# Patient Record
Sex: Male | Born: 1935 | Race: White | Hispanic: No | Smoking: Former smoker
Health system: Southern US, Community
[De-identification: ages and names within clinical notes are randomized; demographics above are authoritative.]

## PROBLEM LIST (undated history)

## (undated) DIAGNOSIS — I739 Peripheral vascular disease, unspecified: Secondary | ICD-10-CM

## (undated) DIAGNOSIS — I25810 Atherosclerosis of coronary artery bypass graft(s) without angina pectoris: Secondary | ICD-10-CM

## (undated) DIAGNOSIS — Z973 Presence of spectacles and contact lenses: Secondary | ICD-10-CM

## (undated) DIAGNOSIS — E7800 Pure hypercholesterolemia, unspecified: Secondary | ICD-10-CM

## (undated) DIAGNOSIS — I779 Disorder of arteries and arterioles, unspecified: Secondary | ICD-10-CM

## (undated) DIAGNOSIS — I214 Non-ST elevation (NSTEMI) myocardial infarction: Secondary | ICD-10-CM

## (undated) DIAGNOSIS — I10 Essential (primary) hypertension: Secondary | ICD-10-CM

## (undated) DIAGNOSIS — M1990 Unspecified osteoarthritis, unspecified site: Secondary | ICD-10-CM

## (undated) DIAGNOSIS — D649 Anemia, unspecified: Secondary | ICD-10-CM

## (undated) DIAGNOSIS — Z972 Presence of dental prosthetic device (complete) (partial): Secondary | ICD-10-CM

## (undated) DIAGNOSIS — E119 Type 2 diabetes mellitus without complications: Secondary | ICD-10-CM

## (undated) DIAGNOSIS — N289 Disorder of kidney and ureter, unspecified: Secondary | ICD-10-CM

## (undated) DIAGNOSIS — Z862 Personal history of diseases of the blood and blood-forming organs and certain disorders involving the immune mechanism: Secondary | ICD-10-CM

## (undated) HISTORY — PX: COLONOSCOPY: SHX174

## (undated) HISTORY — DX: Peripheral vascular disease, unspecified: I73.9

## (undated) HISTORY — PX: OTHER SURGICAL HISTORY: SHX169

## (undated) HISTORY — DX: Personal history of diseases of the blood and blood-forming organs and certain disorders involving the immune mechanism: Z86.2

## (undated) HISTORY — PX: MULTIPLE TOOTH EXTRACTIONS: SHX2053

## (undated) HISTORY — DX: Pure hypercholesterolemia, unspecified: E78.00

## (undated) HISTORY — DX: Disorder of arteries and arterioles, unspecified: I77.9

## (undated) HISTORY — DX: Unspecified osteoarthritis, unspecified site: M19.90

## (undated) HISTORY — DX: Disorder of kidney and ureter, unspecified: N28.9

## (undated) HISTORY — PX: TONSILLECTOMY: SUR1361

## (undated) HISTORY — DX: Essential (primary) hypertension: I10

## (undated) HISTORY — DX: Atherosclerosis of coronary artery bypass graft(s) without angina pectoris: I25.810

## (undated) HISTORY — DX: Non-ST elevation (NSTEMI) myocardial infarction: I21.4

---

## 1998-02-26 HISTORY — PX: CORONARY ARTERY BYPASS GRAFT: SHX141

## 1998-09-14 ENCOUNTER — Encounter: Payer: Self-pay | Admitting: *Deleted

## 1998-09-15 ENCOUNTER — Inpatient Hospital Stay (HOSPITAL_COMMUNITY): Admission: AD | Admit: 1998-09-15 | Discharge: 1998-09-21 | Payer: Self-pay | Admitting: *Deleted

## 1998-09-16 ENCOUNTER — Encounter: Payer: Self-pay | Admitting: Surgery

## 1998-09-17 ENCOUNTER — Encounter: Payer: Self-pay | Admitting: Surgery

## 1998-09-18 ENCOUNTER — Encounter: Payer: Self-pay | Admitting: Cardiothoracic Surgery

## 1998-11-01 ENCOUNTER — Encounter (HOSPITAL_COMMUNITY): Admission: RE | Admit: 1998-11-01 | Discharge: 1999-01-30 | Payer: Self-pay | Admitting: *Deleted

## 2004-01-11 ENCOUNTER — Ambulatory Visit: Payer: Self-pay

## 2004-01-28 ENCOUNTER — Ambulatory Visit: Payer: Self-pay | Admitting: *Deleted

## 2004-03-07 ENCOUNTER — Ambulatory Visit: Payer: Self-pay | Admitting: Cardiology

## 2004-04-19 ENCOUNTER — Ambulatory Visit: Payer: Self-pay | Admitting: *Deleted

## 2004-05-17 ENCOUNTER — Ambulatory Visit: Payer: Self-pay | Admitting: Cardiology

## 2004-06-15 ENCOUNTER — Ambulatory Visit: Payer: Self-pay | Admitting: *Deleted

## 2004-06-20 ENCOUNTER — Ambulatory Visit: Payer: Self-pay | Admitting: *Deleted

## 2004-08-16 ENCOUNTER — Ambulatory Visit: Payer: Self-pay | Admitting: Pulmonary Disease

## 2004-10-04 ENCOUNTER — Ambulatory Visit: Payer: Self-pay | Admitting: *Deleted

## 2004-12-04 ENCOUNTER — Ambulatory Visit: Payer: Self-pay | Admitting: *Deleted

## 2004-12-12 ENCOUNTER — Ambulatory Visit: Payer: Self-pay | Admitting: *Deleted

## 2005-01-09 ENCOUNTER — Ambulatory Visit: Payer: Self-pay | Admitting: Pulmonary Disease

## 2005-02-01 ENCOUNTER — Ambulatory Visit: Payer: Self-pay | Admitting: *Deleted

## 2005-02-06 ENCOUNTER — Ambulatory Visit: Payer: Self-pay | Admitting: Pulmonary Disease

## 2005-04-24 ENCOUNTER — Ambulatory Visit: Payer: Self-pay | Admitting: *Deleted

## 2005-05-03 ENCOUNTER — Ambulatory Visit: Payer: Self-pay | Admitting: *Deleted

## 2005-07-24 ENCOUNTER — Ambulatory Visit: Payer: Self-pay | Admitting: Pulmonary Disease

## 2005-07-25 ENCOUNTER — Ambulatory Visit: Payer: Self-pay | Admitting: Pulmonary Disease

## 2005-11-06 ENCOUNTER — Ambulatory Visit: Payer: Self-pay | Admitting: *Deleted

## 2005-11-27 ENCOUNTER — Ambulatory Visit: Payer: Self-pay | Admitting: Internal Medicine

## 2006-01-02 ENCOUNTER — Ambulatory Visit: Payer: Self-pay | Admitting: *Deleted

## 2006-01-10 ENCOUNTER — Ambulatory Visit: Payer: Self-pay | Admitting: Internal Medicine

## 2006-01-22 ENCOUNTER — Ambulatory Visit: Payer: Self-pay | Admitting: Pulmonary Disease

## 2006-04-22 ENCOUNTER — Ambulatory Visit: Payer: Self-pay | Admitting: Pulmonary Disease

## 2006-04-22 LAB — CONVERTED CEMR LAB
BUN: 29 mg/dL — ABNORMAL HIGH (ref 6–23)
Basophils Absolute: 0.1 10*3/uL (ref 0.0–0.1)
Basophils Relative: 1.1 % — ABNORMAL HIGH (ref 0.0–1.0)
CO2: 29 meq/L (ref 19–32)
Calcium: 9.9 mg/dL (ref 8.4–10.5)
Chloride: 105 meq/L (ref 96–112)
Cholesterol: 126 mg/dL (ref 0–200)
Creatinine, Ser: 1.6 mg/dL — ABNORMAL HIGH (ref 0.4–1.5)
Eosinophils Absolute: 0.5 10*3/uL (ref 0.0–0.6)
Eosinophils Relative: 6.9 % — ABNORMAL HIGH (ref 0.0–5.0)
GFR calc Af Amer: 54 mL/min
GFR calc non Af Amer: 45 mL/min
Glucose, Bld: 84 mg/dL (ref 70–99)
HCT: 27.1 % — ABNORMAL LOW (ref 39.0–52.0)
HDL: 37.1 mg/dL — ABNORMAL LOW (ref >39.0)
Hemoglobin: 9.5 g/dL — ABNORMAL LOW (ref 13.0–17.0)
Hgb A1c MFr Bld: 6.9 % — ABNORMAL HIGH (ref 4.6–6.0)
Iron: 85 ug/dL (ref 42–165)
LDL Cholesterol: 49 mg/dL (ref 0–99)
Lymphocytes Relative: 19.9 % (ref 12.0–46.0)
MCHC: 34.9 g/dL (ref 30.0–36.0)
MCV: 88.2 fL (ref 78.0–100.0)
Monocytes Absolute: 0.9 10*3/uL — ABNORMAL HIGH (ref 0.2–0.7)
Monocytes Relative: 12.4 % — ABNORMAL HIGH (ref 3.0–11.0)
Neutro Abs: 4.3 10*3/uL (ref 1.4–7.7)
Neutrophils Relative %: 59.7 % (ref 43.0–77.0)
Platelets: 196 10*3/uL (ref 150–400)
Potassium: 4.8 meq/L (ref 3.5–5.1)
RBC: 3.07 M/uL — ABNORMAL LOW (ref 4.22–5.81)
RDW: 11.7 % (ref 11.5–14.6)
Saturation Ratios: 25 % (ref 20.0–50.0)
Sodium: 142 meq/L (ref 135–145)
Total CHOL/HDL Ratio: 3.4
Transferrin: 243.2 mg/dL (ref >=212.0)
Triglycerides: 199 mg/dL — ABNORMAL HIGH (ref 0–149)
VLDL: 40 mg/dL (ref 0–40)
WBC: 7.2 10*3/uL (ref 4.5–10.5)

## 2006-04-29 ENCOUNTER — Ambulatory Visit: Payer: Self-pay | Admitting: Pulmonary Disease

## 2006-04-30 ENCOUNTER — Ambulatory Visit: Payer: Self-pay | Admitting: *Deleted

## 2006-05-01 ENCOUNTER — Encounter (HOSPITAL_COMMUNITY): Admission: RE | Admit: 2006-05-01 | Discharge: 2006-07-30 | Payer: Self-pay | Admitting: Pulmonary Disease

## 2006-05-08 ENCOUNTER — Ambulatory Visit: Payer: Self-pay

## 2006-07-26 ENCOUNTER — Ambulatory Visit: Payer: Self-pay | Admitting: *Deleted

## 2006-07-26 LAB — CONVERTED CEMR LAB
ALT: 15 units/L (ref 0–40)
AST: 21 units/L (ref 0–37)
Albumin: 4 g/dL (ref 3.5–5.2)
Alkaline Phosphatase: 48 units/L (ref 39–117)
Bilirubin, Direct: 0.2 mg/dL (ref 0.0–0.3)
Cholesterol: 144 mg/dL (ref 0–200)
HDL: 36.6 mg/dL — ABNORMAL LOW (ref >39.0)
LDL Cholesterol: 78 mg/dL (ref 0–99)
Total Bilirubin: 1.4 mg/dL — ABNORMAL HIGH (ref 0.3–1.2)
Total CHOL/HDL Ratio: 3.9
Total Protein: 7.1 g/dL (ref 6.0–8.3)
Triglycerides: 145 mg/dL (ref 0–149)
VLDL: 29 mg/dL (ref 0–40)

## 2006-07-30 ENCOUNTER — Ambulatory Visit: Payer: Self-pay | Admitting: Pulmonary Disease

## 2006-07-30 LAB — CONVERTED CEMR LAB
Albumin: 3.9 g/dL (ref 3.5–5.2)
BUN: 27 mg/dL — ABNORMAL HIGH (ref 6–23)
Basophils Absolute: 0 10*3/uL (ref 0.0–0.1)
Basophils Relative: 0.5 % (ref 0.0–1.0)
CO2: 30 meq/L (ref 19–32)
Calcium: 9.6 mg/dL (ref 8.4–10.5)
Chloride: 106 meq/L (ref 96–112)
Creatinine, Ser: 1.7 mg/dL — ABNORMAL HIGH (ref 0.4–1.5)
Eosinophils Absolute: 0.4 10*3/uL (ref 0.0–0.6)
Eosinophils Relative: 5.6 % — ABNORMAL HIGH (ref 0.0–5.0)
GFR calc Af Amer: 50 mL/min
GFR calc non Af Amer: 41 mL/min
Glucose, Bld: 121 mg/dL — ABNORMAL HIGH (ref 70–99)
HCT: 42.6 % (ref 39.0–52.0)
Hemoglobin: 14.6 g/dL (ref 13.0–17.0)
Hgb A1c MFr Bld: 4.6 % (ref 4.6–6.0)
Iron: 75 ug/dL (ref 42–165)
Lymphocytes Relative: 22.9 % (ref 12.0–46.0)
MCHC: 34.3 g/dL (ref 30.0–36.0)
MCV: 91.4 fL (ref 78.0–100.0)
Monocytes Absolute: 1.1 10*3/uL — ABNORMAL HIGH (ref 0.2–0.7)
Monocytes Relative: 13.4 % — ABNORMAL HIGH (ref 3.0–11.0)
Neutro Abs: 4.6 10*3/uL (ref 1.4–7.7)
Neutrophils Relative %: 57.6 % (ref 43.0–77.0)
Phosphorus: 3.6 mg/dL (ref 2.3–4.6)
Platelets: 222 10*3/uL (ref 150–400)
Potassium: 4.6 meq/L (ref 3.5–5.1)
RBC: 4.66 M/uL (ref 4.22–5.81)
RDW: 16.3 % — ABNORMAL HIGH (ref 11.5–14.6)
Saturation Ratios: 21 % (ref 20.0–50.0)
Sodium: 142 meq/L (ref 135–145)
Transferrin: 254.5 mg/dL (ref >=212.0)
WBC: 7.9 10*3/uL (ref 4.5–10.5)

## 2006-11-01 ENCOUNTER — Ambulatory Visit: Payer: Self-pay | Admitting: Cardiology

## 2006-11-01 LAB — CONVERTED CEMR LAB
BUN: 39 mg/dL — ABNORMAL HIGH (ref 6–23)
Basophils Absolute: 0 10*3/uL (ref 0.0–0.1)
Basophils Relative: 0.1 % (ref 0.0–1.0)
CO2: 25 meq/L (ref 19–32)
Calcium: 9.6 mg/dL (ref 8.4–10.5)
Chloride: 105 meq/L (ref 96–112)
Creatinine, Ser: 2 mg/dL — ABNORMAL HIGH (ref 0.4–1.5)
Eosinophils Absolute: 0.4 10*3/uL (ref 0.0–0.6)
Eosinophils Relative: 5 % (ref 0.0–5.0)
Ferritin: 259.5 ng/mL (ref 22.0–322.0)
GFR calc Af Amer: 42 mL/min
GFR calc non Af Amer: 34 mL/min
Glucose, Bld: 134 mg/dL — ABNORMAL HIGH (ref 70–99)
HCT: 27.9 % — ABNORMAL LOW (ref 39.0–52.0)
Hemoglobin: 10 g/dL — ABNORMAL LOW (ref 13.0–17.0)
Iron: 99 ug/dL (ref 42–165)
Lymphocytes Relative: 19.2 % (ref 12.0–46.0)
MCHC: 35.9 g/dL (ref 30.0–36.0)
MCV: 94.1 fL (ref 78.0–100.0)
Monocytes Absolute: 1 10*3/uL — ABNORMAL HIGH (ref 0.2–0.7)
Monocytes Relative: 13.5 % — ABNORMAL HIGH (ref 3.0–11.0)
Neutro Abs: 4.3 10*3/uL (ref 1.4–7.7)
Neutrophils Relative %: 62.2 % (ref 43.0–77.0)
Platelets: 202 10*3/uL (ref 150–400)
Potassium: 4.1 meq/L (ref 3.5–5.1)
Pro B Natriuretic peptide (BNP): 47 pg/mL (ref 0.0–100.0)
RBC: 2.96 M/uL — ABNORMAL LOW (ref 4.22–5.81)
RDW: 11.5 % (ref 11.5–14.6)
Saturation Ratios: 29.7 % (ref 20.0–50.0)
Sodium: 137 meq/L (ref 135–145)
Transferrin: 238 mg/dL (ref >=212.0)
WBC: 7.1 10*3/uL (ref 4.5–10.5)

## 2006-11-12 ENCOUNTER — Ambulatory Visit: Payer: Self-pay

## 2006-11-27 ENCOUNTER — Ambulatory Visit: Payer: Self-pay | Admitting: Oncology

## 2006-11-28 ENCOUNTER — Ambulatory Visit: Payer: Self-pay | Admitting: Pulmonary Disease

## 2006-11-29 ENCOUNTER — Ambulatory Visit: Payer: Self-pay

## 2006-12-03 LAB — CONVERTED CEMR LAB
Cholesterol: 137 mg/dL (ref 0–200)
HDL: 34.1 mg/dL — ABNORMAL LOW (ref >39.0)
Hgb A1c MFr Bld: 5.4 % (ref 4.6–6.0)
LDL Cholesterol: 78 mg/dL (ref 0–99)
Total CHOL/HDL Ratio: 4
Triglycerides: 123 mg/dL (ref 0–149)
VLDL: 25 mg/dL (ref 0–40)

## 2007-01-28 ENCOUNTER — Ambulatory Visit: Payer: Self-pay | Admitting: Oncology

## 2007-01-29 LAB — ERYTHROCYTE SEDIMENTATION RATE: Sed Rate: 48 mm/hr — ABNORMAL HIGH (ref 0–20)

## 2007-01-29 LAB — CBC WITH DIFFERENTIAL/PLATELET
BASO%: 0.8 % (ref 0.0–2.0)
EOS%: 5 % (ref 0.0–7.0)
HCT: 30 % — ABNORMAL LOW (ref 38.7–49.9)
MCH: 32.5 pg (ref 28.0–33.4)
MCHC: 36.4 g/dL — ABNORMAL HIGH (ref 32.0–35.9)
NEUT%: 60.7 % (ref 40.0–75.0)
lymph#: 1.4 10*3/uL (ref 0.9–3.3)

## 2007-01-29 LAB — MORPHOLOGY: PLT EST: ADEQUATE

## 2007-01-31 LAB — COMPREHENSIVE METABOLIC PANEL
ALT: 9 U/L (ref 0–53)
CO2: 23 mEq/L (ref 19–32)
Calcium: 9.6 mg/dL (ref 8.4–10.5)
Chloride: 104 mEq/L (ref 96–112)
Sodium: 137 mEq/L (ref 135–145)
Total Bilirubin: 0.5 mg/dL (ref 0.3–1.2)
Total Protein: 7.4 g/dL (ref 6.0–8.3)

## 2007-01-31 LAB — IMMUNOFIXATION ELECTROPHORESIS: IgM, Serum: 82 mg/dL (ref 60–263)

## 2007-01-31 LAB — LACTATE DEHYDROGENASE: LDH: 151 U/L (ref 94–250)

## 2007-02-06 LAB — UIFE/LIGHT CHAINS/TP QN, 24-HR UR
Albumin, U: DETECTED
Alpha 1, Urine: DETECTED — AB
Beta, Urine: DETECTED — AB
Free Lambda Lt Chains,Ur: 0.54 mg/dL (ref 0.08–1.01)
Gamma Globulin, Urine: DETECTED — AB
Total Protein, Urine-Ur/day: 158 mg/d — ABNORMAL HIGH (ref 10–140)
Volume, Urine: 1350 mL

## 2007-02-06 LAB — CREATININE CLEARANCE, URINE, 24 HOUR
Collection Interval-CRCL: 24 hours
Creatinine Clearance: 57 mL/min — ABNORMAL LOW (ref 75–125)
Urine Total Volume-CRCL: 1350 mL

## 2007-03-28 ENCOUNTER — Ambulatory Visit: Payer: Self-pay | Admitting: Oncology

## 2007-04-01 ENCOUNTER — Encounter: Payer: Self-pay | Admitting: Pulmonary Disease

## 2007-04-01 LAB — CBC & DIFF AND RETIC
BASO%: 0.7 % (ref 0.0–2.0)
EOS%: 5.7 % (ref 0.0–7.0)
IRF: 0.22 (ref 0.070–0.380)
MCH: 30.9 pg (ref 28.0–33.4)
MCHC: 34.7 g/dL (ref 32.0–35.9)
NEUT%: 58.9 % (ref 40.0–75.0)
RBC: 3.61 10*6/uL — ABNORMAL LOW (ref 4.20–5.71)
RDW: 12.2 % (ref 11.2–14.6)
RETIC #: 38.6 10*3/uL (ref 31.8–103.9)
lymph#: 1.3 10*3/uL (ref 0.9–3.3)

## 2007-04-02 ENCOUNTER — Encounter: Payer: Self-pay | Admitting: Pulmonary Disease

## 2007-04-04 ENCOUNTER — Ambulatory Visit: Payer: Self-pay | Admitting: Pulmonary Disease

## 2007-04-04 DIAGNOSIS — E7800 Pure hypercholesterolemia, unspecified: Secondary | ICD-10-CM

## 2007-04-04 DIAGNOSIS — N259 Disorder resulting from impaired renal tubular function, unspecified: Secondary | ICD-10-CM | POA: Insufficient documentation

## 2007-04-04 DIAGNOSIS — I10 Essential (primary) hypertension: Secondary | ICD-10-CM

## 2007-04-04 DIAGNOSIS — D638 Anemia in other chronic diseases classified elsewhere: Secondary | ICD-10-CM

## 2007-04-04 DIAGNOSIS — M1990 Unspecified osteoarthritis, unspecified site: Secondary | ICD-10-CM | POA: Insufficient documentation

## 2007-04-04 DIAGNOSIS — E119 Type 2 diabetes mellitus without complications: Secondary | ICD-10-CM | POA: Insufficient documentation

## 2007-04-13 LAB — CONVERTED CEMR LAB
ALT: 14 units/L (ref 0–53)
AST: 18 units/L (ref 0–37)
Albumin: 4.2 g/dL (ref 3.5–5.2)
Alkaline Phosphatase: 59 units/L (ref 39–117)
BUN: 18 mg/dL (ref 6–23)
Bilirubin, Direct: 0.2 mg/dL (ref 0.0–0.3)
CO2: 29 meq/L (ref 19–32)
Calcium: 9.8 mg/dL (ref 8.4–10.5)
Chloride: 102 meq/L (ref 96–112)
Cholesterol: 169 mg/dL (ref 0–200)
Creatinine, Ser: 1.6 mg/dL — ABNORMAL HIGH (ref 0.4–1.5)
Direct LDL: 96.2 mg/dL
GFR calc Af Amer: 54 mL/min
GFR calc non Af Amer: 44 mL/min
Glucose, Bld: 118 mg/dL — ABNORMAL HIGH (ref 70–99)
HDL: 37.4 mg/dL — ABNORMAL LOW (ref >39.0)
Hgb A1c MFr Bld: 6.4 % — ABNORMAL HIGH (ref 4.6–6.0)
Potassium: 4.6 meq/L (ref 3.5–5.1)
Sodium: 138 meq/L (ref 135–145)
Total Bilirubin: 0.8 mg/dL (ref 0.3–1.2)
Total CHOL/HDL Ratio: 4.5
Total Protein: 7.5 g/dL (ref 6.0–8.3)
Triglycerides: 206 mg/dL (ref 0–149)
VLDL: 41 mg/dL — ABNORMAL HIGH (ref 0–40)

## 2007-04-22 ENCOUNTER — Telehealth (INDEPENDENT_AMBULATORY_CARE_PROVIDER_SITE_OTHER): Payer: Self-pay | Admitting: *Deleted

## 2007-05-07 ENCOUNTER — Ambulatory Visit: Payer: Self-pay | Admitting: Cardiology

## 2007-08-07 ENCOUNTER — Ambulatory Visit: Payer: Self-pay | Admitting: Pulmonary Disease

## 2007-08-10 LAB — CONVERTED CEMR LAB
ALT: 14 units/L (ref 0–53)
AST: 19 units/L (ref 0–37)
Albumin: 4.3 g/dL (ref 3.5–5.2)
BUN: 31 mg/dL — ABNORMAL HIGH (ref 6–23)
Basophils Absolute: 0.1 10*3/uL (ref 0.0–0.1)
Basophils Relative: 1 % (ref 0.0–1.0)
CO2: 27 meq/L (ref 19–32)
Calcium: 9.7 mg/dL (ref 8.4–10.5)
Chloride: 106 meq/L (ref 96–112)
Cholesterol: 140 mg/dL (ref 0–200)
Creatinine, Ser: 1.6 mg/dL — ABNORMAL HIGH (ref 0.4–1.5)
Eosinophils Absolute: 0.3 10*3/uL (ref 0.0–0.7)
Eosinophils Relative: 4.7 % (ref 0.0–5.0)
Hemoglobin: 11 g/dL — ABNORMAL LOW (ref 13.0–17.0)
Iron: 59 ug/dL (ref 42–165)
Lymphocytes Relative: 18.7 % (ref 12.0–46.0)
MCHC: 34.1 g/dL (ref 30.0–36.0)
MCV: 92.1 fL (ref 78.0–100.0)
Neutro Abs: 3.8 10*3/uL (ref 1.4–7.7)
Neutrophils Relative %: 59.6 % (ref 43.0–77.0)
RBC: 3.5 M/uL — ABNORMAL LOW (ref 4.22–5.81)
Total Bilirubin: 0.6 mg/dL (ref 0.3–1.2)
Transferrin: 228.3 mg/dL (ref >=212.0)
WBC: 6.4 10*3/uL (ref 4.5–10.5)

## 2007-08-28 ENCOUNTER — Telehealth (INDEPENDENT_AMBULATORY_CARE_PROVIDER_SITE_OTHER): Payer: Self-pay | Admitting: *Deleted

## 2007-11-13 ENCOUNTER — Ambulatory Visit: Payer: Self-pay | Admitting: Cardiology

## 2007-11-13 LAB — CONVERTED CEMR LAB
AST: 22 units/L (ref 0–37)
Alkaline Phosphatase: 63 units/L (ref 39–117)
Bilirubin, Direct: 0.2 mg/dL (ref 0.0–0.3)
Chloride: 107 meq/L (ref 96–112)
Eosinophils Relative: 4.1 % (ref 0.0–5.0)
GFR calc Af Amer: 54 mL/min
Glucose, Bld: 111 mg/dL — ABNORMAL HIGH (ref 70–99)
HDL: 36.9 mg/dL — ABNORMAL LOW (ref >39.0)
LDL Cholesterol: 85 mg/dL (ref 0–99)
Lymphocytes Relative: 22.2 % (ref 12.0–46.0)
Monocytes Absolute: 0.9 10*3/uL (ref 0.1–1.0)
Monocytes Relative: 15.2 % — ABNORMAL HIGH (ref 3.0–12.0)
Neutrophils Relative %: 57.6 % (ref 43.0–77.0)
Platelets: 161 10*3/uL (ref 150–400)
Potassium: 4.5 meq/L (ref 3.5–5.1)
RDW: 12.3 % (ref 11.5–14.6)
Saturation Ratios: 26.4 % (ref 20.0–50.0)
Sodium: 140 meq/L (ref 135–145)
Total CHOL/HDL Ratio: 4.1
Total Protein: 7.5 g/dL (ref 6.0–8.3)
Triglycerides: 152 mg/dL — ABNORMAL HIGH (ref 0–149)
VLDL: 30 mg/dL (ref 0–40)
WBC: 6.1 10*3/uL (ref 4.5–10.5)

## 2007-12-09 ENCOUNTER — Ambulatory Visit: Payer: Self-pay | Admitting: Pulmonary Disease

## 2007-12-24 ENCOUNTER — Encounter: Payer: Self-pay | Admitting: Pulmonary Disease

## 2007-12-24 ENCOUNTER — Ambulatory Visit: Payer: Self-pay

## 2008-03-08 ENCOUNTER — Encounter: Payer: Self-pay | Admitting: Pulmonary Disease

## 2008-06-08 ENCOUNTER — Ambulatory Visit: Payer: Self-pay | Admitting: Pulmonary Disease

## 2008-06-08 LAB — CONVERTED CEMR LAB: Vit D, 25-Hydroxy: 36 ng/mL (ref 30–89)

## 2008-06-13 LAB — CONVERTED CEMR LAB
Alkaline Phosphatase: 56 units/L (ref 39–117)
Basophils Absolute: 0 10*3/uL (ref 0.0–0.1)
Bilirubin, Direct: 0.1 mg/dL (ref 0.0–0.3)
CO2: 26 meq/L (ref 19–32)
Calcium: 9.6 mg/dL (ref 8.4–10.5)
Creatinine, Ser: 1.6 mg/dL — ABNORMAL HIGH (ref 0.4–1.5)
Eosinophils Absolute: 0.2 10*3/uL (ref 0.0–0.7)
Folate: >20 ng/mL
Glucose, Bld: 106 mg/dL — ABNORMAL HIGH (ref 70–99)
HDL: 35.5 mg/dL — ABNORMAL LOW (ref >39.00)
Iron: 77 ug/dL (ref 42–165)
Lymphocytes Relative: 27 % (ref 12.0–46.0)
MCHC: 33.7 g/dL (ref 30.0–36.0)
Microalb Creat Ratio: 116.9 mg/g — ABNORMAL HIGH (ref 0.0–30.0)
Neutrophils Relative %: 54.1 % (ref 43.0–77.0)
Pro B Natriuretic peptide (BNP): 124 pg/mL — ABNORMAL HIGH (ref 0.0–100.0)
RDW: 11.8 % (ref 11.5–14.6)
Saturation Ratios: 22.3 % (ref 20.0–50.0)
Total Bilirubin: 0.8 mg/dL (ref 0.3–1.2)
Total CHOL/HDL Ratio: 4
Triglycerides: 145 mg/dL (ref 0.0–149.0)
VLDL: 29 mg/dL (ref 0.0–40.0)

## 2008-07-13 DIAGNOSIS — I2510 Atherosclerotic heart disease of native coronary artery without angina pectoris: Secondary | ICD-10-CM

## 2008-07-14 ENCOUNTER — Ambulatory Visit: Payer: Self-pay | Admitting: Cardiology

## 2008-12-07 ENCOUNTER — Ambulatory Visit: Payer: Self-pay | Admitting: Pulmonary Disease

## 2008-12-08 LAB — CONVERTED CEMR LAB
Basophils Absolute: 0.1 10*3/uL (ref 0.0–0.1)
Calcium: 9.6 mg/dL (ref 8.4–10.5)
GFR calc non Af Amer: 44.14 mL/min (ref >60)
Glucose, Bld: 119 mg/dL — ABNORMAL HIGH (ref 70–99)
HDL: 37.8 mg/dL — ABNORMAL LOW (ref >39.00)
Hemoglobin: 10.3 g/dL — ABNORMAL LOW (ref 13.0–17.0)
Hgb A1c MFr Bld: 6.5 % (ref 4.6–6.5)
Iron: 88 ug/dL (ref 42–165)
Lymphocytes Relative: 22.1 % (ref 12.0–46.0)
Monocytes Relative: 16 % — ABNORMAL HIGH (ref 3.0–12.0)
Neutro Abs: 3.5 10*3/uL (ref 1.4–7.7)
Neutrophils Relative %: 58.6 % (ref 43.0–77.0)
Platelets: 147 10*3/uL — ABNORMAL LOW (ref 150.0–400.0)
RDW: 12.1 % (ref 11.5–14.6)
Saturation Ratios: 28.4 % (ref 20.0–50.0)
Sodium: 140 meq/L (ref 135–145)
Total Bilirubin: 0.8 mg/dL (ref 0.3–1.2)
Total CHOL/HDL Ratio: 4
Transferrin: 221.4 mg/dL (ref 212.0–360.0)
VLDL: 26 mg/dL (ref 0.0–40.0)

## 2008-12-27 ENCOUNTER — Encounter: Payer: Self-pay | Admitting: Pulmonary Disease

## 2008-12-27 DIAGNOSIS — I6529 Occlusion and stenosis of unspecified carotid artery: Secondary | ICD-10-CM

## 2008-12-28 ENCOUNTER — Encounter: Payer: Self-pay | Admitting: Pulmonary Disease

## 2008-12-28 ENCOUNTER — Ambulatory Visit: Payer: Self-pay

## 2009-01-03 ENCOUNTER — Encounter: Payer: Self-pay | Admitting: Cardiology

## 2009-01-04 ENCOUNTER — Ambulatory Visit: Payer: Self-pay | Admitting: Cardiology

## 2009-01-11 ENCOUNTER — Ambulatory Visit: Payer: Self-pay

## 2009-01-11 ENCOUNTER — Encounter: Payer: Self-pay | Admitting: Cardiology

## 2009-05-09 ENCOUNTER — Encounter: Payer: Self-pay | Admitting: Pulmonary Disease

## 2009-05-11 ENCOUNTER — Encounter: Payer: Self-pay | Admitting: Pulmonary Disease

## 2009-06-01 ENCOUNTER — Ambulatory Visit: Payer: Self-pay | Admitting: Cardiology

## 2009-06-07 ENCOUNTER — Ambulatory Visit: Payer: Self-pay | Admitting: Pulmonary Disease

## 2009-06-14 ENCOUNTER — Ambulatory Visit: Payer: Self-pay | Admitting: Pulmonary Disease

## 2009-06-19 LAB — CONVERTED CEMR LAB
ALT: 15 units/L (ref 0–53)
AST: 17 units/L (ref 0–37)
Alkaline Phosphatase: 55 units/L (ref 39–117)
Basophils Absolute: 0 10*3/uL (ref 0.0–0.1)
Basophils Relative: 0.7 % (ref 0.0–3.0)
Bilirubin, Direct: 0.1 mg/dL (ref 0.0–0.3)
CO2: 30 meq/L (ref 19–32)
Chloride: 108 meq/L (ref 96–112)
Cholesterol: 138 mg/dL (ref 0–200)
Creatinine, Ser: 1.9 mg/dL — ABNORMAL HIGH (ref 0.4–1.5)
Eosinophils Absolute: 0.5 10*3/uL (ref 0.0–0.7)
HDL: 40.3 mg/dL (ref >39.00)
Hgb A1c MFr Bld: 6.6 % — ABNORMAL HIGH (ref 4.6–6.5)
MCHC: 35 g/dL (ref 30.0–36.0)
MCV: 89.1 fL (ref 78.0–100.0)
Monocytes Absolute: 1 10*3/uL (ref 0.1–1.0)
Neutrophils Relative %: 58.3 % (ref 43.0–77.0)
Potassium: 4.3 meq/L (ref 3.5–5.1)
RBC: 3.49 M/uL — ABNORMAL LOW (ref 4.22–5.81)
RDW: 12.7 % (ref 11.5–14.6)
Total Bilirubin: 0.6 mg/dL (ref 0.3–1.2)
Triglycerides: 87 mg/dL (ref 0.0–149.0)
VLDL: 17.4 mg/dL (ref 0.0–40.0)

## 2009-07-29 ENCOUNTER — Ambulatory Visit: Payer: Self-pay | Admitting: Cardiovascular Disease

## 2009-10-28 ENCOUNTER — Encounter: Payer: Self-pay | Admitting: Cardiology

## 2009-11-08 ENCOUNTER — Encounter: Payer: Self-pay | Admitting: Cardiology

## 2009-11-17 ENCOUNTER — Telehealth: Payer: Self-pay | Admitting: Cardiology

## 2009-12-02 ENCOUNTER — Ambulatory Visit: Payer: Self-pay | Admitting: Cardiology

## 2009-12-02 DIAGNOSIS — I70219 Atherosclerosis of native arteries of extremities with intermittent claudication, unspecified extremity: Secondary | ICD-10-CM

## 2009-12-29 ENCOUNTER — Telehealth (INDEPENDENT_AMBULATORY_CARE_PROVIDER_SITE_OTHER): Payer: Self-pay | Admitting: *Deleted

## 2010-01-02 ENCOUNTER — Ambulatory Visit: Payer: Self-pay | Admitting: Pulmonary Disease

## 2010-01-03 ENCOUNTER — Ambulatory Visit: Payer: Self-pay | Admitting: Pulmonary Disease

## 2010-01-05 LAB — CONVERTED CEMR LAB
Basophils Relative: 1 % (ref 0.0–3.0)
Bilirubin, Direct: 0.1 mg/dL (ref 0.0–0.3)
Calcium: 9.6 mg/dL (ref 8.4–10.5)
Creatinine, Ser: 2.1 mg/dL — ABNORMAL HIGH (ref 0.4–1.5)
Eosinophils Absolute: 0.6 10*3/uL (ref 0.0–0.7)
HCT: 34.1 % — ABNORMAL LOW (ref 39.0–52.0)
HDL: 34.2 mg/dL — ABNORMAL LOW (ref >39.00)
Hemoglobin: 11.6 g/dL — ABNORMAL LOW (ref 13.0–17.0)
LDL Cholesterol: 81 mg/dL (ref 0–99)
MCHC: 34 g/dL (ref 30.0–36.0)
MCV: 91.9 fL (ref 78.0–100.0)
Monocytes Absolute: 1 10*3/uL (ref 0.1–1.0)
Neutro Abs: 4.1 10*3/uL (ref 1.4–7.7)
PSA: 1.47 ng/mL (ref 0.10–4.00)
RBC: 3.71 M/uL — ABNORMAL LOW (ref 4.22–5.81)
Total Bilirubin: 0.5 mg/dL (ref 0.3–1.2)
Total CHOL/HDL Ratio: 4
Triglycerides: 161 mg/dL — ABNORMAL HIGH (ref 0.0–149.0)

## 2010-02-07 ENCOUNTER — Ambulatory Visit: Payer: Self-pay | Admitting: Cardiovascular Disease

## 2010-03-30 NOTE — Assessment & Plan Note (Signed)
Summary: followup//lmr   Primary Care Provider:  dr Lenna Gilford  CC:  7 month ROV & review of mult medical problems....  History of Present Illness: 77 y/o WM here for a follow up visit... he has mult med problems as listed and is followed by DrBBrodie and DrPerry as well... he gets his meds filled at the [facility] which makes for a complicated array of perscribed meds and substituted alternatives...   ~  December 07, 2008:  saw DrBB for Cards f/u 5/10- doing satis, no changes made... today c/o some leg cramps at night & we discussed the Tonic water/ Mustard/ Soap bar tricks... he has PVD and legs also "tighten up" while walking- stable pattern, no worsening, he continues to exercise w/ treadmill etc...  some incr stress caring for wife w/ memory problems... OK Flu shot today.   ~  June 07, 2009:  true to form the [place] has changed his Metformin to Glyburide- initially 5mg /d then had to decr to 1/2 tab Qam due to side effects... he notes sugars are "up & down" at home> he pays $9 per Rx at [facility] I encouraged him to look into the [facility]/ [facility] $4 lists... for now we will continue the [place] meds so as not to confuse &/ or conflict the pt any further... he saw DrBBrodie4/11- stable, no angina... referred to [facility] [name] for f/u PVD> art dopplers 11/10 w/ >50% SFA stenosis & ABI's 0.9.Marland Kitchen. he continues on exercise program w/ stable claud symptoms...   ~  January 02, 2010:  he saw Podiatry 9/11 w/ painful burning feet (likely DM neuropathy) & they sent him to [facility] for Art Dopplers re-demontrating his known PAD (ABIs sl worse) w/ hx long standing stable claud symptoms (NOTE: he is followed by [name] Q52mo for Le[facility] PV service & has f/u 12/11)... we discussed trial Neurontin for the burning but he wants tothink about it- alternatively we could send him to Neurology for further eval... he saw DrBB for another Cards f/u 10/11- remains stable & no changes made...  he notes that the [facility] changed his Simva40 to [place] f/u FLP looks  good...  he states that BS continued to drop <50 on the Glipizide2.5mg  from the [facility] so he stopped this med & f/u FBS=149, A1c=6.7 on diet alone (can't take M<etformin  due to renal insuffic)...   Current Problems:  HYPERTENSION (ICD-401.9) - controlled on 4 meds:  METOPROLOL 25mg Bid, AMLODIPINE 10mg /d, LISINOPRIL 40mg /d, and HCT 25mg /d... BP=110/58 and doing well without side effects... denies HA, fatigue, visual changes, CP, palipit, dizziness, syncope, dyspnea, edema, etc...  ~  2DEcho 1996 showed mild LVH, AoV sclerosis & mild AI, EF= 60%...  ~  labs 11/11 showed BUN=36, Creat=2.1, K=5.1.Marland KitchenMarland Kitchen rec to stop HCTZ  ARTERIOSCLEROTIC HEART DISEASE (ICD-414.00) - on ASA 325mg /d... long time pt of DrJoeLeBauer & followed by DrBrodie now... s/p CABG in 2000 by DrBartle (for L main dis)... he is active & w/o angina, palpit, change in SOB/ DOE, etc...  ~  NuclearStressTest 9/08 was normal without ischemia and EF=68%...  PERIPHERAL VASCULAR DISEASE (ICD-443.9) - on ASA daily and he has bilat femoral bruits... he has seen DrDowney in the past (now [name]) w/ exerc program recommended... he walks regularly w/ stable claud symptoms, no sores or lesions on LE's...  ~  ABI's 10/08 were normal bilat = 1.0.Marland KitchenMarland Kitchen waveforms were biphasic however (no change from 2005).  ~  CDoppler's 10/08 showed mild bilat carotid dis w/ 40-59% RICA stenosis, and [id number] LICA  stenosis.  ~  CDoppler's 10/09 showed the same= stable.  ~  CDoppler's 11/10 showed stable mild carotid dis bilat> [id number] RICA & [id number] LICA stenoses.  ~  ABI's 11/10 showed prob mod severe >50% distal right SFA stenosis, & mod dis in left SFA, ABI's=0.9  ~  Podiarty sent him to [facility] for ArtDopplers 9/11> ABIs 0.82 R & 0.74L (see report).  HYPERCHOLESTEROLEMIA (ICD-272.0) - prev controlled on Simva40 + diet, [facility] CRESTOR 10mg /d.  ~  [place] 2/09 on Simva40 showed Tchol 169, TG 206, HDL 37, LDL 96  ~  FLP 4/10 on Simva40 showed TChol 146, TG 145, HDL  36, LDL 82  ~  FLP 10/10 on Simva40 showed TChol 166, TG 130, HDL 38, LDL 102  ~  FLP 4/11 on Simva40 showed TChol 138, TG 87, HDL 40, LDL 80... [facility] subseq ch to Cres10  ~  FLP 11/11 on Cres10 showed TChol 147, TG 161, HDL 34, LDL 81  DIABETES MELLITUS (ICD-250.00) - he's struggled w/ diet Rx... wt stable  ~175 range... prev on Metformin 500Bid but [facility] changed to GLYBURIDE 5mg /d, then decr to 1/2 tab daily due to side effects & he stopped due to hypogly.  ~  labs 10/08 showed FBS= 134, and HgA1c= 5.4...  ~  labs 6/09 showed BS= 101, HgA1c= 6.4.Marland KitchenMarland Kitchen monofilament test is normal.  ~  Eye eval 1/10 at [facility] showed no DM retinopathy...  ~  labs 4/10 on MetformBid showed BS= 106, A1c= 6.8, Urine Microalb= sl incr @ 7.8mg %  ~  labs 10/10 showed BS= 119, A1c= 6.5...  NOTE: 2/11 [facility] ch to Glybur5mg - 1/2 daily.  ~  labs 4/11 on Glyb2.5 showed BS= 97, A1c= 6.6.Marland KitchenMarland Kitchen pt subseq stopped Glybur due to low sugars.  ~  labs 11/11 off meds showed BS= 149, A1c= 6.7.Marland KitchenMarland Kitchen coninue diet Rx.  RENAL INSUFFICIENCY (ICD-588.9) - Creat 1.8-2.0 in the past...  ~  labs 6/09 showed BUN= 31, Creat= 1.6...  ~  labs 9/09 showed BUN= 25, Creat= 1.6, K= 4.5  ~  labs 4/10 showed BUN= 26, Creat= 1.6, K= 5.0  ~  labs 10/10 showed BUN= 21, Creat= 1.6  ~  labs 4/11 showed BUN= 30, Creat= 1.9..Marland Kitchen advised to stay well hydrated.  ~  labs 11/11 showed BUN= 36, Creat= 2.1.Marland KitchenMarland Kitchen rec to stop the HCTZ  DEGENERATIVE JOINT DISEASE (ICD-715.90)  Hx of ANEMIA OF CHRONIC DISEASE (ICD-285.29) - eval by DrGranfortuna w/ ACD secondary to his renal insuffic and Rx w/ aranesp periodically... nothing else found on his extensive eval... he was taking Ferrous Gluconate daily (it no longer makes him tired)...  ~  labs 2/09 w/ Hg= 11.2.Marland Kitchen.  ~  labs 6/09 w/ Hg= 11.0.Marland Kitchen. rec> OTC Fe prep but he never got it!  ~  labs 9/09 showed Hg= 10.5, Fe= 81  ~  labs 4/10 showed Hg= 10.5, MCV= 92, Fe= 77 (22%sat), B12= 590, Folate>20... rec> OTC Fe daily.  ~  labs 10/10 showed Hg=  10.3, Fe= 88  ~  labs 4/11 showed Hg= 10.9,  Fe= 58... continue Ferrous Gluc daily w/ VitC  ~  labs 11/11 showed Hg= 11.6   Preventive Screening-Counseling & Management  Alcohol-Tobacco     Smoking Status: quit     Packs/Day: 1.0     Year Started: 1945     Year Quit: 1955  Allergies (verified): No Known Drug Allergies  Comments:  Nurse/Medical Assistant: The patient's medications and allergies were reviewed with the patient and  were updated in the Medication and Allergy Lists.  Past History:  Past Medical History: HYPERTENSION (ICD-401.9) CAD, AUTOLOGOUS BYPASS GRAFT (ICD-414.02) CAROTID ARTERY DISEASE (ICD-433.10) PVD WITH CLAUDICATION (ICD-443.89) HYPERCHOLESTEROLEMIA (ICD-272.0) DIABETES MELLITUS (ICD-250.00) RENAL INSUFFICIENCY (ICD-588.9) DEGENERATIVE JOINT DISEASE (ICD-715.90) Hx of ANEMIA OF CHRONIC DISEASE (ICD-285.29)  1. Coronary artery disease, status post coronary artery bypass graft     surgery in 2000, now stable. 2. Good left ventricular function. 3. Hypertension, under good control. 4. Hyperlipidemia. 5. Diabetes. 6. History of anemia. 7. Peripheral vascular disease  Past Surgical History: S/P cataract surgery S/P CABG 2000 by DrBartle  Family History: Reviewed history from 08/07/2007 and no changes required. mother died age 57  father died age 13 2 siblings 1 sister died age 48 1 sister alive age 73  Social History: Reviewed history from 06/08/2008 and no changes required. retired drinks one drink per day married 5 children Former Smoker--quit smoking in 1955--1 ppd--smoked for 10 years Packs/Day:  1.0  Review of Systems      See HPI       The patient complains of decreased hearing, dyspnea on exertion, and difficulty walking.  The patient denies anorexia, fever, weight loss, weight gain, vision loss, hoarseness, chest pain, syncope, peripheral edema, prolonged cough, headaches, hemoptysis, abdominal pain, melena, hematochezia,  severe indigestion/heartburn, hematuria, incontinence, muscle weakness, suspicious skin lesions, transient blindness, depression, unusual weight change, abnormal bleeding, enlarged lymph nodes, and angioedema.    Vital Signs:  Patient profile:   77 year old male Height:      69 inches Weight:      171 pounds O2 Sat:      99 % on Room air Temp:     97.8 degrees F oral Pulse rate:   66 / minute BP sitting:   110 / 58  (left arm) Cuff size:   large  Vitals Entered By: Elita Boone CMA (January 02, 2010 9:47 AM)  O2 Sat at Rest %:  99 O2 Flow:  Room air CC: 7 month ROV & review of mult medical problems... Is Patient Diabetic? Yes Pain Assessment Patient in pain? no      Comments meds updated today with pt   Physical Exam  Additional Exam:   WD, WN, 77 y/o WM in NAD... GENERAL:  Alert & oriented; pleasant & cooperative... HEENT:  [place]/AT, EOM-wnl, PERRLA, EACs-clear, TMs-wnl, NOSE-clear, THROAT-clear & wnl. NECK:  Supple w/ fairROM; no JVD; normal carotid impulses w/ faint right bruit; no thyromegaly or nodules palpated; no lymphadenopathy. CHEST:  Clear to P & A; without wheezes/ rales/ or rhonchi heard... HEART:  Regular, gr 1/6 SEM without rubs or gallops heard... ABDOMEN:  Soft & nontender; normal bowel sounds; no organomegaly or masses detected. EXT: without deformities, mod arthritic changes; no varicose veins/ venous insuffic/ or edema. he has bilat fem art bruits & minor pressure area betw right 4th & 5th toes... NEURO:  CN's intact;  no focal neuro deficits... DERM:  No lesions noted; no rash etc...    MISC. Report  Procedure date:  01/02/2010  Findings:      Lipid Panel (LIPID)   Cholesterol               147 mg/dL                   0-200   Triglycerides        [H]  161.0 mg/dL  0.0-149.0   HDL                  [L]  34.20 mg/dL                 >39.00   LDL Cholesterol           81 mg/dL                    0-99  BMP (METABOL)   Sodium                     138 mEq/L                   135-145   Potassium                 5.1 mEq/L                   3.5-5.1   Chloride                  103 mEq/L                   96-112   Carbon Dioxide            28 mEq/L                    19-32   Glucose              [H]  149 mg/dL                   70-99   BUN                  [H]  36 mg/dL                    6-23   Creatinine           [H]  2.1 mg/dL                   0.4-1.5   Calcium                   9.6 mg/dL                   8.4-10.5   GFR                       32.52 mL/min                >60  Hepatic/Liver Function Panel (HEPATIC)   Total Bilirubin           0.5 mg/dL                   0.3-1.2   Direct Bilirubin          0.1 mg/dL                   0.0-0.3   Alkaline Phosphatase      64 U/L                      39-117   AST                       15 U/L  0-37   ALT                       12 U/L                      0-53   Total Protein             6.9 g/dL                    6.0-8.3   Albumin                   4.1 g/dL                    3.5-5.2  Comments:      CBC Platelet w/Diff (CBCD)   White Cell Count          7.4 K/uL                    4.5-10.5   Red Cell Count       [L]  3.71 Mil/uL                 4.22-5.81   Hemoglobin           [L]  11.6 g/dL                   13.0-17.0   Hematocrit           [L]  34.1 %                      39.0-52.0   MCV                       91.9 fl                     78.0-100.0   Platelet Count            179.0 K/uL                  150.0-400.0   Neutrophil %              56.1 %                      43.0-77.0   Lymphocyte %              20.9 %                      12.0-46.0   Monocyte %           [H]  14.2 %                      3.0-12.0   Eosinophils%         [H]  7.8 %                       0.0-5.0   Basophils %               1.0 %                       0.0-3.0  TSH (TSH)   FastTSH                   3.57 uIU/mL  0.35-5.50  Hemoglobin A1C (A1C)   Hemoglobin A1C        [H]  6.7 %                       4.6-6.5  Prostate Specific Antigen (PSA)   PSA-Hyb                   1.47 ng/mL                  0.10-4.00   Impression & Recommendations:  Problem # 1:  HYPERTENSION, BENIGN (ICD-401.1) Controlled & we are stopping the HCTZ due to worsening renal function... The following medications were removed from the medication list:    Hydrochlorothiazide 25 Mg Tabs (Hydrochlorothiazide) .Marland Kitchen... Take 1 by mouth once daily His updated medication list for this problem includes:    Metoprolol Tartrate 25 Mg Tabs (Metoprolol tartrate) .Marland Kitchen... Take 1 by mouth two times a day    Amlodipine Besylate 10 Mg Tabs (Amlodipine besylate) .Marland Kitchen... Take 1 by mouth once daily    Lisinopril 40 Mg Tabs (Lisinopril) .Marland Kitchen... Take 1 by mouth once daily  Problem # 2:  ARTERIOSCLEROTIC HEART DISEASE (ICD-414.00) Followed by DrBB & [name] for Cards... continue his RX... The following medications were removed from the medication list:    Hydrochlorothiazide 25 Mg Tabs (Hydrochlorothiazide) .Marland Kitchen... Take 1 by mouth once daily His updated medication list for this problem includes:    Ecotrin 325 Mg Tbec (Aspirin) .Marland Kitchen... Take 1 by mouth once daily    Metoprolol Tartrate 25 Mg Tabs (Metoprolol tartrate) .Marland Kitchen... Take 1 by mouth two times a day    Amlodipine Besylate 10 Mg Tabs (Amlodipine besylate) .Marland Kitchen... Take 1 by mouth once daily    Lisinopril 40 Mg Tabs (Lisinopril) .Marland Kitchen... 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 continue ASA, risk factor reduction & exerc program...  Problem # 4:  HYPERCHOLESTEROLEMIA (ICD-272.0) [facility] Cres10 [facility] & FLP looks good... His updated medication list for this problem includes:    Crestor 10 Mg Tabs (Rosuvastatin calcium) .Marland Kitchen... 1 tab once daily  Problem # 5:  DIABETES MELLITUS (ICD-250.00) He is now off oral DM meds on diet alone & A1c= 6.7... The following medications were  removed from the medication list:    Glyburide 5 Mg Tabs (Glyburide) .Marland Kitchen... Take one-half  tab by mouth once daily His updated medication list for this problem includes:    Ecotrin 325 Mg Tbec (Aspirin) .Marland Kitchen... Take 1 by mouth once daily    Lisinopril 40 Mg Tabs (Lisinopril) .Marland Kitchen... Take 1 by mouth once daily  Problem # 6:  RENAL INSUFFICIENCY (ICD-588.9) We discussed stopping the HCTZ & maintain hypration...  Problem # 7:  OTHER MEDICAL PROBLEMS AS NOTED>>>  Complete Medication List: 1)  Ecotrin 325 Mg Tbec (Aspirin) .... Take 1 by mouth once daily 2)  Metoprolol Tartrate 25 Mg Tabs (Metoprolol tartrate) .... Take 1 by mouth two times a day 3)  Amlodipine Besylate 10 Mg Tabs (Amlodipine besylate) .... Take 1 by mouth once daily 4)  Lisinopril 40 Mg Tabs (Lisinopril) .... Take 1 by mouth once daily 5)  Crestor 10 Mg Tabs (Rosuvastatin calcium) .Marland Kitchen.. 1 tab once daily 6)  Centrum Silver Tabs (Multiple vitamins-minerals) .... Take 1 by mouth once daily 7)  Ferrous Gluconate 325 Mg Tabs (Ferrous gluconate) .... Take 1 tablet by mouth once a day 8)  Vitamin C 500 Mg Chew (Ascorbic acid) .... Take 1 by mouth once daily  Patient Instructions: 1)  Today we updated your med list- see below.... 2)  Continue your current meds the same for now... 3)  We discussed starting GABAPENTIN for your burning feet (neuropathy)... alternatively we could refer you to the Neurologists for further evaluation... let me know what you decide. 4)  Please return to our lab one morning this week for your FASTING blood work... then please call the "phone tree" in a few days for your lab results.Marland KitchenMarland Kitchen  5)  Call for any questions.Marland KitchenMarland Kitchen 6)  Keep your 12/11 appt w/ [name]... 7)  Please schedule a follow-up appointment in 6 months.   Immunization History:  Influenza Immunization History:    Influenza:  historical (12/05/2009)

## 2010-03-30 NOTE — Letter (Signed)
Summary: Self-Recorded Meds  Self-Recorded Meds   Imported By: Marilynne Drivers 12/16/2009 14:36:06  _____________________________________________________________________  External Attachment:    Type:   Image     Comment:   External Document

## 2010-03-30 NOTE — Assessment & Plan Note (Signed)
Summary: f60m   Visit Type:  Follow-up Primary Shamikia Linskey:  dr Lenna Gilford  CC:  no cardiac complaints.  History of Present Illness: Mr. Henry [name] is 77 years old and return for management of CAD. He had bypass surgery in 2000. He has done well since that time and has had no recent chest pain shortness of breath or palpitations.  He also has peripheral vascular disease. He had previous indices of 0.9 and 0.9. He saw Dr. Burt Knack and his symptoms are not disabling and he recommended medical therapy.  Since that time he complains of burning on the back of his feet only when he has his shoes on. [facility] sooner as needed repeat Dopplers through [facility] heart and vascular M.D. shows indices of 0.8 to on the right and 0.74 on the left. In addition to the burning on the back of the feet he does have pain in his calves when he walks or walks up a hill. The burning symptoms are more problem to him than the calf pain.  Current Medications (verified): 1)  Ecotrin 325 Mg  Tbec (Aspirin) .... Take 1 By Mouth Once Daily 2)  Metoprolol Tartrate 25 Mg  Tabs (Metoprolol Tartrate) .... Take 1 By Mouth Two Times A Day 3)  Amlodipine Besylate 10 Mg  Tabs (Amlodipine Besylate) .... Take 1 By Mouth Once Daily 4)  Lisinopril 40 Mg  Tabs (Lisinopril) .... Take 1 By Mouth Once Daily 5)  Hydrochlorothiazide 25 Mg  Tabs (Hydrochlorothiazide) .... Take 1 By Mouth Once Daily 6)  Centrum Silver   Tabs (Multiple Vitamins-Minerals) .... Take 1 By Mouth Once Daily 7)  Ferrous Gluconate 325 Mg Tabs (Ferrous Gluconate) .... Take 1 Tablet By Mouth Once A Day 8)  Vitamin C 500 Mg  Chew (Ascorbic Acid) .... Take 1 By Mouth Once Daily 9)  Glyburide 5 Mg Tabs (Glyburide) .... Take One-Half  Tab By Mouth Once Daily 10)  Crestor 10 Mg Tabs (Rosuvastatin Calcium) .Marland Kitchen.. 1 Tab Once Daily  Allergies (verified): No Known Drug Allergies  Past History:  Past Medical History: Reviewed history from 06/07/2009  and no changes required.  HYPERTENSION (ICD-401.9) ARTERIOSCLEROTIC HEART DISEASE (ICD-414.00) PERIPHERAL VASCULAR DISEASE (ICD-443.9) HYPERCHOLESTEROLEMIA (ICD-272.0) DIABETES MELLITUS (ICD-250.00) RENAL INSUFFICIENCY (ICD-588.9) DEGENERATIVE JOINT DISEASE (ICD-715.90) Hx of ANEMIA OF CHRONIC DISEASE (ICD-285.29)  1. Coronary artery disease, status post coronary artery bypass graft     surgery in 2000, now stable. 2. Good left ventricular function. 3. Hypertension, under good control. 4. Hyperlipidemia. 5. Diabetes. 6. History of anemia. 7. Peripheral vascular disease.   Review of Systems       ROS is negative except as outlined in HPI.   Vital Signs:  Patient profile:   77 year old male Height:      669 inches Weight:      176 pounds BMI:     0.28 Pulse rate:   63 / minute BP sitting:   134 / 60  (left arm) Cuff size:   large  Vitals Entered By: Lubertha Basque, CNA (December 02, 2009 10:21 AM)  Physical Exam  Additional Exam:  Gen. Well-nourished, in no distress   Neck: No JVD, thyroid not enlarged, no carotid bruits Lungs: No tachypnea, clear without rales, rhonchi or wheezes Cardiovascular: Rhythm regular, PMI not displaced,  heart sounds  normal, grade 2/6 systolic murmur at the left sternal head and apex, no peripheral edema, pulses normal in all 4 extremities. Abdomen: BS normal, abdomen soft  and non-tender without masses or organomegaly, no hepatosplenomegaly. MS: No deformities, no cyanosis or clubbing   Neuro:  No focal sns   Skin:  no lesions    Impression & Recommendations:  Problem # 1:  CAD, AUTOLOGOUS BYPASS GRAFT (ICD-414.02)  Had bypass surgery in 2000. He has had no recent chest pain shortness of breath or palpitations. This problem appears stable. His updated medication list for this problem includes:    Ecotrin 325 Mg Tbec (Aspirin) .Marland Kitchen... Take 1 by mouth once daily    Metoprolol Tartrate 25 Mg Tabs (Metoprolol tartrate) .Marland Kitchen... Take 1 by mouth  two times a day    Amlodipine Besylate 10 Mg Tabs (Amlodipine besylate) .Marland Kitchen... Take 1 by mouth once daily    Lisinopril 40 Mg Tabs (Lisinopril) .Marland Kitchen... Take 1 by mouth once daily  Orders: EKG w/ Interpretation (93000)  Problem # 2:  PVD WITH CLAUDICATION (ICD-443.89) He has PVD with claudication. Dr. Burt Knack has seen him and has recommended initial medical therapy. I am not sure his claudication symptoms are bad enough that he wants to pursue angiography and possible intervention. I think the burning on the back of his feet is not likely related to his peripheral vascular disease. I think this is more likely related to neuropathy possibly from his diabetes. He has an appointment with Dr. Lenna Gilford next week and perhaps he can address this and see if there is any medication that might help.  Problem # 3:  HYPERTENSION, BENIGN (ICD-401.1) This is well controlled on current medications. His updated medication list for this problem includes:    Ecotrin 325 Mg Tbec (Aspirin) .Marland Kitchen... Take 1 by mouth once daily    Metoprolol Tartrate 25 Mg Tabs (Metoprolol tartrate) .Marland Kitchen... Take 1 by mouth two times a day    Amlodipine Besylate 10 Mg Tabs (Amlodipine besylate) .Marland Kitchen... Take 1 by mouth once daily    Lisinopril 40 Mg Tabs (Lisinopril) .Marland Kitchen... Take 1 by mouth once daily    Hydrochlorothiazide 25 Mg Tabs (Hydrochlorothiazide) .Marland Kitchen... Take 1 by mouth once daily  Problem # 4:  HYPERLIPIDEMIA-MIXED (ICD-272.4) This is currently being managed with Crestor and followed by his primary care physician. The following medications were removed from the medication list:    Simvastatin 40 Mg Tabs (Simvastatin) .Marland Kitchen... Take 1 by mouth every  evening His updated medication list for this problem includes:    Crestor 10 Mg Tabs (Rosuvastatin calcium) .Marland Kitchen... 1 tab once daily  Patient Instructions: 1)  Your physician recommends that you schedule a follow-up appointment in: December with Dr. Burt Knack. 2)  Your physician recommends that you  continue on your current medications as directed. Please refer to the Current Medication list given to you today.

## 2010-03-30 NOTE — Letter (Signed)
Summary: [facility]   Imported By: Marilynne Drivers 12/16/2009 14:33:20  _____________________________________________________________________  External Attachment:    Type:   Image     Comment:   External Document

## 2010-03-30 NOTE — Assessment & Plan Note (Signed)
Summary: 6 months/apc   Primary Care Provider:  dr Lenna Gilford  CC:  6 month ROV & review of mult medical problems....  History of Present Illness: 77 y/o WM here for a follow up visit... he has mult med problems as listed and is followed by DrBBrodie and DrPerry as well... he gets his meds filled at the [facility] which makes for a complicated array of perscribed meds and substituted alternatives...   ~  June 08, 2008:  routine 6 mo follow up but notes some persistant left CWP- constant, sl tender, denies trauma, no incr discomfort w/ activ, +SOB w/ stairs but this is chr & no change... he hasn't tried any OTC Rx and refuses pain meds... ** we discussed hot soaks, Tylenol, Advil, etc... he has f/u w/ DrBBrodie soon.   ~  December 07, 2008:  saw DrBB for Cards f/u 5/10- doing satis, no changes made... today c/o some leg cramps at night & we discussed the Tonic water/ Mustard/ Soap bar tricks... he has PVD and legs also "tighten up" while walking- stable pattern, no worsening, he continues to exercise w/ treadmill etc...  some incr stress caring for wife w/ memory problems... OK Flu shot today.   ~  June 07, 2009:  true to form the [place] has changed his Metformin to Glyburide- initially 5mg /d then had to decr to 1/2 tab Qam due to side effects... he notes sugars are "up & down" at home> he pays $9 per Rx at [place] I encouraged him to look into the [facility]/ [facility] $4 lists... for now we will continue the [place] meds so as not to confuse &/ or conflict the pt any further... he saw DrBBrodie4/11- stable, no angina... referred to [facility] [name] for f/u PVD> art dopplers 11/10 w/ >50% SFA stenosis & ABI's 0.9.Marland Kitchen. he continues on exercise program w/ stable claud symptoms...      **he went to a large buffet last PM & wants to wait 2 weeks for Fasting Labs here... OK.    Current Problems:  HYPERTENSION (ICD-401.9) - controlled on 4 meds:  METOPROLOL 25mg Bid, AMLODIPINE 10mg /d, LISINOPRIL 40mg /d, and HCT 25mg /d... BP=140/60 and  doing well without side effects... denies HA, fatigue, visual changes, CP, palipit, dizziness, syncope, dyspnea, edema, etc...  ~  2DEcho 1996 showed mild LVH, AoV sclerosis & mild AI, EF= 60%...  ARTERIOSCLEROTIC HEART DISEASE (ICD-414.00) - on ASA 325mg /d... long time pt of DrJoeLeBauer & followed by DrBrodie now... s/p CABG in 2000 by DrBartle (for L main dis)... he is active & w/o angina, palpit, change in SOB/ DOE, etc...  ~  NuclearStressTest 9/08 was normal without ischemia and EF=68%...  PERIPHERAL VASCULAR DISEASE (ICD-443.9) - on ASA daily and he has bilat femoral bruits... he has seen DrDowney in the past w/ exerc program recommended... he walks regularly w/ stable claud symptoms, no sores or lesions on LE's...  ~  ABI's 10/08 were normal bilat = 1.0.Marland KitchenMarland Kitchen waveforms were biphasic however (no change from 2005).  ~  CDoppler's 10/08 showed mild bilat carotid dis w/ 40-59% RICA stenosis, and [id number] LICA stenosis.  ~  CDoppler's 10/09 showed the same= stable.  ~  CDoppler's 11/10 showed stable mild carotid dis bilat> 123456 RICA & [id number] LICA stenoses.  ~  ABI's 11/10 showed prob severe >50% distal right SFA stenosis, & mod dis in left SFA, ABI's=0.9 He has appt w/ [name] pending...  HYPERCHOLESTEROLEMIA (ICD-272.0) - controlled on SIMVASTATIN 40mg /d + diet.  ~  FLP 10/08 showed TChol 137, TG  123, HDL 34, LDL 78  ~  FLP 2/09 showed Tchol 169, TG 206, HDL 37, LDL 96  ~  FLP 6/09 showed TChol 140, TG 141, HDL 35, LDL 77... rec- continue same.  ~  [place] 9/09 on Simva40 showed TChol 152, TG 152, HDL 37, LDL 85  ~  FLP 4/10 on Simva40 showed TChol 146, TG 145, HDL 36, LDL 82  ~  FLP 10/10 showed TChol 166, TG 130, HDL 38, LDL 102   DIABETES MELLITUS (ICD-250.00) - he's struggled w/ diet Rx... wt stable  ~175 range... prev on Metformin 500Bid but [facility] changed to GLYBURIDE 5mg /d, then decr to 1/2 tab daily due to side effects...  ~  labs 10/08 showed FBS= 134, and HgA1c= 5.4...  ~  labs 2/09 showed  BS= 118, HgA1c= 6.4...  ~  labs 6/09 showed BS= 101, HgA1c= 6.4.Marland KitchenMarland Kitchen rec- same med, better diet.  ~  labs 9/09 showed BS= 111, HgA1c= not done... Monofilament test normal.  ~  [facility] eval 1/10 at [facility] showed no DM retinopathy...  ~  labs 4/10 on MetformBid showed BS= 106, A1c= 6.8, Urine Microalb= sl incr @ 7.8mg %  ~  labs 10/10 showed BS= 119, A1c= 6.5...  NOTE: 2/11 [facility] ch to Glybur5mg - 1/2 daily.   RENAL INSUFFICIENCY (ICD-588.9) - Creat 1.8-2.0 in the past...  ~  labs 6/09 showed BUN= 31, Creat= 1.6...  ~  labs 9/09 showed BUN= 25, Creat= 1.6, K= 4.5  ~  labs 4/10 showed BUN= 26, Creat= 1.6, K= 5.0  ~  labs 10/10 showed BUN= 21, Creat= 1.6   DEGENERATIVE JOINT DISEASE (ICD-715.90)  Hx of ANEMIA OF CHRONIC DISEASE (ICD-285.29) - eval by DrGranfortuna w/ ACD secondary to his renal insuffic and Rx w/ aranesp periodically... nothing else found on his extensive eval... he was taking Ferrous Gluconate daily (it no longer makes him tired)...  ~  labs 2/09 w/ Hg= 11.2.Marland Kitchen.  ~  labs 6/09 w/ Hg= 11.0.Marland Kitchen. rec> OTC Fe prep but he never got it!  ~  labs 9/09 showed Hg= 10.5, Fe= 81  ~  labs 4/10 showed Hg= 10.5, MCV= 92, Fe= 77 (22%sat), B12= 590, Folate>20... rec> OTC Fe daily.  ~  labs 10/10 showed Hg= 10.3, Fe= 88    Allergies (verified): No Known Drug Allergies  Comments:  Nurse/Medical Assistant: The patient's medications and allergies were reviewed with the patient and were updated in the Medication and Allergy Lists.  Past History:  Past Medical History:  HYPERTENSION (ICD-401.9) ARTERIOSCLEROTIC HEART DISEASE (ICD-414.00) PERIPHERAL VASCULAR DISEASE (ICD-443.9) HYPERCHOLESTEROLEMIA (ICD-272.0) DIABETES MELLITUS (ICD-250.00) RENAL INSUFFICIENCY (ICD-588.9) DEGENERATIVE JOINT DISEASE (ICD-715.90) Hx of ANEMIA OF CHRONIC DISEASE (ICD-285.29)  1. Coronary artery disease, status post coronary artery bypass graft     surgery in 2000, now stable. 2. Good left ventricular function. 3.  Hypertension, under good control. 4. Hyperlipidemia. 5. Diabetes. 6. History of anemia. 7. Peripheral vascular disease.   Past Surgical History: S/P cataract surgery S/P CABG 2000 by DrBartle  Family History: Reviewed history from 08/07/2007 and no changes required. mother died age 74  father died age 75 2 siblings 1 sister died age 39 1 sister alive age 81  Social History: Reviewed history from 06/08/2008 and no changes required. retired drinks one drink per day married 5 children Former Smoker--quit smoking in 1955--1 ppd--smoked for 10 years  Review of Systems      See HPI  The patient denies anorexia, fever, weight loss, weight gain, vision loss, decreased hearing, hoarseness,  chest pain, syncope, dyspnea on exertion, peripheral edema, prolonged cough, headaches, hemoptysis, abdominal pain, melena, hematochezia, severe indigestion/heartburn, hematuria, incontinence, muscle weakness, suspicious skin lesions, transient blindness, difficulty walking, depression, unusual weight change, abnormal bleeding, enlarged lymph nodes, and angioedema.    Vital Signs:  Patient profile:   77 year old male Height:      69 inches Weight:      178.25 pounds BMI:     26.42 O2 Sat:      98 % on Room air Temp:     97.1 degrees F oral Pulse rate:   78 / minute BP sitting:   140 / 60  (left arm) Cuff size:   regular  Vitals Entered By: Elita Boone CMA (June 07, 2009 9:44 AM)  O2 Sat at Rest %:  98 O2 Flow:  Room air CC: 6 month ROV & review of mult medical problems... Is Patient Diabetic? Yes Pain Assessment Patient in pain? no      Comments meds updated today   Physical Exam  Additional Exam:   WD, WN, 77 y/o WM in NAD... GENERAL:  Alert & oriented; pleasant & cooperative... HEENT:  [place]/AT, EOM-wnl, PERRLA,  EACs-clear, TMs-wnl, NOSE-clear, THROAT-clear & wnl. NECK:  Supple w/ fairROM; no JVD; normal carotid impulses w/ faint right bruit; no thyromegaly or nodules  palpated; no lymphadenopathy. CHEST:  Clear to P & A; without wheezes/ rales/ or rhonchi heard... HEART:  Regular, gr 1/6 SEM without rubs or gallops heard... ABDOMEN:  Soft & nontender; normal bowel sounds; no organomegaly or masses detected. EXT: without deformities, mod arthritic changes; no varicose veins/ venous insuffic/ or edema. he has bilat fem art bruits & minor pressure area betw right 4th & 5th toes... NEURO:  CN's intact;  no focal neuro deficits... DERM:  No lesions noted; no rash etc...    Impression & Recommendations:  Problem # 1:  HYPERTENSION (ICD-401.9) Controlled on meds... take regularly, no salt reviewed, etc... His updated medication list for this problem includes:    Metoprolol Tartrate 25 Mg Tabs (Metoprolol tartrate) .Marland Kitchen... Take 1 by mouth two times a day    Amlodipine Besylate 10 Mg Tabs (Amlodipine besylate) .Marland Kitchen... Take 1 by mouth once daily    Lisinopril 40 Mg Tabs (Lisinopril) .Marland Kitchen... Take 1 by mouth once daily    Hydrochlorothiazide 25 Mg Tabs (Hydrochlorothiazide) .Marland Kitchen... Take 1 by mouth once daily  Problem # 2:  ARTERIOSCLEROTIC HEART DISEASE (ICD-414.00) Stable- no angina, exercising regularly etc... His updated medication list for this problem includes:    Ecotrin 325 Mg Tbec (Aspirin) .Marland Kitchen... Take 1 by mouth once daily    Metoprolol Tartrate 25 Mg Tabs (Metoprolol tartrate) .Marland Kitchen... Take 1 by mouth two times a day    Amlodipine Besylate 10 Mg Tabs (Amlodipine besylate) .Marland Kitchen... Take 1 by mouth once daily    Lisinopril 40 Mg Tabs (Lisinopril) .Marland Kitchen... Take 1 by mouth once daily    Hydrochlorothiazide 25 Mg Tabs (Hydrochlorothiazide) .Marland Kitchen... Take 1 by mouth once daily  Problem # 3:  PERIPHERAL VASCULAR DISEASE (ICD-443.9) He has up coming appt w/ PV- [name]... in the meanwhile- continue exercise program & ASA...  Problem # 4:  HYPERCHOLESTEROLEMIA (ICD-272.0) He will ret for FLP in 2 weeks... His updated medication list for this problem includes:    Simvastatin  40 Mg Tabs (Simvastatin) .Marland Kitchen... Take 1 by mouth every  evening  Problem # 5:  DIABETES MELLITUS (ICD-250.00) Meds cahnged per [facility] & we don't weant him to be confused or  conflicted... continue present Rx and f/u labs 2 weeks. His updated medication list for this problem includes:    Ecotrin 325 Mg Tbec (Aspirin) .Marland Kitchen... Take 1 by mouth once daily    Lisinopril 40 Mg Tabs (Lisinopril) .Marland Kitchen... Take 1 by mouth once daily    Glyburide 5 Mg Tabs (Glyburide) .Marland Kitchen... 1/2  tab once daily  Problem # 6:  RENAL INSUFFICIENCY (ICD-588.9) Stable Creat  ~1.6... no change x >52yrs... f/u labs pending...  Problem # 7:  Hx of ANEMIA OF CHRONIC DISEASE (ICD-285.29) F/u labs in 2 weeks as noted... His updated medication list for this problem includes:    Ferrous Gluconate 325 Mg Tabs (Ferrous gluconate) .Marland Kitchen... Take 1 tablet by mouth once a day  Complete Medication List: 1)  Ecotrin 325 Mg Tbec (Aspirin) .... Take 1 by mouth once daily 2)  Metoprolol Tartrate 25 Mg Tabs (Metoprolol tartrate) .... Take 1 by mouth two times a day 3)  Amlodipine Besylate 10 Mg Tabs (Amlodipine besylate) .... Take 1 by mouth once daily 4)  Lisinopril 40 Mg Tabs (Lisinopril) .... Take 1 by mouth once daily 5)  Hydrochlorothiazide 25 Mg Tabs (Hydrochlorothiazide) .... Take 1 by mouth once daily 6)  Simvastatin 40 Mg Tabs (Simvastatin) .... Take 1 by mouth every  evening 7)  Centrum Silver Tabs (Multiple vitamins-minerals) .... Take 1 by mouth once daily 8)  Ferrous Gluconate 325 Mg Tabs (Ferrous gluconate) .... Take 1 tablet by mouth once a day 9)  Vitamin C 500 Mg Chew (Ascorbic acid) .... Take 1 by mouth once daily 10)  Glyburide 5 Mg Tabs (Glyburide) .... 1/2  tab once daily  Patient Instructions: 1)  Today we updated your med list- see below.... 2)  Continue the meds from the [facility] for now... 3)  Look into the [facility] $4 lists as we discussed... 4)  Please return to our lab the 1st week of May for your FASTING blood work...  then  call the "phone tree" in a few days for your lab results.Marland KitchenMarland Kitchen 5)  Call for any problems... continue your exercise program! 6)  Please schedule a follow-up appointment in 6 months, sooner as needed.

## 2010-03-30 NOTE — Letter (Signed)
Summary: [facility] [place]  [facility] [place]   Imported By: Marilynne Drivers 12/16/2009 14:34:02  _____________________________________________________________________  External Attachment:    Type:   Image     Comment:   External Document

## 2010-03-30 NOTE — Progress Notes (Signed)
Summary: needs sn appt/cb  Phone Note Call from Patient Call back at Home Phone [phone]   Caller: Patient Call For: nadel Summary of Call: pt had to cancel appt on 2022/12/28 due to death in family needs a ov with sn please help Initial call taken by: Don Broach,  December 29, 2009 3:33 PM  Follow-up for Phone Call        called spoke with patient to inform him that his request for another appt will be sent ot SN for possible workin.  pt verbalized his understanding.  Leigh, is there a place for pt to be worked in, or does he need to wait for forst available? Parke Poisson CNA/MA  December 29, 2009 3:43 PM   Additional Follow-up for Phone Call Additional follow up Details #1::        there are 2 appts avaliable on monday 11-7 at 10 and 2---its ok to use one of those spots.  thanks [place]  December 29, 2009 3:55 PM     Additional Follow-up for Phone Call Additional follow up Details #2::    Spoke with pt and notified- appt was sched for 01/02/10 at 10 am. Follow-up by: Tilden Dome,  December 29, 2009 4:04 PM

## 2010-03-30 NOTE — Assessment & Plan Note (Signed)
Summary: f10m   Visit Type:  6 months follow up Primary Provider:  dr Lenna Gilford  CC:  none.  History of Present Illness: 77 year-old male presents for followup of coronary and peripheral arterial disease. He is s/p CABG in 2000. He has been evaluated for intermittent claudication and is managed medically.   He reports stable bilateral calf claudication at about 10-15 minutes of walking. Denies progression of symptoms or rest pain/ulceration. He injured his right 5th toe several months ago and continues to have some pain but no skin breakdown.   Denies chest pain, dyspnea, or edema. No other complaints.  Current Medications (verified): 1)  Ecotrin 325 Mg  Tbec (Aspirin) .... Take 1 By Mouth Once Daily 2)  Metoprolol Tartrate 25 Mg  Tabs (Metoprolol Tartrate) .... Take 1 By Mouth Two Times A Day 3)  Amlodipine Besylate 10 Mg  Tabs (Amlodipine Besylate) .... Take 1 By Mouth Once Daily 4)  Lisinopril 40 Mg  Tabs (Lisinopril) .... Take 1 By Mouth Once Daily 5)  Crestor 10 Mg Tabs (Rosuvastatin Calcium) .Marland Kitchen.. 1 Tab Once Daily 6)  Centrum Silver   Tabs (Multiple Vitamins-Minerals) .... Take 1 By Mouth Once Daily 7)  Ferrous Gluconate 325 Mg Tabs (Ferrous Gluconate) .... Take 1 Tablet By Mouth Once A Day 8)  Vitamin C 500 Mg  Chew (Ascorbic Acid) .... Take 1 By Mouth Once Daily  Allergies (verified): No Known Drug Allergies  Past History:  Past medical history reviewed for relevance to current acute and chronic problems.  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 Coronary artery disease, status post coronary artery bypass graft     surgery in 2000, now stable. 2. Good left ventricular  function. 3. Hypertension, under good control. 4. Hyperlipidemia. 5. Diabetes. 6. History of anemia. 7. Peripheral vascular disease - intermittent claudication  Review of Systems       Negative except as per HPI   Vital Signs:  Patient profile:   77 year old male Height:      69 inches Weight:      170 pounds BMI:     25.20 Pulse rate:   64 / minute Pulse rhythm:   regular Resp:     18 per minute BP sitting:   132 / 56  (left arm) Cuff size:   large  Vitals Entered By: Sidney Ace (February 07, 2010 10:24 AM)  Serial Vital Signs/Assessments:  Time      Position  BP       Pulse  Resp  Temp     By           R Arm     132/60                         Sidney Ace   Physical Exam  General:  Pt is alert and oriented, in no acute distress. HEENT: normal Neck: normal carotid upstrokes with a right carotid bruit, JVP normal Lungs: CTA CV: RRR without murmur or gallop Abd: soft, NT, positive BS, no bruit, no organomegaly Ext: no clubbing, cyanosis, or edema. pedal pulses palpable bilaterally Skin: warm and dry without rash    Impression & Recommendations:  Problem # 1:  PVD WITH CLAUDICATION (ICD-443.89) ABI/doppler studies reviewed from [facility] in September 2011.Marland KitchenMarland KitchenRight  ABI 0.82, Left ABI 0.74 with bilateral SFA disease left greater than right. In the setting of stable symptoms with minimal limitation, would continue with observation and medical therapy. Toe pressure are adequate for healing. He is on appropriate medical therapy with good control of CV risk factors.  Problem # 2:  CAD, AUTOLOGOUS BYPASS GRAFT (ICD-414.02) Stable without angina.  His updated medication list for this problem includes:    Ecotrin 325 Mg Tbec (Aspirin) .Marland Kitchen... Take 1 by mouth once daily    Metoprolol Tartrate 25 Mg Tabs (Metoprolol tartrate) .Marland Kitchen... Take 1 by mouth two times a day    Amlodipine Besylate 10 Mg Tabs (Amlodipine besylate) .Marland Kitchen... Take 1 by mouth once daily    Lisinopril 40 Mg Tabs  (Lisinopril) .Marland Kitchen... Take 1 by mouth once daily  Problem # 3:  HYPERTENSION (ICD-401.9) Controlled.  His updated medication list for this problem includes:    Ecotrin 325 Mg Tbec (Aspirin) .Marland Kitchen... Take 1 by mouth once daily    Metoprolol Tartrate 25 Mg Tabs (Metoprolol tartrate) .Marland Kitchen... Take 1 by mouth two times a day    Amlodipine Besylate 10 Mg Tabs (Amlodipine besylate) .Marland Kitchen... Take 1 by mouth once daily    Lisinopril 40 Mg Tabs (Lisinopril) .Marland Kitchen... Take 1 by mouth once daily  BP today: 132/56 Prior BP: 110/58 (01/02/2010)  Labs Reviewed: K+: 5.1 (01/03/2010) Creat: : 2.1 (01/03/2010)   Chol: 147 (01/03/2010)   HDL: 34.20 (01/03/2010)   LDL: 81 (01/03/2010)   TG: 161.0 (01/03/2010)  Problem # 4:  HYPERCHOLESTEROLEMIA (ICD-272.0) LDL at goal as below. Continue current Rx.  His updated medication list for this problem includes:    Crestor 10 Mg Tabs (Rosuvastatin calcium) .Marland Kitchen... 1 tab once daily  CHOL: 147 (01/03/2010)   LDL: 81 (01/03/2010)   HDL: 34.20 (01/03/2010)   TG: 161.0 (01/03/2010)  Problem # 5:  CAROTID ARTERY DISEASE (ICD-433.10) Moderate right carotid disease (less than 60%). Recommend carotid duplex and lower extremity duplex/ABI in September 2012...will schedule at the time of his RTN visit in the Summer.  His updated medication list for this problem includes:    Ecotrin 325 Mg Tbec (Aspirin) .Marland Kitchen... Take 1 by mouth once daily  Patient Instructions: 1)  Your physician recommends that you schedule a follow-up appointment in: 6 months 2)  Your physician recommends that you continue on your current medications as directed. Please refer to the Current Medication list given to you today.

## 2010-03-30 NOTE — Letter (Signed)
Summary: [facility]  [facility] Vascular Ultrasound   Imported By: Marilynne Drivers 12/16/2009 14:32:11  _____________________________________________________________________  External Attachment:    Type:   Image     Comment:   External Document

## 2010-03-30 NOTE — Medication Information (Signed)
Summary: [facility] [place]   Imported By: Phillis Knack 05/18/2009 13:12:51  _____________________________________________________________________  External Attachment:    Type:   Image     Comment:   External Document

## 2010-03-30 NOTE — Consult Note (Signed)
Summary: [facility]   Imported By: Phillis Knack 06/02/2009 11:51:07  _____________________________________________________________________  External Attachment:    Type:   Image     Comment:   External Document

## 2010-03-30 NOTE — Assessment & Plan Note (Signed)
Summary: Henry [name]   Visit Type:  Initial Consult Primary Provider:  dr Lenna Gilford  CC:  New PV evaluation per Dr. Olevia Perches.  History of Present Illness: 77 year-old male presenting for initial evaluation of lower extremity PAD. He has longstanding intermittent claudication at about 10 minutes of walking. This occurs in the calves and both legs are affected equally. He describes these symptoms as stable over the preceding 2 years, and the calf pain resolves within a few minutes of rest. No thigh or buttock pain. No ischemic ulcerations.  The pt is s/p CABG in 2000. He has no anginal symptoms. No history of stroke or TIA.   Current Medications (verified): 1)  Ecotrin 325 Mg  Tbec (Aspirin) .... Take 1 By Mouth Once Daily 2)  Metoprolol Tartrate 25 Mg  Tabs (Metoprolol Tartrate) .... Take 1 By Mouth Two Times A Day 3)  Amlodipine Besylate 10 Mg  Tabs (Amlodipine Besylate) .... Take 1 By Mouth Once Daily 4)  Lisinopril 40 Mg  Tabs (Lisinopril) .... Take 1 By Mouth Once Daily 5)  Hydrochlorothiazide 25 Mg  Tabs (Hydrochlorothiazide) .... Take 1 By Mouth Once Daily 6)  Simvastatin 40 Mg  Tabs (Simvastatin) .... Take 1 By Mouth Every  Evening 7)  Centrum Silver   Tabs (Multiple Vitamins-Minerals) .... Take 1 By Mouth Once Daily 8)  Ferrous Gluconate 325 Mg Tabs (Ferrous Gluconate) .... Take 1 Tablet By Mouth Once A Day 9)  Vitamin C 500 Mg  Chew (Ascorbic Acid) .... Take 1 By Mouth Once Daily 10)  Glyburide 5 Mg Tabs (Glyburide) .... 1/2  Tab Once Daily  Allergies (verified): No Known Drug Allergies  Past History:  Past medical, surgical, family and social histories (including risk factors) reviewed, and no changes noted (except as noted below).  Past Medical History: Reviewed history from 06/07/2009 and no changes required.  HYPERTENSION (ICD-401.9) ARTERIOSCLEROTIC HEART DISEASE (ICD-414.00) PERIPHERAL VASCULAR DISEASE (ICD-443.9) HYPERCHOLESTEROLEMIA (ICD-272.0) DIABETES MELLITUS  (ICD-250.00) RENAL INSUFFICIENCY (ICD-588.9) DEGENERATIVE JOINT DISEASE (ICD-715.90) Hx of ANEMIA OF CHRONIC DISEASE (ICD-285.29)  1. Coronary artery disease, status post coronary artery bypass graft     surgery in 2000, now stable. 2. Good left ventricular function. 3. Hypertension, under good control. 4. Hyperlipidemia. 5. Diabetes. 6. History of anemia. 7. Peripheral vascular disease.   Past Surgical History: Reviewed history from 06/07/2009 and no changes required. S/P cataract surgery S/P CABG 2000 by DrBartle  Family History: Reviewed history from 08/07/2007 and no changes required. mother died age 22  father died age 10 2 siblings 1 sister died age 75 1 sister alive age 48  Social History: Reviewed history from 06/08/2008 and no changes required. retired drinks one drink per day married 5 children Former Smoker--quit smoking in 1955--1 ppd--smoked for 10 years  Review of Systems       Negative except as per HPI   Vital Signs:  Patient profile:   77 year old male Height:      69 inches Weight:      178 pounds BMI:     26.38 Pulse rate:   64 / minute Pulse rhythm:   regular Resp:     18 per minute BP sitting:   122 / 54  (left arm) Cuff size:   large  Vitals Entered By: Sidney Ace (July 29, 2009 3:30 PM)  Serial Vital Signs/Assessments:  Time      Position  BP       Pulse  Resp  Temp     By  R Arm     120/52                         Sidney Ace   Physical Exam  General:  Pt is alert and oriented, in no acute distress. HEENT: normal Neck: normal carotid upstrokes without bruits, JVP normal Lungs: CTA CV: RRR with 2/6 SEM at LSB Abd: soft, NT, positive BS, no bruit, no organomegaly Ext: no edema. fem pulses 2+=, Pt 2+=, DP diminished bilaterally Skin: warm and dry without rash, no ulceration    Arterial Doppler  Procedure date:  01/11/2009  Findings:      ABI: Right 0.94, Left 0.90. Elevated velocities right SFA suggest >  50% stenosis  Impression & Recommendations:  Problem # 1:  ATHEROSCLEROSIS W /INT CLAUDICATION (ICD-440.21) The patient has stable intermittent claudication, mildly lifestyle limiting. His symptoms allow for about 10 minutes of walking, and the obstructive disease appears to be in the mid and distal SFA. I would favor observation for now with continued medical management and focus on a walking program with a goal of improving walking distance. Will follow-up in 6 months to reassess symptoms.  Patient Instructions: 1)  Your physician recommends that you schedule a follow-up appointment in: 6 months with Dr. Burt Knack. 2)  Your physician recommends that you continue on your current medications as directed. Please refer to the Current Medication list given to you today.

## 2010-03-30 NOTE — Progress Notes (Signed)
Summary: F/U on letter from [facility]  Phone Note Outgoing Call   Call placed by: Alvis Lemmings, RN, BSN,  November 17, 2009 3:42 PM Call placed to: Patient Summary of Call: I called and spoke with the pt in regards to a letter we had received from Dr. Amalia Hailey at [facility]. He had sent the pt for lower arterial studies at [facility] that showed ABI's of 0.82 on the right and 0.74 on the left. The pt tells me that he had decreased his glyburide and then stubbed his toe at home. He developed burning in the toe that would not go away. He saw Dr. Amalia Hailey to make sure he did not have a broken toe, and he did not, but his dopplers have worsened from the last readings we have. I was calling the pt today to get him in to see Dr. Burt Knack. The pt tells me he has increased his glyburide to 5mg  once daily and his toe has stopped hurting. He is due for regular f/u with Dr. Olevia Perches in October. He wants to just f/u with him since his toe feels better. I have scheduled the pt for an appt. on 10/7 with Dr. Olevia Perches. He will call me should he develop pain in his legs again. If this occurs, I have made him aware that he would need to see Dr. Burt Knack back for his vascular disease.  Initial call taken by: Alvis Lemmings, RN, BSN,  November 17, 2009 3:48 PM    New/Updated Medications: GLYBURIDE 5 MG TABS (GLYBURIDE) take one tab by mouth once daily

## 2010-03-30 NOTE — Assessment & Plan Note (Signed)
Summary: 6 month rov   Visit Type:  Follow-up Primary Provider:  dr Lenna Gilford  CC:  rt toe is very painful.  History of Present Illness: The patient is 77 years old and return for management of CAD. He had bypass surgery in 2000. He's done well since that time has had no recent chest pain shortness of breath or palpitations.  His biggest complaint now is related to foot pain. He traumatized the little toe on his right foot and it has been sore since that time. We have evaluated him for symptoms of calf pain with exertion and a peripheral Doppler studies which showed indices of 0.9 bilaterally. We had recommended PT referral but he never followed up on this.  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  Current Medications (verified): 1)  Ecotrin 325 Mg  Tbec (Aspirin) .... Take 1 By Mouth Once Daily 2)  Metoprolol Tartrate 25 Mg  Tabs (Metoprolol Tartrate) .... Take 1 By Mouth Two Times A Day 3)  Amlodipine Besylate 10 Mg  Tabs (Amlodipine Besylate) .... Take 1 By Mouth Once Daily 4)  Lisinopril 40 Mg  Tabs (Lisinopril) .... Take 1 By Mouth Once Daily 5)  Hydrochlorothiazide 25 Mg  Tabs (Hydrochlorothiazide) .... Take 1 By Mouth Once Daily 6)  Simvastatin 40 Mg  Tabs (Simvastatin) .... Take 1 By Mouth Every  Evening 7)  Centrum Silver   Tabs (Multiple Vitamins-Minerals) .... Take 1 By Mouth Once Daily 8)  Vitamin C 500 Mg  Chew (Ascorbic Acid) .... Take 1 By Mouth Once Daily 9)  Ferrous Gluconate 325 Mg Tabs (Ferrous Gluconate) .... Take 1 Tablet By Mouth Once A Day 10)  Glyburide 5 Mg Tabs (Glyburide) .Marland Kitchen.. 1 Tab Once Daily  Allergies (verified): No Known Drug Allergies  Past History:  Past Medical History: Reviewed history from 12/07/2008 and no changes required. HYPERTENSION (ICD-401.9) ARTERIOSCLEROTIC HEART DISEASE (ICD-414.00) PERIPHERAL VASCULAR DISEASE  (ICD-443.9) HYPERCHOLESTEROLEMIA (ICD-272.0) DIABETES MELLITUS (ICD-250.00) RENAL INSUFFICIENCY (ICD-588.9) DEGENERATIVE JOINT DISEASE (ICD-715.90) Hx of ANEMIA OF CHRONIC DISEASE (ICD-285.29)  1. Coronary artery disease, status post coronary artery bypass graft     surgery in 2000, now stable. 2. Good left ventricular function. 3. Hypertension, under good control. 4. Hyperlipidemia. 5. Diabetes. 6. History of anemia. 7. Peripheral vascular disease.   Review of Systems       ROS is negative except as outlined in HPI.   Vital Signs:  Patient profile:   77 year old male Height:      69 inches Weight:      174 pounds BMI:     25.79 Pulse rate:   65 / minute BP sitting:   123 / 56  (left arm) Cuff size:   large  Vitals Entered By: Lubertha Basque, CNA (June 01, 2009 2:31 PM)  Physical Exam  Additional Exam:  Gen. Well-nourished, in no distress   Neck: No JVD, thyroid not enlarged, no carotid bruits Lungs: No tachypnea, clear without rales, rhonchi or wheezes Cardiovascular: Rhythm regular, PMI not displaced,  heart sounds  normal, no murmurs or gallops, no peripheral edema, pulses were somewhat difficult to feel in both feet Abdomen: BS normal, abdomen soft and non-tender without masses or organomegaly, no hepatosplenomegaly. MS: No deformities, no cyanosis or clubbing   Neuro:  No focal sns   Skin:  no lesions    Impression & Recommendations:  Problem # 1:  CAD, AUTOLOGOUS BYPASS GRAFT (  [id number])  He had previous bypass surgery in 1990. He's had no recent chest pain this problem appears stable. His updated medication list for this problem includes:    Ecotrin 325 Mg Tbec (Aspirin) .Marland Kitchen... Take 1 by mouth once daily    Metoprolol Tartrate 25 Mg Tabs (Metoprolol tartrate) .Marland Kitchen... Take 1 by mouth two times a day    Amlodipine Besylate 10 Mg Tabs (Amlodipine besylate) .Marland Kitchen... Take 1 by mouth once daily    Lisinopril 40 Mg Tabs (Lisinopril) .Marland Kitchen... Take 1 by mouth once  daily  Orders: EKG w/ Interpretation (93000) Misc. Referral (Misc. Ref)  Problem # 2:  HYPERTENSION, BENIGN (ICD-401.1) This is well controlled on current medications. His updated medication list for this problem includes:    Ecotrin 325 Mg Tbec (Aspirin) .Marland Kitchen... Take 1 by mouth once daily    Metoprolol Tartrate 25 Mg Tabs (Metoprolol tartrate) .Marland Kitchen... Take 1 by mouth two times a day    Amlodipine Besylate 10 Mg Tabs (Amlodipine besylate) .Marland Kitchen... Take 1 by mouth once daily    Lisinopril 40 Mg Tabs (Lisinopril) .Marland Kitchen... Take 1 by mouth once daily    Hydrochlorothiazide 25 Mg Tabs (Hydrochlorothiazide) .Marland Kitchen... Take 1 by mouth once daily  Problem # 3:  PERIPHERAL VASCULAR DISEASE (ICD-443.9) He has documented peripheral vascular disease with indices of approximately 0.9 bilaterally. He does have calf pain with exertion but that most of his pain is at night and located on the side of his leg and doesn't sound like ischemic pain. His right big toe is slightly red but I don't see any ulceration or evidence of poor healing.  Patient Instructions: 1)  Your physician wants you to follow-up in: 6 months.  You will receive a reminder letter in the mail two months in advance. If you don't receive a letter, please call our office to schedule the follow-up appointment. 2)  You are being referred to Dr. Sherren Mocha for your Vascular disease.

## 2010-07-04 ENCOUNTER — Ambulatory Visit: Payer: Self-pay | Admitting: Pulmonary Disease

## 2010-07-11 NOTE — Assessment & Plan Note (Signed)
[place] OFFICE NOTE   SHANARD, DUTTA                     MRN:          [id number]  DATE:11/13/2007                            DOB:          1924-08-11    PRIMARY CARE PHYSICIAN:  Deborra Medina. Lenna Gilford, MD   CLINICAL HISTORY:  Mr. Giganti is 77 years old returned for followup  management of his coronary heart disease.  Dr. Coralie Keens had seen him  in the past.  In 2000, he had a coronary bypass surgery by Dr. Cyndia Bent.  He has done quite well since that time.  We did a Myoview scan on  him  last year which showed no evidence of ischemia.  He said he has had no  chest pain or palpitations.  He said he gets tired fairly easily, but  does not feel this is changed.   PAST MEDICAL HISTORY:  Significant for hyperlipidemia and diabetes.  He  also has thalassemia and had been seen by Dr. Beryle Beams for that and  is on chronic iron therapy with hemoglobins in the range of 11.2.   CURRENT MEDICATIONS:  1. Metformin.  2. Hydrochlorothiazide.  3. Lisinopril 40 mg daily.  4. Aspirin.  5. Amlodipine.  6. Simvastatin 40.  7. Iron.  8. Metoprolol 25 b.i.d.   PHYSICAL EXAMINATION:  VITAL SIGNS:  The blood pressure is 120/60 and  the pulse is 61 and regular.  NECK:  There was no venous distention.  Carotid pulses were full without  bruits.  CHEST:  Clear.  CARDIAC:  Rhythm is regular.  I hear no murmurs or gallops.  ABDOMEN:  Soft.  Normal bowel sounds.  EXTREMITIES:  Peripheral pulses were equal.  There is no peripheral  edema.   Electrocardiogram showed a small inferior Q-waves, which were slightly  more prominent than previous ECG.   IMPRESSION:  1. Coronary artery disease, status post coronary artery bypass graft      surgery in 2000, now stable.  2. Good left ventricular function.  3. Hypertension, under good control.  4. Hyperlipidemia.  5. Diabetes.  6. History of anemia.  7. Peripheral vascular  disease.   RECOMMENDATIONS:  I think Mr. Klase is doing well.  We will check  the labs that Dr. Lenna Gilford did.  He changed his iron dose, so we will get  an iron on him by the staff today.  I will plan to see him back in 6  months.      Bruce Alfonso Patten Olevia Perches, MD, [facility]  Electronically Signed    BRB/MedQ  DD: 11/13/2007  DT: 11/13/2007  Job #: [id number]   cc:   Deborra Medina. Lenna Gilford, MD

## 2010-07-11 NOTE — Assessment & Plan Note (Signed)
[place] OFFICE NOTE   Henry [name], Henry [name]                     MRN:          [id number]  DATE:05/07/2007                            DOB:          February 03, 1925    PRIMARY CARE PHYSICIAN:  Dr. Teressa Lower.   CLINICAL HISTORY:  Henry [name] is 77 years old and returns for follow-  up management of his coronary heart disease.  He had previously been  followed by Dr. Coralie Keens. He is retired from [employer].  He had bypass  surgery by Dr. Cyndia Bent in 2000 and has done quite well since that time.  He had some shortness of breath with I saw him last September and we did  a Myoview scan that was normal.  He had some mild anemia related to we  think thalassemia that is followed by Dr. Beryle Beams and this improved  on his last reading to a hemoglobin of 11.2.   He says he is doing quite well now and has had no chest pain and no  palpitations and his shortness of breath has improved.   PAST MEDICAL HISTORY:  Significant for hyperlipidemia, hypertension and  diabetes.  His peripheral vascular disease has been evaluated by Dr.  Albertine Patricia.  He has microcytic anemia followed by Dr. Beryle Beams and is on  iron. He has had a colonoscopy which showed no polyps.  His last LDL was  96 in February with Dr. Lenna Gilford.   CURRENT MEDICATIONS:  1. Metformin.  2. Hydrochlorothiazide 25 mg daily.  3. Lisinopril 40 mg daily.  4. Aspirin.  5. Amlodipine 10 mg daily.  6. Simvastatin 40 mg daily.  7. Iron.  8. Metoprolol 25 mg b.i.d.   PHYSICAL EXAMINATION:  Blood pressure is 132/62 and the pulse 66 and  regular.  There was no venous tension.  Carotid pulses were full without  bruits.  CHEST:  Clear without rales or rhonchi.  CARDIAC:  Rhythm regular. Heart sounds were normal. There were murmurs  or gallops.  ABDOMEN:  Soft with normal bowel sounds. There was no  hepatosplenomegaly.  Peripheral pulses were full. There was no peripheral  edema.   Electrocardiogram was normal.   IMPRESSION:  1. Coronary artery disease status post coronary bypass graft surgery      in 2000 now stable.  2. Hypertension under good control.  3. Hyperlipidemia with LDL not at target.  4. Diabetes.  5. History of anemia.  6. Peripheral vascular disease.   RECOMMENDATIONS:  I think Henry [name] is doing quite well from the  standpoint of his heart.  His LDL is not at target and I talked to him  about working harder on his diet versus switching from simvastatin to  one of the non generic drugs to get his LDL at target. He prefers to  work on diet.  He will follow-up with Dr. Lenna Gilford regarding that.  I will  plan to see him back in 6 months at his request.     Henry R. Olevia Perches, MD, [facility]  Electronically Signed    BRB/MedQ  DD: 05/07/2007  DT:  05/08/2007  Job #: [id number]   cc:   Deborra Medina. Lenna Gilford, MD

## 2010-07-11 NOTE — Assessment & Plan Note (Signed)
[place] OFFICE NOTE   Aukai, Delduca THO RADICE                     MRN:          [id number]  DATE:11/01/2006                            DOB:          1924/08/14    PRIMARY CARE PHYSICIAN:  Deborra Medina. Lenna Gilford, M.D.   CLINICAL HISTORY:  Mr. Varano is 77 years old and is seen by me for  the first time after previously being seen by E. Raymond Gurney, MD,  [facility].  He is retired from [employer] and lives at home with his wife.  He had  a bypass surgery performed by Gilford Raid, M.D. in 2000.  He has done  well since that time, but he says over the past two months he has  developed some shortness of breath with exertion such as walking to the  mailbox.  He also has some associated dizziness which he describes as  lightheaded.  This has not occurred other times.  He has had no  associated palpitations.   PAST MEDICAL HISTORY:  Significant for hyperlipidemia, peripheral  vascular disease for which he has been seen by Ethelle Lyon, M.D.,  diabetes and hypertension.  He also has a history of anemia and has been  on iron.  He has had a colonoscopy which showed no polyps which was  performed by Dr. Henrene Pastor.   CURRENT MEDICATIONS:  Include metoprolol, Metformin,  hydrochlorothiazide, iron, lisinopril, aspirin, amlodipine, and  Simvastatin.   PHYSICAL EXAMINATION:  VITAL SIGNS:  Blood pressure 120/51, pulse 76 and  regular.  NECK:  There was no jugular venous distention.  The carotid pulses were  full and I could hear no bruits.  CHEST:  Clear without rales or rhonchi.  HEART:  Rhythm was regular.  Heart sounds were normal.  I could hear no  murmurs or gallops.  ABDOMEN:  Soft without organomegaly.  Peripheral pulses were full and  there was no peripheral edema.   Electrocardiogram was normal except for first degree AV block.   IMPRESSION:  1. Exertional dyspnea and dizziness of uncertain etiology.  2. Coronary artery  disease status post coronary artery bypass graft      surgery in 2000.  3. Hypertension.  4. Hyperlipidemia.  5. Diabetes.  6. History of iron deficiency anemia.  7. Peripheral vascular disease.   RECOMMENDATIONS:  I am concerned about the patient's shortness of breath  with exertion and I am concerned that this might be an ischemic  equivalent.  He wonders if this might be anemia and he indicated that he  is [place] and Italian's sometimes have thalassemia.  We will plan to  get blood tests to check for anemia including a CBC, reticulocyte count,  BMP, BNP.  We will also get an exercise rest stress Myoview scan.  We  will be in touch with him after we have the results.  I will give him a  follow-up in 6 months, but will alter that depending on the results of  this test.   ADDENDUM:  Mr. Sixtos and his wife have five children, one of whom  lives in  [place].     Bruce Alfonso Patten Olevia Perches, MD, [facility]  Electronically Signed    BRB/MedQ  DD: 11/01/2006  DT: 11/01/2006  Job #: [id number]

## 2010-07-14 NOTE — Assessment & Plan Note (Signed)
[place] OFFICE NOTE   Henry [name], Henry [name]                     MRN:          [id number]  DATE:11/27/2005                            DOB:          May 26, 1924   OFFICE CONSULTATION NOTE   REFERRING PHYSICIAN:  Signa Kell, MD, [facility]   REASON FOR CONSULTATION:  Anemia, question need for screening colonoscopy.   HISTORY:  This is a pleasant 77 year old white male with a history of  hypertension, diabetes mellitus, dyslipidemia, and coronary artery disease  for which he has undergone coronary artery bypass grafting remotely.  He is  referred, today through the courtesy of Dr. Velora Heckler regarding anemia and  consideration for screening colonoscopy.   The patient was found to be anemia in May 2007 with a but of 11.0.  His MCV  was normal at 90.2.  His iron levels were said to be low normal, though I do  not see documentation.  He does appear to have mild renal insufficiency with  BUN of 30 and creatinine of 1.8.  He was evaluated by Dr. Velora Heckler on  11/06/2005.  At that time repeat hemoglobin was obtained and found to be  slightly improved though still low at 11.5.  The patient has been on iron  since May.  I was contacted by Dr. Velora Heckler, who was concerned; and after  discussing things with the patient, felt surveillance colonoscopy might be  indicated.  The patient has had some intermittent fatigue; however, he  denies any particular GI complaints, such as melena, hematochezia,  heartburn, dysphagia, abdominal pain, change in bowel habits or weight loss.  He does take aspirin, but denies nonsteroidal antiinflammatory drug use.  He  did undergo flexible sigmoidoscopy in 1995 which showed diverticulosis.  Colonoscopy performed in May 2001 also revealed diverticulosis.  No  neoplasia.   PAST MEDICAL HISTORY:  As outlined above.  In addition, bilateral cataract  surgery.   ALLERGIES:  No known drug  allergies.   CURRENT MEDICATIONS:  1. Metoprolol 25 mg b.i.d.  2. Metformin 500 mg b.i.d.  3. Hydrochlorothiazide 25 mg daily.  4. Multivitamin daily.  5. Iron 325 mg daily.  6. Vytorin 10 mg daily.  7. Lisinopril 40 mg daily.  8. Aspirin 325 mg daily.  9. Amlodipine 10 mg daily.   FAMILY HISTORY:  No family history of colon cancer or colon polyps.  Parents  with diabetes.   SOCIAL HISTORY:  The patient is married with 5 children.  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  He does not smoke, rarely uses alcohol.   PHYSICAL EXAMINATION:  Per diagnostic evaluation form.   PHYSICAL EXAMINATION:  A well-appearing elderly gentleman in no acute  distress.  He is alert and oriented.  His blood pressure is 104/50, heart rate is 48 and regular.  His weight is  166 pounds.  He is 5 feet 9 inches in height.  HEENT:  Sclerae are anicteric.  His Conjunctivae are pink.  His oral mucosa  is intact.  There is no aphthous ulcerations or telangiectasias.  LUNGS:  Clear to auscultation and percussion.  HEART:  Regular without murmur.  ABDOMEN:  Soft, nontender, and nondistended with good bowel sounds.  No  obvious organomegaly, mass, or hernia.  RECTAL EXAM:  Deferred.  EXTREMITIES:  Without edema.   IMPRESSION:  This is a 77 year old gentleman who presents with a normocytic  anemia.  Negative screening colonoscopy, 6-1/2 years previous.  I had a long  discussion with the patient today, and told him that his anemia was possibly  iron-deficiency, but more likely that of chronic disease.  There may be some  relationship to his apparent renal insufficiency.  I did, however, tell him  that I could not entirely exclude an occult GI process attributing to an  intermittent blood loss and anemia though we have no direct evidence.  We  discussed prospects of colonoscopy since it has been over 6 years since his  last exam.  I  discussed with him, in particular, the risks, possible  benefits and alternative modes of diagnosis.  Understanding all of this, it  was his preference and comfort to proceed with colonoscopy, as had been  suggested by Dr. Signa Kell.  For the exam we would like to hold his  iron therapy 1 week prior; and the day of his exam hold his oral diabetic  medications.  We will provide MiraLax prep.  If the examination is negative,  he should return to Dr. Lenna Gilford for further evaluation of anemia as Dr. Lenna Gilford  sees fit.            ______________________________  Docia Chuck. Geri Seminole., MD     JNP/MedQ  DD:  11/27/2005  DT:  11/28/2005  Job #:  [id number]   cc:   Signa Kell, MD, [facility]  [name]. Lenna Gilford, MD

## 2010-07-14 NOTE — Assessment & Plan Note (Signed)
[place] OFFICE NOTE   Henry [name], Henry [name]                     MRN:          [id number]  DATE:11/06/2005                            DOB:          [birth year]    A very pleasant 77 year old white married male 7-1/2 years post coronary  artery bypass graft surgery by Dr. Gilford Raid because of significant left  main and three vessel coronary disease.  The patient remained quite  asymptomatic.  He has hyperlipidemia, some mild peripheral vascular disease  for which he is seen by Dr. Albertine Patricia.  He also has diabetes and hypertension.  The patient is feeling well, active, and having no cardiac symptoms.  He did  note that in May his hematocrit was 32.5, hemoglobin 11.  His iron was only  low normal.  He was started on ferrous sulfate.  He had a colonoscopy most  recently in February 2001 by Dr. Henrene Pastor revealing diverticulosis, and  otherwise normal.  He has had no GI symptoms, but he does have diabetes.   MEDICATIONS:  1. Metformin 500 b.i.d.  2. Metoprolol 25 b.i.d.  3. Hydrochlorothiazide 25.  4. Vytorin 10/40 with LDL well controlled.  5. Lisinopril 40.  6. Felodipine 10.  7. Aspirin 325.  8. Ferrous sulfate 162 daily.   PHYSICAL EXAMINATION:  VITAL SIGNS:  Blood pressure 114/53, pulse 65, normal  sinus rhythm.  GENERAL APPEARANCE:  Normal.  NECK:  JVP is not elevated.  Carotid pulses without bruits.  LUNGS:  Clear.  CARDIAC:  Normal.  No murmur or gallop.  ABDOMEN:  Normal.  Liver, spleen and kidney not palpable.  EXTREMITIES:  Normal.   IMPRESSION:  1. Coronary artery disease, 7-1/2 years post coronary artery bypass graft,      asymptomatic.  2. Hyperlipidemia, controlled.  3. Hypertension, controlled.  4. Diabetes.  5. Anemia, on iron.   I suggested BNP and lipid profile and hemoglobin and hematocrit.  I will  also talk with Dr. Henrene Pastor concerning the possible necessity for followup  colon.   His EKG reveals normal sinus rhythm with first degree AV block, but  otherwise normal.  Will plan to see him back in 6 months.                              Signa Kell, MD, [facility]    EJL/MedQ  DD:  11/06/2005  DT:  11/06/2005  Job #:  [id number]

## 2010-07-14 NOTE — Assessment & Plan Note (Signed)
[place] OFFICE NOTE   NAME:Weihe, ALRICK WAIBEL                     MRN:          [id number]  DATE:04/30/2006                            DOB:          09/24/1924    Mr. Vito is a very pleasant almost 77 year old white married male,  8 years post CABG by Dr. Cyndia Bent because of significant left main and 3  vessel coronary disease.  The patient has done extremely well and has  had no recurrent symptoms.   HISTORY:  1. Hyperlipidemia, mild.  2. Peripheral vascular disease, for which he is seen by Dr. Albertine Patricia.  3. Also has diabetes hypertension, which has been well controlled.   He has had chronic anemia and recently started on Aranesp by Dr. Lenna Gilford.   OTHER MEDICATIONS:  1. Metoprolol 25 b.i.d.  2. Metformin 500 b.i.d.  3. HCTZ 25.  4. Ferrous sulfate.  5. Lisinopril 40.  6. Vytorin 10/40.  7. Aspirin 325.  8. Amlodipine 10 mg.   The patient has some concern about the Vytorin and I reviewed this with  him.  His recent BUN was 29, creatinine 1.6, hemoglobin was 9.5.  He has  had previous abdominal ultrasound revealing no aneurysm.   PHYSICAL EXAMINATION:  Blood pressure 122/51, pulse 62, normal sinus  rhythm.  GENERAL APPEARANCE:  Normal.  JVP is not elevated and carotid pulses are palpable.  He is without  bruits.  LUNGS:  Clear.  CARDIAC:  Normal, no murmur, or gallops, or rub.  ABDOMINAL:  Liver, spleen, and kidney not palpable, no masses  EXTREMITIES:  Are normal with no edema.   IMPRESSION:  1. Coronary artery disease, 8 years post coronary artery bypass      grafting by Dr. Enrigue Catena.  2. Hyperlipidemia.  3. Hypertension, controlled.  4. Diabetes.  5. Anemia.  6. Now peripheral vascular disease.   PLAN:  As it has been 8 years post CABG I have suggested a stress  Myoview.  because of Some questions about the Vytorin , we will change  him to simvastatin 40, recheck his lipids  and LFTs in 3 months.  Possibly increase the simvastatin if needed.   I will have the patient seen for followup in approximately 6 months with  Dr. Burt Knack or one of my other associates.     Signa Kell, MD, [facility]  Electronically Signed    EJL/MedQ  DD: 04/30/2006  DT: 04/30/2006  Job #: [id number]

## 2010-07-21 ENCOUNTER — Encounter: Payer: Self-pay | Admitting: Pulmonary Disease

## 2010-08-02 ENCOUNTER — Encounter: Payer: Self-pay | Admitting: Pulmonary Disease

## 2010-08-02 ENCOUNTER — Ambulatory Visit (INDEPENDENT_AMBULATORY_CARE_PROVIDER_SITE_OTHER): Payer: Medicare Other | Admitting: Pulmonary Disease

## 2010-08-02 DIAGNOSIS — D638 Anemia in other chronic diseases classified elsewhere: Secondary | ICD-10-CM

## 2010-08-02 DIAGNOSIS — N259 Disorder resulting from impaired renal tubular function, unspecified: Secondary | ICD-10-CM

## 2010-08-02 DIAGNOSIS — I25810 Atherosclerosis of coronary artery bypass graft(s) without angina pectoris: Secondary | ICD-10-CM

## 2010-08-02 DIAGNOSIS — M1990 Unspecified osteoarthritis, unspecified site: Secondary | ICD-10-CM

## 2010-08-02 DIAGNOSIS — I7389 Other specified peripheral vascular diseases: Secondary | ICD-10-CM

## 2010-08-02 DIAGNOSIS — I10 Essential (primary) hypertension: Secondary | ICD-10-CM

## 2010-08-02 DIAGNOSIS — E119 Type 2 diabetes mellitus without complications: Secondary | ICD-10-CM

## 2010-08-02 DIAGNOSIS — I6529 Occlusion and stenosis of unspecified carotid artery: Secondary | ICD-10-CM

## 2010-08-02 DIAGNOSIS — E7800 Pure hypercholesterolemia, unspecified: Secondary | ICD-10-CM

## 2010-08-02 NOTE — Progress Notes (Signed)
Subjective:    Patient ID: Henry [name], male    DOB: [birth year], 77 y.o.   MRN: [id number]  HPI 77 y/o WM here for a follow up visit... he has mult med problems as listed and is followed by DrBBrodie and DrPerry as well... he gets his meds filled at the [facility] which makes for a complicated array of perscribed meds and substituted alternatives...  ~  December 07, 2008:  saw DrBB for Cards f/u 5/10- doing satis, no changes made... today c/o some leg cramps at night & we discussed the Tonic water/ Mustard/ Soap bar tricks... he has PVD and legs also "tighten up" while walking- stable pattern, no worsening, he continues to exercise w/ treadmill etc...  some incr stress caring for wife w/ memory problems... OK Flu shot today.  ~  June 07, 2009:  true to form the [place] has changed his Metformin to Glyburide- initially 5mg /d then had to decr to 1/2 tab Qam due to side effects... he notes sugars are "up & down" at home> he pays $9 per Rx at [place] I encouraged him to look into the [facility]/ [facility] $4 lists... for now we will continue the [place] meds so as not to confuse &/ or conflict the pt any further... he saw DrBBrodie4/11- stable, no angina... referred to [facility] [name] for f/u PVD> art dopplers 11/10 w/ >50% SFA stenosis & ABI's 0.9.Marland Kitchen. he continues on exercise program w/ stable claud symptoms...  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 we discussed trial Neurontin for the burning but he wants tothink about it- alternatively we could send him to Neurology for further eval... he saw DrBB for another Cards f/u 10/11- remains stable & no changes made...  he notes that the [facility] changed his Simva40 to [place] f/u FLP looks good...  he states that BS continued to drop <50  on the Glipizide2.5mg  from the [facility] so he stopped this med & f/u FBS=149, A1c=6.7 on diet alone (can't take Metformin  due to renal insuffic)...  ~  August 02, 2010:  52mo ROV & [name] has been going to the [facility] for his meds & his care> his med list indicates that they stopped his Lisinopril (?due to renal insuffic) & substituted Losartan 25mg /d, and they restarted his HCTZ at 12.5mg /d due to edema... His BP remains well controlled, but he notes some dizziness & light headed in the AM on arising since the changes were made> he is asked to monitor BP at home (supine & standing), change positions slowly, & he has f/u at the [place] next month (he will bring his labs for [place] to review)...  Another issue is the right sm toe> he has a lesion (?wart-like growth) on the medial side of the toe & it rubs against his 4th toe w/ pain/ discomfort> advised to keep cotton vs gauze betw toes to decr rubbing & consider Podiatric consult but I voiced caution w/ any surg approach due to his periph vasc dis (I noted that non-healing of any surg could lead to ray amput or worse)... He saw [name] 12/11 last- note reviewed... He is under mod stress caring for wife w/ dementia & he does all the cooking etc..Marland Kitchen  Problem List:  HYPERTENSION (ICD-401.9) - controlled on 4 meds:  METOPROLOL 25mg Bid, AMLODIPINE 10mg /d, LOSARTAN 25mg /d, and HCT 25mg - 1/2 qd... BP=128/68 and doing well except he notes sl dizzy in the AM... denies HA, fatigue, visual changes, CP, palipit, syncope, dyspnea, edema, etc... ~  2DEcho 1996 showed mild LVH, AoV sclerosis & mild AI, EF= 60%... ~  labs 11/11 showed BUN=36, Creat=2.1, K=5.1.Marland KitchenMarland Kitchen rec to stop HCTZ... ~  The [facility] re-started 12.5mg  HCTZ in the interim due to edema...  ARTERIOSCLEROTIC HEART DISEASE (ICD-414.00) - on ASA 325mg /d... long time pt of DrJoeLeBauer & followed by DrBrodie now... s/p CABG in 2000 by DrBartle (for L main dis)... he is active & w/o angina, palpit, change in SOB/ DOE, etc... ~   NuclearStressTest 9/08 was normal without ischemia and EF=68%...  CAROTID ARTERY DISEASE>  PERIPHERAL VASCULAR DISEASE (ICD-443.9) - on ASA daily and he has bilat femoral bruits... he has seen DrDowney in the past (now [name]) w/ exerc program recommended... he walks regularly w/ stable claud symptoms, no sores or lesions on LE's... ~  ABI's 10/08 were normal bilat = 1.0.Marland KitchenMarland Kitchen waveforms were biphasic however (no change from 2005). ~  CDoppler's 10/08 showed mild bilat carotid dis w/ 40-59% RICA stenosis, and [id number] LICA stenosis. ~  CDoppler's 10/09 showed the same= stable. ~  CDoppler's 11/10 showed stable mild carotid dis bilat> [id number] RICA & [id number] LICA stenoses. ~  ABI's 11/10 showed prob mod severe >50% distal right SFA stenosis, & mod dis in left SFA, ABI's=0.9 ~  Podiarty sent him to [facility] for ArtDopplers 9/11> ABIs 0.82 R & 0.74L w/ bilat SFA dis L>R, toe pressures adeq for healing...  HYPERCHOLESTEROLEMIA (ICD-272.0) - prev controlled on Simva40 + diet, [facility] CRESTOR 10mg /d. ~  [place] 2/09 on Simva40 showed Tchol 169, TG 206, HDL 37, LDL 96 ~  FLP 4/10 on Simva40 showed TChol 146, TG 145, HDL 36, LDL 82 ~  FLP 10/10 on Simva40 showed TChol 166, TG 130, HDL 38, LDL 102 ~  FLP 4/11 on Simva40 showed TChol 138, TG 87, HDL 40, LDL 80... [facility] subseq ch to Cres10 ~  FLP 11/11 on Cres10 showed TChol 147, TG 161, HDL 34, LDL 81 ~  6/12:  He indicates interval labs done by the [facility] but he didn't bring copies for [place] to review...  DIABETES MELLITUS (ICD-250.00) - he's struggled w/ diet Rx... wt stable ~175 range... prev on Metformin 500Bid but [facility] changed to GLYBURIDE 5mg /d, then decr to 1/2 tab daily due to side effects & he stopped due to hypogly. ~  labs 10/08 showed FBS= 134, and HgA1c= 5.4... ~  labs 6/09 showed BS= 101, HgA1c= 6.4.Marland KitchenMarland Kitchen monofilament test is normal. ~  Eye eval 1/10 at [facility] showed no DM retinopathy... ~  labs 4/10 on MetformBid showed BS= 106, A1c= 6.8, Urine  Microalb= sl incr @ 7.8mg % ~  labs 10/10 showed BS= 119, A1c= 6.5...  NOTE: 2/11 [facility] ch to Glybur5mg - 1/2 daily. ~  labs 4/11 on Glyb2.5 showed BS= 97, A1c= 6.6.Marland KitchenMarland Kitchen pt subseq stopped Glybur due to low sugars. ~  labs 11/11 off meds showed BS= 149, A1c= 6.7.Marland KitchenMarland Kitchen coninue diet Rx. ~  6/12:  He indicates interim labs done by the [facility] but he didn't bring copies for [place] to review...  RENAL INSUFFICIENCY (ICD-588.9) - Creat 1.8-2.0 in the past... ~  labs 6/09 showed BUN= 31, Creat= 1.6... ~  labs 9/09 showed BUN= 25, Creat= 1.6, K= 4.5 ~  labs 4/10 showed BUN= 26, Creat= 1.6, K= 5.0 ~  labs 10/10 showed BUN= 21, Creat= 1.6 ~  labs 4/11 showed BUN= 30, Creat= 1.9..Marland Kitchen advised to stay well hydrated. ~  labs 11/11 showed BUN= 36, Creat= 2.1.Marland KitchenMarland Kitchen rec to stop the HCTZ ~  6/12:  He indicates that the [facility] restarted HCTZ 12.5mg /d & they are following labs...  DEGENERATIVE JOINT DISEASE (ICD-715.90)  Hx of ANEMIA OF CHRONIC DISEASE (ICD-285.29) - eval by DrGranfortuna w/ ACD secondary to his renal insuffic and Rx w/ aranesp periodically... nothing else found on his extensive eval... he was taking Ferrous Gluconate daily (it no longer makes him tired)... ~  labs 2/09 w/ Hg= 11.2.Marland Kitchen. ~  labs 6/09 w/ Hg= 11.0.Marland Kitchen. rec> OTC Fe prep but he never got it! ~  labs 9/09 showed Hg= 10.5, Fe= 81 ~  labs 4/10 showed Hg= 10.5, MCV= 92, Fe= 77 (22%sat), B12= 590, Folate>20... rec> OTC Fe daily. ~  labs 10/10 showed Hg= 10.3, Fe= 88 ~  labs 4/11 showed Hg= 10.9,  Fe= 58... continue Ferrous Gluc daily w/ VitC ~  labs 11/11 showed Hg= 11.6 ~  6/12:  Again he indicated that the [facility] is following his labs, aske to bring copies for [place] to review.   Past Surgical History  Procedure Date  . Cataract surgery   . Coronary artery bypass graft 2000    by Dr. Cyndia Bent    Outpatient Encounter Prescriptions as of 08/02/2010  Medication Sig Dispense Refill  . amLODipine (NORVASC) 10 MG tablet Take 10 mg by mouth daily.         . Ascorbic Acid (VITAMIN C) 500 MG tablet Take 500 mg by mouth daily.        Marland Kitchen aspirin 325 MG EC tablet Take 325 mg by mouth daily.        . ferrous gluconate (FERGON) 325 MG tablet Take 325 mg by mouth daily with breakfast.        . losartan (COZAAR) 25 MG tablet Take 25 mg by mouth daily.        . metoprolol tartrate (LOPRESSOR) 25 MG tablet Take 25 mg by mouth 2 (two) times daily.        . Multiple Vitamins-Minerals (CENTRUM SILVER PO) Take 1 tablet by mouth daily.        . rosuvastatin (CRESTOR) 10 MG tablet Take 10 mg by mouth daily.        Marland Kitchen lisinopril (PRINIVIL,ZESTRIL) 40 MG tablet Take 40 mg by mouth daily.          No Known Allergies   Review of Systems        See HPI - all other systems neg except as noted... The patient complains of decreased hearing, dyspnea on exertion, and difficulty walking.  The patient denies anorexia, fever, weight loss, weight gain, vision loss, hoarseness, chest pain, syncope, peripheral edema, prolonged cough, headaches, hemoptysis, abdominal pain, melena, hematochezia, severe indigestion/heartburn, hematuria, incontinence, muscle weakness, suspicious skin lesions, transient blindness, depression, unusual weight change, abnormal bleeding, enlarged lymph nodes, and angioedema.    Objective:   Physical Exam     WD, WN, 77 y/o WM in NAD... GENERAL:  Alert & oriented; pleasant & cooperative... HEENT:  [place]/AT, EOM-wnl, PERRLA, EACs-clear, TMs-wnl, NOSE-clear, THROAT-clear & wnl. NECK:  Supple w/ fairROM; no JVD; normal carotid impulses w/ faint right bruit; no thyromegaly or nodules palpated; no lymphadenopathy. CHEST:  Clear to P & A; without wheezes/ rales/ or rhonchi heard... HEART:  Regular, gr 1/6 SEM without rubs or gallops heard... ABDOMEN:  Soft & nontender; normal bowel sounds; no organomegaly or masses detected. EXT: without deformities, mod arthritic changes; no varicose veins/ venous insuffic/ or edema. he has bilat fem art bruits & minor  pressure area betw right 4th & 5th toes... NEURO:  CN's intact;  no focal neuro deficits... DERM:  Lesion on medial side of right small toe, sl tender...   Assessment & Plan:   HBP>  BP remains controlled on the current regimen as it was on the prev regimen, meds adjusted by the [facility]...  ASHD/ CAD>  Followed by [name] for LeB Cards & the [facility]; continue current meds...  ASPVD>  Decr ABIs & Carotid dis> on ASA 325mg /d...  CHOL>  On Cres10 & FLP followed by the [facility] now he says...  DM>  On diet alone & labs also followed by there [place]...  Renal Insuffic>  I explained why I stopped his HCT last Ov due to Creat 2.1, the [facility] has restarted 12.5mg /d & they are following his renal function...  Anemia>  Similarly they are following his CBC etc... Pt is asked to get copies of all his l;abs for opur records.

## 2010-08-04 ENCOUNTER — Encounter: Payer: Self-pay | Admitting: Pulmonary Disease

## 2010-08-04 NOTE — Patient Instructions (Signed)
We updated your med list in EPIC...    Continue your current meds as outlined for you by your doctors at the [facility]...  Please get copies of you labs for [place] to review with you...  Call for any questions...  We would be happy to continue seeing you here in our clinic for local follow up.Henry KitchenMarland [name]

## 2011-01-17 ENCOUNTER — Other Ambulatory Visit: Payer: Self-pay | Admitting: Pulmonary Disease

## 2011-02-21 ENCOUNTER — Ambulatory Visit: Payer: Medicare Other | Admitting: Cardiovascular Disease

## 2011-02-22 ENCOUNTER — Encounter: Payer: Self-pay | Admitting: Cardiovascular Disease

## 2011-02-22 ENCOUNTER — Ambulatory Visit (INDEPENDENT_AMBULATORY_CARE_PROVIDER_SITE_OTHER): Payer: Medicare Other | Admitting: Cardiovascular Disease

## 2011-02-22 DIAGNOSIS — I6529 Occlusion and stenosis of unspecified carotid artery: Secondary | ICD-10-CM

## 2011-02-22 DIAGNOSIS — I25810 Atherosclerosis of coronary artery bypass graft(s) without angina pectoris: Secondary | ICD-10-CM

## 2011-02-22 DIAGNOSIS — I7389 Other specified peripheral vascular diseases: Secondary | ICD-10-CM

## 2011-02-22 LAB — BASIC METABOLIC PANEL
CO2: 27 mEq/L (ref 19–32)
Calcium: 9 mg/dL (ref 8.4–10.5)
GFR: 39.07 mL/min — ABNORMAL LOW (ref >60.00)
Potassium: 3.6 mEq/L (ref 3.5–5.1)
Sodium: 138 mEq/L (ref 135–145)

## 2011-02-22 NOTE — Patient Instructions (Signed)
Your physician recommends that you have lab work today: BMP and Magnesium  Your physician has requested that you have a carotid duplex. This test is an ultrasound of the carotid arteries in your neck. It looks at blood flow through these arteries that supply the brain with blood. Allow one hour for this exam. There are no restrictions or special instructions.  Your physician has requested that you have a lower extremity arterial duplex. This test is an ultrasound of the arteries in the legs. It looks at arterial blood flow in the legs. Allow one hour for Lower Arterial scans. There are no restrictions or special instructions  Your physician has requested that you have an ankle brachial index (ABI). During this test an ultrasound and blood pressure cuff are used to evaluate the arteries that supply the arms and legs with blood. Allow thirty minutes for this exam. There are no restrictions or special instructions.  Your physician wants you to follow-up in: 1 YEAR.  You will receive a reminder letter in the mail two months in advance. If you don't receive a letter, please call our office to schedule the follow-up appointment.

## 2011-02-22 NOTE — Progress Notes (Signed)
HPI:  77 year old gentleman presenting for followup evaluation. The patient has coronary artery disease and is status post Courier bypass surgery in 2000.  He also has lower extremity peripheral arterial disease and intermittent claudication. ABIs in 2011 were 0.82 on the right and 0.74 on the left. He has been managed medically.  The patient denies chest pain or pressure. He denies dyspnea, orthopnea, PND, or palpitations. He's been under increased stress at home related to his wife's Alzheimer's disease. The patient continues to complain of bilateral calf pain with ambulation. His symptoms are worse in the right leg than the left and it feels like pressure sensation when he walks a distance of less than one block. He also is complaining of nocturnal leg cramps. He has had no ulcerations of the feet. He denies stroke or TIA symptoms.  Outpatient Encounter Prescriptions as of 02/22/2011  Medication Sig Dispense Refill  . ACCU-CHEK AVIVA PLUS test strip CHECK BLOOD SUGAR TWICE A DAY.  50 each  6  . amLODipine (NORVASC) 10 MG tablet Take 10 mg by mouth daily.        . Ascorbic Acid (VITAMIN C) 500 MG tablet Take 500 mg by mouth daily.        Marland Kitchen aspirin 325 MG EC tablet Take 325 mg by mouth daily.        . ferrous gluconate (FERGON) 325 MG tablet Take 325 mg by mouth daily with breakfast.        . losartan (COZAAR) 25 MG tablet Take 25 mg by mouth daily.        . metoprolol tartrate (LOPRESSOR) 25 MG tablet Take 25 mg by mouth 2 (two) times daily.        . Multiple Vitamins-Minerals (CENTRUM SILVER PO) Take 1 tablet by mouth daily.        . rosuvastatin (CRESTOR) 10 MG tablet Take 10 mg by mouth daily.        Marland Kitchen DISCONTD: hydrochlorothiazide 25 MG tablet Take 1/2 tablet by mouth once daily         No Known Allergies  Past Medical History  Diagnosis Date  . Hypertension   . CAD of autologous bypass graft   . Carotid artery disease   . PVD (peripheral vascular disease) with claudication   .  Hypercholesteremia   . Renal insufficiency   . DJD (degenerative joint disease)   . History of anemia of chronic disease     ROS: Negative except as per HPI  BP 140/56  Pulse 73  Ht 5\' 9"  (1.753 m)  Wt 76.658 kg (169 lb)  BMI 24.96 kg/m2  PHYSICAL EXAM: Pt is alert and oriented, pleasant elderly male in NAD HEENT: normal Neck: JVP - normal, carotids 2+= with soft bilateral bruits Lungs: CTA bilaterally CV: RRR with grade 1/6 systolic ejection murmur at the left sternal border Abd: soft, NT, Positive BS, no hepatomegaly Ext: 1+ ankle edema bilaterally, pedal pulses are diminished bilaterally. Skin: warm/dry no rash  EKG:  Sinus rhythm 73 beats per minute, first degree AV block, possible left atrial enlargement.  ASSESSMENT AND PLAN:

## 2011-02-22 NOTE — Assessment & Plan Note (Signed)
The patient has slowly progressive intermittent claudication. I doubt his nocturnal leg cramps are related to vascular disease. Will repeat ABIs with duplex scanning. Recommended increased fluid intake during the day. We'll check a metabolic panel to include potassium and magnesium levels make sure the electrolyte deficiencies are not responsible for his muscle cramps.

## 2011-02-22 NOTE — Assessment & Plan Note (Signed)
The patient is stable without angina. We'll continue his current medical program without changes. His lipids are followed through the [facility] medical system. He remains on antiplatelet therapy with aspirin.

## 2011-02-22 NOTE — Assessment & Plan Note (Signed)
Patient with history of moderate internal carotid artery stenosis. He is due for followup carotid duplex scanning. He has no stroke or TIA symptoms and continues on medical therapy.

## 2011-03-09 ENCOUNTER — Encounter (INDEPENDENT_AMBULATORY_CARE_PROVIDER_SITE_OTHER): Payer: Medicare Other | Admitting: *Deleted

## 2011-03-09 ENCOUNTER — Encounter: Payer: Medicare Other | Admitting: *Deleted

## 2011-03-09 DIAGNOSIS — I6529 Occlusion and stenosis of unspecified carotid artery: Secondary | ICD-10-CM

## 2011-03-13 ENCOUNTER — Encounter (INDEPENDENT_AMBULATORY_CARE_PROVIDER_SITE_OTHER): Payer: Medicare Other | Admitting: *Deleted

## 2011-03-13 DIAGNOSIS — I7389 Other specified peripheral vascular diseases: Secondary | ICD-10-CM

## 2011-03-13 DIAGNOSIS — I739 Peripheral vascular disease, unspecified: Secondary | ICD-10-CM | POA: Diagnosis not present

## 2011-03-22 ENCOUNTER — Telehealth: Payer: Self-pay | Admitting: Cardiovascular Disease

## 2011-03-22 NOTE — Telephone Encounter (Signed)
Lucile Crater LPN made pt aware of [facility]A with ABI results.  Copy of this study and carotid mailed to the pt's home.

## 2011-03-22 NOTE — Telephone Encounter (Signed)
Fu call Pt calling you back about tests results and he wants results mailed to him

## 2012-02-14 ENCOUNTER — Encounter: Payer: Self-pay | Admitting: Cardiovascular Disease

## 2012-02-14 ENCOUNTER — Ambulatory Visit (INDEPENDENT_AMBULATORY_CARE_PROVIDER_SITE_OTHER): Payer: Medicare Other | Admitting: Cardiovascular Disease

## 2012-02-14 VITALS — BP 150/64 | HR 57 | Ht 69.0 in | Wt 162.0 lb

## 2012-02-14 DIAGNOSIS — I70219 Atherosclerosis of native arteries of extremities with intermittent claudication, unspecified extremity: Secondary | ICD-10-CM | POA: Diagnosis not present

## 2012-02-14 DIAGNOSIS — I10 Essential (primary) hypertension: Secondary | ICD-10-CM | POA: Diagnosis not present

## 2012-02-14 DIAGNOSIS — E7800 Pure hypercholesterolemia, unspecified: Secondary | ICD-10-CM | POA: Diagnosis not present

## 2012-02-14 DIAGNOSIS — I25810 Atherosclerosis of coronary artery bypass graft(s) without angina pectoris: Secondary | ICD-10-CM

## 2012-02-14 MED ORDER — LOSARTAN POTASSIUM 50 MG PO TABS: 50.0000 mg | ORAL_TABLET | Freq: Every day | ORAL | Status: DC

## 2012-02-14 NOTE — Patient Instructions (Addendum)
Your physician has recommended you make the following change in your medication: INCREASE Losartan to 50mg  take one by mouth daily  Your physician recommends that you return for a FASTING LIPID, LIVER and BMP in 2 WEEKS--nothing to eat or drink after midnight, lab opens at 7:30 (02/28/12)  Your physician wants you to follow-up in: 1 YEAR with Dr Burt Knack. You will receive a reminder letter in the mail two months in advance. If you don't receive a letter, please call our office to schedule the follow-up appointment.  Your physician has requested that you have an ankle brachial index (ABI) in 1 YEAR. During this test an ultrasound and blood pressure cuff are used to evaluate the arteries that supply the arms and legs with blood. Allow thirty minutes for this exam. There are no restrictions or special instructions.

## 2012-02-14 NOTE — Progress Notes (Signed)
HPI:  78 year old gentleman presenting for followup evaluation. The patient has coronary artery disease and he is now 13 years out from multivessel CABG. He's also followed for lower extremity PAD. ABIs in 2011 were 0.92 on the right and 0.80 on the left. The patient has been managed medically. His lipids of been followed through the [facility] medical system. Carotid ultrasound was done in January and it showed 40-59% stenosis bilaterally.  The patient continues to have bilateral calf pain with ambulation. The legs are affected equally. He generally has discomfort early on and walking and then he rests for a few minutes. He can then go for longer distance. He has not had rest pain or ulcers on his feet. He denies chest pain or dyspnea. He has no other complaints.  Outpatient Encounter Prescriptions as of 02/14/2012  Medication Sig Dispense Refill  . amLODipine (NORVASC) 10 MG tablet Take 10 mg by mouth daily.        . Ascorbic Acid (VITAMIN C PO) Take one tablet of CENTRUM SILVER VITAMIN C in the morning      . aspirin 325 MG EC tablet Take 325 mg by mouth daily.        . Calcium Carbonate-Vitamin D (CALCIUM 600/VITAMIN D PO) Take 1 calcium 600 mg vitamin D in the morning and one in the evening as directed      . ferrous gluconate (FERGON) 325 MG tablet Take 325 mg by mouth daily with breakfast.        . losartan (COZAAR) 50 MG tablet Take 1 tablet (50 mg total) by mouth daily.  90 tablet  3  . metoprolol tartrate (LOPRESSOR) 25 MG tablet Take 25 mg by mouth 2 (two) times daily.        . Multiple Vitamins-Minerals (CENTRUM SILVER PO) Take 1 tablet by mouth daily.        . rosuvastatin (CRESTOR) 10 MG tablet Take 10 mg by mouth daily.        . [DISCONTINUED] losartan (COZAAR) 25 MG tablet Take 25 mg by mouth daily.        Marland Kitchen ACCU-CHEK AVIVA PLUS test strip CHECK BLOOD SUGAR TWICE A DAY.  50 each  6  . [DISCONTINUED] Ascorbic Acid (VITAMIN C) 500 MG tablet Take 500 mg by mouth daily.          No Known  Allergies  Past Medical History  Diagnosis Date  . Hypertension   . CAD of autologous bypass graft   . Carotid artery disease   . PVD (peripheral vascular disease) with claudication   . Hypercholesteremia   . Renal insufficiency   . DJD (degenerative joint disease)   . History of anemia of chronic disease    ROS: Negative except as per HPI  BP 150/64  Pulse 57  Ht 5\' 9"  (1.753 m)  Wt 73.483 kg (162 lb)  BMI 23.92 kg/m2  SpO2 99%  PHYSICAL EXAM: Pt is alert and oriented, pleasant elderly male in no acute distress  HEENT: normal Neck: JVP - normal, carotids 2+= without bruits Lungs: CTA bilaterally CV: RRR without murmur or gallop Abd: soft, NT, Positive BS, no hepatomegaly Ext: no C/C/E Skin: warm/dry no rash  EKG:  Sinus bradycardia 57 beats per minute, first degree AV block, otherwise within normal limits.  ASSESSMENT AND PLAN: 1. CAD status post CABG. The patient remained stable without anginal symptoms. His medications are appropriate. He is now 13 years out from his heart surgery. Continue clinical followup.  2.  Peripheral arterial disease with intermittent claudication. His symptoms are stable. He remains on antiplatelet therapy with aspirin, lipid-lowering with Crestor, and he is on an ARB. I asked him to increase losartan 50 mg daily. He will have a followup metabolic panel in 2 weeks.  3. Hyperlipidemia. The patient is on Crestor 10 mg and will have followup lipids and LFTs in 2 weeks.  4. Chronic kidney disease, stage III. Losartan increased with close followup lab work in 2 weeks.  For followup of like to see him back in 12 months.  Sherren Mocha 02/15/2012 6:16 AM

## 2012-02-15 ENCOUNTER — Encounter: Payer: Self-pay | Admitting: Cardiovascular Disease

## 2012-02-25 DIAGNOSIS — H3530 Unspecified macular degeneration: Secondary | ICD-10-CM | POA: Diagnosis not present

## 2012-02-25 DIAGNOSIS — H43399 Other vitreous opacities, unspecified eye: Secondary | ICD-10-CM | POA: Diagnosis not present

## 2012-02-25 DIAGNOSIS — E11329 Type 2 diabetes mellitus with mild nonproliferative diabetic retinopathy without macular edema: Secondary | ICD-10-CM | POA: Diagnosis not present

## 2012-02-25 DIAGNOSIS — E109 Type 1 diabetes mellitus without complications: Secondary | ICD-10-CM | POA: Diagnosis not present

## 2012-02-28 ENCOUNTER — Other Ambulatory Visit: Payer: Medicare Other

## 2012-03-03 ENCOUNTER — Other Ambulatory Visit (INDEPENDENT_AMBULATORY_CARE_PROVIDER_SITE_OTHER): Payer: Medicare Other

## 2012-03-03 DIAGNOSIS — I25810 Atherosclerosis of coronary artery bypass graft(s) without angina pectoris: Secondary | ICD-10-CM | POA: Diagnosis not present

## 2012-03-03 DIAGNOSIS — E7800 Pure hypercholesterolemia, unspecified: Secondary | ICD-10-CM | POA: Diagnosis not present

## 2012-03-03 DIAGNOSIS — I70219 Atherosclerosis of native arteries of extremities with intermittent claudication, unspecified extremity: Secondary | ICD-10-CM

## 2012-03-03 DIAGNOSIS — I10 Essential (primary) hypertension: Secondary | ICD-10-CM | POA: Diagnosis not present

## 2012-03-03 LAB — HEPATIC FUNCTION PANEL
Albumin: 3.6 g/dL (ref 3.5–5.2)
Total Bilirubin: 0.9 mg/dL (ref 0.3–1.2)

## 2012-03-03 LAB — BASIC METABOLIC PANEL
CO2: 27 mEq/L (ref 19–32)
Calcium: 9 mg/dL (ref 8.4–10.5)
Glucose, Bld: 114 mg/dL — ABNORMAL HIGH (ref 70–99)
Sodium: 138 mEq/L (ref 135–145)

## 2012-03-03 LAB — LIPID PANEL
HDL: 39.1 mg/dL (ref >39.00)
LDL Cholesterol: 91 mg/dL (ref 0–99)
Total CHOL/HDL Ratio: 4
Triglycerides: 118 mg/dL (ref 0.0–149.0)

## 2012-03-10 ENCOUNTER — Telehealth: Payer: Self-pay | Admitting: Pulmonary Disease

## 2012-03-10 MED ORDER — ACCU-CHEK MULTICLIX LANCETS MISC: Status: DC

## 2012-03-10 MED ORDER — GLUCOSE BLOOD VI STRP: ORAL_STRIP | Status: DC

## 2012-03-10 NOTE — Telephone Encounter (Signed)
Faxed over rx for accu- check  Multi click lancets and aviva plus test strips prn

## 2012-03-17 DIAGNOSIS — E109 Type 1 diabetes mellitus without complications: Secondary | ICD-10-CM | POA: Diagnosis not present

## 2012-03-17 DIAGNOSIS — H26499 Other secondary cataract, unspecified eye: Secondary | ICD-10-CM | POA: Diagnosis not present

## 2012-03-17 DIAGNOSIS — H353190 Nonexudative age-related macular degeneration, unspecified eye, stage unspecified: Secondary | ICD-10-CM | POA: Diagnosis not present

## 2012-04-03 DIAGNOSIS — H353190 Nonexudative age-related macular degeneration, unspecified eye, stage unspecified: Secondary | ICD-10-CM | POA: Diagnosis not present

## 2012-04-03 DIAGNOSIS — H26499 Other secondary cataract, unspecified eye: Secondary | ICD-10-CM | POA: Diagnosis not present

## 2012-04-10 ENCOUNTER — Inpatient Hospital Stay (HOSPITAL_COMMUNITY)
Admission: EM | Admit: 2012-04-10 | Discharge: 2012-04-13 | DRG: 392 | Disposition: A | Discharge status: Home / Self Care | Payer: Medicare Other | Attending: Internal Medicine | Admitting: Internal Medicine

## 2012-04-10 ENCOUNTER — Encounter (HOSPITAL_COMMUNITY): Payer: Self-pay | Admitting: Emergency Medicine

## 2012-04-10 ENCOUNTER — Emergency Department (HOSPITAL_COMMUNITY): Payer: Medicare Other

## 2012-04-10 DIAGNOSIS — E871 Hypo-osmolality and hyponatremia: Secondary | ICD-10-CM | POA: Diagnosis present

## 2012-04-10 DIAGNOSIS — R112 Nausea with vomiting, unspecified: Secondary | ICD-10-CM | POA: Diagnosis present

## 2012-04-10 DIAGNOSIS — Z87891 Personal history of nicotine dependence: Secondary | ICD-10-CM | POA: Diagnosis not present

## 2012-04-10 DIAGNOSIS — E119 Type 2 diabetes mellitus without complications: Secondary | ICD-10-CM | POA: Diagnosis present

## 2012-04-10 DIAGNOSIS — Z951 Presence of aortocoronary bypass graft: Secondary | ICD-10-CM

## 2012-04-10 DIAGNOSIS — E785 Hyperlipidemia, unspecified: Secondary | ICD-10-CM | POA: Diagnosis present

## 2012-04-10 DIAGNOSIS — D638 Anemia in other chronic diseases classified elsewhere: Secondary | ICD-10-CM | POA: Diagnosis present

## 2012-04-10 DIAGNOSIS — A088 Other specified intestinal infections: Secondary | ICD-10-CM | POA: Diagnosis not present

## 2012-04-10 DIAGNOSIS — I70219 Atherosclerosis of native arteries of extremities with intermittent claudication, unspecified extremity: Secondary | ICD-10-CM | POA: Diagnosis present

## 2012-04-10 DIAGNOSIS — I25810 Atherosclerosis of coronary artery bypass graft(s) without angina pectoris: Secondary | ICD-10-CM | POA: Diagnosis not present

## 2012-04-10 DIAGNOSIS — D631 Anemia in chronic kidney disease: Secondary | ICD-10-CM | POA: Diagnosis present

## 2012-04-10 DIAGNOSIS — I6529 Occlusion and stenosis of unspecified carotid artery: Secondary | ICD-10-CM | POA: Diagnosis present

## 2012-04-10 DIAGNOSIS — M1990 Unspecified osteoarthritis, unspecified site: Secondary | ICD-10-CM | POA: Diagnosis not present

## 2012-04-10 DIAGNOSIS — I10 Essential (primary) hypertension: Secondary | ICD-10-CM | POA: Diagnosis present

## 2012-04-10 DIAGNOSIS — I129 Hypertensive chronic kidney disease with stage 1 through stage 4 chronic kidney disease, or unspecified chronic kidney disease: Secondary | ICD-10-CM | POA: Diagnosis present

## 2012-04-10 DIAGNOSIS — N039 Chronic nephritic syndrome with unspecified morphologic changes: Secondary | ICD-10-CM | POA: Diagnosis present

## 2012-04-10 DIAGNOSIS — R5381 Other malaise: Secondary | ICD-10-CM | POA: Diagnosis not present

## 2012-04-10 DIAGNOSIS — I2510 Atherosclerotic heart disease of native coronary artery without angina pectoris: Secondary | ICD-10-CM | POA: Diagnosis present

## 2012-04-10 DIAGNOSIS — R110 Nausea: Secondary | ICD-10-CM | POA: Diagnosis not present

## 2012-04-10 DIAGNOSIS — R42 Dizziness and giddiness: Secondary | ICD-10-CM | POA: Diagnosis not present

## 2012-04-10 DIAGNOSIS — N183 Chronic kidney disease, stage 3 unspecified: Secondary | ICD-10-CM | POA: Diagnosis present

## 2012-04-10 DIAGNOSIS — D649 Anemia, unspecified: Secondary | ICD-10-CM | POA: Diagnosis not present

## 2012-04-10 DIAGNOSIS — E7800 Pure hypercholesterolemia, unspecified: Secondary | ICD-10-CM | POA: Diagnosis not present

## 2012-04-10 LAB — URINALYSIS, ROUTINE W REFLEX MICROSCOPIC
Glucose, UA: 250 mg/dL — AB
Hgb urine dipstick: NEGATIVE
Specific Gravity, Urine: 1.016 (ref 1.005–1.030)
Urobilinogen, UA: 0.2 mg/dL (ref 0.0–1.0)
pH: 7 (ref 5.0–8.0)

## 2012-04-10 LAB — BASIC METABOLIC PANEL
CO2: 23 mEq/L (ref 19–32)
Chloride: 97 mEq/L (ref 96–112)
GFR calc non Af Amer: 39 mL/min — ABNORMAL LOW (ref >90)
Glucose, Bld: 237 mg/dL — ABNORMAL HIGH (ref 70–99)
Potassium: 3.7 mEq/L (ref 3.5–5.1)
Sodium: 133 mEq/L — ABNORMAL LOW (ref 135–145)

## 2012-04-10 LAB — CBC WITH DIFFERENTIAL/PLATELET
Hemoglobin: 10.4 g/dL — ABNORMAL LOW (ref 13.0–17.0)
Lymphocytes Relative: 8 % — ABNORMAL LOW (ref 12–46)
Lymphs Abs: 0.9 10*3/uL (ref 0.7–4.0)
Monocytes Relative: 5 % (ref 3–12)
Neutro Abs: 9.8 10*3/uL — ABNORMAL HIGH (ref 1.7–7.7)
Neutrophils Relative %: 87 % — ABNORMAL HIGH (ref 43–77)
Platelets: 167 10*3/uL (ref 150–400)
RBC: 3.49 MIL/uL — ABNORMAL LOW (ref 4.22–5.81)
WBC: 11.3 10*3/uL — ABNORMAL HIGH (ref 4.0–10.5)

## 2012-04-10 MED ORDER — ASPIRIN EC 325 MG PO TBEC
325.0000 mg | DELAYED_RELEASE_TABLET | Freq: Every day | ORAL | Status: DC
  Administered 2012-04-11 – 2012-04-13 (×3): 325 mg via ORAL
  Filled 2012-04-10 (×3): qty 1

## 2012-04-10 MED ORDER — ONDANSETRON HCL 4 MG/2ML IJ SOLN: 4.0000 mg | Freq: Four times a day (QID) | INTRAMUSCULAR | Status: DC | PRN

## 2012-04-10 MED ORDER — ONDANSETRON HCL 4 MG/2ML IJ SOLN
4.0000 mg | Freq: Once | INTRAMUSCULAR | Status: AC
  Administered 2012-04-10: 4 mg via INTRAVENOUS
  Filled 2012-04-10: qty 2

## 2012-04-10 MED ORDER — ATORVASTATIN CALCIUM 20 MG PO TABS
20.0000 mg | ORAL_TABLET | Freq: Every day | ORAL | Status: DC
  Administered 2012-04-11 – 2012-04-12 (×2): 20 mg via ORAL
  Filled 2012-04-10 (×3): qty 1

## 2012-04-10 MED ORDER — ACETAMINOPHEN 650 MG RE SUPP: 650.0000 mg | Freq: Four times a day (QID) | RECTAL | Status: DC | PRN

## 2012-04-10 MED ORDER — AMLODIPINE BESYLATE 10 MG PO TABS
10.0000 mg | ORAL_TABLET | Freq: Every day | ORAL | Status: DC
  Administered 2012-04-11 – 2012-04-13 (×3): 10 mg via ORAL
  Filled 2012-04-10 (×3): qty 1

## 2012-04-10 MED ORDER — VITAMIN C 500 MG PO TABS
500.0000 mg | ORAL_TABLET | Freq: Every day | ORAL | Status: DC
  Administered 2012-04-11 – 2012-04-13 (×3): 500 mg via ORAL
  Filled 2012-04-10 (×3): qty 1

## 2012-04-10 MED ORDER — MECLIZINE HCL 25 MG PO TABS
25.0000 mg | ORAL_TABLET | Freq: Once | ORAL | Status: AC
  Administered 2012-04-10: 25 mg via ORAL
  Filled 2012-04-10: qty 1

## 2012-04-10 MED ORDER — ONDANSETRON HCL 4 MG/2ML IJ SOLN: 4.0000 mg | Freq: Three times a day (TID) | INTRAMUSCULAR | Status: AC | PRN

## 2012-04-10 MED ORDER — FERROUS GLUCONATE 324 (38 FE) MG PO TABS
325.0000 mg | ORAL_TABLET | Freq: Every day | ORAL | Status: DC
  Administered 2012-04-11 – 2012-04-13 (×3): 325 mg via ORAL
  Filled 2012-04-10 (×4): qty 1

## 2012-04-10 MED ORDER — LOSARTAN POTASSIUM 50 MG PO TABS
50.0000 mg | ORAL_TABLET | Freq: Every day | ORAL | Status: DC
  Administered 2012-04-11: 50 mg via ORAL
  Filled 2012-04-10 (×2): qty 1

## 2012-04-10 MED ORDER — METOPROLOL TARTRATE 25 MG PO TABS
25.0000 mg | ORAL_TABLET | Freq: Two times a day (BID) | ORAL | Status: DC
  Administered 2012-04-10 – 2012-04-13 (×6): 25 mg via ORAL
  Filled 2012-04-10 (×7): qty 1

## 2012-04-10 MED ORDER — SODIUM CHLORIDE 0.9 % IV SOLN
INTRAVENOUS | Status: DC
  Administered 2012-04-10 – 2012-04-12 (×4): via INTRAVENOUS

## 2012-04-10 MED ORDER — ACETAMINOPHEN 325 MG PO TABS: 650.0000 mg | ORAL_TABLET | Freq: Four times a day (QID) | ORAL | Status: DC | PRN

## 2012-04-10 MED ORDER — ONDANSETRON HCL 4 MG PO TABS: 4.0000 mg | ORAL_TABLET | Freq: Four times a day (QID) | ORAL | Status: DC | PRN

## 2012-04-10 MED ORDER — SODIUM CHLORIDE 0.9 % IJ SOLN
3.0000 mL | Freq: Two times a day (BID) | INTRAMUSCULAR | Status: DC
  Administered 2012-04-10 – 2012-04-12 (×2): 3 mL via INTRAVENOUS

## 2012-04-10 MED ORDER — SODIUM CHLORIDE 0.9 % IV SOLN: INTRAVENOUS | Status: AC

## 2012-04-10 NOTE — ED Notes (Signed)
This am pt became dizzy and nausea after eating breakfast this am. Alert x4, pale, cbg 203. Denies any pain

## 2012-04-10 NOTE — ED Notes (Signed)
Pt transferred to 4th floor.

## 2012-04-10 NOTE — ED Notes (Signed)
Report given to 4th floor. Awaiting for bed to be cleaned.

## 2012-04-10 NOTE — ED Provider Notes (Signed)
History     CSN: [id number]  Arrival date & time 04/10/12  1600   First MD Initiated Contact with Patient 04/10/12 1610      Chief Complaint  Patient presents with  . Dizziness  . Nausea    (Consider location/radiation/quality/duration/timing/severity/associated sxs/prior treatment) HPI Comments: 79 y/o male with DM, Htn, CAD s/p CABG who presents with acute onset of dizzyness and vomiting which occurred this AM.  He is unable to walk b/c it makes it worse.  Sx are persistent, worse with movement but doesn't go away and not assocaited with focal weakness, cough, sob, CP, blurred vision, speech changes or rashes / fevers.  He complains of feeling severely weak all over but no focal weakness.  He is unable to to describe the dizzyness but when asked if he has a vertiginous feeling, he says, maybe a little.  Denies tinnitus, change in hearing.  Was normal when he awoke this AM.  vomitted X 4 prior to arrival.  No diarrhea.  No abd pain.  The history is provided by the patient and a relative.    Past Medical History  Diagnosis Date  . Hypertension   . CAD of autologous bypass graft   . Carotid artery disease   . PVD (peripheral vascular disease) with claudication   . Hypercholesteremia   . Renal insufficiency   . DJD (degenerative joint disease)   . History of anemia of chronic disease     Past Surgical History  Procedure Laterality Date  . Cataract surgery    . Coronary artery bypass graft  2000    by Dr. Cyndia Bent    No family history on file.  History  Substance Use Topics  . Smoking status: Former Smoker -- 1.00 packs/day for 10 years    Types: Cigarettes    Quit date: 02/26/1953  . Smokeless tobacco: Never Used  . Alcohol Use: No      Review of Systems  All other systems reviewed and are negative.    Allergies  Review of patient's allergies indicates no known allergies.  Home Medications   Current Outpatient Rx  Name  Route  Sig  Dispense  Refill  .  amLODipine (NORVASC) 10 MG tablet   Oral   Take 10 mg by mouth daily.           . Ascorbic Acid (VITAMIN C PO)      Take one tablet of CENTRUM SILVER VITAMIN C in the morning         . aspirin 325 MG EC tablet   Oral   Take 325 mg by mouth daily.           Marland Kitchen atorvastatin (LIPITOR) 20 MG tablet   Oral   Take 20 mg by mouth daily.         . Calcium Carbonate-Vitamin D (CALCIUM 600/VITAMIN D PO)      Take 1 calcium 600 mg vitamin D in the morning and one in the evening as directed         . ferrous gluconate (FERGON) 325 MG tablet   Oral   Take 325 mg by mouth daily with breakfast.          . furosemide (LASIX) 20 MG tablet   Oral   Take 20 mg by mouth 2 (two) times daily.         Marland Kitchen glucose blood (ACCU-CHEK AVIVA PLUS) test strip      Use as instructed   50 each  prn   . Lancets (ACCU-CHEK MULTICLIX) lancets      Use as instructed   100 each   prn   . losartan (COZAAR) 50 MG tablet   Oral   Take 1 tablet (50 mg total) by mouth daily.   90 tablet   3   . metoprolol tartrate (LOPRESSOR) 25 MG tablet   Oral   Take 25 mg by mouth 2 (two) times daily.           . Multiple Vitamins-Minerals (CENTRUM SILVER PO)   Oral   Take 1 tablet by mouth daily.           . rosuvastatin (CRESTOR) 10 MG tablet   Oral   Take 10 mg by mouth daily.             BP 147/54  Temp(Src) 97.3 F (36.3 C) (Oral)  SpO2 100%  Physical Exam  Nursing note and vitals reviewed. Constitutional: He appears well-developed and well-nourished.  Uncomfortable appearing  HENT:  Head: Normocephalic and atraumatic.  Mouth/Throat: Oropharynx is clear and moist. No oropharyngeal exudate.  Eyes: Conjunctivae and EOM are normal. Pupils are equal, round, and reactive to light. Right eye exhibits no discharge. Left eye exhibits no discharge. No scleral icterus.  No nystagmus, R pupil asymetric and abnormal shaped (hx of trauma years ago)  Neck: Normal range of motion. Neck  supple. No JVD present. No thyromegaly present.  Cardiovascular: Normal rate, regular rhythm and intact distal pulses.  Exam reveals no gallop and no friction rub.   Murmur ( soft systolic murmur) heard. Pulmonary/Chest: Effort normal and breath sounds normal. No respiratory distress. He has no wheezes. He has no rales.  Abdominal: Soft. Bowel sounds are normal. He exhibits no distension and no mass. There is no tenderness.  Musculoskeletal: Normal range of motion. He exhibits edema ( R ankle edema below the venous graft site (CABG)). He exhibits no tenderness.  Lymphadenopathy:    He has no cervical adenopathy.  Neurological: He is alert. Coordination normal.  Moves all extermities equally, has clear and oriented and goal directed speech, normal EOM, CN 3-12 intact and visual acuity is grossly normal.  Has no pronator drift, normal FNF. When sits up no truncal ataxia, no limb ataxia.  Laying down makes increased dizzy and nausea.  Skin: Skin is warm and dry. No rash noted. No erythema.  Psychiatric: He has a normal mood and affect. His behavior is normal.    ED Course  Procedures (including critical care time)  Labs Reviewed  GLUCOSE, CAPILLARY - Abnormal; Notable for the following:    Glucose-Capillary 203 (*)    All other components within normal limits  CBC WITH DIFFERENTIAL - Abnormal; Notable for the following:    WBC 11.3 (*)    RBC 3.49 (*)    Hemoglobin 10.4 (*)    HCT 30.2 (*)    Neutrophils Relative 87 (*)    Neutro Abs 9.8 (*)    Lymphocytes Relative 8 (*)    All other components within normal limits  BASIC METABOLIC PANEL - Abnormal; Notable for the following:    Sodium 133 (*)    Glucose, Bld 237 (*)    BUN 26 (*)    Creatinine, Ser 1.57 (*)    GFR calc non Af Amer 39 (*)    GFR calc Af Amer 45 (*)    All other components within normal limits  TROPONIN I  URINALYSIS, ROUTINE W REFLEX MICROSCOPIC   Ct Head  Wo Contrast  04/10/2012  *RADIOLOGY REPORT*  Clinical  Data: Dizziness and nausea.  CT HEAD WITHOUT CONTRAST  Technique:  Contiguous axial images were obtained from the base of the skull through the vertex without contrast.  Comparison: None.  Findings: No intracranial hemorrhage.  Global atrophy without hydrocephalus.  Small vessel disease type changes without CT evidence of large acute infarct.  Vascular calcifications.  No intracranial mass lesion detected on this unenhanced exam.  Mastoid air cells, middle ear cavities and visualized sinuses are clear.  Orbital structures unremarkable.  IMPRESSION: No intracranial hemorrhage.  Small vessel disease type changes without CT evidence of large acute infarct.  Vascular calcifications.  Global atrophy.   Original Report Authenticated By: Genia Del, M.D.      1. Dizziness   2. Hyponatremia   3. Anemia       MDM  Non specific dizzyness, possible vertigo, though is constant, has no other focal neuro defectis and normal VS without hypertension or fever.  Check labs, ECG, trop and also CT head, possible MRI,  zofran and meclizine and reeval.  The labs do not show a definite source of pt's symptoms but notably there is mild hyponatremia, leukocytosis, CRI, mild anemia and CT of the head without acute abnormalities.  MRI tech declines to come to hospital to obtain brain MRI to r/o posterior fossa ischemia, pt to be admitted to hospitalist.  i have reevlauted pt and he has ongoing dizziness.   Trop negative  ED ECG REPORT  I personally interpreted this EKG   Date: 04/10/2012   Rate: 67  Rhythm: normal sinus rhythm  QRS Axis: normal  Intervals: PR prolonged  ST/T Wave abnormalities: nonspecific ST changes  Conduction Disutrbances:first-degree A-V block   Narrative Interpretation: LVH present  Old EKG Reviewed: LVH is new since 2000       Johnna Acosta, MD 04/10/12 [phone]

## 2012-04-10 NOTE — ED Notes (Signed)
Patient is resting comfortably. Called 4th floor to see if room clean. Housekeeping cleaning room now.

## 2012-04-10 NOTE — H&P (Addendum)
[facility] History and Physical  Henry [name] [id number] DOB: 1924-11-24 DOA: 04/10/2012  Referring physician: ER physician PCP: Noralee Space, MD   Chief Complaint: nausea and vomiting, dizziness  HPI:  79 year old male with past medical history of Hypertension, Dyslipidemia, PVD and CAD status post CABG who presented to ED with complaints of feeling dizzy and with associated nausea and 4 episode of non bloody vomiting which started on the day of admission. Patient reports no associated abdominal pain or loss of consciousness. No reports of chest pain, shortness of breath or palpitations. No reports of diarrhea, no blood in stool or urine. No fever or chills or cough. No headaches, no loss of sensation, no blurry vision. In ED, CT head was negative for acute intracranial findings. MRI is still pending to rule put possible cerebellar stroke. BMET revealed mild hyponatremia of 133, creatinine elevated at 1.57 which is better than patient's baseline of 1.8 in 01/2012. CBC revealed mild leukocytosis of 11.3 and stable hemoglobin of 10.4  Assessment and Plan:  Principal Problem:   Nausea and vomiting, dizziness  Unclear etiology, possible viral gastroenteritis and dehydration versus stroke  CT head negative for acute intracranial findings  Lipase level within normal limits  Continue supportive care with IV fluids and antiemetics  In ED, patient was given meclizine for possible vertigo, some improvement   Awaiting MRI brain to rule out stroke  Needs PT evaluation and PT vestibular evaluation  Active Problems:   Leukocytosis  Likely reactive or due to possible viral gastroenteritis  Mild at 11.3; will continue to monitor   CKD (chronic kidney disease), stage III  Creatinine in 01/2012 as high as 1.8 so on this admission (1.57) better than baseline  Continue to monitor   HYPERCHOLESTEROLEMIA  Continue atorvastatin 20 mg Q HS   ANEMIA OF CHRONIC DISEASE    Secondary to chronic kidney disease  Hemoglobin stable at 10.4  Continue ferrous sulfate 325 mg daily   HYPERTENSION  Continue losartan 50 mg daily, metoprolol 25 mg BID and norvasc 10 mg daily  May continue lasix (kidney function better than in 01/2012)   H/O CAD, AUTOLOGOUS BYPASS GRAFT AND PVD WITH CLAUDICATION  Continue aspirin   Code Status: Full Family Communication: Pt at bedside Disposition Plan: Admit for further evaluation  Leisa Lenz, MD  [facility] Pager [phone]  If 7PM-7AM, please contact night-coverage [email or web address] Password [facility] 04/10/2012, 7:21 PM   Review of Systems:  Constitutional: Negative for fever, chills and malaise/fatigue. Negative for diaphoresis.  HENT: Negative for hearing loss, ear pain, nosebleeds, congestion, sore throat, neck pain, tinnitus and ear discharge.   Eyes: Negative for blurred vision, double vision, photophobia, pain, discharge and redness.  Respiratory: Negative for cough, hemoptysis, sputum production, shortness of breath, wheezing and stridor.   Cardiovascular: Negative for chest pain, palpitations, orthopnea, claudication and leg swelling.  Gastrointestinal: per HPI.  Genitourinary: Negative for dysuria, urgency, frequency, hematuria and flank pain.  Musculoskeletal: Negative for myalgias, back pain, joint pain and falls.  Skin: Negative for itching and rash.  Neurological: positive for dizziness and weakness. Negative for tingling, tremors, sensory change, speech change, focal weakness, loss of consciousness and headaches.  Endo/Heme/Allergies: Negative for environmental allergies and polydipsia. Does not bruise/bleed easily.  Psychiatric/Behavioral: Negative for suicidal ideas. The patient is not nervous/anxious.      Past Medical History  Diagnosis Date  . Hypertension   . CAD of autologous bypass graft   . Carotid artery disease   . PVD (peripheral  vascular disease) with claudication   . Hypercholesteremia   . Renal  insufficiency   . DJD (degenerative joint disease)   . History of anemia of chronic disease    Past Surgical History  Procedure Laterality Date  . Cataract surgery    . Coronary artery bypass graft  2000    by Dr. Cyndia Bent   Social History:  reports that he quit smoking about 59 years ago. His smoking use included Cigarettes. He has a 10 pack-year smoking history. He has never used smokeless tobacco. He reports that he does not drink alcohol or use illicit drugs.  No Known Allergies  Family History: heart disease in parents  Prior to Admission medications   Medication Sig Start Date End Date Taking? Authorizing Provider  amLODipine (NORVASC) 10 MG tablet Take 10 mg by mouth daily.     Yes Historical Provider, MD  Ascorbic Acid (VITAMIN C PO) Take one tablet of CENTRUM SILVER VITAMIN C in the morning   Yes Historical Provider, MD  aspirin 325 MG EC tablet Take 325 mg by mouth daily.     Yes Historical Provider, MD  atorvastatin (LIPITOR) 20 MG tablet Take 20 mg by mouth daily.   Yes Historical Provider, MD  Calcium Carbonate-Vitamin D (CALCIUM 600/VITAMIN D PO) Take 1 calcium 600 mg vitamin D in the morning and one in the evening as directed   Yes Historical Provider, MD  ferrous gluconate (FERGON) 325 MG tablet Take 325 mg by mouth daily with breakfast.    Yes Historical Provider, MD  furosemide (LASIX) 20 MG tablet Take 20 mg by mouth 2 (two) times daily.   Yes Historical Provider, MD  glucose blood (ACCU-CHEK AVIVA PLUS) test strip Use as instructed 03/10/12  Yes Noralee Space, MD  Lancets (ACCU-CHEK MULTICLIX) lancets Use as instructed 03/10/12  Yes Noralee Space, MD  losartan (COZAAR) 50 MG tablet Take 1 tablet (50 mg total) by mouth daily. 02/14/12  Yes Sherren Mocha, MD  metoprolol tartrate (LOPRESSOR) 25 MG tablet Take 25 mg by mouth 2 (two) times daily.     Yes Historical Provider, MD  Multiple Vitamins-Minerals (CENTRUM SILVER PO) Take 1 tablet by mouth daily.     Yes Historical  Provider, MD  rosuvastatin (CRESTOR) 10 MG tablet Take 10 mg by mouth daily.     Yes Historical Provider, MD   Physical Exam: Filed Vitals:   04/10/12 1610  BP: 147/54  Temp: 97.3 F (36.3 C)  TempSrc: Oral  SpO2: 100%    Physical Exam  Constitutional: Appears well-developed and well-nourished. No distress.  HENT: Normocephalic. No tonsillar erythema or exudates. Eyes: Conjunctivae and EOM are normal. PERRLA, no scleral icterus.  Neck: Normal ROM. Neck supple. No JVD. No tracheal deviation. No thyromegaly.  CVS: RRR, S1/S2 +, SEM apprecaited Pulmonary: Effort and breath sounds normal, no stridor, rhonchi, wheezes, rales.  Abdominal: Soft. BS +,  no distension, tenderness, rebound or guarding.  Musculoskeletal: Normal range of motion. Lower extremity edema but no tenderness.  Lymphadenopathy: No lymphadenopathy noted, cervical, inguinal. Neuro: Alert. No focal neurologic deficits. Skin: Skin is warm and dry. No rash noted. Not diaphoretic. No erythema. No pallor.  Psychiatric: Normal mood and affect. Behavior, judgment, thought content normal.   Labs on Admission:  Basic Metabolic Panel:  Recent Labs Lab 04/10/12 1640  NA 133*  K 3.7  CL 97  CO2 23  GLUCOSE 237*  BUN 26*  CREATININE 1.57*  CALCIUM 8.9   CBC:  Recent Labs  Lab 04/10/12 1640  WBC 11.3*  NEUTROABS 9.8*  HGB 10.4*  HCT 30.2*  MCV 86.5  PLT 167   Cardiac Enzymes:  Recent Labs Lab 04/10/12 1640  TROPONINI <0.30   BNP: No components found with this basename: POCBNP,  CBG:  Recent Labs Lab 04/10/12 1603  GLUCAP 203*    Radiological Exams on Admission: Ct Head Wo Contrast 04/10/2012  *  IMPRESSION: No intracranial hemorrhage.  Small vessel disease type changes without CT evidence of large acute infarct.  Vascular calcifications.  Global atrophy.   Original Report Authenticated By: Genia Del, M.D.     Time spent: 75 minutes

## 2012-04-11 ENCOUNTER — Inpatient Hospital Stay (HOSPITAL_COMMUNITY): Payer: Medicare Other

## 2012-04-11 LAB — CBC WITH DIFFERENTIAL/PLATELET
Basophils Absolute: 0 10*3/uL (ref 0.0–0.1)
Basophils Relative: 0 % (ref 0–1)
Eosinophils Relative: 0 % (ref 0–5)
HCT: 32.1 % — ABNORMAL LOW (ref 39.0–52.0)
MCHC: 35.2 g/dL (ref 30.0–36.0)
MCV: 86.3 fL (ref 78.0–100.0)
Monocytes Absolute: 0.7 10*3/uL (ref 0.1–1.0)
Neutro Abs: 6 10*3/uL (ref 1.7–7.7)
Platelets: 185 10*3/uL (ref 150–400)
RDW: 13.2 % (ref 11.5–15.5)

## 2012-04-11 LAB — COMPREHENSIVE METABOLIC PANEL
AST: 19 U/L (ref 0–37)
CO2: 27 mEq/L (ref 19–32)
CO2: 28 mEq/L (ref 19–32)
Calcium: 9.2 mg/dL (ref 8.4–10.5)
Chloride: 98 mEq/L (ref 96–112)
Creatinine, Ser: 1.4 mg/dL — ABNORMAL HIGH (ref 0.50–1.35)
Creatinine, Ser: 1.42 mg/dL — ABNORMAL HIGH (ref 0.50–1.35)
GFR calc Af Amer: 50 mL/min — ABNORMAL LOW (ref >90)
GFR calc Af Amer: 51 mL/min — ABNORMAL LOW (ref >90)
GFR calc non Af Amer: 43 mL/min — ABNORMAL LOW (ref >90)
GFR calc non Af Amer: 44 mL/min — ABNORMAL LOW (ref >90)
Glucose, Bld: 154 mg/dL — ABNORMAL HIGH (ref 70–99)
Glucose, Bld: 173 mg/dL — ABNORMAL HIGH (ref 70–99)
Potassium: 4.1 mEq/L (ref 3.5–5.1)
Sodium: 134 mEq/L — ABNORMAL LOW (ref 135–145)
Total Bilirubin: 0.4 mg/dL (ref 0.3–1.2)

## 2012-04-11 LAB — PHOSPHORUS: Phosphorus: 3.1 mg/dL (ref 2.3–4.6)

## 2012-04-11 LAB — CBC
Hemoglobin: 10.2 g/dL — ABNORMAL LOW (ref 13.0–17.0)
MCHC: 34.2 g/dL (ref 30.0–36.0)
RBC: 3.43 MIL/uL — ABNORMAL LOW (ref 4.22–5.81)
WBC: 7.4 10*3/uL (ref 4.0–10.5)

## 2012-04-11 LAB — GLUCOSE, CAPILLARY: Glucose-Capillary: 128 mg/dL — ABNORMAL HIGH (ref 70–99)

## 2012-04-11 NOTE — Progress Notes (Signed)
Inpatient Diabetes Program Recommendations  AACE/ADA: New Consensus Statement on Inpatient Glycemic Control (2013)  Target Ranges:  Prepandial:   less than 140 mg/dL      Peak postprandial:   less than 180 mg/dL (1-2 hours)      Critically ill patients:  140 - 180 mg/dL   Per chart review, noted patient does have a history of diabetes (see note from Dr. Teressa Lower 08/02/2010).  Not taking any diabetes medications at home.  May want to increase CBG frequency to tid ac + HS while here in hospital (noted CBG checks only ordered QAM at present).   Note: Will follow. Wyn Quaker RN, MSN, CDE Diabetes Coordinator Inpatient Diabetes Program [phone]

## 2012-04-11 NOTE — Evaluation (Addendum)
Physical Therapy Evaluation Patient Details Name: Henry [name] MRN: [id number] DOB: 1924/05/18 Today's Date: 04/11/2012 Time: [id number] PT Time Calculation (min): 29 min  PT Assessment / Plan / Recommendation Clinical Impression  Pt admitted for nausea, vomiting, and dizziness.  Stroke work up negative.  Pt reporting 5/10 dizziness with mobility at this time.  Pt states dizziness worse with transitions from supine <-> sit and upon standing.  Pt reports slight "heaviness" in back of head with eye movement, saccades, and VOR testing however no nystagmus observed.  Pt also tested for BPPV and performed L and R Dix Hallpike and horizontal canal testing all negative for nystagmus but pt reports dizziness for less than 30 seconds with L and R dix-hallpike.  Pt would benefit from acute PT services in order to improve independence with transfers, ambulation and stairs to prepare for d/c home.  MD in to see pt upon finishing eval and reports plan to hydrate pt today. Will check back on pt tomorrow and if still dizzy then provide habituation exercises.    PT Assessment  Patient needs continued PT services    Follow Up Recommendations  Outpatient PT (vestibular outpatient PT)    Does the patient have the potential to tolerate intense rehabilitation      Barriers to Discharge        Equipment Recommendations  None recommended by PT    Recommendations for Other Services     Frequency Min 3X/week    Precautions / Restrictions Precautions Precautions: Fall   Pertinent Vitals/Pain BP taken after ambulation and stairs and entered by RN (check docflowsheets)      Mobility  Bed Mobility Bed Mobility: Supine to Sit;Sit to Supine Supine to Sit: 6: Modified independent (Device/Increase time) Sit to Supine: 6: Modified independent (Device/Increase time) Transfers Transfers: Sit to Stand;Stand to Sit Sit to Stand: 4: Min guard;With upper extremity assist;From bed Stand to Sit: 4: Min guard;With  upper extremity assist;To bed;To chair/3-in-1 Details for Transfer Assistance: min/guard for safety, pt dizzy upon sitting and again upon standing so educated to wait for dizzy to get better prior to transfer, pt swaying slightly upon standing however required no physical assist Ambulation/Gait Ambulation/Gait Assistance: 4: Min guard Ambulation Distance (Feet): 200 Feet Assistive device: None Ambulation/Gait Assistance Details: pt educated to focus on stationary objects to reduce dizziness with ambulation, unsteadiness observed but no LOB Gait Pattern: Step-through pattern;Wide base of support;Decreased stride length Gait velocity: decreased General Gait Details: 5/10 dizziness with ambulation and stairs Stairs: Yes Stairs Assistance: 4: Min guard Stairs Assistance Details (indicate cue type and reason): pt reports dizziness worse descending, alternating ascending and step to pattern descending Stair Management Technique: One rail Right;Alternating pattern;Step to pattern    Exercises     PT Diagnosis: Difficulty walking;Other (comment) (vertigo)  PT Problem List: Decreased activity tolerance;Decreased balance;Decreased mobility PT Treatment Interventions: DME instruction;Gait training;Functional mobility training;Therapeutic activities;Therapeutic exercise;Balance training;Patient/family education;Other (comment) (habituation exercises)   PT Goals Acute Rehab PT Goals PT Goal Formulation: With patient Time For Goal Achievement: 04/18/12 Potential to Achieve Goals: Good Pt will go Supine/Side to Sit: with modified independence PT Goal: Supine/Side to Sit - Progress: Goal set today Pt will go Sit to Stand: with modified independence PT Goal: Sit to Stand - Progress: Goal set today Pt will go Stand to Sit: with modified independence PT Goal: Stand to Sit - Progress: Goal set today Pt will Ambulate: 51 - 150 feet;with modified independence PT Goal: Ambulate - Progress: Goal set  today  Pt will Go Up / Down Stairs: Flight;with modified independence;with rail(s) PT Goal: Up/Down Stairs - Progress: Goal set today Pt will Perform Home Exercise Program: with supervision, verbal cues required/provided (includes habituation exercises if given) PT Goal: Perform Home Exercise Program - Progress: Goal set today Additional Goals Additional Goal #1: Pt will report decreased dizziness from 5/10 to 2/10 during ambulation and stairs. PT Goal: Additional Goal #1 - Progress: Goal set today  Visit Information  Last PT Received On: 04/11/12 Assistance Needed: +1    Subjective Data  Subjective: Yeah I'm still a little dizzy today.   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 Communication Communication: No difficulties    Cognition  Cognition Overall Cognitive Status: Appears within functional limits for tasks assessed/performed Arousal/Alertness: Awake/alert Orientation Level: Appears intact for tasks assessed Behavior During Session: [facility] for tasks performed    Extremity/Trunk Assessment Right Upper Extremity Assessment RUE ROM/Strength/Tone: [facility] for tasks assessed Left Upper Extremity Assessment LUE ROM/Strength/Tone: [facility] for tasks assessed Right Lower Extremity Assessment RLE ROM/Strength/Tone: [facility] for tasks assessed Left Lower Extremity Assessment LLE ROM/Strength/Tone: [facility] for tasks assessed   Balance    End of Session PT - End of Session Activity Tolerance: Patient tolerated treatment well Patient left: in chair;with call bell/phone within reach;with nursing in room  GP     Shonya Sumida,KATHrine E 04/11/2012, 1:08 PM  Carmelia Bake, PT, DPT 04/11/2012 Pager: [phone]

## 2012-04-11 NOTE — Care Management Note (Signed)
    Page 1 of 1   04/14/2012     12:42:11 PM   CARE MANAGEMENT NOTE 04/14/2012  Patient:  Henry [name], Henry [name]   Account Number:  [id number]  Date Initiated:  04/11/2012  Documentation initiated by:  Dessa Phi  Subjective/Objective Assessment:   ADMITTED W/N/V.HX:CAD.     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   Anticipated DC Date:  04/13/2012   Anticipated DC Plan:  HOME/SELF CARE      DC Planning Services  CM consult  OP Neuro Rehab      Choice offered to / List presented to:             Status of service:  Completed, signed off Medicare Important Message given?   (If response is "NO", the following Medicare IM given date fields will be blank) Date Medicare IM given:   Date Additional Medicare IM given:    Discharge Disposition:  HOME/SELF CARE  Per UR Regulation:  Reviewed for med. necessity/level of care/duration of stay  If discussed at [place] of Stay Meetings, dates discussed:    Comments:  04/12/12 Sharlene Mccluskey RN,BSN NCM [phone] PT-OTPT [place].PROVIDED PATIENT [place] NEURO REHAB DIRECTIONS. OTPT NEURO REHAB FORM ON CHART FOR MD TO COMPLETE.WILL NEED SCRIPT.  04/11/12 Saajan Willmon RN,BSN NCM [phone] AWAIT PT/OT RECOMMENDATIONS.

## 2012-04-11 NOTE — Progress Notes (Signed)
[facility] PROGRESS NOTE  Henry [name] [id number] DOB: 1924-06-11 DOA: 04/10/2012 PCP: Noralee Space, MD  Assessment/Plan: Principal problem Nausea and vomiting, dizziness  Possibly related to dehydration. Orthostasis negative. CT head and MRI brain negative as well. She does not have any nystagmus or symptoms of BPPV on exam. He did get dizzy on performing Dix-Hallpike maneuver by PT. Continue with IV hydration and monitor. Recommended for outpatient vestibular PT.  Active Problems:  Leukocytosis  Resolved. Possibly related to viral gastroenteritis  CKD (chronic kidney disease), stage III  Due to function improved with hydration  Anemia of chronic disease Hemoglobin at baseline Continue iron sulfate  CAD status post CABG Continue aspirin and statin  HYPERTENSION  Her pressure stable continue home antihypertensive      Code Status: Full code Family Communication: None at bedside Disposition Plan: Home with outpatient PT once stable   Consultants:  None  Procedures:  None  Antibiotics:  None  HPI/Subjective: Informs of feeling dizzy while trying to ambulate. Denies any headache , no further nausea or vomiting.  Objective: Filed Vitals:   04/10/12 2235 04/11/12 0529 04/11/12 1127 04/11/12 1544  BP: 174/56 157/53 169/61 144/48  Pulse: 60 62 68 58  Temp:  97.7 F (36.5 C)  97.8 F (36.6 C)  TempSrc:  Oral  Oral  Resp: 16 16  16   Height:      Weight:  73.6 kg (162 lb 4.1 oz)    SpO2: 99% 97%  97%    Intake/Output Summary (Last 24 hours) at 04/11/12 1653 Last data filed at 04/11/12 1545  Gross per 24 hour  Intake    480 ml  Output   1600 ml  Net  -1120 ml   Filed Weights   04/10/12 2233 04/11/12 0529  Weight: 72.4 kg (159 lb 9.8 oz) 73.6 kg (162 lb 4.1 oz)    Exam:   General: Elderly male in no acute distress  HEENT: No pallor, dry oral mucosa  Cardiovascular: Normal S1 and S2, no murmurs rub or gallop  Respiratory: Clear  to auscultation bilaterally no added  Abdomen: Soft, nontender, not distended, bowel sounds present  Extremities: Warm, no edema  CNS: AAO x3, no nystagmus, nonfocal exam orthostatic negative   Data Reviewed: Basic Metabolic Panel:  Recent Labs Lab 04/10/12 1640 04/10/12 2330 04/11/12 0440  NA 133* 134* 135  K 3.7 4.1 4.1  CL 97 98 100  CO2 23 27 28   GLUCOSE 237* 154* 173*  BUN 26* 22 20  CREATININE 1.57* 1.40* 1.42*  CALCIUM 8.9 9.2 9.2  MG  --  2.0  --   PHOS  --  3.1  --    Liver Function Tests:  Recent Labs Lab 04/10/12 2330 04/11/12 0440  AST 19 18  ALT 11 9  ALKPHOS 70 59  BILITOT 0.4 0.3  PROT 7.5 6.3  ALBUMIN 3.9 3.3*   No results found for this basename: LIPASE, AMYLASE,  in the last 168 hours No results found for this basename: AMMONIA,  in the last 168 hours CBC:  Recent Labs Lab 04/10/12 1640 04/10/12 2330 04/11/12 0440  WBC 11.3* 8.1 7.4  NEUTROABS 9.8* 6.0  --   HGB 10.4* 11.3* 10.2*  HCT 30.2* 32.1* 29.8*  MCV 86.5 86.3 86.9  PLT 167 185 157   Cardiac Enzymes:  Recent Labs Lab 04/10/12 1640  TROPONINI <0.30   BNP (last 3 results) No results found for this basename: PROBNP,  in the last 8760 hours CBG:  Recent Labs Lab 04/10/12 1603 04/11/12 0721  GLUCAP 203* 128*    No results found for this or any previous visit (from the past 240 hour(s)).   Studies: Ct Head Wo Contrast  04/10/2012  *RADIOLOGY REPORT*  Clinical Data: Dizziness and nausea.  CT HEAD WITHOUT CONTRAST  Technique:  Contiguous axial images were obtained from the base of the skull through the vertex without contrast.  Comparison: None.  Findings: No intracranial hemorrhage.  Global atrophy without hydrocephalus.  Small vessel disease type changes without CT evidence of large acute infarct.  Vascular calcifications.  No intracranial mass lesion detected on this unenhanced exam.  Mastoid air cells, middle ear cavities and visualized sinuses are clear.  Orbital  structures unremarkable.  IMPRESSION: No intracranial hemorrhage.  Small vessel disease type changes without CT evidence of large acute infarct.  Vascular calcifications.  Global atrophy.   Original Report Authenticated By: Genia Del, M.D.    Mr Brain Wo Contrast  04/11/2012  *RADIOLOGY REPORT*  Clinical Data: Dizziness and weakness.  Chronic renal disease. Hypertension.  Hypercholesterolemia.  Anemia.  MRI HEAD WITHOUT CONTRAST  Technique:  Multiplanar, multiecho pulse sequences of the brain and surrounding structures were obtained according to standard protocol without intravenous contrast.  Comparison: CT head 04/10/2012 (no acute findings).  Findings: There is no evidence for acute infarction, intracranial hemorrhage, mass lesion, hydrocephalus, or extra-axial fluid. There is moderately advanced atrophy and chronic microvascular ischemic change throughout the periventricular and subcortical white matter.   Dolichoectatic but widely patent intracranial vasculature.  No pituitary or cerebellar tonsil abnormality.  No osseous lesions.  Bilateral cataract extraction.  Clear sinuses and mastoids.  IMPRESSION: Moderately advanced atrophy and chronic microvascular ischemic change.  No visible acute infarction or hemorrhage.   Original Report Authenticated By: Rolla Flatten, M.D.     Scheduled Meds: . sodium chloride   Intravenous STAT  . amLODipine  10 mg Oral Daily  . aspirin  325 mg Oral Daily  . atorvastatin  20 mg Oral Daily  . ferrous gluconate  325 mg Oral Q breakfast  . losartan  50 mg Oral Daily  . metoprolol tartrate  25 mg Oral BID  . sodium chloride  3 mL Intravenous Q12H  . vitamin C  500 mg Oral Daily   Continuous Infusions: . sodium chloride 75 mL/hr at 04/11/12 1515      Time spent:25 minutes    Henry [name], Henry [name]  [facility] Pager [phone] If 8PM-8AM, please contact night-coverage at [email or web address], password [facility] 04/11/2012, 4:53 PM  LOS: 1 day

## 2012-04-12 DIAGNOSIS — D649 Anemia, unspecified: Secondary | ICD-10-CM

## 2012-04-12 LAB — GLUCOSE, CAPILLARY: Glucose-Capillary: 91 mg/dL (ref 70–99)

## 2012-04-12 MED ORDER — LOSARTAN POTASSIUM 50 MG PO TABS
50.0000 mg | ORAL_TABLET | Freq: Every day | ORAL | Status: DC
  Administered 2012-04-12 – 2012-04-13 (×2): 50 mg via ORAL
  Filled 2012-04-12 (×2): qty 1

## 2012-04-12 NOTE — Progress Notes (Signed)
Physical Therapy Treatment Patient Details Name: Henry [name] MRN: [id number] DOB: 03-06-1924 Today's Date: 04/12/2012 Time: 1011-1030 PT Time Calculation (min): 19 min  PT Assessment / Plan / Recommendation Comments on Treatment Session  Pt reports feeling better today and rates dizziness 2/10 with ambulation today.  Pt also reported slightly blurry vision today but stated he just went to eye doctor PTA and needs glasses (?contribution to dizzines).    Follow Up Recommendations  Outpatient PT;Other (comment) (outpatient vestibular PT)     Does the patient have the potential to tolerate intense rehabilitation     Barriers to Discharge        Equipment Recommendations  None recommended by PT    Recommendations for Other Services    Frequency     Plan Discharge plan remains appropriate;Frequency remains appropriate    Precautions / Restrictions Precautions Precautions: Fall   Pertinent Vitals/Pain No pain    Mobility  Bed Mobility Bed Mobility: Supine to Sit;Sit to Supine Supine to Sit: 6: Modified independent (Device/Increase time) Sit to Supine: 6: Modified independent (Device/Increase time) Transfers Transfers: Sit to Stand;Stand to Sit Sit to Stand: With upper extremity assist;From bed;5: Supervision Stand to Sit: With upper extremity assist;To chair/3-in-1;5: Supervision Details for Transfer Assistance: supervision for safety, pt dizzy upon standing and presented with initial sway but much better than yesterday Ambulation/Gait Ambulation/Gait Assistance: 4: Min guard Ambulation Distance (Feet): 400 Feet Assistive device: None Ambulation/Gait Assistance Details: mod verbal cues to focus on stationary objects Gait Pattern: Step-through pattern;Wide base of support;Decreased stride length Gait velocity: decreased General Gait Details: 2/10 dizziness with ambulation and stairs Stairs: Yes Stairs Assistance: 4: Min guard Stairs Assistance Details (indicate cue type  and reason): attempted alternating with descent but a little unsteady so educated step to for descent for safety Stair Management Technique: One rail Right;Alternating pattern;Step to pattern Number of Stairs: 10    Exercises     PT Diagnosis:    PT Problem List:   PT Treatment Interventions:     PT Goals Acute Rehab PT Goals PT Goal: Supine/Side to Sit - Progress: Met PT Goal: Sit to Stand - Progress: Progressing toward goal PT Goal: Stand to Sit - Progress: Progressing toward goal PT Goal: Ambulate - Progress: Progressing toward goal PT Goal: Up/Down Stairs - Progress: Progressing toward goal Additional Goals Additional Goal #1: Pt will report decreased dizziness from 2/10 to 0/10 during ambulation and stairs. PT Goal: Additional Goal #1 - Progress: Updated due to goal met  Visit Information  Last PT Received On: 04/12/12 Assistance Needed: +1    Subjective Data  Subjective: It's better today but I'm still not 100%   Cognition  Cognition Overall Cognitive Status: Appears within functional limits for tasks assessed/performed Arousal/Alertness: Awake/alert Orientation Level: Appears intact for tasks assessed Behavior During Session: [facility] for tasks performed    Balance     End of Session PT - End of Session Activity Tolerance: Patient tolerated treatment well Patient left: with call bell/phone within reach;in chair   GP     Amalie Koran,KATHrine E 04/12/2012, 11:49 AM Carmelia Bake, PT, DPT 04/12/2012 Pager: [phone]

## 2012-04-12 NOTE — Progress Notes (Signed)
[facility] PROGRESS NOTE  Henry [name] [id number] DOB: [birth year] DOA: 04/10/2012 PCP: Henry Space, MD  Assessment/Plan:   Nausea and vomiting, dizziness  Possibly related to dehydration. Orthostasis negative. CT head and MRI brain negative as well. She does not have any nystagmus or symptoms of BPPV on exam. He did get dizzy on performing Dix-Hallpike maneuver by PT. Marland Kitchen Recommended for outpatient vestibular rehabilitation. Patient much improved but still feels dizzy on ambulating. Continue one more day of IV hydration. I will hold his Lasix for now and resume at a lower dose on discharge.  Leukocytosis  Resolved. Possibly related to viral gastroenteritis   CKD (chronic kidney disease), stage III  Baseline the function appears to be between 1.7-1.9. Currently stable and improved with hydration  Anemia of chronic disease  Hemoglobin at baseline  Continue iron sulfate   CAD status post CABG  Continue aspirin and statin   HYPERTENSION   continue home antihypertensive . BP stable   Code Status: Full code  Family Communication: Henry [name] at bedside Disposition Plan: Home with outpatient PT likely tomorrow. Consultants:  None Procedures:  None Antibiotics:  None   HPI/Subjective: No overnight issues. Continues to improve but still has some dizziness on ambulation  Objective: Filed Vitals:   04/11/12 1544 04/11/12 2114 04/12/12 0521 04/12/12 1300  BP: 144/48 152/54 167/47 138/60  Pulse: 58 64 64 67  Temp: 97.8 F (36.6 C) 98.1 F (36.7 C) 98.2 F (36.8 C) 98.6 F (37 C)  TempSrc: Oral Oral Oral Oral  Resp: 16 16 16 17   Height:      Weight:   72.6 kg (160 lb 0.9 oz)   SpO2: 97% 97% 97% 98%    Intake/Output Summary (Last 24 hours) at 04/12/12 1604 Last data filed at 04/12/12 0900  Gross per 24 hour  Intake 1392.5 ml  Output   1375 ml  Net   17.5 ml   Filed Weights   04/10/12 2233 04/11/12 0529 04/12/12 0521  Weight: 72.4 kg (159 lb 9.8 oz) 73.6  kg (162 lb 4.1 oz) 72.6 kg (160 lb 0.9 oz)    Exam:  General: Elderly male in no acute distress  HEENT: No pallor, dry oral mucosa  Cardiovascular: Normal S1 and S2, no murmurs rub or gallop  Respiratory: Clear to auscultation bilaterally no added  Abdomen: Soft, nontender, not distended, bowel sounds present  Extremities: Warm, no edema  CNS: AAO x3, no nystagmus, nonfocal exam orthostatic negative    Data Reviewed: Basic Metabolic Panel:  Recent Labs Lab 04/10/12 1640 04/10/12 2330 04/11/12 0440  NA 133* 134* 135  K 3.7 4.1 4.1  CL 97 98 100  CO2 23 27 28   GLUCOSE 237* 154* 173*  BUN 26* 22 20  CREATININE 1.57* 1.40* 1.42*  CALCIUM 8.9 9.2 9.2  MG  --  2.0  --   PHOS  --  3.1  --    Liver Function Tests:  Recent Labs Lab 04/10/12 2330 04/11/12 0440  AST 19 18  ALT 11 9  ALKPHOS 70 59  BILITOT 0.4 0.3  PROT 7.5 6.3  ALBUMIN 3.9 3.3*   No results found for this basename: LIPASE, AMYLASE,  in the last 168 hours No results found for this basename: AMMONIA,  in the last 168 hours CBC:  Recent Labs Lab 04/10/12 1640 04/10/12 2330 04/11/12 0440  WBC 11.3* 8.1 7.4  NEUTROABS 9.8* 6.0  --   HGB 10.4* 11.3* 10.2*  HCT 30.2* 32.1* 29.8*  MCV  86.5 86.3 86.9  PLT 167 185 157   Cardiac Enzymes:  Recent Labs Lab 04/10/12 1640  TROPONINI <0.30   BNP (last 3 results) No results found for this basename: PROBNP,  in the last 8760 hours CBG:  Recent Labs Lab 04/10/12 1603 04/11/12 0721 04/12/12 0751  GLUCAP 203* 128* 91    No results found for this or any previous visit (from the past 240 hour(s)).   Studies: Ct Head Wo Contrast  04/10/2012  *RADIOLOGY REPORT*  Clinical Data: Dizziness and nausea.  CT HEAD WITHOUT CONTRAST  Technique:  Contiguous axial images were obtained from the base of the skull through the vertex without contrast.  Comparison: None.  Findings: No intracranial hemorrhage.  Global atrophy without hydrocephalus.  Small vessel  disease type changes without CT evidence of large acute infarct.  Vascular calcifications.  No intracranial mass lesion detected on this unenhanced exam.  Mastoid air cells, middle ear cavities and visualized sinuses are clear.  Orbital structures unremarkable.  IMPRESSION: No intracranial hemorrhage.  Small vessel disease type changes without CT evidence of large acute infarct.  Vascular calcifications.  Global atrophy.   Original Report Authenticated By: Genia Del, M.D.    Mr Brain Wo Contrast  04/11/2012  *RADIOLOGY REPORT*  Clinical Data: Dizziness and weakness.  Chronic renal disease. Hypertension.  Hypercholesterolemia.  Anemia.  MRI HEAD WITHOUT CONTRAST  Technique:  Multiplanar, multiecho pulse sequences of the brain and surrounding structures were obtained according to standard protocol without intravenous contrast.  Comparison: CT head 04/10/2012 (no acute findings).  Findings: There is no evidence for acute infarction, intracranial hemorrhage, mass lesion, hydrocephalus, or extra-axial fluid. There is moderately advanced atrophy and chronic microvascular ischemic change throughout the periventricular and subcortical white matter.   Dolichoectatic but widely patent intracranial vasculature.  No pituitary or cerebellar tonsil abnormality.  No osseous lesions.  Bilateral cataract extraction.  Clear sinuses and mastoids.  IMPRESSION: Moderately advanced atrophy and chronic microvascular ischemic change.  No visible acute infarction or hemorrhage.   Original Report Authenticated By: Rolla Flatten, M.D.     Scheduled Meds: . amLODipine  10 mg Oral Daily  . aspirin  325 mg Oral Daily  . atorvastatin  20 mg Oral Daily  . ferrous gluconate  325 mg Oral Q breakfast  . losartan  50 mg Oral Daily  . metoprolol tartrate  25 mg Oral BID  . sodium chloride  3 mL Intravenous Q12H  . vitamin C  500 mg Oral Daily   Continuous Infusions: . sodium chloride 75 mL/hr at 04/12/12 0420      Time spent: 25  minutes    Henry [name]  [facility] Pager [phone] If 8PM-8AM, please contact night-coverage at [email or web address], password [facility] 04/12/2012, 4:04 PM  LOS: 2 days

## 2012-04-13 DIAGNOSIS — R42 Dizziness and giddiness: Secondary | ICD-10-CM | POA: Diagnosis present

## 2012-04-13 DIAGNOSIS — R112 Nausea with vomiting, unspecified: Secondary | ICD-10-CM

## 2012-04-13 DIAGNOSIS — E119 Type 2 diabetes mellitus without complications: Secondary | ICD-10-CM | POA: Diagnosis present

## 2012-04-13 LAB — GLUCOSE, CAPILLARY: Glucose-Capillary: 112 mg/dL — ABNORMAL HIGH (ref 70–99)

## 2012-04-13 MED ORDER — FUROSEMIDE 20 MG PO TABS: 20.0000 mg | ORAL_TABLET | Freq: Every day | ORAL | Status: DC

## 2012-04-13 NOTE — Discharge Summary (Addendum)
Physician Discharge Summary  Henry [name] [id number] DOB: 1924/08/17 DOA: 04/10/2012  PCP: Henry Space, MD  Admit date: 04/10/2012 Discharge date: 04/13/2012  Time spent: 40 minutes  Recommendations for Outpatient Follow-up:  Home with outpatient vestibular PT  Discharge Diagnoses:  Principal Problem:   Dizziness  Active Problems:    Nausea and vomiting   Leukocytosis   CKD (chronic kidney disease), stage III   Hyponatremia Anemia of chronic disease Hypertension Diabetes mellitus type 2   Discharge Condition: Fair  Diet recommendation: Diabetic  Filed Weights   04/11/12 0529 04/12/12 0521 04/13/12 0540  Weight: 73.6 kg (162 lb 4.1 oz) 72.6 kg (160 lb 0.9 oz) 72.8 kg (160 lb 7.9 oz)    History of present illness:  Please refer to admission H&P for details but in brief 79 year old male with past medical history of Hypertension, Dyslipidemia, PVD and CAD status post CABG who presented to ED with complaints of feeling dizzy and with associated nausea and 4 episode of non bloody vomiting which started on the day of admission   Hospital Course:  Nausea and vomiting, dizziness  Dizziness Possibly related to dehydration in the setting of several episodes of nausea and vomiting likely due to viral gastroenteritis.. Orthostasis negative. CT head and MRI brain done to evaluate for  cerebellar stroke and negative as well. he does not have any nystagmus or symptoms of BPPV on exam. He did get dizzy on performing Dix-Hallpike maneuver by PT. Marland Kitchen Recommended for outpatient vestibular rehabilitation. Patient much improved today.  I will resume his Lasix at a lower dose of 20 mg daily. Continue the many home blood pressure medications  Leukocytosis  Resolved. Possibly related to viral gastroenteritis   CKD (chronic kidney disease), stage III  Baseline the function appears to be between 1.7-1.9. Currently stable and improved with hydration . 1.42 on discharge  Anemia of chronic  disease  Hemoglobin seems to be at baseline . No recent hemoglobin and the system since 2009. Continue iron sulfate   CAD status post CABG  Continue aspirin and statin   HYPERTENSION  Resume all home blood pressure medications. Lasix dose reduced  Diabetes mellitus  Patient informs being on metformin twice a day but does not remember the dose. He informs being compliant with the medicine. His fingersticks have been fairly stable while in the hospital. He receives his medication at the [facility] in [place]. Needs to follow up with his PCP upon discharge.  Patient seen by PT and recommended for outpatient vestibular rehabilitation. Prescriptions signed. Stable for discharge home with outpatient PCP followup.      Procedures:  None  Consultations:  None  Discharge Exam: Filed Vitals:   04/12/12 1300 04/12/12 2130 04/13/12 0455 04/13/12 0540  BP: 138/60 153/53 172/60   Pulse: 67 67 66   Temp: 98.6 F (37 C) 97.7 F (36.5 C) 98.6 F (37 C)   TempSrc: Oral Oral Oral   Resp: 17 18 18    Height:      Weight:    72.8 kg (160 lb 7.9 oz)  SpO2: 98% 98% 99%    General: Elderly male in no acute distress  HEENT: No pallor, dry oral mucosa  Cardiovascular: Normal S1 and S2, no murmurs rub or gallop  Respiratory: Clear to auscultation bilaterally no added  Abdomen: Soft, nontender, not distended, bowel sounds present  Extremities: Warm, no edema  CNS: AAO x3, no nystagmus, nonfocal exam orthostatic negative    Discharge Instructions  Medication List    TAKE these medications       accu-chek multiclix lancets  Use as instructed     amLODipine 10 MG tablet  Commonly known as:  NORVASC  Take 10 mg by mouth daily.     aspirin 325 MG EC tablet  Take 325 mg by mouth daily.     atorvastatin 20 MG tablet  Commonly known as:  LIPITOR  Take 20 mg by mouth daily.     CALCIUM 600/VITAMIN D PO  Take 1 calcium 600 mg vitamin D in the morning and one in the evening as directed      CENTRUM SILVER PO  Take 1 tablet by mouth daily.     ferrous gluconate 325 MG tablet  Commonly known as:  FERGON  Take 325 mg by mouth daily with breakfast.     furosemide 20 MG tablet  Commonly known as:  LASIX  Take 1 tablet (20 mg total) by mouth daily.     glucose blood test strip  Commonly known as:  ACCU-CHEK AVIVA PLUS  Use as instructed     losartan 50 MG tablet  Commonly known as:  COZAAR  Take 1 tablet (50 mg total) by mouth daily.     metoprolol tartrate 25 MG tablet  Commonly known as:  LOPRESSOR  Take 25 mg by mouth 2 (two) times daily.     rosuvastatin 10 MG tablet  Commonly known as:  CRESTOR  Take 10 mg by mouth daily.     VITAMIN C PO  Take one tablet of CENTRUM SILVER VITAMIN C in the morning       patient also on metformin twice a day but does not remember the dose.Marland Kitchen gets his prescription at the [facility] in [place]   Follow up with [name],Henry M, MD In 1 week.   Contact information:   [address] [place] [address] [phone]        The results of significant diagnostics from this hospitalization (including imaging, microbiology, ancillary and laboratory) are listed below for reference.    Significant Diagnostic Studies: Ct Head Wo Contrast  04/10/2012  *RADIOLOGY REPORT*  Clinical Data: Dizziness and nausea.  CT HEAD WITHOUT CONTRAST  Technique:  Contiguous axial images were obtained from the base of the skull through the vertex without contrast.  Comparison: None.  Findings: No intracranial hemorrhage.  Global atrophy without hydrocephalus.  Small vessel disease type changes without CT evidence of large acute infarct.  Vascular calcifications.  No intracranial mass lesion detected on this unenhanced exam.  Mastoid air cells, middle ear cavities and visualized sinuses are clear.  Orbital structures unremarkable.  IMPRESSION: No intracranial hemorrhage.  Small vessel disease type changes without CT evidence of large  acute infarct.  Vascular calcifications.  Global atrophy.   Original Report Authenticated By: Henry [name], M.D.    Mr Brain Wo Contrast  04/11/2012  *RADIOLOGY REPORT*  Clinical Data: Dizziness and weakness.  Chronic renal disease. Hypertension.  Hypercholesterolemia.  Anemia.  MRI HEAD WITHOUT CONTRAST  Technique:  Multiplanar, multiecho pulse sequences of the brain and surrounding structures were obtained according to standard protocol without intravenous contrast.  Comparison: CT head 04/10/2012 (no acute findings).  Findings: There is no evidence for acute infarction, intracranial hemorrhage, mass lesion, hydrocephalus, or extra-axial fluid. There is moderately advanced atrophy and chronic microvascular ischemic change throughout the periventricular and subcortical white matter.   Dolichoectatic but widely patent intracranial vasculature.  No pituitary or cerebellar tonsil abnormality.  No osseous lesions.  Bilateral cataract extraction.  Clear sinuses and mastoids.  IMPRESSION: Moderately advanced atrophy and chronic microvascular ischemic change.  No visible acute infarction or hemorrhage.   Original Report Authenticated By: Rolla Flatten, M.D.     Microbiology: No results found for this or any previous visit (from the past 240 hour(s)).   Labs: Basic Metabolic Panel:  Recent Labs Lab 04/10/12 1640 04/10/12 2330 04/11/12 0440  NA 133* 134* 135  K 3.7 4.1 4.1  CL 97 98 100  CO2 23 27 28   GLUCOSE 237* 154* 173*  BUN 26* 22 20  CREATININE 1.57* 1.40* 1.42*  CALCIUM 8.9 9.2 9.2  MG  --  2.0  --   PHOS  --  3.1  --    Liver Function Tests:  Recent Labs Lab 04/10/12 2330 04/11/12 0440  AST 19 18  ALT 11 9  ALKPHOS 70 59  BILITOT 0.4 0.3  PROT 7.5 6.3  ALBUMIN 3.9 3.3*   No results found for this basename: LIPASE, AMYLASE,  in the last 168 hours No results found for this basename: AMMONIA,  in the last 168 hours CBC:  Recent Labs Lab 04/10/12 1640 04/10/12 2330  04/11/12 0440  WBC 11.3* 8.1 7.4  NEUTROABS 9.8* 6.0  --   HGB 10.4* 11.3* 10.2*  HCT 30.2* 32.1* 29.8*  MCV 86.5 86.3 86.9  PLT 167 185 157   Cardiac Enzymes:  Recent Labs Lab 04/10/12 1640  TROPONINI <0.30   BNP: BNP (last 3 results) No results found for this basename: PROBNP,  in the last 8760 hours CBG:  Recent Labs Lab 04/10/12 1603 04/11/12 0721 04/12/12 0751 04/13/12 0801  GLUCAP 203* 128* 91 112*       Signed:  Layla Gramm  [facility] 04/13/2012, 10:05 AM

## 2012-06-30 DIAGNOSIS — M545 Low back pain: Secondary | ICD-10-CM | POA: Diagnosis not present

## 2012-07-29 DIAGNOSIS — M545 Low back pain: Secondary | ICD-10-CM | POA: Diagnosis not present

## 2012-07-31 ENCOUNTER — Ambulatory Visit: Payer: Medicare Other | Attending: Physical Medicine and Rehabilitation

## 2012-07-31 DIAGNOSIS — R5381 Other malaise: Secondary | ICD-10-CM | POA: Diagnosis not present

## 2012-07-31 DIAGNOSIS — IMO0001 Reserved for inherently not codable concepts without codable children: Secondary | ICD-10-CM | POA: Diagnosis not present

## 2012-07-31 DIAGNOSIS — M545 Low back pain, unspecified: Secondary | ICD-10-CM | POA: Insufficient documentation

## 2012-08-04 ENCOUNTER — Ambulatory Visit: Payer: Medicare Other

## 2012-08-04 DIAGNOSIS — IMO0001 Reserved for inherently not codable concepts without codable children: Secondary | ICD-10-CM | POA: Diagnosis not present

## 2012-08-04 DIAGNOSIS — M545 Low back pain: Secondary | ICD-10-CM | POA: Diagnosis not present

## 2012-08-04 DIAGNOSIS — R5381 Other malaise: Secondary | ICD-10-CM | POA: Diagnosis not present

## 2012-08-05 DIAGNOSIS — M545 Low back pain: Secondary | ICD-10-CM | POA: Diagnosis not present

## 2012-08-12 ENCOUNTER — Ambulatory Visit: Payer: Medicare Other | Admitting: Physical Therapy

## 2012-08-12 DIAGNOSIS — M545 Low back pain: Secondary | ICD-10-CM | POA: Diagnosis not present

## 2012-08-12 DIAGNOSIS — M48061 Spinal stenosis, lumbar region without neurogenic claudication: Secondary | ICD-10-CM | POA: Diagnosis not present

## 2012-08-12 DIAGNOSIS — R5381 Other malaise: Secondary | ICD-10-CM | POA: Diagnosis not present

## 2012-08-12 DIAGNOSIS — IMO0001 Reserved for inherently not codable concepts without codable children: Secondary | ICD-10-CM | POA: Diagnosis not present

## 2012-08-18 ENCOUNTER — Telehealth: Payer: Self-pay | Admitting: Pulmonary Disease

## 2012-08-18 DIAGNOSIS — M48061 Spinal stenosis, lumbar region without neurogenic claudication: Secondary | ICD-10-CM | POA: Diagnosis not present

## 2012-08-18 NOTE — Telephone Encounter (Signed)
Henry [name] out and spoke with pt and he stated that he needs to have back surgery.  He has been in severe pain x 3 months and needs a form completed by SN for surgical clearance.  Pt was last seen by SN 07/2010.  Seen by Cardiology on 01/2012.  SN please advise.  thanks

## 2012-08-18 NOTE — Telephone Encounter (Signed)
Per SN--  Ok to add pt on the scheduled at 9:30 on 6/24.  Pt is aware of appt with SN for surgical clearance. Pt is aware of appt date and time.

## 2012-08-19 ENCOUNTER — Ambulatory Visit (INDEPENDENT_AMBULATORY_CARE_PROVIDER_SITE_OTHER)
Admission: RE | Admit: 2012-08-19 | Discharge: 2012-08-19 | Disposition: A | Discharge status: Home / Self Care | Payer: Medicare Other | Source: Ambulatory Visit | Attending: Pulmonary Disease | Admitting: Pulmonary Disease

## 2012-08-19 ENCOUNTER — Ambulatory Visit (INDEPENDENT_AMBULATORY_CARE_PROVIDER_SITE_OTHER): Payer: Medicare Other | Admitting: Pulmonary Disease

## 2012-08-19 ENCOUNTER — Other Ambulatory Visit (INDEPENDENT_AMBULATORY_CARE_PROVIDER_SITE_OTHER): Payer: Medicare Other

## 2012-08-19 ENCOUNTER — Telehealth: Payer: Self-pay | Admitting: Cardiovascular Disease

## 2012-08-19 ENCOUNTER — Encounter: Payer: Self-pay | Admitting: Pulmonary Disease

## 2012-08-19 ENCOUNTER — Telehealth: Payer: Self-pay | Admitting: Pulmonary Disease

## 2012-08-19 VITALS — BP 162/64 | HR 56 | Temp 97.8°F | Ht 69.0 in | Wt 159.6 lb

## 2012-08-19 DIAGNOSIS — I10 Essential (primary) hypertension: Secondary | ICD-10-CM | POA: Diagnosis not present

## 2012-08-19 DIAGNOSIS — D638 Anemia in other chronic diseases classified elsewhere: Secondary | ICD-10-CM

## 2012-08-19 DIAGNOSIS — E119 Type 2 diabetes mellitus without complications: Secondary | ICD-10-CM

## 2012-08-19 DIAGNOSIS — G319 Degenerative disease of nervous system, unspecified: Secondary | ICD-10-CM

## 2012-08-19 DIAGNOSIS — E538 Deficiency of other specified B group vitamins: Secondary | ICD-10-CM

## 2012-08-19 DIAGNOSIS — M545 Low back pain, unspecified: Secondary | ICD-10-CM | POA: Insufficient documentation

## 2012-08-19 DIAGNOSIS — E7800 Pure hypercholesterolemia, unspecified: Secondary | ICD-10-CM | POA: Diagnosis not present

## 2012-08-19 DIAGNOSIS — R0989 Other specified symptoms and signs involving the circulatory and respiratory systems: Secondary | ICD-10-CM | POA: Diagnosis not present

## 2012-08-19 DIAGNOSIS — R0600 Dyspnea, unspecified: Secondary | ICD-10-CM

## 2012-08-19 DIAGNOSIS — I25810 Atherosclerosis of coronary artery bypass graft(s) without angina pectoris: Secondary | ICD-10-CM

## 2012-08-19 DIAGNOSIS — J438 Other emphysema: Secondary | ICD-10-CM | POA: Diagnosis not present

## 2012-08-19 DIAGNOSIS — I6521 Occlusion and stenosis of right carotid artery: Secondary | ICD-10-CM

## 2012-08-19 DIAGNOSIS — F411 Generalized anxiety disorder: Secondary | ICD-10-CM

## 2012-08-19 DIAGNOSIS — M1990 Unspecified osteoarthritis, unspecified site: Secondary | ICD-10-CM

## 2012-08-19 DIAGNOSIS — I6529 Occlusion and stenosis of unspecified carotid artery: Secondary | ICD-10-CM

## 2012-08-19 DIAGNOSIS — F419 Anxiety disorder, unspecified: Secondary | ICD-10-CM

## 2012-08-19 DIAGNOSIS — N183 Chronic kidney disease, stage 3 unspecified: Secondary | ICD-10-CM

## 2012-08-19 DIAGNOSIS — I7389 Other specified peripheral vascular diseases: Secondary | ICD-10-CM | POA: Diagnosis not present

## 2012-08-19 DIAGNOSIS — R0609 Other forms of dyspnea: Secondary | ICD-10-CM | POA: Diagnosis not present

## 2012-08-19 LAB — BASIC METABOLIC PANEL
BUN: 21 mg/dL (ref 6–23)
Creatinine, Ser: 1.7 mg/dL — ABNORMAL HIGH (ref 0.4–1.5)
GFR: 41.35 mL/min — ABNORMAL LOW (ref >60.00)
Potassium: 4.4 mEq/L (ref 3.5–5.1)

## 2012-08-19 LAB — CBC WITH DIFFERENTIAL/PLATELET
Basophils Relative: 0.8 % (ref 0.0–3.0)
Eosinophils Relative: 7.5 % — ABNORMAL HIGH (ref 0.0–5.0)
HCT: 33.2 % — ABNORMAL LOW (ref 39.0–52.0)
Lymphs Abs: 1.8 10*3/uL (ref 0.7–4.0)
MCHC: 33.9 g/dL (ref 30.0–36.0)
MCV: 89.8 fl (ref 78.0–100.0)
Monocytes Absolute: 0.9 10*3/uL (ref 0.1–1.0)
Neutro Abs: 4.8 10*3/uL (ref 1.4–7.7)
RBC: 3.7 Mil/uL — ABNORMAL LOW (ref 4.22–5.81)
WBC: 8.3 10*3/uL (ref 4.5–10.5)

## 2012-08-19 LAB — HEPATIC FUNCTION PANEL
AST: 20 U/L (ref 0–37)
Bilirubin, Direct: 0.1 mg/dL (ref 0.0–0.3)
Total Bilirubin: 0.6 mg/dL (ref 0.3–1.2)

## 2012-08-19 MED ORDER — METOPROLOL TARTRATE 25 MG PO TABS: 25.0000 mg | ORAL_TABLET | Freq: Two times a day (BID) | ORAL | Status: DC

## 2012-08-19 NOTE — Patient Instructions (Addendum)
Today we updated your med list in our EPIC system...    Continue your current medications the same...    We decided to INCREASE your Metoprolol 25mg  back to one tab TWICE daily (for your BP)...  Today we did your follow up CXR (it's been 4 yrs) and Blood work...    We will contact you w/ the results when available...   We will arrange for an ASAP recheck w/ Cardiology/ [name] for Cardiac clearance for your back surgery...  Call for any questions or if we can be of service in any way...  Let's plan a follow up visit in 73mo, sooner if needed for problems.Marland KitchenMarland Kitchen

## 2012-08-19 NOTE — Telephone Encounter (Signed)
SN is aware and we will leave the metoprolol at this dose for now.  thanks

## 2012-08-19 NOTE — Telephone Encounter (Signed)
Called and spoke with pt and he is aware that we have tried to get him in sooner to see somone and they have no openings any sooner.    Per SN--  Have the pt call over and speak with cardiology---see if they can put him on the cancellation list to see if they can get him in sooner. Pt is in a lot of pain and is wanting to have the back surgery sooner.  Pt will call and see if there is any way to get in sooner and try and speak with Dr. Burt Knack.

## 2012-08-19 NOTE — Telephone Encounter (Signed)
New Prob      Pt has some questions regarding a surgical clearance appointment. Please call.

## 2012-08-19 NOTE — Progress Notes (Signed)
Subjective:    Patient ID: Henry [name], male    DOB: [birth year], 79 y.o.   MRN: [id number]  HPI 79 y/o WM here for a follow up visit... he has mult med problems as listed and is followed by DrBBrodie and DrPerry as well... he gets his meds filled at the [facility] which makes for a complicated array of perscribed meds and substituted alternatives...  ~  December 07, 2008:  saw DrBB for Cards f/u 5/10- doing satis, no changes made... today c/o some leg cramps at night & we discussed the Tonic water/ Mustard/ Soap bar tricks... he has PVD and legs also "tighten up" while walking- stable pattern, no worsening, he continues to exercise w/ treadmill etc...  some incr stress caring for wife w/ memory problems... OK Flu shot today.  ~  June 07, 2009:  true to form the [place] has changed his Metformin to Glyburide- initially 5mg /d then had to decr to 1/2 tab Qam due to side effects... he notes sugars are "up & down" at home> he pays $9 per Rx at [place] I encouraged him to look into the [facility]/ [facility] $4 lists... for now we will continue the [place] meds so as not to confuse &/ or conflict the pt any further... he saw DrBBrodie4/11- stable, no angina... referred to [facility] [name] for f/u PVD> art dopplers 11/10 w/ >50% SFA stenosis & ABI's 0.9.Marland Kitchen. he continues on exercise program w/ stable claud symptoms...  ~  January 02, 2010:  he saw Podiatry 9/11 w/ painful burning feet (likely DM neuropathy) & they sent him to [facility] for Art Dopplers re-demontrating his known PAD (ABIs sl worse) w/ hx long standing stable claud symptoms (NOTE: he is followed by [name] Q43mo for LeB PV service & has f/u 12/11)... we discussed trial Neurontin for the burning but he wants tothink about it- alternatively we could send him to Neurology for further eval... he saw DrBB for another Cards f/u 10/11- remains stable & no changes made...  he notes that the [facility] changed his Simva40 to [place] f/u FLP looks good...  he states that BS continued to drop <50  on the Glipizide2.5mg  from the [facility] so he stopped this med & f/u FBS=149, A1c=6.7 on diet alone (can't take Metformin  due to renal insuffic)...  ~  August 02, 2010:  21mo ROV & Henry [name] has been going to the [facility] for his meds & his care> his med list indicates that they stopped his Lisinopril (?due to renal insuffic) & substituted Losartan 25mg /d, and they restarted his HCTZ at 12.5mg /d due to edema... His BP remains well controlled, but he notes some dizziness & light headed in the AM on arising since the changes were made> he is asked to monitor BP at home (supine & standing), change positions slowly, & he has f/u at the [place] next month (he will bring his labs for [place] to review)...  Another issue is the right sm toe> he has a lesion (?wart-like growth) on the medial side of the toe & it rubs against his 4th toe w/ pain/ discomfort> advised to keep cotton vs gauze betw toes to decr rubbing & consider Podiatric consult but I voiced caution w/ any surg approach due to his periph vasc dis (I noted that non-healing of any surg could lead to ray amput or worse)... He saw [name] 12/11 last- note reviewed... He is under mod stress caring for wife w/ dementia & he does all the cooking etc...  ~  August 19, 2012:  42yr ROV & Henry [name] returns for pre-op medical clearance for back surgery planned by DrBrooks at [facility]; He continues to get his general medical care & meds from the [facility], he is followed by [name] for [employer]; We reviewed the following medical problems during today's office visit >> Note- he did not bring med bottles (he does not know his medications), med list, or prev labs from the [facility]...    He was [facility] by [facility] 2/13 - 04/13/12 w/ dizziness, n/v, dehydration- felt to be due to a gastroenteritis; resolved w/ supportive therapy, IV fluids, rec for vestib therapy as outpt; he never f/u w/ anyone after this adm... [place] 2/14 showed sm vessel dis, vasc calcif, global atrophy (no hemorrhage or large area  infarct);  MRI Brain confirmed mod advanced atrophy, sm vessel dis, no infarct or hem...     HBP> on Metop25/d, Amlod10, Losar25, Lasix20;  BP= 160/64 but he is in pain & ?took his meds today; he denies CP, palpit, ch in SOB, edema, etc; asked to incr Metop25Bid...    ASHD> on ASA81; followed by [name] & last seen 12/13; [facility]; he gets back pain & claud w/ mod activ- no angina; last NuclearStressTest 9/08 was normal without ischemia and EF=68%; can't find Echo in epic or Centric...    Carotid Art Dis> on ASA81; he denies cerebral ischemic symptoms; CT/ MRI Brain 2/14 as above; last CDoppler 1/13 showed mild heterogeneous plaque bilat w/ 40-59% bilat ICAstenoses & f/u is overdue...     Periph Vasc Dis> on ASA81; he has stable claud w/ ambulation but lim by back pain now; last Art Dopplers 1/13 showed ABIs .92R & .80L, TBIs were abn but w/o risk of tissue loss; overdue for yearly f/u studies...    CHOL> on Lip20 (off prev Crestor); Labs done at [facility] but he didn't bring reports; he is not fasting today for recheck...    DM> on diet alone (off prev Metform); Labs 2/14 Hosp w/ BS= 150-200+ range; now BS=132, A1c=6.6; he follows low carb diet...    Renal Insuffic> he knows to avoid NSAIDs; Labs 2/14 w/ Creat= 1.4-1.6 range; Labs today showed BUN= 21, Creat= 1.7    DJD, LBP w/ spinal stenosis> on Tramadol50 prn; followed by DrRamos=> DrBrooks at [facility]; he has had PT, he rates back pain 10/10 w/ radiation to right leg; they did MRI w/ report of sp stenosis severe at L4-5; he wants relief & they plan surg if cleared.    Anemia> on FeSO4 325mg /d; Labs 2/14 w/ Hg= 10-11 range; Labs now show Hg= 11.3 We reviewed prob list, meds, xrays and labs> see below for updates >>  CXR 6/14 showed normal heart size, tortuous Ao, mild COPD changes w/ clear lungs/ NAD, DJD in Tspine...  LABS 6/14:  [place]- ok x BS=132 A1c=6.6 Creat=1.7;  CBC- Hg=11.3;  TSH=5.84;  B12=530;   BNP=198...          Problem List:  HYPERTENSION (ICD-401.9) - controlled on 4 meds:  METOPROLOL 25mg Bid, AMLODIPINE 10mg /d, LOSARTAN 25mg /d, and HCT 25mg - 1/2 qd... BP=128/68 and doing well except he notes sl dizzy in the AM... denies HA, fatigue, visual changes, CP, palipit, syncope, dyspnea, edema, etc... ~  2DEcho 1996 showed mild LVH, AoV sclerosis & mild AI, EF= 60%... ~  labs 11/11 showed BUN=36, Creat=2.1, K=5.1.Marland KitchenMarland Kitchen rec to stop HCTZ... ~  The [facility] subseq re-started 12.5mg  HCTZ in the interim due to edema... ~  6/14:  on Metop25/d, Amlod10, Losar25, Lasix20;  BP= 160/64 but he is in pain & ?took his meds today; he denies CP, palpit, ch in SOB, edema, etc; asked to incr Metop25Bid.   ARTERIOSCLEROTIC HEART DISEASE (ICD-414.00) - on ASA 81mg /d... long time pt of DrJoeLeBauer & followed by DrBrodie now... s/p CABG in 2000 by DrBartle (for L main dis)... he is active & w/o angina, palpit, change in SOB/ DOE, etc... ~  NuclearStressTest 9/08 was normal without ischemia and EF=68%... ~  12/13: he saw [name] for f/u CAD, s/p CABG, PAD, Carotid Art dis> no angina, bilat calf pain w/ ambulation but felt to be stable; he adjusted meds, but [facility] changed [facility] after that...  CAROTID ARTERY DISEASE>  PERIPHERAL VASCULAR DISEASE (ICD-443.9) - on ASA daily and he has bilat femoral bruits... he has seen DrDowney in the past (now [name]) w/ exerc program recommended... he walks regularly w/ stable claud symptoms, no sores or lesions on LE's... ~  ABI's 10/08 were normal bilat = 1.0.Marland KitchenMarland Kitchen waveforms were biphasic however (no change from 2005). ~  CDoppler's 10/08 showed mild bilat carotid dis w/ 40-59% RICA stenosis, and [id number] LICA stenosis. ~  CDoppler's 10/09 showed the same= stable. ~  CDoppler's 11/10 showed stable mild carotid dis bilat> [id number] RICA & [id number] LICA stenoses. ~  ABI's 11/10 showed prob mod severe >50% distal right SFA stenosis, & mod dis in left SFA, ABI's=0.9 ~  Podiarty sent him to [facility] for  ArtDopplers 9/11> ABIs 0.82 R & 0.74L w/ bilat SFA dis L>R, toe pressures adeq for healing... ~  6/14: on ASA81; he denies cerebral ischemic symptoms; CT/ MRI Brain 2/14 showed sm vessel dis, vasc calcif, global atrophy (no hemorrhage or large area infarct); last CDoppler 1/13 showed mild heterogeneous plaque bilat w/ 40-59% bilat ICAstenoses... ~  6/14:  on ASA81; he has stable claud w/ ambulation but lim by back pain now; last Art Dopplers 1/13 showed ABIs .92R & .80L, TBIs were abn but w/o risk of tissue loss; overdue for yearly f/u studies of Carotids & ABIs...  HYPERCHOLESTEROLEMIA (ICD-272.0) - prev controlled on Simva40 + diet, the [facility] CRESTOR 10mg /d. ~  [place] 2/09 on Simva40 showed Tchol 169, TG 206, HDL 37, LDL 96 ~  FLP 4/10 on Simva40 showed TChol 146, TG 145, HDL 36, LDL 82 ~  FLP 10/10 on Simva40 showed TChol 166, TG 130, HDL 38, LDL 102 ~  FLP 4/11 on Simva40 showed TChol 138, TG 87, HDL 40, LDL 80... [facility] subseq ch to Cres10 ~  FLP 11/11 on Cres10 showed TChol 147, TG 161, HDL 34, LDL 81 ~  6/12:  He indicates interval labs done by the [facility] but he didn't bring copies for [place] to review... ~  6/14: on Lip20 (off prev Crestor); Labs done at [facility] but he didn't bring reports; he is not fasting today for recheck.   DIABETES MELLITUS (ICD-250.00) - he's struggled w/ diet Rx... wt stable ~175 range... prev on Metformin 500Bid but [facility] changed to GLYBURIDE 5mg /d, then decr to 1/2 tab daily due to side effects & he stopped due to hypogly. ~  labs 10/08 showed FBS= 134, and HgA1c= 5.4... ~  labs 6/09 showed BS= 101, HgA1c= 6.4.Marland KitchenMarland Kitchen monofilament test is normal. ~  Eye eval 1/10 at [facility] showed no DM retinopathy... ~  labs 4/10 on MetformBid showed BS= 106, A1c= 6.8, Urine Microalb= sl incr @ 7.8mg % ~  labs 10/10 showed BS= 119, A1c= 6.5.Marland KitchenMarland Kitchen  NOTE: 2/11 [facility] ch to Glybur5mg - 1/2 daily. ~  labs 4/11 on Glyb2.5 showed BS= 97, A1c= 6.6.Marland KitchenMarland Kitchen pt subseq stopped Glybur due to low sugars. ~  labs  11/11 off meds showed BS= 149, A1c= 6.7.Marland KitchenMarland Kitchen coninue diet Rx. ~  6/12:  He indicates interim labs done by the [facility] but he didn't bring copies for [place] to review... ~  6/14: on diet alone (off prev Metform); Labs 2/14 Hosp w/ BS= 150-200+ range; now BS=132, A1c=6.6; he follows low carb diet  RENAL INSUFFICIENCY (ICD-588.9) - Creat 1.8-2.0 in the past... ~  labs 6/09 showed BUN= 31, Creat= 1.6... ~  labs 9/09 showed BUN= 25, Creat= 1.6, K= 4.5 ~  labs 4/10 showed BUN= 26, Creat= 1.6, K= 5.0 ~  labs 10/10 showed BUN= 21, Creat= 1.6 ~  labs 4/11 showed BUN= 30, Creat= 1.9..Marland Kitchen advised to stay well hydrated. ~  labs 11/11 showed BUN= 36, Creat= 2.1.Marland KitchenMarland Kitchen rec to stop the HCTZ ~  6/12:  He indicates that the [facility] restarted HCTZ 12.5mg /d & they are following labs... ~  6/14: he knows to avoid NSAIDs; Labs 2/14 w/ Creat= 1.4-1.6 range; Labs today showed BUN= 21, Creat= 1.7..Marland Kitchen  DEGENERATIVE JOINT DISEASE (ICD-715.90) LOW BACK PAIN >> he has severe sp stenosis L4-5 per MRI at [facility] 6/14... ~  Notes and Records fro [facility]- DrRamos, DrBrooks> reviewed, they plan surgery if cleared fro CV standpoint...  Hx of ANEMIA OF CHRONIC DISEASE (ICD-285.29) - eval by DrGranfortuna w/ ACD secondary to his renal insuffic and Rx w/ aranesp periodically... nothing else found on his extensive eval... he was taking Ferrous Gluconate daily (it no longer makes him tired)... ~  labs 2/09 w/ Hg= 11.2.Marland Kitchen. ~  labs 6/09 w/ Hg= 11.0.Marland Kitchen. rec> OTC Fe prep but he never got it! ~  labs 9/09 showed Hg= 10.5, Fe= 81 ~  labs 4/10 showed Hg= 10.5, MCV= 92, Fe= 77 (22%sat), B12= 590, Folate>20... rec> OTC Fe daily. ~  labs 10/10 showed Hg= 10.3, Fe= 88 ~  labs 4/11 showed Hg= 10.9,  Fe= 58... continue Ferrous Gluc daily w/ VitC ~  labs 11/11 showed Hg= 11.6 ~  6/12:  Again he indicated that the [facility] is following his labs, aske to bring copies for [place] to review. ~  6/14: on FeSO4 325mg /d; Labs 2/14 w/ Hg= 10-11 range;  Labs now show Hg= 11.3   Past Surgical History  Procedure Laterality Date  . Cataract surgery    . Coronary artery bypass graft  2000    by Dr. Cyndia Bent    Outpatient Encounter Prescriptions as of 08/19/2012  Medication Sig Dispense Refill  . acetaminophen (TYLENOL) 325 MG tablet Take 650 mg by mouth every 6 (six) hours as needed for pain.      Marland Kitchen amLODipine (NORVASC) 10 MG tablet Take 10 mg by mouth daily.        . Ascorbic Acid (VITAMIN C PO) Take one tablet of CENTRUM SILVER VITAMIN C in the morning      . aspirin 81 MG tablet Take 81 mg by mouth daily.      Marland Kitchen atorvastatin (LIPITOR) 20 MG tablet Take 20 mg by mouth daily.      . Calcium Carbonate-Vitamin D (CALCIUM 600/VITAMIN D PO) Take 1 calcium 600 mg vitamin D in the morning and one in the evening as directed      . ferrous gluconate (FERGON) 325 MG tablet Take 325 mg by mouth daily with breakfast.       .  furosemide (LASIX) 20 MG tablet Take 1 tablet (20 mg total) by mouth daily.  30 tablet  0  . glucose blood (ACCU-CHEK AVIVA PLUS) test strip Use as instructed  50 each  prn  . Lancets (ACCU-CHEK MULTICLIX) lancets Use as instructed  100 each  prn  . losartan (COZAAR) 25 MG tablet Take 25 mg by mouth daily.      . metoprolol tartrate (LOPRESSOR) 25 MG tablet Take 25 mg by mouth daily.       . Multiple Vitamins-Minerals (CENTRUM SILVER PO) Take 1 tablet by mouth daily.        . traMADol (ULTRAM) 50 MG tablet Take 50 mg by mouth every 6 (six) hours as needed for pain.      . rosuvastatin (CRESTOR) 10 MG tablet Take 10 mg by mouth daily.        . [DISCONTINUED] aspirin 325 MG EC tablet Take 325 mg by mouth daily.        . [DISCONTINUED] losartan (COZAAR) 50 MG tablet Take 1 tablet (50 mg total) by mouth daily.  90 tablet  3   No facility-administered encounter medications on file as of 08/19/2012.    No Known Allergies   Review of Systems        See HPI - all other systems neg except as noted... The patient complains of  decreased hearing, dyspnea on exertion, and difficulty walking.  The patient denies anorexia, fever, weight loss, weight gain, vision loss, hoarseness, chest pain, syncope, peripheral edema, prolonged cough, headaches, hemoptysis, abdominal pain, melena, hematochezia, severe indigestion/heartburn, hematuria, incontinence, muscle weakness, suspicious skin lesions, transient blindness, depression, unusual weight change, abnormal bleeding, enlarged lymph nodes, and angioedema.    Objective:   Physical Exam     WD, WN, 79 y/o WM in NAD... GENERAL:  Alert & oriented; pleasant & cooperative... HEENT:  [place]/AT, EOM-wnl, PERRLA, EACs-clear, TMs-wnl, NOSE-clear, THROAT-clear & wnl. NECK:  Supple w/ fairROM; no JVD; normal carotid impulses w/ faint right bruit; no thyromegaly or nodules palpated; no lymphadenopathy. CHEST:  Clear to P & A; without wheezes/ rales/ or rhonchi heard... HEART:  Regular, gr 1/6 SEM without rubs or gallops heard... ABDOMEN:  Soft & nontender; normal bowel sounds; no organomegaly or masses detected. EXT: without deformities, mod arthritic changes; no varicose veins/ venous insuffic/ or edema. he has bilat fem art bruits & minor pressure area betw right 4th & 5th toes... NEURO:  CN's intact;  no focal neuro deficits... DERM:  Lesion on medial side of right small toe, sl tender...   Assessment & Plan:    HBP>  BP is fair on the current regimen; meds adjusted by the [facility]; we rec incr Metoprolol 25mg  Bid now...  ASHD/ CAD>  Followed by [name] for LeB Cards & the [facility]; continue current meds...  ASPVD- Carotid art dis & PAD>  Decr ABIs & Carotid dis> on ASA 81mg /d; followed by [name] & due for repeat CDoppler & ABIs; refer to Cards for Pre-op eval...  CHOL>  On Cres10 & FLP followed by the [facility] now he says; not fasting today for recheck...  DM>  On diet alone & labs also followed by there [facility]; labs today showed BS=132, A1c=6.6 (OK)...  Renal Insuffic>  Creat stable in  the 1.6-1.7 range on current meds, butter off NSAIDs & HCTZ...  Anemia>  Hg seems stable in the 10-11 range w/ ACD...   Patient's Medications  New Prescriptions   No medications on file  Previous Medications  ACETAMINOPHEN (TYLENOL) 325 MG TABLET    Take 650 mg by mouth every 6 (six) hours as needed for pain.   AMLODIPINE (NORVASC) 10 MG TABLET    Take 10 mg by mouth daily.     ASCORBIC ACID (VITAMIN C PO)    Take one tablet of CENTRUM SILVER VITAMIN C in the morning   ASPIRIN 81 MG TABLET    Take 81 mg by mouth daily.   ATORVASTATIN (LIPITOR) 20 MG TABLET    Take 20 mg by mouth daily.   CALCIUM CARBONATE-VITAMIN D (CALCIUM 600/VITAMIN D PO)    Take 1 calcium 600 mg vitamin D in the morning and one in the evening as directed   FERROUS GLUCONATE (FERGON) 325 MG TABLET    Take 325 mg by mouth daily with breakfast.    FUROSEMIDE (LASIX) 20 MG TABLET    Take 1 tablet (20 mg total) by mouth daily.   GLUCOSE BLOOD (ACCU-CHEK AVIVA PLUS) TEST STRIP    Use as instructed   LANCETS (ACCU-CHEK MULTICLIX) LANCETS    Use as instructed   LOSARTAN (COZAAR) 25 MG TABLET    Take 25 mg by mouth daily.   MULTIPLE VITAMINS-MINERALS (CENTRUM SILVER PO)    Take 1 tablet by mouth daily.     ROSUVASTATIN (CRESTOR) 10 MG TABLET    Take 10 mg by mouth daily.     TRAMADOL (ULTRAM) 50 MG TABLET    Take 50 mg by mouth every 6 (six) hours as needed for pain.  Modified Medications   Modified Medication Previous Medication   METOPROLOL TARTRATE (LOPRESSOR) 25 MG TABLET metoprolol tartrate (LOPRESSOR) 25 MG tablet      Take 1 tablet (25 mg total) by mouth 2 (two) times daily.    Take 25 mg by mouth daily.   Discontinued Medications   ASPIRIN 325 MG EC TABLET    Take 325 mg by mouth daily.     LOSARTAN (COZAAR) 50 MG TABLET    Take 1 tablet (50 mg total) by mouth daily.

## 2012-08-19 NOTE — Telephone Encounter (Signed)
error 

## 2012-08-19 NOTE — Telephone Encounter (Signed)
Spoke to pt. He states that when he came in for his ROV today he had a summary from [place]. They had his Metoprolol dosage wrong - 25mg  Q Day. He called back to let us know that the dosage we have on file (25mg  BID) is correct.  SN - FYI

## 2012-08-19 NOTE — Telephone Encounter (Signed)
Dr Rolena Infante at [place] will be performing back surgery (not scheduled yet). The pt denies any issues with CP and SOB since his appointment in December with Dr Burt Knack. The pt saw Dr Rolena Infante yesterday and needs to have surgery ASAP and is in a lot of pain.  I have contacted the pt and moved up his appointment to 08/25/12 with Truitt Merle NP. I will obtain a request for cardiac clearance from Dr Valla Leaver office.

## 2012-08-22 NOTE — Telephone Encounter (Signed)
Per Dr Burt Knack the pt should keep appointment with Truitt Merle NP on 08/25/12 to address surgical clearance.  I attempted to reach the pt to make him aware that he needs to keep appointment but he did not answer.

## 2012-08-25 ENCOUNTER — Encounter: Payer: Self-pay | Admitting: Nurse Practitioner

## 2012-08-25 ENCOUNTER — Ambulatory Visit (INDEPENDENT_AMBULATORY_CARE_PROVIDER_SITE_OTHER): Payer: Medicare Other | Admitting: Nurse Practitioner

## 2012-08-25 ENCOUNTER — Ambulatory Visit: Payer: Medicare Other | Admitting: Physical Therapy

## 2012-08-25 VITALS — BP 150/70 | HR 56 | Ht 69.0 in | Wt 159.4 lb

## 2012-08-25 DIAGNOSIS — Z01818 Encounter for other preprocedural examination: Secondary | ICD-10-CM

## 2012-08-25 DIAGNOSIS — I25810 Atherosclerosis of coronary artery bypass graft(s) without angina pectoris: Secondary | ICD-10-CM | POA: Diagnosis not present

## 2012-08-25 NOTE — Progress Notes (Signed)
Henry [name] Date of Birth: Jan 20, 1925 Medical Record [id number]  History of Present Illness: Mr. Henry [name] is seen back today for a pre op visit. Seen for Dr. Burt Knack. He has known CAD with remote CABG in 2000 per Dr. Cyndia Bent. Other issues include PVD, HTN, HLD, CKD stage III, and DJD.   Last seen here in December. Had no anginal symptoms. Had stable claudication.   He comes in today. He is here alone. He is wanting to have back surgery per Dr. Rolena Infante. Scheduled for July 16th. Has spinal stenosis. Has seen Dr. Lenna Gilford last week. There was some question about the dosing of his metoprolol but he hasdbeen taking BID chronically.  He is not as active now because of chronic pain. No chest pain. Not dizzy or lightheaded. No syncope. Only short of breath if he overexerts. He continues to care for his wife with Alzheimer's. No recent stress testing. His pain is limiting his quality of life.   Current Outpatient Prescriptions  Medication Sig Dispense Refill  . acetaminophen (TYLENOL) 325 MG tablet Take 650 mg by mouth every 6 (six) hours as needed for pain.      Marland Kitchen amLODipine (NORVASC) 10 MG tablet Take 10 mg by mouth daily.        . Ascorbic Acid (VITAMIN C PO) Take one tablet of CENTRUM SILVER VITAMIN C in the morning      . aspirin 81 MG tablet Take 81 mg by mouth daily.      Marland Kitchen atorvastatin (LIPITOR) 20 MG tablet Take 20 mg by mouth daily.      . Calcium Carbonate-Vitamin D (CALCIUM 600/VITAMIN D PO) Take 1 calcium 600 mg vitamin D in the morning and one in the evening as directed      . ferrous gluconate (FERGON) 325 MG tablet Take 325 mg by mouth daily with breakfast.       . furosemide (LASIX) 20 MG tablet Take 1 tablet (20 mg total) by mouth daily.  30 tablet  0  . glucose blood (ACCU-CHEK AVIVA PLUS) test strip Use as instructed  50 each  prn  . Lancets (ACCU-CHEK MULTICLIX) lancets Use as instructed  100 each  prn  . losartan (COZAAR) 25 MG tablet Take 25 mg by mouth daily.      .  metoprolol tartrate (LOPRESSOR) 25 MG tablet Take 1 tablet (25 mg total) by mouth 2 (two) times daily.  60 tablet  5  . Multiple Vitamins-Minerals (CENTRUM SILVER PO) Take 1 tablet by mouth daily.        . rosuvastatin (CRESTOR) 10 MG tablet Take 10 mg by mouth daily.        . traMADol (ULTRAM) 50 MG tablet Take 50 mg by mouth every 6 (six) hours as needed for pain.       No current facility-administered medications for this visit.    No Known Allergies  Past Medical History  Diagnosis Date  . Hypertension   . CAD of autologous bypass graft   . Carotid artery disease   . PVD (peripheral vascular disease) with claudication   . Hypercholesteremia   . Renal insufficiency   . DJD (degenerative joint disease)   . History of anemia of chronic disease     Past Surgical History  Procedure Laterality Date  . Cataract surgery    . Coronary artery bypass graft  2000    by Dr. Cyndia Bent    History  Smoking status  . Former Smoker -- 1.00 packs/day for  10 years  . Types: Cigarettes  . Quit date: 02/26/1953  Smokeless tobacco  . Never Used    History  Alcohol Use No    No family history on file.  Review of Systems: The review of systems is per the HPI.  All other systems were reviewed and are negative.  Physical Exam: BP 150/70  Pulse 56  Ht 5\' 9"  (1.753 m)  Wt 159 lb 6.4 oz (72.303 kg)  BMI 23.53 kg/m2 Patient is very pleasant and in no acute distress. Looks younger than his stated age. Skin is warm and dry. Color is normal.  HEENT is unremarkable. Normocephalic/atraumatic. PERRL. Sclera are nonicteric. Neck is supple. No masses. No JVD. Lungs are clear. Cardiac exam shows a regular rate and rhythm. Abdomen is soft. Extremities are without edema. Gait and ROM are intact. No gross neurologic deficits noted.  LABORATORY DATA: EKG today shows sinus bradycardia, 1st degree AV block.   Lab Results  Component Value Date   WBC 8.3 08/19/2012   HGB 11.3* 08/19/2012   HCT 33.2*  08/19/2012   PLT 210.0 08/19/2012   GLUCOSE 132* 08/19/2012   CHOL 154 03/03/2012   TRIG 118.0 03/03/2012   HDL 39.10 03/03/2012   LDLDIRECT 96.2 04/04/2007   LDLCALC 91 03/03/2012   ALT 15 08/19/2012   AST 20 08/19/2012   NA 138 08/19/2012   K 4.4 08/19/2012   CL 104 08/19/2012   CREATININE 1.7* 08/19/2012   BUN 21 08/19/2012   CO2 29 08/19/2012   TSH 5.84* 08/19/2012   PSA 1.47 01/03/2010   INR 1.02 04/10/2012   HGBA1C 6.6* 08/19/2012   MICROALBUR 7.8* 06/08/2008     Assessment / Plan: 1. CAD - remote CABG in 2000 per Dr. Cyndia Bent - currently with no symptoms. Not as active now due to back issues/spinal stenosis.   2. HTN - BP is satisfactory. He does not monitor at home.   3. CKD   4. Pre op clearance - I think he is at added risk just in light of his age. He has not had stress testing in many years. We will obtain a Lexiscan to further discern.   Patient is agreeable to this plan and will call if any problems develop in the interim.   Burtis Junes, RN, ANP-C [facility] [address] [place], [place]  [address]

## 2012-08-25 NOTE — Patient Instructions (Addendum)
Stay on your current medicines.   We are going to set up a stress Myoview (Lexiscan) for this week or early next week  See Dr. Burt Knack in December as planned  Call the [facility] office at [phone] if you have any questions, problems or concerns.

## 2012-08-26 ENCOUNTER — Encounter: Payer: Self-pay | Admitting: Cardiovascular Disease

## 2012-08-26 NOTE — Telephone Encounter (Signed)
New problem   Sherri[facility] calling in reference to cardiac clearance that was faxed over. Please fax to [phone]

## 2012-08-26 NOTE — Telephone Encounter (Signed)
This encounter was created in error - please disregard.

## 2012-09-03 ENCOUNTER — Ambulatory Visit (HOSPITAL_COMMUNITY): Payer: Medicare Other | Attending: Cardiology | Admitting: Radiology

## 2012-09-03 VITALS — BP 161/60 | HR 61 | Ht 69.0 in | Wt 156.0 lb

## 2012-09-03 DIAGNOSIS — I2510 Atherosclerotic heart disease of native coronary artery without angina pectoris: Secondary | ICD-10-CM | POA: Diagnosis not present

## 2012-09-03 DIAGNOSIS — R0609 Other forms of dyspnea: Secondary | ICD-10-CM | POA: Diagnosis not present

## 2012-09-03 DIAGNOSIS — Z01818 Encounter for other preprocedural examination: Secondary | ICD-10-CM

## 2012-09-03 DIAGNOSIS — E119 Type 2 diabetes mellitus without complications: Secondary | ICD-10-CM | POA: Insufficient documentation

## 2012-09-03 DIAGNOSIS — I739 Peripheral vascular disease, unspecified: Secondary | ICD-10-CM | POA: Insufficient documentation

## 2012-09-03 DIAGNOSIS — I10 Essential (primary) hypertension: Secondary | ICD-10-CM | POA: Diagnosis not present

## 2012-09-03 DIAGNOSIS — R42 Dizziness and giddiness: Secondary | ICD-10-CM | POA: Diagnosis not present

## 2012-09-03 DIAGNOSIS — Z87891 Personal history of nicotine dependence: Secondary | ICD-10-CM | POA: Insufficient documentation

## 2012-09-03 DIAGNOSIS — R0602 Shortness of breath: Secondary | ICD-10-CM | POA: Diagnosis not present

## 2012-09-03 DIAGNOSIS — R0989 Other specified symptoms and signs involving the circulatory and respiratory systems: Secondary | ICD-10-CM | POA: Insufficient documentation

## 2012-09-03 DIAGNOSIS — I25810 Atherosclerosis of coronary artery bypass graft(s) without angina pectoris: Secondary | ICD-10-CM

## 2012-09-03 DIAGNOSIS — I6529 Occlusion and stenosis of unspecified carotid artery: Secondary | ICD-10-CM | POA: Insufficient documentation

## 2012-09-03 MED ORDER — TECHNETIUM TC 99M SESTAMIBI GENERIC - CARDIOLITE
30.0000 | Freq: Once | INTRAVENOUS | Status: AC | PRN
  Administered 2012-09-03: 30 via INTRAVENOUS

## 2012-09-03 MED ORDER — TECHNETIUM TC 99M SESTAMIBI GENERIC - CARDIOLITE
10.0000 | Freq: Once | INTRAVENOUS | Status: AC | PRN
  Administered 2012-09-03: 10 via INTRAVENOUS

## 2012-09-03 MED ORDER — REGADENOSON 0.4 MG/5ML IV SOLN
0.4000 mg | Freq: Once | INTRAVENOUS | Status: AC
  Administered 2012-09-03: 0.4 mg via INTRAVENOUS

## 2012-09-03 NOTE — Progress Notes (Signed)
[facility]UC[facility] [place], [place] [address] [phone]    Cardiology Nuclear Med Study  Henry [name] is a 79 y.o. male     MRN : [id number]     DOB: 08/03/1924  Procedure Date: 09/03/2012  Nuclear Med Background Indication for Stress Test:  Evaluation for Ischemia and Pending Clearance for Back Surgery Dr. Melina Schools History:  '00 CABG; '08 [id number] (no report) Cardiac Risk Factors: Carotid Disease, Claudication, History of Smoking, Hypertension, Lipids, NIDDM and PVD  Symptoms:  Dizziness and DOE   Nuclear Pre-Procedure Caffeine/Decaff Intake:  None NPO After: 9:00pm   Lungs:  Clear. O2 Sat: 97% on room air. IV 0.9% NS with Angio Cath:  22g  IV Site: R Antecubital  IV Started by:  Crissie Figures, RN  Chest Size (in):  42 Cup Size: n/a  Height: 5\' 9"  (1.753 m)  Weight:  156 lb (70.761 kg)  BMI:  Body mass index is 23.03 kg/(m^2). Tech Comments:  N/A    Nuclear Med Study 1 or 2 day study: 1 day  Stress Test Type:  Lexiscan  Reading MD: Darlin Coco, MD  Order Authorizing Provider:  Sherren Mocha, MD  Resting Radionuclide: Technetium 69m Sestamibi  Resting Radionuclide Dose: 11.0 mCi   Stress Radionuclide:  Technetium 24m Sestamibi  Stress Radionuclide Dose: 33.0 mCi           Stress Protocol Rest HR: 61 Stress HR: 76  Rest BP: 161/60 Stress BP: 144/57  Exercise Time (min): n/a METS: n/a   Predicted Max HR: 134 bpm % Max HR: 56.72 bpm Rate Pressure Product: 10944   Dose of Adenosine (mg):  n/a Dose of Lexiscan: 0.4 mg  Dose of Atropine (mg): n/a Dose of Dobutamine: n/a mcg/kg/min (at max HR)  Stress Test Technologist: Letta Moynahan, CMA-N  Nuclear Technologist:  Evalina Field, RT-N     Rest Procedure:  Myocardial perfusion imaging was performed at rest 45 minutes following the intravenous administration of Technetium 92m Sestamibi.  Rest ECG: NSR - Normal EKG  Stress Procedure:  The patient received IV Lexiscan 0.4  mg over 15-seconds.  Technetium 97m Sestamibi injected at 30-seconds.  Quantitative spect images were obtained after a 45 minute delay.  Stress ECG: No significant change from baseline ECG  QPS Raw Data Images:  Normal; no motion artifact; normal heart/lung ratio. Stress Images:  Normal homogeneous uptake in all areas of the myocardium. Rest Images:  Normal homogeneous uptake in all areas of the myocardium. Subtraction (SDS):  No evidence of ischemia. Transient Ischemic Dilatation (Normal <1.22):  n/a Lung/Heart Ratio (Normal <0.45):  0.40  Quantitative Gated Spect Images QGS EDV:  87 ml QGS ESV:  34 ml  Impression Exercise Capacity:  Lexiscan with no exercise. BP Response:  Normal blood pressure response. Clinical Symptoms:  No chest pain. ECG Impression:  No significant ST segment change suggestive of ischemia. Comparison with Prior Nuclear Study: No images to compare  Overall Impression:  Normal stress nuclear study.  LV Ejection Fraction: 61%.  LV Wall Motion:  NL LV Function; NL Wall Motion   [employer]

## 2012-09-05 ENCOUNTER — Encounter (HOSPITAL_COMMUNITY): Payer: Self-pay | Admitting: Pharmacy Technician

## 2012-09-05 ENCOUNTER — Telehealth: Payer: Self-pay | Admitting: Cardiovascular Disease

## 2012-09-05 NOTE — Telephone Encounter (Signed)
New problem    Status of cardiac clearance .

## 2012-09-05 NOTE — Telephone Encounter (Signed)
I spoke with Henry [name] and made her aware that the pt's stress test was normal.  Dr Burt Knack will review test next week and we will fax clearance ASAP.

## 2012-09-08 ENCOUNTER — Ambulatory Visit: Payer: Medicare Other | Admitting: Physician Assistant

## 2012-09-08 DIAGNOSIS — M48061 Spinal stenosis, lumbar region without neurogenic claudication: Secondary | ICD-10-CM | POA: Diagnosis not present

## 2012-09-08 NOTE — H&P (Signed)
  The patient is a 79 year old Male.   Subjective Transcription  REASON FOR CONSULTATION:  Severe back, buttock and bilateral leg pain for the last 3-4 months.    HISTORY OF PRESENT ILLNESS:  Henry [name] is a very pleasant, active gentleman, who has noticed a decompensation and debilitation in his pain and quality of life. There is no injury or trauma. He has just been continually getting worse over the last several months.    Objective Transcription  On clinical exam, he's a pleasant gentleman, who appears younger than his stated age. He's alert. He's oriented times 3. He has back pain with palpation and ROM in the lumbar spine, extension worse than forward flexion. No shortness of breath or chest pain. The abdomen is soft and nontender. No incontinence of bowel and bladder. Negative Babinski test. No clonus. 1+ symmetrical DTRs. No real hip, knee or ankle pain with joint ROM. Pain with lumbar ext.  Relief with flexion.  No focal motor deficits in LE.  Sensation decreased to LT diffusely in LE.    At this point in time, his MRI from 08-05-12 was reviewed. He has severe spinal stenosis at L4-5 with moderate bilateral recess stenosis, advanced facet arthrosis, and some disc bulging at 5-1 and at 2-3, but the predominant problem is at 4-5. He has a 2 mm. anterior listhesis at L3-4, 3 mm. anterior listhesis at 4-5, but no fracture or neoplasm.    Assessment & Plan l We have gone over the risks and benefits of surgery, which include infection, bleeding, nerve damage, death, stroke, paralysis, failure to heal, need for further surgery, ongoing or worse pain, loss of fixation, need for further surgery, CSF leak, loss of bowel or bladder control, ongoing or worse pain.  l Risks of surgery include, but are not limited to: Death, stroke, paralysis, nerve root damage/injury, bleeding, blood clots, loss of bowel/bladder control, sexual dysfunction, retrograde ejaculation,  hardware failure, or malposition, spinal fluid leak, adjacent segment disease, non-union, need for further surgery, ongoing or worse pain, injury to bladder, bowel and abdominal contents, infection and recurrent disc herniation    Assessments Transcription  The patient has recently seen my partner, Dr. Nelva Bush, and was diagnosed with spinal stenosis.  Plans Transcription At this point in time, I do think, given the severity of his stenosis and his complaints of neurogenic claudication, surgical intervention is reasonable. What I would recommend is an uninstrumented decompression and arthrodesis. Because of the slight slip at L4-5, I would recommend doing a decompression to address the stenosis and then doing an in situ arthrodesis to decrease the risk of further instability. The risks of surgery include infection, bleeding, nerve damage, death, stroke, paralysis, failure to heal, need for further surgery, ongoing or worse pain, adjacent segment disease (meaning the level above or below could break down). All of the patient's questions were addressed. He was present for the dictation. Once we have surgical clearance from his PCP, we will move forward with the case.

## 2012-09-08 NOTE — Progress Notes (Signed)
Anesthesia chart review: Patient is an 79 year old male scheduled for L4-5 lumbar decompression and in situ fusion by Dr. Rolena Infante on 09/10/2012.  Cardiac clearance that was received today. Currently no PAT appointment has been scheduled.  History includes former smoker, hypertension, CAD status post CABG '00, carotid artery occlusive disease (40-59% bilateral ICAS 02/2011), CKD stage III, hypercholesterolemia, anemia of chronic disease, PVD, DJD, cataract extraction. PCP is Dr. Teressa Lower who saw patient for medical clearance on 08/19/12 who recommended preoperative cardiology evaluation.  He also receives care from [facility].  Cardiologist is Dr. Burt Knack.  He was seen by Truitt Merle, NP on 08/25/12.  EKG then showed SB with first degree AV block.  Preoperative nuclear stress test was ordered and done on 09/04/12 and showed: Normal stress nuclear study. LV Ejection Fraction: 61%. LV Wall Motion: NL LV Function; NL Wall Motion. Dr. Burt Knack has since cleared him with "low risk of perioperative cardiac problems."  CXR on 08/19/12 showed: No acute cardiopulmonary disease. COPD/emphysema. Scarring in the lower lobes.  Labs from 08/19/12 were noted and reviewed by Dr. Lenna Gilford.  Cr was felt stable at 1.7 (range 1.4-2.1 since 2006).  A1C okay at 6.6.  TSH slightly elevated at 5.84 which he is watching for now.  He will need more recent labs preoperatively to be within the 14 day time frame.  If these are stable then it is anticipated that he can proceed as planned.   George Hugh [facility] Short Stay Center/Anesthesiology Phone [phone] 09/08/2012 3:18 PM

## 2012-09-08 NOTE — Progress Notes (Signed)
Patient ID: Henry [name], male   DOB: 12-26-1924, 79 y.o.   MRN: [id number]  Myoview reviewed and shows no ischemia. From cardiac perspective, OK to proceed with surgery at low risk of perioperative cardiac problems. Would be happy to see him if any problems arise in the post-op period.  Sherren Mocha 09/08/2012 2:36 PM

## 2012-09-08 NOTE — Progress Notes (Signed)
Pt aware of results. Truitt Merle NP signed off on surgical clearance earlier today.

## 2012-09-08 NOTE — Telephone Encounter (Signed)
Follow Up     Pt calling to follow up on clearance. I made pt aware of Lauren speaking to [facility] already regarding clearance. Pt would still like to speak to nurse.

## 2012-09-08 NOTE — Telephone Encounter (Signed)
I spoke with the pt and made him aware of myoview results.  Truitt Merle NP signed off on pt's surgical clearance and paperwork faxed. Marland Kitchen

## 2012-09-09 ENCOUNTER — Encounter (HOSPITAL_COMMUNITY): Payer: Self-pay | Admitting: *Deleted

## 2012-09-09 MED ORDER — DEXAMETHASONE SODIUM PHOSPHATE 4 MG/ML IJ SOLN
4.0000 mg | Freq: Once | INTRAMUSCULAR | Status: DC
  Filled 2012-09-09: qty 1

## 2012-09-09 MED ORDER — ACETAMINOPHEN 10 MG/ML IV SOLN: 1000.0000 mg | Freq: Once | INTRAVENOUS | Status: DC

## 2012-09-09 MED ORDER — CEFAZOLIN SODIUM-DEXTROSE 2-3 GM-% IV SOLR
2.0000 g | INTRAVENOUS | Status: AC
  Administered 2012-09-10: 2 g via INTRAVENOUS
  Filled 2012-09-09: qty 50

## 2012-09-09 NOTE — Progress Notes (Addendum)
Pt denies SOB and chest pain. Pt states that he is under the care of Dr. Burt Knack ( cardiologist ) at [facility]. Pt states that he thinks that he had an echo with Dr. Lenna Gilford [facility]) in June 2014 and  that he also had a cardiac cath around 5 to 8 years ago with Dr. Henrene Pastor [facility]). Pt was instructed to have nothing by mouth after midnight and to call # [phone] at 8:00AM on 09/10/12 for arrival time to [facility].  Pt states that he was told that surgery was scheduled for 4:00PM and he was concerned about eating since he was diabetic. Pt instructed to call Dr. Rolena Infante office for scheduling and intake clarification. Pt told to stop taking Aspirin and herbal medications. Do not take any NSAIDs ie: Ibuprofen, Advil, Naproxen or any medication containing Aspirin. In addition, pt instructed to bring brace on DOS.

## 2012-09-10 ENCOUNTER — Encounter (HOSPITAL_COMMUNITY): Payer: Self-pay | Admitting: Anesthesiology

## 2012-09-10 ENCOUNTER — Encounter (HOSPITAL_COMMUNITY): Payer: Self-pay | Admitting: Vascular Surgery

## 2012-09-10 ENCOUNTER — Ambulatory Visit (HOSPITAL_COMMUNITY): Payer: Medicare Other

## 2012-09-10 ENCOUNTER — Observation Stay (HOSPITAL_COMMUNITY)
Admission: RE | Admit: 2012-09-10 | Discharge: 2012-09-12 | Disposition: A | Discharge status: Home / Self Care | Payer: Medicare Other | Source: Ambulatory Visit | Attending: Orthopedic Surgery | Admitting: Orthopedic Surgery

## 2012-09-10 ENCOUNTER — Encounter (HOSPITAL_COMMUNITY)
Admission: RE | Disposition: A | Discharge status: Home / Self Care | Payer: Self-pay | Source: Ambulatory Visit | Attending: Orthopedic Surgery

## 2012-09-10 ENCOUNTER — Ambulatory Visit (HOSPITAL_COMMUNITY): Payer: Medicare Other | Admitting: Vascular Surgery

## 2012-09-10 DIAGNOSIS — M48062 Spinal stenosis, lumbar region with neurogenic claudication: Secondary | ICD-10-CM | POA: Diagnosis not present

## 2012-09-10 DIAGNOSIS — M4310 Spondylolisthesis, site unspecified: Secondary | ICD-10-CM | POA: Insufficient documentation

## 2012-09-10 DIAGNOSIS — E7800 Pure hypercholesterolemia, unspecified: Secondary | ICD-10-CM | POA: Insufficient documentation

## 2012-09-10 DIAGNOSIS — M48061 Spinal stenosis, lumbar region without neurogenic claudication: Secondary | ICD-10-CM | POA: Diagnosis not present

## 2012-09-10 DIAGNOSIS — I25810 Atherosclerosis of coronary artery bypass graft(s) without angina pectoris: Secondary | ICD-10-CM | POA: Insufficient documentation

## 2012-09-10 DIAGNOSIS — E119 Type 2 diabetes mellitus without complications: Secondary | ICD-10-CM | POA: Insufficient documentation

## 2012-09-10 DIAGNOSIS — I739 Peripheral vascular disease, unspecified: Secondary | ICD-10-CM | POA: Diagnosis not present

## 2012-09-10 DIAGNOSIS — I10 Essential (primary) hypertension: Secondary | ICD-10-CM | POA: Diagnosis not present

## 2012-09-10 DIAGNOSIS — M47817 Spondylosis without myelopathy or radiculopathy, lumbosacral region: Secondary | ICD-10-CM | POA: Diagnosis not present

## 2012-09-10 DIAGNOSIS — M539 Dorsopathy, unspecified: Secondary | ICD-10-CM | POA: Diagnosis not present

## 2012-09-10 DIAGNOSIS — N289 Disorder of kidney and ureter, unspecified: Secondary | ICD-10-CM | POA: Insufficient documentation

## 2012-09-10 DIAGNOSIS — I2510 Atherosclerotic heart disease of native coronary artery without angina pectoris: Secondary | ICD-10-CM | POA: Diagnosis not present

## 2012-09-10 HISTORY — PX: LUMBAR LAMINECTOMY/DECOMPRESSION MICRODISCECTOMY: SHX5026

## 2012-09-10 HISTORY — DX: Type 2 diabetes mellitus without complications: E11.9

## 2012-09-10 LAB — SURGICAL PCR SCREEN: Staphylococcus aureus: POSITIVE — AB

## 2012-09-10 LAB — CBC
MCH: 30.2 pg (ref 26.0–34.0)
Platelets: 214 10*3/uL (ref 150–400)
RBC: 3.84 MIL/uL — ABNORMAL LOW (ref 4.22–5.81)
WBC: 8.1 10*3/uL (ref 4.0–10.5)

## 2012-09-10 LAB — BASIC METABOLIC PANEL
Calcium: 9.9 mg/dL (ref 8.4–10.5)
GFR calc Af Amer: 42 mL/min — ABNORMAL LOW (ref >90)
GFR calc non Af Amer: 37 mL/min — ABNORMAL LOW (ref >90)
Sodium: 137 mEq/L (ref 135–145)

## 2012-09-10 LAB — ABO/RH: ABO/RH(D): A POS

## 2012-09-10 LAB — GLUCOSE, CAPILLARY
Glucose-Capillary: 89 mg/dL (ref 70–99)
Glucose-Capillary: 94 mg/dL (ref 70–99)
Glucose-Capillary: 84 mg/dL (ref 70–99)

## 2012-09-10 LAB — TYPE AND SCREEN
ABO/RH(D): A POS
Antibody Screen: NEGATIVE

## 2012-09-10 SURGERY — LUMBAR LAMINECTOMY/DECOMPRESSION MICRODISCECTOMY 1 LEVEL
Anesthesia: General | Site: Spine Lumbar | Wound class: Clean

## 2012-09-10 MED ORDER — DEXTROSE 5 % IV SOLN
INTRAVENOUS | Status: DC | PRN
  Administered 2012-09-10: 18:00:00 via INTRAVENOUS

## 2012-09-10 MED ORDER — FUROSEMIDE 20 MG PO TABS
20.0000 mg | ORAL_TABLET | Freq: Every day | ORAL | Status: DC | PRN
  Filled 2012-09-10: qty 1

## 2012-09-10 MED ORDER — SODIUM CHLORIDE 0.9 % IV SOLN: 250.0000 mL | INTRAVENOUS | Status: DC

## 2012-09-10 MED ORDER — METHOCARBAMOL 500 MG PO TABS
500.0000 mg | ORAL_TABLET | Freq: Four times a day (QID) | ORAL | Status: DC | PRN
  Administered 2012-09-11: 500 mg via ORAL
  Filled 2012-09-10: qty 1

## 2012-09-10 MED ORDER — PHENOL 1.4 % MT LIQD: 1.0000 | OROMUCOSAL | Status: DC | PRN

## 2012-09-10 MED ORDER — LIDOCAINE HCL (CARDIAC) 20 MG/ML IV SOLN
INTRAVENOUS | Status: DC | PRN
  Administered 2012-09-10: 100 mg via INTRAVENOUS

## 2012-09-10 MED ORDER — HEMOSTATIC AGENTS (NO CHARGE) OPTIME
TOPICAL | Status: DC | PRN
  Administered 2012-09-10: 1 via TOPICAL

## 2012-09-10 MED ORDER — INSULIN ASPART 100 UNIT/ML ~~LOC~~ SOLN
0.0000 [IU] | SUBCUTANEOUS | Status: DC
  Administered 2012-09-10: 2 [IU] via SUBCUTANEOUS
  Administered 2012-09-11: 8 [IU] via SUBCUTANEOUS
  Administered 2012-09-11: 2 [IU] via SUBCUTANEOUS
  Administered 2012-09-11: 3 [IU] via SUBCUTANEOUS

## 2012-09-10 MED ORDER — AMLODIPINE BESYLATE 10 MG PO TABS
10.0000 mg | ORAL_TABLET | Freq: Every evening | ORAL | Status: DC
  Administered 2012-09-10 – 2012-09-11 (×2): 10 mg via ORAL
  Filled 2012-09-10 (×3): qty 1

## 2012-09-10 MED ORDER — ACETAMINOPHEN 10 MG/ML IV SOLN
1000.0000 mg | Freq: Four times a day (QID) | INTRAVENOUS | Status: AC
  Administered 2012-09-10 – 2012-09-11 (×3): 1000 mg via INTRAVENOUS
  Filled 2012-09-10 (×4): qty 100

## 2012-09-10 MED ORDER — CEFAZOLIN SODIUM 1-5 GM-% IV SOLN
1.0000 g | Freq: Three times a day (TID) | INTRAVENOUS | Status: AC
  Administered 2012-09-11 (×2): 1 g via INTRAVENOUS
  Filled 2012-09-10 (×2): qty 50

## 2012-09-10 MED ORDER — NEOSTIGMINE METHYLSULFATE 1 MG/ML IJ SOLN
INTRAMUSCULAR | Status: DC | PRN
  Administered 2012-09-10: 5 mg via INTRAVENOUS

## 2012-09-10 MED ORDER — FENTANYL CITRATE 0.05 MG/ML IJ SOLN
INTRAMUSCULAR | Status: DC | PRN
  Administered 2012-09-10: 100 ug via INTRAVENOUS
  Administered 2012-09-10 (×2): 50 ug via INTRAVENOUS
  Administered 2012-09-10: 100 ug via INTRAVENOUS
  Administered 2012-09-10: 50 ug via INTRAVENOUS

## 2012-09-10 MED ORDER — DEXAMETHASONE SODIUM PHOSPHATE 4 MG/ML IJ SOLN
INTRAMUSCULAR | Status: DC | PRN
  Administered 2012-09-10: 4 mg via INTRAVENOUS

## 2012-09-10 MED ORDER — ONDANSETRON HCL 4 MG/2ML IJ SOLN
INTRAMUSCULAR | Status: DC | PRN
  Administered 2012-09-10: 4 mg via INTRAVENOUS

## 2012-09-10 MED ORDER — SODIUM CHLORIDE 0.9 % IJ SOLN
3.0000 mL | Freq: Two times a day (BID) | INTRAMUSCULAR | Status: DC
  Administered 2012-09-11 (×2): 3 mL via INTRAVENOUS

## 2012-09-10 MED ORDER — OXYCODONE HCL 5 MG/5ML PO SOLN: 5.0000 mg | Freq: Once | ORAL | Status: DC | PRN

## 2012-09-10 MED ORDER — ONDANSETRON HCL 4 MG/2ML IJ SOLN: 4.0000 mg | Freq: Once | INTRAMUSCULAR | Status: DC | PRN

## 2012-09-10 MED ORDER — MIDAZOLAM HCL 5 MG/5ML IJ SOLN
INTRAMUSCULAR | Status: DC | PRN
  Administered 2012-09-10: 2 mg via INTRAVENOUS

## 2012-09-10 MED ORDER — ROCURONIUM BROMIDE 100 MG/10ML IV SOLN
INTRAVENOUS | Status: DC | PRN
  Administered 2012-09-10: 50 mg via INTRAVENOUS

## 2012-09-10 MED ORDER — ZOLPIDEM TARTRATE 5 MG PO TABS: 5.0000 mg | ORAL_TABLET | Freq: Every evening | ORAL | Status: DC | PRN

## 2012-09-10 MED ORDER — ACETAMINOPHEN 10 MG/ML IV SOLN
INTRAVENOUS | Status: DC | PRN
  Administered 2012-09-10: 1000 mg via INTRAVENOUS

## 2012-09-10 MED ORDER — MEPERIDINE HCL 25 MG/ML IJ SOLN: 6.2500 mg | INTRAMUSCULAR | Status: DC | PRN

## 2012-09-10 MED ORDER — OXYCODONE HCL 5 MG PO TABS: 10.0000 mg | ORAL_TABLET | ORAL | Status: DC | PRN

## 2012-09-10 MED ORDER — BUPIVACAINE-EPINEPHRINE 0.25% -1:200000 IJ SOLN
INTRAMUSCULAR | Status: DC | PRN
  Administered 2012-09-10: 10 mL

## 2012-09-10 MED ORDER — ONDANSETRON HCL 4 MG/2ML IJ SOLN: 4.0000 mg | INTRAMUSCULAR | Status: DC | PRN

## 2012-09-10 MED ORDER — OXYCODONE HCL 5 MG PO TABS: 5.0000 mg | ORAL_TABLET | Freq: Once | ORAL | Status: DC | PRN

## 2012-09-10 MED ORDER — LACTATED RINGERS IV SOLN
INTRAVENOUS | Status: DC
  Administered 2012-09-10: 10 mL/h via INTRAVENOUS

## 2012-09-10 MED ORDER — SODIUM CHLORIDE 0.9 % IJ SOLN: 3.0000 mL | INTRAMUSCULAR | Status: DC | PRN

## 2012-09-10 MED ORDER — ARTIFICIAL TEARS OP OINT
TOPICAL_OINTMENT | OPHTHALMIC | Status: DC | PRN
  Administered 2012-09-10: 1 via OPHTHALMIC

## 2012-09-10 MED ORDER — MENTHOL 3 MG MT LOZG: 1.0000 | LOZENGE | OROMUCOSAL | Status: DC | PRN

## 2012-09-10 MED ORDER — MUPIROCIN 2 % EX OINT
TOPICAL_OINTMENT | Freq: Two times a day (BID) | CUTANEOUS | Status: DC
  Administered 2012-09-10 – 2012-09-11 (×3): via NASAL
  Administered 2012-09-12: 1 via NASAL

## 2012-09-10 MED ORDER — HYDROMORPHONE HCL PF 1 MG/ML IJ SOLN: 0.2500 mg | INTRAMUSCULAR | Status: DC | PRN

## 2012-09-10 MED ORDER — LACTATED RINGERS IV SOLN
INTRAVENOUS | Status: DC | PRN
  Administered 2012-09-10 (×2): via INTRAVENOUS

## 2012-09-10 MED ORDER — THROMBIN 20000 UNITS EX SOLR
CUTANEOUS | Status: DC | PRN
  Administered 2012-09-10: 18:00:00 via TOPICAL

## 2012-09-10 MED ORDER — METOPROLOL TARTRATE 25 MG PO TABS
25.0000 mg | ORAL_TABLET | Freq: Two times a day (BID) | ORAL | Status: DC
  Administered 2012-09-11 – 2012-09-12 (×3): 25 mg via ORAL
  Filled 2012-09-10 (×5): qty 1

## 2012-09-10 MED ORDER — MUPIROCIN 2 % EX OINT
TOPICAL_OINTMENT | CUTANEOUS | Status: AC
  Administered 2012-09-10: 1
  Filled 2012-09-10: qty 22

## 2012-09-10 MED ORDER — LOSARTAN POTASSIUM 50 MG PO TABS
75.0000 mg | ORAL_TABLET | Freq: Every evening | ORAL | Status: DC
  Administered 2012-09-10 – 2012-09-11 (×2): 75 mg via ORAL
  Filled 2012-09-10 (×3): qty 1

## 2012-09-10 MED ORDER — METHOCARBAMOL 100 MG/ML IJ SOLN
500.0000 mg | Freq: Four times a day (QID) | INTRAVENOUS | Status: DC | PRN
  Filled 2012-09-10: qty 5

## 2012-09-10 MED ORDER — LACTATED RINGERS IV SOLN
INTRAVENOUS | Status: DC
  Administered 2012-09-10 – 2012-09-11 (×2): via INTRAVENOUS

## 2012-09-10 MED ORDER — MORPHINE SULFATE 2 MG/ML IJ SOLN
1.0000 mg | INTRAMUSCULAR | Status: DC | PRN
  Administered 2012-09-10 – 2012-09-11 (×2): 2 mg via INTRAVENOUS
  Filled 2012-09-10 (×2): qty 1

## 2012-09-10 MED ORDER — GLYCOPYRROLATE 0.2 MG/ML IJ SOLN
INTRAMUSCULAR | Status: DC | PRN
  Administered 2012-09-10: 0.6 mg via INTRAVENOUS

## 2012-09-10 MED ORDER — PHENYLEPHRINE HCL 10 MG/ML IJ SOLN
INTRAMUSCULAR | Status: DC | PRN
  Administered 2012-09-10: 80 ug via INTRAVENOUS

## 2012-09-10 MED ORDER — PROPOFOL 10 MG/ML IV BOLUS
INTRAVENOUS | Status: DC | PRN
  Administered 2012-09-10: 100 mg via INTRAVENOUS

## 2012-09-10 MED ORDER — 0.9 % SODIUM CHLORIDE (POUR BTL) OPTIME
TOPICAL | Status: DC | PRN
  Administered 2012-09-10: 1000 mL

## 2012-09-10 SURGICAL SUPPLY — 54 items
BUR EGG ELITE 4.0 (BURR) ×1 IMPLANT
BUR MATCHSTICK NEURO 3.0 LAGG (BURR) IMPLANT
CANISTER SUCTION 2500CC (MISCELLANEOUS) ×2 IMPLANT
CLOTH BEACON ORANGE TIMEOUT ST (SAFETY) ×2 IMPLANT
CLSR STERI-STRIP ANTIMIC 1/2X4 (GAUZE/BANDAGES/DRESSINGS) ×2 IMPLANT
CORDS BIPOLAR (ELECTRODE) ×2 IMPLANT
COVER SURGICAL LIGHT HANDLE (MISCELLANEOUS) ×2 IMPLANT
DRAIN CHANNEL 15F RND FF W/TCR (WOUND CARE) ×1 IMPLANT
DRAPE C-ARM 42X72 X-RAY (DRAPES) ×2 IMPLANT
DRAPE INCISE IOBAN 66X45 STRL (DRAPES) ×2 IMPLANT
DRAPE POUCH INSTRU U-SHP 10X18 (DRAPES) ×2 IMPLANT
DRAPE SURG 17X23 STRL (DRAPES) ×2 IMPLANT
DRAPE U-SHAPE 47X51 STRL (DRAPES) ×2 IMPLANT
DRSG MEPILEX BORDER 4X8 (GAUZE/BANDAGES/DRESSINGS) ×2 IMPLANT
DURAPREP 26ML APPLICATOR (WOUND CARE) ×2 IMPLANT
ELECT BLADE 4.0 EZ CLEAN MEGAD (MISCELLANEOUS) ×2
ELECT CAUTERY BLADE 6.4 (BLADE) ×2 IMPLANT
ELECT REM PT RETURN 9FT ADLT (ELECTROSURGICAL) ×2
ELECTRODE BLDE 4.0 EZ CLN MEGD (MISCELLANEOUS) ×1 IMPLANT
ELECTRODE REM PT RTRN 9FT ADLT (ELECTROSURGICAL) ×1 IMPLANT
EVACUATOR 1/8 PVC DRAIN (DRAIN) IMPLANT
EVACUATOR SILICONE 100CC (DRAIN) ×1 IMPLANT
GLOVE BIOGEL PI IND STRL 8.5 (GLOVE) ×1 IMPLANT
GLOVE BIOGEL PI INDICATOR 8.5 (GLOVE) ×1
GLOVE ECLIPSE 8.5 STRL (GLOVE) ×3 IMPLANT
GOWN PREVENTION PLUS XXLARGE (GOWN DISPOSABLE) ×2 IMPLANT
GOWN STRL NON-REIN LRG LVL3 (GOWN DISPOSABLE) ×4 IMPLANT
GOWN STRL REIN XL XLG (GOWN DISPOSABLE) ×4 IMPLANT
KIT BASIN OR (CUSTOM PROCEDURE TRAY) ×2 IMPLANT
KIT ROOM TURNOVER OR (KITS) ×2 IMPLANT
NEEDLE 22X1 1/2 (OR ONLY) (NEEDLE) ×2 IMPLANT
NEEDLE SPNL 18GX3.5 QUINCKE PK (NEEDLE) ×4 IMPLANT
NS IRRIG 1000ML POUR BTL (IV SOLUTION) ×2 IMPLANT
PACK LAMINECTOMY ORTHO (CUSTOM PROCEDURE TRAY) ×2 IMPLANT
PACK UNIVERSAL I (CUSTOM PROCEDURE TRAY) ×2 IMPLANT
PAD ARMBOARD 7.5X6 YLW CONV (MISCELLANEOUS) ×4 IMPLANT
PATTIES SURGICAL .5 X.5 (GAUZE/BANDAGES/DRESSINGS) IMPLANT
PATTIES SURGICAL .5 X1 (DISPOSABLE) ×2 IMPLANT
PUTTY DBX 5CC (Putty) ×1 IMPLANT
SPONGE LAP 4X18 X RAY DECT (DISPOSABLE) ×1 IMPLANT
SPONGE SURGIFOAM ABS GEL 100 (HEMOSTASIS) ×1 IMPLANT
SURGIFLO TRUKIT (HEMOSTASIS) ×1 IMPLANT
SUT MNCRL AB 3-0 PS2 18 (SUTURE) ×2 IMPLANT
SUT VIC AB 0 CT1 27 (SUTURE) ×2
SUT VIC AB 0 CT1 27XBRD ANBCTR (SUTURE) ×1 IMPLANT
SUT VIC AB 1 CTX 36 (SUTURE) ×4
SUT VIC AB 1 CTX36XBRD ANBCTR (SUTURE) ×2 IMPLANT
SUT VIC AB 2-0 CT1 18 (SUTURE) ×2 IMPLANT
SYR BULB IRRIGATION 50ML (SYRINGE) ×2 IMPLANT
SYR CONTROL 10ML LL (SYRINGE) ×2 IMPLANT
TOWEL OR 17X24 6PK STRL BLUE (TOWEL DISPOSABLE) ×2 IMPLANT
TOWEL OR 17X26 10 PK STRL BLUE (TOWEL DISPOSABLE) ×2 IMPLANT
WATER STERILE IRR 1000ML POUR (IV SOLUTION) ×3 IMPLANT
YANKAUER SUCT BULB TIP NO VENT (SUCTIONS) ×2 IMPLANT

## 2012-09-10 NOTE — Brief Op Note (Signed)
09/10/2012  7:55 PM  PATIENT:  Henry [name]  79 y.o. male  PRE-OPERATIVE DIAGNOSIS:  SPINAL STENOSIS  POST-OPERATIVE DIAGNOSIS:  SPINAL STENOSIS  PROCEDURE:  Procedure(s): LUMBAR DECOMPRESSION,  IN SITU FUSION LUMBAR 4-5 (N/A)  SURGEON:  Surgeon(s) and Role:    * Melina Schools, MD - Primary  PHYSICIAN ASSISTANT:   ASSISTANTS: none   ANESTHESIA:   general  EBL:  Total I/O In: 500 [I.V.:500] Out: 100 [Blood:100]  BLOOD ADMINISTERED:none  DRAINS: none   LOCAL MEDICATIONS USED:  MARCAINE     SPECIMEN:  No Specimen  DISPOSITION OF SPECIMEN:  N/A  COUNTS:  YES  TOURNIQUET:  * No tourniquets in log *  DICTATION: .Other Dictation: Dictation Number [id number]  PLAN OF CARE: Admit for overnight observation  PATIENT DISPOSITION:  PACU - hemodynamically stable.

## 2012-09-10 NOTE — Transfer of Care (Signed)
Immediate Anesthesia Transfer of Care Note  Patient: Henry [name]  Procedure(s) Performed: Procedure(s): LUMBAR DECOMPRESSION,  IN SITU FUSION LUMBAR 4-5 (N/A)  Patient Location: PACU  Anesthesia Type:General  Level of Consciousness: awake, oriented, patient cooperative and responds to stimulation  Airway & Oxygen Therapy: Patient Spontanous Breathing and Patient connected to nasal cannula oxygen  Post-op Assessment: Report given to PACU RN, Post -op Vital signs reviewed and stable, Patient moving all extremities and Patient moving all extremities X 4  Post vital signs: Reviewed and stable  Complications: No apparent anesthesia complications

## 2012-09-10 NOTE — Anesthesia Preprocedure Evaluation (Addendum)
Anesthesia Evaluation  Patient identified by MRN, date of birth, ID band Patient awake    Reviewed: Allergy & Precautions, H&P , NPO status , Patient's Chart, lab work & pertinent test results  History of Anesthesia Complications (+) AWARENESS UNDER ANESTHESIA  Airway Mallampati: I TM Distance: >3 FB Neck ROM: full    Dental   Pulmonary          Cardiovascular hypertension, + CAD and + Peripheral Vascular Disease Rhythm:regular Rate:Normal     Neuro/Psych    GI/Hepatic   Endo/Other  diabetes, Type 2, Oral Hypoglycemic Agents  Renal/GU Renal Insufficiency and CRFRenal disease     Musculoskeletal   Abdominal   Peds  Hematology   Anesthesia Other Findings   Reproductive/Obstetrics                           Anesthesia Physical Anesthesia Plan  ASA: III  Anesthesia Plan: General   Post-op Pain Management:    Induction: Intravenous  Airway Management Planned: Oral ETT  Additional Equipment:   Intra-op Plan:   Post-operative Plan: Extubation in OR  Informed Consent: I have reviewed the patients History and Physical, chart, labs and discussed the procedure including the risks, benefits and alternatives for the proposed anesthesia with the patient or authorized representative who has indicated his/her understanding and acceptance.     Plan Discussed with: CRNA and Surgeon  Anesthesia Plan Comments:        Anesthesia Quick Evaluation

## 2012-09-10 NOTE — Anesthesia Procedure Notes (Addendum)
Procedure Name: Intubation Date/Time: 09/10/2012 5:37 PM Performed by: Jacquiline Doe A Pre-anesthesia Checklist: Patient identified, Timeout performed, Emergency Drugs available, Suction available and Patient being monitored Patient Re-evaluated:Patient Re-evaluated prior to inductionPreoxygenation: Pre-oxygenation with 100% oxygen Intubation Type: IV induction and Cricoid Pressure applied Ventilation: Mask ventilation without difficulty and Oral airway inserted - appropriate to patient size Laryngoscope Size: Mac and 4 Grade View: Grade I Tube type: Oral Tube size: 7.5 mm Number of attempts: 1 Airway Equipment and Method: Stylet and LTA kit utilized Placement Confirmation: ETT inserted through vocal cords under direct vision,  breath sounds checked- equal and bilateral and positive ETCO2 Secured at: 22 cm Tube secured with: Tape Dental Injury: Teeth and Oropharynx as per pre-operative assessment

## 2012-09-10 NOTE — Anesthesia Postprocedure Evaluation (Signed)
  Anesthesia Post-op Note  Patient: Henry [name]  Procedure(s) Performed: Procedure(s): LUMBAR DECOMPRESSION,  IN SITU FUSION LUMBAR 4-5 (N/A)  Patient Location: PACU  Anesthesia Type:General  Level of Consciousness: awake  Airway and Oxygen Therapy: Patient Spontanous Breathing  Post-op Pain: mild  Post-op Assessment: Post-op Vital signs reviewed, Patient's Cardiovascular Status Stable, Respiratory Function Stable, Patent Airway, No signs of Nausea or vomiting and Pain level controlled  Post-op Vital Signs: stable  Complications: No apparent anesthesia complications

## 2012-09-10 NOTE — Preoperative (Signed)
Beta Blockers   Reason not to administer Beta Blockers:Metoprolol 25 mg po@0645hrs  on07/16/2014

## 2012-09-11 LAB — HEMOGLOBIN A1C
Hgb A1c MFr Bld: 6.1 % — ABNORMAL HIGH (ref <5.7)
Mean Plasma Glucose: 128 mg/dL — ABNORMAL HIGH (ref <117)

## 2012-09-11 LAB — GLUCOSE, CAPILLARY: Glucose-Capillary: 135 mg/dL — ABNORMAL HIGH (ref 70–99)

## 2012-09-11 MED ORDER — INSULIN ASPART 100 UNIT/ML ~~LOC~~ SOLN: 0.0000 [IU] | Freq: Every day | SUBCUTANEOUS | Status: DC

## 2012-09-11 MED ORDER — TRAMADOL HCL 50 MG PO TABS
50.0000 mg | ORAL_TABLET | Freq: Four times a day (QID) | ORAL | Status: DC | PRN
  Administered 2012-09-11 – 2012-09-12 (×2): 50 mg via ORAL
  Filled 2012-09-11: qty 1

## 2012-09-11 MED ORDER — INSULIN ASPART 100 UNIT/ML ~~LOC~~ SOLN
0.0000 [IU] | Freq: Three times a day (TID) | SUBCUTANEOUS | Status: DC
  Administered 2012-09-11: 5 [IU] via SUBCUTANEOUS
  Administered 2012-09-12: 2 [IU] via SUBCUTANEOUS

## 2012-09-11 MED ORDER — INSULIN ASPART 100 UNIT/ML ~~LOC~~ SOLN: 0.0000 [IU] | Freq: Three times a day (TID) | SUBCUTANEOUS | Status: DC

## 2012-09-11 NOTE — Evaluation (Signed)
Physical Therapy Evaluation Patient Details Name: Henry [name] MRN: [id number] DOB: 1925/01/04 Today's Date: 09/11/2012 Time: [id number] PT Time Calculation (min): 24 min  PT Assessment / Plan / Recommendation History of Present Illness  Pt adm for L4-5 decompression and fusion due to several month h/o back and RLE pain.Pt with h/o CAD, CABG, HTN, and PVD.  Clinical Impression  Patient is s/p L4-5 decompression/fusion surgery resulting in the deficits listed below (see PT Problem List). 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 Patient will benefit from skilled PT to increase their independence and safety with mobility (while adhering to their precautions) to allow discharge home with HHPT.      PT Assessment  Patient needs continued PT services    Follow Up Recommendations  Home health PT (home safety eval)    Does the patient have the potential to tolerate intense rehabilitation      Barriers to Discharge        Equipment Recommendations  None recommended by PT    Recommendations for Other Services     Frequency Min 5X/week    Precautions / Restrictions Precautions Precautions: Back Precaution Booklet Issued: Yes (comment) Required Braces or Orthoses: Spinal Brace Spinal Brace: Lumbar corset;Applied in sitting position   Pertinent Vitals/Pain 3/10 back pain; no leg pain; patient repositioned for comfort      Mobility  Bed Mobility Bed Mobility: Rolling Right;Right Sidelying to Sit;Sit to Sidelying Right;Sitting - Scoot to Edge of Bed Rolling Right: 5: Supervision Right Sidelying to Sit: 5: Supervision Sitting - Scoot to Edge of Bed: 5: Supervision Sit to Sidelying Right: 4: Min guard Details for Bed Mobility Assistance: vc for technique to maintain back precautions; near need for assist to lift legs onto bed Transfers Transfers: Sit to Stand;Stand to Sit Sit to Stand: 5: Supervision Stand to Sit: 5:  Supervision Ambulation/Gait Ambulation/Gait Assistance: 4: Min guard Ambulation Distance (Feet): 200 Feet Assistive device: None Ambulation/Gait Assistance Details: good upright posture Gait Pattern: Step-through pattern;Decreased stride length Gait velocity: decr Stairs: No (1 stairwell closed; 1 alarmed; will get alarm off by RN 7/18)    Exercises     PT Diagnosis: Difficulty walking;Acute pain  PT Problem List: Decreased mobility;Decreased knowledge of use of DME;Decreased knowledge of precautions;Pain PT Treatment Interventions: Gait training;Stair training;Functional mobility training;Therapeutic activities;Patient/family education     PT Goals(Current goals can be found in the care plan section) Acute Rehab PT Goals Patient Stated Goal: be able to sleep in his bedroom upstairs PT Goal Formulation: With patient Time For Goal Achievement: 09/12/12 Potential to Achieve Goals: Good  Visit Information  Last PT Received On: 09/11/12 Assistance Needed: +1 History of Present Illness: Pt adm for L4-5 decompression and fusion due to several month h/o back and RLE pain.Pt with h/o CAD, CABG, HTN, and PVD.       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 Pt's children will be providing 24/7 assist for next several weeks. Prior Function Level of Independence: Independent Communication Communication: No difficulties    Cognition  Cognition Arousal/Alertness: Awake/alert Behavior  During Therapy: WFL for  tasks assessed/performed Overall Cognitive Status: Within Functional Limits for tasks assessed    Extremity/Trunk Assessment Upper Extremity Assessment Upper Extremity Assessment: Defer to OT evaluation Lower Extremity Assessment Lower Extremity Assessment: Overall WFL for tasks assessed   Balance    End of Session PT - End of Session Equipment Utilized During Treatment: Gait belt;Back brace Activity Tolerance: Patient tolerated treatment well Patient left: with call bell/phone within reach;with family/visitor present;Other (comment) (on toilet in bathroom) Nurse Communication: Mobility status  GP Functional Assessment Tool Used: clinical observation Functional Limitation: Mobility: Walking and moving around Mobility: Walking and Moving Around Current Status [id number]): At least 1 percent but less than 20 percent impaired, limited or restricted Mobility: Walking and Moving Around Goal Status [phone]): At least 1 percent but less than 20 percent impaired, limited or restricted   Abcde Oneil 09/11/2012, 11:02 AM Pager [phone]

## 2012-09-11 NOTE — Progress Notes (Signed)
    Subjective: Procedure(s) (LRB): LUMBAR DECOMPRESSION,  IN SITU FUSION LUMBAR 4-5 (N/A) 1 Day Post-Op  Patient reports pain as 4 on 0-10 scale.  Reports decreased leg pain reports incisional back pain   Positive void Negative bowel movement Positive flatus Negative chest pain or shortness of breath  Objective: Vital signs in last 24 hours: Temp:  [96.2 F (35.7 C)-97.7 F (36.5 C)] 97.4 F (36.3 C) (07/17 0329) Pulse Rate:  [48-68] 68 (07/17 0329) Resp:  [14-18] 16 (07/17 0329) BP: (153-189)/(47-85) 189/85 mmHg (07/17 0329) SpO2:  [96 %-100 %] 99 % (07/17 0329)  Intake/Output from previous day: 07/16 0701 - 07/17 0700 In: 2250 [I.V.:2250] Out: 200 [Blood:200]  Labs:  Recent Labs  09/10/12 1347  WBC 8.1  RBC 3.84*  HCT 33.0*  PLT 214    Recent Labs  09/10/12 1347  NA 137  K 4.2  CL 100  CO2 26  BUN 22  CREATININE 1.63*  GLUCOSE 108*  CALCIUM 9.9   No results found for this basename: LABPT, INR,  in the last 72 hours  Physical Exam: Neurologically intact ABD soft Intact pulses distally Dorsiflexion/Plantar flexion intact Incision: dressing C/D/I and no drainage Compartment soft  Assessment/Plan: Patient stable  xrays N/A Continue mobilization with physical therapy Continue care  Advance diet Up with therapy Discharge home with home health Friday Continue care  Melina Schools, MD [place] [phone]

## 2012-09-11 NOTE — Progress Notes (Signed)
Occupational Therapy Evaluation  09/11/12 0800  OT Visit Information  Last OT Received On 09/11/12  Assistance Needed +1  Precautions  Precautions Back  Required Braces or Orthoses Spinal Brace  Spinal Brace Lumbar corset  Home Living  Family/patient expects to be discharged to: Private residence  Living Arrangements Spouse/significant other  Available Help at Discharge Family;Available 24 hours/day  Type of Home House  Home Access Stairs to enter  Entrance Stairs-Number of Steps 1  Home Layout Two level;Bed/bath upstairs  Alternate Level Stairs-Number of Steps flight  Alternate Level Stairs-Rails Right  Bathroom Shower/Tub Tub/shower unit  Research officer, trade [employer] - single point;Shower seat  Additional Comments 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 Pt's children will be providing 24/7 assist for next several weeks.  Prior Function  Level of Independence Independent  Communication  Communication No difficulties  Cognition  Arousal/Alertness Awake/alert  Behavior During Therapy WFL for tasks assessed/performed  Overall Cognitive Status Within Functional Limits for tasks assessed  Upper Extremity Assessment  Upper Extremity Assessment Overall WFL for tasks assessed  ADL  Eating/Feeding Performed;Independent  Where Assessed - Eating/Feeding Chair  Upper Body Dressing Performed;Set up  Where Assessed - Upper Body Dressing Unsupported sitting  Toilet Transfer Simulated;Min guard  Toilet Transfer Method Sit to stand  Toilet Transfer Equipment (chair)  Equipment Used Back brace  Transfers/Ambulation Related to ADLs Min guard for safety during ambulation. Pt with occasional lateral sway but able to self correct.  ADL Comments Educated pt on 3/3 back precautions.  Limited session due to arrival of pt's meal tray and pt requesting to eat breakfast.    Bed Mobility  Bed Mobility Not assessed (pt up in chair)  Transfers  Transfers Sit to Stand;Stand  to Sit  Sit to Stand 5: Supervision;From chair/3-in-1;With upper extremity assist;With armrests  Stand to Sit 5: Supervision;To chair/3-in-1;With armrests;With upper extremity assist  OT - End of Session  Equipment Utilized During Treatment Back brace  Activity Tolerance Patient tolerated treatment well  Patient left in chair;with call bell/phone within reach;with family/visitor present  Nurse Communication Mobility status  OT Assessment  OT Recommendation/Assessment Patient needs continued OT Services  OT Problem List Decreased activity tolerance;Impaired balance (sitting and/or standing);Decreased knowledge of use of DME or AE;Pain  OT Therapy Diagnosis  Generalized weakness;Acute pain  OT Plan  OT Frequency Min 2X/week  OT Treatment/Interventions Self-care/ADL training;DME and/or AE instruction;Therapeutic activities;Patient/family education;Balance training  OT Recommendation  Follow Up Recommendations Supervision/Assistance - 24 hour;No OT follow up  OT Equipment (TBD)  Individuals Consulted  Consulted and Agree with Results and Recommendations Patient  Acute Rehab OT Goals  Patient Stated Goal to return home  OT Goal Formulation With patient  Time For Goal Achievement 09/18/12  Potential to Achieve Goals Good  OT Time Calculation  OT Start Time 0836  OT Stop Time [id number]  OT Time Calculation (min) 21 min  OT G-codes **NOT FOR INPATIENT CLASS**  Functional Assessment Tool Used clinical judgement  Functional Limitation Self care  Self Care Current Status [id number]) CI  Self Care Goal Status [id number]) [place]                   09/11/2012 Darrol Jump OTR/L Pager [phone] Office [phone]

## 2012-09-12 LAB — GLUCOSE, CAPILLARY: Glucose-Capillary: 98 mg/dL (ref 70–99)

## 2012-09-12 MED ORDER — ONDANSETRON HCL 4 MG PO TABS: 4.0000 mg | ORAL_TABLET | Freq: Three times a day (TID) | ORAL | Status: DC | PRN

## 2012-09-12 MED ORDER — POLYETHYLENE GLYCOL 3350 17 GM/SCOOP PO POWD: 17.0000 g | Freq: Every day | ORAL | Status: DC

## 2012-09-12 MED ORDER — METHOCARBAMOL 500 MG PO TABS: 500.0000 mg | ORAL_TABLET | Freq: Three times a day (TID) | ORAL | Status: DC | PRN

## 2012-09-12 MED ORDER — DOCUSATE SODIUM 100 MG PO CAPS: 100.0000 mg | ORAL_CAPSULE | Freq: Three times a day (TID) | ORAL | Status: DC | PRN

## 2012-09-12 MED ORDER — TRAMADOL HCL 50 MG PO TABS: 50.0000 mg | ORAL_TABLET | Freq: Four times a day (QID) | ORAL | Status: DC | PRN

## 2012-09-12 NOTE — Progress Notes (Signed)
Occupational Therapy Treatment Patient Details Name: Henry [name] MRN: [id number] DOB: [birth year] Today's Date: 09/12/2012 Time: [id number] OT Time Calculation (min): 32 min  OT Assessment / Plan / Recommendation  OT comments  Pt making good progress and has met OT goals.  No further acute OT needs. Will sign off. Pt anticipate d/c home today.  Follow Up Recommendations  Supervision/Assistance - 24 hour;No OT follow up    Barriers to Discharge       Equipment Recommendations  None recommended by OT    Recommendations for Other Services    Frequency Min 2X/week   Progress towards OT Goals Progress towards OT goals: Goals met/education completed, patient discharged from [place] Discharge plan remains appropriate;All goals met and education completed, patient discharged from OT services    Precautions / Restrictions Precautions Precautions: Back Precaution Booklet Issued: Yes (comment) Precaution Comments: Pt able to recall 3/3 back precautions. Required Braces or Orthoses: Spinal Brace Spinal Brace: Lumbar corset;Applied in sitting position   Pertinent Vitals/Pain See vitals    ADL  Lower Body Bathing: Simulated;Modified independent Where Assessed - Lower Body Bathing: Unsupported sit to stand Lower Body Dressing: Modified independent;Performed Where Assessed - Lower Body Dressing: Unsupported sit to stand Toilet Transfer: Simulated;Modified independent Toilet Transfer Method: Sit to Loss adjuster, chartered:  (bed) Toileting - Clothing Manipulation and Hygiene: Simulated;Modified independent Where Assessed - Toileting Clothing Manipulation and Hygiene: Standing Tub/Shower Transfer: Recruitment consultant Method: Ambulating Equipment Used: Back brace Transfers/Ambulation Related to ADLs: mod I ambulating in room and hall.  ADL Comments: Pt demo'd ability to cross ankles over knees in order to don/doff socks.  Educated pt on using this  method to perform LB bathing and donning/doffing LB clothing. Pt verbalized understanding.  Educated and demo'd tub transfer technique to pt during simulation instructing on side stepping over tub ledge at home. Pt able to return demonstration at supervision level.  Recommended use of reacher for ease and safety in retrieving household items and for ADLs if needed.      OT Diagnosis:    OT Problem List:   OT Treatment Interventions:     OT Goals(current goals can now be found in the care plan section) Acute Rehab OT Goals Patient Stated Goal: be able to sleep in his bedroom upstairs OT Goal Formulation: With patient Time For Goal Achievement: 09/18/12 Potential to Achieve Goals: Good ADL Goals Pt Will Perform Lower Body Bathing: with modified independence;sit to/from stand Pt Will Perform Lower Body Dressing: with modified independence;sit to/from stand Pt Will Transfer to Toilet: with modified independence;ambulating;regular height toilet Pt Will Perform Tub/Shower Transfer: Tub transfer;with modified independence;shower seat;ambulating Additional ADL Goal #1: Pt will perform bed mobility at mod I level as precursor for EOB ADLs.  Visit Information  Last OT Received On: 09/12/12 Assistance Needed: +1 History of Present Illness: Pt adm for L4-5 decompression and fusion due to several month h/o back and RLE pain.Pt with h/o CAD, CABG, HTN, and PVD.    Subjective Data      Prior Functioning       Cognition  Cognition Arousal/Alertness: Awake/alert Behavior During Therapy: WFL for tasks assessed/performed Overall Cognitive Status: Within Functional Limits for tasks assessed    Mobility  Bed Mobility Bed Mobility: Rolling Right;Right Sidelying to Sit;Sitting - Scoot to [employer] of Bed;Sit to Sidelying Right Rolling Right: 6: Modified independent (Device/Increase time) Right Sidelying to Sit: 6: Modified independent (Device/Increase time) Sitting - Scoot to Edge of Bed: 6: Modified  independent (Device/Increase time) Sit to Sidelying Right: 6: Modified independent (Device/Increase time) Details for Bed Mobility Assistance: Inr time for sequencing of log roll technique and due to feeling stiff/sore. Transfers Transfers: Stand to Sit;Sit to Stand Sit to Stand: 6: Modified independent (Device/Increase time);From bed;With upper extremity assist Stand to Sit: 6: Modified independent (Device/Increase time);To bed;With upper extremity assist    Exercises      Balance     End of Session OT - End of Session Equipment Utilized During Treatment: Back brace Activity Tolerance: Patient tolerated treatment well Patient left: in bed;with call bell/phone within reach Nurse Communication: Mobility status  GO Functional Assessment Tool Used: clinical judgement Functional Limitation: Self care Self Care Current Status [id number]): 0 percent impaired, limited or restricted Self Care Goal Status [id number]): 0 percent impaired, limited or restricted Self Care Discharge Status [phone]): 0 percent impaired, limited or restricted   09/12/2012 Darrol Jump OTR/L Pager [phone] Office [phone]  Ge, [place] 09/12/2012, 8:47 AM

## 2012-09-12 NOTE — Progress Notes (Signed)
Pt. Alert and oriented,follows simple instructions, denies pain. Incision area without swelling, redness or S/S of infection. Voiding adequate clear yellow urine. Moving all extremities well and vitals stable and documented. Lumbar surgery notes instructions given to patient and family member for home safety and precautions. Pt. and family stated understanding of instructions given.  

## 2012-09-12 NOTE — Progress Notes (Signed)
    Subjective: Procedure(s) (LRB): LUMBAR DECOMPRESSION,  IN SITU FUSION LUMBAR 4-5 (N/A) 2 Days Post-Op  Patient reports pain as 2 on 0-10 scale.  Reports decreased leg pain reports incisional back pain   Positive void Positive bowel movement Positive flatus Negative chest pain or shortness of breath  Objective: Vital signs in last 24 hours: Temp:  [97.4 F (36.3 C)-98.7 F (37.1 C)] 98 F (36.7 C) (07/18 0326) Pulse Rate:  [62-76] 62 (07/18 0326) Resp:  [16-20] 16 (07/18 0326) BP: (110-159)/(57-70) 122/63 mmHg (07/18 0326) SpO2:  [95 %-98 %] 96 % (07/18 0326)  Intake/Output from previous day: 07/17 0701 - 07/18 0700 In: 720 [P.O.:720] Out: -   Labs:  Recent Labs  09/10/12 1347  WBC 8.1  RBC 3.84*  HCT 33.0*  PLT 214    Recent Labs  09/10/12 1347  NA 137  K 4.2  CL 100  CO2 26  BUN 22  CREATININE 1.63*  GLUCOSE 108*  CALCIUM 9.9   No results found for this basename: LABPT, INR,  in the last 72 hours  Physical Exam: Neurologically intact ABD soft Intact pulses distally Incision: dressing C/D/I and no drainage Compartment soft  Assessment/Plan: Patient stable  xrays n/a Continue mobilization with physical therapy Continue care  Advance diet Up with therapy Discharge home with home health  Melina Schools, MD [place] [phone]

## 2012-09-12 NOTE — Progress Notes (Signed)
Physical Therapy Treatment Patient Details Name: ZEPPLIN MISAK MRN: [id number] DOB: [birth year] Today's Date: 09/12/2012 Time: [id number] PT Time Calculation (min): 8 min  PT Assessment / Plan / Recommendation  PT Comments   Pt doing very well and ready for dc home from PT standpoint.  Follow Up Recommendations  No PT follow up     Does the patient have the potential to tolerate intense rehabilitation     Barriers to Discharge        Equipment Recommendations  None recommended by PT    Recommendations for Other Services    Frequency     Progress towards PT Goals Progress towards PT goals: Goals met/education completed, patient discharged from PT  Plan      Precautions / Restrictions Precautions Precautions: Back Precaution Booklet Issued: Yes (comment) Precaution Comments: Pt able to recall 3/3 back precautions. Required Braces or Orthoses: Spinal Brace Spinal Brace: Lumbar corset;Applied in sitting position   Pertinent Vitals/Pain No c/o's    Mobility  Bed Mobility Bed Mobility: Rolling Right;Right Sidelying to Sit;Sitting - Scoot to [employer] of Bed;Sit to Sidelying Right Rolling Right: 6: Modified independent (Device/Increase time) Right Sidelying to Sit: 6: Modified independent (Device/Increase time) Sitting - Scoot to Edge of Bed: 6: Modified independent (Device/Increase time) Sit to Sidelying Right: 6: Modified independent (Device/Increase time) Details for Bed Mobility Assistance: Inr time for sequencing of log roll technique and due to feeling stiff/sore. Transfers Sit to Stand: 6: Modified independent (Device/Increase time);With upper extremity assist;With armrests;From chair/3-in-1 Stand to Sit: 6: Modified independent (Device/Increase time);With upper extremity assist;With armrests;To chair/3-in-1 Ambulation/Gait Ambulation/Gait Assistance: 7: Independent Ambulation Distance (Feet): 400 Feet Assistive device: None Gait Pattern: Step-through pattern;Decreased  stride length Stairs: Yes Stairs Assistance: 6: Modified independent (Device/Increase time) Stair Management Technique: One rail Left Number of Stairs: 12    Exercises     PT Diagnosis:    PT Problem List:   PT Treatment Interventions:     PT Goals (current goals can now be found in the care plan section) Acute Rehab PT Goals Patient Stated Goal: be able to sleep in his bedroom upstairs  Visit Information  Last PT Received On: 09/12/12 Assistance Needed: +1 History of Present Illness: Pt adm for L4-5 decompression and fusion due to several month h/o back and RLE pain.Pt with h/o CAD, CABG, HTN, and PVD.    Subjective Data  Patient Stated Goal: be able to sleep in his bedroom upstairs   Cognition  Cognition Arousal/Alertness: Awake/alert Behavior During Therapy: WFL for tasks assessed/performed Overall Cognitive Status: Within Functional Limits for tasks assessed    Balance     End of Session PT - End of Session Equipment Utilized During Treatment: Back brace Activity Tolerance: Patient tolerated treatment well Patient left: in chair;with call bell/phone within reach Nurse Communication: Mobility status   GP Functional Assessment Tool Used: clinical observation Functional Limitation: Mobility: Walking and moving around Mobility: Walking and Moving Around Current Status [id number]): At least 1 percent but less than 20 percent impaired, limited or restricted Mobility: Walking and Moving Around Goal Status [phone]): At least 1 percent but less than 20 percent impaired, limited or restricted Mobility: Walking and Moving Around Discharge Status [phone]): At least 1 percent but less than 20 percent impaired, limited or restricted   [facility] 09/12/2012, 10:33 AM  [place]

## 2012-09-12 NOTE — Discharge Summary (Signed)
Patient ID: Henry [name] MRN: [id number] DOB/AGE: 77-Jul-1926 78 y.o.  Admit date: 09/10/2012 Discharge date: 09/12/2012  Admission Diagnoses:  Active Problems:   * No active hospital problems. *   Discharge Diagnoses:  Active Problems:   * No active hospital problems. *  status post Procedure(s): LUMBAR DECOMPRESSION,  IN SITU FUSION LUMBAR 4-5  Past Medical History  Diagnosis Date  . Hypertension   . CAD of autologous bypass graft   . Carotid artery disease   . PVD (peripheral vascular disease) with claudication   . Hypercholesteremia   . Renal insufficiency   . DJD (degenerative joint disease)   . History of anemia of chronic disease   . Diabetes mellitus without complication     Surgeries: Procedure(s): LUMBAR DECOMPRESSION,  IN SITU FUSION LUMBAR 4-5 on 09/10/2012   Consultants:  none  Discharged Condition: Improved  Hospital Course: Henry [name] is an 79 y.o. male who was admitted 09/10/2012 for operative treatment of <principal problem not specified>. Patient failed conservative treatments (please see the history and physical for the specifics) and had severe unremitting pain that affects sleep, daily activities and work/hobbies. After pre-op clearance, the patient was taken to the operating room on 09/10/2012 and underwent  Procedure(s): LUMBAR DECOMPRESSION,  IN SITU FUSION LUMBAR 4-5.    Patient was given perioperative antibiotics: Anti-infectives   Start     Dose/Rate Route Frequency Ordered Stop   09/11/12 0100  ceFAZolin (ANCEF) IVPB 1 g/50 mL premix     1 g 100 mL/hr over 30 Minutes Intravenous Every 8 hours 09/10/12 2119 09/11/12 0858   09/09/12 1445  ceFAZolin (ANCEF) IVPB 2 g/50 mL premix     2 g 100 mL/hr over 30 Minutes Intravenous 30 min pre-op 09/09/12 1445 09/10/12 1740       Patient was given sequential compression devices and early ambulation to prevent DVT.   Patient benefited maximally from hospital stay and there were no  complications. At the time of discharge, the patient was urinating/moving their bowels without difficulty, tolerating a regular diet, pain is controlled with oral pain medications and they have been cleared by PT/OT.   Recent vital signs: Patient Vitals for the past 24 hrs:  BP Temp Temp src Pulse Resp SpO2  09/12/12 0326 122/63 mmHg 98 F (36.7 C) Oral 62 16 96 %  09/11/12 2326 150/57 mmHg 98.7 F (37.1 C) Oral 65 16 95 %  09/11/12 2225 159/65 mmHg - - 65 - -  09/11/12 1927 135/68 mmHg 98.6 F (37 C) Oral 71 18 97 %  09/11/12 1645 110/70 mmHg 98.4 F (36.9 C) - 64 18 97 %  09/11/12 1244 147/63 mmHg 97.4 F (36.3 C) Oral 76 20 98 %     Recent laboratory studies:  Recent Labs  09/10/12 1347  WBC 8.1  HGB 11.6*  HCT 33.0*  PLT 214  NA 137  K 4.2  CL 100  CO2 26  BUN 22  CREATININE 1.63*  GLUCOSE 108*  CALCIUM 9.9     Discharge Medications:     Medication List    STOP taking these medications       acetaminophen 325 MG tablet  Commonly known as:  TYLENOL      TAKE these medications       accu-chek multiclix lancets  Use as instructed     amLODipine 10 MG tablet  Commonly known as:  NORVASC  Take 10 mg by mouth every evening.  aspirin EC 81 MG tablet  Take 81 mg by mouth every morning.     atorvastatin 20 MG tablet  Commonly known as:  LIPITOR  Take 20 mg by mouth every evening.     CALCIUM 600/VITAMIN D PO  Take 1 tablet by mouth 2 (two) times daily.     CENTRUM SILVER PO  Take 1 tablet by mouth every morning.     PRESERVISION AREDS 2 PO  Take 1 tablet by mouth every evening.     docusate sodium 100 MG capsule  Commonly known as:  COLACE  Take 1 capsule (100 mg total) by mouth 3 (three) times daily as needed for constipation.     ferrous gluconate 325 MG tablet  Commonly known as:  FERGON  Take 325 mg by mouth daily with breakfast.     furosemide 20 MG tablet  Commonly known as:  LASIX  Take 20 mg by mouth daily as needed for fluid or  edema.     glucose blood test strip  Commonly known as:  ACCU-CHEK AVIVA PLUS  Use as instructed     losartan 25 MG tablet  Commonly known as:  COZAAR  Take 75 mg by mouth every evening.     methocarbamol 500 MG tablet  Commonly known as:  ROBAXIN  Take 1 tablet (500 mg total) by mouth 3 (three) times daily as needed.     metoprolol tartrate 25 MG tablet  Commonly known as:  LOPRESSOR  Take 1 tablet (25 mg total) by mouth 2 (two) times daily.     ondansetron 4 MG tablet  Commonly known as:  ZOFRAN  Take 1 tablet (4 mg total) by mouth every 8 (eight) hours as needed for nausea.     polyethylene glycol powder powder  Commonly known as:  GLYCOLAX  Take 17 g by mouth daily.     traMADol 50 MG tablet  Commonly known as:  ULTRAM  Take 1 tablet (50 mg total) by mouth every 6 (six) hours as needed for pain.     VITAMIN C PO  Take 90 mg by mouth every morning. `        Diagnostic Studies: Dg Chest 2 View  08/19/2012   *RADIOLOGY REPORT*  Clinical Data: Former smoker with current history of hypertension, diabetes, and hypercholesterolemia.  Prior CABG.  Stage III chronic kidney disease.  CHEST - 2 VIEW  Comparison: Two-view chest x-ray 06/08/2008, 10/06/1998.  Findings: Cardiac silhouette normal in size.  Thoracic aorta mildly tortuous and atherosclerotic.  Hilar and mediastinal contours otherwise unremarkable.  Emphysematous changes in the upper lobes. Scarring in the lower lobes.  Mild hyperinflation.  Lungs otherwise clear.  No pleural effusions.  Degenerative changes involving the thoracic spine.  IMPRESSION: No acute cardiopulmonary disease.  COPD/emphysema.  Scarring in the lower lobes.   Original Report Authenticated By: Evangeline Dakin, M.D.   Dg Lumbar Spine 2-3 Views  09/10/2012   *RADIOLOGY REPORT*  Clinical Data: L4-L5 decompression and fusion.  LUMBAR SPINE - 2-3 VIEW,DG C-ARM 1-60 MIN  Comparison: Lumbar spine radiographs 09/10/2012.  Findings: Technique:  C-arm  fluoroscopic images were obtained intraoperatively and submitted for postoperative interpretation. Please see the performing provider's procedural report for the fluoroscopy time utilized.  Two spot lateral fluoroscopic images of the lower lumbar spine demonstrate needle localization on the initial image with needles between the L2 and L3 spinous processes and overlying the L4 spinous process.  On the second image, there is skin spreaders posteriorly at  L4-L5 with surgical instruments directed over the L4 and L5 pedicles.  IMPRESSION: Intraoperative localization views as described.   Original Report Authenticated By: Richardean Sale, M.D.   Dg Lumbar Spine 2-3 Views  09/10/2012   *RADIOLOGY REPORT*  Clinical Data: Preop lumbar spine fusion.  LUMBAR SPINE - 2-3 VIEW  Comparison: None available.  Findings: Two views lumbar spine are submitted.  The lumbar vertebral bodies are labeled on both views.  Degenerative facet changes are most evident at L4-5 L5-S1.  There is slight anterolisthesis at L4-5.  Chronic loss of disc height is present at L5-S1.  Atherosclerotic calcifications are present in the aorta without evidence for aneurysm.  IMPRESSION:  1.  Spondylosis of lower lumbar spine is most evident at L4-5 and L5-S1. 2.  Slight anterolisthesis at L4-5 and chronic loss of disc height at L5-S1. 3.  Atherosclerosis without evidence for aneurysm.   Original Report Authenticated By: San Morelle, M.D.   Dg C-arm 1-60 Min  09/10/2012   *RADIOLOGY REPORT*  Clinical Data: L4-L5 decompression and fusion.  LUMBAR SPINE - 2-3 VIEW,DG C-ARM 1-60 MIN  Comparison: Lumbar spine radiographs 09/10/2012.  Findings: Technique:  C-arm fluoroscopic images were obtained intraoperatively and submitted for postoperative interpretation. Please see the performing provider's procedural report for the fluoroscopy time utilized.  Two spot lateral fluoroscopic images of the lower lumbar spine demonstrate needle localization on the  initial image with needles between the L2 and L3 spinous processes and overlying the L4 spinous process.  On the second image, there is skin spreaders posteriorly at L4-L5 with surgical instruments directed over the L4 and L5 pedicles.  IMPRESSION: Intraoperative localization views as described.   Original Report Authenticated By: Richardean Sale, M.D.         Future Appointments Provider Department Dept Phone   02/10/2013 11:00 AM Noralee Space, MD [facility] [phone]      Follow-up Information   Follow up with Dahlia Bailiff, MD In 2 weeks.   Contact information:   [address] [place] [address] [phone]       Discharge Plan:  discharge to home  Disposition: stable    Signed: Melina Schools D for Dr. Melina Schools [facility] [phone] 09/12/2012, 8:17 AM

## 2012-09-12 NOTE — Care Management Note (Signed)
    Page 1 of 1   09/12/2012     7:44:07 AM   CARE MANAGEMENT NOTE 09/12/2012  Patient:  Henry [name], Henry [name]   Account Number:  [id number]  Date Initiated:  09/12/2012  Documentation initiated by:  Fuller Plan  Subjective/Objective Assessment:   admitted postop laminectomy     Action/Plan:   PT/OT evals-recommended HHPT   Anticipated DC Date:  09/12/2012   Anticipated DC Plan:  [place]  CM consult      Choice offered to / List presented to:  C-1 Patient        [place] arranged  [facility]      [place].   Status of service:  Completed, signed off Medicare Important Message given?   (If response is "NO", the following Medicare IM given date fields will be blank) Date Medicare IM given:   Date Additional Medicare IM given:    Discharge Disposition:  [place]  Per UR Regulation:    If discussed at Long Length of Stay Meetings, dates discussed:    Comments:  09/12/12 Spoke with patient, his wife and his son Kazuto about [place]. They chose [facility]. Contacted Haddy Mullinax with [facility] and set up HHPT. No equipment needs identified. Fuller Plan RN, BSN, CCM

## 2012-09-15 ENCOUNTER — Encounter (HOSPITAL_COMMUNITY): Payer: Self-pay | Admitting: Orthopedic Surgery

## 2012-09-15 DIAGNOSIS — M545 Low back pain: Secondary | ICD-10-CM | POA: Diagnosis not present

## 2012-09-15 DIAGNOSIS — E119 Type 2 diabetes mellitus without complications: Secondary | ICD-10-CM | POA: Diagnosis not present

## 2012-09-15 DIAGNOSIS — IMO0001 Reserved for inherently not codable concepts without codable children: Secondary | ICD-10-CM | POA: Diagnosis not present

## 2012-09-15 DIAGNOSIS — M48061 Spinal stenosis, lumbar region without neurogenic claudication: Secondary | ICD-10-CM | POA: Diagnosis not present

## 2012-09-15 DIAGNOSIS — Z4789 Encounter for other orthopedic aftercare: Secondary | ICD-10-CM | POA: Diagnosis not present

## 2012-09-22 ENCOUNTER — Encounter: Payer: Self-pay | Admitting: Cardiology

## 2012-09-23 DIAGNOSIS — Z981 Arthrodesis status: Secondary | ICD-10-CM | POA: Diagnosis not present

## 2012-09-23 NOTE — Op Note (Signed)
NAME:  Henry [name], CARROL NO.:  [id number]  MEDICAL RECORD NO.:  [id number]  LOCATION:                               FACILITY:  [facility]  PHYSICIAN:  Dahlia Bailiff, MD    DATE OF BIRTH:  Oct 29, 1924  DATE OF PROCEDURE:  09/10/2012 DATE OF DISCHARGE:  09/12/2012                              OPERATIVE REPORT   PREOPERATIVE DIAGNOSIS:  Lumbar spinal stenosis with degenerative spondylolisthesis L4-5.  POSTOPERATIVE DIAGNOSIS:  Lumbar spinal stenosis with degenerative spondylolisthesis L4-5.  OPERATIVE PROCEDURE:  L4-5 lumbar decompression with in situ fusion.  COMPLICATIONS:  None.  CONDITION:  Stable.  BONE GRAFT USED:  The local bone graft from decompression autograft plus DBX.  HISTORY:  This is a very pleasant elderly gentleman who has been having severe debilitating back, buttock, and bilateral leg pain, right worse than the left.  Imaging studies confirmed the diagnosis.  Because of failed conservative management, he elected to proceed with surgery.  All appropriate risks, benefits, and alternatives were discussed and consent was obtained.  OPERATIVE NOTE:  The patient was brought to the operating room, placed supine on the operating table.  After successful induction of general anesthesia and endotracheal intubation, TEDs, SCDs were applied.  The patient was then turned prone onto the Wilson frame and all bony prominences were well  padded.  The back was then prepped and draped in a standard fashion.  A time-out was then done to confirm patient, procedure, and all other pertinent important data.  Once this was completed, the incision site was infiltrated with 0.25% Marcaine with epinephrine and a midline incision was made starting at the superior aspect of L4 proceeding to the inferior aspect of the L5 spinous process.  Sharp dissection was carried out down to the deep fascia. Deep fascia was sharply incised and exposed the spinous process of L4 and L5  using Cobb elevators and electrocautery.  Once this was done bilaterally, I then swept over the 3-4 and 4-5 facet complexes to expose the L4 and L5 transverse processes.  Care was taken not to violate the facet capsule at L3-4.  The L4-5 facet capsule was completely removed in order to facilitate the fusion.  I then took an intraoperative lateral fluoro view confirming the L4-5 disk space level.  Once this was done, I removed the bulk of the L4 and L5 spinous processes with a double-action Leksell rongeur.  I then identified the posterior edge of the L4 lamina and then defined it with a curette.  I then used a 2 and 3 mm Kerrison to begin a laminectomy of L4.  I then proceeded to the central region and into the contralateral side.  I have very wide central decompression.  I then identified a very thickened ligamentum flavum and bone spur that was in the lateral recesses with the right side being more prominent than the left.  Working on the contralateral side, I gently used a Penfield 4 to dissect and create a plane between the thecal sac and the overlying ligamentum flavum and bone spur.  Once I was able to do this, I was able to use the 2 and 3 mm Kerrison to resect all  the bone spur from the lateral recess.  This allowed me to identify the L4-5 disk space.  I then carried my dissection inferiorly until I could identify the L5 pedicle.  I then identified the L5 nerve root and then continued decompressing the L5 nerve root as it turned into the foramen.  I could then palpate the pedicle into the L5 foramen.  With the [facility], there was adequate decompression of the right L5 nerve root in the lateral recess and in the foramen.  I then went superiorly and identified the L4 foramen.  This was able to be palpated by identifying the pedicle of L4.  I then did a foraminotomy with a 2 and 3 mm Kerrison and then identified the L4 nerve root and saw that it was completely decompressed.  I  was then able to take a [facility] elevator into the lateral recess above the pedicle and confirmed that I was adequately decompressed.  At this point with the right lateral decompression completed of the L4 and L5 nerve root, I then went to the contralateral side and did the same thing, but at this time working from the right side of the table into the left lateral recess.  Once I had an adequate central decompression and lateral recess decompression, I irrigated copiously with normal saline.  Again, I confirmed that my [facility] could freely pass into the foramen and superiorly and inferiorly.  I then took a final x-ray confirming that I had decompressed from the superior aspect of the L4 pedicle to the inferior aspect of the L5 pedicle.  This corresponded with the area of severe compression on the preoperative MRI.  Once this was done, I then decorticated the transverse process of 4 and 5 with a high-speed bur, and packed the bone graft that I had harvested from the decompression in the lateral gutter along with DBX.  I then irrigated copiously with normal saline, used my bipolar electrocautery to obtain hemostasis, and then placed a thrombin-soaked Gelfoam patty over the exposed thecal sac. I then closed in a layered fashion with interrupted #1 Vicryl suture, 2- 0 Vicryl sutures, and 3-0 Monocryl.  Steri-Strips and dry dressing were applied.  The patient was extubated, transferred to the PACU without incident.  At the end of the case, all needle and sponge counts were correct.  There was no adverse intraoperative events.     Dahlia Bailiff, MD     DDB/MEDQ  D:  09/10/2012  T:  09/11/2012  Job:  [id number]

## 2012-10-21 DIAGNOSIS — Z981 Arthrodesis status: Secondary | ICD-10-CM | POA: Diagnosis not present

## 2012-12-30 DIAGNOSIS — H26499 Other secondary cataract, unspecified eye: Secondary | ICD-10-CM | POA: Diagnosis not present

## 2012-12-30 DIAGNOSIS — H3530 Unspecified macular degeneration: Secondary | ICD-10-CM | POA: Diagnosis not present

## 2012-12-30 DIAGNOSIS — E119 Type 2 diabetes mellitus without complications: Secondary | ICD-10-CM | POA: Diagnosis not present

## 2013-01-27 ENCOUNTER — Telehealth: Payer: Self-pay | Admitting: Cardiovascular Disease

## 2013-01-27 NOTE — Telephone Encounter (Signed)
New message  Patient states that he is scheduled for lower extremity doppler and Dr Burt Knack wanted to see him on the same day. Can I work this patient in? If so please advise.

## 2013-01-27 NOTE — Telephone Encounter (Signed)
I do not have any openings on 02/13/13.  The pt can reschedule his test to a different day to arrange for same day appointments. I do have an opening on Dr Antionette Char schedule 02/17/13 if you would like to contact the pt to reschedule appointments. I will forward this message back to Lourena Simmonds to contact the pt.

## 2013-02-02 DIAGNOSIS — H353190 Nonexudative age-related macular degeneration, unspecified eye, stage unspecified: Secondary | ICD-10-CM | POA: Diagnosis not present

## 2013-02-02 DIAGNOSIS — H35379 Puckering of macula, unspecified eye: Secondary | ICD-10-CM | POA: Diagnosis not present

## 2013-02-10 ENCOUNTER — Encounter: Payer: Self-pay | Admitting: Pulmonary Disease

## 2013-02-10 ENCOUNTER — Ambulatory Visit (INDEPENDENT_AMBULATORY_CARE_PROVIDER_SITE_OTHER): Payer: Medicare Other | Admitting: Pulmonary Disease

## 2013-02-10 VITALS — BP 144/60 | HR 61 | Temp 98.3°F | Ht 69.0 in | Wt 172.8 lb

## 2013-02-10 DIAGNOSIS — M1990 Unspecified osteoarthritis, unspecified site: Secondary | ICD-10-CM

## 2013-02-10 DIAGNOSIS — I7389 Other specified peripheral vascular diseases: Secondary | ICD-10-CM

## 2013-02-10 DIAGNOSIS — E7800 Pure hypercholesterolemia, unspecified: Secondary | ICD-10-CM

## 2013-02-10 DIAGNOSIS — M545 Low back pain: Secondary | ICD-10-CM

## 2013-02-10 DIAGNOSIS — F419 Anxiety disorder, unspecified: Secondary | ICD-10-CM

## 2013-02-10 DIAGNOSIS — I6529 Occlusion and stenosis of unspecified carotid artery: Secondary | ICD-10-CM

## 2013-02-10 DIAGNOSIS — I6521 Occlusion and stenosis of right carotid artery: Secondary | ICD-10-CM

## 2013-02-10 DIAGNOSIS — D638 Anemia in other chronic diseases classified elsewhere: Secondary | ICD-10-CM

## 2013-02-10 DIAGNOSIS — I25810 Atherosclerosis of coronary artery bypass graft(s) without angina pectoris: Secondary | ICD-10-CM | POA: Diagnosis not present

## 2013-02-10 DIAGNOSIS — I10 Essential (primary) hypertension: Secondary | ICD-10-CM | POA: Diagnosis not present

## 2013-02-10 DIAGNOSIS — E119 Type 2 diabetes mellitus without complications: Secondary | ICD-10-CM

## 2013-02-10 MED ORDER — GLUCOSE BLOOD VI STRP: ORAL_STRIP | Status: DC

## 2013-02-10 MED ORDER — ACCU-CHEK MULTICLIX LANCETS MISC: Status: DC

## 2013-02-10 NOTE — Progress Notes (Signed)
Subjective:    Patient ID: Henry [name], male    DOB: 1924/10/06, 79 y.o.   MRN: [id number]  HPI 79 y/o WM here for a follow up visit... he has mult med problems as listed and is followed by DrBBrodie and DrPerry as well... he gets his meds filled at the [facility] which makes for a complicated array of perscribed meds and substituted alternatives...  ~  August 02, 2010:  16mo ROV & Danan has been going to the [facility] for his meds & his care> his med list indicates that they stopped his Lisinopril (?due to renal insuffic) & substituted Losartan 25mg /d, and they restarted his HCTZ at 12.5mg /d due to edema... His BP remains well controlled, but he notes some dizziness & light headed in the AM on arising since the changes were made> he is asked to monitor BP at home (supine & standing), change positions slowly, & he has f/u at the [place] next month (he will bring his labs for [place] to review)...  Another issue is the right sm toe> he has a lesion (?wart-like growth) on the medial side of the toe & it rubs against his 4th toe w/ pain/ discomfort> advised to keep cotton vs gauze betw toes to decr rubbing & consider Podiatric consult but I voiced caution w/ any surg approach due to his periph vasc dis (I noted that non-healing of any surg could lead to ray amput or worse)... He saw [name] 12/11 last- note reviewed... He is under mod stress caring for wife w/ dementia & he does all the cooking etc...  ~  August 19, 2012:  64yr ROV & Clee returns for pre-op medical clearance for back surgery planned by DrBrooks at [facility]; He continues to get his general medical care & meds from the [facility], he is followed by [name] for [employer]; We reviewed the following medical problems during today's office visit >> Note- he did not bring med bottles (he does not know his medications), med list, or prev labs from the [facility]...    He was [facility] by [facility] 2/13 - 04/13/12 w/ dizziness, n/v, dehydration- felt to be due to a gastroenteritis;  resolved w/ supportive therapy, IV fluids, rec for vestib therapy as outpt; he never f/u w/ anyone after this adm... [place]ft 2/14 showed sm vessel dis, vasc calcif, global atrophy (no hemorrhage or large area infarct);  MRI Brain confirmed mod advanced atrophy, sm vessel dis, no infarct or hem...     HBP> on Metop25/d, Amlod10, Losar25, Lasix20;  BP= 160/64 but he is in pain & ?took his meds today; he denies CP, palpit, ch in SOB, edema, etc; asked to incr Metop25Bid...    ASHD> on ASA81; followed by [name] & last seen 12/13; [facility]; he gets back pain & claud w/ mod activ- no angina; last NuclearStressTest 9/08 was normal without ischemia and EF=68%; can't find Echo in epic or Centric...    Carotid Art Dis> on ASA81; he denies cerebral ischemic symptoms; CT/ MRI Brain 2/14 as above; last CDoppler 1/13 showed mild heterogeneous plaque bilat w/ 40-59% bilat ICAstenoses & f/u is overdue...     Periph Vasc Dis> on ASA81; he has stable claud w/ ambulation but lim by back pain now; last Art Dopplers 1/13 showed ABIs .92R & .80L, TBIs were abn but w/o risk of tissue loss; overdue for yearly f/u studies...    CHOL> on Lip20 (off prev Crestor); Labs done at [facility] but he  didn't bring reports; he is not fasting today for recheck...    DM> on diet alone (off prev Metform); Labs 2/14 Hosp w/ BS= 150-200+ range; now BS=132, A1c=6.6; he follows low carb diet...    Renal Insuffic> he knows to avoid NSAIDs; Labs 2/14 w/ Creat= 1.4-1.6 range; Labs today showed BUN= 21, Creat= 1.7    DJD, LBP w/ spinal stenosis> on Tramadol50 prn; followed by DrRamos=> DrBrooks at [facility]; he has had PT, he rates back pain 10/10 w/ radiation to right leg; they did MRI w/ report of sp stenosis severe at L4-5; he wants relief & they plan surg if cleared.    Anemia> on FeSO4 325mg /d; Labs 2/14 w/ Hg= 10-11 range; Labs now show Hg= 11.3 We reviewed prob list, meds, xrays and labs> see below for updates >>   CXR 6/14 showed normal heart size, tortuous Ao, mild COPD changes w/ clear lungs/ NAD, DJD in Tspine...  LABS 6/14:  Chems- ok x BS=132 A1c=6.6 Creat=1.7;  CBC- Hg=11.3;  TSH=5.84;  B12=530;  BNP=198...  ~  February 10, 2013:  17mo ROV & Avedis reports a good interval- s/p lumbar decompression & fusion, no new complaints or concerns, he is under stress wife is 70 w/ Alz... We reviewed the following medical problems during today's office visit >>     HBP> on Metop25Bid, Amlod10, Losar25-3/d, Lasix20;  BP= 144/60 & he denies CP, palpit, ch in SOB, edema, etc...    ASHD> on ASA81; known CAD & s/p CABG 2000; followed by [name] & last seen 6/14- cleared for back surg after norm Myoview 7/14; he gets back pain & claud w/ mod activ- no angina...    Carotid Art Dis> on ASA81; he denies cerebral ischemic symptoms; CT/ MRI Brain 2/14 w/ atrophy & sm vessel dis; last CDoppler 1/13 showed mild heterogeneous plaque bilat w/ 40-59% bilat ICAstenoses & f/u is overdue...     Periph Vasc Dis> on ASA81; he has stable claud w/ ambulation; last Art Dopplers 1/13 showed ABIs .92R & .80L, TBIs were abn but w/o risk of tissue loss; overdue for yearly f/u studies...    CHOL> on Lip20 (off prev Crestor); Labs 12/14 showed TChol 127, TG 56, HDL 41, LDL 75...    DM> on diet alone (off prev Metform); Labs 12/14 showed BS= 131, A1c=6.9; he has gained 13# & we reviewed diet, exercise, etc....    Renal Insuffic> he knows to avoid NSAIDs; Labs 12/14 w/ Creat= 1.8, stable...    DJD, LBP w/ spinal stenosis> on Tramadol50 prn; followed by DrRamos=> DrBrooks at [facility]; they did MRI w/ report of sp stenosis severe at L4-5; surg 7/14 w/ lumbar decompression & fusion L4-5...    Anemia> on FeSO4 325mg /d; Labs 2/14 w/ Hg= 10-11 range; Labs 12/14 showed Hg= 10.0 We reviewed prob list, meds, xrays and labs> see below for updates >> he had the 2014 Flu vaccine in Oct... LABS 12/14:  FLP- at goals on Lip20;  Chems- ok x BS=131, A1c=6.9,  Cr=1.8;  CBC= Hg= 10.0;  TSH=8.74 & rec to start Synthroid50...          Problem List:  HYPERTENSION (ICD-401.9) - controlled on 4 meds:  METOPROLOL 25mg Bid, AMLODIPINE 10mg /d, LOSARTAN 25mg /d, and HCT 25mg - 1/2 qd... BP=128/68 and doing well except he notes sl dizzy in the AM... denies HA, fatigue, visual changes, CP, palipit, syncope, dyspnea, edema, etc... ~  2DEcho 1996 showed mild LVH, AoV sclerosis & mild AI, EF= 60%... ~  labs 11/11  showed BUN=36, Creat=2.1, K=5.1.Marland KitchenMarland Kitchen rec to stop HCTZ... ~  [facility] subseq re-started 12.5mg  HCTZ in the interim due to edema... ~  6/14:  on Metop25/d, Amlod10, Losar25, Lasix20;  BP= 160/64 but he is in pain & ?took his meds today; he denies CP, palpit, ch in SOB, edema, etc; asked to incr Metop25Bid.  ~  CXR 6/14 showed norm heart size, atherosclerotic & tort Ao, emphysematous changes in upper lobes & scarring in lower lobes, DJD in spine, NAD... ~  12/14: on Metop25Bid, Amlod10, Losar25-3/d, Lasix20;  BP= 144/60 & he denies CP, palpit, ch in SOB, edema, etc.  ARTERIOSCLEROTIC HEART DISEASE (ICD-414.00) - on ASA 81mg /d... long time pt of DrJoeLeBauer & followed by DrBrodie now... s/p CABG in 2000 by DrBartle (for L main dis)... he is active & w/o angina, palpit, change in SOB/ DOE, etc... ~  NuclearStressTest 9/08 was normal without ischemia and EF=68%... ~  12/13: he saw [name] for f/u CAD, s/p CABG, PAD, Carotid Art dis> no angina, bilat calf pain w/ ambulation but felt to be stable; he adjusted meds, but [facility] after [facility]... ~  EKG 6/14 showed SBrady, rate56, 1st degree AVB, otherw wnl... ~  7/14:  Myoview showed normal wall motion & EF=61%, no CP or EKG changes...  CAROTID ARTERY DISEASE>  PERIPHERAL VASCULAR DISEASE (ICD-443.9) - on ASA daily and he has bilat femoral bruits... he has seen DrDowney in the past (now [name]) w/ exerc program recommended... he walks regularly w/ stable claud symptoms, no sores or lesions on LE's... ~  ABI's  10/08 were normal bilat = 1.0.Marland KitchenMarland Kitchen waveforms were biphasic however (no change from 2005). ~  CDoppler's 10/08 showed mild bilat carotid dis w/ 40-59% RICA stenosis, and [id number] LICA stenosis. ~  CDoppler's 10/09 showed the same= stable. ~  CDoppler's 11/10 showed stable mild carotid dis bilat> [id number] RICA & [id number] LICA stenoses. ~  ABI's 11/10 showed prob mod severe >50% distal right SFA stenosis, & mod dis in left SFA, ABI's=0.9 ~  Podiarty sent him to [facility] for ArtDopplers 9/11> ABIs 0.82 R & 0.74L w/ bilat SFA dis L>R, toe pressures adeq for healing... ~  6/14: on ASA81; he denies cerebral ischemic symptoms; CT/ MRI Brain 2/14 showed sm vessel dis, vasc calcif, global atrophy (no hemorrhage or large area infarct); last CDoppler 1/13 showed mild heterogeneous plaque bilat w/ 40-59% bilat ICAstenoses... ~  6/14:  on ASA81; he has stable claud w/ ambulation but lim by back pain now; last Art Dopplers 1/13 showed ABIs .92R & .80L, TBIs were abn but w/o risk of tissue loss; overdue for yearly f/u studies of Carotids & ABIs...  HYPERCHOLESTEROLEMIA (ICD-272.0) - prev controlled on Simva40 + diet, the [facility] CRESTOR 10mg /d. ~  [place] 2/09 on Simva40 showed Tchol 169, TG 206, HDL 37, LDL 96 ~  FLP 4/10 on Simva40 showed TChol 146, TG 145, HDL 36, LDL 82 ~  FLP 10/10 on Simva40 showed TChol 166, TG 130, HDL 38, LDL 102 ~  FLP 4/11 on Simva40 showed TChol 138, TG 87, HDL 40, LDL 80... [facility] subseq ch to Cres10 ~  FLP 11/11 on Cres10 showed TChol 147, TG 161, HDL 34, LDL 81 ~  6/12:  He indicates interval labs done by the [facility] but he didn't bring copies for [place] to review... ~  6/14: on Lip20 (off prev Crestor); Labs done at [facility] but he didn't bring reports; he is not fasting today for recheck.  ~  FLP 12/14 on Lip20 showed TChol 127, TG 56, HDL 41, LDL 75   DIABETES MELLITUS (ICD-250.00) - he's struggled w/ diet Rx... wt stable ~175 range... prev on Metformin 500Bid but [facility] changed to GLYBURIDE 5mg /d,  then decr to 1/2 tab daily due to side effects & he stopped due to hypogly. ~  labs 10/08 showed FBS= 134, and HgA1c= 5.4... ~  labs 6/09 showed BS= 101, HgA1c= 6.4.Marland KitchenMarland Kitchen monofilament test is normal. ~  [facility] eval 1/10 at [facility] showed no DM retinopathy... ~  labs 4/10 on MetformBid showed BS= 106, A1c= 6.8, Urine Microalb= sl incr @ 7.8mg % ~  labs 10/10 showed BS= 119, A1c= 6.5...  NOTE: 2/11 [facility] ch to Glybur5mg - 1/2 daily. ~  labs 4/11 on Glyb2.5 showed BS= 97, A1c= 6.6.Marland KitchenMarland Kitchen pt subseq stopped Glybur due to low sugars. ~  labs 11/11 off meds showed BS= 149, A1c= 6.7.Marland KitchenMarland Kitchen coninue diet Rx. ~  6/12:  He indicates interim labs done by the [facility] but he didn't bring copies for [place] to review... ~  6/14: on diet alone (off prev Metform); Labs 2/14 Hosp w/ BS= 150-200+ range; now BS=132, A1c=6.6; he follows low carb diet ~  Labs 12/14 on diet alone (weight up 13# to 173#) showed BS= 131, A1c= 6.9  RENAL INSUFFICIENCY (ICD-588.9) - Creat 1.8-2.0 in the past... ~  labs 6/09 showed BUN= 31, Creat= 1.6... ~  labs 9/09 showed BUN= 25, Creat= 1.6, K= 4.5 ~  labs 4/10 showed BUN= 26, Creat= 1.6, K= 5.0 ~  labs 10/10 showed BUN= 21, Creat= 1.6 ~  labs 4/11 showed BUN= 30, Creat= 1.9..Marland Kitchen advised to stay well hydrated. ~  labs 11/11 showed BUN= 36, Creat= 2.1.Marland KitchenMarland Kitchen rec to stop the HCTZ ~  6/12:  He indicates that the [facility] restarted HCTZ 12.5mg /d & they are following labs... ~  6/14: he knows to avoid NSAIDs; Labs 2/14 w/ Creat= 1.4-1.6 range; Labs today showed BUN= 21, Creat= 1.7... ~  Labs 12/14 showed BUN= 23, Creat= 1.8  DEGENERATIVE JOINT DISEASE (ICD-715.90) LOW BACK PAIN >> he has severe sp stenosis L4-5 per MRI at [facility] 6/14... ~  Notes and Records fro [facility]- DrRamos, DrBrooks> reviewed, they plan surgery if cleared fro CV standpoint...  Hx of ANEMIA OF CHRONIC DISEASE (ICD-285.29) - eval by DrGranfortuna w/ ACD secondary to his renal insuffic and Rx w/ aranesp periodically... nothing else  found on his extensive eval... he was taking Ferrous Gluconate daily (it no longer makes him tired)... ~  labs 2/09 w/ Hg= 11.2.Marland Kitchen. ~  labs 6/09 w/ Hg= 11.0.Marland Kitchen. rec> OTC Fe prep but he never got it! ~  labs 9/09 showed Hg= 10.5, Fe= 81 ~  labs 4/10 showed Hg= 10.5, MCV= 92, Fe= 77 (22%sat), B12= 590, Folate>20... rec> OTC Fe daily. ~  labs 10/10 showed Hg= 10.3, Fe= 88 ~  labs 4/11 showed Hg= 10.9,  Fe= 58... continue Ferrous Gluc daily w/ VitC ~  labs 11/11 showed Hg= 11.6 ~  6/12:  Again he indicated that the [facility] is following his labs, aske to bring copies for [place] to review. ~  6/14: on FeSO4 325mg /d; Labs 2/14 w/ Hg= 10-11 range; Labs now show Hg= 11.3 ~  Labs 12/14 showed Hg= 10.0   Past Surgical History  Procedure Laterality Date  . Cataract surgery    . Coronary artery bypass graft  2000    by Dr. Cyndia Bent  . Tonsillectomy    . Colonoscopy  Hx: of  . Lumbar laminectomy/decompression microdiscectomy N/A 09/10/2012    Procedure: LUMBAR DECOMPRESSION,  IN SITU FUSION LUMBAR 4-5;  Surgeon: Melina Schools, MD;  Location: [place];  Service: Orthopedics;  Laterality: N/A;    Outpatient Encounter Prescriptions as of 02/10/2013  Medication Sig  . amLODipine (NORVASC) 10 MG tablet Take 10 mg by mouth every evening.   . Ascorbic Acid (VITAMIN C PO) Take 90 mg by mouth every morning. `  . aspirin EC 81 MG tablet Take 81 mg by mouth every morning.   Marland Kitchen atorvastatin (LIPITOR) 20 MG tablet Take 20 mg by mouth every evening.   . Calcium Carbonate-Vitamin D (CALCIUM 600/VITAMIN D PO) Take 1 tablet by mouth 2 (two) times daily.   . ferrous gluconate (FERGON) 325 MG tablet Take 325 mg by mouth daily with breakfast.   . furosemide (LASIX) 20 MG tablet Take 20 mg by mouth daily as needed for fluid or edema.   Marland Kitchen glucose blood (ACCU-CHEK AVIVA PLUS) test strip Use as instructed  . Lancets (ACCU-CHEK MULTICLIX) lancets Use as instructed  . losartan (COZAAR) 25 MG tablet Take 75 mg by mouth every  evening.   . metoprolol tartrate (LOPRESSOR) 25 MG tablet Take 1 tablet (25 mg total) by mouth 2 (two) times daily.  . Multiple Vitamins-Minerals (CENTRUM SILVER PO) Take 1 tablet by mouth every morning.   . Multiple Vitamins-Minerals (PRESERVISION AREDS 2 PO) Take 1 tablet by mouth every evening.  . [DISCONTINUED] docusate sodium (COLACE) 100 MG capsule Take 1 capsule (100 mg total) by mouth 3 (three) times daily as needed for constipation.  . [DISCONTINUED] methocarbamol (ROBAXIN) 500 MG tablet Take 1 tablet (500 mg total) by mouth 3 (three) times daily as needed.  . [DISCONTINUED] ondansetron (ZOFRAN) 4 MG tablet Take 1 tablet (4 mg total) by mouth every 8 (eight) hours as needed for nausea.  . [DISCONTINUED] polyethylene glycol powder (GLYCOLAX) powder Take 17 g by mouth daily.  . [DISCONTINUED] traMADol (ULTRAM) 50 MG tablet Take 1 tablet (50 mg total) by mouth every 6 (six) hours as needed for pain.    No Known Allergies   Review of Systems        See HPI - all other systems neg except as noted... The patient complains of decreased hearing, dyspnea on exertion, and difficulty walking.  The patient denies anorexia, fever, weight loss, weight gain, vision loss, hoarseness, chest pain, syncope, peripheral edema, prolonged cough, headaches, hemoptysis, abdominal pain, melena, hematochezia, severe indigestion/heartburn, hematuria, incontinence, muscle weakness, suspicious skin lesions, transient blindness, depression, unusual weight change, abnormal bleeding, enlarged lymph nodes, and angioedema.    Objective:   Physical Exam     WD, WN, 79 y/o WM in NAD... GENERAL:  Alert & oriented; pleasant & cooperative... HEENT:  [place]/AT, EOM-wnl, PERRLA, EACs-clear, TMs-wnl, NOSE-clear, THROAT-clear & wnl. NECK:  Supple w/ fairROM; no JVD; normal carotid impulses w/ faint right bruit; no thyromegaly or nodules palpated; no lymphadenopathy. CHEST:  Clear to P & A; without wheezes/ rales/ or rhonchi  heard... HEART:  Regular, gr 1/6 SEM without rubs or gallops heard... ABDOMEN:  Soft & nontender; normal bowel sounds; no organomegaly or masses detected. EXT: without deformities, mod arthritic changes; no varicose veins/ venous insuffic/ or edema. he has bilat fem art bruits & minor pressure area betw right 4th & 5th toes... NEURO:  CN's intact;  no focal neuro deficits... DERM:  Lesion on medial side of right small toe, sl tender...   Assessment & Plan:  HBP>  BP is OK on the current regimen; continue same; he is also followed by the [facility]...  ASHD/ CAD>  Followed by [name] for [facility] & [facility]; continue current meds...  ASPVD- Carotid art dis & PAD>  Decr ABIs & Carotid dis> on ASA 81mg /d; followed by [name] & due for repeat CDoppler & ABIs; refer to Cards for Pre-op eval...  CHOL>  On Lipitor20 and FLP is at goals...  DM>  On diet alone & labs also followed by there [facility]; labs today showed BS=131, A1c=6.9 (OK)...  Renal Insuffic>  Creat stable in the 1.8 range on current meds, better off NSAIDs & HCTZ...  Anemia>  Hg seems stable in the 10-11 range w/ ACD...   Patient's Medications  New Prescriptions   LEVOTHYROXINE (SYNTHROID) 50 MCG TABLET    Take 1 tablet (50 mcg total) by mouth daily before breakfast.  Previous Medications   AMLODIPINE (NORVASC) 10 MG TABLET    Take 10 mg by mouth every evening.    ASCORBIC ACID (VITAMIN C PO)    Take 90 mg by mouth every morning. `   ASPIRIN EC 81 MG TABLET    Take 81 mg by mouth every morning.    ATORVASTATIN (LIPITOR) 20 MG TABLET    Take 20 mg by mouth every evening.    CALCIUM CARBONATE-VITAMIN D (CALCIUM 600/VITAMIN D PO)    Take 1 tablet by mouth 2 (two) times daily.    FERROUS GLUCONATE (FERGON) 325 MG TABLET    Take 325 mg by mouth daily with breakfast.    FUROSEMIDE (LASIX) 20 MG TABLET    Take 20 mg by mouth daily as needed for fluid or edema.    LOSARTAN (COZAAR) 25 MG TABLET    Take 75 mg by mouth every evening.     METOPROLOL TARTRATE (LOPRESSOR) 25 MG TABLET    Take 1 tablet (25 mg total) by mouth 2 (two) times daily.   MULTIPLE VITAMINS-MINERALS (CENTRUM SILVER PO)    Take 1 tablet by mouth every morning.    MULTIPLE VITAMINS-MINERALS (PRESERVISION AREDS 2 PO)    Take 1 tablet by mouth every evening.  Modified Medications   Modified Medication Previous Medication   GLUCOSE BLOOD (ACCU-CHEK AVIVA PLUS) TEST STRIP glucose blood (ACCU-CHEK AVIVA PLUS) test strip      Use as instructed    Use as instructed   LANCETS (ACCU-CHEK MULTICLIX) LANCETS Lancets (ACCU-CHEK MULTICLIX) lancets      Use as instructed    Use as instructed  Discontinued Medications   DOCUSATE SODIUM (COLACE) 100 MG CAPSULE    Take 1 capsule (100 mg total) by mouth 3 (three) times daily as needed for constipation.   METHOCARBAMOL (ROBAXIN) 500 MG TABLET    Take 1 tablet (500 mg total) by mouth 3 (three) times daily as needed.   ONDANSETRON (ZOFRAN) 4 MG TABLET    Take 1 tablet (4 mg total) by mouth every 8 (eight) hours as needed for nausea.   POLYETHYLENE GLYCOL POWDER (GLYCOLAX) POWDER    Take 17 g by mouth daily.   TRAMADOL (ULTRAM) 50 MG TABLET    Take 1 tablet (50 mg total) by mouth every 6 (six) hours as needed for pain.

## 2013-02-10 NOTE — Patient Instructions (Signed)
Today we updated your med list in our EPIC system...    Continue your current medications the same...  Please return to our lab one morning this week for your FASTING blood work...    We will contact you w/ the results when available...   Stay as active as poss...  Call for any questions...  Let's plan a follow up visit in 39mo, sooner if needed for problems.Marland KitchenMarland Kitchen

## 2013-02-11 ENCOUNTER — Other Ambulatory Visit (INDEPENDENT_AMBULATORY_CARE_PROVIDER_SITE_OTHER): Payer: Medicare Other

## 2013-02-11 DIAGNOSIS — I10 Essential (primary) hypertension: Secondary | ICD-10-CM | POA: Diagnosis not present

## 2013-02-11 DIAGNOSIS — E7800 Pure hypercholesterolemia, unspecified: Secondary | ICD-10-CM | POA: Diagnosis not present

## 2013-02-11 DIAGNOSIS — E119 Type 2 diabetes mellitus without complications: Secondary | ICD-10-CM

## 2013-02-11 DIAGNOSIS — D638 Anemia in other chronic diseases classified elsewhere: Secondary | ICD-10-CM | POA: Diagnosis not present

## 2013-02-11 DIAGNOSIS — F411 Generalized anxiety disorder: Secondary | ICD-10-CM

## 2013-02-11 DIAGNOSIS — F419 Anxiety disorder, unspecified: Secondary | ICD-10-CM

## 2013-02-11 LAB — CBC WITH DIFFERENTIAL/PLATELET
Basophils Absolute: 0.1 10*3/uL (ref 0.0–0.1)
Eosinophils Relative: 5.4 % — ABNORMAL HIGH (ref 0.0–5.0)
HCT: 29.5 % — ABNORMAL LOW (ref 39.0–52.0)
Hemoglobin: 10 g/dL — ABNORMAL LOW (ref 13.0–17.0)
Lymphocytes Relative: 24.5 % (ref 12.0–46.0)
Lymphs Abs: 1.7 10*3/uL (ref 0.7–4.0)
Monocytes Relative: 15 % — ABNORMAL HIGH (ref 3.0–12.0)
Neutro Abs: 3.6 10*3/uL (ref 1.4–7.7)
Platelets: 197 10*3/uL (ref 150.0–400.0)
WBC: 6.8 10*3/uL (ref 4.5–10.5)

## 2013-02-11 LAB — HEPATIC FUNCTION PANEL
Albumin: 4.1 g/dL (ref 3.5–5.2)
Total Protein: 7.2 g/dL (ref 6.0–8.3)

## 2013-02-11 LAB — LIPID PANEL
Cholesterol: 127 mg/dL (ref 0–200)
HDL: 40.6 mg/dL (ref >39.00)
Triglycerides: 56 mg/dL (ref 0.0–149.0)
VLDL: 11.2 mg/dL (ref 0.0–40.0)

## 2013-02-11 LAB — BASIC METABOLIC PANEL
BUN: 23 mg/dL (ref 6–23)
CO2: 27 mEq/L (ref 19–32)
Calcium: 9.1 mg/dL (ref 8.4–10.5)
GFR: 38.64 mL/min — ABNORMAL LOW (ref >60.00)
Glucose, Bld: 131 mg/dL — ABNORMAL HIGH (ref 70–99)

## 2013-02-11 LAB — TSH: TSH: 8.74 u[IU]/mL — ABNORMAL HIGH (ref 0.35–5.50)

## 2013-02-12 ENCOUNTER — Other Ambulatory Visit: Payer: Self-pay | Admitting: Pulmonary Disease

## 2013-02-12 MED ORDER — LEVOTHYROXINE SODIUM 50 MCG PO TABS: 50.0000 ug | ORAL_TABLET | Freq: Every day | ORAL | Status: DC

## 2013-02-12 NOTE — Telephone Encounter (Signed)
rx has been printed out and pt requested to have this mailed to him.  rx has been mailed to him.

## 2013-02-13 ENCOUNTER — Ambulatory Visit (HOSPITAL_COMMUNITY): Payer: Medicare Other | Attending: Cardiovascular Disease | Admitting: Cardiovascular Disease

## 2013-02-13 ENCOUNTER — Other Ambulatory Visit: Payer: Self-pay

## 2013-02-13 DIAGNOSIS — I10 Essential (primary) hypertension: Secondary | ICD-10-CM | POA: Diagnosis not present

## 2013-02-13 DIAGNOSIS — I739 Peripheral vascular disease, unspecified: Secondary | ICD-10-CM

## 2013-02-13 DIAGNOSIS — I25810 Atherosclerosis of coronary artery bypass graft(s) without angina pectoris: Secondary | ICD-10-CM

## 2013-02-13 DIAGNOSIS — E7800 Pure hypercholesterolemia, unspecified: Secondary | ICD-10-CM | POA: Diagnosis not present

## 2013-02-13 DIAGNOSIS — I70219 Atherosclerosis of native arteries of extremities with intermittent claudication, unspecified extremity: Secondary | ICD-10-CM | POA: Diagnosis not present

## 2013-02-17 ENCOUNTER — Ambulatory Visit (INDEPENDENT_AMBULATORY_CARE_PROVIDER_SITE_OTHER): Payer: Medicare Other | Admitting: Cardiovascular Disease

## 2013-02-17 ENCOUNTER — Encounter: Payer: Self-pay | Admitting: Cardiovascular Disease

## 2013-02-17 VITALS — BP 150/68 | Ht 69.0 in | Wt 162.0 lb

## 2013-02-17 DIAGNOSIS — I7389 Other specified peripheral vascular diseases: Secondary | ICD-10-CM

## 2013-02-17 DIAGNOSIS — I10 Essential (primary) hypertension: Secondary | ICD-10-CM | POA: Diagnosis not present

## 2013-02-17 NOTE — Patient Instructions (Addendum)
Your physician wants you to follow-up in: 1 year with Dr. Burt Knack.  You will receive a reminder letter in the mail two months in advance. If you don't receive a letter, please call our office to schedule the follow-up appointment.  Your physician has requested that you have an ankle brachial index (ABI) prior to your appointment with Dr. Burt Knack in 1 year. During this test an ultrasound and blood pressure cuff are used to evaluate the arteries that supply the arms and legs with blood. Allow thirty minutes for this exam. There are no restrictions or special instructions.  Your physician has requested that you have a lower extremity arterial duplex prior to your appointment with Dr. Burt Knack in 1 year. This test is an ultrasound of the arteries in the legs or arms. It looks at arterial blood flow in the legs and arms. Allow one hour for Lower and Upper Arterial scans. There are no restrictions or special instructions  Your physician recommends that you continue on your current medications as directed. Please refer to the Current Medication list given to you today.

## 2013-02-17 NOTE — Progress Notes (Signed)
HPI:  79 year old gentleman presenting for followup evaluation. The patient is followed for coronary artery disease with remote CABG 14 years ago. He's also followed for peripheral arterial disease with intermittent claudication, hypertension, hyperlipidemia, and chronic kidney disease. He had a Lexiscan Myoview this summer for preoperative evaluation and this demonstrated no ischemia with a left ventricular ejection fraction of 61%. Recent labs show a creatinine of 1.8, normal transaminases, cholesterol 127, triglycerides 56, HDL 41, and LDL 75.  He's doing fairly well. He remains active. He lives independently and cares for his wife who has dementia. He denies chest pain or pressure. He has intermittent swelling of his legs. He attributes this to salt intake. He does all the cooking now and has to prepare frozen meals which she thinks are high in salt. He admits to chronic dyspnea with exertion, but denies orthopnea or PND.  Outpatient Encounter Prescriptions as of 02/17/2013  Medication Sig  . amLODipine (NORVASC) 10 MG tablet Take 10 mg by mouth every evening.   . Ascorbic Acid (VITAMIN C PO) Take 90 mg by mouth every morning. `  . aspirin EC 81 MG tablet Take 81 mg by mouth every morning.   Marland Kitchen atorvastatin (LIPITOR) 20 MG tablet Take 20 mg by mouth every evening.   . Calcium Carbonate-Vitamin D (CALCIUM 600/VITAMIN D PO) Take 1 tablet by mouth 2 (two) times daily.   . ferrous gluconate (FERGON) 325 MG tablet Take 325 mg by mouth daily with breakfast.   . furosemide (LASIX) 20 MG tablet Take 20 mg by mouth daily as needed for fluid or edema.   Marland Kitchen glucose blood (ACCU-CHEK AVIVA PLUS) test strip Use as instructed  . Lancets (ACCU-CHEK MULTICLIX) lancets Use as instructed  . levothyroxine (SYNTHROID) 50 MCG tablet Take 1 tablet (50 mcg total) by mouth daily before breakfast.  . losartan (COZAAR) 25 MG tablet Take 75 mg by mouth every evening.   . metoprolol tartrate (LOPRESSOR) 25 MG tablet  Take 1 tablet (25 mg total) by mouth 2 (two) times daily.  . Multiple Vitamins-Minerals (CENTRUM SILVER PO) Take 1 tablet by mouth every morning.   . Multiple Vitamins-Minerals (PRESERVISION AREDS 2 PO) Take 1 tablet by mouth every evening.    No Known Allergies  Past Medical History  Diagnosis Date  . Hypertension   . CAD of autologous bypass graft   . Carotid artery disease   . PVD (peripheral vascular disease) with claudication   . Hypercholesteremia   . Renal insufficiency   . DJD (degenerative joint disease)   . History of anemia of chronic disease   . Diabetes mellitus without complication     ROS: Negative except as per HPI  BP 150/68  Ht 5\' 9"  (1.753 m)  Wt 162 lb (73.483 kg)  BMI 23.91 kg/m2  PHYSICAL EXAM: Pt is alert and oriented, pleasant elderly male in NAD HEENT: normal Neck: JVP - normal, carotids 2+= without bruits Lungs: CTA bilaterally CV: RRR without murmur or gallop Abd: soft, NT, Positive BS, no hepatomegaly Ext: no C/C/E, distal pulses intact and equal Skin: warm/dry no rash  EKG:  Normal sinus rhythm 70 beats per minute, first degree A-V block, voltage criteria for LVH.  ASSESSMENT AND PLAN: 1. Coronary artery disease status post CABG. The patient is stable without symptoms of angina. He's 14 years out from open heart surgery and hopefully will remain symptom-free. His medications are appropriate with aspirin, a statin drug, and the beta blocker.  2. Lower extremity peripheral  arterial disease with intermittent claudication. His ABIs are 0.85 on the right and 0.69 on the left. He has known bilateral SFA disease. I reviewed his duplex scan which demonstrated severely elevated velocities in the distal SFAs bilaterally. Considering his advanced age and chronic kidney disease with stable symptoms, I have recommended ongoing observation and medical therapy. He understands to contact me immediately if symptoms progress or if there is rest pain or  ulceration.  3. Hypertension. Blood pressure is controlled on amlodipine, losartan, and metoprolol.  4. Hyperlipidemia. The patient is on atorvastatin 20 mg daily.  5. Edema. Discussed sodium restriction. No evidence of congestive heart failure.  Sherren Mocha 02/17/2013 4:25 PM

## 2013-02-23 ENCOUNTER — Encounter (HOSPITAL_COMMUNITY): Payer: Medicare Other

## 2013-02-23 NOTE — Patient Instructions (Addendum)
error 

## 2013-03-16 DIAGNOSIS — M48061 Spinal stenosis, lumbar region without neurogenic claudication: Secondary | ICD-10-CM | POA: Diagnosis not present

## 2013-03-16 DIAGNOSIS — M25519 Pain in unspecified shoulder: Secondary | ICD-10-CM | POA: Diagnosis not present

## 2013-07-28 ENCOUNTER — Ambulatory Visit: Payer: Medicare Other | Admitting: Pulmonary Disease

## 2013-08-03 DIAGNOSIS — H35369 Drusen (degenerative) of macula, unspecified eye: Secondary | ICD-10-CM | POA: Diagnosis not present

## 2013-08-03 DIAGNOSIS — H35379 Puckering of macula, unspecified eye: Secondary | ICD-10-CM | POA: Diagnosis not present

## 2013-08-03 DIAGNOSIS — H353190 Nonexudative age-related macular degeneration, unspecified eye, stage unspecified: Secondary | ICD-10-CM | POA: Diagnosis not present

## 2013-08-13 ENCOUNTER — Ambulatory Visit: Payer: Medicare Other | Admitting: Pulmonary Disease

## 2013-08-26 ENCOUNTER — Other Ambulatory Visit (INDEPENDENT_AMBULATORY_CARE_PROVIDER_SITE_OTHER): Payer: Medicare Other

## 2013-08-26 ENCOUNTER — Ambulatory Visit (INDEPENDENT_AMBULATORY_CARE_PROVIDER_SITE_OTHER): Payer: Medicare Other | Admitting: Pulmonary Disease

## 2013-08-26 ENCOUNTER — Encounter: Payer: Self-pay | Admitting: Pulmonary Disease

## 2013-08-26 VITALS — BP 120/74 | HR 61 | Temp 98.6°F | Ht 69.0 in | Wt 172.0 lb

## 2013-08-26 DIAGNOSIS — I658 Occlusion and stenosis of other precerebral arteries: Secondary | ICD-10-CM | POA: Diagnosis not present

## 2013-08-26 DIAGNOSIS — I7389 Other specified peripheral vascular diseases: Secondary | ICD-10-CM | POA: Diagnosis not present

## 2013-08-26 DIAGNOSIS — D638 Anemia in other chronic diseases classified elsewhere: Secondary | ICD-10-CM

## 2013-08-26 DIAGNOSIS — M159 Polyosteoarthritis, unspecified: Secondary | ICD-10-CM

## 2013-08-26 DIAGNOSIS — I25810 Atherosclerosis of coronary artery bypass graft(s) without angina pectoris: Secondary | ICD-10-CM

## 2013-08-26 DIAGNOSIS — I10 Essential (primary) hypertension: Secondary | ICD-10-CM

## 2013-08-26 DIAGNOSIS — M545 Low back pain, unspecified: Secondary | ICD-10-CM

## 2013-08-26 DIAGNOSIS — E7800 Pure hypercholesterolemia, unspecified: Secondary | ICD-10-CM

## 2013-08-26 DIAGNOSIS — E119 Type 2 diabetes mellitus without complications: Secondary | ICD-10-CM

## 2013-08-26 DIAGNOSIS — M150 Primary generalized (osteo)arthritis: Secondary | ICD-10-CM

## 2013-08-26 DIAGNOSIS — N183 Chronic kidney disease, stage 3 unspecified: Secondary | ICD-10-CM

## 2013-08-26 DIAGNOSIS — I6529 Occlusion and stenosis of unspecified carotid artery: Secondary | ICD-10-CM

## 2013-08-26 DIAGNOSIS — I6523 Occlusion and stenosis of bilateral carotid arteries: Secondary | ICD-10-CM

## 2013-08-26 LAB — BASIC METABOLIC PANEL
BUN: 25 mg/dL — AB (ref 6–23)
CALCIUM: 9.3 mg/dL (ref 8.4–10.5)
CO2: 28 mEq/L (ref 19–32)
CREATININE: 1.8 mg/dL — AB (ref 0.4–1.5)
Chloride: 106 mEq/L (ref 96–112)
GFR: 37.38 mL/min — AB (ref >60.00)
Glucose, Bld: 129 mg/dL — ABNORMAL HIGH (ref 70–99)
Potassium: 4.7 mEq/L (ref 3.5–5.1)
Sodium: 140 mEq/L (ref 135–145)

## 2013-08-26 LAB — IBC PANEL
IRON: 84 ug/dL (ref 42–165)
Saturation Ratios: 27.3 % (ref 20.0–50.0)
TRANSFERRIN: 219.8 mg/dL (ref 212.0–360.0)

## 2013-08-26 LAB — CBC WITH DIFFERENTIAL/PLATELET
BASOS PCT: 0.5 % (ref 0.0–3.0)
Basophils Absolute: 0 10*3/uL (ref 0.0–0.1)
EOS PCT: 3.2 % (ref 0.0–5.0)
Eosinophils Absolute: 0.2 10*3/uL (ref 0.0–0.7)
HCT: 30.7 % — ABNORMAL LOW (ref 39.0–52.0)
Hemoglobin: 10.4 g/dL — ABNORMAL LOW (ref 13.0–17.0)
LYMPHS PCT: 19.4 % (ref 12.0–46.0)
Lymphs Abs: 1.3 10*3/uL (ref 0.7–4.0)
MCHC: 33.8 g/dL (ref 30.0–36.0)
MCV: 90.5 fl (ref 78.0–100.0)
Monocytes Absolute: 0.9 10*3/uL (ref 0.1–1.0)
Monocytes Relative: 13.7 % — ABNORMAL HIGH (ref 3.0–12.0)
NEUTROS PCT: 63.2 % (ref 43.0–77.0)
Neutro Abs: 4.2 10*3/uL (ref 1.4–7.7)
PLATELETS: 193 10*3/uL (ref 150.0–400.0)
RBC: 3.39 Mil/uL — AB (ref 4.22–5.81)
RDW: 13.5 % (ref 11.5–15.5)
WBC: 6.7 10*3/uL (ref 4.0–10.5)

## 2013-08-26 LAB — FERRITIN: FERRITIN: 103.9 ng/mL (ref 22.0–322.0)

## 2013-08-26 LAB — TSH: TSH: 3.5 u[IU]/mL (ref 0.35–4.50)

## 2013-08-26 LAB — HEMOGLOBIN A1C: Hgb A1c MFr Bld: 6.5 % (ref 4.6–6.5)

## 2013-08-26 NOTE — Progress Notes (Signed)
Subjective:    Patient ID: Henry [name], male    DOB: 1924-06-18, 80 y.o.   MRN: [id number]  HPI 79 y/o WM here for a follow up visit... he has mult med problems as listed and is followed by DrBBrodie and DrPerry as well... he gets his meds filled at the [facility] which makes for a complicated array of perscribed meds and substituted alternatives...  ~  August 19, 2012:  89yr ROV & Henry [name] returns for pre-op medical clearance for back surgery planned by DrBrooks at [facility]; He continues to get his general medical care & meds from the [facility], he is followed by [name] for [employer]; We reviewed the following medical problems during today's office visit >> Note- he did not bring med bottles (he does not know his medications), med list, or prev labs from the [facility]...    He was [facility] by [facility] 2/13 - 04/13/12 w/ dizziness, n/v, dehydration- felt to be due to a gastroenteritis; resolved w/ supportive therapy, IV fluids, rec for vestib therapy as outpt; he never f/u w/ anyone after this adm... [place] 2/14 showed sm vessel dis, vasc calcif, global atrophy (no hemorrhage or large area infarct);  MRI Brain confirmed mod advanced atrophy, sm vessel dis, no infarct or hem...     HBP> on Metop25/d, Amlod10, Losar25, Lasix20;  BP= 160/64 but he is in pain & ?took his meds today; he denies CP, palpit, ch in SOB, edema, etc; asked to incr Metop25Bid...    ASHD> on ASA81; followed by [name] & last seen 12/13; [facility]; he gets back pain & claud w/ mod activ- no angina; last NuclearStressTest 9/08 was normal without ischemia and EF=68%; can't find Echo in epic or Centric...    Carotid Art Dis> on ASA81; he denies cerebral ischemic symptoms; CT/ MRI Brain 2/14 as above; last CDoppler 1/13 showed mild heterogeneous plaque bilat w/ 40-59% bilat ICAstenoses & f/u is overdue...     Periph Vasc Dis> on ASA81; he has stable claud w/ ambulation but lim by back pain now; last Art Dopplers 1/13 showed  ABIs .92R & .80L, TBIs were abn but w/o risk of tissue loss; overdue for yearly f/u studies...    CHOL> on Lip20 (off prev Crestor); Labs done at [facility] but he didn't bring reports; he is not fasting today for recheck...    DM> on diet alone (off prev Metform); Labs 2/14 Hosp w/ BS= 150-200+ range; now BS=132, A1c=6.6; he follows low carb diet...    Renal Insuffic> he knows to avoid NSAIDs; Labs 2/14 w/ Creat= 1.4-1.6 range; Labs today showed BUN= 21, Creat= 1.7    DJD, LBP w/ spinal stenosis> on Tramadol50 prn; followed by DrRamos=> DrBrooks at [facility]; he has had PT, he rates back pain 10/10 w/ radiation to right leg; they did MRI w/ report of sp stenosis severe at L4-5; he wants relief & they plan surg if cleared.    Anemia> on FeSO4 325mg /d; Labs 2/14 w/ Hg= 10-11 range; Labs now show Hg= 11.3 We reviewed prob list, meds, xrays and labs> see below for updates >>   CXR 6/14 showed normal heart size, tortuous Ao, mild COPD changes w/ clear lungs/ NAD, DJD in Tspine...   LABS 6/14:  Chems- ok x BS=132 A1c=6.6 Creat=1.7;  CBC- Hg=11.3;  TSH=5.84;  B12=530;  BNP=198...  ~  February 10, 2013:  41mo ROV & Henry [name] reports a good interval- s/p lumbar decompression & fusion, no new complaints  or concerns, he is under stress wife is 71 w/ Alz... We reviewed the following medical problems during today's office visit >>     HBP> on Metop25Bid, Amlod10, Losar25-3/d, Lasix20;  BP= 144/60 & he denies CP, palpit, ch in SOB, edema, etc...    ASHD> on ASA81; known CAD & s/p CABG 2000; followed by [name] & last seen 6/14- cleared for back surg after norm Myoview 7/14; he gets back pain & claud w/ mod activ- no angina...    Carotid Art Dis> on ASA81; he denies cerebral ischemic symptoms; CT/ MRI Brain 2/14 w/ atrophy & sm vessel dis; last CDoppler 1/13 showed mild heterogeneous plaque bilat w/ 40-59% bilat ICAstenoses & f/u is overdue...     Periph Vasc Dis> on ASA81; he has stable claud w/ ambulation; last Art  Dopplers 1/13 showed ABIs .92R & .80L, TBIs were abn but w/o risk of tissue loss; overdue for yearly f/u studies...    CHOL> on Lip20 (off prev Crestor); Labs 12/14 showed TChol 127, TG 56, HDL 41, LDL 75...    DM> on diet alone (off prev Metform); Labs 12/14 showed BS= 131, A1c=6.9; he has gained 13# & we reviewed diet, exercise, etc....    Renal Insuffic> he knows to avoid NSAIDs; Labs 12/14 w/ Creat= 1.8, stable...    DJD, LBP w/ spinal stenosis> on Tramadol50 prn; followed by DrRamos=> DrBrooks at [facility]; they did MRI w/ report of sp stenosis severe at L4-5; surg 7/14 w/ lumbar decompression & fusion L4-5...    Anemia> on FeSO4 325mg /d; Labs 2/14 w/ Hg= 10-11 range; Labs 12/14 showed Hg= 10.0 We reviewed prob list, meds, xrays and labs> see below for updates >> he had the 2014 Flu vaccine in Oct...  LABS 12/14:  FLP- at goals on Lip20;  Chems- ok x BS=131, A1c=6.9, Cr=1.8;  CBC= Hg= 10.0;  TSH=8.74 & rec to start Synthroid50...  ~  August 26, 2013:  35mo ROV & Henry [name]'s CC is tightness in his legs w/ ambulation; known ASPVD followed by [name] w/ intermittent claudication, but no rest pain & no LE ulcers/ lesions; he notes that the discomfort is similar over the past few yrs- no real change & not progressive; he remains active- shopping, yard, stairs, etc...     BP controlled on Metop25Bid, Amlod10, Losar25-3/d, Lasix20;  BP= 120/74 7 he denies CP, palpit, dyspnea, etc; Labs showed K=4.7, BUN=25, Cr=1.8 stable...    He has ASHD & ASPVD on ASA81; as noted he is active & denies angina etc; he saw [name] 12/14 & stable, continue medical therapy & exercise...    He remains on Lip20 for his Chol w/ all parameters wnl on last FLP...    He has DM controlled on diet alone;weight stable at 171#, BMI=25-6; Labs today showed BS=129, A1c=6.5; continue diet management...    He has a chronic anemia & f/u labs today showed Hg=10.4 (stable) w/ MCV=91, Fe=84 (27%), Ferritin 104; he continues on MVI, Fe, etc...     Finally- during our last visit TSH was 8.74 (up from 5.84) so we decided to start Synthroid50; somehow he says the [place] stopped this med & he didn't call to check w/ [place]; he feels well, clinically euthyroid, good energy he says; we dicided to recheck his TSH 1st & it returns today at 3.50 so we will leave it off for now 7 monitor carefully going forward...  We reviewed prob list, meds, xrays and labs> see below for updates >>   LABS 7/15:  Chems- ok x BS=129, A1c=6.5, BUN=25, Cr=1.8;  CBC- ok x Hg=10.4, Fe=84 (27%sat), Ferritin=104;  TSH=3.50...          Problem List:  HYPERTENSION (ICD-401.9) - controlled on 4 meds:  METOPROLOL 25mg Bid, AMLODIPINE 10mg /d, LOSARTAN 25mg /d, and HCT 25mg - 1/2 qd... BP=128/68 and doing well except he notes sl dizzy in the AM... denies HA, fatigue, visual changes, CP, palipit, syncope, dyspnea, edema, etc... ~  2DEcho 1996 showed mild LVH, AoV sclerosis & mild AI, EF= 60%... ~  labs 11/11 showed BUN=36, Creat=2.1, K=5.1.Marland KitchenMarland Kitchen rec to stop HCTZ... ~  [facility] subseq re-started 12.5mg  HCTZ in the interim due to edema... ~  6/14:  on Metop25/d, Amlod10, Losar25, Lasix20;  BP= 160/64 but he is in pain & ?took his meds today; he denies CP, palpit, ch in SOB, edema, etc; asked to incr Metop25Bid.  ~  CXR 6/14 showed norm heart size, atherosclerotic & tort Ao, emphysematous changes in upper lobes & scarring in lower lobes, DJD in spine, NAD... ~  12/14: on Metop25Bid, Amlod10, Losar25-3/d, Lasix20;  BP= 144/60 & he denies CP, palpit, ch in SOB, edema, etc. ~  10-01-22: BP controlled on Metop25Bid, Amlod10, Losar25-3/d, Lasix20;  BP= 120/74 7 he denies CP, palpit, dyspnea, etc; Labs showed K=4.7, BUN=25, Cr=1.8 stable.  ARTERIOSCLEROTIC HEART DISEASE (ICD-414.00) - on ASA 81mg /d... long time pt of DrJoeLeBauer & followed by DrBrodie now... s/p CABG in 2000 by DrBartle (for L main dis)... he is active & w/o angina, palpit, change in SOB/ DOE, etc... ~  NuclearStressTest 9/08 was normal  without ischemia and EF=68%... ~  12/13: he saw [name] for f/u CAD, s/p CABG, PAD, Carotid Art dis> no angina, bilat calf pain w/ ambulation but felt to be stable; he adjusted med[facility], but [facility] after that... ~  EKG 6/14 showed SBrady, rate56, 1st degree AVB, otherw wnl... ~  7/14:  Myoview showed normal wall motion & EF=61%, no CP or EKG changes...  CAROTID ARTERY DISEASE>  PERIPHERAL VASCULAR DISEASE (ICD-443.9) - on ASA daily and he has bilat femoral bruits... he has seen DrDowney in the past (now [name]) w/ exerc program recommended... he walks regularly w/ stable claud symptoms, no sores or lesions on LE's... ~  ABI's 10/08 were normal bilat = 1.0.Marland KitchenMarland Kitchen waveforms were biphasic however (no change from 2005). ~  CDoppler's 10/08 showed mild bilat carotid dis w/ 40-59% RICA stenosis, and [id number] LICA stenosis. ~  CDoppler's 10/09 showed the same= stable. ~  CDoppler's 11/10 showed stable mild carotid dis bilat> [id number] RICA & [id number] LICA stenoses. ~  ABI's 11/10 showed prob mod severe >50% distal right SFA stenosis, & mod dis in left SFA, ABI's=0.9 ~  Podiarty sent him to [facility] for ArtDopplers 9/11> ABIs 0.82 R & 0.74L w/ bilat SFA dis L>R, toe pressures adeq for healing... ~  6/14: on ASA81; he denies cerebral ischemic symptoms; CT/ MRI Brain 2/14 showed sm vessel dis, vasc calcif, global atrophy (no hemorrhage or large area infarct); last CDoppler 1/13 showed mild heterogeneous plaque bilat w/ 40-59% bilat ICAstenoses... ~  6/14: on ASA81; he has stable claud w/ ambulation but lim by back pain now; last Art Dopplers 1/13 showed ABIs .92R & .80L, TBIs were abn but w/o risk of tissue loss; overdue for yearly f/u studies of Carotids & ABIs... ~  2022-10-01: He has ASHD & ASPVD on ASA81; as noted he is active & denies angina etc; he saw [name] 12/14 & stable, continue medical therapy & exercise.  HYPERCHOLESTEROLEMIA (  ICD-272.0) - prev controlled on Simva40 + diet, the [facility] CRESTOR  10mg /d. ~  [place] 2/09 on Simva40 showed Tchol 169, TG 206, HDL 37, LDL 96 ~  FLP 4/10 on Simva40 showed TChol 146, TG 145, HDL 36, LDL 82 ~  FLP 10/10 on Simva40 showed TChol 166, TG 130, HDL 38, LDL 102 ~  FLP 4/11 on Simva40 showed TChol 138, TG 87, HDL 40, LDL 80... [facility] subseq ch to Cres10 ~  FLP 11/11 on Cres10 showed TChol 147, TG 161, HDL 34, LDL 81 ~  6/12:  He indicates interval labs done by the [facility] but he didn't bring copies for [place] to review... ~  6/14: on Lip20 (off prev Crestor); Labs done at [facility] but he didn't bring reports; he is not fasting today for recheck.  ~  [place] 12/14 on Lip20 showed TChol 127, TG 56, HDL 41, LDL 75   DIABETES MELLITUS (ICD-250.00) - he's struggled w/ diet Rx... wt stable ~175 range... prev on Metformin 500Bid but [facility] changed to GLYBURIDE 5mg /d, then decr to 1/2 tab daily due to side effects & he stopped due to hypogly. ~  labs 10/08 showed FBS= 134, and HgA1c= 5.4... ~  labs 6/09 showed BS= 101, HgA1c= 6.4.Marland KitchenMarland Kitchen monofilament test is normal. ~  Eye eval 1/10 at [facility] showed no DM retinopathy... ~  labs 4/10 on MetformBid showed BS= 106, A1c= 6.8, Urine Microalb= sl incr @ 7.8mg % ~  labs 10/10 showed BS= 119, A1c= 6.5...  NOTE: 2/11 [facility] ch to Glybur5mg - 1/2 daily. ~  labs 4/11 on Glyb2.5 showed BS= 97, A1c= 6.6.Marland KitchenMarland Kitchen pt subseq stopped Glybur due to low sugars. ~  labs 11/11 off meds showed BS= 149, A1c= 6.7.Marland KitchenMarland Kitchen coninue diet Rx. ~  6/12:  He indicates interim labs done by the [facility] but he didn't bring copies for [place] to review... ~  6/14: on diet alone (off prev Metform); Labs 2/14 Hosp w/ BS= 150-200+ range; now BS=132, A1c=6.6; he follows low carb diet ~  Labs 12/14 on diet alone (weight up 13# to 173#) showed BS= 131, A1c= 6.9 ~  7/15: He has DM controlled on diet alone;weight stable at 171#, BMI=25-6; Labs today showed BS=129, A1c=6.5; continue diet management.  BORDERLINE TSH >> ~  Serial labs showed rising TSH> 3.57 => 5.84 => 8.74 (12/14) and we decided to  start Synthroid24mcg/d;  [facility]how the [facility] told [facility] to stop this med & he did not call to check w/ [place]... ~  7/15  Follow up TSH= 3.50 not on meds, clinically euthyroid, good energy, etc;  we will follow closely...   RENAL INSUFFICIENCY (ICD-588.9) - Creat 1.8-2.0 in the past... ~  labs 6/09 showed BUN= 31, Creat= 1.6... ~  labs 9/09 showed BUN= 25, Creat= 1.6, K= 4.5 ~  labs 4/10 showed BUN= 26, Creat= 1.6, K= 5.0 ~  labs 10/10 showed BUN= 21, Creat= 1.6 ~  labs 4/11 showed BUN= 30, Creat= 1.9..Marland Kitchen advised to stay well hydrated. ~  labs 11/11 showed BUN= 36, Creat= 2.1.Marland KitchenMarland Kitchen rec to stop the HCTZ ~  6/12:  He indicates that the [facility] restarted HCTZ 12.5mg /d & they are following labs... ~  6/14: he knows to avoid NSAIDs; Labs 2/14 w/ Creat= 1.4-1.6 range; Labs today showed BUN= 21, Creat= 1.7... ~  Labs 12/14 showed BUN= 23, Creat= 1.8 ~  Labs 7/15 showed BUN= 25, Cr= 1.8  DEGENERATIVE JOINT DISEASE (ICD-715.90) LOW BACK PAIN >> he has severe sp stenosis  L4-5 per MRI at [facility] 6/14... ~  Notes and Records fro [facility]- DrRamos, DrBrooks> reviewed, they plan surgery if cleared fro CV standpoint...  Hx of ANEMIA OF CHRONIC DISEASE (ICD-285.29) - eval by DrGranfortuna w/ ACD secondary to his renal insuffic and Rx w/ aranesp periodically... nothing else found on his extensive eval... he was taking Ferrous Gluconate daily (it no longer makes him tired)... ~  labs 2/09 w/ Hg= 11.2.Marland Kitchen. ~  labs 6/09 w/ Hg= 11.0.Marland Kitchen. rec> OTC Fe prep but he never got it! ~  labs 9/09 showed Hg= 10.5, Fe= 81 ~  labs 4/10 showed Hg= 10.5, MCV= 92, Fe= 77 (22%sat), B12= 590, Folate>20... rec> OTC Fe daily. ~  labs 10/10 showed Hg= 10.3, Fe= 88 ~  labs 4/11 showed Hg= 10.9,  Fe= 58... continue Ferrous Gluc daily w/ VitC ~  labs 11/11 showed Hg= 11.6 ~  6/12:  Again he indicated that the [facility] is following his labs, aske to bring copies for [place] to review. ~  6/14: on FeSO4 325mg /d; Labs 2/14 w/ Hg= 10-11 range;  Labs now show Hg= 11.3 ~  Labs 12/14 showed Hg= 10.0 ~  7/15: He has a chronic anemia & f/u labs today showed Hg=10.4 (stable) w/ MCV=91, Fe=84 (27%), Ferritin 104; he continues on MVI, Fe, etc.   Past Surgical History  Procedure Laterality Date  . Cataract surgery    . Coronary artery bypass graft  2000    by Dr. Cyndia Bent  . Tonsillectomy    . Colonoscopy      Hx: of  . Lumbar laminectomy/decompression microdiscectomy N/A 09/10/2012    Procedure: LUMBAR DECOMPRESSION,  IN SITU FUSION LUMBAR 4-5;  Surgeon: Melina Schools, MD;  Location: [place];  Service: Orthopedics;  Laterality: N/A;    Outpatient Encounter Prescriptions as of 08/26/2013  Medication Sig  . amLODipine (NORVASC) 10 MG tablet Take 10 mg by mouth every evening.   Marland Kitchen aspirin EC 81 MG tablet Take 81 mg by mouth every morning.   Marland Kitchen atorvastatin (LIPITOR) 20 MG tablet Take 20 mg by mouth every evening.   . Calcium Carbonate-Vitamin D (CALCIUM 600/VITAMIN D PO) Take 1 tablet by mouth 2 (two) times daily.   . ferrous gluconate (FERGON) 325 MG tablet Take 325 mg by mouth daily with breakfast.   . furosemide (LASIX) 20 MG tablet Take 20 mg by mouth daily as needed for fluid or edema.   Marland Kitchen glucose blood (ACCU-CHEK AVIVA PLUS) test strip Use as instructed  . Lancets (ACCU-CHEK MULTICLIX) lancets Use as instructed  . losartan (COZAAR) 25 MG tablet Take 75 mg by mouth every evening.   . metoprolol tartrate (LOPRESSOR) 25 MG tablet Take 1 tablet (25 mg total) by mouth 2 (two) times daily.  . Multiple Vitamins-Minerals (CENTRUM SILVER PO) Take 1 tablet by mouth every morning.   . Multiple Vitamins-Minerals (PRESERVISION AREDS 2 PO) Take 1 tablet by mouth every evening.  . [DISCONTINUED] Ascorbic Acid (VITAMIN C PO) Take 90 mg by mouth every morning. `  . [DISCONTINUED] levothyroxine (SYNTHROID) 50 MCG tablet Take 1 tablet (50 mcg total) by mouth daily before breakfast.    No Known Allergies   Review of Systems        See HPI - all other  systems neg except as noted... The patient complains of decreased hearing, dyspnea on exertion, and difficulty walking.  The patient denies anorexia, fever, weight loss, weight gain, vision loss, hoarseness, chest pain, syncope, peripheral edema, prolonged cough,  headaches, hemoptysis, abdominal pain, melena, hematochezia, severe indigestion/heartburn, hematuria, incontinence, muscle weakness, suspicious skin lesions, transient blindness, depression, unusual weight change, abnormal bleeding, enlarged lymph nodes, and angioedema.    Objective:   Physical Exam     WD, WN, 80 y/o WM in NAD... GENERAL:  Alert & oriented; pleasant & cooperative... HEENT:  [place]/AT, EOM-wnl, PERRLA, EACs-clear, TMs-wnl, NOSE-clear, THROAT-clear & wnl. NECK:  Supple w/ fairROM; no JVD; normal carotid impulses w/ faint right bruit; no thyromegaly or nodules palpated; no lymphadenopathy. CHEST:  Clear to P & A; without wheezes/ rales/ or rhonchi heard... HEART:  Regular, gr 1/6 SEM without rubs or gallops heard... ABDOMEN:  Soft & nontender; normal bowel sounds; no organomegaly or masses detected. EXT: without deformities, mod arthritic changes; no varicose veins/ venous insuffic/ or edema. he has bilat fem art bruits & minor pressure area betw right 4th & 5th toes... NEURO:  CN's intact;  no focal neuro deficits... DERM:  Lesion on medial side of right small toe, sl tender...  RADIOLOGY DATA:  Reviewed in the EPIC EMR & discussed w/ the patient...  LABORATORY DATA:  Reviewed in the EPIC EMR & discussed w/ the patient...   Assessment & Plan:    HBP>  BP is OK on the current regimen; continue same; he is also followed by the [place]...  ASHD/ CAD>  Followed by [name] for [facility] & the [facility]; continue current meds...  ASPVD- Carotid art dis & PAD>  Decr ABIs & Carotid dis> on ASA 81mg /d; followed by [name] et al...  CHOL>  On Lipitor20 and FLP is at goals...  DM>  On diet alone & labs also followed by there  [facility]; labs today showed BS=129, A1c=6.5 (OK)...  Renal Insuffic>  Creat stable in the 1.8 range on current meds, better off NSAIDs & HCTZ...  Anemia>  Hg seems stable in the 10-11 range w/ ACD...   Patient's Medications  New Prescriptions   No medications on file  Previous Medications   AMLODIPINE (NORVASC) 10 MG TABLET    Take 10 mg by mouth every evening.    ASPIRIN EC 81 MG TABLET    Take 81 mg by mouth every morning.    ATORVASTATIN (LIPITOR) 20 MG TABLET    Take 20 mg by mouth every evening.    CALCIUM CARBONATE-VITAMIN D (CALCIUM 600/VITAMIN D PO)    Take 1 tablet by mouth 2 (two) times daily.    FERROUS GLUCONATE (FERGON) 325 MG TABLET    Take 325 mg by mouth daily with breakfast.    FUROSEMIDE (LASIX) 20 MG TABLET    Take 20 mg by mouth daily as needed for fluid or edema.    GLUCOSE BLOOD (ACCU-CHEK AVIVA PLUS) TEST STRIP    Use as instructed   LANCETS (ACCU-CHEK MULTICLIX) LANCETS    Use as instructed   LOSARTAN (COZAAR) 25 MG TABLET    Take 75 mg by mouth every evening.    METOPROLOL TARTRATE (LOPRESSOR) 25 MG TABLET    Take 1 tablet (25 mg total) by mouth 2 (two) times daily.   MULTIPLE VITAMINS-MINERALS (CENTRUM SILVER PO)    Take 1 tablet by mouth every morning.    MULTIPLE VITAMINS-MINERALS (PRESERVISION AREDS 2 PO)    Take 1 tablet by mouth every evening.  Modified Medications   No medications on file  Discontinued Medications   ASCORBIC ACID (VITAMIN C PO)    Take 90 mg by mouth every morning. `   LEVOTHYROXINE (SYNTHROID) 50 MCG  TABLET    Take 1 tablet (50 mcg total) by mouth daily before breakfast.

## 2013-08-26 NOTE — Patient Instructions (Signed)
Today we updated your med list in our EPIC system...    Continue your current medications the same...    Don't hesitate to call if you ever have any questions about your meds...  Today we re-checked your blood work...    We will contact you w/ the results when available...   Continue your exercise program as we discussed..   Let's plan a follow up visit in 51mo, sooner if needed for problems.Marland KitchenMarland Kitchen

## 2013-09-01 DIAGNOSIS — C44211 Basal cell carcinoma of skin of unspecified ear and external auricular canal: Secondary | ICD-10-CM | POA: Diagnosis not present

## 2013-09-01 DIAGNOSIS — D235 Other benign neoplasm of skin of trunk: Secondary | ICD-10-CM | POA: Diagnosis not present

## 2013-09-01 DIAGNOSIS — L570 Actinic keratosis: Secondary | ICD-10-CM | POA: Diagnosis not present

## 2013-09-29 DIAGNOSIS — Z85828 Personal history of other malignant neoplasm of skin: Secondary | ICD-10-CM | POA: Diagnosis not present

## 2014-02-02 DIAGNOSIS — Z08 Encounter for follow-up examination after completed treatment for malignant neoplasm: Secondary | ICD-10-CM | POA: Diagnosis not present

## 2014-02-02 DIAGNOSIS — L821 Other seborrheic keratosis: Secondary | ICD-10-CM | POA: Diagnosis not present

## 2014-02-02 DIAGNOSIS — Z85828 Personal history of other malignant neoplasm of skin: Secondary | ICD-10-CM | POA: Diagnosis not present

## 2014-02-17 ENCOUNTER — Ambulatory Visit (HOSPITAL_COMMUNITY): Payer: Medicare Other | Attending: Cardiovascular Disease | Admitting: Cardiology

## 2014-02-17 DIAGNOSIS — I739 Peripheral vascular disease, unspecified: Secondary | ICD-10-CM | POA: Insufficient documentation

## 2014-02-17 DIAGNOSIS — E119 Type 2 diabetes mellitus without complications: Secondary | ICD-10-CM | POA: Insufficient documentation

## 2014-02-17 DIAGNOSIS — E785 Hyperlipidemia, unspecified: Secondary | ICD-10-CM | POA: Insufficient documentation

## 2014-02-17 DIAGNOSIS — I10 Essential (primary) hypertension: Secondary | ICD-10-CM | POA: Insufficient documentation

## 2014-02-17 DIAGNOSIS — Z951 Presence of aortocoronary bypass graft: Secondary | ICD-10-CM | POA: Insufficient documentation

## 2014-02-17 DIAGNOSIS — I2510 Atherosclerotic heart disease of native coronary artery without angina pectoris: Secondary | ICD-10-CM | POA: Diagnosis not present

## 2014-02-17 NOTE — Progress Notes (Signed)
ABI performed  

## 2014-02-25 ENCOUNTER — Ambulatory Visit (INDEPENDENT_AMBULATORY_CARE_PROVIDER_SITE_OTHER): Payer: Medicare Other | Admitting: Cardiovascular Disease

## 2014-02-25 ENCOUNTER — Encounter: Payer: Self-pay | Admitting: Cardiovascular Disease

## 2014-02-25 VITALS — BP 144/62 | HR 57 | Ht 69.0 in | Wt 168.4 lb

## 2014-02-25 DIAGNOSIS — I6523 Occlusion and stenosis of bilateral carotid arteries: Secondary | ICD-10-CM | POA: Diagnosis not present

## 2014-02-25 DIAGNOSIS — I25810 Atherosclerosis of coronary artery bypass graft(s) without angina pectoris: Secondary | ICD-10-CM | POA: Diagnosis not present

## 2014-02-25 MED ORDER — AMLODIPINE BESYLATE 5 MG PO TABS: 5.0000 mg | ORAL_TABLET | Freq: Every evening | ORAL | Status: DC

## 2014-02-25 NOTE — Patient Instructions (Addendum)
Your physician has recommended you make the following change in your medication:  1) DECREASE Amlodipine to 5 mg once a day  Your physician wants you to follow-up in: 1 year with Dr. Burt Knack. You will receive a reminder letter in the mail two months in advance. If you don't receive a letter, please call our office to schedule the follow-up appointment.   Your physician has requested that you regularly monitor and record your blood pressure readings at home. Please use the same machine at the same time of day to check your readings and record them to bring to your follow-up visit.

## 2014-02-25 NOTE — Progress Notes (Signed)
Background: The patient is followed for coronary artery disease with remote CABG. He's also followed for peripheral arterial disease with intermittent claudication, hypertension, hyperlipidemia, and chronic kidney disease.  HPI:  80 year old gentleman presenting for follow-up evaluation. He was last seen 1 year ago.  Overall he is doing okay. He continues to care for his wife who has fairly advanced dementia. He denies chest pain or chest pressure. He's had no shortness of breath. He does have some leg discomfort with a tightness in his lower legs with walking. He is generally able to continue walking and his symptoms dissipate. I also resolved with rest. He's had no resting leg pain or ulceration. He complains of lower extremity swelling. He denies orthopnea, PND, or heart palpitations.  Studies:  ABIs 02/18/2014: 0.79 on the right, 0.71 on the left. Stable from previous studies.  Lipid Panel     Component Value Date/Time   CHOL 127 02/11/2013 0929   TRIG 56.0 02/11/2013 0929   HDL 40.60 02/11/2013 0929   CHOLHDL 3 02/11/2013 0929   VLDL 11.2 02/11/2013 0929   LDLCALC 75 02/11/2013 0929   LDLDIRECT 96.2 04/04/2007 1049   Myoview scan 09/03/2012: Impression Exercise Capacity: Lexiscan with no exercise. BP Response: Normal blood pressure response. Clinical Symptoms: No chest pain. ECG Impression: No significant ST segment change suggestive of ischemia. Comparison with Prior Nuclear Study: No images to compare  Overall Impression: Normal stress nuclear study.  LV Ejection Fraction: 61%. LV Wall Motion: NL LV Function; NL Wall Motion  Outpatient Encounter Prescriptions as of 02/25/2014  Medication Sig  . amLODipine (NORVASC) 10 MG tablet Take 10 mg by mouth every evening.   Marland Kitchen aspirin EC 81 MG tablet Take 81 mg by mouth every morning.   Marland Kitchen atorvastatin (LIPITOR) 20 MG tablet Take 20 mg by mouth every evening.   . Calcium Carbonate-Vitamin D (CALCIUM 600/VITAMIN D PO) Take  1 tablet by mouth 2 (two) times daily.   . ferrous gluconate (FERGON) 325 MG tablet Take 325 mg by mouth daily with breakfast.   . furosemide (LASIX) 20 MG tablet Take 20 mg by mouth daily as needed for fluid or edema.   Marland Kitchen glucose blood (ACCU-CHEK AVIVA PLUS) test strip Use as instructed  . Lancets (ACCU-CHEK MULTICLIX) lancets Use as instructed  . losartan (COZAAR) 100 MG tablet Take 100 mg by mouth daily.  . metoprolol tartrate (LOPRESSOR) 25 MG tablet Take 1 tablet (25 mg total) by mouth 2 (two) times daily.  . Multiple Vitamins-Minerals (CENTRUM SILVER PO) Take 1 tablet by mouth every morning.   . Multiple Vitamins-Minerals (PRESERVISION AREDS 2 PO) Take 1 tablet by mouth every evening.  . [DISCONTINUED] losartan (COZAAR) 25 MG tablet Take 75 mg by mouth every evening.     No Known Allergies  Past Medical History  Diagnosis Date  . Hypertension   . CAD of autologous bypass graft   . Carotid artery disease   . PVD (peripheral vascular disease) with claudication   . Hypercholesteremia   . Renal insufficiency   . DJD (degenerative joint disease)   . History of anemia of chronic disease   . Diabetes mellitus without complication     family history is not on file.   ROS: Negative except as per HPI  BP 144/62 mmHg  Pulse 57  Ht 5\' 9"  (1.753 m)  Wt 168 lb 6.4 oz (76.386 kg)  BMI 24.86 kg/m2  PHYSICAL EXAM: Pt is alert and oriented, NAD HEENT: normal Neck: JVP -  normal, carotids 2+= without bruits Lungs: CTA bilaterally CV: RRR without murmur or gallop Abd: soft, NT, Positive BS, no hepatomegaly Ext: 2+ pretibial edema bilaterally, distal pulses intact and equal Skin: warm/dry no rash  EKG:  Sinus bradycardia 57 bpm, first-degree AV block, moderate voltage criteria for LVH maybe normal variant.  ASSESSMENT AND PLAN: 1. Coronary artery disease status post CABG, without anginal symptoms. The patient is stable and will continue on his current medical program which was  reviewed today.  2. Essential hypertension. Blood pressure is controlled. He is having significant edema and I suspect this is in part due to amlodipine. I recommended he reduce this to 5 mg daily. He will monitor his blood pressure at home and we will call him in a few weeks to see how his readings are.  3. Lower extremity peripheral arterial disease with intermittent claudication. ABIs are stable. Will continue observation and medical treatment. Recent ABIs were reviewed today.  4. Hyperlipidemia. Continues on atorvastatin 20 mg daily.  5. Chronic kidney disease, stage III. GFR is about 35. He is on losartan. Meds were reviewed and I do not see any nephrotoxic agents.  Sherren Mocha, MD 02/25/2014 2:51 PM

## 2014-03-01 ENCOUNTER — Encounter: Payer: Self-pay | Admitting: Pulmonary Disease

## 2014-03-01 ENCOUNTER — Ambulatory Visit (INDEPENDENT_AMBULATORY_CARE_PROVIDER_SITE_OTHER): Payer: Medicare Other | Admitting: Pulmonary Disease

## 2014-03-01 ENCOUNTER — Ambulatory Visit (INDEPENDENT_AMBULATORY_CARE_PROVIDER_SITE_OTHER)
Admission: RE | Admit: 2014-03-01 | Discharge: 2014-03-01 | Disposition: A | Discharge status: Home / Self Care | Payer: Medicare Other | Source: Ambulatory Visit | Attending: Pulmonary Disease | Admitting: Pulmonary Disease

## 2014-03-01 VITALS — BP 124/68 | HR 67 | Temp 97.9°F | Ht 69.0 in | Wt 169.0 lb

## 2014-03-01 DIAGNOSIS — E7800 Pure hypercholesterolemia, unspecified: Secondary | ICD-10-CM

## 2014-03-01 DIAGNOSIS — D638 Anemia in other chronic diseases classified elsewhere: Secondary | ICD-10-CM

## 2014-03-01 DIAGNOSIS — M545 Low back pain, unspecified: Secondary | ICD-10-CM

## 2014-03-01 DIAGNOSIS — N183 Chronic kidney disease, stage 3 unspecified: Secondary | ICD-10-CM

## 2014-03-01 DIAGNOSIS — I10 Essential (primary) hypertension: Secondary | ICD-10-CM

## 2014-03-01 DIAGNOSIS — I739 Peripheral vascular disease, unspecified: Secondary | ICD-10-CM | POA: Diagnosis not present

## 2014-03-01 DIAGNOSIS — M150 Primary generalized (osteo)arthritis: Secondary | ICD-10-CM

## 2014-03-01 DIAGNOSIS — E538 Deficiency of other specified B group vitamins: Secondary | ICD-10-CM | POA: Diagnosis not present

## 2014-03-01 DIAGNOSIS — F419 Anxiety disorder, unspecified: Secondary | ICD-10-CM

## 2014-03-01 DIAGNOSIS — E119 Type 2 diabetes mellitus without complications: Secondary | ICD-10-CM

## 2014-03-01 DIAGNOSIS — I70219 Atherosclerosis of native arteries of extremities with intermittent claudication, unspecified extremity: Secondary | ICD-10-CM

## 2014-03-01 DIAGNOSIS — J449 Chronic obstructive pulmonary disease, unspecified: Secondary | ICD-10-CM | POA: Diagnosis not present

## 2014-03-01 DIAGNOSIS — Z23 Encounter for immunization: Secondary | ICD-10-CM

## 2014-03-01 DIAGNOSIS — I25810 Atherosclerosis of coronary artery bypass graft(s) without angina pectoris: Secondary | ICD-10-CM | POA: Diagnosis not present

## 2014-03-01 DIAGNOSIS — M159 Polyosteoarthritis, unspecified: Secondary | ICD-10-CM

## 2014-03-01 DIAGNOSIS — I6523 Occlusion and stenosis of bilateral carotid arteries: Secondary | ICD-10-CM

## 2014-03-01 NOTE — Patient Instructions (Signed)
Today we updated your med list in our EPIC system...    Continue your current medications the same...  Today we gave you the 2015 Flu vaccine...  Today we did your follow up CXR... Please return to our lab one morning this week for your follow up FASTING blood work...    We will contact you w/ the results when available...   Remember> no salt in diet & wear good elastic support hose...  Call for any questions...  Let's plan a follow up visit in 13mo, sooner if needed for problems.Marland KitchenMarland Kitchen

## 2014-03-01 NOTE — Progress Notes (Signed)
Subjective:    Patient ID: Henry [name], male    DOB: [birth year], 81 y.o.   MRN: [id number]  HPI 81 y/o WM here for a follow up visit... he has mult med problems as listed and is followed by DrBBrodie and DrPerry as well... he gets his meds filled at the [facility] which makes for a complicated array of perscribed meds and substituted alternatives...  ~  August 19, 2012:  64yr ROV & Obed returns for pre-op medical clearance for back surgery planned by DrBrooks at [facility]; He continues to get his general medical care & meds from the [facility], he is followed by [name] for [employer]; We reviewed the following medical problems during today's office visit >> Note- he did not bring med bottles (he does not know his medications), med list, or prev labs from the [facility]...    He was [facility] 2/13 - 04/13/12 w/ dizziness, n/v, dehydration- felt to be due to a gastroenteritis; resolved w/ supportive therapy, IV fluids, rec for vestib therapy as outpt; he never f/u w/ anyone after this adm... [place] 2/14 showed sm vessel dis, vasc calcif, global atrophy (no hemorrhage or large area infarct);  MRI Brain confirmed mod advanced atrophy, sm vessel dis, no infarct or hem...     HBP> on Metop25/d, Amlod10, Losar25, Lasix20;  BP= 160/64 but he is in pain & ?took his meds today; he denies CP, palpit, ch in SOB, edema, etc; asked to incr Metop25Bid...    ASHD> on ASA81; followed by [name] & last seen 12/13; [facility]; he gets back pain & claud w/ mod activ- no angina; last NuclearStressTest 9/08 was normal without ischemia and EF=68%; can't find Echo in epic or Centric...    Carotid Art Dis> on ASA81; he denies cerebral ischemic symptoms; CT/ MRI Brain 2/14 as above; last CDoppler 1/13 showed mild heterogeneous plaque bilat w/ 40-59% bilat ICAstenoses & f/u is overdue...     Periph Vasc Dis> on ASA81; he has stable claud w/ ambulation but lim by back pain now; last Art Dopplers 1/13 showed  ABIs .92R & .80L, TBIs were abn but w/o risk of tissue loss; overdue for yearly f/u studies...    CHOL> on Lip20 (off prev Crestor); Labs done at [facility] but he didn't bring reports; he is not fasting today for recheck...    DM> on diet alone (off prev Metform); Labs 2/14 Hosp w/ BS= 150-200+ range; now BS=132, A1c=6.6; he follows low carb diet...    Renal Insuffic> he knows to avoid NSAIDs; Labs 2/14 w/ Creat= 1.4-1.6 range; Labs today showed BUN= 21, Creat= 1.7    DJD, LBP w/ spinal stenosis> on Tramadol50 prn; followed by DrRamos=> DrBrooks at [facility]; he has had PT, he rates back pain 10/10 w/ radiation to right leg; they did MRI w/ report of sp stenosis severe at L4-5; he wants relief & they plan surg if cleared.    Anemia> on FeSO4 325mg /d; Labs 2/14 w/ Hg= 10-11 range; Labs now show Hg= 11.3 We reviewed prob list, meds, xrays and labs> see below for updates >>   CXR 6/14 showed normal heart size, tortuous Ao, mild COPD changes w/ clear lungs/ NAD, DJD in Tspine...   LABS 6/14:  Chems- ok x BS=132 A1c=6.6 Creat=1.7;  CBC- Hg=11.3;  TSH=5.84;  B12=530;  BNP=198...  ~  February 10, 2013:  14mo ROV & Luchiano reports a good interval- s/p lumbar decompression & fusion, no new complaints  or concerns, he is under stress wife is 36 w/ Alz... We reviewed the following medical problems during today's office visit >>     HBP> on Metop25Bid, Amlod10, Losar25-3/d, Lasix20;  BP= 144/60 & he denies CP, palpit, ch in SOB, edema, etc...    ASHD> on ASA81; known CAD & s/p CABG 2000; followed by [name] & last seen 6/14- cleared for back surg after norm Myoview 7/14; he gets back pain & claud w/ mod activ- no angina...    Carotid Art Dis> on ASA81; he denies cerebral ischemic symptoms; CT/ MRI Brain 2/14 w/ atrophy & sm vessel dis; last CDoppler 1/13 showed mild heterogeneous plaque bilat w/ 40-59% bilat ICAstenoses & f/u is overdue...     Periph Vasc Dis> on ASA81; he has stable claud w/ ambulation; last Art  Dopplers 1/13 showed ABIs .92R & .80L, TBIs were abn but w/o risk of tissue loss; overdue for yearly f/u studies...    CHOL> on Lip20 (off prev Crestor); Labs 12/14 showed TChol 127, TG 56, HDL 41, LDL 75...    DM> on diet alone (off prev Metform); Labs 12/14 showed BS= 131, A1c=6.9; he has gained 13# & we reviewed diet, exercise, etc....    Renal Insuffic> he knows to avoid NSAIDs; Labs 12/14 w/ Creat= 1.8, stable...    DJD, LBP w/ spinal stenosis> on Tramadol50 prn; followed by DrRamos=> DrBrooks at [facility]; they did MRI w/ report of sp stenosis severe at L4-5; surg 7/14 w/ lumbar decompression & fusion L4-5...    Anemia> on FeSO4 325mg /d; Labs 2/14 w/ Hg= 10-11 range; Labs 12/14 showed Hg= 10.0 We reviewed prob list, meds, xrays and labs> see below for updates >> he had the 2014 Flu vaccine in Oct...  LABS 12/14:  FLP- at goals on Lip20;  Chems- ok x BS=131, A1c=6.9, Cr=1.8;  CBC= Hg= 10.0;  TSH=8.74 & rec to start Synthroid50...  ~  August 26, 2013:  40mo ROV & Daril's CC is tightness in his legs w/ ambulation; known ASPVD followed by [name] w/ intermittent claudication, but no rest pain & no LE ulcers/ lesions; he notes that the discomfort is similar over the past few yrs- no real change & not progressive; he remains active- shopping, yard, stairs, etc...     BP controlled on Metop25Bid, Amlod10, Losar25-3/d, Lasix20;  BP= 120/74 7 he denies CP, palpit, dyspnea, etc; Labs showed K=4.7, BUN=25, Cr=1.8 stable...    He has ASHD & ASPVD on ASA81; as noted he is active & denies angina etc; he saw [name] 12/14 & stable, continue medical therapy & exercise...    He remains on Lip20 for his Chol w/ all parameters wnl on last FLP...    He has DM controlled on diet alone;weight stable at 171#, BMI=25-6; Labs today showed BS=129, A1c=6.5; continue diet management...    He has a chronic anemia & f/u labs today showed Hg=10.4 (stable) w/ MCV=91, Fe=84 (27%), Ferritin 104; he continues on MVI, Fe, etc...     Finally- during our last visit TSH was 8.74 (up from 5.84) so we decided to start Synthroid50; somehow he says the [facility] stopped this med & he didn't call to check w/ [place]; he feels well, clinically euthyroid, good energy he says; we dicided to recheck his TSH 1st & it returns today at 3.50 so we will leave it off for now 7 monitor carefully going forward...  We reviewed prob list, meds, xrays and labs> see below for updates >>   LABS 7/15:  Chems- ok x BS=129, A1c=6.5, BUN=25, Cr=1.8;  CBC- ok x Hg=10.4, Fe=84 (27%sat), Ferritin=104;  TSH=3.50...  ~  March 01, 2014:  59mo ROV & Isaiahs has had recent f/u w/ [name] with adjustment in meds- [place]; he still has some edema & not taking his Lasix20 regularly;  We reviewed the following medical problems during today's office visit >>     HBP> on Metop25Bid, Amlod5, Losar100, Lasix20 (prn);  BP= 124/68 & he has persistent edema; denies CP, palpit, ch in SOB, etc...    ASHD> on ASA81; known CAD & s/p CABG 2000; followed by [name] & last seen 12/15- norm Myoview 7/14; he denies CP/ angina, palpit, SOB; needs to take Lasix20 daily for the edema.    Carotid Art Dis> on ASA81; he denies cerebral ischemic symptoms; CT/ MRI Brain 2/14 w/ atrophy & sm vessel dis; last CDoppler 1/13 showed mild heterogeneous plaque bilat w/ 40-59% bilat ICAstenoses & f/u is overdue...     Periph Vasc Dis> on ASA81; he has stable claud w/ ambulation; last Art Dopplers 12/15 showed ABIs .79R & .71L, TBIs were abn but w/o risk of tissue loss; overdue for yearly f/u studies...    CHOL> on Lip20 (off prev Crestor); Labs 1/16 showed TChol 164, TG 151, HDL 36, LDL 98... Needs better diet.    DM> on diet alone (off prev Metform); Labs 12/14 showed BS= 131, A1c=6.9; Labs 1/16 showed BS= 127; we reviewed diet, exercise, etc....    Renal Insuffic> he knows to avoid NSAIDs; Labs 12/14 w/ Creat= 1.8 and Labs 1/16 showed Cr= 1.5    DJD, LBP w/ spinal stenosis> on Tramadol50 prn;  followed by DrRamos=> DrBrooks at [facility]; they did MRI w/ report of sp stenosis severe at L4-5; surg 7/14 w/ lumbar decompression & fusion L4-5...    Anemia> on FeSO4 325mg /d; Labs 1/16 w/ Hg= 10.1 stable... We reviewed prob list, meds, xrays and labs> see below for updates >> he will get flu shot at [facility].  CXR 1/16 showed norm heart size, prior CABG, COPD w/ scarring at the bases, NAD...  LABS 1/16:  FLP- ok on Atorva20;  Chems- ok w/ stable Cr=1.5, BS=127;  CBC- mild chr anemia w/ Hg=10.1;  B12=408; TSH=9.10 off meds...          Problem List:  HYPERTENSION (ICD-401.9) >>  ~  controlled on 4 meds:  METOPROLOL 25mg Bid, AMLODIPINE 10mg /d, LOSARTAN 25mg /d, and HCT 25mg - 1/2 qd...  ~  [place] showed mild LVH, AoV sclerosis & mild AI, EF= 60%... ~  labs 11/11 showed BUN=36, Creat=2.1, K=5.1.Marland KitchenMarland Kitchen rec to stop HCTZ... ~  [facility] subseq re-started 12.5mg  HCTZ in the interim due to edema... ~  6/14:  on Metop25/d, Amlod10, Losar25, Lasix20;  BP= 160/64 but he is in pain & ?took his meds today; he denies CP, palpit, ch in SOB, edema, etc; asked to incr Metop25Bid.  ~  CXR 6/14 showed norm heart size, atherosclerotic & tort Ao, emphysematous changes in upper lobes & scarring in lower lobes, DJD in spine, NAD... ~  12/14: on Metop25Bid, Amlod10, Losar25-3/d, Lasix20;  BP= 144/60 & he denies CP, palpit, ch in SOB, edema, etc. ~  7/15: BP controlled on Metop25Bid, Amlod10, Losar25-3/d, Lasix20;  BP= 120/74 7 he denies CP, palpit, dyspnea, etc; Labs showed K=4.7, BUN=25, Cr=1.8 stable. ~  1/16: on Metop25Bid, Amlod5, Losar100, Lasix20 (prn);  BP= 124/68 & he has persistent edema; denies CP, palpit, ch in SOB, etc.  ARTERIOSCLEROTIC HEART  DISEASE (ICD-414.00) - on ASA 81mg /d... long time pt of DrJoeLeBauer & followed by DrBrodie now... s/p CABG in 2000 by DrBartle (for L main dis)... he is active & w/o angina, palpit, change in SOB/ DOE, etc... ~  NuclearStressTest 9/08 was normal without ischemia  and EF=68%... ~  12/13: he saw [name] for f/u CAD, s/p CABG, PAD, Carotid Art dis> no angina, bilat calf pain w/ ambulation but felt to be stable; he adjusted med[facility], but [facility] that... ~  EKG 6/14 showed SBrady, rate56, 1st degree AVB, otherw wnl... ~  7/14:  Myoview showed normal wall motion & EF=61%, no CP or EKG changes... ~  10/10/2022:  He has ASHD & ASPVD on ASA81; as noted he is active & denies angina etc; he saw [name] 12/14 & stable, continue medical therapy & exercise. ~  1/16: on ASA81; known CAD & s/p CABG 2000; followed by [name] & last seen 12/15- norm Myoview 7/14; he denies CP/ angina, palpit, SOB; needs to take Lasix20 daily for the edema.  CAROTID ARTERY DISEASE>  PERIPHERAL VASCULAR DISEASE (ICD-443.9) - on ASA daily and he has bilat femoral bruits... he has seen DrDowney in the past (now [name]) w/ exerc program recommended... he walks regularly w/ stable claud symptoms, no sores or lesions on LE's... ~  ABI's 10/08 were normal bilat = 1.0.Marland KitchenMarland Kitchen waveforms were biphasic however (no change from 2005). ~  CDoppler's 10/08 showed mild bilat carotid dis w/ 40-59% RICA stenosis, and [id number] LICA stenosis. ~  CDoppler's 10/09 showed the same= stable. ~  CDoppler's 11/10 showed stable mild carotid dis bilat> [id number] RICA & [id number] LICA stenoses. ~  ABI's 11/10 showed prob mod severe >50% distal right SFA stenosis, & mod dis in left SFA, ABI's=0.9 ~  Podiarty sent him to [facility] for ArtDopplers 9/11> ABIs 0.82 R & 0.74L w/ bilat SFA dis L>R, toe pressures adeq for healing... ~  6/14: on ASA81; he denies cerebral ischemic symptoms; CT/ MRI Brain 2/14 showed sm vessel dis, vasc calcif, global atrophy (no hemorrhage or large area infarct); last CDoppler 1/13 showed mild heterogeneous plaque bilat w/ 40-59% bilat ICAstenoses... ~  6/14: on ASA81; he has stable claud w/ ambulation but lim by back pain now; last Art Dopplers 1/13 showed ABIs .92R & .80L, TBIs were abn but w/o risk of tissue  loss; overdue for yearly f/u studies of Carotids & ABIs... ~  2022-10-10: He has ASHD & ASPVD on ASA81; as noted he is active & denies angina etc; he saw [name] 12/14 & stable, continue medical therapy & exercise.  HYPERCHOLESTEROLEMIA (ICD-272.0) - prev controlled on Simva40 + diet, the [facility] CRESTOR 10mg /d. ~  [place] 2/09 on Simva40 showed Tchol 169, TG 206, HDL 37, LDL 96 ~  FLP 4/10 on Simva40 showed TChol 146, TG 145, HDL 36, LDL 82 ~  FLP 10/10 on Simva40 showed TChol 166, TG 130, HDL 38, LDL 102 ~  FLP 4/11 on Simva40 showed TChol 138, TG 87, HDL 40, LDL 80... [facility] subseq ch to Cres10 ~  FLP 11/11 on Cres10 showed TChol 147, TG 161, HDL 34, LDL 81 ~  6/12:  He indicates interval labs done by the [facility] but he didn't bring copies for [place] to review... ~  6/14: on Lip20 (off prev Crestor); Labs done at [facility] but he didn't bring reports; he is not fasting today for recheck.  ~  [place] 12/14 on Lip20 showed TChol 127, TG 56, HDL 41, LDL  75  ~  FLP 1/16 on Lip20 showed TChol 164, TG 151, HDL 36, LDL 98  DIABETES MELLITUS (ICD-250.00) - he's struggled w/ diet Rx... wt stable ~175 range... prev on Metformin 500Bid but [facility] changed to GLYBURIDE 5mg /d, then decr to 1/2 tab daily due to side effects & he stopped due to hypogly. ~  labs 10/08 showed FBS= 134, and HgA1c= 5.4... ~  labs 6/09 showed BS= 101, HgA1c= 6.4.Marland KitchenMarland Kitchen monofilament test is normal. ~  Eye eval 1/10 at [facility] showed no DM retinopathy... ~  labs 4/10 on MetformBid showed BS= 106, A1c= 6.8, Urine Microalb= sl incr @ 7.8mg % ~  labs 10/10 showed BS= 119, A1c= 6.5...  NOTE: 2/11 [facility] ch to Glybur5mg - 1/2 daily. ~  labs 4/11 on Glyb2.5 showed BS= 97, A1c= 6.6.Marland KitchenMarland Kitchen pt subseq stopped Glybur due to low sugars. ~  labs 11/11 off meds showed BS= 149, A1c= 6.7.Marland KitchenMarland Kitchen coninue diet Rx. ~  6/12:  He indicates interim labs done by the [facility] but he didn't bring copies for [place] to review... ~  6/14: on diet alone (off prev Metform); Labs 2/14 Hosp w/ BS=  150-200+ range; now BS=132, A1c=6.6; he follows low carb diet ~  Labs 12/14 on diet alone (weight up 13# to 173#) showed BS= 131, A1c= 6.9 ~  7/15: He has DM controlled on diet alone;weight stable at 171#, BMI=25-6; Labs today showed BS=129, A1c=6.5; continue diet management. ~  1/16: on diet alone (off prev Metform); Labs 12/14 showed BS= 131, A1c=6.9; Labs 1/16 showed BS= 127; we reviewed diet, exercise, etc  BORDERLINE TSH >> ~  Serial labs showed rising TSH> 3.57 => 5.84 => 8.74 (12/14) and we decided to start Synthroid23mcg/d;  [facility]how the [facility] told [facility] to stop this med & he did not call to check w/ [place]... ~  7/15  Follow up TSH= 3.50 not on meds, clinically euthyroid, good energy, etc;  we will follow closely...  ~  1/16:  Labs here 1/16 showed TSH= 9.10 & he is requested to restart the Levothy2mcg/d...  RENAL INSUFFICIENCY (ICD-588.9) - Creat 1.8-2.0 in the past... ~  labs 6/09 showed BUN= 31, Creat= 1.6... ~  labs 9/09 showed BUN= 25, Creat= 1.6, K= 4.5 ~  labs 4/10 showed BUN= 26, Creat= 1.6, K= 5.0 ~  labs 10/10 showed BUN= 21, Creat= 1.6 ~  labs 4/11 showed BUN= 30, Creat= 1.9..Marland Kitchen advised to stay well hydrated. ~  labs 11/11 showed BUN= 36, Creat= 2.1.Marland KitchenMarland Kitchen rec to stop the HCTZ ~  6/12:  He indicates that the [facility] restarted HCTZ 12.5mg /d & they are following labs... ~  6/14: he knows to avoid NSAIDs; Labs 2/14 w/ Creat= 1.4-1.6 range; Labs today showed BUN= 21, Creat= 1.7... ~  Labs 12/14 showed BUN= 23, Creat= 1.8 ~  Labs 7/15 showed BUN= 25, Cr= 1.8 ~  Labs 1/16 showed BUN=25, Cr=1.5  DEGENERATIVE JOINT DISEASE (ICD-715.90) LOW BACK PAIN >> he has severe sp stenosis L4-5 per MRI at [facility] 6/14... ~  Notes and Records fro [facility]- DrRamos, DrBrooks> reviewed, they plan surgery if cleared fro CV standpoint...  Hx of ANEMIA OF CHRONIC DISEASE (ICD-285.29) - eval by DrGranfortuna w/ ACD secondary to his renal insuffic and Rx w/ aranesp periodically... nothing else found  on his extensive eval... he was taking Ferrous Gluconate daily (it no longer makes him tired)... ~  labs 2/09 w/ Hg= 11.2.Marland Kitchen. ~  labs 6/09 w/ Hg= 11.0.Marland Kitchen. rec> OTC Fe prep but he never got  it! ~  labs 9/09 showed Hg= 10.5, Fe= 81 ~  labs 4/10 showed Hg= 10.5, MCV= 92, Fe= 77 (22%sat), B12= 590, Folate>20... rec> OTC Fe daily. ~  labs 10/10 showed Hg= 10.3, Fe= 88 ~  labs 4/11 showed Hg= 10.9,  Fe= 58... continue Ferrous Gluc daily w/ VitC ~  labs 11/11 showed Hg= 11.6 ~  6/12:  Again he indicated that the [facility] is following his labs, aske to bring copies for [place] to review. ~  6/14: on FeSO4 325mg /d; Labs 2/14 w/ Hg= 10-11 range; Labs now show Hg= 11.3 ~  Labs 12/14 showed Hg= 10.0 ~  7/15: He has a chronic anemia & f/u labs today showed Hg=10.4 (stable) w/ MCV=91, Fe=84 (27%), Ferritin 104; he continues on MVI, Fe, etc. ~  1/16: on FeSO4 325mg /d; Labs 1/16 w/ Hg= 10.1 stable.   Past Surgical History  Procedure Laterality Date  . Cataract surgery    . Coronary artery bypass graft  2000    by Dr. Cyndia Bent  . Tonsillectomy    . Colonoscopy      Hx: of  . Lumbar laminectomy/decompression microdiscectomy N/A 09/10/2012    Procedure: LUMBAR DECOMPRESSION,  IN SITU FUSION LUMBAR 4-5;  Surgeon: Melina Schools, MD;  Location: [place];  Service: Orthopedics;  Laterality: N/A;    Outpatient Encounter Prescriptions as of 03/01/2014  Medication Sig  . amLODipine (NORVASC) 5 MG tablet Take 1 tablet (5 mg total) by mouth every evening.  Marland Kitchen aspirin EC 81 MG tablet Take 81 mg by mouth every morning.   Marland Kitchen atorvastatin (LIPITOR) 20 MG tablet Take 20 mg by mouth every evening.   . Calcium Carbonate-Vitamin D (CALCIUM 600/VITAMIN D PO) Take 1 tablet by mouth 2 (two) times daily.   . ferrous gluconate (FERGON) 325 MG tablet Take 325 mg by mouth daily with breakfast.   . furosemide (LASIX) 20 MG tablet Take 20 mg by mouth daily as needed for fluid or edema.   Marland Kitchen glucose blood (ACCU-CHEK AVIVA PLUS) test strip Use  as instructed  . Lancets (ACCU-CHEK MULTICLIX) lancets Use as instructed  . losartan (COZAAR) 100 MG tablet Take 100 mg by mouth daily.  . metoprolol tartrate (LOPRESSOR) 25 MG tablet Take 1 tablet (25 mg total) by mouth 2 (two) times daily.  . Multiple Vitamins-Minerals (CENTRUM SILVER PO) Take 1 tablet by mouth every morning.   . Multiple Vitamins-Minerals (PRESERVISION AREDS 2 PO) Take 1 tablet by mouth every evening.    No Known Allergies   Review of Systems        See HPI - all other systems neg except as noted... The patient complains of decreased hearing, dyspnea on exertion, and difficulty walking.  The patient denies anorexia, fever, weight loss, weight gain, vision loss, hoarseness, chest pain, syncope, peripheral edema, prolonged cough, headaches, hemoptysis, abdominal pain, melena, hematochezia, severe indigestion/heartburn, hematuria, incontinence, muscle weakness, suspicious skin lesions, transient blindness, depression, unusual weight change, abnormal bleeding, enlarged lymph nodes, and angioedema.    Objective:   Physical Exam     WD, WN, 81 y/o WM in NAD... GENERAL:  Alert & oriented; pleasant & cooperative... HEENT:  [address]/AT, EOM-wnl, PERRLA, EACs-clear, TMs-wnl, NOSE-clear, THROAT-clear & wnl. NECK:  Supple w/ fairROM; no JVD; normal carotid impulses w/ faint right bruit; no thyromegaly or nodules palpated; no lymphadenopathy. CHEST:  Clear to P & A; without wheezes/ rales/ or rhonchi heard... HEART:  Regular, gr 1/6 SEM without rubs or gallops heard... ABDOMEN:  Soft &  nontender; normal bowel sounds; no organomegaly or masses detected. EXT: without deformities, mod arthritic changes; no varicose veins/ venous insuffic/ or edema. he has bilat fem art bruits & minor pressure area betw right 4th & 5th toes... NEURO:  CN's intact;  no focal neuro deficits... DERM:  Lesion on medial side of right small toe, sl tender...  RADIOLOGY DATA:  Reviewed in the EPIC EMR &  discussed w/ the patient...  LABORATORY DATA:  Reviewed in the EPIC EMR & discussed w/ the patient...   Assessment & Plan:    HBP>  BP is OK on the current regimen; continue same; he is also followed by the [place]...  ASHD/ CAD>  Followed by [name] for [facility] & the [facility]; continue current meds...  ASPVD- Carotid art dis & PAD>  Decr ABIs & Carotid dis> on ASA 81mg /d; followed by [name] et al...  CHOL>  On Lipitor20 and FLP is at goals...  DM>  On diet alone & labs also followed by there [facility]; labs today showed BS=127...  Renal Insuffic>  Creat stable in the 1.85-1.8 range on current meds (better off NSAIDs & HCTZ)...  Anemia>  Hg seems stable in the 10-11 range w/ ACD...   Patient's Medications  New Prescriptions   LEVOTHYROXINE (SYNTHROID) 50 MCG TABLET    Take 1 tablet (50 mcg total) by mouth daily before breakfast.  Previous Medications   AMLODIPINE (NORVASC) 5 MG TABLET    Take 1 tablet (5 mg total) by mouth every evening.   ASPIRIN EC 81 MG TABLET    Take 81 mg by mouth every morning.    ATORVASTATIN (LIPITOR) 20 MG TABLET    Take 20 mg by mouth every evening.    CALCIUM CARBONATE-VITAMIN D (CALCIUM 600/VITAMIN D PO)    Take 1 tablet by mouth 2 (two) times daily.    FERROUS GLUCONATE (FERGON) 325 MG TABLET    Take 325 mg by mouth daily with breakfast.    FUROSEMIDE (LASIX) 20 MG TABLET    Take 20 mg by mouth daily as needed for fluid or edema.    GLUCOSE BLOOD (ACCU-CHEK AVIVA PLUS) TEST STRIP    Use as instructed   LANCETS (ACCU-CHEK MULTICLIX) LANCETS    Use as instructed   LOSARTAN (COZAAR) 100 MG TABLET    Take 100 mg by mouth daily.   METOPROLOL TARTRATE (LOPRESSOR) 25 MG TABLET    Take 1 tablet (25 mg total) by mouth 2 (two) times daily.   MULTIPLE VITAMINS-MINERALS (CENTRUM SILVER PO)    Take 1 tablet by mouth every morning.    MULTIPLE VITAMINS-MINERALS (PRESERVISION AREDS 2 PO)    Take 1 tablet by mouth every evening.  Modified Medications   No medications  on file  Discontinued Medications   No medications on file

## 2014-03-03 ENCOUNTER — Other Ambulatory Visit (INDEPENDENT_AMBULATORY_CARE_PROVIDER_SITE_OTHER): Payer: Medicare Other

## 2014-03-03 DIAGNOSIS — E538 Deficiency of other specified B group vitamins: Secondary | ICD-10-CM | POA: Diagnosis not present

## 2014-03-03 DIAGNOSIS — D638 Anemia in other chronic diseases classified elsewhere: Secondary | ICD-10-CM | POA: Diagnosis not present

## 2014-03-03 DIAGNOSIS — E7800 Pure hypercholesterolemia, unspecified: Secondary | ICD-10-CM

## 2014-03-03 DIAGNOSIS — F419 Anxiety disorder, unspecified: Secondary | ICD-10-CM

## 2014-03-03 DIAGNOSIS — I10 Essential (primary) hypertension: Secondary | ICD-10-CM | POA: Diagnosis not present

## 2014-03-03 LAB — TSH: TSH: 9.1 u[IU]/mL — AB (ref 0.35–4.50)

## 2014-03-03 LAB — CBC WITH DIFFERENTIAL/PLATELET
BASOS PCT: 1 % (ref 0.0–3.0)
Basophils Absolute: 0.1 10*3/uL (ref 0.0–0.1)
EOS ABS: 0.4 10*3/uL (ref 0.0–0.7)
Eosinophils Relative: 6.3 % — ABNORMAL HIGH (ref 0.0–5.0)
HEMATOCRIT: 30.2 % — AB (ref 39.0–52.0)
HEMOGLOBIN: 10.1 g/dL — AB (ref 13.0–17.0)
Lymphocytes Relative: 22.7 % (ref 12.0–46.0)
Lymphs Abs: 1.4 10*3/uL (ref 0.7–4.0)
MCHC: 33.4 g/dL (ref 30.0–36.0)
MCV: 90.9 fl (ref 78.0–100.0)
MONOS PCT: 14.3 % — AB (ref 3.0–12.0)
Monocytes Absolute: 0.9 10*3/uL (ref 0.1–1.0)
NEUTROS ABS: 3.4 10*3/uL (ref 1.4–7.7)
Neutrophils Relative %: 55.7 % (ref 43.0–77.0)
Platelets: 191 10*3/uL (ref 150.0–400.0)
RBC: 3.32 Mil/uL — ABNORMAL LOW (ref 4.22–5.81)
RDW: 13.1 % (ref 11.5–15.5)
WBC: 6.1 10*3/uL (ref 4.0–10.5)

## 2014-03-03 LAB — VITAMIN B12: Vitamin B-12: 408 pg/mL (ref 211–911)

## 2014-03-03 LAB — LIPID PANEL
Cholesterol: 164 mg/dL (ref 0–200)
HDL: 35.6 mg/dL — ABNORMAL LOW (ref >39.00)
LDL Cholesterol: 98 mg/dL (ref 0–99)
NonHDL: 128.4
TRIGLYCERIDES: 151 mg/dL — AB (ref 0.0–149.0)
Total CHOL/HDL Ratio: 5
VLDL: 30.2 mg/dL (ref 0.0–40.0)

## 2014-03-03 LAB — BASIC METABOLIC PANEL
BUN: 25 mg/dL — ABNORMAL HIGH (ref 6–23)
CO2: 27 meq/L (ref 19–32)
CREATININE: 1.8 mg/dL — AB (ref 0.4–1.5)
Calcium: 9.3 mg/dL (ref 8.4–10.5)
Chloride: 105 mEq/L (ref 96–112)
GFR: 37.81 mL/min — ABNORMAL LOW (ref >60.00)
Glucose, Bld: 127 mg/dL — ABNORMAL HIGH (ref 70–99)
POTASSIUM: 4.2 meq/L (ref 3.5–5.1)
Sodium: 137 mEq/L (ref 135–145)

## 2014-03-03 LAB — HEPATIC FUNCTION PANEL
ALBUMIN: 4 g/dL (ref 3.5–5.2)
ALT: 16 U/L (ref 0–53)
AST: 19 U/L (ref 0–37)
Alkaline Phosphatase: 85 U/L (ref 39–117)
BILIRUBIN TOTAL: 0.7 mg/dL (ref 0.2–1.2)
Bilirubin, Direct: 0.1 mg/dL (ref 0.0–0.3)
Total Protein: 7.2 g/dL (ref 6.0–8.3)

## 2014-03-04 ENCOUNTER — Other Ambulatory Visit: Payer: Self-pay

## 2014-03-04 MED ORDER — LEVOTHYROXINE SODIUM 50 MCG PO TABS: 50.0000 ug | ORAL_TABLET | Freq: Every day | ORAL | Status: DC

## 2014-03-04 NOTE — Progress Notes (Signed)
Quick Note:  Spoke with pt, he is aware. Nothing further needed. ______

## 2014-03-04 NOTE — Progress Notes (Signed)
Quick Note:  Pt aware of results and recs. levothyroid sent to [facility]. Nothing further needed. ______

## 2014-03-15 DIAGNOSIS — Z23 Encounter for immunization: Secondary | ICD-10-CM

## 2014-03-18 ENCOUNTER — Ambulatory Visit (HOSPITAL_COMMUNITY): Payer: Medicare Other | Attending: Cardiology | Admitting: Cardiology

## 2014-03-18 DIAGNOSIS — E119 Type 2 diabetes mellitus without complications: Secondary | ICD-10-CM | POA: Insufficient documentation

## 2014-03-18 DIAGNOSIS — Z87891 Personal history of nicotine dependence: Secondary | ICD-10-CM | POA: Diagnosis not present

## 2014-03-18 DIAGNOSIS — Z951 Presence of aortocoronary bypass graft: Secondary | ICD-10-CM | POA: Diagnosis not present

## 2014-03-18 DIAGNOSIS — R0989 Other specified symptoms and signs involving the circulatory and respiratory systems: Secondary | ICD-10-CM

## 2014-03-18 DIAGNOSIS — I6523 Occlusion and stenosis of bilateral carotid arteries: Secondary | ICD-10-CM

## 2014-03-18 DIAGNOSIS — I10 Essential (primary) hypertension: Secondary | ICD-10-CM | POA: Insufficient documentation

## 2014-03-18 DIAGNOSIS — E785 Hyperlipidemia, unspecified: Secondary | ICD-10-CM | POA: Insufficient documentation

## 2014-03-18 DIAGNOSIS — I2510 Atherosclerotic heart disease of native coronary artery without angina pectoris: Secondary | ICD-10-CM | POA: Diagnosis not present

## 2014-03-18 DIAGNOSIS — I739 Peripheral vascular disease, unspecified: Secondary | ICD-10-CM | POA: Diagnosis not present

## 2014-03-18 NOTE — Progress Notes (Signed)
Carotid duplex performed 

## 2014-04-21 DIAGNOSIS — L821 Other seborrheic keratosis: Secondary | ICD-10-CM | POA: Diagnosis not present

## 2014-04-21 DIAGNOSIS — L219 Seborrheic dermatitis, unspecified: Secondary | ICD-10-CM | POA: Diagnosis not present

## 2014-06-09 DIAGNOSIS — E119 Type 2 diabetes mellitus without complications: Secondary | ICD-10-CM | POA: Diagnosis not present

## 2014-08-02 DIAGNOSIS — H35363 Drusen (degenerative) of macula, bilateral: Secondary | ICD-10-CM | POA: Diagnosis not present

## 2014-08-02 DIAGNOSIS — E119 Type 2 diabetes mellitus without complications: Secondary | ICD-10-CM | POA: Diagnosis not present

## 2014-08-02 DIAGNOSIS — H3531 Nonexudative age-related macular degeneration: Secondary | ICD-10-CM | POA: Diagnosis not present

## 2014-08-02 DIAGNOSIS — H35373 Puckering of macula, bilateral: Secondary | ICD-10-CM | POA: Diagnosis not present

## 2014-08-23 ENCOUNTER — Other Ambulatory Visit: Payer: Self-pay

## 2014-08-31 ENCOUNTER — Other Ambulatory Visit (INDEPENDENT_AMBULATORY_CARE_PROVIDER_SITE_OTHER): Payer: Medicare Other

## 2014-08-31 ENCOUNTER — Encounter: Payer: Self-pay | Admitting: Pulmonary Disease

## 2014-08-31 ENCOUNTER — Ambulatory Visit (INDEPENDENT_AMBULATORY_CARE_PROVIDER_SITE_OTHER): Payer: Medicare Other | Admitting: Pulmonary Disease

## 2014-08-31 VITALS — BP 114/56 | HR 64 | Temp 97.3°F | Wt 167.2 lb

## 2014-08-31 DIAGNOSIS — I6523 Occlusion and stenosis of bilateral carotid arteries: Secondary | ICD-10-CM

## 2014-08-31 DIAGNOSIS — I10 Essential (primary) hypertension: Secondary | ICD-10-CM

## 2014-08-31 DIAGNOSIS — N183 Chronic kidney disease, stage 3 unspecified: Secondary | ICD-10-CM

## 2014-08-31 DIAGNOSIS — M545 Low back pain, unspecified: Secondary | ICD-10-CM

## 2014-08-31 DIAGNOSIS — D631 Anemia in chronic kidney disease: Secondary | ICD-10-CM | POA: Diagnosis not present

## 2014-08-31 DIAGNOSIS — E119 Type 2 diabetes mellitus without complications: Secondary | ICD-10-CM

## 2014-08-31 DIAGNOSIS — I739 Peripheral vascular disease, unspecified: Secondary | ICD-10-CM

## 2014-08-31 DIAGNOSIS — E7800 Pure hypercholesterolemia, unspecified: Secondary | ICD-10-CM

## 2014-08-31 DIAGNOSIS — M150 Primary generalized (osteo)arthritis: Secondary | ICD-10-CM

## 2014-08-31 DIAGNOSIS — I70219 Atherosclerosis of native arteries of extremities with intermittent claudication, unspecified extremity: Secondary | ICD-10-CM

## 2014-08-31 DIAGNOSIS — D638 Anemia in other chronic diseases classified elsewhere: Secondary | ICD-10-CM

## 2014-08-31 DIAGNOSIS — M159 Polyosteoarthritis, unspecified: Secondary | ICD-10-CM

## 2014-08-31 DIAGNOSIS — I25719 Atherosclerosis of autologous vein coronary artery bypass graft(s) with unspecified angina pectoris: Secondary | ICD-10-CM

## 2014-08-31 DIAGNOSIS — E039 Hypothyroidism, unspecified: Secondary | ICD-10-CM

## 2014-08-31 LAB — FERRITIN: Ferritin: 108.8 ng/mL (ref 22.0–322.0)

## 2014-08-31 LAB — CBC WITH DIFFERENTIAL/PLATELET
BASOS ABS: 0 10*3/uL (ref 0.0–0.1)
BASOS PCT: 0.5 % (ref 0.0–3.0)
EOS ABS: 0.3 10*3/uL (ref 0.0–0.7)
Eosinophils Relative: 3.8 % (ref 0.0–5.0)
HCT: 29.7 % — ABNORMAL LOW (ref 39.0–52.0)
Hemoglobin: 10 g/dL — ABNORMAL LOW (ref 13.0–17.0)
Lymphocytes Relative: 20 % (ref 12.0–46.0)
Lymphs Abs: 1.6 10*3/uL (ref 0.7–4.0)
MCHC: 33.5 g/dL (ref 30.0–36.0)
MCV: 89.8 fl (ref 78.0–100.0)
MONO ABS: 1.4 10*3/uL — AB (ref 0.1–1.0)
Monocytes Relative: 16.9 % — ABNORMAL HIGH (ref 3.0–12.0)
NEUTROS PCT: 58.8 % (ref 43.0–77.0)
Neutro Abs: 4.7 10*3/uL (ref 1.4–7.7)
Platelets: 212 10*3/uL (ref 150.0–400.0)
RBC: 3.31 Mil/uL — AB (ref 4.22–5.81)
RDW: 13.4 % (ref 11.5–15.5)
WBC: 8.1 10*3/uL (ref 4.0–10.5)

## 2014-08-31 LAB — BASIC METABOLIC PANEL
BUN: 28 mg/dL — ABNORMAL HIGH (ref 6–23)
CALCIUM: 9.4 mg/dL (ref 8.4–10.5)
CO2: 26 mEq/L (ref 19–32)
CREATININE: 2.01 mg/dL — AB (ref 0.40–1.50)
Chloride: 104 mEq/L (ref 96–112)
GFR: 33.46 mL/min — AB (ref >60.00)
Glucose, Bld: 116 mg/dL — ABNORMAL HIGH (ref 70–99)
Potassium: 5.3 mEq/L — ABNORMAL HIGH (ref 3.5–5.1)
Sodium: 136 mEq/L (ref 135–145)

## 2014-08-31 LAB — HEMOGLOBIN A1C: HEMOGLOBIN A1C: 6.3 % (ref 4.6–6.5)

## 2014-08-31 LAB — IBC PANEL
IRON: 57 ug/dL (ref 42–165)
Saturation Ratios: 20.4 % (ref 20.0–50.0)
Transferrin: 200 mg/dL — ABNORMAL LOW (ref 212.0–360.0)

## 2014-08-31 LAB — TSH: TSH: 3.62 u[IU]/mL (ref 0.35–4.50)

## 2014-08-31 NOTE — Progress Notes (Signed)
Subjective:    Patient ID: Henry [name], male    DOB: 1924/04/02, 81 y.o.   MRN: [id number]  HPI 81 y/o WM here for a follow up visit... he has mult med problems as listed and is followed by DrBBrodie and DrPerry as well... he gets his meds filled at the [facility] which makes for a complicated array of perscribed meds and substituted alternatives... ~  SEE PREV EPIC NOTES FOR OLDER DATA >>    CXR 6/14 showed normal heart size, tortuous Ao, mild COPD changes w/ clear lungs/ NAD, DJD in Tspine...   LABS 6/14:  Chems- ok x BS=132 A1c=6.6 Creat=1.7;  CBC- Hg=11.3;  TSH=5.84;  B12=530;  BNP=198...  LABS 12/14:  FLP- at goals on Lip20;  Chems- ok x BS=131, A1c=6.9, Cr=1.8;  CBC= Hg= 10.0;  TSH=8.74 & rec to start Synthroid50...  ~  August 26, 2013:  49mo ROV & Henry [name]'s CC is tightness in his legs w/ ambulation; known ASPVD followed by [name] w/ intermittent claudication, but no rest pain & no LE ulcers/ lesions; he notes that the discomfort is similar over the past few yrs- no real change & not progressive; he remains active- shopping, yard, stairs, etc...     BP controlled on Metop25Bid, Amlod10, Losar25-3/d, Lasix20;  BP= 120/74 7 he denies CP, palpit, dyspnea, etc; Labs showed K=4.7, BUN=25, Cr=1.8 stable...    He has ASHD & ASPVD on ASA81; as noted he is active & denies angina etc; he saw [name] 12/14 & stable, continue medical therapy & exercise...    He remains on Lip20 for his Chol w/ all parameters wnl on last FLP...    He has DM controlled on diet alone;weight stable at 171#, BMI=25-6; Labs today showed BS=129, A1c=6.5; continue diet management...    He has a chronic anemia & f/u labs today showed Hg=10.4 (stable) w/ MCV=91, Fe=84 (27%), Ferritin 104; he continues on MVI, Fe, etc...    Finally- during our last visit TSH was 8.74 (up from 5.84) so we decided to start Synthroid50; somehow he says the [facility] stopped this med & he didn't call to check w/ [place]; he feels well, clinically euthyroid, good  energy he says; we dicided to recheck his TSH 1st & it returns today at 3.50 so we will leave it off for now 7 monitor carefully going forward...  We reviewed prob list, meds, xrays and labs> see below for updates >>   LABS 7/15:  Chems- ok x BS=129, A1c=6.5, BUN=25, Cr=1.8;  CBC- ok x Hg=10.4, Fe=84 (27%sat), Ferritin=104;  TSH=3.50...  ~  March 01, 2014:  87mo ROV & Henry [name] has had recent f/u w/ [name] with adjustment in meds- [place]; he still has some edema & not taking his Lasix20 regularly;  We reviewed the following medical problems during today's office visit >>     HBP> on Metop25Bid, Amlod5, Losar100, Lasix20 (prn);  BP= 124/68 & he has persistent edema; denies CP, palpit, ch in SOB, etc...    ASHD> on ASA81; known CAD & s/p CABG 2000; followed by [name] & last seen 12/15- norm Myoview 7/14; he denies CP/ angina, palpit, SOB; needs to take Lasix20 daily for the edema.    Carotid Art Dis> on ASA81; he denies cerebral ischemic symptoms; CT/ MRI Brain 2/14 w/ atrophy & sm vessel dis; last CDoppler 1/13 showed mild heterogeneous plaque bilat w/ 40-59% bilat ICAstenoses & f/u is overdue...     Periph Vasc Dis> on ASA81; he has stable  claud w/ ambulation; last Art Dopplers 12/15 showed ABIs .79R & .71L, TBIs were abn but w/o risk of tissue loss; overdue for yearly f/u studies...    CHOL> on Lip20 (off prev Crestor); Labs 1/16 showed TChol 164, TG 151, HDL 36, LDL 98... Needs better diet.    DM> on diet alone (off prev Metform); Labs 12/14 showed BS= 131, A1c=6.9; Labs 1/16 showed BS= 127; we reviewed diet, exercise, etc....    Renal Insuffic> he knows to avoid NSAIDs; Labs 12/14 w/ Creat= 1.8 and Labs 1/16 showed Cr= 1.5    DJD, LBP w/ spinal stenosis> on Tramadol50 prn; followed by DrRamos=> DrBrooks at [facility]; they did MRI w/ report of sp stenosis severe at L4-5; surg 7/14 w/ lumbar decompression & fusion L4-5...    Anemia> on FeSO4 325mg /d; Labs 1/16 w/ Hg= 10.1  stable... We reviewed prob list, meds, xrays and labs> see below for updates >> he will get flu shot at [facility].  CXR 1/16 showed norm heart size, prior CABG, COPD w/ scarring at the bases, NAD...  LABS 1/16:  FLP- ok on Atorva20;  Chems- ok w/ stable Cr=1.5, BS=127;  CBC- mild chr anemia w/ Hg=10.1;  B12=408; TSH=9.10 off meds...  ~  August 31, 2014:  64mo ROV & Henry [name] has had a good interval- his CC's are "tightness in my legs" esp w/ walking & he has known ASPVD w/ f/u [name] soon, and feeling sl better on the Synthroid50 regularly... We reviewed the following medical problems during today's office visit >>     HBP> on Metop25Bid, Amlod5, Losar100, Lasix20 (prn);  BP= 114/56 & he has persistent mild edema; denies CP, palpit, ch in SOB, etc...    ASHD> on ASA81; known CAD & s/p CABG 2000; followed by [name] & last seen 12/15- norm Myoview 7/14; he denies CP/ angina, palpit, SOB; needs to take Lasix20 daily for the edema.    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    Periph Vasc Dis> on ASA81; he has stable claud w/ ambulation; last Art Dopplers 12/15 showed ABIs .79R & .71L, TBIs were abn but w/o risk of tissue loss; overdue for yearly f/u studies...    CHOL> on Lip20 (off prev Crestor); Labs 1/16 showed TChol 164, TG 151, HDL 36, LDL 98... Needs better diet.    DM> on diet alone (off prev Metform); Labs 1/16 showed BS= 127; Labs 7/16 showed BS=116, A1c=6.3; we reviewed diet, exercise, etc....    Renal Insuffic> he knows to avoid NSAIDs; Labs 12/14 w/ Creat= 1.8 and Labs 1/16 showed Cr= 1.5; Labs 7/16 w/ Cr=2.0 & rec to incr water intake...    DJD, LBP w/ spinal stenosis> on Tramadol50 prn; followed by DrRamos=> DrBrooks at [facility]; they did MRI w/ report of sp stenosis severe at L4-5; surg 7/14 w/ lumbar  decompression & fusion L4-5...    Anemia> on FeSO4 325mg /d; Labs 1/16 w/ Hg= 10.1 stable; Labs 7/16 showed Hg=10, Fe=57 (20%sat), Ferritin=109; continue same... We reviewed prob list, meds, xrays and labs> see below for updates >>   LABS 7/16:  Chems- ok w/ BS=116, A1c=6.3, BUN=28, Cr=2.0;  CBC- anemic w/ Hg=10.0, Fe=57 (20%), Ferritin=109;  TSH=3.62... PLAN>>  Continue same meds, continue f/u [name], ROV w/ me in 58mo...         Problem  List:  HYPERTENSION (ICD-401.9) >>  ~  controlled on 4 meds:  METOPROLOL 25mg Bid, AMLODIPINE 10mg /d, LOSARTAN 25mg /d, and HCT 25mg - 1/2 qd...  ~  [employer] showed mild LVH, AoV sclerosis & mild AI, EF= 60%... ~  labs 11/11 showed BUN=36, Creat=2.1, K=5.1.Marland KitchenMarland Kitchen rec to stop HCTZ... ~  [facility] subseq re-started 12.5mg  HCTZ in the interim due to edema... ~  6/14:  on Metop25/d, Amlod10, Losar25, Lasix20;  BP= 160/64 but he is in pain & ?took his meds today; he denies CP, palpit, ch in SOB, edema, etc; asked to incr Metop25Bid.  ~  CXR 6/14 showed norm heart size, atherosclerotic & tort Ao, emphysematous changes in upper lobes & scarring in lower lobes, DJD in spine, NAD... ~  12/14: on Metop25Bid, Amlod10, Losar25-3/d, Lasix20;  BP= 144/60 & he denies CP, palpit, ch in SOB, edema, etc. ~  7/15: BP controlled on Metop25Bid, Amlod10, Losar25-3/d, Lasix20;  BP= 120/74 7 he denies CP, palpit, dyspnea, etc; Labs showed K=4.7, BUN=25, Cr=1.8 stable. ~  1/16: on Metop25Bid, Amlod5, Losar100, Lasix20 (prn);  BP= 124/68 & he has persistent edema; denies CP, palpit, ch in SOB, etc. ~  7/16: on Metop25Bid, Amlod5, Losar100, Lasix20 (prn);  BP= 114/56 & he remains stable...  ARTERIOSCLEROTIC HEART DISEASE (ICD-414.00) - on ASA 81mg /d... long time pt of DrJoeLeBauer & followed by DrBrodie now... s/p CABG in 2000 by DrBartle (for L main dis)... he is active & w/o angina, palpit, change in SOB/ DOE, etc... ~  NuclearStressTest 9/08 was normal without ischemia and EF=68%... ~   12/13: he saw [name] for f/u CAD, s/p CABG, PAD, Carotid Art dis> no angina, bilat calf pain w/ ambulation but felt to be stable; he [facility], but [facility]... ~  EKG 6/14 showed SBrady, rate56, 1st degree AVB, otherw wnl... ~  7/14:  Myoview showed normal wall motion & EF=61%, no CP or EKG changes... 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 ~  1/16: on ASA81; known CAD & s/p CABG 2000; followed by [name] & last seen 12/15- norm Myoview 7/14; he denies CP/ angina, palpit, SOB; needs to take Lasix20 daily for the edema.  CAROTID ARTERY DISEASE>  PERIPHERAL VASCULAR DISEASE (ICD-443.9) - on ASA daily and he has bilat femoral bruits... he has seen DrDowney in the past (now [name]) w/ exerc program recommended... he walks regularly w/ stable claud symptoms, no sores or lesions on LE's... ~  ABI's 10/08 were normal bilat = 1.0.Marland KitchenMarland Kitchen waveforms were biphasic however (no change from 2005). ~  CDoppler's 10/08 showed mild bilat carotid dis w/ 40-59% RICA stenosis, and [id number] LICA stenosis. ~  CDoppler's 10/09 showed the same= stable. ~  CDoppler's 11/10 showed stable mild carotid dis bilat> [id number] RICA & [id number] LICA stenoses. ~  ABI's 11/10 showed prob mod severe >50% distal right SFA stenosis, & mod dis in left SFA, ABI's=0.9 ~  Podiarty sent him to [facility] for ArtDopplers 9/11> ABIs 0.82 R & 0.74L w/ bilat SFA dis L>R, toe pressures adeq for healing... ~  6/14: on ASA81; he denies cerebral ischemic symptoms; CT/ MRI Brain 2/14 showed sm vessel dis, vasc calcif, global atrophy (no hemorrhage or large area infarct); last CDoppler 1/13 showed mild heterogeneous plaque bilat w/ 40-59% bilat ICAstenoses... ~  6/14: on ASA81; he has stable claud w/ ambulation but lim by back pain  now; last Art Dopplers 1/13 showed ABIs .92R & .80L, TBIs were abn but w/o risk of tissue loss; overdue for  yearly f/u studies of Carotids & ABIs... ~  2022/09/20: He has ASHD & ASPVD on ASA81; as noted he is active & denies angina etc; he saw [name] 12/14 & stable, continue medical therapy & exercise. ~  CDoppler 1/16 showed mild heterogeneous plaque bilat w/ 40-59% bilat ICAstenoses & norm subclav/ antegrade verts.  HYPERCHOLESTEROLEMIA (ICD-272.0) - prev controlled on Simva40 + diet, [facility] CRESTOR 10mg /d. ~  [place] 2/09 on Simva40 showed Tchol 169, TG 206, HDL 37, LDL 96 ~  FLP 4/10 on Simva40 showed TChol 146, TG 145, HDL 36, LDL 82 ~  FLP 10/10 on Simva40 showed TChol 166, TG 130, HDL 38, LDL 102 ~  FLP 4/11 on Simva40 showed TChol 138, TG 87, HDL 40, LDL 80... [facility] subseq ch to Cres10 ~  FLP 11/11 on Cres10 showed TChol 147, TG 161, HDL 34, LDL 81 ~  6/12:  He indicates interval labs done by the [facility] but he didn't bring copies for [place] to review... ~  6/14: on Lip20 (off prev Crestor); Labs done at [facility] but he didn't bring reports; he is not fasting today for recheck.  ~  [place] 12/14 on Lip20 showed TChol 127, TG 56, HDL 41, LDL 75  ~  FLP 1/16 on Lip20 showed TChol 164, TG 151, HDL 36, LDL 98  DIABETES MELLITUS (ICD-250.00) - he's struggled w/ diet Rx... wt stable ~175 range... prev on Metformin 500Bid but [facility] changed to GLYBURIDE 5mg /d, then decr to 1/2 tab daily due to side effects & he stopped due to hypogly. ~  labs 10/08 showed FBS= 134, and HgA1c= 5.4... ~  labs 6/09 showed BS= 101, HgA1c= 6.4.Marland KitchenMarland Kitchen monofilament test is normal. ~  [facility]/10 [facility] showed no DM retinopathy... ~  labs 4/10 on MetformBid showed BS= 106, A1c= 6.8, Urine Microalb= sl incr @ 7.8mg % ~  labs 10/10 showed BS= 119, A1c= 6.5...  NOTE: 2/11 [facility] ch to Glybur5mg - 1/2 daily. ~  labs 4/11 on Glyb2.5 showed BS= 97, A1c= 6.6.Marland KitchenMarland Kitchen pt subseq stopped Glybur due to low sugars. ~  labs 11/11 off meds showed BS= 149, A1c= 6.7.Marland KitchenMarland Kitchen coninue diet Rx. ~  6/12:  He indicates interim labs done by the [facility] but he didn't  bring copies for [place] to review... ~  6/14: on diet alone (off prev Metform); Labs 2/14 Hosp w/ BS= 150-200+ range; now BS=132, A1c=6.6; he follows low carb diet ~  Labs 12/14 on diet alone (weight up 13# to 173#) showed BS= 131, A1c= 6.9 ~  20-Sep-2022: He has DM controlled on diet alone;weight stable at 171#, BMI=25-6; Labs today showed BS=129, A1c=6.5; continue diet management. ~  1/16: on diet alone (off prev Metform); Labs 12/14 showed BS= 131, A1c=6.9; Labs 1/16 showed BS= 127; we reviewed diet, exercise, etc ~  7/16:  On diet alone> Labs 7/16 showed BS=116, A1c=6.3; we reviewed diet, exercise, etc...  BORDERLINE TSH >> ~  Serial labs showed rising TSH> 3.57 => 5.84 => 8.74 (12/14) and we decided to start Synthroid69mcg/d;  [facility]how [facility] told [facility] to [facility] this med & he did not call to check w/ [place]... ~  Sep 20, 2022  Follow up TSH= 3.50 not on meds, clinically euthyroid, good energy, etc;  we will follow closely...  ~  1/16:  Labs here 1/16 showed TSH= 9.10 & he is requested  to restart the Levothy76mcg/d... ~  7/16: now on Levothy50 every day> Labs 7/16 showed TSH= 3.62, continue same.  RENAL INSUFFICIENCY (ICD-588.9) - Creat 1.8-2.0 in the past... ~  labs 6/09 showed BUN= 31, Creat= 1.6... ~  labs 9/09 showed BUN= 25, Creat= 1.6, K= 4.5 ~  labs 4/10 showed BUN= 26, Creat= 1.6, K= 5.0 ~  labs 10/10 showed BUN= 21, Creat= 1.6 ~  labs 4/11 showed BUN= 30, Creat= 1.9..Marland Kitchen advised to stay well hydrated. ~  labs 11/11 showed BUN= 36, Creat= 2.1.Marland KitchenMarland Kitchen rec to stop the HCTZ ~  6/12:  He indicates that the [facility] restarted HCTZ 12.5mg /d & they are following labs... ~  6/14: he knows to avoid NSAIDs; Labs 2/14 w/ Creat= 1.4-1.6 range; Labs today showed BUN= 21, Creat= 1.7... ~  Labs 12/14 showed BUN= 23, Creat= 1.8 ~  Labs 7/15 showed BUN= 25, Cr= 1.8 ~  Labs 1/16 showed BUN= 25, Cr= 1.5 ~  Labs 7/16 showed BUN= 28, Cr= 2.0  DEGENERATIVE JOINT DISEASE (ICD-715.90) LOW BACK PAIN >> he has severe sp stenosis L4-5  per MRI at [facility] 6/14... ~  Notes and Records fro [facility]- DrRamos, DrBrooks> reviewed, they plan surgery if cleared fro CV standpoint...  Hx of ANEMIA OF CHRONIC DISEASE (ICD-285.29) - eval by DrGranfortuna w/ ACD secondary to his renal insuffic and Rx w/ aranesp periodically... nothing else found on his extensive eval... he was taking Ferrous Gluconate daily (it no longer makes him tired)... ~  labs 2/09 w/ Hg= 11.2.Marland Kitchen. ~  labs 6/09 w/ Hg= 11.0.Marland Kitchen. rec> OTC Fe prep but he never got it! ~  labs 9/09 showed Hg= 10.5, Fe= 81 ~  labs 4/10 showed Hg= 10.5, MCV= 92, Fe= 77 (22%sat), B12= 590, Folate>20... rec> OTC Fe daily. ~  labs 10/10 showed Hg= 10.3, Fe= 88 ~  labs 4/11 showed Hg= 10.9,  Fe= 58... continue Ferrous Gluc daily w/ VitC ~  labs 11/11 showed Hg= 11.6 ~  6/12:  Again he indicated that the [facility] is following his labs, aske to bring copies for [place] to review. ~  6/14: on FeSO4 325mg /d; Labs 2/14 w/ Hg= 10-11 range; Labs now show Hg= 11.3 ~  Labs 12/14 showed Hg= 10.0 ~  7/15: He has a chronic anemia & f/u labs today showed Hg=10.4 (stable) w/ MCV=91, Fe=84 (27%), Ferritin 104; he continues on MVI, Fe, etc. ~  1/16: on FeSO4 325mg /d; Labs 1/16 w/ Hg= 10.1 stable. ~  7/16: on Fe daily> Labs showed Hg=10, Fe=57 (20%sat), Ferritin=109, continue same...   Past Surgical History  Procedure Laterality Date  . Cataract surgery    . Coronary artery bypass graft  2000    by Dr. Cyndia Bent  . Tonsillectomy    . Colonoscopy      Hx: of  . Lumbar laminectomy/decompression microdiscectomy N/A 09/10/2012    Procedure: LUMBAR DECOMPRESSION,  IN SITU FUSION LUMBAR 4-5;  Surgeon: Melina Schools, MD;  Location: [place];  Service: Orthopedics;  Laterality: N/A;    Outpatient Encounter Prescriptions as of 08/31/2014  Medication Sig  . amLODipine (NORVASC) 5 MG tablet Take 1 tablet (5 mg total) by mouth every evening.  Marland Kitchen aspirin EC 81 MG tablet Take 81 mg by mouth every morning.   Marland Kitchen  atorvastatin (LIPITOR) 20 MG tablet Take 20 mg by mouth every evening.   . Calcium Carbonate-Vitamin D (CALCIUM 600/VITAMIN D PO) Take 1 tablet by mouth 2 (two) times daily.   . ferrous gluconate (  FERGON) 325 MG tablet Take 325 mg by mouth daily with breakfast.   . furosemide (LASIX) 20 MG tablet Take 20 mg by mouth daily as needed for fluid or edema.   Marland Kitchen glucose blood (ACCU-CHEK AVIVA PLUS) test strip Use as instructed  . Lancets (ACCU-CHEK MULTICLIX) lancets Use as instructed  . levothyroxine (SYNTHROID) 50 MCG tablet Take 1 tablet (50 mcg total) by mouth daily before breakfast.  . losartan (COZAAR) 100 MG tablet Take 100 mg by mouth daily.  . metoprolol tartrate (LOPRESSOR) 25 MG tablet Take 1 tablet (25 mg total) by mouth 2 (two) times daily.  . Multiple Vitamins-Minerals (CENTRUM SILVER PO) Take 1 tablet by mouth every morning.   . Multiple Vitamins-Minerals (PRESERVISION AREDS 2 PO) Take 1 tablet by mouth every evening.   No facility-administered encounter medications on file as of 08/31/2014.    No Known Allergies   Review of Systems        See HPI - all other systems neg except as noted... The patient complains of decreased hearing, dyspnea on exertion, and difficulty walking.  The patient denies anorexia, fever, weight loss, weight gain, vision loss, hoarseness, chest pain, syncope, peripheral edema, prolonged cough, headaches, hemoptysis, abdominal pain, melena, hematochezia, severe indigestion/heartburn, hematuria, incontinence, muscle weakness, suspicious skin lesions, transient blindness, depression, unusual weight change, abnormal bleeding, enlarged lymph nodes, and angioedema.    Objective:   Physical Exam     WD, WN, 81 y/o WM in NAD... GENERAL:  Alert & oriented; pleasant & cooperative... HEENT:  [place]/AT, EOM-wnl, PERRLA, EACs-clear, TMs-wnl, NOSE-clear, THROAT-clear & wnl. NECK:  Supple w/ fairROM; no JVD; normal carotid impulses w/ faint right bruit; no thyromegaly or  nodules palpated; no lymphadenopathy. CHEST:  Clear to P & A; without wheezes/ rales/ or rhonchi heard... HEART:  Regular, gr 1/6 SEM without rubs or gallops heard... ABDOMEN:  Soft & nontender; normal bowel sounds; no organomegaly or masses detected. EXT: without deformities, mod arthritic changes; no varicose veins/ venous insuffic/ or edema. he has bilat fem art bruits & minor pressure area betw right 4th & 5th toes... NEURO:  CN's intact;  no focal neuro deficits... DERM:  Lesion on medial side of right small toe, sl tender...  RADIOLOGY DATA:  Reviewed in the EPIC EMR & discussed w/ the patient...  LABORATORY DATA:  Reviewed in the EPIC EMR & discussed w/ the patient...   Assessment & Plan:    HBP>  BP is OK on the current regimen; continue same; he is also followed by the [facility]...  ASHD/ CAD>  Followed by [name] for [facility] & the [facility]; continue current meds...  ASPVD- Carotid art dis & PAD>  Decr ABIs & Carotid dis> on ASA 81mg /d; followed by [name] et al...  CHOL>  On Lipitor20 and FLP is at goals...  DM>  On diet alone & labs also followed by there [facility]; labs today showed BS=116, A1c=6.3.Marland KitchenMarland Kitchen  Renal Insuffic>  Creat stable in the 1.85-1.8 range on current meds (better off NSAIDs & HCTZ)...  Anemia>  Hg seems stable in the 10-11 range w/ ACD...   Patient's Medications  New Prescriptions   No medications on file  Previous Medications   AMLODIPINE (NORVASC) 5 MG TABLET    Take 1 tablet (5 mg total) by mouth every evening.   ASPIRIN EC 81 MG TABLET    Take 81 mg by mouth every morning.    ATORVASTATIN (LIPITOR) 20 MG TABLET    Take 20 mg by mouth every  evening.    CALCIUM CARBONATE-VITAMIN D (CALCIUM 600/VITAMIN D PO)    Take 1 tablet by mouth 2 (two) times daily.    FERROUS GLUCONATE (FERGON) 325 MG TABLET    Take 325 mg by mouth daily with breakfast.    FUROSEMIDE (LASIX) 20 MG TABLET    Take 20 mg by mouth daily as needed for fluid or edema.    GLUCOSE BLOOD  (ACCU-CHEK AVIVA PLUS) TEST STRIP    Use as instructed   LANCETS (ACCU-CHEK MULTICLIX) LANCETS    Use as instructed   LEVOTHYROXINE (SYNTHROID) 50 MCG TABLET    Take 1 tablet (50 mcg total) by mouth daily before breakfast.   LOSARTAN (COZAAR) 100 MG TABLET    Take 100 mg by mouth daily.   METOPROLOL TARTRATE (LOPRESSOR) 25 MG TABLET    Take 1 tablet (25 mg total) by mouth 2 (two) times daily.   MULTIPLE VITAMINS-MINERALS (CENTRUM SILVER PO)    Take 1 tablet by mouth every morning.    MULTIPLE VITAMINS-MINERALS (PRESERVISION AREDS 2 PO)    Take 1 tablet by mouth every evening.  Modified Medications   No medications on file  Discontinued Medications   No medications on file

## 2014-08-31 NOTE — Patient Instructions (Signed)
Today we updated your med list in our EPIC system...    Continue your current medications the same...  For the leg tightness>>    Continue your current meds and exercise program...    Continue your regular follow up appts w/ [name]...    Watch your legs/feet & avoid injury/ sores/ etc...  For the swelling>>    Remember- NO SALT!!!    Elevate your legs as often as poss...    Wear support hose when up & about...    Continue your low dose Lasix regularly...  Today we did your follow up blood work...    We will contact you w/ the results when available...   Call for any questions...  Let's plan a follow up visit in 65mo, sooner if needed for problems.Marland KitchenMarland Kitchen

## 2014-10-05 DIAGNOSIS — H35372 Puckering of macula, left eye: Secondary | ICD-10-CM | POA: Diagnosis not present

## 2014-10-05 DIAGNOSIS — H3531 Nonexudative age-related macular degeneration: Secondary | ICD-10-CM | POA: Diagnosis not present

## 2015-01-05 NOTE — Progress Notes (Signed)
This encounter was created in error - please disregard.

## 2015-01-14 ENCOUNTER — Other Ambulatory Visit: Payer: Self-pay | Admitting: Pulmonary Disease

## 2015-01-17 ENCOUNTER — Other Ambulatory Visit: Payer: Self-pay | Admitting: Pulmonary Disease

## 2015-01-24 ENCOUNTER — Encounter: Payer: Self-pay | Admitting: Cardiovascular Disease

## 2015-01-24 ENCOUNTER — Ambulatory Visit (INDEPENDENT_AMBULATORY_CARE_PROVIDER_SITE_OTHER): Payer: Medicare Other | Admitting: Cardiovascular Disease

## 2015-01-24 VITALS — BP 150/66 | HR 63 | Ht 69.0 in | Wt 161.1 lb

## 2015-01-24 DIAGNOSIS — R600 Localized edema: Secondary | ICD-10-CM | POA: Diagnosis not present

## 2015-01-24 DIAGNOSIS — R0602 Shortness of breath: Secondary | ICD-10-CM

## 2015-01-24 DIAGNOSIS — R5383 Other fatigue: Secondary | ICD-10-CM

## 2015-01-24 DIAGNOSIS — I6523 Occlusion and stenosis of bilateral carotid arteries: Secondary | ICD-10-CM | POA: Diagnosis not present

## 2015-01-24 MED ORDER — FUROSEMIDE 40 MG PO TABS: 40.0000 mg | ORAL_TABLET | Freq: Every day | ORAL | Status: DC

## 2015-01-24 NOTE — Patient Instructions (Signed)
Medication Instructions:  Your physician has recommended you make the following change in your medication:  1. INCREASE Furosemide to 40mg  take one tablet by mouth daily  Labwork: Your physician recommends that you return for lab work in: 1 WEEK (BMP)  Testing/Procedures: Your physician has requested that you have an echocardiogram in 1 WEEK. Echocardiography is a painless test that uses sound waves to create images of your heart. It provides your doctor with information about the size and shape of your heart and how well your heart's chambers and valves are working. This procedure takes approximately one hour. There are no restrictions for this procedure.  Follow-Up: Your physician wants you to follow-up in: 6 MONTHS with Dr Burt Knack.  You will receive a reminder letter in the mail two months in advance. If you don't receive a letter, please call our office to schedule the follow-up appointment.  Any Other Special Instructions Will Be Listed Below (If Applicable).     If you need a refill on your cardiac medications before your next appointment, please call your pharmacy.

## 2015-01-24 NOTE — Progress Notes (Signed)
Cardiology Office Note Date:  01/24/2015   ID:  Henry [name], DOB [birth year], MRN [id number]  PCP:  Noralee Space, MD  Cardiologist:  Sherren Mocha, MD    Chief Complaint  Patient presents with  . Shortness of Breath    History of Present Illness: Henry [name] is a 81 y.o. male who presents for follow-up of coronary artery disease with remote CABG. He's also followed for peripheral arterial disease with intermittent claudication, hypertension, hyperlipidemia, and chronic kidney disease.  Since the patient was seen last year , he has developed symptoms of fatigue and shortness of breath with exercise. He denies orthopnea, PND, or leg swelling. He denies chest pain or pressure. He has not had cough or fever.  The patient had recent lab evaluation through the [place] [facility]. His hemoglobin A1c is 6.3, creatinine is 2.28 which is stable , and BNP was elevated. He was given additional furosemide and advised to follow-up here.  Past Medical History  Diagnosis Date  . Hypertension   . CAD of autologous bypass graft   . Carotid artery disease ([place])   . PVD (peripheral vascular disease) with claudication ([place])   . Hypercholesteremia   . Renal insufficiency   . DJD (degenerative joint disease)   . History of anemia of chronic disease   . Diabetes mellitus without complication [facility])     Past Surgical History  Procedure Laterality Date  . Cataract surgery    . Coronary artery bypass graft  2000    by Dr. Cyndia Bent  . Tonsillectomy    . Colonoscopy      Hx: of  . Lumbar laminectomy/decompression microdiscectomy N/A 09/10/2012    Procedure: LUMBAR DECOMPRESSION,  IN SITU FUSION LUMBAR 4-5;  Surgeon: Melina Schools, MD;  Location: [place];  Service: Orthopedics;  Laterality: N/A;    Current Outpatient Prescriptions  Medication Sig Dispense Refill  . ACCU-CHEK AVIVA PLUS test strip CHECK BLOOD SUGAR TWICE A DAY. 50 each PRN  . amLODipine (NORVASC) 10 MG tablet Take 10 mg by  mouth daily.    Marland Kitchen aspirin EC 81 MG tablet Take 81 mg by mouth every morning.     Marland Kitchen atorvastatin (LIPITOR) 20 MG tablet Take 10 mg by mouth every evening.     . Calcium Carbonate-Vitamin D (CALCIUM 600/VITAMIN D PO) Take 1 tablet by mouth 2 (two) times daily.     . ferrous gluconate (FERGON) 325 MG tablet Take 325 mg by mouth daily with breakfast.     . furosemide (LASIX) 40 MG tablet Take 1 tablet (40 mg total) by mouth daily. 90 tablet 3  . Lancets (ACCU-CHEK MULTICLIX) lancets USE TWICE DAILY. 102 each PRN  . levothyroxine (SYNTHROID) 50 MCG tablet Take 1 tablet (50 mcg total) by mouth daily before breakfast. 90 tablet 3  . losartan (COZAAR) 100 MG tablet Take 100 mg by mouth daily.    . metoprolol tartrate (LOPRESSOR) 25 MG tablet Take 1 tablet (25 mg total) by mouth 2 (two) times daily. 60 tablet 5  . Multiple Vitamins-Minerals (CENTRUM SILVER PO) Take 1 tablet by mouth every morning.     . Multiple Vitamins-Minerals (PRESERVISION AREDS 2 PO) Take 1 tablet by mouth every evening.     No current facility-administered medications for this visit.    Allergies:   Review of patient's allergies indicates no known allergies.   Social History:  The patient  reports that he quit smoking about 61 years ago. His smoking use included Cigarettes. He  has a 10 pack-year smoking history. He has never used smokeless tobacco. He reports that he drinks alcohol. He reports that he does not use illicit drugs.   Family History:  The patient's  family history is not on file.   ROS:  Please see the history of present illness.  Otherwise, review of systems is positive for  Leg swelling, shortness of breath with activity , dizziness, excessive fatigue, leg pain, joint swelling, balance problems.  All other systems are reviewed and negative.   PHYSICAL EXAM: VS:  BP 150/66 mmHg  Pulse 63  Ht 5\' 9"  (1.753 m)  Wt 161 lb 1.9 oz (73.084 kg)  BMI 23.78 kg/m2 , BMI Body mass index is 23.78 kg/(m^2). GEN: Well  nourished, well developed, pleasant elderly male in no acute distress HEENT: normal Neck: no JVD, no masses. bilateral carotid bruits Cardiac: RRR without murmur or gallop                Respiratory:  clear to auscultation bilaterally, normal work of breathing GI: soft, nontender, nondistended, + BS MS: no deformity or atrophy Ext: no pretibial edema Skin: warm and dry, no rash Neuro:  Strength and sensation are intact Psych: euthymic mood, full affect  EKG:  EKG is ordered today. The ekg ordered today shows  Normal sinus rhythm 63 bpm with first-degree AV block , nonspecific ST abnormality. Possible age-indeterminate inferior MI  Recent Labs: 03/03/2014: ALT 16 08/31/2014: BUN 28*; Creatinine, Ser 2.01*; Hemoglobin 10.0*; Platelets 212.0; Potassium 5.3*; Sodium 136; TSH 3.62   Lipid Panel     Component Value Date/Time   CHOL 164 03/03/2014 0908   TRIG 151.0* 03/03/2014 0908   HDL 35.60* 03/03/2014 0908   CHOLHDL 5 03/03/2014 0908   VLDL 30.2 03/03/2014 0908   LDLCALC 98 03/03/2014 0908   LDLDIRECT 96.2 04/04/2007 1049      Wt Readings from Last 3 Encounters:  01/24/15 161 lb 1.9 oz (73.084 kg)  08/31/14 167 lb 3.2 oz (75.841 kg)  03/01/14 169 lb (76.658 kg)    Cardiac Studies Reviewed: Myoview scan 09/03/2012: Impression Exercise Capacity: Lexiscan with no exercise. BP Response: Normal blood pressure response. Clinical Symptoms: No chest pain. ECG Impression: No significant ST segment change suggestive of ischemia. Comparison with Prior Nuclear Study: No images to compare  Overall Impression: Normal stress nuclear study.  LV Ejection Fraction: 61%. LV Wall Motion: NL LV Function; NL Wall Motion  ABIs 02/18/2014: 0.79 on the right, 0.71 on the left. Stable from previous studies.  ASSESSMENT AND PLAN: 1.  CAD, native vessel, status post remote CABG: The patient is stable without symptoms of angina. He will continue on daily aspirin and a beta blocker as well as  a statin drug.  2. Essential hypertension: Home blood pressures reviewed and they are in a good range of 120s over 70s.  3. Acute diastolic congestive heart failure: The patient brings in lab work from the [facility]. He has an elevated BNP. He endorses exertional fatigue and shortness of breath as well as leg swelling. Symptoms are improved with increased furosemide. Recommend that he continues on metoprolol and losartan. Change furosemide to 40 mg daily. Check an echocardiogram and repeat metabolic panel next week to make sure that renal function is stable on an increased dose of Lasix.  3. Lower extremity peripheral arterial disease with intermittent claudication: No change in chronic symptoms. ABIs last year are in the range of 0.7 bilaterally  4. Hyperlipidemia: Treated with atorvastatin. Lipids from  January reviewed with an LDL of 98, total cholesterol 164.  5. CKD stage III: Recent lab work from the [place] system reviewed.  Current medicines are reviewed with the patient today.  The patient does not have concerns regarding medicines.  Labs/ tests ordered today include:   Orders Placed This Encounter  Procedures  . Basic Metabolic Panel (BMET)  . EKG 12-Lead  . Echocardiogram    Disposition:   FU 6 months  Signed, Sherren Mocha, MD  01/24/2015 12:27 PM    [facility] [address], [place], [place]  [address] Phone: [phone]; Fax: [phone]

## 2015-01-31 ENCOUNTER — Other Ambulatory Visit (INDEPENDENT_AMBULATORY_CARE_PROVIDER_SITE_OTHER): Payer: Medicare Other

## 2015-01-31 ENCOUNTER — Other Ambulatory Visit (HOSPITAL_COMMUNITY): Payer: Medicare Other

## 2015-01-31 DIAGNOSIS — R5383 Other fatigue: Secondary | ICD-10-CM | POA: Diagnosis not present

## 2015-01-31 DIAGNOSIS — R600 Localized edema: Secondary | ICD-10-CM

## 2015-01-31 DIAGNOSIS — R0602 Shortness of breath: Secondary | ICD-10-CM

## 2015-01-31 DIAGNOSIS — R609 Edema, unspecified: Secondary | ICD-10-CM | POA: Diagnosis not present

## 2015-01-31 LAB — BASIC METABOLIC PANEL
BUN: 39 mg/dL — ABNORMAL HIGH (ref 7–25)
CALCIUM: 9.3 mg/dL (ref 8.6–10.3)
CHLORIDE: 104 mmol/L (ref 98–110)
CO2: 28 mmol/L (ref 20–31)
CREATININE: 2.37 mg/dL — AB (ref 0.70–1.11)
Glucose, Bld: 110 mg/dL — ABNORMAL HIGH (ref 65–99)
Potassium: 4.1 mmol/L (ref 3.5–5.3)
Sodium: 138 mmol/L (ref 135–146)

## 2015-02-08 ENCOUNTER — Other Ambulatory Visit: Payer: Self-pay

## 2015-02-08 ENCOUNTER — Ambulatory Visit (HOSPITAL_COMMUNITY): Payer: Medicare Other | Attending: Cardiology

## 2015-02-08 DIAGNOSIS — I340 Nonrheumatic mitral (valve) insufficiency: Secondary | ICD-10-CM | POA: Insufficient documentation

## 2015-02-08 DIAGNOSIS — R0602 Shortness of breath: Secondary | ICD-10-CM

## 2015-02-08 DIAGNOSIS — R600 Localized edema: Secondary | ICD-10-CM | POA: Diagnosis not present

## 2015-02-08 DIAGNOSIS — R0600 Dyspnea, unspecified: Secondary | ICD-10-CM | POA: Diagnosis present

## 2015-02-08 DIAGNOSIS — I352 Nonrheumatic aortic (valve) stenosis with insufficiency: Secondary | ICD-10-CM | POA: Diagnosis not present

## 2015-02-08 DIAGNOSIS — R5383 Other fatigue: Secondary | ICD-10-CM | POA: Diagnosis not present

## 2015-02-08 DIAGNOSIS — I517 Cardiomegaly: Secondary | ICD-10-CM | POA: Insufficient documentation

## 2015-02-22 ENCOUNTER — Other Ambulatory Visit: Payer: Self-pay | Admitting: Pulmonary Disease

## 2015-02-22 DIAGNOSIS — I6523 Occlusion and stenosis of bilateral carotid arteries: Secondary | ICD-10-CM

## 2015-03-03 ENCOUNTER — Ambulatory Visit: Payer: Medicare Other | Admitting: Pulmonary Disease

## 2015-03-07 DIAGNOSIS — H353132 Nonexudative age-related macular degeneration, bilateral, intermediate dry stage: Secondary | ICD-10-CM | POA: Diagnosis not present

## 2015-03-07 DIAGNOSIS — H35363 Drusen (degenerative) of macula, bilateral: Secondary | ICD-10-CM | POA: Diagnosis not present

## 2015-03-07 DIAGNOSIS — H35372 Puckering of macula, left eye: Secondary | ICD-10-CM | POA: Diagnosis not present

## 2015-03-07 DIAGNOSIS — E119 Type 2 diabetes mellitus without complications: Secondary | ICD-10-CM | POA: Diagnosis not present

## 2015-03-19 DIAGNOSIS — S8992XA Unspecified injury of left lower leg, initial encounter: Secondary | ICD-10-CM | POA: Diagnosis not present

## 2015-03-19 DIAGNOSIS — S99912A Unspecified injury of left ankle, initial encounter: Secondary | ICD-10-CM | POA: Diagnosis not present

## 2015-03-22 ENCOUNTER — Ambulatory Visit (HOSPITAL_COMMUNITY)
Admission: RE | Admit: 2015-03-22 | Discharge: 2015-03-22 | Disposition: A | Discharge status: Home / Self Care | Payer: Medicare Other | Source: Ambulatory Visit | Attending: Cardiology | Admitting: Cardiology

## 2015-03-22 DIAGNOSIS — E7800 Pure hypercholesterolemia, unspecified: Secondary | ICD-10-CM | POA: Insufficient documentation

## 2015-03-22 DIAGNOSIS — I10 Essential (primary) hypertension: Secondary | ICD-10-CM | POA: Insufficient documentation

## 2015-03-22 DIAGNOSIS — E119 Type 2 diabetes mellitus without complications: Secondary | ICD-10-CM | POA: Insufficient documentation

## 2015-03-22 DIAGNOSIS — I6523 Occlusion and stenosis of bilateral carotid arteries: Secondary | ICD-10-CM | POA: Diagnosis not present

## 2015-03-23 ENCOUNTER — Ambulatory Visit (INDEPENDENT_AMBULATORY_CARE_PROVIDER_SITE_OTHER): Payer: Medicare Other | Admitting: Pulmonary Disease

## 2015-03-23 ENCOUNTER — Other Ambulatory Visit: Payer: Self-pay | Admitting: Pulmonary Disease

## 2015-03-23 ENCOUNTER — Ambulatory Visit (INDEPENDENT_AMBULATORY_CARE_PROVIDER_SITE_OTHER)
Admission: RE | Admit: 2015-03-23 | Discharge: 2015-03-23 | Disposition: A | Discharge status: Home / Self Care | Payer: Medicare Other | Source: Ambulatory Visit | Attending: Pulmonary Disease | Admitting: Pulmonary Disease

## 2015-03-23 ENCOUNTER — Encounter: Payer: Self-pay | Admitting: Pulmonary Disease

## 2015-03-23 ENCOUNTER — Other Ambulatory Visit (INDEPENDENT_AMBULATORY_CARE_PROVIDER_SITE_OTHER): Payer: Medicare Other

## 2015-03-23 VITALS — BP 138/66 | HR 62 | Temp 97.9°F | Ht 68.0 in | Wt 163.2 lb

## 2015-03-23 DIAGNOSIS — I25718 Atherosclerosis of autologous vein coronary artery bypass graft(s) with other forms of angina pectoris: Secondary | ICD-10-CM

## 2015-03-23 DIAGNOSIS — D638 Anemia in other chronic diseases classified elsewhere: Secondary | ICD-10-CM | POA: Diagnosis not present

## 2015-03-23 DIAGNOSIS — G319 Degenerative disease of nervous system, unspecified: Secondary | ICD-10-CM

## 2015-03-23 DIAGNOSIS — I6523 Occlusion and stenosis of bilateral carotid arteries: Secondary | ICD-10-CM

## 2015-03-23 DIAGNOSIS — E039 Hypothyroidism, unspecified: Secondary | ICD-10-CM

## 2015-03-23 DIAGNOSIS — N183 Chronic kidney disease, stage 3 unspecified: Secondary | ICD-10-CM

## 2015-03-23 DIAGNOSIS — E119 Type 2 diabetes mellitus without complications: Secondary | ICD-10-CM

## 2015-03-23 DIAGNOSIS — I10 Essential (primary) hypertension: Secondary | ICD-10-CM

## 2015-03-23 DIAGNOSIS — E7800 Pure hypercholesterolemia, unspecified: Secondary | ICD-10-CM

## 2015-03-23 DIAGNOSIS — M159 Polyosteoarthritis, unspecified: Secondary | ICD-10-CM

## 2015-03-23 DIAGNOSIS — I739 Peripheral vascular disease, unspecified: Secondary | ICD-10-CM | POA: Diagnosis not present

## 2015-03-23 DIAGNOSIS — I70219 Atherosclerosis of native arteries of extremities with intermittent claudication, unspecified extremity: Secondary | ICD-10-CM

## 2015-03-23 DIAGNOSIS — M150 Primary generalized (osteo)arthritis: Secondary | ICD-10-CM

## 2015-03-23 DIAGNOSIS — Z0000 Encounter for general adult medical examination without abnormal findings: Secondary | ICD-10-CM | POA: Diagnosis not present

## 2015-03-23 LAB — CBC WITH DIFFERENTIAL/PLATELET
BASOS ABS: 0.1 10*3/uL (ref 0.0–0.1)
Basophils Relative: 0.7 % (ref 0.0–3.0)
EOS ABS: 0.4 10*3/uL (ref 0.0–0.7)
Eosinophils Relative: 4.7 % (ref 0.0–5.0)
HEMATOCRIT: 31 % — AB (ref 39.0–52.0)
HEMOGLOBIN: 10.2 g/dL — AB (ref 13.0–17.0)
LYMPHS PCT: 19.8 % (ref 12.0–46.0)
Lymphs Abs: 1.6 10*3/uL (ref 0.7–4.0)
MCHC: 32.9 g/dL (ref 30.0–36.0)
MCV: 90.7 fl (ref 78.0–100.0)
MONOS PCT: 12.4 % — AB (ref 3.0–12.0)
Monocytes Absolute: 1 10*3/uL (ref 0.1–1.0)
NEUTROS ABS: 5.2 10*3/uL (ref 1.4–7.7)
Neutrophils Relative %: 62.4 % (ref 43.0–77.0)
PLATELETS: 249 10*3/uL (ref 150.0–400.0)
RBC: 3.42 Mil/uL — AB (ref 4.22–5.81)
RDW: 13 % (ref 11.5–15.5)
WBC: 8.3 10*3/uL (ref 4.0–10.5)

## 2015-03-23 LAB — IBC PANEL
Iron: 93 ug/dL (ref 42–165)
SATURATION RATIOS: 29.7 % (ref 20.0–50.0)
TRANSFERRIN: 224 mg/dL (ref 212.0–360.0)

## 2015-03-23 LAB — HEMOGLOBIN A1C: HEMOGLOBIN A1C: 6.6 % — AB (ref 4.6–6.5)

## 2015-03-23 LAB — BASIC METABOLIC PANEL
BUN: 29 mg/dL — ABNORMAL HIGH (ref 6–23)
CALCIUM: 9.4 mg/dL (ref 8.4–10.5)
CO2: 28 mEq/L (ref 19–32)
CREATININE: 1.94 mg/dL — AB (ref 0.40–1.50)
Chloride: 105 mEq/L (ref 96–112)
GFR: 34.82 mL/min — AB (ref >60.00)
GLUCOSE: 105 mg/dL — AB (ref 70–99)
Potassium: 4.8 mEq/L (ref 3.5–5.1)
Sodium: 138 mEq/L (ref 135–145)

## 2015-03-23 LAB — HEPATIC FUNCTION PANEL
ALBUMIN: 4.2 g/dL (ref 3.5–5.2)
ALK PHOS: 99 U/L (ref 39–117)
ALT: 10 U/L (ref 0–53)
AST: 17 U/L (ref 0–37)
Bilirubin, Direct: 0.1 mg/dL (ref 0.0–0.3)
TOTAL PROTEIN: 7.3 g/dL (ref 6.0–8.3)
Total Bilirubin: 0.6 mg/dL (ref 0.2–1.2)

## 2015-03-23 LAB — LIPID PANEL
Cholesterol: 145 mg/dL (ref 0–200)
HDL: 40.3 mg/dL (ref >39.00)
LDL Cholesterol: 77 mg/dL (ref 0–99)
NONHDL: 104.85
TRIGLYCERIDES: 141 mg/dL (ref 0.0–149.0)
Total CHOL/HDL Ratio: 4
VLDL: 28.2 mg/dL (ref 0.0–40.0)

## 2015-03-23 LAB — FERRITIN: Ferritin: 187.2 ng/mL (ref 22.0–322.0)

## 2015-03-23 MED ORDER — ACCU-CHEK MULTICLIX LANCETS MISC: Status: DC

## 2015-03-23 MED ORDER — GLUCOSE BLOOD VI STRP: ORAL_STRIP | Status: DC

## 2015-03-23 NOTE — Progress Notes (Signed)
Subjective:    Patient ID: Henry [name], male    DOB: Aug 10, 1924, 82 y.o.   MRN: [id number]  HPI 82 y/o WM here for a follow up visit... he has mult med problems as listed and is followed by DrBBrodie and DrPerry as well... he gets his meds filled at the [facility] which makes for a complicated array of perscribed meds and substituted alternatives... ~  SEE PREV EPIC NOTES FOR OLDER DATA >>    CXR 6/14 showed normal heart size, tortuous Ao, mild COPD changes w/ clear lungs/ NAD, DJD in Tspine...   LABS 6/14:  Chems- ok x BS=132 A1c=6.6 Creat=1.7;  CBC- Hg=11.3;  TSH=5.84;  B12=530;  BNP=198...  LABS 12/14:  FLP- at goals on Lip20;  Chems- ok x BS=131, A1c=6.9, Cr=1.8;  CBC= Hg= 10.0;  TSH=8.74 & rec to start Synthroid50...  LABS 7/15:  Chems- ok x BS=129, A1c=6.5, BUN=25, Cr=1.8;  CBC- ok x Hg=10.4, Fe=84 (27%sat), Ferritin=104;  TSH=3.50...  ~  March 01, 2014:  24mo ROV & Henry [name] has had recent f/u w/ [name] with adjustment in meds- [place]; he still has some edema & not taking his Lasix20 regularly;  We reviewed the following medical problems during today's office visit >>     HBP> on Metop25Bid, Amlod5, Losar100, Lasix20 (prn);  BP= 124/68 & he has persistent edema; denies CP, palpit, ch in SOB, etc...    ASHD> on ASA81; known CAD & s/p CABG 2000; followed by [name] & last seen 12/15- norm Myoview 7/14; he denies CP/ angina, palpit, SOB; needs to take Lasix20 daily for the edema.    Carotid Art Dis> on ASA81; he denies cerebral ischemic symptoms; CT/ MRI Brain 2/14 w/ atrophy & sm vessel dis; last CDoppler 1/13 showed mild heterogeneous plaque bilat w/ 40-59% bilat ICAstenoses & f/u is overdue...     Periph Vasc Dis> on ASA81; he has stable claud w/ ambulation; last Art Dopplers 12/15 showed ABIs .79R & .71L, TBIs were abn but w/o risk of tissue loss; overdue for yearly f/u studies...    CHOL> on Lip20 (off prev Crestor); Labs 1/16 showed TChol 164, TG 151, HDL 36, LDL 98...  Needs better diet.    DM> on diet alone (off prev Metform); Labs 12/14 showed BS= 131, A1c=6.9; Labs 1/16 showed BS= 127; we reviewed diet, exercise, etc....    Renal Insuffic> he knows to avoid NSAIDs; Labs 12/14 w/ Creat= 1.8 and Labs 1/16 showed Cr= 1.5    DJD, LBP w/ spinal stenosis> on Tramadol50 prn; followed by DrRamos=> DrBrooks at [facility]; they did MRI w/ report of sp stenosis severe at L4-5; surg 7/14 w/ lumbar decompression & fusion L4-5...    Anemia> on FeSO4 325mg /d; Labs 1/16 w/ Hg= 10.1 stable... We reviewed prob list, meds, xrays and labs> see below for updates >> he will get flu shot at [facility].  CXR 1/16 showed norm heart size, prior CABG, COPD w/ scarring at the bases, NAD...  LABS 1/16:  FLP- ok on Atorva20;  Chems- ok w/ stable Cr=1.5, BS=127;  CBC- mild chr anemia w/ Hg=10.1;  B12=408; TSH=9.10 off meds...  ~  August 31, 2014:  33mo ROV & Henry [name] has had a good interval- his CC's are "tightness in my legs" esp w/ walking & he has known ASPVD w/ f/u [name] soon, and feeling sl better on the Synthroid50 regularly... We reviewed the following medical problems during today's office visit >>     HBP> on Metop25Bid,  Amlod5, Losar100, Lasix20 (prn);  BP= 114/56 & he has persistent mild edema; denies CP, palpit, ch in SOB, etc...    ASHD> on ASA81; known CAD & s/p CABG 2000; followed by [name] & last seen 12/15- norm Myoview 7/14; he denies CP/ angina, palpit, SOB; needs to take Lasix20 daily for the edema.    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    Periph Vasc Dis> on ASA81; he has stable claud w/ ambulation; last Art Dopplers 12/15 showed ABIs .79R & .71L, TBIs were abn but w/o risk of tissue loss; overdue for yearly f/u studies...    CHOL> on Lip20 (off prev Crestor); Labs 1/16 showed TChol  164, TG 151, HDL 36, LDL 98... Needs better diet.    DM> on diet alone (off prev Metform); Labs 1/16 showed BS= 127; Labs 7/16 showed BS=116, A1c=6.3; we reviewed diet, exercise, etc....    Renal Insuffic> he knows to avoid NSAIDs; Labs 12/14 w/ Creat= 1.8 and Labs 1/16 showed Cr= 1.5; Labs 7/16 w/ Cr=2.0 & rec to incr water intake...    DJD, LBP w/ spinal stenosis> on Tramadol50 prn; followed by DrRamos=> DrBrooks at [facility]; they did MRI w/ report of sp stenosis severe at L4-5; surg 7/14 w/ lumbar decompression & fusion L4-5...    Anemia> on FeSO4 325mg /d; Labs 1/16 w/ Hg= 10.1 stable; Labs 7/16 showed Hg=10, Fe=57 (20%sat), Ferritin=109; continue same... We reviewed prob list, meds, xrays and labs> see below for updates >>   LABS 7/16:  Chems- ok w/ BS=116, A1c=6.3, BUN=28, Cr=2.0;  CBC- anemic w/ Hg=10.0, Fe=57 (20%), Ferritin=109;  TSH=3.62... PLAN>>  Continue same meds, continue f/u [name], ROV w/ me in 3mo...  ~  March 23, 2015:  59mo ROV & Henry [name] returns for routine ROV- he tells me he recently fell on the ice & hurt left leg, ortho eval was neg for any fxs; otherw feeling well & he denies CP, palpit, SOB, edema; he denies cough, sput, hemoptysis, etc;  hw had Cards f/u w/ [name] 12/2014> f/u HBP, CAD, remote CABG, HL, ASPVD w/ claudication, CRI w/ Cr~2> BNP reported elev at [facility] & he was given more Lasix (40mg /d); f/u 2DEcho 02/08/15 showed mild LVH, norm LVF w/ EF=55-60%, no regional wall motion abn, Gr1DD, mod AoV calcif & mild AS/ trivAI, mild MR, mild LAdil, norm RVF & PAsys=29...    EXAM shows Afeb, VSS, O2sat=98% on RA;  HEENT- neg, mallampati2;  Chest- bibasilar crackles ~1/3rd way up w/o w/r;  Heart- RR gr1/6 SEM at apex & base, w/o r/g;  Abd- soft, neg;  Ext- left ankle swelling  EKG 12/2014 showed NSR, rate 63/min, Q in III, NSSTTWA, NAD...  2DEcho 02/08/15 showed mild LVH, norm LVF w/ EF=55-60%, no regional wall motion abn, Gr1DD, mod AoV calcif & mild AS/ trivAI, mild MR,  mild LAdil, norm RVF & PAsys=29  CDopplers 03/22/15>  bilat heterogen plaque, 40-59% bilat ICAstenoses- stable; norm subclav & vertebrals...   CXR 03/23/15>  Heart at upper lim of norm & s/p CABG; underlying COPD w/ flatening of diaph & chr incr markings at bases; mild DDD in Tspine; NAD- s/p CABG & chronic changes...  LABS 03/23/15>  FLP- all parameters at goals on Lip10;  Chems- ok x BS=105, A1c=6.6, BUN=29, Cr=1.94, LFTs=wnl;  CBC- stable anemia w/ Hg=10.2, MCV=91; Fe=93 (  30%sat), Ferritin=187, SPE/IEP- neg, no monoclonal prot... IMP/PLAN>>  Henry [name] is stable on his current regimen- continue same; reminded to be careful during the winter months; we plan ROV recheck in 62months...          Problem List:  HYPERTENSION (ICD-401.9) >>  ~  controlled on 4 meds:  METOPROLOL 25mg Bid, AMLODIPINE 10mg /d, LOSARTAN 25mg /d, and HCT 25mg - 1/2 qd...  ~  [place] showed mild LVH, AoV sclerosis & mild AI, EF= 60%... ~  labs 11/11 showed BUN=36, Creat=2.1, K=5.1.Marland KitchenMarland Kitchen rec to stop HCTZ... ~  [facility] subseq re-started 12.5mg  HCTZ in the interim due to edema... ~  6/14:  on Metop25/d, Amlod10, Losar25, Lasix20;  BP= 160/64 but he is in pain & ?took his meds today; he denies CP, palpit, ch in SOB, edema, etc; asked to incr Metop25Bid.  ~  CXR 6/14 showed norm heart size, atherosclerotic & tort Ao, emphysematous changes in upper lobes & scarring in lower lobes, DJD in spine, NAD... ~  12/14: on Metop25Bid, Amlod10, Losar25-3/d, Lasix20;  BP= 144/60 & he denies CP, palpit, ch in SOB, edema, etc. ~  7/15: BP controlled on Metop25Bid, Amlod10, Losar25-3/d, Lasix20;  BP= 120/74 7 he denies CP, palpit, dyspnea, etc; Labs showed K=4.7, BUN=25, Cr=1.8 stable. ~  1/16: on Metop25Bid, Amlod5, Losar100, Lasix20 (prn);  BP= 124/68 & he has persistent edema; denies CP, palpit, ch in SOB, etc. ~  7/16: on Metop25Bid, Amlod5, Losar100, Lasix20 (prn);  BP= 114/56 & he remains stable...  ARTERIOSCLEROTIC HEART DISEASE (ICD-414.00) -  on ASA 81mg /d... long time pt of DrJoeLeBauer & followed by DrBrodie now... s/p CABG in 2000 by DrBartle (for L main dis)... he is active & w/o angina, palpit, change in SOB/ DOE, etc... ~  NuclearStressTest 9/08 was normal without ischemia and EF=68%... ~  12/13: he saw [name] for f/u CAD, s/p CABG, PAD, Carotid Art dis> no angina, bilat calf pain w/ ambulation but felt to be stable; [facility], but [facility]... ~  EKG 6/14 showed SBrady, rate56, 1st degree AVB, otherw wnl... ~  7/14:  Myoview showed normal wall motion & EF=61%, no CP or EKG changes... 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 ~  1/16: on ASA81; known CAD & s/p CABG 2000; followed by [name] & last seen 12/15- norm Myoview 7/14; he denies CP/ angina, palpit, SOB; needs to take Lasix20 daily for the edema.  CAROTID ARTERY DISEASE>  PERIPHERAL VASCULAR DISEASE (ICD-443.9) - on ASA daily and he has bilat femoral bruits... he has seen DrDowney in the past (now [name]) w/ exerc program recommended... he walks regularly w/ stable claud symptoms, no sores or lesions on LE's... ~  ABI's 10/08 were normal bilat = 1.0.Marland KitchenMarland Kitchen waveforms were biphasic however (no change from 2005). ~  CDoppler's 10/08 showed mild bilat carotid dis w/ 40-59% RICA stenosis, and [id number] LICA stenosis. ~  CDoppler's 10/09 showed the same= stable. ~  CDoppler's 11/10 showed stable mild carotid dis bilat> [id number] RICA & [id number] LICA stenoses. ~  ABI's 11/10 showed prob mod severe >50% distal right SFA stenosis, & mod dis in left SFA, ABI's=0.9 ~  Podiarty sent him to [facility] for ArtDopplers 9/11> ABIs 0.82 R & 0.74L w/ bilat SFA dis L>R, toe pressures adeq for healing... ~  6/14: on ASA81; he denies cerebral ischemic symptoms; CT/ MRI Brain 2/14  showed sm vessel dis, vasc calcif, global atrophy (no hemorrhage or large area infarct); last  CDoppler 1/13 showed mild heterogeneous plaque bilat w/ 40-59% bilat ICAstenoses... ~  6/14: on ASA81; he has stable claud w/ ambulation but lim by back pain now; last Art Dopplers 1/13 showed ABIs .92R & .80L, TBIs were abn but w/o risk of tissue loss; overdue for yearly f/u studies of Carotids & ABIs... ~  09/24/2022: He has ASHD & ASPVD on ASA81; as noted he is active & denies angina etc; he saw [name] 12/14 & stable, continue medical therapy & exercise. ~  CDoppler 1/16 showed mild heterogeneous plaque bilat w/ 40-59% bilat ICAstenoses & norm subclav/ antegrade verts.  HYPERCHOLESTEROLEMIA (ICD-272.0) - prev controlled on Simva40 + diet, [facility] CRESTOR 10mg /d. ~  [place] 2/09 on Simva40 showed Tchol 169, TG 206, HDL 37, LDL 96 ~  FLP 4/10 on Simva40 showed TChol 146, TG 145, HDL 36, LDL 82 ~  FLP 10/10 on Simva40 showed TChol 166, TG 130, HDL 38, LDL 102 ~  FLP 4/11 on Simva40 showed TChol 138, TG 87, HDL 40, LDL 80... [facility] subseq ch to Cres10 ~  FLP 11/11 on Cres10 showed TChol 147, TG 161, HDL 34, LDL 81 ~  6/12:  He indicates interval labs done by the [facility] but he didn't bring copies for [place] to review... ~  6/14: on Lip20 (off prev Crestor); Labs done at [facility] but he didn't bring reports; he is not fasting today for recheck.  ~  [place] 12/14 on Lip20 showed TChol 127, TG 56, HDL 41, LDL 75  ~  FLP 1/16 on Lip20 showed TChol 164, TG 151, HDL 36, LDL 98  DIABETES MELLITUS (ICD-250.00) - he's struggled w/ diet Rx... wt stable ~175 range... prev on Metformin 500Bid but [facility] changed to GLYBURIDE 5mg /d, then decr to 1/2 tab daily due to side effects & he stopped due to hypogly. ~  labs 10/08 showed FBS= 134, and HgA1c= 5.4... ~  labs 6/09 showed BS= 101, HgA1c= 6.4.Marland KitchenMarland Kitchen monofilament test is normal. ~  [facility] eval 1/10 [facility] showed no DM retinopathy... ~  labs 4/10 on MetformBid showed BS= 106, A1c= 6.8, Urine Microalb= sl incr @ 7.8mg % ~  labs 10/10 showed BS= 119, A1c= 6.5...  NOTE: 2/11 [facility] ch  to Glybur5mg - 1/2 daily. ~  labs 4/11 on Glyb2.5 showed BS= 97, A1c= 6.6.Marland KitchenMarland Kitchen pt subseq stopped Glybur due to low sugars. ~  labs 11/11 off meds showed BS= 149, A1c= 6.7.Marland KitchenMarland Kitchen coninue diet Rx. ~  6/12:  He indicates interim labs done by the [facility] but he didn't bring copies for [place] to review... ~  6/14: on diet alone (off prev Metform); Labs 2/14 Hosp w/ BS= 150-200+ range; now BS=132, A1c=6.6; he follows low carb diet ~  Labs 12/14 on diet alone (weight up 13# to 173#) showed BS= 131, A1c= 6.9 ~  2022-09-24: He has DM controlled on diet alone;weight stable at 171#, BMI=25-6; Labs today showed BS=129, A1c=6.5; continue diet management. ~  1/16: on diet alone (off prev Metform); Labs 12/14 showed BS= 131, A1c=6.9; Labs 1/16 showed BS= 127; we reviewed diet, exercise, etc ~  7/16:  On diet alone> Labs 7/16 showed BS=116, A1c=6.3; we reviewed diet, exercise, etc...  BORDERLINE TSH >> ~  Serial labs showed rising TSH> 3.57 => 5.84 => 8.74 (12/14) and we decided to start Synthroid23mcg/d;  [facility]how the [facility] told [facility] to [facility] this [facility] & he did  not call to check w/ [place]... ~  7/15  Follow up TSH= 3.50 not on meds, clinically euthyroid, good energy, etc;  we will follow closely...  ~  1/16:  Labs here 1/16 showed TSH= 9.10 & he is requested to restart the Levothy55mcg/d... ~  7/16: now on Levothy50 every day> Labs 7/16 showed TSH= 3.62, continue same.  RENAL INSUFFICIENCY (ICD-588.9) - Creat 1.8-2.0 in the past... ~  labs 6/09 showed BUN= 31, Creat= 1.6... ~  labs 9/09 showed BUN= 25, Creat= 1.6, K= 4.5 ~  labs 4/10 showed BUN= 26, Creat= 1.6, K= 5.0 ~  labs 10/10 showed BUN= 21, Creat= 1.6 ~  labs 4/11 showed BUN= 30, Creat= 1.9..Marland Kitchen advised to stay well hydrated. ~  labs 11/11 showed BUN= 36, Creat= 2.1.Marland KitchenMarland Kitchen rec to stop the HCTZ ~  6/12:  He indicates that the [facility] restarted HCTZ 12.5mg /d & they are following labs... ~  6/14: he knows to avoid NSAIDs; Labs 2/14 w/ Creat= 1.4-1.6 range; Labs today showed BUN=  21, Creat= 1.7... ~  Labs 12/14 showed BUN= 23, Creat= 1.8 ~  Labs 7/15 showed BUN= 25, Cr= 1.8 ~  Labs 1/16 showed BUN= 25, Cr= 1.5 ~  Labs 7/16 showed BUN= 28, Cr= 2.0  DEGENERATIVE JOINT DISEASE (ICD-715.90) LOW BACK PAIN >> he has severe sp stenosis L4-5 per MRI at [facility] 6/14... ~  Notes and Records fro [facility]- DrRamos, DrBrooks> reviewed, they plan surgery if cleared fro CV standpoint...  Hx of ANEMIA OF CHRONIC DISEASE (ICD-285.29) - eval by DrGranfortuna w/ ACD secondary to his renal insuffic and Rx w/ aranesp periodically... nothing else found on his extensive eval... he was taking Ferrous Gluconate daily (it no longer makes him tired)... ~  labs 2/09 w/ Hg= 11.2.Marland Kitchen. ~  labs 6/09 w/ Hg= 11.0.Marland Kitchen. rec> OTC Fe prep but he never got it! ~  labs 9/09 showed Hg= 10.5, Fe= 81 ~  labs 4/10 showed Hg= 10.5, MCV= 92, Fe= 77 (22%sat), B12= 590, Folate>20... rec> OTC Fe daily. ~  labs 10/10 showed Hg= 10.3, Fe= 88 ~  labs 4/11 showed Hg= 10.9,  Fe= 58... continue Ferrous Gluc daily w/ VitC ~  labs 11/11 showed Hg= 11.6 ~  6/12:  Again he indicated that the [facility] is following his labs, aske to bring copies for [place] to review. ~  6/14: on FeSO4 325mg /d; Labs 2/14 w/ Hg= 10-11 range; Labs now show Hg= 11.3 ~  Labs 12/14 showed Hg= 10.0 ~  7/15: He has a chronic anemia & f/u labs today showed Hg=10.4 (stable) w/ MCV=91, Fe=84 (27%), Ferritin 104; he continues on MVI, Fe, etc. ~  1/16: on FeSO4 325mg /d; Labs 1/16 w/ Hg= 10.1 stable. ~  7/16: on Fe daily> Labs showed Hg=10, Fe=57 (20%sat), Ferritin=109, continue same...   Past Surgical History  Procedure Laterality Date  . Cataract surgery    . Coronary artery bypass graft  2000    by Dr. Cyndia Bent  . Tonsillectomy    . Colonoscopy      Hx: of  . Lumbar laminectomy/decompression microdiscectomy N/A 09/10/2012    Procedure: LUMBAR DECOMPRESSION,  IN SITU FUSION LUMBAR 4-5;  Surgeon: Melina Schools, MD;  Location: [facility];  Service:  Orthopedics;  Laterality: N/A;    Outpatient Encounter Prescriptions as of 03/23/2015  Medication Sig  . amLODipine (NORVASC) 10 MG tablet Take 5 mg by mouth daily.   Marland Kitchen aspirin EC 81 MG tablet Take 81 mg by mouth every morning.   Marland Kitchen  atorvastatin (LIPITOR) 20 MG tablet Take 10 mg by mouth every evening.   . Calcium Carbonate-Vitamin D (CALCIUM 600/VITAMIN D PO) Take 1 tablet by mouth 2 (two) times daily.   . ferrous gluconate (FERGON) 325 MG tablet Take 325 mg by mouth daily with breakfast.   . furosemide (LASIX) 40 MG tablet Take 1 tablet (40 mg total) by mouth daily.  Marland Kitchen glucose blood (ACCU-CHEK AVIVA PLUS) test strip CHECK BLOOD SUGAR TWICE A DAY.  Marland Kitchen Lancets (ACCU-CHEK MULTICLIX) lancets USE TWICE DAILY.  Marland Kitchen levothyroxine (SYNTHROID) 50 MCG tablet Take 1 tablet (50 mcg total) by mouth daily before breakfast.  . losartan (COZAAR) 100 MG tablet Take 100 mg by mouth daily.  . metoprolol tartrate (LOPRESSOR) 25 MG tablet Take 1 tablet (25 mg total) by mouth 2 (two) times daily.  . Multiple Vitamins-Minerals (CENTRUM SILVER PO) Take 1 tablet by mouth every morning.   . Multiple Vitamins-Minerals (PRESERVISION AREDS 2 PO) Take 1 tablet by mouth every evening.  . [DISCONTINUED] ACCU-CHEK AVIVA PLUS test strip CHECK BLOOD SUGAR TWICE A DAY.  . [DISCONTINUED] Lancets (ACCU-CHEK MULTICLIX) lancets USE TWICE DAILY.   No facility-administered encounter medications on file as of 03/23/2015.    No Known Allergies   Review of Systems        See HPI - all other systems neg except as noted... The patient complains of decreased hearing, dyspnea on exertion, and difficulty walking.  The patient denies anorexia, fever, weight loss, weight gain, vision loss, hoarseness, chest pain, syncope, peripheral edema, prolonged cough, headaches, hemoptysis, abdominal pain, melena, hematochezia, severe indigestion/heartburn, hematuria, incontinence, muscle weakness, suspicious skin lesions, transient blindness,  depression, unusual weight change, abnormal bleeding, enlarged lymph nodes, and angioedema.    Objective:   Physical Exam     WD, WN, 82 y/o WM in NAD... GENERAL:  Alert & oriented; pleasant & cooperative... HEENT:  [place]/AT, EOM-wnl, PERRLA, EACs-clear, TMs-wnl, NOSE-clear, THROAT-clear & wnl. NECK:  Supple w/ fairROM; no JVD; normal carotid impulses w/ faint right bruit; no thyromegaly or nodules palpated; no lymphadenopathy. CHEST:  Clear to P & A; without wheezes/ rales/ or rhonchi heard... HEART:  Regular, gr 1/6 SEM without rubs or gallops heard... ABDOMEN:  Soft & nontender; normal bowel sounds; no organomegaly or masses detected. EXT: without deformities, mod arthritic changes; no varicose veins/ venous insuffic/ or edema. he has bilat fem art bruits & minor pressure area betw right 4th & 5th toes... NEURO:  CN's intact;  no focal neuro deficits... DERM:  Lesion on medial side of right small toe, sl tender...  RADIOLOGY DATA:  Reviewed in the EPIC EMR & discussed w/ the patient...  LABORATORY DATA:  Reviewed in the EPIC EMR & discussed w/ the patient...   Assessment & Plan:    HBP>  BP is OK on the current regimen; continue same; he is also followed by the [place]...  ASHD/ CAD>  Followed by [name] for [facility] & the [facility]; continue current meds...  ASPVD- Carotid art dis & PAD>  Decr ABIs & Carotid dis> on ASA 81mg /d; followed by [name] et al...  CHOL>  On Lipitor20 and FLP is at goals...  DM>  On diet alone & labs also followed by there [facility]; labs today showed BS=116, A1c=6.3.Marland KitchenMarland Kitchen  Renal Insuffic>  Creat stable in the 1.85-1.8 range on current meds (better off NSAIDs & HCTZ)...  Anemia>  Hg seems stable in the 10-11 range w/ ACD...   Patient's Medications  New Prescriptions   No medications  on file  Previous Medications   AMLODIPINE (NORVASC) 10 MG TABLET    Take 5 mg by mouth daily.    ASPIRIN EC 81 MG TABLET    Take 81 mg by mouth every morning.    ATORVASTATIN  (LIPITOR) 20 MG TABLET    Take 10 mg by mouth every evening.    CALCIUM CARBONATE-VITAMIN D (CALCIUM 600/VITAMIN D PO)    Take 1 tablet by mouth 2 (two) times daily.    FERROUS GLUCONATE (FERGON) 325 MG TABLET    Take 325 mg by mouth daily with breakfast.    FUROSEMIDE (LASIX) 40 MG TABLET    Take 1 tablet (40 mg total) by mouth daily.   LEVOTHYROXINE (SYNTHROID) 50 MCG TABLET    Take 1 tablet (50 mcg total) by mouth daily before breakfast.   LOSARTAN (COZAAR) 100 MG TABLET    Take 100 mg by mouth daily.   METOPROLOL TARTRATE (LOPRESSOR) 25 MG TABLET    Take 1 tablet (25 mg total) by mouth 2 (two) times daily.   MULTIPLE VITAMINS-MINERALS (CENTRUM SILVER PO)    Take 1 tablet by mouth every morning.    MULTIPLE VITAMINS-MINERALS (PRESERVISION AREDS 2 PO)    Take 1 tablet by mouth every evening.  Modified Medications   Modified Medication Previous Medication   GLUCOSE BLOOD (ACCU-CHEK AVIVA PLUS) TEST STRIP ACCU-CHEK AVIVA PLUS test strip      CHECK BLOOD SUGAR TWICE A DAY.    CHECK BLOOD SUGAR TWICE A DAY.   LANCETS (ACCU-CHEK MULTICLIX) LANCETS Lancets (ACCU-CHEK MULTICLIX) lancets      USE TWICE DAILY.    USE TWICE DAILY.  Discontinued Medications   No medications on file

## 2015-03-23 NOTE — Patient Instructions (Signed)
Today we updated your med list in our EPIC system...    Continue your current medications the same...  Today we did your follow up CXR & FASTING blood work...    We will contact you w/ the results when available...   Keep up the good work w/ diet 7 exercise but DO NOT go out on the ice!!!  Call for any questions...  Let's plan a follow up visit in 55mo, sooner if needed for problems.Marland KitchenMarland Kitchen

## 2015-03-29 ENCOUNTER — Other Ambulatory Visit: Payer: Self-pay | Admitting: *Deleted

## 2015-03-29 MED ORDER — ACCU-CHEK MULTICLIX LANCETS MISC: Status: AC

## 2015-03-29 MED ORDER — GLUCOSE BLOOD VI STRP
ORAL_STRIP | Status: DC
Start: 2015-03-29 — End: 2018-08-13

## 2015-04-01 LAB — PROTEIN,TOTAL AND ELECTROPHOR W/IFE
Albumin ELP: 4.1 g/dL (ref 3.8–4.8)
Alpha-1-Globulin: 0.3 g/dL (ref 0.2–0.3)
Alpha-2-Globulin: 0.8 g/dL (ref 0.5–0.9)
BETA 2: 0.4 g/dL (ref 0.2–0.5)
BETA GLOBULIN: 0.5 g/dL (ref 0.4–0.6)
Gamma Globulin: 1 g/dL (ref 0.8–1.7)
TOTAL PROTEIN, SERUM ELECTROPHOR: 7 g/dL (ref 6.1–8.1)

## 2015-04-01 LAB — IMMUNOFIXATION ELECTROPHORESIS
IGG (IMMUNOGLOBIN G), SERUM: 1020 mg/dL (ref 650–1600)
IgA: 471 mg/dL — ABNORMAL HIGH (ref 68–379)
IgM, Serum: 96 mg/dL (ref 41–251)

## 2015-04-07 ENCOUNTER — Telehealth: Payer: Self-pay | Admitting: Pulmonary Disease

## 2015-04-07 NOTE — Telephone Encounter (Signed)
Spoke with pt. He would like to have his labs from 03/24/15 mailed to him. These has been placed in the outgoing mail. Nothing further was needed.

## 2015-05-29 ENCOUNTER — Other Ambulatory Visit: Payer: Self-pay | Admitting: Pulmonary Disease

## 2015-08-16 ENCOUNTER — Other Ambulatory Visit (INDEPENDENT_AMBULATORY_CARE_PROVIDER_SITE_OTHER): Payer: Medicare Other

## 2015-08-16 ENCOUNTER — Other Ambulatory Visit: Payer: Self-pay | Admitting: Pulmonary Disease

## 2015-08-16 ENCOUNTER — Ambulatory Visit (INDEPENDENT_AMBULATORY_CARE_PROVIDER_SITE_OTHER): Payer: Medicare Other | Admitting: Pulmonary Disease

## 2015-08-16 ENCOUNTER — Encounter: Payer: Self-pay | Admitting: Pulmonary Disease

## 2015-08-16 VITALS — BP 130/64 | HR 62 | Ht 68.0 in | Wt 164.8 lb

## 2015-08-16 DIAGNOSIS — E7800 Pure hypercholesterolemia, unspecified: Secondary | ICD-10-CM

## 2015-08-16 DIAGNOSIS — I25810 Atherosclerosis of coronary artery bypass graft(s) without angina pectoris: Secondary | ICD-10-CM | POA: Diagnosis not present

## 2015-08-16 DIAGNOSIS — I739 Peripheral vascular disease, unspecified: Secondary | ICD-10-CM | POA: Diagnosis not present

## 2015-08-16 DIAGNOSIS — M159 Polyosteoarthritis, unspecified: Secondary | ICD-10-CM

## 2015-08-16 DIAGNOSIS — I6523 Occlusion and stenosis of bilateral carotid arteries: Secondary | ICD-10-CM

## 2015-08-16 DIAGNOSIS — E119 Type 2 diabetes mellitus without complications: Secondary | ICD-10-CM

## 2015-08-16 DIAGNOSIS — I70219 Atherosclerosis of native arteries of extremities with intermittent claudication, unspecified extremity: Secondary | ICD-10-CM

## 2015-08-16 DIAGNOSIS — I10 Essential (primary) hypertension: Secondary | ICD-10-CM

## 2015-08-16 DIAGNOSIS — D638 Anemia in other chronic diseases classified elsewhere: Secondary | ICD-10-CM

## 2015-08-16 DIAGNOSIS — G319 Degenerative disease of nervous system, unspecified: Secondary | ICD-10-CM

## 2015-08-16 DIAGNOSIS — E039 Hypothyroidism, unspecified: Secondary | ICD-10-CM

## 2015-08-16 DIAGNOSIS — M150 Primary generalized (osteo)arthritis: Secondary | ICD-10-CM

## 2015-08-16 DIAGNOSIS — N183 Chronic kidney disease, stage 3 unspecified: Secondary | ICD-10-CM

## 2015-08-16 LAB — BASIC METABOLIC PANEL
BUN: 37 mg/dL — ABNORMAL HIGH (ref 6–23)
CHLORIDE: 104 meq/L (ref 96–112)
CO2: 28 meq/L (ref 19–32)
Calcium: 9.6 mg/dL (ref 8.4–10.5)
Creatinine, Ser: 2.17 mg/dL — ABNORMAL HIGH (ref 0.40–1.50)
GFR: 30.57 mL/min — ABNORMAL LOW (ref >60.00)
Glucose, Bld: 107 mg/dL — ABNORMAL HIGH (ref 70–99)
Potassium: 5.3 mEq/L — ABNORMAL HIGH (ref 3.5–5.1)
SODIUM: 137 meq/L (ref 135–145)

## 2015-08-16 LAB — CBC WITH DIFFERENTIAL/PLATELET
BASOS ABS: 0 10*3/uL (ref 0.0–0.1)
Basophils Relative: 0.3 % (ref 0.0–3.0)
EOS ABS: 0.3 10*3/uL (ref 0.0–0.7)
EOS PCT: 3.6 % (ref 0.0–5.0)
HCT: 30.3 % — ABNORMAL LOW (ref 39.0–52.0)
Hemoglobin: 10.4 g/dL — ABNORMAL LOW (ref 13.0–17.0)
LYMPHS PCT: 21.9 % (ref 12.0–46.0)
Lymphs Abs: 1.6 10*3/uL (ref 0.7–4.0)
MCHC: 34.4 g/dL (ref 30.0–36.0)
MCV: 88 fl (ref 78.0–100.0)
MONO ABS: 1.3 10*3/uL — AB (ref 0.1–1.0)
MONOS PCT: 17.9 % — AB (ref 3.0–12.0)
NEUTROS PCT: 56.3 % (ref 43.0–77.0)
Neutro Abs: 4 10*3/uL (ref 1.4–7.7)
Platelets: 205 10*3/uL (ref 150.0–400.0)
RBC: 3.44 Mil/uL — AB (ref 4.22–5.81)
RDW: 13.2 % (ref 11.5–15.5)
WBC: 7.1 10*3/uL (ref 4.0–10.5)

## 2015-08-16 LAB — HEMOGLOBIN A1C: Hgb A1c MFr Bld: 6.3 % (ref 4.6–6.5)

## 2015-08-16 NOTE — Progress Notes (Addendum)
Subjective:    Patient ID: Henry [name], male    DOB: 01-Jul-1924, 82 y.o.   MRN: [id number]  HPI 82 y/o WM here for a follow up visit... he has mult med problems as listed and is followed by DrBBrodie and DrPerry as well... he gets his meds filled at the [facility] which makes for a complicated array of perscribed meds and substituted alternatives... ~  SEE PREV EPIC NOTES FOR OLDER DATA >>     CXR 6/14 showed normal heart size, tortuous Ao, mild COPD changes w/ clear lungs/ NAD, DJD in Tspine...   LABS 6/14:  Chems- ok x BS=132 A1c=6.6 Creat=1.7;  CBC- Hg=11.3;  TSH=5.84;  B12=530;  BNP=198...  LABS 12/14:  FLP- at goals on Lip20;  Chems- ok x BS=131, A1c=6.9, Cr=1.8;  CBC= Hg= 10.0;  TSH=8.74 & rec to start Synthroid50...  LABS 7/15:  Chems- ok x BS=129, A1c=6.5, BUN=25, Cr=1.8;  CBC- ok x Hg=10.4, Fe=84 (27%sat), Ferritin=104;  TSH=3.50...  Art Dopplers 12/15 showed ABIs .79R & .71L, TBIs were abn but w/o risk of tissue loss   ~  March 01, 2014:  60mo ROV & Gaspare has had recent f/u w/ [name] with adjustment in meds- [place]; he still has some edema & not taking his Lasix20 regularly;  We reviewed the following medical problems during today's office visit >>     HBP> on Metop25Bid, Amlod5, Losar100, Lasix20 (prn);  BP= 124/68 & he has persistent edema; denies CP, palpit, ch in SOB, etc...    ASHD> on ASA81; known CAD & s/p CABG 2000; followed by [name] & last seen 12/15- norm Myoview 7/14; he denies CP/ angina, palpit, SOB; needs to take Lasix20 daily for the edema.    Carotid Art Dis> on ASA81; he denies cerebral ischemic symptoms; CT/ MRI Brain 2/14 w/ atrophy & sm vessel dis; last CDoppler 1/13 showed mild heterogeneous plaque bilat w/ 40-59% bilat ICAstenoses & f/u is overdue...     Periph Vasc Dis> on ASA81; he has stable claud w/ ambulation; last Art Dopplers 12/15 showed ABIs .79R & .71L, TBIs were abn but w/o risk of tissue loss; overdue for yearly f/u  studies...    CHOL> on Lip20 (off prev Crestor); Labs 1/16 showed TChol 164, TG 151, HDL 36, LDL 98... Needs better diet.    DM> on diet alone (off prev Metform); Labs 12/14 showed BS= 131, A1c=6.9; Labs 1/16 showed BS= 127; we reviewed diet, exercise, etc....    Renal Insuffic> he knows to avoid NSAIDs; Labs 12/14 w/ Creat= 1.8 and Labs 1/16 showed Cr= 1.5    DJD, LBP w/ spinal stenosis> on Tramadol50 prn; followed by DrRamos=> DrBrooks at [facility]; they did MRI w/ report of sp stenosis severe at L4-5; surg 7/14 w/ lumbar decompression & fusion L4-5...    Anemia> on FeSO4 325mg /d; Labs 1/16 w/ Hg= 10.1 stable... We reviewed prob list, meds, xrays and labs> see below for updates >> he will get flu shot at [facility].  CXR 1/16 showed norm heart size, prior CABG, COPD w/ scarring at the bases, NAD...  LABS 1/16:  FLP- ok on Atorva20;  Chems- ok w/ stable Cr=1.5, BS=127;  CBC- mild chr anemia w/ Hg=10.1;  B12=408; TSH=9.10 off meds...  ~  August 31, 2014:  43mo ROV & Henry [name] has had a good interval- his CC's are "tightness in my legs" esp w/ walking & he has known ASPVD w/ f/u [name] soon, and feeling sl better on the  Synthroid50 regularly... We reviewed the following medical problems during today's office visit >>     HBP> on Metop25Bid, Amlod5, Losar100, Lasix20 (prn);  BP= 114/56 & he has persistent mild edema; denies CP, palpit, ch in SOB, etc...    ASHD> on ASA81; known CAD & s/p CABG 2000; followed by [name] & last seen 12/15- norm Myoview 7/14; he denies CP/ angina, palpit, SOB; needs to take Lasix20 daily for the edema.    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    Periph Vasc Dis> on ASA81; he has stable claud w/ ambulation; last Art Dopplers 12/15 showed ABIs .79R & .71L, TBIs were abn but w/o risk of tissue  loss; overdue for yearly f/u studies...    CHOL> on Lip20 (off prev Crestor); Labs 1/16 showed TChol 164, TG 151, HDL 36, LDL 98... Needs better diet.    DM> on diet alone (off prev Metform); Labs 1/16 showed BS= 127; Labs 7/16 showed BS=116, A1c=6.3; we reviewed diet, exercise, etc....    Renal Insuffic> he knows to avoid NSAIDs; Labs 12/14 w/ Creat= 1.8 and Labs 1/16 showed Cr= 1.5; Labs 7/16 w/ Cr=2.0 & rec to incr water intake...    DJD, LBP w/ spinal stenosis> on Tramadol50 prn; followed by DrRamos=> DrBrooks at [facility]; they did MRI w/ report of sp stenosis severe at L4-5; surg 7/14 w/ lumbar decompression & fusion L4-5...    Anemia> on FeSO4 325mg /d; Labs 1/16 w/ Hg= 10.1 stable; Labs 7/16 showed Hg=10, Fe=57 (20%sat), Ferritin=109; continue same... We reviewed prob list, meds, xrays and labs> see below for updates >>   LABS 7/16:  Chems- ok w/ BS=116, A1c=6.3, BUN=28, Cr=2.0;  CBC- anemic w/ Hg=10.0, Fe=57 (20%), Ferritin=109;  TSH=3.62... PLAN>>  Continue same meds, continue f/u [name], ROV w/ me in 25mo...  ~  March 23, 2015:  38mo ROV & Henry [name] returns for routine ROV- he tells me he recently fell on the ice & hurt left leg, ortho eval was neg for any fxs; otherw feeling well & he denies CP, palpit, SOB, edema; he denies cough, sput, hemoptysis, etc;  hw had Cards f/u w/ [name] 12/2014> f/u HBP, CAD, remote CABG, HL, ASPVD w/ claudication, CRI w/ Cr~2> BNP reported elev [facility] & he was given more Lasix (40mg /d); f/u 2DEcho 02/08/15 showed mild LVH, norm LVF w/ EF=55-60%, no regional wall motion abn, Gr1DD, mod AoV calcif & mild AS/ trivAI, mild MR, mild LAdil, norm RVF & PAsys=29...    EXAM shows Afeb, VSS, O2sat=98% on RA;  HEENT- neg, mallampati2;  Chest- bibasilar crackles ~1/3rd way up w/o w/r;  Heart- RR gr1/6 SEM at apex & base, w/o r/g;  Abd- soft, neg;  Ext- left ankle swelling  EKG 12/2014 showed NSR, rate 63/min, Q in III, NSSTTWA, NAD...  2DEcho 02/08/15 showed mild LVH,  norm LVF w/ EF=55-60%, no regional wall motion abn, Gr1DD, mod AoV calcif & mild AS/ trivAI, mild MR, mild LAdil, norm RVF & PAsys=29  CDopplers 03/22/15>  bilat heterogen plaque, 40-59% bilat ICAstenoses- stable; norm subclav & vertebrals...   CXR 03/23/15>  Heart at upper lim of norm & s/p CABG; underlying COPD w/ flatening of diaph & chr incr markings at bases; mild DDD in Tspine; NAD- s/p CABG & chronic changes...  LABS 03/23/15>  FLP- all parameters at  goals on Lip10;  Chems- ok x BS=105, A1c=6.6, BUN=29, Cr=1.94, LFTs=wnl;  CBC- stable anemia w/ Hg=10.2, MCV=91; Fe=93 (30%sat), Ferritin=187, SPE/IEP- neg, no monoclonal prot... IMP/PLAN>>  Hemal is stable on his current regimen- continue same; reminded to be careful during the winter months; we plan ROV recheck in 86months...  ~  August 16, 2015:  4mo ROV & [place] is c/o increased fatigue & bilat leg tightness w/ exertion (we discussed checking Art dopplers and ABIs); he also notes that his balance is off but no falls (we discussed a Neuro eval & poss PT rx but he declines and says he'll think about it);  We reviewed the following medical problems during today's office visit >>     HBP> on Metop25Bid, Amlod5, Losar100, Lasix40;  BP= 130/64 & he has persistent mild edema; denies CP, palpit, ch in SOB, etc...    ASHD> on ASA81; known CAD & s/p CABG 2000; followed by [name] & last seen 01/24/15- norm Myoview 7/14; he denies CP/ angina, palpit, SOB; needs to take Lasix20 daily for the edema.    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    Periph Vasc Dis> on ASA81; he has stable claud w/ ambulation; last Art Dopplers 12/15 showed ABIs .79R & .71L, TBIs were abn but w/o risk of tissue loss; now c/o increased leg symptoms- ArtDopplers pending...    CHOL> on Lip20; Labs 1/16  showed TChol 164, TG 151, HDL 36, LDL 98... Needs better diet.    DM> on diet alone (off prev Metform); Labs 1/16 showed BS= 127; Labs 7/16 showed BS=116, A1c=6.3; we reviewed diet, exercise, etc....    Hypothyroid> on Synthroid50    Renal Insuffic> he knows to avoid NSAIDs; Labs 12/14 w/ Creat= 1.8 and Labs 1/16 showed Cr= 1.5; Labs 7/16 w/ Cr=2.0 & rec to incr water intake...    DJD, LBP w/ spinal stenosis> on Tramadol50 prn; followed by DrRamos=> DrBrooks at [facility]; they did MRI w/ report of sp stenosis severe at L4-5; surg 7/14 w/ lumbar decompression & fusion L4-5...    Anemia> on FeSO4 325mg /d; Labs 1/16 w/ Hg= 10.1 stable; Labs 7/16 showed Hg=10, Fe=57 (20%sat), Ferritin=109; continue same... EXAM shows Afeb, VSS, O2sat=100% on RA;  HEENT- neg, mallampati2;  Chest- bibasilar crackles ~1/3rd way up w/o w/r;  Heart- RR gr1/6 SEM at apex & base, w/o r/g;  Abd- soft, neg;  Ext- left ankle swelling  LABS 08/16/15>  Chems- ok w/ K=5.3, BS=107, A1c=6.3, BUN=37, Cr=2.17;  CBC- Hg=10.4, otherw ok...   ArtDopplers LEs 08/24/15>  Known bilat SFA disease- R-ABI=0.83 (mild), L-ABI=0.79 (mod); TBIs are R-0.65 & L-0.52...  IMP/PLAN>>  Milos is 82 y/o w/ multisys dis, known ASHD & PVD, continue same meds and try to incr exercise; we discussed low sodium, elev legs, support hose, continue Lasix40; he will maintain f/u w/ Cards- [name]...          Problem List:  HYPERTENSION (ICD-401.9) >>  ~  controlled on 4 meds:  METOPROLOL 25mg Bid, AMLODIPINE 10mg /d, LOSARTAN 25mg /d, and HCT 25mg - 1/2 qd...  ~  [place] showed mild LVH, AoV sclerosis & mild AI, EF= 60%... ~  labs 11/11 showed BUN=36, Creat=2.1, K=5.1.Marland KitchenMarland Kitchen rec to stop HCTZ... ~  [facility] subseq re-started 12.5mg  HCTZ in the interim due to edema... ~  6/14:  on Metop25/d,  Amlod10, Losar25, Lasix20;  BP= 160/64 but he is in pain & ?took his meds today; he denies CP, palpit, ch in SOB, edema, etc; asked to incr Metop25Bid.  ~  CXR 6/14 showed norm  heart size, atherosclerotic & tort Ao, emphysematous changes in upper lobes & scarring in lower lobes, DJD in spine, NAD... ~  12/14: on Metop25Bid, Amlod10, Losar25-3/d, Lasix20;  BP= 144/60 & he denies CP, palpit, ch in SOB, edema, etc. ~  September 19, 2022: BP controlled on Metop25Bid, Amlod10, Losar25-3/d, Lasix20;  BP= 120/74 7 he denies CP, palpit, dyspnea, etc; Labs showed K=4.7, BUN=25, Cr=1.8 stable. ~  1/16: on Metop25Bid, Amlod5, Losar100, Lasix20 (prn);  BP= 124/68 & he has persistent edema; denies CP, palpit, ch in SOB, etc. ~  7/16: on Metop25Bid, Amlod5, Losar100, Lasix20 (prn);  BP= 114/56 & he remains stable...  ARTERIOSCLEROTIC HEART DISEASE (ICD-414.00) - on ASA 81mg /d... long time pt of DrJoeLeBauer & followed by DrBrodie now... s/p CABG in 2000 by DrBartle (for L main dis)... he is active & w/o angina, palpit, change in SOB/ DOE, etc... ~  NuclearStressTest 9/08 was normal without ischemia and EF=68%... ~  12/13: he saw [name] for f/u CAD, s/p CABG, PAD, Carotid Art dis> no angina, bilat calf pain w/ ambulation but felt to be stable; [facility], but [facility]... ~  EKG 6/14 showed SBrady, rate56, 1st degree AVB, otherw wnl... ~  7/14:  Myoview showed normal wall motion & EF=61%, no CP or EKG changes... ~  09/19/22:  He has ASHD & ASPVD on ASA81; as noted he is active & denies angina etc; he saw [name] 12/14 & stable, continue medical therapy & exercise. ~  1/16: on ASA81; known CAD & s/p CABG 2000; followed by [name] & last seen 12/15- norm Myoview 7/14; he denies CP/ angina, palpit, SOB; needs to take Lasix20 daily for the edema.  CAROTID ARTERY DISEASE>  PERIPHERAL VASCULAR DISEASE (ICD-443.9) - on ASA daily and he has bilat femoral bruits... he has seen DrDowney in the past (now [name]) w/ exerc program recommended... he walks regularly w/ stable claud symptoms, no sores or lesions on LE's... ~  ABI's 10/08 were normal bilat = 1.0.Marland KitchenMarland Kitchen waveforms were biphasic  however (no change from 2005). ~  CDoppler's 10/08 showed mild bilat carotid dis w/ 40-59% RICA stenosis, and [id number] LICA stenosis. ~  CDoppler's 10/09 showed the same= stable. ~  CDoppler's 11/10 showed stable mild carotid dis bilat> [id number] RICA & [id number] LICA stenoses. ~  ABI's 11/10 showed prob mod severe >50% distal right SFA stenosis, & mod dis in left SFA, ABI's=0.9 ~  Podiarty sent him to [facility] for ArtDopplers 9/11> ABIs 0.82 R & 0.74L w/ bilat SFA dis L>R, toe pressures adeq for healing... ~  6/14: on ASA81; he denies cerebral ischemic symptoms; CT/ MRI Brain 2/14 showed sm vessel dis, vasc calcif, global atrophy (no hemorrhage or large area infarct); last CDoppler 1/13 showed mild heterogeneous plaque bilat w/ 40-59% bilat ICAstenoses... ~  6/14: on ASA81; he has stable claud w/ ambulation but lim by back pain now; last Art Dopplers 1/13 showed ABIs .92R & .80L, TBIs were abn but w/o risk of tissue loss; overdue for yearly f/u studies of Carotids & ABIs... ~  2022/09/19: He has ASHD & ASPVD on ASA81; as noted he is active & denies angina etc; he saw [name] 12/14 & stable, continue medical therapy & exercise. ~  CDoppler 1/16 showed mild heterogeneous plaque bilat w/ 40-59% bilat  ICAstenoses & norm subclav/ antegrade verts.  HYPERCHOLESTEROLEMIA (ICD-272.0) - prev controlled on Simva40 + diet, [facility] CRESTOR 10mg /d. ~  [place] 2/09 on Simva40 showed Tchol 169, TG 206, HDL 37, LDL 96 ~  FLP 4/10 on Simva40 showed TChol 146, TG 145, HDL 36, LDL 82 ~  FLP 10/10 on Simva40 showed TChol 166, TG 130, HDL 38, LDL 102 ~  FLP 4/11 on Simva40 showed TChol 138, TG 87, HDL 40, LDL 80... [facility] subseq ch to Cres10 ~  FLP 11/11 on Cres10 showed TChol 147, TG 161, HDL 34, LDL 81 ~  6/12:  He indicates interval labs done by the [facility] but he didn't bring copies for [place] to review... ~  6/14: on Lip20 (off prev Crestor); Labs done at [facility] but he didn't bring reports; he is not fasting today for recheck.   ~  [place] 12/14 on Lip20 showed TChol 127, TG 56, HDL 41, LDL 75  ~  FLP 1/16 on Lip20 showed TChol 164, TG 151, HDL 36, LDL 98  DIABETES MELLITUS (ICD-250.00) - he's struggled w/ diet Rx... wt stable ~175 range... prev on Metformin 500Bid but [facility] changed to GLYBURIDE 5mg /d, then decr to 1/2 tab daily due to side effects & he stopped due to hypogly. ~  labs 10/08 showed FBS= 134, and HgA1c= 5.4... ~  labs 6/09 showed BS= 101, HgA1c= 6.4.Marland KitchenMarland Kitchen monofilament test is normal. ~  [facility] eval 1/10 at [facility] showed no DM retinopathy... ~  labs 4/10 on MetformBid showed BS= 106, A1c= 6.8, Urine Microalb= sl incr @ 7.8mg % ~  labs 10/10 showed BS= 119, A1c= 6.5...  NOTE: 2/11 [facility] ch to Glybur5mg - 1/2 daily. ~  labs 4/11 on Glyb2.5 showed BS= 97, A1c= 6.6.Marland KitchenMarland Kitchen pt subseq stopped Glybur due to low sugars. ~  labs 11/11 off meds showed BS= 149, A1c= 6.7.Marland KitchenMarland Kitchen coninue diet Rx. ~  6/12:  He indicates interim labs done by the [facility] but he didn't bring copies for [place] to review... ~  6/14: on diet alone (off prev Metform); Labs 2/14 Hosp w/ BS= 150-200+ range; now BS=132, A1c=6.6; he follows low carb diet ~  Labs 12/14 on diet alone (weight up 13# to 173#) showed BS= 131, A1c= 6.9 ~  7/15: He has DM controlled on diet alone;weight stable at 171#, BMI=25-6; Labs today showed BS=129, A1c=6.5; continue diet management. ~  1/16: on diet alone (off prev Metform); Labs 12/14 showed BS= 131, A1c=6.9; Labs 1/16 showed BS= 127; we reviewed diet, exercise, etc ~  7/16:  On diet alone> Labs 7/16 showed BS=116, A1c=6.3; we reviewed diet, exercise, etc...  BORDERLINE TSH >> ~  Serial labs showed rising TSH> 3.57 => 5.84 => 8.74 (12/14) and we decided to start Synthroid66mcg/d;  [facility]how the [facility] told [facility] to [facility] this med & he did not call to check w/ [place]... ~  7/15  Follow up TSH= 3.50 not on meds, clinically euthyroid, good energy, etc;  we will follow closely...  ~  1/16:  Labs here 1/16 showed TSH= 9.10 & he is requested to restart the  Levothy57mcg/d... ~  7/16: now on Levothy50 every day> Labs 7/16 showed TSH= 3.62, continue same.  RENAL INSUFFICIENCY (ICD-588.9) - Creat 1.8-2.0 in the past... ~  labs 6/09 showed BUN= 31, Creat= 1.6... ~  labs 9/09 showed BUN= 25, Creat= 1.6, K= 4.5 ~  labs 4/10 showed BUN= 26, Creat= 1.6, K= 5.0 ~  labs 10/10 showed BUN= 21, Creat= 1.6 ~  labs 4/11 showed BUN= 30, Creat= 1.9..Marland Kitchen advised to stay well hydrated. ~  labs 11/11 showed BUN= 36, Creat= 2.1.Marland KitchenMarland Kitchen rec to stop the HCTZ ~  6/12:  He indicates that the [facility] restarted HCTZ 12.5mg /d & they are following labs... ~  6/14: he knows to avoid NSAIDs; Labs 2/14 w/ Creat= 1.4-1.6 range; Labs today showed BUN= 21, Creat= 1.7... ~  Labs 12/14 showed BUN= 23, Creat= 1.8 ~  Labs 7/15 showed BUN= 25, Cr= 1.8 ~  Labs 1/16 showed BUN= 25, Cr= 1.5 ~  Labs 7/16 showed BUN= 28, Cr= 2.0  DEGENERATIVE JOINT DISEASE (ICD-715.90) LOW BACK PAIN >> he has severe sp stenosis L4-5 per MRI at [facility] 6/14... ~  Notes and Records fro [facility]- DrRamos, DrBrooks> reviewed, they plan surgery if cleared fro CV standpoint...  Hx of ANEMIA OF CHRONIC DISEASE (ICD-285.29) - eval by DrGranfortuna w/ ACD secondary to his renal insuffic and Rx w/ aranesp periodically... nothing else found on his extensive eval... he was taking Ferrous Gluconate daily (it no longer makes him tired)... ~  labs 2/09 w/ Hg= 11.2.Marland Kitchen. ~  labs 6/09 w/ Hg= 11.0.Marland Kitchen. rec> OTC Fe prep but he never got it! ~  labs 9/09 showed Hg= 10.5, Fe= 81 ~  labs 4/10 showed Hg= 10.5, MCV= 92, Fe= 77 (22%sat), B12= 590, Folate>20... rec> OTC Fe daily. ~  labs 10/10 showed Hg= 10.3, Fe= 88 ~  labs 4/11 showed Hg= 10.9,  Fe= 58... continue Ferrous Gluc daily w/ VitC ~  labs 11/11 showed Hg= 11.6 ~  6/12:  Again he indicated that the [facility] is following his labs, aske to bring copies for [place] to review. ~  6/14: on FeSO4 325mg /d; Labs 2/14 w/ Hg= 10-11 range; Labs now show Hg= 11.3 ~  Labs 12/14  showed Hg= 10.0 ~  7/15: He has a chronic anemia & f/u labs today showed Hg=10.4 (stable) w/ MCV=91, Fe=84 (27%), Ferritin 104; he continues on MVI, Fe, etc. ~  1/16: on FeSO4 325mg /d; Labs 1/16 w/ Hg= 10.1 stable. ~  7/16: on Fe daily> Labs showed Hg=10, Fe=57 (20%sat), Ferritin=109, continue same...   Past Surgical History  Procedure Laterality Date  . Cataract surgery    . Coronary artery bypass graft  2000    by Dr. Cyndia Bent  . Tonsillectomy    . Colonoscopy      Hx: of  . Lumbar laminectomy/decompression microdiscectomy N/A 09/10/2012    Procedure: LUMBAR DECOMPRESSION,  IN SITU FUSION LUMBAR 4-5;  Surgeon: Melina Schools, MD;  Location: [place];  Service: Orthopedics;  Laterality: N/A;    Outpatient Encounter Prescriptions as of 08/16/2015  Medication Sig  . amLODipine (NORVASC) 10 MG tablet Take 5 mg by mouth daily.   Marland Kitchen aspirin EC 81 MG tablet Take 81 mg by mouth every morning.   Marland Kitchen atorvastatin (LIPITOR) 20 MG tablet Take 10 mg by mouth every evening.   . Calcium Carbonate-Vitamin D (CALCIUM 600/VITAMIN D PO) Take 1 tablet by mouth 2 (two) times daily.   . ferrous gluconate (FERGON) 325 MG tablet Take 325 mg by mouth daily with breakfast.   . furosemide (LASIX) 40 MG tablet Take 1 tablet (40 mg total) by mouth daily.  Marland Kitchen glucose blood (ACCU-CHEK AVIVA PLUS) test strip CHECK BLOOD SUGAR TWICE A DAY.  Marland Kitchen Lancets (ACCU-CHEK MULTICLIX) lancets USE TWICE DAILY.  Marland Kitchen levothyroxine (SYNTHROID, LEVOTHROID) 50 MCG tablet TAKE ONE TABLET BY MOUTH ONCE DAILY BEFORE BREAKFAST  . losartan (COZAAR) 100  MG tablet Take 100 mg by mouth daily.  . metoprolol tartrate (LOPRESSOR) 25 MG tablet Take 1 tablet (25 mg total) by mouth 2 (two) times daily.  . Multiple Vitamins-Minerals (CENTRUM SILVER PO) Take 1 tablet by mouth every morning.   . Multiple Vitamins-Minerals (PRESERVISION AREDS 2 PO) Take 1 tablet by mouth every evening.   No facility-administered encounter medications on file as of 08/16/2015.     No Known Allergies   Review of Systems        See HPI - all other systems neg except as noted... The patient complains of decreased hearing, dyspnea on exertion, and difficulty walking.  The patient denies anorexia, fever, weight loss, weight gain, vision loss, hoarseness, chest pain, syncope, peripheral edema, prolonged cough, headaches, hemoptysis, abdominal pain, melena, hematochezia, severe indigestion/heartburn, hematuria, incontinence, muscle weakness, suspicious skin lesions, transient blindness, depression, unusual weight change, abnormal bleeding, enlarged lymph nodes, and angioedema.    Objective:   Physical Exam     WD, WN, 82 y/o WM in NAD... GENERAL:  Alert & oriented; pleasant & cooperative... HEENT:  [place]/AT, EOM-wnl, PERRLA, EACs-clear, TMs-wnl, NOSE-clear, THROAT-clear & wnl. NECK:  Supple w/ fairROM; no JVD; normal carotid impulses w/ faint right bruit; no thyromegaly or nodules palpated; no lymphadenopathy. CHEST:  Clear to P & A; without wheezes/ rales/ or rhonchi heard... HEART:  Regular, gr 1/6 SEM without rubs or gallops heard... ABDOMEN:  Soft & nontender; normal bowel sounds; no organomegaly or masses detected. EXT: without deformities, mod arthritic changes; no varicose veins/ venous insuffic/ or edema. he has bilat fem art bruits & minor pressure area betw right 4th & 5th toes... NEURO:  CN's intact;  no focal neuro deficits... DERM:  Lesion on medial side of right small toe, sl tender...  RADIOLOGY DATA:  Reviewed in the EPIC EMR & discussed w/ the patient...  LABORATORY DATA:  Reviewed in the EPIC EMR & discussed w/ the patient...   Assessment & Plan:    HBP>  BP is OK on the current regimen; continue same; he is also followed by the [facility]...  ASHD/ CAD>  Followed by [name] for [facility] & the [facility]; continue current meds...  ASPVD- Carotid art dis & PAD>  Decr ABIs & Carotid dis> on ASA 81mg /d; followed by [name] et al...  CHOL>  On  Lipitor20 and FLP is at goals...  DM>  On diet alone & labs also followed by there [facility]; labs today showed BS=116, A1c=6.3.Marland KitchenMarland Kitchen  Renal Insuffic>  Creat stable in the 1.85-1.8 range on current meds (better off NSAIDs & HCTZ)...  Anemia>  Hg seems stable in the 10-11 range w/ ACD...   Patient's Medications  New Prescriptions   No medications on file  Previous Medications   AMLODIPINE (NORVASC) 10 MG TABLET    Take 5 mg by mouth daily.    ASPIRIN EC 81 MG TABLET    Take 81 mg by mouth every morning.    ATORVASTATIN (LIPITOR) 20 MG TABLET    Take 10 mg by mouth every evening.    CALCIUM CARBONATE-VITAMIN D (CALCIUM 600/VITAMIN D PO)    Take 1 tablet by mouth 2 (two) times daily.    FERROUS GLUCONATE (FERGON) 325 MG TABLET    Take 325 mg by mouth daily with breakfast.    FUROSEMIDE (LASIX) 40 MG TABLET    Take 1 tablet (40 mg total) by mouth daily.   GLUCOSE BLOOD (ACCU-CHEK AVIVA PLUS) TEST STRIP    CHECK BLOOD SUGAR TWICE  A DAY.   LANCETS (ACCU-CHEK MULTICLIX) LANCETS    USE TWICE DAILY.   LEVOTHYROXINE (SYNTHROID, LEVOTHROID) 50 MCG TABLET    TAKE ONE TABLET BY MOUTH ONCE DAILY BEFORE BREAKFAST   LOSARTAN (COZAAR) 100 MG TABLET    Take 100 mg by mouth daily.   METOPROLOL TARTRATE (LOPRESSOR) 25 MG TABLET    Take 1 tablet (25 mg total) by mouth 2 (two) times daily.   MULTIPLE VITAMINS-MINERALS (CENTRUM SILVER PO)    Take 1 tablet by mouth every morning.    MULTIPLE VITAMINS-MINERALS (PRESERVISION AREDS 2 PO)    Take 1 tablet by mouth every evening.  Modified Medications   No medications on file  Discontinued Medications   No medications on file

## 2015-08-16 NOTE — Patient Instructions (Signed)
Today we updated your med list in our EPIC system...    Continue your current medications the same...  Today we checked your follow up blood work... We will arrange for ARTERIAL DOPPLERs of your LEGS...    We will contact you w/ the results when available...   In the meanwhile>>  Continue your current meds the same...    Remember>> NO SALT!!!  Elevate your legs.  Wear support hose...  Call for any questions...  Let's plan a follow up visit in 40mo, sooner if needed for problems.Marland KitchenMarland Kitchen

## 2015-08-24 ENCOUNTER — Other Ambulatory Visit: Payer: Self-pay | Admitting: Pulmonary Disease

## 2015-08-24 ENCOUNTER — Ambulatory Visit (HOSPITAL_COMMUNITY)
Admission: RE | Admit: 2015-08-24 | Discharge: 2015-08-24 | Disposition: A | Discharge status: Home / Self Care | Payer: Medicare Other | Source: Ambulatory Visit | Attending: Cardiovascular Disease | Admitting: Cardiovascular Disease

## 2015-08-24 DIAGNOSIS — E119 Type 2 diabetes mellitus without complications: Secondary | ICD-10-CM | POA: Insufficient documentation

## 2015-08-24 DIAGNOSIS — E785 Hyperlipidemia, unspecified: Secondary | ICD-10-CM | POA: Diagnosis not present

## 2015-08-24 DIAGNOSIS — I739 Peripheral vascular disease, unspecified: Secondary | ICD-10-CM | POA: Diagnosis not present

## 2015-08-24 DIAGNOSIS — M79606 Pain in leg, unspecified: Secondary | ICD-10-CM | POA: Diagnosis present

## 2015-08-24 DIAGNOSIS — I10 Essential (primary) hypertension: Secondary | ICD-10-CM | POA: Insufficient documentation

## 2015-08-24 DIAGNOSIS — R938 Abnormal findings on diagnostic imaging of other specified body structures: Secondary | ICD-10-CM | POA: Diagnosis not present

## 2015-09-05 DIAGNOSIS — E119 Type 2 diabetes mellitus without complications: Secondary | ICD-10-CM | POA: Diagnosis not present

## 2015-09-05 DIAGNOSIS — H35363 Drusen (degenerative) of macula, bilateral: Secondary | ICD-10-CM | POA: Diagnosis not present

## 2015-09-05 DIAGNOSIS — H35372 Puckering of macula, left eye: Secondary | ICD-10-CM | POA: Diagnosis not present

## 2015-09-05 DIAGNOSIS — H353132 Nonexudative age-related macular degeneration, bilateral, intermediate dry stage: Secondary | ICD-10-CM | POA: Diagnosis not present

## 2015-09-20 ENCOUNTER — Ambulatory Visit: Payer: Medicare Other | Admitting: Pulmonary Disease

## 2016-01-30 ENCOUNTER — Ambulatory Visit (INDEPENDENT_AMBULATORY_CARE_PROVIDER_SITE_OTHER): Payer: Medicare Other | Admitting: Pulmonary Disease

## 2016-01-30 ENCOUNTER — Other Ambulatory Visit (INDEPENDENT_AMBULATORY_CARE_PROVIDER_SITE_OTHER): Payer: Medicare Other

## 2016-01-30 VITALS — BP 110/70 | HR 57 | Temp 97.6°F | Ht 68.0 in | Wt 166.2 lb

## 2016-01-30 DIAGNOSIS — E039 Hypothyroidism, unspecified: Secondary | ICD-10-CM

## 2016-01-30 DIAGNOSIS — I70219 Atherosclerosis of native arteries of extremities with intermittent claudication, unspecified extremity: Secondary | ICD-10-CM

## 2016-01-30 DIAGNOSIS — D638 Anemia in other chronic diseases classified elsewhere: Secondary | ICD-10-CM

## 2016-01-30 DIAGNOSIS — G319 Degenerative disease of nervous system, unspecified: Secondary | ICD-10-CM

## 2016-01-30 DIAGNOSIS — I6523 Occlusion and stenosis of bilateral carotid arteries: Secondary | ICD-10-CM

## 2016-01-30 DIAGNOSIS — E7800 Pure hypercholesterolemia, unspecified: Secondary | ICD-10-CM | POA: Diagnosis not present

## 2016-01-30 DIAGNOSIS — M150 Primary generalized (osteo)arthritis: Secondary | ICD-10-CM

## 2016-01-30 DIAGNOSIS — N183 Chronic kidney disease, stage 3 unspecified: Secondary | ICD-10-CM

## 2016-01-30 DIAGNOSIS — R0600 Dyspnea, unspecified: Secondary | ICD-10-CM

## 2016-01-30 DIAGNOSIS — I25810 Atherosclerosis of coronary artery bypass graft(s) without angina pectoris: Secondary | ICD-10-CM

## 2016-01-30 DIAGNOSIS — I10 Essential (primary) hypertension: Secondary | ICD-10-CM

## 2016-01-30 DIAGNOSIS — M159 Polyosteoarthritis, unspecified: Secondary | ICD-10-CM

## 2016-01-30 LAB — COMPREHENSIVE METABOLIC PANEL
ALT: 13 U/L (ref 0–53)
AST: 17 U/L (ref 0–37)
Albumin: 4.1 g/dL (ref 3.5–5.2)
Alkaline Phosphatase: 76 U/L (ref 39–117)
BUN: 42 mg/dL — ABNORMAL HIGH (ref 6–23)
CALCIUM: 9.2 mg/dL (ref 8.4–10.5)
CHLORIDE: 106 meq/L (ref 96–112)
CO2: 26 meq/L (ref 19–32)
Creatinine, Ser: 2.36 mg/dL — ABNORMAL HIGH (ref 0.40–1.50)
GFR: 27.71 mL/min — AB (ref >60.00)
Glucose, Bld: 120 mg/dL — ABNORMAL HIGH (ref 70–99)
Potassium: 4.5 mEq/L (ref 3.5–5.1)
Sodium: 139 mEq/L (ref 135–145)
Total Bilirubin: 0.5 mg/dL (ref 0.2–1.2)
Total Protein: 7.2 g/dL (ref 6.0–8.3)

## 2016-01-30 LAB — CBC WITH DIFFERENTIAL/PLATELET
BASOS ABS: 0 10*3/uL (ref 0.0–0.1)
Basophils Relative: 0.4 % (ref 0.0–3.0)
Eosinophils Absolute: 0.2 10*3/uL (ref 0.0–0.7)
Eosinophils Relative: 3.1 % (ref 0.0–5.0)
HCT: 28.7 % — ABNORMAL LOW (ref 39.0–52.0)
Hemoglobin: 9.7 g/dL — ABNORMAL LOW (ref 13.0–17.0)
LYMPHS ABS: 1.5 10*3/uL (ref 0.7–4.0)
Lymphocytes Relative: 20.9 % (ref 12.0–46.0)
MCHC: 33.7 g/dL (ref 30.0–36.0)
MCV: 88.7 fl (ref 78.0–100.0)
MONO ABS: 1.2 10*3/uL — AB (ref 0.1–1.0)
MONOS PCT: 17.2 % — AB (ref 3.0–12.0)
NEUTROS PCT: 58.4 % (ref 43.0–77.0)
Neutro Abs: 4.1 10*3/uL (ref 1.4–7.7)
Platelets: 216 10*3/uL (ref 150.0–400.0)
RBC: 3.24 Mil/uL — AB (ref 4.22–5.81)
RDW: 13.5 % (ref 11.5–15.5)
WBC: 7 10*3/uL (ref 4.0–10.5)

## 2016-01-30 LAB — TSH: TSH: 2.78 u[IU]/mL (ref 0.35–4.50)

## 2016-01-30 LAB — LIPID PANEL
CHOL/HDL RATIO: 3
Cholesterol: 138 mg/dL (ref 0–200)
HDL: 47.5 mg/dL (ref >39.00)
LDL CALC: 76 mg/dL (ref 0–99)
NonHDL: 90.95
TRIGLYCERIDES: 73 mg/dL (ref 0.0–149.0)
VLDL: 14.6 mg/dL (ref 0.0–40.0)

## 2016-01-30 LAB — BRAIN NATRIURETIC PEPTIDE: PRO B NATRI PEPTIDE: 355 pg/mL — AB (ref 0.0–100.0)

## 2016-01-30 NOTE — Patient Instructions (Signed)
Today we updated your med list in our EPIC system...    Continue your current medications the same...  Today we rechecked your FASTING blood work...    We will contact you w/ the results when available...   Keep up the good work w/ diet & exercise...  Call for any questions...  Let's plan a follow up visit in 15mo, sooner if needed for problems.Marland KitchenMarland Kitchen

## 2016-02-15 ENCOUNTER — Ambulatory Visit: Payer: Medicare Other | Admitting: Pulmonary Disease

## 2016-03-07 ENCOUNTER — Ambulatory Visit (INDEPENDENT_AMBULATORY_CARE_PROVIDER_SITE_OTHER)
Admission: RE | Admit: 2016-03-07 | Discharge: 2016-03-07 | Disposition: A | Discharge status: Home / Self Care | Payer: Medicare Other | Source: Ambulatory Visit | Attending: Internal Medicine | Admitting: Internal Medicine

## 2016-03-07 ENCOUNTER — Ambulatory Visit (INDEPENDENT_AMBULATORY_CARE_PROVIDER_SITE_OTHER): Payer: Medicare Other | Admitting: Internal Medicine

## 2016-03-07 ENCOUNTER — Encounter: Payer: Self-pay | Admitting: Internal Medicine

## 2016-03-07 ENCOUNTER — Telehealth: Payer: Self-pay | Admitting: Pulmonary Disease

## 2016-03-07 VITALS — BP 130/70 | HR 60 | Temp 97.7°F | Ht 68.0 in | Wt 160.4 lb

## 2016-03-07 DIAGNOSIS — J209 Acute bronchitis, unspecified: Secondary | ICD-10-CM | POA: Diagnosis not present

## 2016-03-07 DIAGNOSIS — R05 Cough: Secondary | ICD-10-CM | POA: Diagnosis not present

## 2016-03-07 MED ORDER — PREDNISONE 10 MG PO TABS: ORAL_TABLET | ORAL | 0 refills | Status: DC

## 2016-03-07 MED ORDER — AMOXICILLIN-POT CLAVULANATE 875-125 MG PO TABS: 1.0000 | ORAL_TABLET | Freq: Two times a day (BID) | ORAL | 0 refills | Status: AC

## 2016-03-07 NOTE — Progress Notes (Signed)
Subjective:    Patient ID: Henry [name], male    DOB: 01-Jul-1924, 83 y.o.   MRN: [id number]  HPI 83 y/o WM here for a follow up visit... he has mult med problems as listed and is followed by DrBBrodie and DrPerry as well... he gets his meds filled at the [facility] which makes for a complicated array of perscribed meds and substituted alternatives... ~  SEE PREV EPIC NOTES FOR OLDER DATA >>     CXR 6/14 showed normal heart size, tortuous Ao, mild COPD changes w/ clear lungs/ NAD, DJD in Tspine...   LABS 6/14:  Chems- ok x BS=132 A1c=6.6 Creat=1.7;  CBC- Hg=11.3;  TSH=5.84;  B12=530;  BNP=198...  LABS 12/14:  FLP- at goals on Lip20;  Chems- ok x BS=131, A1c=6.9, Cr=1.8;  CBC= Hg= 10.0;  TSH=8.74 & rec to start Synthroid50...  LABS 7/15:  Chems- ok x BS=129, A1c=6.5, BUN=25, Cr=1.8;  CBC- ok x Hg=10.4, Fe=84 (27%sat), Ferritin=104;  TSH=3.50...  Art Dopplers 12/15 showed ABIs .79R & .71L, TBIs were abn but w/o risk of tissue loss   ~  March 01, 2014:  60mo ROV & Henry [name] has had recent f/u w/ [name] with adjustment in meds- [place]; he still has some edema & not taking his Lasix20 regularly;  We reviewed the following medical problems during today's office visit >>     HBP> on Metop25Bid, Amlod5, Losar100, Lasix20 (prn);  BP= 124/68 & he has persistent edema; denies CP, palpit, ch in SOB, etc...    ASHD> on ASA81; known CAD & s/p CABG 2000; followed by [name] & last seen 12/15- norm Myoview 7/14; he denies CP/ angina, palpit, SOB; needs to take Lasix20 daily for the edema.    Carotid Art Dis> on ASA81; he denies cerebral ischemic symptoms; CT/ MRI Brain 2/14 w/ atrophy & sm vessel dis; last CDoppler 1/13 showed mild heterogeneous plaque bilat w/ 40-59% bilat ICAstenoses & f/u is overdue...     Periph Vasc Dis> on ASA81; he has stable claud w/ ambulation; last Art Dopplers 12/15 showed ABIs .79R & .71L, TBIs were abn but w/o risk of tissue loss; overdue for yearly f/u  studies...    CHOL> on Lip20 (off prev Crestor); Labs 1/16 showed TChol 164, TG 151, HDL 36, LDL 98... Needs better diet.    DM> on diet alone (off prev Metform); Labs 12/14 showed BS= 131, A1c=6.9; Labs 1/16 showed BS= 127; we reviewed diet, exercise, etc....    Renal Insuffic> he knows to avoid NSAIDs; Labs 12/14 w/ Creat= 1.8 and Labs 1/16 showed Cr= 1.5    DJD, LBP w/ spinal stenosis> on Tramadol50 prn; followed by DrRamos=> DrBrooks at [facility]; they did MRI w/ report of sp stenosis severe at L4-5; surg 7/14 w/ lumbar decompression & fusion L4-5...    Anemia> on FeSO4 325mg /d; Labs 1/16 w/ Hg= 10.1 stable... We reviewed prob list, meds, xrays and labs> see below for updates >> he will get flu shot at [facility].  CXR 1/16 showed norm heart size, prior CABG, COPD w/ scarring at the bases, NAD...  LABS 1/16:  FLP- ok on Atorva20;  Chems- ok w/ stable Cr=1.5, BS=127;  CBC- mild chr anemia w/ Hg=10.1;  B12=408; TSH=9.10 off meds...  ~  August 31, 2014:  43mo ROV & Henry [name] has had a good interval- his CC's are "tightness in my legs" esp w/ walking & he has known ASPVD w/ f/u [name] soon, and feeling sl better on the  Synthroid50 regularly... We reviewed the following medical problems during today's office visit >>     HBP> on Metop25Bid, Amlod5, Losar100, Lasix20 (prn);  BP= 114/56 & he has persistent mild edema; denies CP, palpit, ch in SOB, etc...    ASHD> on ASA81; known CAD & s/p CABG 2000; followed by [name] & last seen 12/15- norm Myoview 7/14; he denies CP/ angina, palpit, SOB; needs to take Lasix20 daily for the edema.    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    Periph Vasc Dis> on ASA81; he has stable claud w/ ambulation; last Art Dopplers 12/15 showed ABIs .79R & .71L, TBIs were abn but w/o risk of tissue  loss; overdue for yearly f/u studies...    CHOL> on Lip20 (off prev Crestor); Labs 1/16 showed TChol 164, TG 151, HDL 36, LDL 98... Needs better diet.    DM> on diet alone (off prev Metform); Labs 1/16 showed BS= 127; Labs 7/16 showed BS=116, A1c=6.3; we reviewed diet, exercise, etc....    Renal Insuffic> he knows to avoid NSAIDs; Labs 12/14 w/ Creat= 1.8 and Labs 1/16 showed Cr= 1.5; Labs 7/16 w/ Cr=2.0 & rec to incr water intake...    DJD, LBP w/ spinal stenosis> on Tramadol50 prn; followed by DrRamos=> DrBrooks at [facility]; they did MRI w/ report of sp stenosis severe at L4-5; surg 7/14 w/ lumbar decompression & fusion L4-5...    Anemia> on FeSO4 325mg /d; Labs 1/16 w/ Hg= 10.1 stable; Labs 7/16 showed Hg=10, Fe=57 (20%sat), Ferritin=109; continue same... We reviewed prob list, meds, xrays and labs> see below for updates >>   LABS 7/16:  Chems- ok w/ BS=116, A1c=6.3, BUN=28, Cr=2.0;  CBC- anemic w/ Hg=10.0, Fe=57 (20%), Ferritin=109;  TSH=3.62... PLAN>>  Continue same meds, continue f/u [name], ROV w/ me in 75mo...  ~  March 23, 2015:  42mo ROV & Henry [name] returns for routine ROV- he tells me he recently fell on the ice & hurt left leg, ortho eval was neg for any fxs; otherw feeling well & he denies CP, palpit, SOB, edema; he denies cough, sput, hemoptysis, etc;  hw had Cards f/u w/ [name] 12/2014> f/u HBP, CAD, remote CABG, HL, ASPVD w/ claudication, CRI w/ Cr~2> BNP reported elev at [facility] & he was given more Lasix (40mg /d); f/u 2DEcho 02/08/15 showed mild LVH, norm LVF w/ EF=55-60%, no regional wall motion abn, Gr1DD, mod AoV calcif & mild AS/ trivAI, mild MR, mild LAdil, norm RVF & PAsys=29...    EXAM shows Afeb, VSS, O2sat=98% on RA;  HEENT- neg, mallampati2;  Chest- bibasilar crackles ~1/3rd way up w/o w/r;  Heart- RR gr1/6 SEM at apex & base, w/o r/g;  Abd- soft, neg;  Ext- left ankle swelling  EKG 12/2014 showed NSR, rate 63/min, Q in III, NSSTTWA, NAD...  2DEcho 02/08/15 showed mild LVH,  norm LVF w/ EF=55-60%, no regional wall motion abn, Gr1DD, mod AoV calcif & mild AS/ trivAI, mild MR, mild LAdil, norm RVF & PAsys=29  CDopplers 03/22/15>  bilat heterogen plaque, 40-59% bilat ICAstenoses- stable; norm subclav & vertebrals...   CXR 03/23/15>  Heart at upper lim of norm & s/p CABG; underlying COPD w/ flatening of diaph & chr incr markings at bases; mild DDD in Tspine; NAD- s/p CABG & chronic changes...  LABS 03/23/15>  FLP- all parameters at  goals on Lip10;  Chems- ok x BS=105, A1c=6.6, BUN=29, Cr=1.94, LFTs=wnl;  CBC- stable anemia w/ Hg=10.2, MCV=91; Fe=93 (30%sat), Ferritin=187, SPE/IEP- neg, no monoclonal prot... IMP/PLAN>>  Henry [name] is stable on his current regimen- continue same; reminded to be careful during the winter months; we plan ROV recheck in 62months...  ~  August 16, 2015:  20mo ROV & Henry [name] is c/o increased fatigue & bilat leg tightness w/ exertion (we discussed checking Art dopplers and ABIs); he also notes that his balance is off but no falls (we discussed a Neuro eval & poss PT rx but he declines and says he'll think about it);  We reviewed the following medical problems during today's office visit >>     HBP> on Metop25Bid, Amlod5, Losar100, Lasix40;  BP= 130/64 & he has persistent mild edema; denies CP, palpit, ch in SOB, etc...    ASHD> on ASA81; known CAD & s/p CABG 2000; followed by [name] & last seen 01/24/15- norm Myoview 7/14; he denies CP/ angina, palpit, SOB; needs to take Lasix20 daily for the edema.    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    Periph Vasc Dis> on ASA81; he has stable claud w/ ambulation; last Art Dopplers 12/15 showed ABIs .79R & .71L, TBIs were abn but w/o risk of tissue loss; now c/o increased leg symptoms- ArtDopplers pending...    CHOL> on Lip20; Labs 1/16  showed TChol 164, TG 151, HDL 36, LDL 98... Needs better diet.    DM> on diet alone (off prev Metform); Labs 1/16 showed BS= 127; Labs 7/16 showed BS=116, A1c=6.3; we reviewed diet, exercise, etc....    Hypothyroid> on Synthroid50    Renal Insuffic> he knows to avoid NSAIDs; Labs 12/14 w/ Creat= 1.8 and Labs 1/16 showed Cr= 1.5; Labs 7/16 w/ Cr=2.0 & rec to incr water intake...    DJD, LBP w/ spinal stenosis> on Tramadol50 prn; followed by DrRamos=> DrBrooks at [facility]; they did MRI w/ report of sp stenosis severe at L4-5; surg 7/14 w/ lumbar decompression & fusion L4-5...    Anemia> on FeSO4 325mg /d; Labs 1/16 w/ Hg= 10.1 stable; Labs 7/16 showed Hg=10, Fe=57 (20%sat), Ferritin=109; continue same... EXAM shows Afeb, VSS, O2sat=100% on RA;  HEENT- neg, mallampati2;  Chest- bibasilar crackles ~1/3rd way up w/o w/r;  Heart- RR gr1/6 SEM at apex & base, w/o r/g;  Abd- soft, neg;  Ext- left ankle swelling  LABS 08/16/15>  Chems- ok w/ K=5.3, BS=107, A1c=6.3, BUN=37, Cr=2.17;  CBC- Hg=10.4, otherw ok...   ArtDopplers LEs 08/24/15>  Known bilat SFA disease- R-ABI=0.83 (mild), L-ABI=0.79 (mod); TBIs are R-0.65 & L-0.52...  IMP/PLAN>>  Henry [name] is 83 y/o w/ multisys dis, known ASHD & PVD, continue same meds and try to incr exercise; we discussed low sodium, elev legs, support hose, continue Lasix40; he will maintain f/u w/ Cards- [name]...       03/07/2016 acute extended ov/Henry [name] re: uri Chief Complaint  Patient presents with  . Acute Visit    Pt c/o non prod cough and chest chest congestion for the past 10 days. He also has had some rhinitis.    acute onset x 10d "like a usual head cold " then about 5 days later abruptly severe coughing fits  with brown mucus assoc with nasal congestion watery but no sob / cough  worse at hs and in am   No obvious day to day or daytime variability or assoc   cp or chest tightness, subjective wheeze or overt   hb symptoms. No unusual exp hx or h/o childhood pna/ asthma  or knowledge of premature birth.  Sleeping ok without nocturnal  or early am exacerbation  of respiratory  c/o's or need for noct saba. Also denies any obvious fluctuation of symptoms with weather or environmental changes or other aggravating or alleviating factors except as outlined above   Current Medications, Allergies, Complete Past Medical History, Past Surgical History, Family History, and Social History were reviewed in [employer] record.  ROS  The following are not active complaints unless bolded sore throat, dysphagia, dental problems, itching, sneezing,  nasal congestion or excess/ purulent secretions, ear ache,   fever, chills, sweats, unintended wt loss, classically pleuritic or exertional cp, hemoptysis,  orthopnea pnd or leg swelling, presyncope, palpitations, abdominal pain, anorexia, nausea, vomiting, diarrhea  or change in bowel or bladder habits, change in stools or urine, dysuria,hematuria,  rash, arthralgias, visual complaints, headache, numbness, weakness or ataxia or problems with walking or coordination,  change in mood/affect or memory.             Objective:   Physical Exam   WD, WN,elderly wm nad   Wt Readings from Last 3 Encounters:  03/07/16 160 lb 6.4 oz (72.8 kg)  01/30/16 166 lb 4 oz (75.4 kg)  08/16/15 164 lb 12.8 oz (74.8 kg)    Vital signs reviewed  - Note on arrival 02 sats  90% on RA      HEENT: nl dentition,  and oropharynx. Nl external ear canals without cough reflex - moderate bilateral non-specific turbinate edema     NECK :  without JVD/Nodes/TM/ nl carotid upstrokes bilaterally   LUNGS: no acc muscle use,  Nl contour chest with nl Percussion but  bilateral  insp and exp rhonchi but no true wheezes    CV:  RRR  no s3 or murmur or increase in P2, nad no edema   ABD:  soft and nontender with nl inspiratory excursion in the supine position. No bruits or organomegaly appreciated, bowel sounds nl  MS:  Nl gait/ ext  warm without deformities, calf tenderness, cyanosis or clubbing No obvious joint restrictions   SKIN: warm and dry without lesions    NEURO:  alert, approp, nl sensorium with  no motor or cerebellar deficits apparent.      CXR PA and Lateral:   03/07/2016 :    I personally reviewed images and agree with radiology impression as follows:    1. Chronic and postop changes as above.  No acute disease.   .   Assessment & Plan:

## 2016-03-07 NOTE — Patient Instructions (Addendum)
Change mucinex to mucinex DM 1200 mg every 12 hours as needed for cough / congestion   Prednisone 10 mg take  4 each am x 2 days,   2 each am x 2 days,  1 each am x 2 days and stop   Augmentin 875 mg take one pill twice daily  X 10 days - take at breakfast and supper with large glass of water.  It would help reduce the usual side effects (diarrhea and yeast infections) if you ate cultured yogurt at lunch.   Please remember to go to the  x-ray department downstairs for your tests - we will call you with the results when they are available.  Call on Friday 03/10/15  if not improving

## 2016-03-07 NOTE — Telephone Encounter (Signed)
Spoke with pt, requesting asap appt for chest congestion.  Pt scheduled today with MW at 3:30.  Nothing further needed.

## 2016-03-11 NOTE — Assessment & Plan Note (Addendum)
Probably started as a viral uri but not evolving to purulent rhintis/bonchitis with ? Secondary sinusitis and at risk for AB > rx augmentin/ pred x 6 d only and f/u with sinus ct if not improving  I had an extended discussion with the patient reviewing all relevant studies completed to date and  lasting 15 to 20 minutes of a 25 minute an acute office visit in pt not previously known to me with mod severe but non-specific resp symptoms of unknown etiology     Each maintenance medication was reviewed in detail including most importantly the difference between maintenance and prns and under what circumstances the prns are to be triggered using an action plan format that is not reflected in the computer generated alphabetically organized AVS.    Please see AVS for specific instructions unique to this visit that I personally wrote and verbalized to the the pt in detail and then reviewed with pt  by my nurse highlighting any  changes in therapy recommended at today's visit to their plan of care.

## 2016-03-20 ENCOUNTER — Other Ambulatory Visit: Payer: Self-pay | Admitting: Pulmonary Disease

## 2016-03-20 DIAGNOSIS — I6523 Occlusion and stenosis of bilateral carotid arteries: Secondary | ICD-10-CM

## 2016-03-21 ENCOUNTER — Ambulatory Visit (HOSPITAL_COMMUNITY)
Admission: RE | Admit: 2016-03-21 | Discharge: 2016-03-21 | Disposition: A | Discharge status: Home / Self Care | Payer: Medicare Other | Source: Ambulatory Visit | Attending: Cardiovascular Disease | Admitting: Cardiovascular Disease

## 2016-03-21 DIAGNOSIS — I6523 Occlusion and stenosis of bilateral carotid arteries: Secondary | ICD-10-CM | POA: Insufficient documentation

## 2016-03-28 NOTE — Progress Notes (Signed)
Atc pt and received busy tone. Will call back at later time.

## 2016-07-26 ENCOUNTER — Encounter: Payer: Self-pay | Admitting: Pulmonary Disease

## 2016-07-26 NOTE — Progress Notes (Signed)
Subjective:    Patient ID: Henry [name], male    DOB: February 03, 1925, 83 y.o.   MRN: [id number]  HPI 83 y/o WM here for a follow up visit... he has mult med problems as listed and is followed by DrBBrodie and DrPerry as well... he gets his meds filled at the [facility] which makes for a complicated array of perscribed meds and substituted alternatives... ~  SEE PREV EPIC NOTES FOR OLDER DATA >>     CXR 6/14 showed normal heart size, tortuous Ao, mild COPD changes w/ clear lungs/ NAD, DJD in Tspine...   LABS 6/14:  Chems- ok x BS=132 A1c=6.6 Creat=1.7;  CBC- Hg=11.3;  TSH=5.84;  B12=530;  BNP=198...  LABS 12/14:  FLP- at goals on Lip20;  Chems- ok x BS=131, A1c=6.9, Cr=1.8;  CBC= Hg= 10.0;  TSH=8.74 & rec to start Synthroid50...  LABS 7/15:  Chems- ok x BS=129, A1c=6.5, BUN=25, Cr=1.8;  CBC- ok x Hg=10.4, Fe=84 (27%sat), Ferritin=104;  TSH=3.50...  Art Dopplers 12/15 showed ABIs .79R & .71L, TBIs were abn but w/o risk of tissue loss   ~  March 01, 2014:  20mo ROV & Henry [name] has had recent f/u w/ [name] with adjustment in meds- [facility]; he still has some edema & not taking his Lasix20 regularly;  We reviewed the following medical problems during today's office visit >>     HBP> on Metop25Bid, Amlod5, Losar100, Lasix20 (prn);  BP= 124/68 & he has persistent edema; denies CP, palpit, ch in SOB, etc...    ASHD> on ASA81; known CAD & s/p CABG 2000; followed by [name] & last seen 12/15- norm Myoview 7/14; he denies CP/ angina, palpit, SOB; needs to take Lasix20 daily for the edema.    Carotid Art Dis> on ASA81; he denies cerebral ischemic symptoms; CT/ MRI Brain 2/14 w/ atrophy & sm vessel dis; last CDoppler 1/13 showed mild heterogeneous plaque bilat w/ 40-59% bilat ICAstenoses & f/u is overdue...     Periph Vasc Dis> on ASA81; he has stable claud w/ ambulation; last Art Dopplers 12/15 showed ABIs .79R & .71L, TBIs were abn but w/o risk of tissue loss; overdue for yearly f/u  studies...    CHOL> on Lip20 (off prev Crestor); Labs 1/16 showed TChol 164, TG 151, HDL 36, LDL 98... Needs better diet.    DM> on diet alone (off prev Metform); Labs 12/14 showed BS= 131, A1c=6.9; Labs 1/16 showed BS= 127; we reviewed diet, exercise, etc....    Renal Insuffic> he knows to avoid NSAIDs; Labs 12/14 w/ Creat= 1.8 and Labs 1/16 showed Cr= 1.5    DJD, LBP w/ spinal stenosis> on Tramadol50 prn; followed by DrRamos=> DrBrooks at [facility]; they did MRI w/ report of sp stenosis severe at L4-5; surg 7/14 w/ lumbar decompression & fusion L4-5...    Anemia> on FeSO4 325mg /d; Labs 1/16 w/ Hg= 10.1 stable... We reviewed prob list, meds, xrays and labs> see below for updates >> he will get flu shot at [facility].  CXR 1/16 showed norm heart size, prior CABG, COPD w/ scarring at the bases, NAD...  LABS 1/16:  FLP- ok on Atorva20;  Chems- ok w/ stable Cr=1.5, BS=127;  CBC- mild chr anemia w/ Hg=10.1;  B12=408; TSH=9.10 off meds...  ~  August 31, 2014:  40mo ROV & Henry [name] has had a good interval- his CC's are "tightness in my legs" esp w/ walking & he has known ASPVD w/ f/u [name] soon, and feeling sl better on the  Synthroid50 regularly... We reviewed the following medical problems during today's office visit >>     HBP> on Metop25Bid, Amlod5, Losar100, Lasix20 (prn);  BP= 114/56 & he has persistent mild edema; denies CP, palpit, ch in SOB, etc...    ASHD> on ASA81; known CAD & s/p CABG 2000; followed by [name] & last seen 12/15- norm Myoview 7/14; he denies CP/ angina, palpit, SOB; needs to take Lasix20 daily for the edema.    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    Periph Vasc Dis> on ASA81; he has stable claud w/ ambulation; last Art Dopplers 12/15 showed ABIs .79R & .71L, TBIs were abn but w/o risk of tissue  loss; overdue for yearly f/u studies...    CHOL> on Lip20 (off prev Crestor); Labs 1/16 showed TChol 164, TG 151, HDL 36, LDL 98... Needs better diet.    DM> on diet alone (off prev Metform); Labs 1/16 showed BS= 127; Labs 7/16 showed BS=116, A1c=6.3; we reviewed diet, exercise, etc....    Renal Insuffic> he knows to avoid NSAIDs; Labs 12/14 w/ Creat= 1.8 and Labs 1/16 showed Cr= 1.5; Labs 7/16 w/ Cr=2.0 & rec to incr water intake...    DJD, LBP w/ spinal stenosis> on Tramadol50 prn; followed by DrRamos=> DrBrooks at [facility]; they did MRI w/ report of sp stenosis severe at L4-5; surg 7/14 w/ lumbar decompression & fusion L4-5...    Anemia> on FeSO4 325mg /d; Labs 1/16 w/ Hg= 10.1 stable; Labs 7/16 showed Hg=10, Fe=57 (20%sat), Ferritin=109; continue same... We reviewed prob list, meds, xrays and labs> see below for updates >>   LABS 7/16:  Chems- ok w/ BS=116, A1c=6.3, BUN=28, Cr=2.0;  CBC- anemic w/ Hg=10.0, Fe=57 (20%), Ferritin=109;  TSH=3.62... PLAN>>  Continue same meds, continue f/u [name], ROV w/ me in 25mo...  ~  March 23, 2015:  38mo ROV & Henry [name] returns for routine ROV- he tells me he recently fell on the ice & hurt left leg, ortho eval was neg for any fxs; otherw feeling well & he denies CP, palpit, SOB, edema; he denies cough, sput, hemoptysis, etc;  hw had Cards f/u w/ [name] 12/2014> f/u HBP, CAD, remote CABG, HL, ASPVD w/ claudication, CRI w/ Cr~2> BNP reported elev [facility] & he was given more Lasix (40mg /d); f/u 2DEcho 02/08/15 showed mild LVH, norm LVF w/ EF=55-60%, no regional wall motion abn, Gr1DD, mod AoV calcif & mild AS/ trivAI, mild MR, mild LAdil, norm RVF & PAsys=29...    EXAM shows Afeb, VSS, O2sat=98% on RA;  HEENT- neg, mallampati2;  Chest- bibasilar crackles ~1/3rd way up w/o w/r;  Heart- RR gr1/6 SEM at apex & base, w/o r/g;  Abd- soft, neg;  Ext- left ankle swelling  EKG 12/2014 showed NSR, rate 63/min, Q in III, NSSTTWA, NAD...  2DEcho 02/08/15 showed mild LVH,  norm LVF w/ EF=55-60%, no regional wall motion abn, Gr1DD, mod AoV calcif & mild AS/ trivAI, mild MR, mild LAdil, norm RVF & PAsys=29  CDopplers 03/22/15>  bilat heterogen plaque, 40-59% bilat ICAstenoses- stable; norm subclav & vertebrals...   CXR 03/23/15>  Heart at upper lim of norm & s/p CABG; underlying COPD w/ flatening of diaph & chr incr markings at bases; mild DDD in Tspine; NAD- s/p CABG & chronic changes...  LABS 03/23/15>  FLP- all parameters at  goals on Lip10;  Chems- ok x BS=105, A1c=6.6, BUN=29, Cr=1.94, LFTs=wnl;  CBC- stable anemia w/ Hg=10.2, MCV=91; Fe=93 (30%sat), Ferritin=187, SPE/IEP- neg, no monoclonal prot... IMP/PLAN>>  Henry [name] is stable on his current regimen- continue same; reminded to be careful during the winter months; we plan ROV recheck in 36months...  ~  August 16, 2015:  67mo ROV & Henry [name] is c/o increased fatigue & bilat leg tightness w/ exertion (we discussed checking Art dopplers and ABIs); he also notes that his balance is off but no falls (we discussed a Neuro eval & poss PT rx but he declines and says he'll think about it);  We reviewed the following medical problems during today's office visit >>     HBP> on Metop25Bid, Amlod5, Losar100, Lasix40;  BP= 130/64 & he has persistent mild edema; denies CP, palpit, ch in SOB, etc...    ASHD> on ASA81; known CAD & s/p CABG 2000; followed by [name] & last seen 01/24/15- norm Myoview 7/14; he denies CP/ angina, palpit, SOB; needs to take Lasix20 daily for the edema.    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    Periph Vasc Dis> on ASA81; he has stable claud w/ ambulation; last Art Dopplers 12/15 showed ABIs .79R & .71L, TBIs were abn but w/o risk of tissue loss; now c/o increased leg symptoms- ArtDopplers pending...    CHOL> on Lip20; Labs 1/16  showed TChol 164, TG 151, HDL 36, LDL 98... Needs better diet.    DM> on diet alone (off prev Metform); Labs 1/16 showed BS= 127; Labs 7/16 showed BS=116, A1c=6.3; we reviewed diet, exercise, etc....    Hypothyroid> on Synthroid50    Renal Insuffic> he knows to avoid NSAIDs; Labs 12/14 w/ Creat= 1.8 and Labs 1/16 showed Cr= 1.5; Labs 7/16 w/ Cr=2.0 & rec to incr water intake...    DJD, LBP w/ spinal stenosis> on Tramadol50 prn; followed by DrRamos=> DrBrooks at [facility]; they did MRI w/ report of sp stenosis severe at L4-5; surg 7/14 w/ lumbar decompression & fusion L4-5...    Anemia> on FeSO4 325mg /d; Labs 1/16 w/ Hg= 10.1 stable; Labs 7/16 showed Hg=10, Fe=57 (20%sat), Ferritin=109; continue same... EXAM shows Afeb, VSS, O2sat=100% on RA;  HEENT- neg, mallampati2;  Chest- bibasilar crackles ~1/3rd way up w/o w/r;  Heart- RR gr1/6 SEM at apex & base, w/o r/g;  Abd- soft, neg;  Ext- left ankle swelling  LABS 08/16/15>  Chems- ok w/ K=5.3, BS=107, A1c=6.3, BUN=37, Cr=2.17;  CBC- Hg=10.4, otherw ok...   ArtDopplers LEs 08/24/15>  Known bilat SFA disease- R-ABI=0.83 (mild), L-ABI=0.79 (mod); TBIs are R-0.65 & L-0.52...  IMP/PLAN>>  Henry [name] is 83 y/o w/ multisys dis, known ASHD & PVD, continue same meds and try to incr exercise; we discussed low sodium, elev legs, support hose, continue Lasix40; he will maintain f/u w/ Cards- [name]...   ~  January 30, 2016:  70mo [place] reports doing satis- no new complaints or concerns;  He is still caring for wife (28) w/ alz dis at home, they have 5 children- 2 in [place], 1 in [place], 1 in [place], and 1 daugh in [place])... We reviewed the following medical problems during today's office visit >> Note: he gets his meds from the [place].    HBP> on Metop25Bid, Amlod10, Losar100,  Lasix20;  BP= 110/70 & he has intermittent mild edema; denies CP, palpit, ch in SOB, etc...    ASHD> on ASA81; known CAD & s/p CABG 2000; followed by [name] & last  seen 01/24/15- norm Myoview 7/14; he denies CP/ angina, palpit, SOB; needs to take Lasix20 daily for the edema.    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    Periph Vasc Dis> on ASA81; he has stable claud w/ ambulation ("tight in legs"); Art Dopplers 12/15 showed ABIs .79R & .71L, TBIs were abn but w/o risk of tissue loss; f/u ArtDopplers 07/2015 were stable: .83R & .79L w/ similar TBIs    CHOL> on Lip20-1/2 tab/d; Labs 12/17 showed TChol 138, TG 73, HDL 48, LDL 76... Continue same...    DM> on diet alone (off prev Metform); Labs 12/17 showed BS= 120; Labs 7/16 showed BS=116, A1c=6.3; we reviewed diet, exercise, etc....    Hypothyroid> on Synthroid45mcg/d w/ Labs 12/17 showing TSH=2.78    Renal Insuffic> he knows to avoid NSAIDs; Labs 12/14 w/ Creat= 1.8 and Labs 1/16 showed Cr= 1.5; Labs 7/16 w/ Cr=2.0 & rec to incr water intake & most recent lab 12/17 showed Cr=2.36...    DJD, LBP w/ spinal stenosis> on Tramadol50 prn; followed by DrRamos=> DrBrooks at [facility]; they did MRI w/ report of sp stenosis severe at L4-5; surg 7/14 w/ lumbar decompression & fusion L4-5...    Anemia> on FeSO4 325mg /d, MVI & AREDS2; Labs 1/16 w/ Hg= 10.1 stable; Labs 7/16 showed Hg=10, Fe=57 (20%sat), Ferritin=109; most recent lab 12/17 showed Hg= 9.7.Marland KitchenMarland Kitchen EXAM shows Afeb, VSS, O2sat=100% on RA;  HEENT- neg, mallampati2;  Chest- bibasilar crackles ~1/3rd way up w/o w/r;  Heart- RR gr1/6 SEM at apex & base, w/o r/g;  Abd- soft, neg;  Ext- left ankle swelling  LABS 01/30/16>  FLP- all parameters at goals on Lip20-1/2 tab Qd;  Chems- ok x BS=120, BUN=42, Cr=2.36;  CBC- anemic w/ Hg=9.7;  TSH=2.78;  BNP=355  IMP/PLAN>>  Henry [name] is stable w/ multisystem dis, continue same meds and regular clinical follow up          Problem List:  HYPERTENSION  (ICD-401.9) >>  ~  controlled on 4 meds:  METOPROLOL 25mg Bid, AMLODIPINE 10mg /d, LOSARTAN 25mg /d, and HCT 25mg - 1/2 qd...  ~  Christie showed mild LVH, AoV sclerosis & mild AI, EF= 60%... ~  labs 11/11 showed BUN=36, Creat=2.1, K=5.1.Marland KitchenMarland Kitchen rec to stop HCTZ... ~  [facility] subseq re-started 12.5mg  HCTZ in the interim due to edema... ~  6/14:  on Metop25/d, Amlod10, Losar25, Lasix20;  BP= 160/64 but he is in pain & ?took his meds today; he denies CP, palpit, ch in SOB, edema, etc; asked to incr Metop25Bid.  ~  CXR 6/14 showed norm heart size, atherosclerotic & tort Ao, emphysematous changes in upper lobes & scarring in lower lobes, DJD in spine, NAD... ~  12/14: on Metop25Bid, Amlod10, Losar25-3/d, Lasix20;  BP= 144/60 & he denies CP, palpit, ch in SOB, edema, etc. ~  7/15: BP controlled on Metop25Bid, Amlod10, Losar25-3/d, Lasix20;  BP= 120/74 7 he denies CP, palpit, dyspnea, etc; Labs showed K=4.7, BUN=25, Cr=1.8 stable. ~  1/16: on Metop25Bid, Amlod5, Losar100, Lasix20 (prn);  BP= 124/68 & he has persistent edema; denies CP, palpit, ch in SOB, etc. ~  7/16: on Metop25Bid,  Amlod5, Losar100, Lasix20 (prn);  BP= 114/56 & he remains stable...  ARTERIOSCLEROTIC HEART DISEASE (ICD-414.00) - on ASA 81mg /d... long time pt of DrJoeLeBauer & followed by DrBrodie now... s/p CABG in 2000 by DrBartle (for L main dis)... he is active & w/o angina, palpit, change in SOB/ DOE, etc... ~  NuclearStressTest 9/08 was normal without ischemia and EF=68%... ~  12/13: he saw [name] for f/u CAD, s/p CABG, PAD, Carotid Art dis> no angina, bilat calf pain w/ ambulation but felt to be stable; he [facility], but [facility]... ~  EKG 6/14 showed SBrady, rate56, 1st degree AVB, otherw wnl... ~  7/14:  Myoview showed normal wall motion & EF=61%, no CP or EKG changes... ~  Sep 20, 2022:  He has ASHD & ASPVD on ASA81; as noted he is active & denies angina etc; he saw [name] 12/14 & stable, continue medical therapy &  exercise. ~  1/16: on ASA81; known CAD & s/p CABG 2000; followed by [name] & last seen 12/15- norm Myoview 7/14; he denies CP/ angina, palpit, SOB; needs to take Lasix20 daily for the edema.  CAROTID ARTERY DISEASE>  PERIPHERAL VASCULAR DISEASE (ICD-443.9) - on ASA daily and he has bilat femoral bruits... he has seen DrDowney in the past (now [name]) w/ exerc program recommended... he walks regularly w/ stable claud symptoms, no sores or lesions on LE's... ~  ABI's 10/08 were normal bilat = 1.0.Marland KitchenMarland Kitchen waveforms were biphasic however (no change from 2005). ~  CDoppler's 10/08 showed mild bilat carotid dis w/ 40-59% RICA stenosis, and 8-52% LICA stenosis. ~  CDoppler's 10/09 showed the same= stable. ~  CDoppler's 11/10 showed stable mild carotid dis bilat> 77-82% RICA & 4-23% LICA stenoses. ~  ABI's 11/10 showed prob mod severe >50% distal right SFA stenosis, & mod dis in left SFA, ABI's=0.9 ~  Podiarty sent him to [facility] for ArtDopplers 9/11> ABIs 0.82 R & 0.74L w/ bilat SFA dis L>R, toe pressures adeq for healing... ~  6/14: on ASA81; he denies cerebral ischemic symptoms; CT/ MRI Brain 2/14 showed sm vessel dis, vasc calcif, global atrophy (no hemorrhage or large area infarct); last CDoppler 1/13 showed mild heterogeneous plaque bilat w/ 40-59% bilat ICAstenoses... ~  6/14: on ASA81; he has stable claud w/ ambulation but lim by back pain now; last Art Dopplers 1/13 showed ABIs .92R & .80L, TBIs were abn but w/o risk of tissue loss; overdue for yearly f/u studies of Carotids & ABIs... ~  20-Sep-2022: He has ASHD & ASPVD on ASA81; as noted he is active & denies angina etc; he saw [name] 12/14 & stable, continue medical therapy & exercise. ~  CDoppler 1/16 showed mild heterogeneous plaque bilat w/ 40-59% bilat ICAstenoses & norm subclav/ antegrade verts.  HYPERCHOLESTEROLEMIA (ICD-272.0) - prev controlled on Simva40 + diet, [facility] CRESTOR 10mg /d. ~  [place] 2/09 on Simva40 showed Tchol 169, TG  206, HDL 37, LDL 96 ~  FLP 4/10 on Simva40 showed TChol 146, TG 145, HDL 36, LDL 82 ~  FLP 10/10 on Simva40 showed TChol 166, TG 130, HDL 38, LDL 102 ~  FLP 4/11 on Simva40 showed TChol 138, TG 87, HDL 40, LDL 80... [facility] subseq ch to Cres10 ~  FLP 11/11 on Cres10 showed TChol 147, TG 161, HDL 34, LDL 81 ~  6/12:  He indicates interval labs done by the [facility] but he didn't bring copies for [place] to review... ~  6/14: on Lip20 (off prev  Crestor); Labs done at [facility] but he didn't bring reports; he is not fasting today for recheck.  ~  [place] 12/14 on Lip20 showed TChol 127, TG 56, HDL 41, LDL 75  ~  FLP 1/16 on Lip20 showed TChol 164, TG 151, HDL 36, LDL 98  DIABETES MELLITUS (ICD-250.00) - he's struggled w/ diet Rx... wt stable ~175 range... prev on Metformin 500Bid but [facility] changed to GLYBURIDE 5mg /d, then decr to 1/2 tab daily due to side effects & he stopped due to hypogly. ~  labs 10/08 showed FBS= 134, and HgA1c= 5.4... ~  labs 6/09 showed BS= 101, HgA1c= 6.4.Marland KitchenMarland Kitchen monofilament test is normal. ~  Eye eval 1/10 at [facility] showed no DM retinopathy... ~  labs 4/10 on MetformBid showed BS= 106, A1c= 6.8, Urine Microalb= sl incr @ 7.8mg % ~  labs 10/10 showed BS= 119, A1c= 6.5...  NOTE: 2/11 [facility] ch to Glybur5mg - 1/2 daily. ~  labs 4/11 on Glyb2.5 showed BS= 97, A1c= 6.6.Marland KitchenMarland Kitchen pt subseq stopped Glybur due to low sugars. ~  labs 11/11 off meds showed BS= 149, A1c= 6.7.Marland KitchenMarland Kitchen coninue diet Rx. ~  6/12:  He indicates interim labs done by the [facility] but he didn't bring copies for [place] to review... ~  6/14: on diet alone (off prev Metform); Labs 2/14 Hosp w/ BS= 150-200+ range; now BS=132, A1c=6.6; he follows low carb diet ~  Labs 12/14 on diet alone (weight up 13# to 173#) showed BS= 131, A1c= 6.9 ~  7/15: He has DM controlled on diet alone;weight stable at 171#, BMI=25-6; Labs today showed BS=129, A1c=6.5; continue diet management. ~  1/16: on diet alone (off prev Metform); Labs 12/14 showed BS= 131, A1c=6.9; Labs 1/16  showed BS= 127; we reviewed diet, exercise, etc ~  7/16:  On diet alone> Labs 7/16 showed BS=116, A1c=6.3; we reviewed diet, exercise, etc...  BORDERLINE TSH >> ~  Serial labs showed rising TSH> 3.57 => 5.84 => 8.74 (12/14) and we decided to start Synthroid66mcg/d;  [facility]how the [facility] told [facility] to [facility] this [facility] & he did not call to check w/ [place]... ~  7/15  Follow up TSH= 3.50 not on meds, clinically euthyroid, good energy, etc;  we will follow closely...  ~  1/16:  Labs here 1/16 showed TSH= 9.10 & he is requested to restart the Levothy33mcg/d... ~  7/16: now on Levothy50 every day> Labs 7/16 showed TSH= 3.62, continue same.  RENAL INSUFFICIENCY (ICD-588.9) - Creat 1.8-2.0 in the past... ~  labs 6/09 showed BUN= 31, Creat= 1.6... ~  labs 9/09 showed BUN= 25, Creat= 1.6, K= 4.5 ~  labs 4/10 showed BUN= 26, Creat= 1.6, K= 5.0 ~  labs 10/10 showed BUN= 21, Creat= 1.6 ~  labs 4/11 showed BUN= 30, Creat= 1.9..Marland Kitchen advised to stay well hydrated. ~  labs 11/11 showed BUN= 36, Creat= 2.1.Marland KitchenMarland Kitchen rec to stop the HCTZ ~  6/12:  He indicates that the [facility] restarted HCTZ 12.5mg /d & they are following labs... ~  6/14: he knows to avoid NSAIDs; Labs 2/14 w/ Creat= 1.4-1.6 range; Labs today showed BUN= 21, Creat= 1.7... ~  Labs 12/14 showed BUN= 23, Creat= 1.8 ~  Labs 7/15 showed BUN= 25, Cr= 1.8 ~  Labs 1/16 showed BUN= 25, Cr= 1.5 ~  Labs 7/16 showed BUN= 28, Cr= 2.0  DEGENERATIVE JOINT DISEASE (ICD-715.90) LOW BACK PAIN >> he has severe sp stenosis L4-5 per MRI at [facility] 6/14... ~  Notes and Records fro [facility]- DrRamos, DrBrooks>  reviewed, they plan surgery if cleared fro CV standpoint...  Hx of ANEMIA OF CHRONIC DISEASE (ICD-285.29) - eval by DrGranfortuna w/ ACD secondary to his renal insuffic and Rx w/ aranesp periodically... nothing else found on his extensive eval... he was taking Ferrous Gluconate daily (it no longer makes him tired)... ~  labs 2/09 w/ Hg= 11.2.Marland Kitchen. ~  labs 6/09 w/ Hg= 11.0.Marland Kitchen.  rec> OTC Fe prep but he never got it! ~  labs 9/09 showed Hg= 10.5, Fe= 81 ~  labs 4/10 showed Hg= 10.5, MCV= 92, Fe= 77 (22%sat), B12= 590, Folate>20... rec> OTC Fe daily. ~  labs 10/10 showed Hg= 10.3, Fe= 88 ~  labs 4/11 showed Hg= 10.9,  Fe= 58... continue Ferrous Gluc daily w/ VitC ~  labs 11/11 showed Hg= 11.6 ~  6/12:  Again he indicated that the [facility] is following his labs, aske to bring copies for [place] to review. ~  6/14: on FeSO4 325mg /d; Labs 2/14 w/ Hg= 10-11 range; Labs now show Hg= 11.3 ~  Labs 12/14 showed Hg= 10.0 ~  7/15: He has a chronic anemia & f/u labs today showed Hg=10.4 (stable) w/ MCV=91, Fe=84 (27%), Ferritin 104; he continues on MVI, Fe, etc. ~  1/16: on FeSO4 325mg /d; Labs 1/16 w/ Hg= 10.1 stable. ~  7/16: on Fe daily> Labs showed Hg=10, Fe=57 (20%sat), Ferritin=109, continue same...   Past Surgical History:  Procedure Laterality Date  . cataract surgery    . COLONOSCOPY     Hx: of  . CORONARY ARTERY BYPASS GRAFT  2000   by Dr. Cyndia Bent  . LUMBAR LAMINECTOMY/DECOMPRESSION MICRODISCECTOMY N/A 09/10/2012   Procedure: LUMBAR DECOMPRESSION,  IN SITU FUSION LUMBAR 4-5;  Surgeon: Melina Schools, MD;  Location: [place];  Service: Orthopedics;  Laterality: N/A;  . TONSILLECTOMY      Outpatient Encounter Prescriptions as of 01/30/2016  Medication Sig  . amLODipine (NORVASC) 10 MG tablet Take 10 mg by mouth daily.   Marland Kitchen aspirin EC 81 MG tablet Take 81 mg by mouth every morning.   Marland Kitchen atorvastatin (LIPITOR) 20 MG tablet Take 10 mg by mouth every evening.   . Calcium Carbonate-Vitamin D (CALCIUM 600/VITAMIN D PO) Take 1 tablet by mouth 2 (two) times daily.   . ferrous gluconate (FERGON) 325 MG tablet Take 325 mg by mouth daily with breakfast.   . furosemide (LASIX) 20 MG tablet Take 20 mg by mouth daily.  Marland Kitchen glucose blood (ACCU-CHEK AVIVA PLUS) test strip CHECK BLOOD SUGAR TWICE A DAY.  Marland Kitchen Lancets (ACCU-CHEK MULTICLIX) lancets USE TWICE DAILY.  Marland Kitchen levothyroxine (SYNTHROID,  LEVOTHROID) 50 MCG tablet TAKE ONE TABLET BY MOUTH ONCE DAILY BEFORE BREAKFAST  . losartan (COZAAR) 100 MG tablet Take 100 mg by mouth daily.  . metoprolol tartrate (LOPRESSOR) 25 MG tablet Take 1 tablet (25 mg total) by mouth 2 (two) times daily.  . Multiple Vitamins-Minerals (CENTRUM SILVER PO) Take 1 tablet by mouth every morning.   . Multiple Vitamins-Minerals (PRESERVISION AREDS 2 PO) Take 1 tablet by mouth every evening.  . [DISCONTINUED] furosemide (LASIX) 40 MG tablet Take 1 tablet (40 mg total) by mouth daily. (Patient not taking: Reported on 01/30/2016)   No facility-administered encounter medications on file as of 01/30/2016.     No Known Allergies   Immunization History  Administered Date(s) Administered  . Influenza Split 04/07/2012, 12/25/2012  . Influenza Whole 12/09/2007, 12/07/2008, 12/05/2009  . Influenza, High Dose Seasonal PF 10/31/2015  . Influenza, Seasonal, Injecte, Preservative Fre 03/15/2014  .  Influenza,inj,Quad PF,36+ Mos 12/28/2014  . Tdap 01/29/2011    Current Medications, Allergies, Past Medical History, Past Surgical History, Family History, and Social History were reviewed in [employer] record.   Review of Systems        See HPI - all other systems neg except as noted... The patient complains of decreased hearing, dyspnea on exertion, and difficulty walking.  The patient denies anorexia, fever, weight loss, weight gain, vision loss, hoarseness, chest pain, syncope, peripheral edema, prolonged cough, headaches, hemoptysis, abdominal pain, melena, hematochezia, severe indigestion/heartburn, hematuria, incontinence, muscle weakness, suspicious skin lesions, transient blindness, depression, unusual weight change, abnormal bleeding, enlarged lymph nodes, and angioedema.    Objective:   Physical Exam     WD, WN, 83 y/o WM in NAD... GENERAL:  Alert & oriented; pleasant & cooperative... HEENT:  [place]/AT, EOM-wnl, PERRLA, EACs-clear,  TMs-wnl, NOSE-clear, THROAT-clear & wnl. NECK:  Supple w/ fairROM; no JVD; normal carotid impulses w/ faint right bruit; no thyromegaly or nodules palpated; no lymphadenopathy. CHEST:  Clear to P & A; without wheezes/ rales/ or rhonchi heard... HEART:  Regular, gr 1/6 SEM without rubs or gallops heard... ABDOMEN:  Soft & nontender; normal bowel sounds; no organomegaly or masses detected. EXT: without deformities, mod arthritic changes; no varicose veins/ venous insuffic/ or edema. he has bilat fem art bruits & minor pressure area betw right 4th & 5th toes... NEURO:  CN's intact;  no focal neuro deficits... DERM:  Lesion on medial side of right small toe, sl tender...  RADIOLOGY DATA:  Reviewed in the EPIC EMR & discussed w/ the patient...  LABORATORY DATA:  Reviewed in the EPIC EMR & discussed w/ the patient...   Assessment & Plan:    HBP>  BP is OK on the current regimen; continue same; he is also followed by the [facility]...  ASHD/ CAD>  Followed by [name] for [facility] & the [facility]; continue current meds...  ASPVD- Carotid art dis & PAD>  Decr ABIs & Carotid dis> on ASA 81mg /d; followed by [name] et al...  CHOL>  On Lipitor20-taking 1/2 tab and FLP is at goals...  DM>  On diet alone & labs also followed by there [facility]; labs today showed BS~120 & prev A1c=6.3.Marland KitchenMarland Kitchen  Renal Insuffic>  Creat stable in the 2.3 range on current meds & he knows to avoid NSAIDs.Marland KitchenMarland Kitchen  Anemia>  Hg seems stable in the 10 range w/ ACD...   Patient's Medications  New Prescriptions  Previous Medications   ACETAMINOPHEN (TYLENOL) 500 MG TABLET    Take 500 mg by mouth every 6 (six) hours as needed.   AMLODIPINE (NORVASC) 10 MG TABLET    Take 10 mg by mouth daily.    ASPIRIN EC 81 MG TABLET    Take 81 mg by mouth every morning.    ATORVASTATIN (LIPITOR) 20 MG TABLET    Take 10 mg by mouth every evening.    CALCIUM CARBONATE-VITAMIN D (CALCIUM 600/VITAMIN D PO)    Take 1 tablet by mouth 2 (two) times daily.     FERROUS GLUCONATE (FERGON) 325 MG TABLET    Take 325 mg by mouth daily with breakfast.    FUROSEMIDE (LASIX) 20 MG TABLET    Take 20 mg by mouth daily.   GLUCOSE BLOOD (ACCU-CHEK AVIVA PLUS) TEST STRIP    CHECK BLOOD SUGAR TWICE A DAY.   LANCETS (ACCU-CHEK MULTICLIX) LANCETS    USE TWICE DAILY.   LEVOTHYROXINE (SYNTHROID, LEVOTHROID) 50 MCG TABLET  TAKE ONE TABLET BY MOUTH ONCE DAILY BEFORE BREAKFAST   LOSARTAN (COZAAR) 100 MG TABLET    Take 100 mg by mouth daily.   METOPROLOL TARTRATE (LOPRESSOR) 25 MG TABLET    Take 1 tablet (25 mg total) by mouth 2 (two) times daily.   MULTIPLE VITAMINS-MINERALS (CENTRUM SILVER PO)    Take 1 tablet by mouth every morning.    MULTIPLE VITAMINS-MINERALS (PRESERVISION AREDS 2 PO)    Take 1 tablet by mouth every evening.   PHENYLEPHRINE-APAP-GUAIFENESIN (MUCINEX SINUS-MAX) 10-650-400 MG/20ML LIQD    Take by mouth as needed.  Modified Medications   No medications on file  Discontinued Medications   FUROSEMIDE (LASIX) 40 MG TABLET    Take 1 tablet (40 mg total) by mouth daily.

## 2016-07-30 ENCOUNTER — Other Ambulatory Visit (INDEPENDENT_AMBULATORY_CARE_PROVIDER_SITE_OTHER): Payer: Medicare Other

## 2016-07-30 ENCOUNTER — Encounter: Payer: Self-pay | Admitting: Pulmonary Disease

## 2016-07-30 ENCOUNTER — Ambulatory Visit (INDEPENDENT_AMBULATORY_CARE_PROVIDER_SITE_OTHER): Payer: Medicare Other | Admitting: Pulmonary Disease

## 2016-07-30 VITALS — BP 142/64 | HR 54 | Ht 68.0 in | Wt 159.2 lb

## 2016-07-30 DIAGNOSIS — E038 Other specified hypothyroidism: Secondary | ICD-10-CM

## 2016-07-30 DIAGNOSIS — M159 Polyosteoarthritis, unspecified: Secondary | ICD-10-CM

## 2016-07-30 DIAGNOSIS — M150 Primary generalized (osteo)arthritis: Secondary | ICD-10-CM

## 2016-07-30 DIAGNOSIS — I70219 Atherosclerosis of native arteries of extremities with intermittent claudication, unspecified extremity: Secondary | ICD-10-CM | POA: Diagnosis not present

## 2016-07-30 DIAGNOSIS — I10 Essential (primary) hypertension: Secondary | ICD-10-CM

## 2016-07-30 DIAGNOSIS — E7800 Pure hypercholesterolemia, unspecified: Secondary | ICD-10-CM

## 2016-07-30 DIAGNOSIS — D638 Anemia in other chronic diseases classified elsewhere: Secondary | ICD-10-CM

## 2016-07-30 DIAGNOSIS — J8410 Pulmonary fibrosis, unspecified: Secondary | ICD-10-CM | POA: Diagnosis not present

## 2016-07-30 DIAGNOSIS — N183 Chronic kidney disease, stage 3 unspecified: Secondary | ICD-10-CM

## 2016-07-30 DIAGNOSIS — G319 Degenerative disease of nervous system, unspecified: Secondary | ICD-10-CM | POA: Diagnosis not present

## 2016-07-30 DIAGNOSIS — I25810 Atherosclerosis of coronary artery bypass graft(s) without angina pectoris: Secondary | ICD-10-CM

## 2016-07-30 DIAGNOSIS — E119 Type 2 diabetes mellitus without complications: Secondary | ICD-10-CM

## 2016-07-30 DIAGNOSIS — I6523 Occlusion and stenosis of bilateral carotid arteries: Secondary | ICD-10-CM | POA: Diagnosis not present

## 2016-07-30 LAB — CBC WITH DIFFERENTIAL/PLATELET
BASOS ABS: 0.1 10*3/uL (ref 0.0–0.1)
BASOS PCT: 1 % (ref 0.0–3.0)
EOS ABS: 0.4 10*3/uL (ref 0.0–0.7)
Eosinophils Relative: 5.5 % — ABNORMAL HIGH (ref 0.0–5.0)
HCT: 31.7 % — ABNORMAL LOW (ref 39.0–52.0)
HEMOGLOBIN: 10.7 g/dL — AB (ref 13.0–17.0)
LYMPHS PCT: 22.5 % (ref 12.0–46.0)
Lymphs Abs: 1.8 10*3/uL (ref 0.7–4.0)
MCHC: 33.9 g/dL (ref 30.0–36.0)
MCV: 87.2 fl (ref 78.0–100.0)
MONOS PCT: 16.2 % — AB (ref 3.0–12.0)
Monocytes Absolute: 1.3 10*3/uL — ABNORMAL HIGH (ref 0.1–1.0)
Neutro Abs: 4.3 10*3/uL (ref 1.4–7.7)
Neutrophils Relative %: 54.8 % (ref 43.0–77.0)
Platelets: 202 10*3/uL (ref 150.0–400.0)
RBC: 3.63 Mil/uL — ABNORMAL LOW (ref 4.22–5.81)
RDW: 14 % (ref 11.5–15.5)
WBC: 7.9 10*3/uL (ref 4.0–10.5)

## 2016-07-30 LAB — BASIC METABOLIC PANEL
BUN: 42 mg/dL — AB (ref 6–23)
CHLORIDE: 101 meq/L (ref 96–112)
CO2: 27 mEq/L (ref 19–32)
Calcium: 9.9 mg/dL (ref 8.4–10.5)
Creatinine, Ser: 2.54 mg/dL — ABNORMAL HIGH (ref 0.40–1.50)
GFR: 25.43 mL/min — ABNORMAL LOW (ref >60.00)
GLUCOSE: 114 mg/dL — AB (ref 70–99)
Potassium: 4.6 mEq/L (ref 3.5–5.1)
Sodium: 136 mEq/L (ref 135–145)

## 2016-07-30 NOTE — Patient Instructions (Signed)
Today we updated your med list in our EPIC system...    Continue your current medications the same...  Today we rechecked your blood count & blood chemistries...    We will contact you w/ the results when available...   We will arrange for your follow up appt w/ Cardiology-- they may need to update your Echocardiogram & Doppler studies...  Call for any questions...  Let's plan a follow up visit in 11mo, sooner if needed for problems.Marland KitchenMarland Kitchen

## 2016-07-30 NOTE — Progress Notes (Signed)
Subjective:    Patient ID: Henry [name], male    DOB: 05/06/1924, 83 y.o.   MRN: [id number]  HPI 83 y/o WM here for a follow up visit... he has mult med problems as listed and is followed by DrBBrodie=>DrMCoooper, and DrPerry as well... he gets his meds filled at the [facility] which makes for a complicated array of perscribed meds and substituted alternatives... ~  SEE PREV EPIC NOTES FOR OLDER DATA >>     CDoppler 1/13 showed mild heterogeneous plaque bilat w/ 40-59% bilat ICAstenoses...  CT/ MRI Brain 2/14 w/ atrophy & sm vessel dis  CXR 6/14 showed normal heart size, tortuous Ao, mild COPD changes w/ clear lungs/ NAD, DJD in Tspine...   LABS 6/14:  Chems- ok x BS=132 A1c=6.6 Creat=1.7;  CBC- Hg=11.3;  TSH=5.84;  B12=530;  BNP=198...  Lexiscan Myoview 08/2012:  NEG nuclear study- nl BP response, no CP, no signif ST changes, EF=61%, norm wall motion...   LABS 12/14:  FLP- at goals on Lip20;  Chems- ok x BS=131, A1c=6.9, Cr=1.8;  CBC= Hg= 10.0;  TSH=8.74 & rec to start Synthroid50...  LABS 7/15:  Chems- ok x BS=129, A1c=6.5, BUN=25, Cr=1.8;  CBC- ok x Hg=10.4, Fe=84 (27%sat), Ferritin=104;  TSH=3.50...  Art Dopplers 12/15 showed ABIs .79R & .71L, TBIs were abn but w/o risk of tissue loss  CXR 1/16 showed norm heart size, prior CABG, COPD w/ scarring at the bases, NAD...  LABS 1/16:  FLP- ok on Atorva20;  Chems- ok w/ stable Cr=1.5, BS=127;  CBC- mild chr anemia w/ Hg=10.1;  B12=408; TSH=9.10 off meds...   ~  August 31, 2014:  32mo ROV & Issak has had a good interval- his CC's are "tightness in my legs" esp w/ walking & he has known ASPVD w/ f/u [name] soon, and feeling sl better on the Synthroid50 regularly... We reviewed the following medical problems during today's office visit >>     HBP> on Metop25Bid, Amlod5, Losar100, Lasix20 (prn);  BP= 114/56 & he has persistent mild edema; denies CP, palpit, ch in SOB, etc...    ASHD> on ASA81; known CAD & s/p CABG 2000; followed by [name] & last  seen 12/15- norm Myoview 7/14; he denies CP/ angina, palpit, SOB; needs to take Lasix20 daily for the edema.    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    Periph Vasc Dis> on ASA81; he has stable claud w/ ambulation; last Art Dopplers 12/15 showed ABIs .79R & .71L, TBIs were abn but w/o risk of tissue loss; overdue for yearly f/u studies...    CHOL> on Lip20 (off prev Crestor); Labs 1/16 showed TChol 164, TG 151, HDL 36, LDL 98... Needs better diet.    DM> on diet alone (off prev Metform); Labs 1/16 showed BS= 127; Labs 7/16 showed BS=116, A1c=6.3; we reviewed diet, exercise, etc....    Renal Insuffic> he knows to avoid NSAIDs; Labs 12/14 w/ Creat= 1.8 and Labs 1/16 showed Cr= 1.5; Labs 7/16 w/ Cr=2.0 & rec to incr water intake...    DJD, LBP w/ spinal stenosis> on Tramadol50 prn; followed by DrRamos=> DrBrooks at [facility]; they did MRI w/ report of sp stenosis severe at L4-5; surg 7/14 w/ lumbar decompression & fusion L4-5...    Anemia> on FeSO4 325mg /d; Labs 1/16 w/ Hg= 10.1 stable; Labs  7/16 showed Hg=10, Fe=57 (20%sat), Ferritin=109; continue same... We reviewed prob list, meds, xrays and labs> see below for updates >>   LABS 7/16:  Chems- ok w/ BS=116, A1c=6.3, BUN=28, Cr=2.0;  CBC- anemic w/ Hg=10.0, Fe=57 (20%), Ferritin=109;  TSH=3.62... PLAN>>  Continue same meds, continue f/u [name], ROV w/ me in 46mo...  ~  March 23, 2015:  89mo ROV & Henry [name] returns for routine ROV- he tells me he recently fell on the ice & hurt left leg, ortho eval was neg for any fxs; otherw feeling well & he denies CP, palpit, SOB, edema; he denies cough, sput, hemoptysis, etc;  he had Cards f/u w/ [name] 12/2014> f/u HBP, CAD, remote CABG, HL, ASPVD w/ claudication, CRI w/ Cr~2> BNP reported [facility] at [facility] & he was given more Lasix  (40mg /d); f/u 2DEcho 02/08/15 showed mild LVH, norm LVF w/ EF=55-60%, no regional wall motion abn, Gr1DD, mod AoV calcif & mild AS/ trivAI, mild MR, mild LAdil, norm RVF & PAsys=29...    EXAM shows Afeb, VSS, O2sat=98% on RA;  HEENT- neg, mallampati2;  Chest- bibasilar crackles ~1/3rd way up w/o w/r;  Heart- RR gr1/6 SEM at apex & base, w/o r/g;  Abd- soft, neg;  Ext- left ankle swelling  EKG 12/2014 showed NSR, rate 63/min, Q in III, NSSTTWA, NAD...  2DEcho 02/08/15 showed mild LVH, norm LVF w/ EF=55-60%, no regional wall motion abn, Gr1DD, mod AoV calcif & mild AS/ trivAI, mild MR, mild LAdil, norm RVF & PAsys=29  CDopplers 03/22/15>  bilat heterogen plaque, 40-59% bilat ICAstenoses- stable; norm subclav & vertebrals...   CXR 03/23/15>  Heart at upper lim of norm & s/p CABG; underlying COPD w/ flatening of diaph & chr incr markings at bases; mild DDD in Tspine; NAD- s/p CABG & chronic changes...  LABS 03/23/15>  FLP- all parameters at goals on Lip10;  Chems- ok x BS=105, A1c=6.6, BUN=29, Cr=1.94, LFTs=wnl;  CBC- stable anemia w/ Hg=10.2, MCV=91; Fe=93 (30%sat), Ferritin=187, SPE/IEP- neg, no monoclonal prot... IMP/PLAN>>  Henry [name] is stable on his current regimen- continue same; reminded to be careful during the winter months; we plan ROV recheck in 35months...  ~  August 16, 2015:  2mo ROV & [name] is c/o increased fatigue & bilat leg tightness w/ exertion (we discussed checking Art dopplers and ABIs); he also notes that his balance is off but no falls (we discussed a Neuro eval & poss PT rx but he declines and says he'll think about it);  We reviewed the following medical problems during today's office visit >>     HBP> on Metop25Bid, Amlod5, Losar100, Lasix40;  BP= 130/64 & he has persistent mild edema; denies CP, palpit, ch in SOB, etc...    ASHD> on ASA81; known CAD & s/p CABG 2000; followed by [name] & last seen 01/24/15- norm Myoview 7/14; he denies CP/ angina, palpit, SOB; needs to take Lasix20 daily  for the edema.    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    Periph Vasc Dis> on ASA81; he has stable claud w/ ambulation; last Art Dopplers 12/15 showed ABIs .79R & .71L, TBIs were abn but w/o risk of tissue loss; now c/o increased leg symptoms- ArtDopplers pending...    CHOL> on Lip20; Labs 1/16 showed TChol 164, TG 151, HDL 36, LDL 98... Needs better diet.  DM> on diet alone (off prev Metform); Labs 1/16 showed BS= 127; Labs 7/16 showed BS=116, A1c=6.3; we reviewed diet, exercise, etc....    Hypothyroid> on Synthroid50    Renal Insuffic> he knows to avoid NSAIDs; Labs 12/14 w/ Creat= 1.8 and Labs 1/16 showed Cr= 1.5; Labs 7/16 w/ Cr=2.0 & rec to incr water intake...    DJD, LBP w/ spinal stenosis> on Tramadol50 prn; followed by DrRamos=> DrBrooks at [facility]; they did MRI w/ report of sp stenosis severe at L4-5; surg 7/14 w/ lumbar decompression & fusion L4-5...    Anemia> on FeSO4 325mg /d; Labs 1/16 w/ Hg= 10.1 stable; Labs 7/16 showed Hg=10, Fe=57 (20%sat), Ferritin=109; continue same... EXAM shows Afeb, VSS, O2sat=100% on RA;  HEENT- neg, mallampati2;  Chest- bibasilar crackles ~1/3rd way up w/o w/r;  Heart- RR gr1/6 SEM at apex & base, w/o r/g;  Abd- soft, neg;  Ext- left ankle swelling  LABS 08/16/15>  Chems- ok w/ K=5.3, BS=107, A1c=6.3, BUN=37, Cr=2.17;  CBC- Hg=10.4, otherw ok...   ArtDopplers LEs 08/24/15>  Known bilat SFA disease- R-ABI=0.83 (mild), L-ABI=0.79 (mod); TBIs are R-0.65 & L-0.52...  IMP/PLAN>>  Tricia is 83 y/o w/ multisys dis, known ASHD & PVD, continue same meds and try to incr exercise; we discussed low sodium, elev legs, support hose, continue Lasix40; he will maintain f/u w/ Cards- [name]...  ~  January 30, 2016:  35mo [place] reports doing satis- no new complaints or  concerns;  He is still caring for wife (5) w/ alz dis at home, they have 5 children- 2 in [place], 1 in [place], 1 in [place], and 1 daugh in [place] Nathen May)... We reviewed the following medical problems during today's office visit >> Note: he gets his meds from the [place].    HBP> on Metop25Bid, Amlod10, Losar100, Lasix20;  BP= 110/70 & he has intermittent mild edema; denies CP, palpit, ch in SOB, etc...    ASHD> on ASA81; known CAD & s/p CABG 2000; followed by [name] & last seen 01/24/15- norm Myoview 7/14; he denies CP/ angina, palpit, SOB; needs to take Lasix20 daily for the edema.    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    Periph Vasc Dis> on ASA81; he has stable claud w/ ambulation ("tight in legs"); Art Dopplers 12/15 showed ABIs .79R & .71L, TBIs were abn but w/o risk of tissue loss; f/u ArtDopplers 07/2015 were stable: .83R & .79L w/ similar TBIs    CHOL> on Lip20-1/2 tab/d; Labs 12/17 showed TChol 138, TG 73, HDL 48, LDL 76... Continue same...    DM> on diet alone (off prev Metform); Labs 12/17 showed BS= 120; Labs 7/16 showed BS=116, A1c=6.3; we reviewed diet, exercise, etc....    Hypothyroid> on Synthroid47mcg/d w/ Labs 12/17 showing TSH=2.78    Renal Insuffic> he knows to avoid NSAIDs; Labs 12/14 w/ Creat= 1.8 and Labs 1/16 showed Cr= 1.5; Labs 7/16 w/ Cr=2.0 & rec to incr water intake & most recent lab 12/17 showed Cr=2.36...    DJD, LBP w/ spinal stenosis> on Tramadol50 prn; followed by DrRamos=> DrBrooks at [facility]; they did MRI w/ report of sp stenosis severe at L4-5; surg 7/14 w/ lumbar decompression & fusion L4-5...    Anemia> on FeSO4 325mg /d, MVI & AREDS2; Labs 1/16 w/ Hg= 10.1 stable; Labs  7/16 showed Hg=10, Fe=57 (20%sat), Ferritin=109; most recent lab 12/17 showed Hg=  9.7.Marland KitchenMarland Kitchen EXAM shows Afeb, VSS, O2sat=100% on RA;  HEENT- neg, mallampati2;  Chest- bibasilar crackles ~1/3rd way up w/o w/r;  Heart- RR gr1/6 SEM at apex & base, w/o r/g;  Abd- soft, neg;  Ext- left ankle swelling  LABS 01/30/16>  FLP- all parameters at goals on Lip20-1/2 tab Qd;  Chems- ok x BS=120, BUN=42, Cr=2.36;  CBC- anemic w/ Hg=9.7;  TSH=2.78;  BNP=355  IMP/PLAN>>  Ozias is stable w/ multisystem dis, continue same meds and regular clinical follow up   ~  July 30, 2016:  42mo ROV & Habib relates incr stress at home caring for wife w/ alz; his legs remain stiff & get "tight" w/ exercise; he is managing remarkably well at 89 w/ multisystem disease... 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    HBP> on Metop25Bid, Amlod10, Losar100, Lasix20;  BP= 110/70 & he has intermittent mild edema; denies CP, palpit, ch in SOB, etc...    ASHD> on ASA81; known CAD & s/p CABG 2000; followed by [name] & last seen 01/24/15- norm Myoview 7/14; he denies CP/ angina, palpit, SOB; on Lasix20 daily for the edema => we will arrange for CARDS f/u appt.    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    Periph Vasc Dis> on ASA81; he has stable claud w/ ambulation ("tight in legs"); Art Dopplers 12/15 showed ABIs .79R & .71L, TBIs were abn but w/o risk of tissue loss; f/u ArtDopplers 07/2015 were stable: .83R & .79L w/ similar TBIs    CHOL> on Lip20-1/2 tab/d; Labs 12/17 showed TChol 138, TG 73, HDL 48, LDL 76... Continue same...    DM> on diet alone (off prev Metform); Labs 12/17 showed BS= 120; Labs 7/16 showed BS=116, A1c=6.3; we reviewed diet,  exercise, etc....    Hypothyroid> on Synthroid19mcg/d w/ Labs 12/17 showing TSH=2.78    Renal Insuffic> he knows to avoid NSAIDs; Labs 12/14 w/ Creat= 1.8 and Labs 7/16 w/ Cr=2.0 & rec to incr water intake => Labs 12/17 w/ Cr=2.36 & today Labs showed Cr=2.54 => we will refer to NEPHROLOGY for input.    DJD, LBP w/ spinal stenosis> prev on Tramadol50 prn; followed by DrRamos=> DrBrooks at [facility]; they did MRI w/ report of sp stenosis severe at L4-5; surg 7/14 w/ lumbar decompression & fusion L4-5...    Anemia> on FeSO4 325mg /d, MVI & AREDS2; Labs 1/16 w/ Hg= 10.1 stable; Labs 7/16 showed Hg=10, Fe=57 (20%sat), Ferritin=109; Lab 12/17 w/ Hg= 9.7, and Labs today showed Hg=10.7 c/w ACD-renal...    Anxiety> under stress caring for wife w/ Alz, fatigue, c/o legs feel tight... EXAM shows Afeb, VSS, O2sat=100% on RA;  HEENT- neg, mallampati2;  Chest- bibasilar crackles ~1/3rd way up w/o w/r;  Heart- RR gr1/6 SEM at apex & base, w/o r/g;  Abd- soft, neg;  Ext- left ankle tr swelling  CXR 03/07/16>  Coarse interstitial markings at bases (no change), no airsp dis, norm heart size, atherosclerotic Ao, prev CABG, DJD Tspine.  CDopplers 03/21/16>  bilat heterogen plaque, 1-39% bilat ICAstenoses (  improved from 2017)... Continue same meds.  Art Dopplers of LEs w/ ABIs> sched & pending 07/2016  LABS 07/30/16>  Chems- BS=114, BUN=42, Cr=2.54, K=4.6;  CBC- Hg=10.7, otherw ok (eos=5.5%)... He needs Nephrology referral w/ slow steady rise in Cr... IMP/PLAN>>  Kristeen Mans is stable w/ his severe dis at age 52, still caring for his wife w/ AlzDis;  His Creat continues to slowly rise despite BP control/ ARB rx/ no NSAIDs/ etc=> we will refer to Nephrology for their suggestions;  He has anemia of chr renal dis as well but appears stable at 10.7 today;  He is overdue for CARDS f/u w/ [name] but infact appears stable- recall that he gets meds from the [place] & is seen there Q59mo as well...          Problem List:  HYPERTENSION  (ICD-401.9) >>  ~  controlled on 4 meds:  METOPROLOL 25mg Bid, AMLODIPINE 10mg /d, LOSARTAN 25mg /d, and HCT 25mg - 1/2 qd...  ~  [place] showed mild LVH, AoV sclerosis & mild AI, EF= 60%... ~  labs 11/11 showed BUN=36, Creat=2.1, K=5.1.Marland KitchenMarland Kitchen rec to stop HCTZ... ~  [facility] subseq re-started 12.5mg  HCTZ in the interim due to edema... ~  6/14:  on Metop25/d, Amlod10, Losar25, Lasix20;  BP= 160/64 but he is in pain & ?took his meds today; he denies CP, palpit, ch in SOB, edema, etc; asked to incr Metop25Bid.  ~  CXR 6/14 showed norm heart size, atherosclerotic & tort Ao, emphysematous changes in upper lobes & scarring in lower lobes, DJD in spine, NAD... ~  12/14: on Metop25Bid, Amlod10, Losar25-3/d, Lasix20;  BP= 144/60 & he denies CP, palpit, ch in SOB, edema, etc. ~  7/15: BP controlled on Metop25Bid, Amlod10, Losar25-3/d, Lasix20;  BP= 120/74 7 he denies CP, palpit, dyspnea, etc; Labs showed K=4.7, BUN=25, Cr=1.8 stable. ~  1/16: on Metop25Bid, Amlod5, Losar100, Lasix20 (prn);  BP= 124/68 & he has persistent edema; denies CP, palpit, ch in SOB, etc. ~  7/16: on Metop25Bid, Amlod5, Losar100, Lasix20 (prn);  BP= 114/56 & he remains stable...  ARTERIOSCLEROTIC HEART DISEASE (ICD-414.00) - on ASA 81mg /d... long time pt of DrJoeLeBauer & followed by DrBrodie now... s/p CABG in 2000 by DrBartle (for L main dis)... he is active & w/o angina, palpit, change in SOB/ DOE, etc... ~  NuclearStressTest 9/08 was normal without ischemia and EF=68%... ~  12/13: he saw [name] for f/u CAD, s/p CABG, PAD, Carotid Art dis> no angina, bilat calf pain w/ ambulation but felt to be stable; [facility], but [facility]... ~  EKG 6/14 showed SBrady, rate56, 1st degree AVB, otherw wnl... ~  7/14:  Myoview showed normal wall motion & EF=61%, no CP or EKG changes... ~  7/15:  He has ASHD & ASPVD on ASA81; as noted he is active & denies angina etc; he saw [name] 12/14 & stable, continue medical therapy &  exercise. ~  1/16: on ASA81; known CAD & s/p CABG 2000; followed by [name] & last seen 12/15- norm Myoview 7/14; he denies CP/ angina, palpit, SOB; needs to take Lasix20 daily for the edema.  CAROTID ARTERY DISEASE>  PERIPHERAL VASCULAR DISEASE (ICD-443.9) - on ASA daily and he has bilat femoral bruits... he has seen DrDowney in the past (now [name]) w/ exerc program recommended... he walks regularly w/ stable claud symptoms, no sores or lesions on LE's... ~  ABI's 10/08 were normal bilat = 1.0.Marland KitchenMarland Kitchen waveforms were biphasic however (no change from 2005). ~  CDoppler's  10/08 showed mild bilat carotid dis w/ 40-59% RICA stenosis, and 6-43% LICA stenosis. ~  CDoppler's 10/09 showed the same= stable. ~  CDoppler's 11/10 showed stable mild carotid dis bilat> 32-95% RICA & 1-88% LICA stenoses. ~  ABI's 11/10 showed prob mod severe >50% distal right SFA stenosis, & mod dis in left SFA, ABI's=0.9 ~  Podiarty sent him to [facility] for ArtDopplers 9/11> ABIs 0.82 R & 0.74L w/ bilat SFA dis L>R, toe pressures adeq for healing... ~  6/14: on ASA81; he denies cerebral ischemic symptoms; CT/ MRI Brain 2/14 showed sm vessel dis, vasc calcif, global atrophy (no hemorrhage or large area infarct); last CDoppler 1/13 showed mild heterogeneous plaque bilat w/ 40-59% bilat ICAstenoses... ~  6/14: on ASA81; he has stable claud w/ ambulation but lim by back pain now; last Art Dopplers 1/13 showed ABIs .92R & .80L, TBIs were abn but w/o risk of tissue loss; overdue for yearly f/u studies of Carotids & ABIs... ~  September 25, 2022: He has ASHD & ASPVD on ASA81; as noted he is active & denies angina etc; he saw [name] 12/14 & stable, continue medical therapy & exercise. ~  CDoppler 1/16 showed mild heterogeneous plaque bilat w/ 40-59% bilat ICAstenoses & norm subclav/ antegrade verts.  HYPERCHOLESTEROLEMIA (ICD-272.0) - prev controlled on Simva40 + diet, [facility] CRESTOR 10mg /d. ~  [place] 2/09 on Simva40 showed Tchol 169, TG  206, HDL 37, LDL 96 ~  FLP 4/10 on Simva40 showed TChol 146, TG 145, HDL 36, LDL 82 ~  FLP 10/10 on Simva40 showed TChol 166, TG 130, HDL 38, LDL 102 ~  FLP 4/11 on Simva40 showed TChol 138, TG 87, HDL 40, LDL 80... [facility] subseq ch to Cres10 ~  FLP 11/11 on Cres10 showed TChol 147, TG 161, HDL 34, LDL 81 ~  6/12:  He indicates interval labs done by the [facility] but he didn't bring copies for [place] to review... ~  6/14: on Lip20 (off prev Crestor); Labs done at [facility] but he didn't bring reports; he is not fasting today for recheck.  ~  [place] 12/14 on Lip20 showed TChol 127, TG 56, HDL 41, LDL 75  ~  FLP 1/16 on Lip20 showed TChol 164, TG 151, HDL 36, LDL 98  DIABETES MELLITUS (ICD-250.00) - he's struggled w/ diet Rx... wt stable ~175 range... prev on Metformin 500Bid but [facility] changed to GLYBURIDE 5mg /d, then decr to 1/2 tab daily due to side effects & he stopped due to hypogly. ~  labs 10/08 showed FBS= 134, and HgA1c= 5.4... ~  labs 6/09 showed BS= 101, HgA1c= 6.4.Marland KitchenMarland Kitchen monofilament test is normal. ~  Eye eval 1/10 at [facility] showed no DM retinopathy... ~  labs 4/10 on MetformBid showed BS= 106, A1c= 6.8, Urine Microalb= sl incr @ 7.8mg % ~  labs 10/10 showed BS= 119, A1c= 6.5...  NOTE: 2/11 [facility] ch to Glybur5mg - 1/2 daily. ~  labs 4/11 on Glyb2.5 showed BS= 97, A1c= 6.6.Marland KitchenMarland Kitchen pt subseq stopped Glybur due to low sugars. ~  labs 11/11 off meds showed BS= 149, A1c= 6.7.Marland KitchenMarland Kitchen coninue diet Rx. ~  6/12:  He indicates interim labs done by the [facility] but he didn't bring copies for [place] to review... ~  6/14: on diet alone (off prev Metform); Labs 2/14 Hosp w/ BS= 150-200+ range; now BS=132, A1c=6.6; he follows low carb diet ~  Labs 12/14 on diet alone (weight up 13# to 173#) showed BS= 131, A1c= 6.9 ~  09/25/2022: He  has DM controlled on diet alone;weight stable at 171#, BMI=25-6; Labs today showed BS=129, A1c=6.5; continue diet management. ~  1/16: on diet alone (off prev Metform); Labs 12/14 showed BS= 131, A1c=6.9; Labs 1/16  showed BS= 127; we reviewed diet, exercise, etc ~  7/16:  On diet alone> Labs 7/16 showed BS=116, A1c=6.3; we reviewed diet, exercise, etc...  BORDERLINE TSH >> ~  Serial labs showed rising TSH> 3.57 => 5.84 => 8.74 (12/14) and we decided to start Synthroid45mcg/d;  [facility]how [facility] told [facility] & he did not call to check w/ [place]... ~  7/15  Follow up TSH= 3.50 not on meds, clinically euthyroid, good energy, etc;  we will follow closely...  ~  1/16:  Labs here 1/16 showed TSH= 9.10 & he is requested to restart the Levothy39mcg/d... ~  7/16: now on Levothy50 every day> Labs 7/16 showed TSH= 3.62, continue same.  RENAL INSUFFICIENCY (ICD-588.9) - Creat 1.8-2.0 in the past... ~  labs 6/09 showed BUN= 31, Creat= 1.6... ~  labs 9/09 showed BUN= 25, Creat= 1.6, K= 4.5 ~  labs 4/10 showed BUN= 26, Creat= 1.6, K= 5.0 ~  labs 10/10 showed BUN= 21, Creat= 1.6 ~  labs 4/11 showed BUN= 30, Creat= 1.9..Marland Kitchen advised to stay well hydrated. ~  labs 11/11 showed BUN= 36, Creat= 2.1.Marland KitchenMarland Kitchen rec to stop the HCTZ ~  6/12:  He indicates that the [facility] restarted HCTZ 12.5mg /d & they are following labs... ~  6/14: he knows to avoid NSAIDs; Labs 2/14 w/ Creat= 1.4-1.6 range; Labs today showed BUN= 21, Creat= 1.7... ~  Labs 12/14 showed BUN= 23, Creat= 1.8 ~  Labs 7/15 showed BUN= 25, Cr= 1.8 ~  Labs 1/16 showed BUN= 25, Cr= 1.5 ~  Labs 7/16 showed BUN= 28, Cr= 2.0  DEGENERATIVE JOINT DISEASE (ICD-715.90) LOW BACK PAIN >> he has severe sp stenosis L4-5 per MRI at [facility] 6/14... ~  Notes and Records fro [facility]- DrRamos, DrBrooks> reviewed, they plan surgery if cleared fro CV standpoint...  Hx of ANEMIA OF CHRONIC DISEASE (ICD-285.29) - eval by DrGranfortuna w/ ACD secondary to his renal insuffic and Rx w/ aranesp periodically... nothing else found on his extensive eval... he was taking Ferrous Gluconate daily (it no longer makes him tired)... ~  labs 2/09 w/ Hg= 11.2.Marland Kitchen. ~  labs 6/09 w/ Hg= 11.0.Marland Kitchen.  rec> OTC Fe prep but he never got it! ~  labs 9/09 showed Hg= 10.5, Fe= 81 ~  labs 4/10 showed Hg= 10.5, MCV= 92, Fe= 77 (22%sat), B12= 590, Folate>20... rec> OTC Fe daily. ~  labs 10/10 showed Hg= 10.3, Fe= 88 ~  labs 4/11 showed Hg= 10.9,  Fe= 58... continue Ferrous Gluc daily w/ VitC ~  labs 11/11 showed Hg= 11.6 ~  6/12:  Again he indicated that the [facility] is following his labs, aske to bring copies for [place] to review. ~  6/14: on FeSO4 325mg /d; Labs 2/14 w/ Hg= 10-11 range; Labs now show Hg= 11.3 ~  Labs 12/14 showed Hg= 10.0 ~  7/15: He has a chronic anemia & f/u labs today showed Hg=10.4 (stable) w/ MCV=91, Fe=84 (27%), Ferritin 104; he continues on MVI, Fe, etc. ~  1/16: on FeSO4 325mg /d; Labs 1/16 w/ Hg= 10.1 stable. ~  7/16: on Fe daily> Labs showed Hg=10, Fe=57 (20%sat), Ferritin=109, continue same...   Past Surgical History:  Procedure Laterality Date  . cataract surgery    . COLONOSCOPY  Hx: of  . CORONARY ARTERY BYPASS GRAFT  2000   by Dr. Cyndia Bent  . LUMBAR LAMINECTOMY/DECOMPRESSION MICRODISCECTOMY N/A 09/10/2012   Procedure: LUMBAR DECOMPRESSION,  IN SITU FUSION LUMBAR 4-5;  Surgeon: Melina Schools, MD;  Location: [facility];  Service: Orthopedics;  Laterality: N/A;  . TONSILLECTOMY      Outpatient Encounter Prescriptions as of 07/30/2016  Medication Sig  . amLODipine (NORVASC) 10 MG tablet Take 10 mg by mouth daily.   Marland Kitchen aspirin EC 81 MG tablet Take 81 mg by mouth every morning.   Marland Kitchen atorvastatin (LIPITOR) 20 MG tablet Take 10 mg by mouth every evening.   . Calcium Carbonate-Vitamin D (CALCIUM 600/VITAMIN D PO) Take 1 tablet by mouth 2 (two) times daily.   . ferrous gluconate (FERGON) 325 MG tablet Take 325 mg by mouth 2 (two) times daily.   . furosemide (LASIX) 40 MG tablet Take 40 mg by mouth daily.  Marland Kitchen glucose blood (ACCU-CHEK AVIVA PLUS) test strip CHECK BLOOD SUGAR TWICE A DAY.  Marland Kitchen Lancets (ACCU-CHEK MULTICLIX) lancets USE TWICE DAILY.  Marland Kitchen levothyroxine (SYNTHROID,  LEVOTHROID) 50 MCG tablet TAKE ONE TABLET BY MOUTH ONCE DAILY BEFORE BREAKFAST  . losartan (COZAAR) 100 MG tablet Take 100 mg by mouth daily.  . metoprolol tartrate (LOPRESSOR) 25 MG tablet Take 1 tablet (25 mg total) by mouth 2 (two) times daily.  . Multiple Vitamins-Minerals (CENTRUM SILVER PO) Take 1 tablet by mouth every morning.   . Multiple Vitamins-Minerals (PRESERVISION AREDS 2 PO) Take 1 tablet by mouth every evening.  . [DISCONTINUED] furosemide (LASIX) 20 MG tablet Take 20 mg by mouth daily.  . [DISCONTINUED] acetaminophen (TYLENOL) 500 MG tablet Take 500 mg by mouth every 6 (six) hours as needed.  . [DISCONTINUED] Phenylephrine-APAP-Guaifenesin ([place]anna SINUS-MAX) 10-650-400 MG/20ML LIQD Take by mouth as needed.  . [DISCONTINUED] predniSONE (DELTASONE) 10 MG tablet Take  4 each am x 2 days,   2 each am x 2 days,  1 each am x 2 days and stop (Patient not taking: Reported on 07/30/2016)   No facility-administered encounter medications on file as of 07/30/2016.     No Known Allergies   Immunization History  Administered Date(s) Administered  . Influenza Split 04/07/2012, 12/25/2012  . Influenza Whole 12/09/2007, 12/07/2008, 12/05/2009  . Influenza, High Dose Seasonal PF 10/31/2015  . Influenza, Seasonal, Injecte, Preservative Fre 03/15/2014  . Influenza,inj,Quad PF,36+ Mos 12/28/2014  . Tdap 01/29/2011    Current Medications, Allergies, Past Medical History, Past Surgical History, Family History, and Social History were reviewed in [employer] record.   Review of Systems        See HPI - all other systems neg except as noted... The patient complains of decreased hearing, dyspnea on exertion, and difficulty walking.  The patient denies anorexia, fever, weight loss, weight gain, vision loss, hoarseness, chest pain, syncope, peripheral edema, prolonged cough, headaches, hemoptysis, abdominal pain, melena, hematochezia, severe indigestion/heartburn, hematuria,  incontinence, muscle weakness, suspicious skin lesions, transient blindness, depression, unusual weight change, abnormal bleeding, enlarged lymph nodes, and angioedema.    Objective:   Physical Exam     WD, WN, 83 y/o WM in NAD... GENERAL:  Alert & oriented; pleasant & cooperative... HEENT:  [place]/AT, EOM-wnl, PERRLA, EACs-clear, TMs-wnl, NOSE-clear, THROAT-clear & wnl. NECK:  Supple w/ fairROM; no JVD; normal carotid impulses w/ faint right bruit; no thyromegaly or nodules palpated; no lymphadenopathy. CHEST:  Clear to P & A; without wheezes/ rales/ or rhonchi heard... HEART:  Regular, gr 1/6  SEM without rubs or gallops heard... ABDOMEN:  Soft & nontender; normal bowel sounds; no organomegaly or masses detected. EXT: without deformities, mod arthritic changes; no varicose veins/ venous insuffic/ or edema. he has bilat fem art bruits & minor pressure area betw right 4th & 5th toes... NEURO:  CN's intact;  no focal neuro deficits... DERM:  Lesion on medial side of right small toe, sl tender...  RADIOLOGY DATA:  Reviewed in the EPIC EMR & discussed w/ the patient...  LABORATORY DATA:  Reviewed in the EPIC EMR & discussed w/ the patient...   Assessment & Plan:    HBP>  BP is OK on the current regimen; continue same; he is also followed by the [facility]...  ASHD/ CAD>  Followed by [name] for [facility]; continue current meds...  ASPVD- Carotid art dis & PAD>  Decr ABIs & Carotid dis> on ASA 81mg /d; followed by [name] et al...  CHOL>  On Lipitor20-taking 1/2 tab and FLP is at goals...  DM>  On diet alone & labs also followed by there [facility]; labs today showed BS~120 & prev A1c=6.3.Marland KitchenMarland Kitchen  Renal Insuffic>  Creat stable in the 2.3 range on current meds & he knows to avoid NSAIDs.Marland KitchenMarland Kitchen  Anemia>  Hg seems stable in the 10 range w/ ACD...   Patient's Medications  New Prescriptions   No medications on file  Previous Medications   AMLODIPINE (NORVASC) 10 MG TABLET    Take 10 mg by  mouth daily.    ASPIRIN EC 81 MG TABLET    Take 81 mg by mouth every morning.    ATORVASTATIN (LIPITOR) 20 MG TABLET    Take 10 mg by mouth every evening.    CALCIUM CARBONATE-VITAMIN D (CALCIUM 600/VITAMIN D PO)    Take 1 tablet by mouth 2 (two) times daily.    FERROUS GLUCONATE (FERGON) 325 MG TABLET    Take 325 mg by mouth 2 (two) times daily.    FUROSEMIDE (LASIX) 40 MG TABLET    Take 40 mg by mouth daily.   GLUCOSE BLOOD (ACCU-CHEK AVIVA PLUS) TEST STRIP    CHECK BLOOD SUGAR TWICE A DAY.   LANCETS (ACCU-CHEK MULTICLIX) LANCETS    USE TWICE DAILY.   LEVOTHYROXINE (SYNTHROID, LEVOTHROID) 50 MCG TABLET    TAKE ONE TABLET BY MOUTH ONCE DAILY BEFORE BREAKFAST   LOSARTAN (COZAAR) 100 MG TABLET    Take 100 mg by mouth daily.   METOPROLOL TARTRATE (LOPRESSOR) 25 MG TABLET    Take 1 tablet (25 mg total) by mouth 2 (two) times daily.   MULTIPLE VITAMINS-MINERALS (CENTRUM SILVER PO)    Take 1 tablet by mouth every morning.    MULTIPLE VITAMINS-MINERALS (PRESERVISION AREDS 2 PO)    Take 1 tablet by mouth every evening.  Modified Medications   No medications on file  Discontinued Medications   ACETAMINOPHEN (TYLENOL) 500 MG TABLET    Take 500 mg by mouth every 6 (six) hours as needed.   FUROSEMIDE (LASIX) 20 MG TABLET    Take 20 mg by mouth daily.   PHENYLEPHRINE-APAP-GUAIFENESIN (MUCINEX SINUS-MAX) 10-650-400 MG/20ML LIQD    Take by mouth as needed.   PREDNISONE (DELTASONE) 10 MG TABLET    Take  4 each am x 2 days,   2 each am x 2 days,  1 each am x 2 days and stop

## 2016-07-31 DIAGNOSIS — J8410 Pulmonary fibrosis, unspecified: Secondary | ICD-10-CM | POA: Insufficient documentation

## 2016-08-03 NOTE — Addendum Note (Signed)
Addended by: Mignon Pine on: 08/03/2016 08:16 AM   Modules accepted: Orders

## 2016-08-13 ENCOUNTER — Ambulatory Visit: Payer: Medicare Other | Admitting: Cardiovascular Disease

## 2016-09-06 ENCOUNTER — Other Ambulatory Visit: Payer: Self-pay | Admitting: Nephrology

## 2016-09-06 DIAGNOSIS — D631 Anemia in chronic kidney disease: Secondary | ICD-10-CM | POA: Diagnosis not present

## 2016-09-06 DIAGNOSIS — E1129 Type 2 diabetes mellitus with other diabetic kidney complication: Secondary | ICD-10-CM | POA: Diagnosis not present

## 2016-09-06 DIAGNOSIS — N183 Chronic kidney disease, stage 3 unspecified: Secondary | ICD-10-CM

## 2016-09-06 DIAGNOSIS — I129 Hypertensive chronic kidney disease with stage 1 through stage 4 chronic kidney disease, or unspecified chronic kidney disease: Secondary | ICD-10-CM | POA: Diagnosis not present

## 2016-09-06 DIAGNOSIS — N179 Acute kidney failure, unspecified: Secondary | ICD-10-CM | POA: Diagnosis not present

## 2016-09-06 DIAGNOSIS — R809 Proteinuria, unspecified: Secondary | ICD-10-CM | POA: Diagnosis not present

## 2016-09-06 DIAGNOSIS — N184 Chronic kidney disease, stage 4 (severe): Secondary | ICD-10-CM | POA: Diagnosis not present

## 2016-09-06 DIAGNOSIS — I739 Peripheral vascular disease, unspecified: Secondary | ICD-10-CM | POA: Diagnosis not present

## 2016-09-12 ENCOUNTER — Other Ambulatory Visit: Payer: Medicare Other

## 2016-09-12 DIAGNOSIS — H35372 Puckering of macula, left eye: Secondary | ICD-10-CM | POA: Diagnosis not present

## 2016-09-12 DIAGNOSIS — E119 Type 2 diabetes mellitus without complications: Secondary | ICD-10-CM | POA: Diagnosis not present

## 2016-09-12 DIAGNOSIS — Z961 Presence of intraocular lens: Secondary | ICD-10-CM | POA: Diagnosis not present

## 2016-09-12 DIAGNOSIS — H353132 Nonexudative age-related macular degeneration, bilateral, intermediate dry stage: Secondary | ICD-10-CM | POA: Diagnosis not present

## 2016-09-13 ENCOUNTER — Ambulatory Visit
Admission: RE | Admit: 2016-09-13 | Discharge: 2016-09-13 | Disposition: A | Discharge status: Home / Self Care | Payer: Medicare Other | Source: Ambulatory Visit | Attending: Nephrology | Admitting: Nephrology

## 2016-09-13 DIAGNOSIS — N183 Chronic kidney disease, stage 3 unspecified: Secondary | ICD-10-CM

## 2016-09-13 DIAGNOSIS — N281 Cyst of kidney, acquired: Secondary | ICD-10-CM | POA: Diagnosis not present

## 2016-10-25 ENCOUNTER — Encounter: Payer: Self-pay | Admitting: Cardiovascular Disease

## 2016-10-25 ENCOUNTER — Ambulatory Visit (INDEPENDENT_AMBULATORY_CARE_PROVIDER_SITE_OTHER): Payer: Medicare Other | Admitting: Cardiovascular Disease

## 2016-10-25 VITALS — BP 164/52 | HR 61 | Ht 69.0 in | Wt 159.0 lb

## 2016-10-25 DIAGNOSIS — I739 Peripheral vascular disease, unspecified: Secondary | ICD-10-CM | POA: Diagnosis not present

## 2016-10-25 DIAGNOSIS — I2510 Atherosclerotic heart disease of native coronary artery without angina pectoris: Secondary | ICD-10-CM | POA: Diagnosis not present

## 2016-10-25 DIAGNOSIS — I6523 Occlusion and stenosis of bilateral carotid arteries: Secondary | ICD-10-CM

## 2016-10-25 DIAGNOSIS — I350 Nonrheumatic aortic (valve) stenosis: Secondary | ICD-10-CM

## 2016-10-25 NOTE — Progress Notes (Signed)
Cardiology Office Note Date:  10/25/2016   ID:  Henry [name], DOB 26-Feb-1925, MRN [id number]  PCP:  Noralee Space, MD  Cardiologist:  Sherren Mocha, MD    Chief Complaint  Patient presents with  . Follow-up    tightness in legs     History of Present Illness: Henry [name] is a 83 y.o. male who presents for follow-up of CAD with remote CABG. The patient is also followed for peripheral arterial disease with intermittent claudication, hypertension with chronic kidney disease, and hyperlipidemia.  The patient brings in recent lab work dated 10/23/2016 demonstrating a creatinine of 2.7 mg/dL, glucose 97, BUN 51, potassium 4.8, sodium 140, hemoglobin 9.1, platelet count 172,000. Values are stable from previous labs.  He denies any recent chest pain or pressure. Denies shortness of breath. He does complain of ankle edema. Denies orthopnea or PND. He feels discomfort in both lower legs with ambulation. Even sometimes with working around the house. Symptoms are worse than they were one year ago. He has no resting pain. He denies any ulceration or nonhealing wound on the foot.  Past Medical History:  Diagnosis Date  . CAD of autologous bypass graft   . Carotid artery disease ([place])   . Diabetes mellitus without complication ([place])   . DJD (degenerative joint disease)   . History of anemia of chronic disease   . Hypercholesteremia   . Hypertension   . PVD (peripheral vascular disease) with claudication ([place])   . Renal insufficiency     Past Surgical History:  Procedure Laterality Date  . cataract surgery    . COLONOSCOPY     Hx: of  . CORONARY ARTERY BYPASS GRAFT  2000   by Dr. Cyndia Bent  . LUMBAR LAMINECTOMY/DECOMPRESSION MICRODISCECTOMY N/A 09/10/2012   Procedure: LUMBAR DECOMPRESSION,  IN SITU FUSION LUMBAR 4-5;  Surgeon: Melina Schools, MD;  Location: [facility];  Service: Orthopedics;  Laterality: N/A;  . TONSILLECTOMY      Current Outpatient Prescriptions  Medication Sig  Dispense Refill  . amLODipine (NORVASC) 10 MG tablet Take 10 mg by mouth daily.     Marland Kitchen aspirin EC 81 MG tablet Take 81 mg by mouth every morning.     Marland Kitchen atorvastatin (LIPITOR) 20 MG tablet Take 10 mg by mouth every evening.     . Calcium Carbonate-Vitamin D (CALCIUM 600/VITAMIN D PO) Take 1 tablet by mouth 2 (two) times daily.     . ferrous gluconate (FERGON) 325 MG tablet Take 325 mg by mouth 2 (two) times daily.     . furosemide (LASIX) 40 MG tablet Take 40 mg by mouth daily.    Marland Kitchen glucose blood (ACCU-CHEK AVIVA PLUS) test strip CHECK BLOOD SUGAR TWICE A DAY. 50 each 5  . Lancets (ACCU-CHEK MULTICLIX) lancets USE TWICE DAILY. 102 each 5  . levothyroxine (SYNTHROID, LEVOTHROID) 50 MCG tablet TAKE ONE TABLET BY MOUTH ONCE DAILY BEFORE BREAKFAST 90 tablet 3  . losartan (COZAAR) 100 MG tablet Take 100 mg by mouth daily.    . metoprolol tartrate (LOPRESSOR) 25 MG tablet Take 1 tablet (25 mg total) by mouth 2 (two) times daily. 60 tablet 5  . Multiple Vitamins-Minerals (CENTRUM SILVER PO) Take 1 tablet by mouth every morning.     . Multiple Vitamins-Minerals (PRESERVISION AREDS 2 PO) Take 1 tablet by mouth every evening.     No current facility-administered medications for this visit.     Allergies:   Patient has no known allergies.   Social  History:  The patient  reports that he quit smoking about 63 years ago. His smoking use included Cigarettes. He has a 10.00 pack-year smoking history. He has never used smokeless tobacco. He reports that he drinks alcohol. He reports that he does not use drugs.   Family History:  The patient's family history is not on file.    ROS:  Please see the history of present illness.  Otherwise, review of systems is positive for Leg swelling, muscle pain, leg pain.  All other systems are reviewed and negative.    PHYSICAL EXAM: VS:  BP (!) 164/52   Pulse 61   Ht 5\' 9"  (1.753 m)   Wt 159 lb (72.1 kg)   BMI 23.48 kg/m  , BMI Body mass index is 23.48 kg/m. GEN:  Well nourished, well developed, pleasant elderly gentleman in no acute distress  HEENT: normal  Neck: no JVD, no masses. No carotid bruits Cardiac: RRR with 2/6 systolic murmur at the RUSB       Respiratory:  clear to auscultation bilaterally, normal work of breathing GI: soft, nontender, nondistended, + BS MS: no deformity or atrophy  Ext: 1+ bilateral pretibial edema, pedal pulses nonpalpable Skin: warm and dry, no rash Neuro:  Strength and sensation are intact Psych: euthymic mood, full affect  EKG:  EKG is ordered today. The ekg ordered today shows normal sinus rhythm 61 bpm, first creating AV block, possible age-indeterminate inferior infarct.  Recent Labs: 01/30/2016: ALT 13; Pro B Natriuretic peptide (BNP) 355.0; TSH 2.78 07/30/2016: BUN 42; Creatinine, Ser 2.54; Hemoglobin 10.7; Platelets 202.0; Potassium 4.6; Sodium 136   Lipid Panel     Component Value Date/Time   CHOL 138 01/30/2016 1204   TRIG 73.0 01/30/2016 1204   HDL 47.50 01/30/2016 1204   CHOLHDL 3 01/30/2016 1204   VLDL 14.6 01/30/2016 1204   LDLCALC 76 01/30/2016 1204   LDLDIRECT 96.2 04/04/2007 1049      Wt Readings from Last 3 Encounters:  10/25/16 159 lb (72.1 kg)  07/30/16 159 lb 3.2 oz (72.2 kg)  03/07/16 160 lb 6.4 oz (72.8 kg)     Cardiac Studies Reviewed: 2D Echo 02-08-2015: Study Conclusions  - Left ventricle: The cavity size was normal. Wall thickness was   increased in a pattern of mild LVH. Systolic function was normal.   The estimated ejection fraction was in the range of 55% to 60%.   Wall motion was normal; there were no regional wall motion   abnormalities. Doppler parameters are consistent with abnormal   left ventricular relaxation (grade 1 diastolic dysfunction). - Aortic valve: Trileaflet; moderately calcified leaflets. There   was mild stenosis. There was trivial regurgitation. Mean gradient   (S): 16 mm Hg. - Mitral valve: There was mild regurgitation. - Left atrium: The atrium  was mildly dilated. - Right ventricle: The cavity size was normal. Systolic function   was normal. - Pulmonary arteries: PA peak pressure: 29 mm Hg (S). - Inferior vena cava: The vessel was normal in size. The   respirophasic diameter changes were in the normal range (>= 50%),   consistent with normal central venous pressure.  Impressions:  - Normal LV size with mild LV hypertrophy. EF 55-60%. Mild aortic   stenosis. Mild mitral regurgitation. Normal RV size and systolic   function.  ASSESSMENT AND PLAN: 1.  Lower extremity peripheral arterial disease with intermittent claudication: The patient's ABIs are reviewed and have been in the range of 0.7 bilaterally. He has had no  recent studies. With progressive symptoms I have recommended repeat ABIs and lower extremity arterial studies.  2. Coronary artery disease, native vessel: No angina. Patient with remote CABG. Will continue with his current medical program which includes aspirin, a beta blocker, and a statin drug.  3. 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 He will continue on losartan, metoprolol, furosemide, and amlodipine.  4. Hyperlipidemia: The patient is treated with atorvastatin.  5. Chronic diastolic heart failure: The patient appears stable with no exertional shortness of breath at this time. No evidence of volume overload on his exam. Medications as outlined above. He has responded very well to a daily loop diuretic.  6. Mild aortic stenosis: Some increase in his murmur on exam today. Suspect mild to moderate aortic stenosis. Recommend an updated echocardiogram.  Current medicines are reviewed with the patient today.  The patient does not have concerns regarding medicines.  Labs/ tests ordered today include:  No orders of the defined types were placed in this encounter.   Disposition:   FU one year  Signed, Sherren Mocha, MD    10/25/2016 10:45 AM    [address], [place], [place]  [address] Phone: [phone]; Fax: [phone]

## 2016-10-25 NOTE — Patient Instructions (Signed)
Medication Instructions:  Your physician recommends that you continue on your current medications as directed. Please refer to the Current Medication list given to you today.   Labwork: none  Testing/Procedures: Your physician has requested that you have an echocardiogram. Echocardiography is a painless test that uses sound waves to create images of your heart. It provides your doctor with information about the size and shape of your heart and how well your heart's chambers and valves are working. This procedure takes approximately one hour. There are no restrictions for this procedure.  Your physician has requested that you have a lower or upper extremity arterial duplex. This test is an ultrasound of the arteries in the legs or arms. It looks at arterial blood flow in the legs and arms. Allow one hour for Lower and Upper Arterial scans. There are no restrictions or special instructions   Follow-Up: Your physician wants you to follow-up in: 12 months.  You will receive a reminder letter in the mail two months in advance. If you don't receive a letter, please call our office to schedule the follow-up appointment.   Any Other Special Instructions Will Be Listed Below (If Applicable).     If you need a refill on your cardiac medications before your next appointment, please call your pharmacy.

## 2016-10-30 ENCOUNTER — Other Ambulatory Visit: Payer: Self-pay | Admitting: Cardiovascular Disease

## 2016-10-30 DIAGNOSIS — I739 Peripheral vascular disease, unspecified: Secondary | ICD-10-CM

## 2016-10-31 ENCOUNTER — Ambulatory Visit (HOSPITAL_COMMUNITY): Payer: Medicare Other | Attending: Cardiology

## 2016-10-31 ENCOUNTER — Other Ambulatory Visit: Payer: Self-pay

## 2016-10-31 DIAGNOSIS — I350 Nonrheumatic aortic (valve) stenosis: Secondary | ICD-10-CM | POA: Diagnosis not present

## 2016-10-31 DIAGNOSIS — E1151 Type 2 diabetes mellitus with diabetic peripheral angiopathy without gangrene: Secondary | ICD-10-CM | POA: Diagnosis not present

## 2016-10-31 DIAGNOSIS — Z87891 Personal history of nicotine dependence: Secondary | ICD-10-CM | POA: Diagnosis not present

## 2016-10-31 DIAGNOSIS — R609 Edema, unspecified: Secondary | ICD-10-CM | POA: Insufficient documentation

## 2016-10-31 DIAGNOSIS — E1122 Type 2 diabetes mellitus with diabetic chronic kidney disease: Secondary | ICD-10-CM | POA: Insufficient documentation

## 2016-10-31 DIAGNOSIS — N189 Chronic kidney disease, unspecified: Secondary | ICD-10-CM | POA: Insufficient documentation

## 2016-10-31 DIAGNOSIS — I340 Nonrheumatic mitral (valve) insufficiency: Secondary | ICD-10-CM | POA: Insufficient documentation

## 2016-10-31 DIAGNOSIS — I129 Hypertensive chronic kidney disease with stage 1 through stage 4 chronic kidney disease, or unspecified chronic kidney disease: Secondary | ICD-10-CM | POA: Insufficient documentation

## 2016-10-31 DIAGNOSIS — I358 Other nonrheumatic aortic valve disorders: Secondary | ICD-10-CM | POA: Diagnosis not present

## 2016-10-31 DIAGNOSIS — I2510 Atherosclerotic heart disease of native coronary artery without angina pectoris: Secondary | ICD-10-CM | POA: Insufficient documentation

## 2016-10-31 DIAGNOSIS — E785 Hyperlipidemia, unspecified: Secondary | ICD-10-CM | POA: Insufficient documentation

## 2016-11-01 ENCOUNTER — Ambulatory Visit (HOSPITAL_COMMUNITY)
Admission: RE | Admit: 2016-11-01 | Discharge: 2016-11-01 | Disposition: A | Discharge status: Home / Self Care | Payer: Medicare Other | Source: Ambulatory Visit | Attending: Internal Medicine | Admitting: Internal Medicine

## 2016-11-01 ENCOUNTER — Telehealth: Payer: Self-pay | Admitting: *Deleted

## 2016-11-01 DIAGNOSIS — E1151 Type 2 diabetes mellitus with diabetic peripheral angiopathy without gangrene: Secondary | ICD-10-CM | POA: Diagnosis not present

## 2016-11-01 DIAGNOSIS — I771 Stricture of artery: Secondary | ICD-10-CM | POA: Diagnosis not present

## 2016-11-01 DIAGNOSIS — R938 Abnormal findings on diagnostic imaging of other specified body structures: Secondary | ICD-10-CM | POA: Diagnosis not present

## 2016-11-01 DIAGNOSIS — E785 Hyperlipidemia, unspecified: Secondary | ICD-10-CM | POA: Diagnosis not present

## 2016-11-01 DIAGNOSIS — I10 Essential (primary) hypertension: Secondary | ICD-10-CM | POA: Insufficient documentation

## 2016-11-01 DIAGNOSIS — I739 Peripheral vascular disease, unspecified: Secondary | ICD-10-CM

## 2016-11-01 DIAGNOSIS — I2510 Atherosclerotic heart disease of native coronary artery without angina pectoris: Secondary | ICD-10-CM | POA: Insufficient documentation

## 2016-11-01 DIAGNOSIS — Z951 Presence of aortocoronary bypass graft: Secondary | ICD-10-CM | POA: Insufficient documentation

## 2016-11-01 DIAGNOSIS — Z87891 Personal history of nicotine dependence: Secondary | ICD-10-CM | POA: Insufficient documentation

## 2016-11-01 NOTE — Telephone Encounter (Signed)
-----   Message from Sherren Mocha, MD sent at 10/31/2016  9:46 PM EDT ----- Mild-moderate aortic stenosis. Preserved LV function. Recommend continued medical therapy and repeat echo in one year. thx

## 2016-11-01 NOTE — Telephone Encounter (Signed)
Pt has been notified of echo results/findings by phone with verbal understanding. Pt asked for a copy of results to be mailed to him as well. I have verified pt address. Pt is agreeable to repeat echo in 1 year.

## 2016-11-02 ENCOUNTER — Telehealth: Payer: Self-pay

## 2016-11-02 DIAGNOSIS — I350 Nonrheumatic aortic (valve) stenosis: Secondary | ICD-10-CM

## 2016-11-02 NOTE — Telephone Encounter (Signed)
ECHO ordered to be scheduled in 1 year.

## 2016-11-02 NOTE — Telephone Encounter (Signed)
-----   Message from Sherren Mocha, MD sent at 10/31/2016  9:46 PM EDT ----- Mild-moderate aortic stenosis. Preserved LV function. Recommend continued medical therapy and repeat echo in one year. thx

## 2016-11-08 DIAGNOSIS — N184 Chronic kidney disease, stage 4 (severe): Secondary | ICD-10-CM | POA: Diagnosis not present

## 2016-11-08 DIAGNOSIS — E1129 Type 2 diabetes mellitus with other diabetic kidney complication: Secondary | ICD-10-CM | POA: Diagnosis not present

## 2016-11-08 DIAGNOSIS — I739 Peripheral vascular disease, unspecified: Secondary | ICD-10-CM | POA: Diagnosis not present

## 2016-11-08 DIAGNOSIS — R809 Proteinuria, unspecified: Secondary | ICD-10-CM | POA: Diagnosis not present

## 2016-11-08 DIAGNOSIS — I129 Hypertensive chronic kidney disease with stage 1 through stage 4 chronic kidney disease, or unspecified chronic kidney disease: Secondary | ICD-10-CM | POA: Diagnosis not present

## 2016-11-08 DIAGNOSIS — D631 Anemia in chronic kidney disease: Secondary | ICD-10-CM | POA: Diagnosis not present

## 2016-11-15 ENCOUNTER — Ambulatory Visit: Payer: Medicare Other | Admitting: Cardiovascular Disease

## 2016-12-25 ENCOUNTER — Ambulatory Visit (INDEPENDENT_AMBULATORY_CARE_PROVIDER_SITE_OTHER): Payer: Medicare Other | Admitting: Cardiovascular Disease

## 2016-12-25 VITALS — BP 136/58 | HR 66 | Ht 69.0 in | Wt 159.0 lb

## 2016-12-25 DIAGNOSIS — I10 Essential (primary) hypertension: Secondary | ICD-10-CM

## 2016-12-25 DIAGNOSIS — I2510 Atherosclerotic heart disease of native coronary artery without angina pectoris: Secondary | ICD-10-CM | POA: Diagnosis not present

## 2016-12-25 DIAGNOSIS — I739 Peripheral vascular disease, unspecified: Secondary | ICD-10-CM | POA: Diagnosis not present

## 2016-12-25 DIAGNOSIS — I6523 Occlusion and stenosis of bilateral carotid arteries: Secondary | ICD-10-CM

## 2016-12-25 DIAGNOSIS — I350 Nonrheumatic aortic (valve) stenosis: Secondary | ICD-10-CM

## 2016-12-25 MED ORDER — CILOSTAZOL 50 MG PO TABS: 50.0000 mg | ORAL_TABLET | Freq: Two times a day (BID) | ORAL | 3 refills | Status: DC

## 2016-12-25 NOTE — Patient Instructions (Signed)
Medication Instructions:  START- Pletal(Cilostazol) 50 mg twice a day  If you need a refill on your cardiac medications before your next appointment, please call your pharmacy.  Labwork: None Ordered   Testing/Procedures: Your physician has requested that you have an aorta Illiac duplex.    Follow-Up: Your physician wants you to follow-up in: 3 Months.   Thank you for choosing [facility] at [facility]!!

## 2016-12-25 NOTE — Progress Notes (Signed)
Cardiology Office Note   Date:  12/25/2016   ID:  CARLESTER KASPAREK, DOB Nov 16, 1924, MRN [id number]  PCP:  Noralee Space, MD  Cardiologist:  Dr. Burt Knack  No chief complaint on file.     History of Present Illness: Henry [name] is a 83 y.o. male who was referred by Dr. Burt Knack for evaluation and management of peripheral arterial disease.  The patient has known history of coronary artery disease with remote CABG, peripheral arterial disease with chronic claudication, moderate aortic stenosis, essential hypertension, hyperlipidemia and chronic kidney disease.  His creatinine is around 2.5.  Has been doing reasonably well from a cardiac standpoint with no chest pain or shortness of breath. However, he recently reported worsening claudication affecting both calves worse on the right side than the left side.  This happens after walking about half a block and it forces him to stop and rest for a few minutes before he can resume.  He has no rest pain or lower extremity ulceration. He underwent recent noninvasive vascular evaluation which showed a drop in his ABI on the left side to the moderate range with evidence of distal left SFA occlusion.    Past Medical History:  Diagnosis Date  . CAD of autologous bypass graft   . Carotid artery disease ([place])   . Diabetes mellitus without complication ([place])   . DJD (degenerative joint disease)   . History of anemia of chronic disease   . Hypercholesteremia   . Hypertension   . PVD (peripheral vascular disease) with claudication ([place])   . Renal insufficiency     Past Surgical History:  Procedure Laterality Date  . cataract surgery    . COLONOSCOPY     Hx: of  . CORONARY ARTERY BYPASS GRAFT  2000   by Dr. Cyndia Bent  . LUMBAR LAMINECTOMY/DECOMPRESSION MICRODISCECTOMY N/A 09/10/2012   Procedure: LUMBAR DECOMPRESSION,  IN SITU FUSION LUMBAR 4-5;  Surgeon: Melina Schools, MD;  Location: [facility];  Service: Orthopedics;  Laterality: N/A;  .  TONSILLECTOMY       Current Outpatient Prescriptions  Medication Sig Dispense Refill  . amLODipine (NORVASC) 10 MG tablet Take 10 mg by mouth daily.     Marland Kitchen aspirin EC 81 MG tablet Take 81 mg by mouth every morning.     Marland Kitchen atorvastatin (LIPITOR) 20 MG tablet Take 10 mg by mouth every evening.     . Calcium Carbonate-Vitamin D (CALCIUM 600/VITAMIN D PO) Take 1 tablet by mouth 2 (two) times daily.     . ferrous gluconate (FERGON) 325 MG tablet Take 325 mg by mouth 2 (two) times daily.     . furosemide (LASIX) 40 MG tablet Take 40 mg by mouth daily.    Marland Kitchen glucose blood (ACCU-CHEK AVIVA PLUS) test strip CHECK BLOOD SUGAR TWICE A DAY. 50 each 5  . Lancets (ACCU-CHEK MULTICLIX) lancets USE TWICE DAILY. 102 each 5  . levothyroxine (SYNTHROID, LEVOTHROID) 50 MCG tablet TAKE ONE TABLET BY MOUTH ONCE DAILY BEFORE BREAKFAST 90 tablet 3  . losartan (COZAAR) 100 MG tablet Take 100 mg by mouth daily.    . metoprolol tartrate (LOPRESSOR) 25 MG tablet Take 1 tablet (25 mg total) by mouth 2 (two) times daily. 60 tablet 5  . Multiple Vitamins-Minerals (CENTRUM SILVER PO) Take 1 tablet by mouth every morning.     . Multiple Vitamins-Minerals (PRESERVISION AREDS 2 PO) Take 1 tablet by mouth every evening.     No current facility-administered medications for this visit.  Allergies:   Patient has no known allergies.    Social History:  The patient  reports that he quit smoking about 63 years ago. His smoking use included Cigarettes. He has a 10.00 pack-year smoking history. He has never used smokeless tobacco. He reports that he drinks alcohol. He reports that he does not use drugs.   Family History:  The patient's family history is negative for coronary artery disease.   ROS:  Please see the history of present illness.   Otherwise, review of systems are positive for none.   All other systems are reviewed and negative.    PHYSICAL EXAM: VS:  BP (!) 136/58   Pulse 66   Ht 5\' 9"  (1.753 m)   Wt 159 lb  (72.1 kg)   BMI 23.48 kg/m  , BMI Body mass index is 23.48 kg/m. GEN: Well nourished, well developed, in no acute distress  HEENT: normal  Neck: no JVD, carotid bruits, or masses Cardiac: RRR; no  rubs, or gallops,no edema .  3 out of 6 crescendo decrescendo systolic murmur in the aortic area which is mid peaking Respiratory:  clear to auscultation bilaterally, normal work of breathing GI: soft, nontender, nondistended, + BS MS: no deformity or atrophy  Skin: warm and dry, no rash Neuro:  Strength and sensation are intact Psych: euthymic mood, full affect Vascular: Femoral pulses +1 on the right and +2 on the left.  Distal pulses are nonpalpable.  EKG:  EKG is not ordered today.   Recent Labs: 01/30/2016: ALT 13; Pro B Natriuretic peptide (BNP) 355.0; TSH 2.78 07/30/2016: BUN 42; Creatinine, Ser 2.54; Hemoglobin 10.7; Platelets 202.0; Potassium 4.6; Sodium 136    Lipid Panel    Component Value Date/Time   CHOL 138 01/30/2016 1204   TRIG 73.0 01/30/2016 1204   HDL 47.50 01/30/2016 1204   CHOLHDL 3 01/30/2016 1204   VLDL 14.6 01/30/2016 1204   LDLCALC 76 01/30/2016 1204   LDLDIRECT 96.2 04/04/2007 1049      Wt Readings from Last 3 Encounters:  12/25/16 159 lb (72.1 kg)  10/25/16 159 lb (72.1 kg)  07/30/16 159 lb 3.2 oz (72.2 kg)       No flowsheet data found.    ASSESSMENT AND PLAN:  1.  Peripheral arterial disease with severe bilateral claudication worse on the right side.  Interestingly, his ABI was more reduced on the left side.  However, he reports that his right leg claudication is worse than the left leg claudication and by physical exam his right femoral pulse is diminished compared to the left.  This is suggestive of significant inflow disease on the right side which is the likely culprit for his right leg claudication. He has no evidence of critical limb ischemia.  I discussed with him management options of claudication.  He is extremely worried about the risk  of contrast-induced nephropathy which is understandable.  Also given his age, the risk of complication is higher than the average although he continues to be physically independent and very active.  I elected to start him on small dose cilostazol and I advised him to continue with walking.  I am going to obtain an aortoiliac duplex to see if he has approachable disease percutaneously.  Follow-up in 3 months.  2.  Coronary artery disease without angina: Continue medical therapy.  3.  Essential hypertension: Blood pressure is controlled.  4.  Aortic stenosis: Stable on recent echocardiogram.    Disposition:   FU with me in 3  months  Signed,  Kathlyn Sacramento, MD  12/25/2016 11:33 AM    [place]

## 2017-01-18 ENCOUNTER — Other Ambulatory Visit: Payer: Self-pay | Admitting: Pulmonary Disease

## 2017-01-18 MED ORDER — LEVOTHYROXINE SODIUM 50 MCG PO TABS: ORAL_TABLET | ORAL | 3 refills | Status: DC

## 2017-01-22 ENCOUNTER — Ambulatory Visit (INDEPENDENT_AMBULATORY_CARE_PROVIDER_SITE_OTHER)
Admission: RE | Admit: 2017-01-22 | Discharge: 2017-01-22 | Disposition: A | Discharge status: Home / Self Care | Payer: Medicare Other | Source: Ambulatory Visit | Attending: Pulmonary Disease | Admitting: Pulmonary Disease

## 2017-01-22 ENCOUNTER — Ambulatory Visit (INDEPENDENT_AMBULATORY_CARE_PROVIDER_SITE_OTHER): Payer: Medicare Other | Admitting: Pulmonary Disease

## 2017-01-22 ENCOUNTER — Other Ambulatory Visit (INDEPENDENT_AMBULATORY_CARE_PROVIDER_SITE_OTHER): Payer: Medicare Other

## 2017-01-22 ENCOUNTER — Encounter: Payer: Self-pay | Admitting: Pulmonary Disease

## 2017-01-22 VITALS — BP 138/58 | HR 64 | Ht 69.0 in | Wt 155.6 lb

## 2017-01-22 DIAGNOSIS — N183 Chronic kidney disease, stage 3 unspecified: Secondary | ICD-10-CM

## 2017-01-22 DIAGNOSIS — J8410 Pulmonary fibrosis, unspecified: Secondary | ICD-10-CM

## 2017-01-22 DIAGNOSIS — D638 Anemia in other chronic diseases classified elsewhere: Secondary | ICD-10-CM

## 2017-01-22 DIAGNOSIS — M150 Primary generalized (osteo)arthritis: Secondary | ICD-10-CM

## 2017-01-22 DIAGNOSIS — M159 Polyosteoarthritis, unspecified: Secondary | ICD-10-CM

## 2017-01-22 DIAGNOSIS — E039 Hypothyroidism, unspecified: Secondary | ICD-10-CM

## 2017-01-22 DIAGNOSIS — E7800 Pure hypercholesterolemia, unspecified: Secondary | ICD-10-CM

## 2017-01-22 DIAGNOSIS — I350 Nonrheumatic aortic (valve) stenosis: Secondary | ICD-10-CM | POA: Diagnosis not present

## 2017-01-22 DIAGNOSIS — I25810 Atherosclerosis of coronary artery bypass graft(s) without angina pectoris: Secondary | ICD-10-CM

## 2017-01-22 DIAGNOSIS — I10 Essential (primary) hypertension: Secondary | ICD-10-CM

## 2017-01-22 DIAGNOSIS — I6523 Occlusion and stenosis of bilateral carotid arteries: Secondary | ICD-10-CM

## 2017-01-22 DIAGNOSIS — I739 Peripheral vascular disease, unspecified: Secondary | ICD-10-CM

## 2017-01-22 DIAGNOSIS — E119 Type 2 diabetes mellitus without complications: Secondary | ICD-10-CM

## 2017-01-22 DIAGNOSIS — G319 Degenerative disease of nervous system, unspecified: Secondary | ICD-10-CM

## 2017-01-22 DIAGNOSIS — R918 Other nonspecific abnormal finding of lung field: Secondary | ICD-10-CM | POA: Diagnosis not present

## 2017-01-22 LAB — CBC WITH DIFFERENTIAL/PLATELET
Basophils Absolute: 0.1 10*3/uL (ref 0.0–0.1)
Basophils Relative: 1 % (ref 0.0–3.0)
EOS PCT: 6.2 % — AB (ref 0.0–5.0)
Eosinophils Absolute: 0.5 10*3/uL (ref 0.0–0.7)
HEMATOCRIT: 32.8 % — AB (ref 39.0–52.0)
HEMOGLOBIN: 11.1 g/dL — AB (ref 13.0–17.0)
LYMPHS PCT: 19.3 % (ref 12.0–46.0)
Lymphs Abs: 1.6 10*3/uL (ref 0.7–4.0)
MCHC: 33.7 g/dL (ref 30.0–36.0)
MCV: 88.6 fl (ref 78.0–100.0)
MONO ABS: 1.2 10*3/uL — AB (ref 0.1–1.0)
Monocytes Relative: 14.4 % — ABNORMAL HIGH (ref 3.0–12.0)
Neutro Abs: 4.9 10*3/uL (ref 1.4–7.7)
Neutrophils Relative %: 59.1 % (ref 43.0–77.0)
Platelets: 226 10*3/uL (ref 150.0–400.0)
RBC: 3.7 Mil/uL — AB (ref 4.22–5.81)
RDW: 13.3 % (ref 11.5–15.5)
WBC: 8.2 10*3/uL (ref 4.0–10.5)

## 2017-01-22 LAB — TSH: TSH: 5.16 u[IU]/mL — AB (ref 0.35–4.50)

## 2017-01-22 LAB — LIPID PANEL
CHOLESTEROL: 164 mg/dL (ref 0–200)
HDL: 45.2 mg/dL (ref >39.00)
LDL CALC: 97 mg/dL (ref 0–99)
NONHDL: 118.99
Total CHOL/HDL Ratio: 4
Triglycerides: 110 mg/dL (ref 0.0–149.0)
VLDL: 22 mg/dL (ref 0.0–40.0)

## 2017-01-22 LAB — COMPREHENSIVE METABOLIC PANEL
ALT: 11 U/L (ref 0–53)
AST: 17 U/L (ref 0–37)
Albumin: 4.4 g/dL (ref 3.5–5.2)
Alkaline Phosphatase: 80 U/L (ref 39–117)
BUN: 58 mg/dL — ABNORMAL HIGH (ref 6–23)
CALCIUM: 9.9 mg/dL (ref 8.4–10.5)
CO2: 25 meq/L (ref 19–32)
Chloride: 103 mEq/L (ref 96–112)
Creatinine, Ser: 2.68 mg/dL — ABNORMAL HIGH (ref 0.40–1.50)
GFR: 23.88 mL/min — AB (ref >60.00)
Glucose, Bld: 113 mg/dL — ABNORMAL HIGH (ref 70–99)
Potassium: 4.7 mEq/L (ref 3.5–5.1)
Sodium: 138 mEq/L (ref 135–145)
Total Bilirubin: 0.6 mg/dL (ref 0.2–1.2)
Total Protein: 7.8 g/dL (ref 6.0–8.3)

## 2017-01-22 LAB — VITAMIN D 25 HYDROXY (VIT D DEFICIENCY, FRACTURES): VITD: 35.09 ng/mL (ref 30.00–100.00)

## 2017-01-22 LAB — HEMOGLOBIN A1C: Hgb A1c MFr Bld: 6.6 % — ABNORMAL HIGH (ref 4.6–6.5)

## 2017-01-22 NOTE — Progress Notes (Signed)
Subjective:    Patient ID: Henry [name], male    DOB: November 11, 1924, 83 y.o.   MRN: [id number]  HPI 83 y/o WM here for a follow up visit... he has mult med problems as listed and is followed by DrBBrodie=>DrMCoooper, and DrPerry as well... he gets his meds filled at the [facility] which makes for a complicated array of perscribed meds and substituted alternatives... ~  SEE PREV EPIC NOTES FOR OLDER DATA >>     CDoppler 1/13 showed mild heterogeneous plaque bilat w/ 40-59% bilat ICAstenoses...  CT/ MRI Brain 2/14 w/ atrophy & sm vessel dis  CXR 6/14 showed normal heart size, tortuous Ao, mild COPD changes w/ clear lungs/ NAD, DJD in Tspine...   LABS 6/14:  Chems- ok x BS=132 A1c=6.6 Creat=1.7;  CBC- Hg=11.3;  TSH=5.84;  B12=530;  BNP=198...  Lexiscan Myoview 08/2012:  NEG nuclear study- nl BP response, no CP, no signif ST changes, EF=61%, norm wall motion...   LABS 12/14:  FLP- at goals on Lip20;  Chems- ok x BS=131, A1c=6.9, Cr=1.8;  CBC= Hg= 10.0;  TSH=8.74 & rec to start Synthroid50...  LABS 7/15:  Chems- ok x BS=129, A1c=6.5, BUN=25, Cr=1.8;  CBC- ok x Hg=10.4, Fe=84 (27%sat), Ferritin=104;  TSH=3.50...  Art Dopplers 12/15 showed ABIs .79R & .71L, TBIs were abn but w/o risk of tissue loss  CXR 1/16 showed norm heart size, prior CABG, COPD w/ scarring at the bases, NAD...  LABS 1/16:  FLP- ok on Atorva20;  Chems- ok w/ stable Cr=1.5, BS=127;  CBC- mild chr anemia w/ Hg=10.1;  B12=408; TSH=9.10 off meds...   ~  August 31, 2014:  46mo ROV & Evo has had a good interval- his CC's are "tightness in my legs" esp w/ walking & he has known ASPVD w/ f/u [name] soon, and feeling sl better on the Synthroid50 regularly... We reviewed the following medical problems during today's office visit >>     HBP> on Metop25Bid, Amlod5, Losar100, Lasix20 (prn);  BP= 114/56 & he has persistent mild edema; denies CP, palpit, ch in SOB, etc...    ASHD> on ASA81; known CAD & s/p CABG 2000; followed by [name] & last  seen 12/15- norm Myoview 7/14; he denies CP/ angina, palpit, SOB; needs to take Lasix20 daily for the edema.    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    Periph Vasc Dis> on ASA81; he has stable claud w/ ambulation; last Art Dopplers 12/15 showed ABIs .79R & .71L, TBIs were abn but w/o risk of tissue loss; overdue for yearly f/u studies...    CHOL> on Lip20 (off prev Crestor); Labs 1/16 showed TChol 164, TG 151, HDL 36, LDL 98... Needs better diet.    DM> on diet alone (off prev Metform); Labs 1/16 showed BS= 127; Labs 7/16 showed BS=116, A1c=6.3; we reviewed diet, exercise, etc....    Renal Insuffic> he knows to avoid NSAIDs; Labs 12/14 w/ Creat= 1.8 and Labs 1/16 showed Cr= 1.5; Labs 7/16 w/ Cr=2.0 & rec to incr water intake...    DJD, LBP w/ spinal stenosis> on Tramadol50 prn; followed by DrRamos=> DrBrooks at [facility]; they did MRI w/ report of sp stenosis severe at L4-5; surg 7/14 w/ lumbar decompression & fusion L4-5...    Anemia> on FeSO4 325mg /d; Labs 1/16 w/ Hg= 10.1 stable; Labs  7/16 showed Hg=10, Fe=57 (20%sat), Ferritin=109; continue same... We reviewed prob list, meds, xrays and labs> see below for updates >>   LABS 7/16:  Chems- ok w/ BS=116, A1c=6.3, BUN=28, Cr=2.0;  CBC- anemic w/ Hg=10.0, Fe=57 (20%), Ferritin=109;  TSH=3.62... PLAN>>  Continue same meds, continue f/u [name], ROV w/ me in 68mo...  ~  March 23, 2015:  9mo ROV & [name] returns for routine ROV- he tells me he recently fell on the ice & hurt left leg, ortho eval was neg for any fxs; otherw feeling well & he denies CP, palpit, SOB, edema; he denies cough, sput, hemoptysis, etc;  he had Cards f/u w/ [name] 12/2014> f/u HBP, CAD, remote CABG, HL, ASPVD w/ claudication, CRI w/ Cr~2> BNP reported elev at [facility] & he was given more Lasix  (40mg /d); f/u 2DEcho 02/08/15 showed mild LVH, norm LVF w/ EF=55-60%, no regional wall motion abn, Gr1DD, mod AoV calcif & mild AS/ trivAI, mild MR, mild LAdil, norm RVF & PAsys=29...    EXAM shows Afeb, VSS, O2sat=98% on RA;  HEENT- neg, mallampati2;  Chest- bibasilar crackles ~1/3rd way up w/o w/r;  Heart- RR gr1/6 SEM at apex & base, w/o r/g;  Abd- soft, neg;  Ext- left ankle swelling  EKG 12/2014 showed NSR, rate 63/min, Q in III, NSSTTWA, NAD...  2DEcho 02/08/15 showed mild LVH, norm LVF w/ EF=55-60%, no regional wall motion abn, Gr1DD, mod AoV calcif & mild AS/ trivAI, mild MR, mild LAdil, norm RVF & PAsys=29  CDopplers 03/22/15>  bilat heterogen plaque, 40-59% bilat ICAstenoses- stable; norm subclav & vertebrals...   CXR 03/23/15>  Heart at upper lim of norm & s/p CABG; underlying COPD w/ flatening of diaph & chr incr markings at bases; mild DDD in Tspine; NAD- s/p CABG & chronic changes...  LABS 03/23/15>  FLP- all parameters at goals on Lip10;  Chems- ok x BS=105, A1c=6.6, BUN=29, Cr=1.94, LFTs=wnl;  CBC- stable anemia w/ Hg=10.2, MCV=91; Fe=93 (30%sat), Ferritin=187, SPE/IEP- neg, no monoclonal prot... IMP/PLAN>>  Khaleed is stable on his current regimen- continue same; reminded to be careful during the winter months; we plan ROV recheck in 45months...  ~  August 16, 2015:  1mo ROV & Drayven is c/o increased fatigue & bilat leg tightness w/ exertion (we discussed checking Art dopplers and ABIs); he also notes that his balance is off but no falls (we discussed a Neuro eval & poss PT rx but he declines and says he'll think about it);  We reviewed the following medical problems during today's office visit >>     HBP> on Metop25Bid, Amlod5, Losar100, Lasix40;  BP= 130/64 & he has persistent mild edema; denies CP, palpit, ch in SOB, etc...    ASHD> on ASA81; known CAD & s/p CABG 2000; followed by [name] & last seen 01/24/15- norm Myoview 7/14; he denies CP/ angina, palpit, SOB; needs to take Lasix20 daily  for the edema.    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    Periph Vasc Dis> on ASA81; he has stable claud w/ ambulation; last Art Dopplers 12/15 showed ABIs .79R & .71L, TBIs were abn but w/o risk of tissue loss; now c/o increased leg symptoms- ArtDopplers pending...    CHOL> on Lip20; Labs 1/16 showed TChol 164, TG 151, HDL 36, LDL 98... Needs better diet.  DM> on diet alone (off prev Metform); Labs 1/16 showed BS= 127; Labs 7/16 showed BS=116, A1c=6.3; we reviewed diet, exercise, etc....    Hypothyroid> on Synthroid50    Renal Insuffic> he knows to avoid NSAIDs; Labs 12/14 w/ Creat= 1.8 and Labs 1/16 showed Cr= 1.5; Labs 7/16 w/ Cr=2.0 & rec to incr water intake...    DJD, LBP w/ spinal stenosis> on Tramadol50 prn; followed by DrRamos=> DrBrooks at [facility]; they did MRI w/ report of sp stenosis severe at L4-5; surg 7/14 w/ lumbar decompression & fusion L4-5...    Anemia> on FeSO4 325mg /d; Labs 1/16 w/ Hg= 10.1 stable; Labs 7/16 showed Hg=10, Fe=57 (20%sat), Ferritin=109; continue same... EXAM shows Afeb, VSS, O2sat=100% on RA;  HEENT- neg, mallampati2;  Chest- bibasilar crackles ~1/3rd way up w/o w/r;  Heart- RR gr1/6 SEM at apex & base, w/o r/g;  Abd- soft, neg;  Ext- left ankle swelling  LABS 08/16/15>  Chems- ok w/ K=5.3, BS=107, A1c=6.3, BUN=37, Cr=2.17;  CBC- Hg=10.4, otherw ok...   ArtDopplers LEs 08/24/15>  Known bilat SFA disease- R-ABI=0.83 (mild), L-ABI=0.79 (mod); TBIs are R-0.65 & L-0.52...  IMP/PLAN>>  Tricia is 83 y/o w/ multisys dis, known ASHD & PVD, continue same meds and try to incr exercise; we discussed low sodium, elev legs, support hose, continue Lasix40; he will maintain f/u w/ Cards- [name]...  ~  January 30, 2016:  35mo [place] reports doing satis- no new complaints or  concerns;  He is still caring for wife (5) w/ alz dis at home, they have 5 children- 2 in [place], 1 in [place], 1 in [place], and 1 daugh in [place] Nathen May)... We reviewed the following medical problems during today's office visit >> Note: he gets his meds from the [place].    HBP> on Metop25Bid, Amlod10, Losar100, Lasix20;  BP= 110/70 & he has intermittent mild edema; denies CP, palpit, ch in SOB, etc...    ASHD> on ASA81; known CAD & s/p CABG 2000; followed by [name] & last seen 01/24/15- norm Myoview 7/14; he denies CP/ angina, palpit, SOB; needs to take Lasix20 daily for the edema.    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    Periph Vasc Dis> on ASA81; he has stable claud w/ ambulation ("tight in legs"); Art Dopplers 12/15 showed ABIs .79R & .71L, TBIs were abn but w/o risk of tissue loss; f/u ArtDopplers 07/2015 were stable: .83R & .79L w/ similar TBIs    CHOL> on Lip20-1/2 tab/d; Labs 12/17 showed TChol 138, TG 73, HDL 48, LDL 76... Continue same...    DM> on diet alone (off prev Metform); Labs 12/17 showed BS= 120; Labs 7/16 showed BS=116, A1c=6.3; we reviewed diet, exercise, etc....    Hypothyroid> on Synthroid47mcg/d w/ Labs 12/17 showing TSH=2.78    Renal Insuffic> he knows to avoid NSAIDs; Labs 12/14 w/ Creat= 1.8 and Labs 1/16 showed Cr= 1.5; Labs 7/16 w/ Cr=2.0 & rec to incr water intake & most recent lab 12/17 showed Cr=2.36...    DJD, LBP w/ spinal stenosis> on Tramadol50 prn; followed by DrRamos=> DrBrooks at [facility]; they did MRI w/ report of sp stenosis severe at L4-5; surg 7/14 w/ lumbar decompression & fusion L4-5...    Anemia> on FeSO4 325mg /d, MVI & AREDS2; Labs 1/16 w/ Hg= 10.1 stable; Labs  7/16 showed Hg=10, Fe=57 (20%sat), Ferritin=109; most recent lab 12/17 showed Hg=  9.7.Marland KitchenMarland Kitchen EXAM shows Afeb, VSS, O2sat=100% on RA;  HEENT- neg, mallampati2;  Chest- bibasilar crackles ~1/3rd way up w/o w/r;  Heart- RR gr1/6 SEM at apex & base, w/o r/g;  Abd- soft, neg;  Ext- left ankle swelling  LABS 01/30/16>  FLP- all parameters at goals on Lip20-1/2 tab Qd;  Chems- ok x BS=120, BUN=42, Cr=2.36;  CBC- anemic w/ Hg=9.7;  TSH=2.78;  BNP=355  IMP/PLAN>>  Dmoni is stable w/ multisystem dis, continue same meds and regular clinical follow up  ~  July 30, 2016:  65mo ROV & Kamarian relates incr stress at home caring for wife w/ alz; his legs remain stiff & get "tight" w/ exercise; he is managing remarkably well at 89 w/ multisystem disease... 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    HBP> on Metop25Bid, Amlod10, Losar100, Lasix20;  BP= 110/70 & he has intermittent mild edema; denies CP, palpit, ch in SOB, etc...    ASHD> on ASA81; known CAD & s/p CABG 2000; followed by [name] & last seen 01/24/15- norm Myoview 7/14; he denies CP/ angina, palpit, SOB; on Lasix20 daily for the edema => we will arrange for CARDS f/u appt.    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    Periph Vasc Dis> on ASA81; he has stable claud w/ ambulation ("tight in legs"); Art Dopplers 12/15 showed ABIs .79R & .71L, TBIs were abn but w/o risk of tissue loss; f/u ArtDopplers 07/2015 were stable: .83R & .79L w/ similar TBIs    CHOL> on Lip20-1/2 tab/d; Labs 12/17 showed TChol 138, TG 73, HDL 48, LDL 76... Continue same...    DM> on diet alone (off prev Metform); Labs 12/17 showed BS= 120; Labs 7/16 showed BS=116, A1c=6.3; we reviewed diet,  exercise, etc....    Hypothyroid> on Synthroid79mcg/d w/ Labs 12/17 showing TSH=2.78    Renal Insuffic> he knows to avoid NSAIDs; Labs 12/14 w/ Creat= 1.8 and Labs 7/16 w/ Cr=2.0 & rec to incr water intake => Labs 12/17 w/ Cr=2.36 & today Labs showed Cr=2.54 => we will refer to NEPHROLOGY for input.    DJD, LBP w/ spinal stenosis> prev on Tramadol50 prn; followed by DrRamos=> DrBrooks at [facility]; they did MRI w/ report of sp stenosis severe at L4-5; surg 7/14 w/ lumbar decompression & fusion L4-5...    Anemia> on FeSO4 325mg /d, MVI & AREDS2; Labs 1/16 w/ Hg= 10.1 stable; Labs 7/16 showed Hg=10, Fe=57 (20%sat), Ferritin=109; Lab 12/17 w/ Hg= 9.7, and Labs today showed Hg=10.7 c/w ACD-renal...    Anxiety> under stress caring for wife w/ Alz, fatigue, c/o legs feel tight... EXAM shows Afeb, VSS, O2sat=100% on RA;  HEENT- neg, mallampati2;  Chest- bibasilar crackles ~1/3rd way up w/o w/r;  Heart- RR gr1/6 SEM at apex & base, w/o r/g;  Abd- soft, neg;  Ext- left ankle tr swelling  CXR 03/07/16>  Coarse interstitial markings at bases (no change), no airsp dis, norm heart size, atherosclerotic Ao, prev CABG, DJD Tspine.  CDopplers 03/21/16>  bilat heterogen plaque, 1-39% bilat ICAstenoses (improved  from 2017)... Continue same meds.  Art Dopplers of LEs w/ ABIs> sched & pending 07/2016  LABS 07/30/16>  Chems- BS=114, BUN=42, Cr=2.54, K=4.6;  CBC- Hg=10.7, otherw ok (eos=5.5%)... He needs Nephrology referral w/ slow steady rise in Cr... IMP/PLAN>>  Kristeen Mans is stable w/ his severe dis at age 49, still caring for his wife w/ AlzDis;  His Creat continues to slowly rise despite BP control/ ARB rx/ no NSAIDs/ etc=> we will refer to Nephrology for their suggestions;  He has anemia of chr renal dis as well but appears stable at 10.7 today;  He is overdue for CARDS f/u w/ [name] but infact appears stable- recall that he gets meds from the [place] & is seen there Q7mo as well...   ~  January 22, 2017:  67mo ROV & Zameer  reports a good 38mo interval- feeling well & he denies CP, palpit, dizziness, edema; his SOB/ DOE is the same (eg- w/ yard work or rushing) & he handles stairs at home OK;  He even notes more energy w/ his med adjustments; also denies cough, sput, hemoptysis;  His biggest concern is for his renal function>>     He saw Nephrology- DrColadonato 11/08/16>  Note reviewed-- CRD- stage4, secondary to DM & HBP, w/ gradual worsening over 67yrs;  Hx CAD-s/pCABG & PVD;  He gets [facility] injections at [facility];  He knows not to use NSAIDs or Cox-2s;  On Lasix for edema in LEs;  On Losartan100 for many yrs;  Sonar w/ echogenic kidneys c/w medical renal dis;  Neg SPEP/UPEP & subnephrotic proteinuria...  LABS 9/13> Cr=2.57, chems- wnl, UA- neg x 2+prot, Hg=10.6... They rec BP control <130/80, A1c goal <7, continue ARB except in presence of vol depletion (n/v/d), avoid nephrotoxic meds    He saw CARDS- [name] 10/25/16>  Hx HBP, CAD- s/p CABG, PAD- w/ intermittent claud, DM, HL, CKD... No CP or pressure, denied SOB, notes min edema & leg discomfort w/ ambulation; they performed f/u ABIs and 2DEcho.  EKG 10/25/16>  NSR, rate61, 1st degree AVB, infer Qs, NAD...  LE Art Dopplers 11/01/16>  Right ABI=0.82 (mild & stable) and Left ABI=0.59 (sl decr & in the mod range); bilat abnormal TBIs;  They rec referral for PV consult...  2DEcho 10/31/16>  Norm LV wall thickness & norm LVF w/ EF=55-60%, some basal infer HK, otherw no regional wall motion abn, Gr1DD, mod calcif AoV leaflets w/ mild to mod AS (peak grad=28, mean grad=17), mildly enlarged Ao root & asc ao37-23mm, mild MR, norm RV w/ PAsys=27    He had PV consult- DrArida 12/25/16>  Pt c/o claud after walking ~1/2 block, has to stop & rest; ABIs notes- distal pulses not palpable, pt did not want contrast for arteriogram, DrArida started Pletal trial (50mg  Bid) & is checking Ao-iliac Duplex...    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    HBP> on Metop25Bid, Amlod10, Losar100, Lasix40;  BP= 130/58 & he has intermittent mild edema; denies CP, palpit, ch in SOB, etc...    ASHD> on ASA81; known CAD & s/p CABG 2000; followed by [name] & last seen 8/18- norm Myoview 7/14; he denies CP/ angina, palpit, SOB; on Lasix40 daily now for the edema, continue same meds...    Carotid Art Dis> on  TMH96; denies cerebral ischemic symptoms; CT/ MRI Brain 2/14 w/ atrophy & sm vessel dis; CDoppler 1/17 w/ mild heterogeneous plaque bilat w/ 40-59% bilat ICAstenoses & norm subclav/ antegrade verts.    Periph Vasc Dis> on ASA81; he has stable claud w/ ambulation ("tight in legs"); Art Dopplers 9/18 showed ABIs .82R & .59L, TBIs were abn but w/o risk of tissue loss;  He was referred to DrArida for PV eval & duplex pending...    CHOL> on Lip20-1/2 tab/d; Labs 11/18 showed TChol 164, TG 110, HDL 45, LDL 97... Continue same med + better diet...    DM> on diet alone (off prev Metform); Labs 11/18 showed BS= 113 & A1c=6.6; we reviewed diet, exercise, etc....    Hypothyroid> on Synthroid80mcg/d w/ Labs 11/18 showing TSH=5.16 & reminded to take it every day, 1st thing in the AM, on empty stomach & don't eat for 45 min...    Renal Insuffic> he knows to avoid NSAIDs; Labs 12/14 w/ Creat= 1.8 and Labs 7/16 w/ Cr=2.0 & rec to incr water intake => Labs 11/18 w/ Cr= 2.68 & he is followed by [facility] now...    DJD, LBP w/ spinal stenosis> prev on Tramadol50- off now, just Tylenol; followed by DrRamos=> DrBrooks at [facility]; they did MRI w/ report of sp stenosis severe at L4-5; surg 7/14 w/ lumbar decompression & fusion L4-5...    Anemia> on FeSO4 325mg /d, MVI & AREDS2; Labs 1/16 w/ Hg= 10.1 stable; Labs 7/16 showed Hg=10, Fe=57 (20%sat), Ferritin=109; Lab 11/18 w/ Hg= 11.1 on EPO monthly at the [place]...     Anxiety> under stress caring for wife w/ Alz, fatigue, c/o legs feel tight... EXAM shows Afeb, VSS, O2sat=98% on RA;  HEENT- neg, mallampati2;  Chest- bibasilar crackles ~1/3rd way up w/o w/r;  Heart- RR gr1/6 SEM at apex & base, w/o r/g;  Abd- soft, neg;  Ext- left ankle tr swelling  CXR 01/22/17 (independently reviewed by me in the PACS system) showed borderline heart size, s/p CABG, lungs show some chr changes w/ basilar scarring/ fibrosis & we will continue to follow...  LABS 01/22/17>  FLP ok on Lip10;  Chems- BS=113, A1c=6.6, Cr=2.68;  CBC- Hg=11.1;  TSH=5.16 on 62mcg/d;  VitD=35... IMP/PLAN>>  As above- we continue to follow Pulm/ general medical; he sees [name] for Cards, DrArida for PV, DrColadonato for Renal, & [facility] [place];  Reminded to take meds regularly & the Synthroid by itself on empty stomach etc;  Asked to remain as active as poss & call for any interval problems...          Problem List:  HYPERTENSION (ICD-401.9) >>  ~  controlled on 4 meds:  METOPROLOL 25mg Bid, AMLODIPINE 10mg /d, LOSARTAN 25mg /d, and HCT 25mg - 1/2 qd...  ~  [place]right showed mild LVH, AoV sclerosis & mild AI, EF= 60%... ~  labs 11/11 showed BUN=36, Creat=2.1, K=5.1.Marland KitchenMarland Kitchen rec to stop HCTZ... ~  [facility] subseq re-started 12.5mg  HCTZ in the interim due to edema... ~  6/14:  on Metop25/d, Amlod10, Losar25, Lasix20;  BP= 160/64 but he is in pain & ?took his meds today; he denies CP, palpit, ch in SOB, edema, etc; asked to incr Metop25Bid.  ~  CXR 6/14 showed norm heart size, atherosclerotic & tort Ao, emphysematous changes in upper lobes & scarring in lower lobes, DJD in spine, NAD... ~  12/14: on Metop25Bid, Amlod10, Losar25-3/d, Lasix20;  BP= 144/60 & he denies CP, palpit, ch in SOB, edema, etc. ~  7/15: BP controlled  on Metop25Bid, Amlod10, Losar25-3/d, Lasix20;  BP= 120/74 7 he denies CP, palpit, dyspnea, etc; Labs showed K=4.7, BUN=25, Cr=1.8 stable. ~  1/16: on Metop25Bid, Amlod5, Losar100, Lasix20  (prn);  BP= 124/68 & he has persistent edema; denies CP, palpit, ch in SOB, etc. ~  7/16: on Metop25Bid, Amlod5, Losar100, Lasix20 (prn);  BP= 114/56 & he remains stable...  ARTERIOSCLEROTIC HEART DISEASE (ICD-414.00) - on ASA 81mg /d... long time pt of DrJoeLeBauer & followed by DrBrodie now... s/p CABG in 2000 by DrBartle (for L main dis)... he is active & w/o angina, palpit, change in SOB/ DOE, etc... ~  NuclearStressTest 9/08 was normal without ischemia and EF=68%... ~  12/13: he saw [name] for f/u CAD, s/p CABG, PAD, Carotid Art dis> no angina, bilat calf pain w/ ambulation but felt to be stable; he adjusted meds, but [facility]... ~  EKG 6/14 showed SBrady, rate56, 1st degree AVB, otherw wnl... ~  7/14:  Myoview showed normal wall motion & EF=61%, no CP or EKG changes... ~  2022-09-15:  He has ASHD & ASPVD on ASA81; as noted he is active & denies angina etc; he saw [name] 12/14 & stable, continue medical therapy & exercise. ~  1/16: on ASA81; known CAD & s/p CABG 2000; followed by [name] & last seen 12/15- norm Myoview 7/14; he denies CP/ angina, palpit, SOB; needs to take Lasix20 daily for the edema.  CAROTID ARTERY DISEASE>  PERIPHERAL VASCULAR DISEASE (ICD-443.9) - on ASA daily and he has bilat femoral bruits... he has seen DrDowney in the past (now [name]) w/ exerc program recommended... he walks regularly w/ stable claud symptoms, no sores or lesions on LE's... ~  ABI's 10/08 were normal bilat = 1.0.Marland KitchenMarland Kitchen waveforms were biphasic however (no change from 2005). ~  CDoppler's 10/08 showed mild bilat carotid dis w/ 40-59% RICA stenosis, and 5-57% LICA stenosis. ~  CDoppler's 10/09 showed the same= stable. ~  CDoppler's 11/10 showed stable mild carotid dis bilat> 32-20% RICA & 2-54% LICA stenoses. ~  ABI's 11/10 showed prob mod severe >50% distal right SFA stenosis, & mod dis in left SFA, ABI's=0.9 ~  Podiarty sent him to [facility] for ArtDopplers 9/11> ABIs 0.82 R & 0.74L w/ bilat  SFA dis L>R, toe pressures adeq for healing... ~  6/14: on ASA81; he denies cerebral ischemic symptoms; CT/ MRI Brain 2/14 showed sm vessel dis, vasc calcif, global atrophy (no hemorrhage or large area infarct); last CDoppler 1/13 showed mild heterogeneous plaque bilat w/ 40-59% bilat ICAstenoses... ~  6/14: on ASA81; he has stable claud w/ ambulation but lim by back pain now; last Art Dopplers 1/13 showed ABIs .92R & .80L, TBIs were abn but w/o risk of tissue loss; overdue for yearly f/u studies of Carotids & ABIs... ~  09-15-2022: He has ASHD & ASPVD on ASA81; as noted he is active & denies angina etc; he saw [name] 12/14 & stable, continue medical therapy & exercise. ~  CDoppler 1/16 showed mild heterogeneous plaque bilat w/ 40-59% bilat ICAstenoses & norm subclav/ antegrade verts.  HYPERCHOLESTEROLEMIA (ICD-272.0) - prev controlled on Simva40 + diet, [facility] CRESTOR 10mg /d. ~  [place] 2/09 on Simva40 showed Tchol 169, TG 206, HDL 37, LDL 96 ~  FLP 4/10 on Simva40 showed TChol 146, TG 145, HDL 36, LDL 82 ~  FLP 10/10 on Simva40 showed TChol 166, TG 130, HDL 38, LDL 102 ~  FLP 4/11 on Simva40 showed TChol 138, TG 87, HDL 40, LDL  80... [facility] subseq ch to Cres10 ~  FLP 11/11 on Cres10 showed TChol 147, TG 161, HDL 34, LDL 81 ~  6/12:  He indicates interval labs done by the [facility] but he didn't bring copies for [place] to review... ~  6/14: on Lip20 (off prev Crestor); Labs done at [facility] but he didn't bring reports; he is not fasting today for recheck.  ~  [place] 12/14 on Lip20 showed TChol 127, TG 56, HDL 41, LDL 75  ~  FLP 1/16 on Lip20 showed TChol 164, TG 151, HDL 36, LDL 98  DIABETES MELLITUS (ICD-250.00) - he's struggled w/ diet Rx... wt stable ~175 range... prev on Metformin 500Bid but [facility] changed to GLYBURIDE 5mg /d, then decr to 1/2 tab daily due to side effects & he stopped due to hypogly. ~  labs 10/08 showed FBS= 134, and HgA1c= 5.4... ~  labs 6/09 showed BS= 101, HgA1c= 6.4.Marland KitchenMarland Kitchen monofilament  test is normal. ~  Eye eval 1/10 at [facility] showed no DM retinopathy... ~  labs 4/10 on MetformBid showed BS= 106, A1c= 6.8, Urine Microalb= sl incr @ 7.8mg % ~  labs 10/10 showed BS= 119, A1c= 6.5...  NOTE: 2/11 [facility] ch to Glybur5mg - 1/2 daily. ~  labs 4/11 on Glyb2.5 showed BS= 97, A1c= 6.6.Marland KitchenMarland Kitchen pt subseq stopped Glybur due to low sugars. ~  labs 11/11 off meds showed BS= 149, A1c= 6.7.Marland KitchenMarland Kitchen coninue diet Rx. ~  6/12:  He indicates interim labs done by the [facility] but he didn't bring copies for [place] to review... ~  6/14: on diet alone (off prev Metform); Labs 2/14 Hosp w/ BS= 150-200+ range; now BS=132, A1c=6.6; he follows low carb diet ~  Labs 12/14 on diet alone (weight up 13# to 173#) showed BS= 131, A1c= 6.9 ~  7/15: He has DM controlled on diet alone;weight stable at 171#, BMI=25-6; Labs today showed BS=129, A1c=6.5; continue diet management. ~  1/16: on diet alone (off prev Metform); Labs 12/14 showed BS= 131, A1c=6.9; Labs 1/16 showed BS= 127; we reviewed diet, exercise, etc ~  7/16:  On diet alone> Labs 7/16 showed BS=116, A1c=6.3; we reviewed diet, exercise, etc...  BORDERLINE TSH >> ~  Serial labs showed rising TSH> 3.57 => 5.84 => 8.74 (12/14) and we decided to start Synthroid47mcg/d;  [facility] & he did not call to check w/ [place]... ~  7/15  Follow up TSH= 3.50 not on meds, clinically euthyroid, good energy, etc;  we will follow closely...  ~  1/16:  Labs here 1/16 showed TSH= 9.10 & he is requested to restart the Levothy69mcg/d... ~  7/16: now on Levothy50 every day> Labs 7/16 showed TSH= 3.62, continue same.  RENAL INSUFFICIENCY (ICD-588.9) - Creat 1.8-2.0 in the past... ~  labs 6/09 showed BUN= 31, Creat= 1.6... ~  labs 9/09 showed BUN= 25, Creat= 1.6, K= 4.5 ~  labs 4/10 showed BUN= 26, Creat= 1.6, K= 5.0 ~  labs 10/10 showed BUN= 21, Creat= 1.6 ~  labs 4/11 showed BUN= 30, Creat= 1.9..Marland Kitchen advised to stay well hydrated. ~  labs 11/11 showed BUN= 36, Creat=  2.1.Marland KitchenMarland Kitchen rec to stop the HCTZ ~  6/12:  He indicates that the [facility] restarted HCTZ 12.5mg /d & they are following labs... ~  6/14: he knows to avoid NSAIDs; Labs 2/14 w/ Creat= 1.4-1.6 range; Labs today showed BUN= 21, Creat= 1.7... ~  Labs 12/14 showed BUN= 23, Creat= 1.8 ~  Labs 7/15 showed BUN= 25,  Cr= 1.8 ~  Labs 1/16 showed BUN= 25, Cr= 1.5 ~  Labs 7/16 showed BUN= 28, Cr= 2.0  DEGENERATIVE JOINT DISEASE (ICD-715.90) LOW BACK PAIN >> he has severe sp stenosis L4-5 per MRI at [facility] 6/14... ~  Notes and Records fro [facility]- DrRamos, DrBrooks> reviewed, they plan surgery if cleared fro CV standpoint...  Hx of ANEMIA OF CHRONIC DISEASE (ICD-285.29) - eval by DrGranfortuna w/ ACD secondary to his renal insuffic and Rx w/ aranesp periodically... nothing else found on his extensive eval... he was taking Ferrous Gluconate daily (it no longer makes him tired)... ~  labs 2/09 w/ Hg= 11.2.Marland Kitchen. ~  labs 6/09 w/ Hg= 11.0.Marland Kitchen. rec> OTC Fe prep but he never got it! ~  labs 9/09 showed Hg= 10.5, Fe= 81 ~  labs 4/10 showed Hg= 10.5, MCV= 92, Fe= 77 (22%sat), B12= 590, Folate>20... rec> OTC Fe daily. ~  labs 10/10 showed Hg= 10.3, Fe= 88 ~  labs 4/11 showed Hg= 10.9,  Fe= 58... continue Ferrous Gluc daily w/ VitC ~  labs 11/11 showed Hg= 11.6 ~  6/12:  Again he indicated that the [facility] is following his labs, aske to bring copies for [place] to review. ~  6/14: on FeSO4 325mg /d; Labs 2/14 w/ Hg= 10-11 range; Labs now show Hg= 11.3 ~  Labs 12/14 showed Hg= 10.0 ~  7/15: He has a chronic anemia & f/u labs today showed Hg=10.4 (stable) w/ MCV=91, Fe=84 (27%), Ferritin 104; he continues on MVI, Fe, etc. ~  1/16: on FeSO4 325mg /d; Labs 1/16 w/ Hg= 10.1 stable. ~  7/16: on Fe daily> Labs showed Hg=10, Fe=57 (20%sat), Ferritin=109, continue same...   Past Surgical History:  Procedure Laterality Date  . cataract surgery    . COLONOSCOPY     Hx: of  . CORONARY ARTERY BYPASS GRAFT  2000   by Dr.  Cyndia Bent  . LUMBAR LAMINECTOMY/DECOMPRESSION MICRODISCECTOMY N/A 09/10/2012   Procedure: LUMBAR DECOMPRESSION,  IN SITU FUSION LUMBAR 4-5;  Surgeon: Melina Schools, MD;  Location: [place];  Service: Orthopedics;  Laterality: N/A;  . TONSILLECTOMY      Outpatient Encounter Medications as of 01/22/2017  Medication Sig  . amLODipine (NORVASC) 10 MG tablet Take 10 mg by mouth daily.   Marland Kitchen aspirin EC 81 MG tablet Take 81 mg by mouth every morning.   Marland Kitchen atorvastatin (LIPITOR) 20 MG tablet Take 10 mg by mouth every evening.   . Calcium Carbonate-Vitamin D (CALCIUM 600/VITAMIN D PO) Take 1 tablet by mouth 2 (two) times daily.   . cilostazol (PLETAL) 50 MG tablet Take 1 tablet (50 mg total) by mouth 2 (two) times daily.  . ferrous gluconate (FERGON) 325 MG tablet Take 325 mg by mouth 2 (two) times daily.   . furosemide (LASIX) 40 MG tablet Take 40 mg by mouth daily.  Marland Kitchen glucose blood (ACCU-CHEK AVIVA PLUS) test strip CHECK BLOOD SUGAR TWICE A DAY.  Marland Kitchen Lancets (ACCU-CHEK MULTICLIX) lancets USE TWICE DAILY.  Marland Kitchen levothyroxine (SYNTHROID, LEVOTHROID) 50 MCG tablet TAKE ONE TABLET BY MOUTH ONCE DAILY BEFORE BREAKFAST  . losartan (COZAAR) 100 MG tablet Take 100 mg by mouth daily.  . metoprolol tartrate (LOPRESSOR) 25 MG tablet Take 1 tablet (25 mg total) by mouth 2 (two) times daily.  . Multiple Vitamins-Minerals (CENTRUM SILVER PO) Take 1 tablet by mouth every morning.   . Multiple Vitamins-Minerals (PRESERVISION AREDS 2 PO) Take 1 tablet by mouth every evening.   No facility-administered encounter medications on file as  of 01/22/2017.     No Known Allergies   Immunization History  Administered Date(s) Administered  . Influenza Split 04/07/2012, 12/25/2012  . Influenza Whole 12/09/2007, 12/07/2008, 12/05/2009  . Influenza, High Dose Seasonal PF 10/31/2015, 11/22/2016  . Influenza, Seasonal, Injecte, Preservative Fre 03/15/2014  . Influenza,inj,Quad PF,6+ Mos 12/28/2014  . Tdap 01/29/2011    Current  Medications, Allergies, Past Medical History, Past Surgical History, Family History, and Social History were reviewed in [employer] record.   Review of Systems        See HPI - all other systems neg except as noted... The patient complains of decreased hearing, dyspnea on exertion, and difficulty walking.  The patient denies anorexia, fever, weight loss, weight gain, vision loss, hoarseness, chest pain, syncope, peripheral edema, prolonged cough, headaches, hemoptysis, abdominal pain, melena, hematochezia, severe indigestion/heartburn, hematuria, incontinence, muscle weakness, suspicious skin lesions, transient blindness, depression, unusual weight change, abnormal bleeding, enlarged lymph nodes, and angioedema.     Objective:   Physical Exam     WD, WN, 83 y/o WM in NAD... GENERAL:  Alert & oriented; pleasant & cooperative... HEENT:  [place]/AT, EOM-wnl, PERRLA, EACs-clear, TMs-wnl, NOSE-clear, THROAT-clear & wnl. NECK:  Supple w/ fairROM; no JVD; normal carotid impulses w/ faint right bruit; no thyromegaly or nodules palpated; no lymphadenopathy. CHEST:  Clear to P & A; without wheezes/ rales/ or rhonchi heard... HEART:  Regular, gr 1/6 SEM without rubs or gallops heard... ABDOMEN:  Soft & nontender; normal bowel sounds; no organomegaly or masses detected. EXT: without deformities, mod arthritic changes; no varicose veins/ venous insuffic/ or edema. he has bilat fem art bruits & minor pressure area betw right 4th & 5th toes... NEURO:  CN's intact;  no focal neuro deficits... DERM:  Lesion on medial side of right small toe, sl tender...  RADIOLOGY DATA:  Reviewed in the EPIC EMR & discussed w/ the patient...  LABORATORY DATA:  Reviewed in the EPIC EMR & discussed w/ the patient...   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  HBP>  BP is OK on the current regimen; continue same; he is also followed by the [place]...  ASHD/ CAD>   Followed by [name] for [facility] & [facility]; continue current meds...  ASPVD- Carotid art dis & PAD>  Decr ABIs & Carotid dis> on ASA 81mg /d; followed by [name] et al...  CHOL>  On Lipitor20-taking 1/2 tab and FLP is at goals...  DM>  On diet alone & labs also followed by there [facility]; labs today showed BS~120 & prev A1c=6.3.Marland KitchenMarland Kitchen  Renal Insuffic>  Creat stable in the 2.3 range on current meds & he knows to avoid NSAIDs.Marland KitchenMarland Kitchen  Anemia>  Hg seems stable in the 10 range w/ ACD...     Medication List        Accurate as of 01/22/17  4:59 PM. Always use your most recent med list.          accu-chek multiclix lancets USE TWICE DAILY.   amLODipine 10 MG tablet Commonly known as:  NORVASC   aspirin EC 81 MG tablet   atorvastatin 20 MG tablet Commonly known as:  LIPITOR   CALCIUM 600/VITAMIN D PO   * CENTRUM SILVER PO   * PRESERVISION AREDS 2 PO   cilostazol 50 MG tablet Commonly known as:  PLETAL Take 1 tablet (50 mg total) by mouth 2 (two) times daily.   ferrous gluconate 325 MG tablet Commonly known as:  [employer]  furosemide 40 MG tablet Commonly known as:  LASIX   glucose blood test strip Commonly known as:  ACCU-CHEK AVIVA PLUS CHECK BLOOD SUGAR TWICE A DAY.   levothyroxine 50 MCG tablet Commonly known as:  SYNTHROID, LEVOTHROID TAKE ONE TABLET BY MOUTH ONCE DAILY BEFORE BREAKFAST   losartan 100 MG tablet Commonly known as:  COZAAR   metoprolol tartrate 25 MG tablet Commonly known as:  LOPRESSOR Take 1 tablet (25 mg total) by mouth 2 (two) times daily.      * This list has 2 medication(s) that are the same as other medications prescribed for you. Read the directions carefully, and ask your doctor or other care provider to review them with you.

## 2017-01-22 NOTE — Patient Instructions (Signed)
Today we updated your med list in our EPIC system...    Continue your current medications the same...  Stay as active as possible & be sure you have the ability to exercise indoors when it is too cold or rainy...    Consider the [employer], silver snaekers, etc...  Today we did your follow up CXR & FASTING blood work...    We will contact you w/ the results when available...   Call for any questions or if we can be of service in any way...  Let's plan a follow up visit in 82mo, sooner if needed for any problems.Marland KitchenMarland Kitchen

## 2017-02-13 DIAGNOSIS — D631 Anemia in chronic kidney disease: Secondary | ICD-10-CM | POA: Diagnosis not present

## 2017-02-13 DIAGNOSIS — R809 Proteinuria, unspecified: Secondary | ICD-10-CM | POA: Diagnosis not present

## 2017-02-13 DIAGNOSIS — E1122 Type 2 diabetes mellitus with diabetic chronic kidney disease: Secondary | ICD-10-CM | POA: Diagnosis not present

## 2017-02-13 DIAGNOSIS — N184 Chronic kidney disease, stage 4 (severe): Secondary | ICD-10-CM | POA: Diagnosis not present

## 2017-02-13 DIAGNOSIS — I129 Hypertensive chronic kidney disease with stage 1 through stage 4 chronic kidney disease, or unspecified chronic kidney disease: Secondary | ICD-10-CM | POA: Diagnosis not present

## 2017-02-13 DIAGNOSIS — I739 Peripheral vascular disease, unspecified: Secondary | ICD-10-CM | POA: Diagnosis not present

## 2017-02-27 DIAGNOSIS — I779 Disorder of arteries and arterioles, unspecified: Secondary | ICD-10-CM | POA: Insufficient documentation

## 2017-02-27 DIAGNOSIS — I10 Essential (primary) hypertension: Secondary | ICD-10-CM | POA: Insufficient documentation

## 2017-02-27 DIAGNOSIS — I739 Peripheral vascular disease, unspecified: Secondary | ICD-10-CM | POA: Insufficient documentation

## 2017-02-28 ENCOUNTER — Other Ambulatory Visit (HOSPITAL_COMMUNITY): Payer: Self-pay

## 2017-02-28 NOTE — Discharge Instructions (Signed)

## 2017-03-01 ENCOUNTER — Ambulatory Visit (HOSPITAL_COMMUNITY)
Admission: RE | Admit: 2017-03-01 | Discharge: 2017-03-01 | Disposition: A | Discharge status: Home / Self Care | Payer: Medicare Other | Source: Ambulatory Visit | Attending: Nephrology | Admitting: Nephrology

## 2017-03-01 DIAGNOSIS — N189 Chronic kidney disease, unspecified: Secondary | ICD-10-CM | POA: Insufficient documentation

## 2017-03-01 DIAGNOSIS — D631 Anemia in chronic kidney disease: Secondary | ICD-10-CM | POA: Insufficient documentation

## 2017-03-01 MED ORDER — SODIUM CHLORIDE 0.9 % IV SOLN
510.0000 mg | INTRAVENOUS | Status: DC
  Administered 2017-03-01: 09:00:00 510 mg via INTRAVENOUS
  Filled 2017-03-01: qty 17

## 2017-03-08 ENCOUNTER — Ambulatory Visit (HOSPITAL_COMMUNITY)
Admission: RE | Admit: 2017-03-08 | Discharge: 2017-03-08 | Disposition: A | Discharge status: Home / Self Care | Payer: Medicare Other | Source: Ambulatory Visit | Attending: Nephrology | Admitting: Nephrology

## 2017-03-08 DIAGNOSIS — D631 Anemia in chronic kidney disease: Secondary | ICD-10-CM | POA: Diagnosis not present

## 2017-03-08 DIAGNOSIS — N189 Chronic kidney disease, unspecified: Secondary | ICD-10-CM | POA: Diagnosis not present

## 2017-03-08 MED ORDER — SODIUM CHLORIDE 0.9 % IV SOLN
510.0000 mg | INTRAVENOUS | Status: DC
  Administered 2017-03-08: 10:00:00 510 mg via INTRAVENOUS
  Filled 2017-03-08: qty 17

## 2017-03-26 ENCOUNTER — Encounter: Payer: Self-pay | Admitting: Cardiovascular Disease

## 2017-03-26 ENCOUNTER — Ambulatory Visit (INDEPENDENT_AMBULATORY_CARE_PROVIDER_SITE_OTHER): Payer: Medicare Other | Admitting: Cardiovascular Disease

## 2017-03-26 VITALS — BP 128/48 | HR 76 | Ht 69.0 in | Wt 163.4 lb

## 2017-03-26 DIAGNOSIS — I10 Essential (primary) hypertension: Secondary | ICD-10-CM | POA: Diagnosis not present

## 2017-03-26 DIAGNOSIS — I2510 Atherosclerotic heart disease of native coronary artery without angina pectoris: Secondary | ICD-10-CM | POA: Diagnosis not present

## 2017-03-26 DIAGNOSIS — I739 Peripheral vascular disease, unspecified: Secondary | ICD-10-CM

## 2017-03-26 MED ORDER — CILOSTAZOL 100 MG PO TABS: 100.0000 mg | ORAL_TABLET | Freq: Two times a day (BID) | ORAL | 3 refills | Status: DC

## 2017-03-26 NOTE — Patient Instructions (Addendum)
Medication Instructions:  INCREASE- Ciulostazol (Pletal) 100 mg twice a day  If you need a refill on your cardiac medications before your next appointment, please call your pharmacy.  Labwork: None Ordered   Testing/Procedures: None Ordered  Follow-Up: Your physician wants you to follow-up in: 6 Months with Dr Fletcher Anon. You should receive a reminder letter in the mail two months in advance. If you do not receive a letter, please call our office [phone].    Thank you for choosing [facility] at [facility]!!

## 2017-03-26 NOTE — Progress Notes (Signed)
Cardiology Office Note   Date:  03/26/2017   ID:  Henry [name], DOB May 29, 1924, MRN [id number]  PCP:  Noralee Space, MD  Cardiologist:  Dr. Burt Knack  Chief Complaint  Patient presents with  . Follow-up    3 month, pt ststed tighten feeling in his toes      History of Present Illness: Henry [name] is a 84 y.o. male who is here today for follow-up visit regarding peripheral arterial disease. The patient has known history of coronary artery disease with remote CABG, peripheral arterial disease with chronic claudication, moderate aortic stenosis, essential hypertension, hyperlipidemia and chronic kidney disease.  His creatinine is around 2.5.  Has been doing reasonably well from a cardiac standpoint with no chest pain or shortness of breath. He was seen recently for worsening claudication especially on the right side. Noninvasive  vascular evaluation  showed a drop in his ABI on the left side to the moderate range with evidence of distal left SFA occlusion.  However, by physical exam he was noted to have diminished right femoral pulse suggestive of inflow disease. Given his chronic kidney disease and age, I elected to treat him medically.  I prescribed cilostazol and he reports slight improvement in symptoms.  He does not seem to be as bothered as before with claudication.    Past Medical History:  Diagnosis Date  . CAD of autologous bypass graft   . Carotid artery disease ([place])   . Diabetes mellitus without complication ([place])   . DJD (degenerative joint disease)   . History of anemia of chronic disease   . Hypercholesteremia   . Hypertension   . PVD (peripheral vascular disease) with claudication ([place])   . Renal insufficiency     Past Surgical History:  Procedure Laterality Date  . cataract surgery    . COLONOSCOPY     Hx: of  . CORONARY ARTERY BYPASS GRAFT  2000   by Dr. Cyndia Bent  . LUMBAR LAMINECTOMY/DECOMPRESSION MICRODISCECTOMY N/A 09/10/2012   Procedure: LUMBAR  DECOMPRESSION,  IN SITU FUSION LUMBAR 4-5;  Surgeon: Melina Schools, MD;  Location: [place];  Service: Orthopedics;  Laterality: N/A;  . TONSILLECTOMY       Current Outpatient Medications  Medication Sig Dispense Refill  . amLODipine (NORVASC) 10 MG tablet Take 10 mg by mouth daily.     Marland Kitchen aspirin EC 81 MG tablet Take 81 mg by mouth every morning.     Marland Kitchen atorvastatin (LIPITOR) 20 MG tablet Take 10 mg by mouth every evening.     . Calcium Carbonate-Vitamin D (CALCIUM 600/VITAMIN D PO) Take 1 tablet by mouth 2 (two) times daily.     . cilostazol (PLETAL) 50 MG tablet Take 1 tablet (50 mg total) by mouth 2 (two) times daily. 180 tablet 3  . ferrous gluconate (FERGON) 325 MG tablet Take 325 mg by mouth 2 (two) times daily.     . furosemide (LASIX) 40 MG tablet Take 40 mg by mouth daily.    Marland Kitchen glucose blood (ACCU-CHEK AVIVA PLUS) test strip CHECK BLOOD SUGAR TWICE A DAY. 50 each 5  . Lancets (ACCU-CHEK MULTICLIX) lancets USE TWICE DAILY. 102 each 5  . levothyroxine (SYNTHROID, LEVOTHROID) 50 MCG tablet TAKE ONE TABLET BY MOUTH ONCE DAILY BEFORE BREAKFAST 90 tablet 3  . losartan (COZAAR) 100 MG tablet Take 100 mg by mouth daily.    . metoprolol tartrate (LOPRESSOR) 25 MG tablet Take 1 tablet (25 mg total) by mouth 2 (two) times  daily. 60 tablet 5  . Multiple Vitamins-Minerals (CENTRUM SILVER PO) Take 1 tablet by mouth every morning.     . Multiple Vitamins-Minerals (PRESERVISION AREDS 2 PO) Take 1 tablet by mouth every evening.     No current facility-administered medications for this visit.     Allergies:   Patient has no known allergies.    Social History:  The patient  reports that he quit smoking about 64 years ago. His smoking use included cigarettes. He has a 10.00 pack-year smoking history. he has never used smokeless tobacco. He reports that he drinks alcohol. He reports that he does not use drugs.   Family History:  The patient's family history is negative for coronary artery  disease.   ROS:  Please see the history of present illness.   Otherwise, review of systems are positive for none.   All other systems are reviewed and negative.    PHYSICAL EXAM: VS:  BP (!) 128/48   Pulse 76   Ht 5\' 9"  (1.753 m)   Wt 163 lb 6.4 oz (74.1 kg)   BMI 24.13 kg/m  , BMI Body mass index is 24.13 kg/m. GEN: Well nourished, well developed, in no acute distress  HEENT: normal  Neck: no JVD, carotid bruits, or masses Cardiac: RRR; no  rubs, or gallops,no edema .  3 / 6 crescendo decrescendo systolic murmur in the aortic area which is mid peaking Respiratory:  clear to auscultation bilaterally, normal work of breathing GI: soft, nontender, nondistended, + BS MS: no deformity or atrophy  Skin: warm and dry, no rash Neuro:  Strength and sensation are intact Psych: euthymic mood, full affect Vascular: Femoral pulses +1 on the right and +2 on the left.  Distal pulses are nonpalpable.  EKG:  EKG is not ordered today.   Recent Labs: 01/22/2017: ALT 11; BUN 58; Creatinine, Ser 2.68; Hemoglobin 11.1; Platelets 226.0; Potassium 4.7; Sodium 138; TSH 5.16    Lipid Panel    Component Value Date/Time   CHOL 164 01/22/2017 1155   TRIG 110.0 01/22/2017 1155   HDL 45.20 01/22/2017 1155   CHOLHDL 4 01/22/2017 1155   VLDL 22.0 01/22/2017 1155   LDLCALC 97 01/22/2017 1155   LDLDIRECT 96.2 04/04/2007 1049      Wt Readings from Last 3 Encounters:  03/26/17 163 lb 6.4 oz (74.1 kg)  03/08/17 155 lb (70.3 kg)  01/22/17 155 lb 9.6 oz (70.6 kg)       No flowsheet data found.    ASSESSMENT AND PLAN:  1.  Peripheral arterial disease with moderate bilateral claudication worse on the right side.  Slight improvement in symptoms with cilostazol and currently the patient does not feel to be significantly limited by claudication.  Due to that, I recommend continuing medical therapy.  I elected to increase cilostazol to 100 mg twice daily.   2.  Coronary artery disease without  angina: Continue medical therapy.  3.  Essential hypertension: Blood pressure is controlled.  4.  Aortic stenosis: Stable on recent echocardiogram.    Disposition:   FU with me in 6 months  Signed,  Kathlyn Sacramento, MD  03/26/2017 11:13 AM    [place]

## 2017-04-24 ENCOUNTER — Ambulatory Visit: Payer: Self-pay | Admitting: Podiatry

## 2017-04-26 DIAGNOSIS — H353112 Nonexudative age-related macular degeneration, right eye, intermediate dry stage: Secondary | ICD-10-CM | POA: Diagnosis not present

## 2017-04-26 DIAGNOSIS — E119 Type 2 diabetes mellitus without complications: Secondary | ICD-10-CM | POA: Diagnosis not present

## 2017-04-26 DIAGNOSIS — H353221 Exudative age-related macular degeneration, left eye, with active choroidal neovascularization: Secondary | ICD-10-CM | POA: Diagnosis not present

## 2017-05-07 ENCOUNTER — Other Ambulatory Visit: Payer: Self-pay | Admitting: Orthopedic Surgery

## 2017-05-07 DIAGNOSIS — R0781 Pleurodynia: Secondary | ICD-10-CM | POA: Diagnosis not present

## 2017-05-07 DIAGNOSIS — S36119A Unspecified injury of liver, initial encounter: Secondary | ICD-10-CM

## 2017-05-10 ENCOUNTER — Ambulatory Visit
Admission: RE | Admit: 2017-05-10 | Discharge: 2017-05-10 | Disposition: A | Discharge status: Home / Self Care | Payer: Medicare Other | Source: Ambulatory Visit | Attending: Orthopedic Surgery | Admitting: Orthopedic Surgery

## 2017-05-10 ENCOUNTER — Other Ambulatory Visit: Payer: Self-pay | Admitting: Orthopedic Surgery

## 2017-05-10 DIAGNOSIS — S3991XA Unspecified injury of abdomen, initial encounter: Secondary | ICD-10-CM | POA: Diagnosis not present

## 2017-05-10 DIAGNOSIS — S36119A Unspecified injury of liver, initial encounter: Secondary | ICD-10-CM

## 2017-05-10 DIAGNOSIS — R109 Unspecified abdominal pain: Secondary | ICD-10-CM | POA: Diagnosis not present

## 2017-05-15 DIAGNOSIS — D631 Anemia in chronic kidney disease: Secondary | ICD-10-CM | POA: Diagnosis not present

## 2017-05-15 DIAGNOSIS — R809 Proteinuria, unspecified: Secondary | ICD-10-CM | POA: Diagnosis not present

## 2017-05-15 DIAGNOSIS — E1122 Type 2 diabetes mellitus with diabetic chronic kidney disease: Secondary | ICD-10-CM | POA: Diagnosis not present

## 2017-05-15 DIAGNOSIS — N184 Chronic kidney disease, stage 4 (severe): Secondary | ICD-10-CM | POA: Diagnosis not present

## 2017-05-15 DIAGNOSIS — I129 Hypertensive chronic kidney disease with stage 1 through stage 4 chronic kidney disease, or unspecified chronic kidney disease: Secondary | ICD-10-CM | POA: Diagnosis not present

## 2017-05-15 DIAGNOSIS — I739 Peripheral vascular disease, unspecified: Secondary | ICD-10-CM | POA: Diagnosis not present

## 2017-05-21 DIAGNOSIS — R0781 Pleurodynia: Secondary | ICD-10-CM | POA: Diagnosis not present

## 2017-05-21 DIAGNOSIS — R42 Dizziness and giddiness: Secondary | ICD-10-CM | POA: Diagnosis not present

## 2017-05-29 DIAGNOSIS — I129 Hypertensive chronic kidney disease with stage 1 through stage 4 chronic kidney disease, or unspecified chronic kidney disease: Secondary | ICD-10-CM | POA: Diagnosis not present

## 2017-05-29 DIAGNOSIS — N184 Chronic kidney disease, stage 4 (severe): Secondary | ICD-10-CM | POA: Diagnosis not present

## 2017-06-06 DIAGNOSIS — H353221 Exudative age-related macular degeneration, left eye, with active choroidal neovascularization: Secondary | ICD-10-CM | POA: Diagnosis not present

## 2017-07-10 ENCOUNTER — Ambulatory Visit (INDEPENDENT_AMBULATORY_CARE_PROVIDER_SITE_OTHER): Payer: Medicare Other | Admitting: Podiatry

## 2017-07-10 VITALS — BP 160/67 | HR 70

## 2017-07-10 DIAGNOSIS — B351 Tinea unguium: Secondary | ICD-10-CM | POA: Diagnosis not present

## 2017-07-10 DIAGNOSIS — M79675 Pain in left toe(s): Secondary | ICD-10-CM

## 2017-07-10 DIAGNOSIS — E1142 Type 2 diabetes mellitus with diabetic polyneuropathy: Secondary | ICD-10-CM | POA: Diagnosis not present

## 2017-07-10 DIAGNOSIS — M79674 Pain in right toe(s): Secondary | ICD-10-CM | POA: Diagnosis not present

## 2017-07-10 NOTE — Progress Notes (Signed)
This patient presents to the office with chief complaint of long thick nails and diabetic feet.  This patient  says there  is   pain and discomfort in his  Feet.due to neuropathy.  He says his toes are tingly.  This patient says there are long thick painful nails.  These nails are painful walking and wearing shoes.  Patient has no history of infection or drainage from both feet.  Patient is unable to  self treat his own nails . This patient presents  to the office today for treatment of the  long nails and a foot evaluation due to history of  Diabetes. Patient is taking pletal.  General Appearance  Alert, conversant and in no acute stress.  Vascular  Dorsalis pedis  pulses are palpable  bilaterally. Posterior tibial pulses are not palpable due to swelling in both ankles. Capillary return is within normal limits  bilaterally. Temperature is within normal limits  bilaterally.  Neurologic  Senn-Weinstein monofilament wire test within normal limits  bilaterally. Muscle power within normal limits bilaterally.  Nails Thick disfigured discolored nails with subungual debris  from hallux to fifth toes bilaterally. No evidence of bacterial infection or drainage bilaterally.  Orthopedic  No limitations of motion of motion feet .  No crepitus or effusions noted.  No bony pathology or digital deformities noted.  Skin  normotropic skin with no porokeratosis noted bilaterally.  No signs of infections or ulcers noted.     Onychomycosis  Diabetes with no foot complications  IE  Debride nails x 10.  A diabetic foot exam was performed and there is no evidence of any vascular or neurologic pathology.   RTC 3 months. I discussed his diabetic neuropathy  with this patient.  I recommended he discuss drug treatment for the neuropathy with his medical doctor.  RTC 3 months.   Gardiner Barefoot DPM

## 2017-07-17 DIAGNOSIS — H353221 Exudative age-related macular degeneration, left eye, with active choroidal neovascularization: Secondary | ICD-10-CM | POA: Diagnosis not present

## 2017-07-24 ENCOUNTER — Encounter: Payer: Self-pay | Admitting: Pulmonary Disease

## 2017-07-24 ENCOUNTER — Ambulatory Visit (INDEPENDENT_AMBULATORY_CARE_PROVIDER_SITE_OTHER): Payer: Medicare Other | Admitting: Pulmonary Disease

## 2017-07-24 VITALS — BP 138/66 | HR 80 | Temp 97.9°F | Ht 69.0 in | Wt 151.8 lb

## 2017-07-24 DIAGNOSIS — I10 Essential (primary) hypertension: Secondary | ICD-10-CM

## 2017-07-24 DIAGNOSIS — I739 Peripheral vascular disease, unspecified: Secondary | ICD-10-CM

## 2017-07-24 DIAGNOSIS — J8410 Pulmonary fibrosis, unspecified: Secondary | ICD-10-CM | POA: Diagnosis not present

## 2017-07-24 DIAGNOSIS — N184 Chronic kidney disease, stage 4 (severe): Secondary | ICD-10-CM

## 2017-07-24 DIAGNOSIS — M150 Primary generalized (osteo)arthritis: Secondary | ICD-10-CM

## 2017-07-24 DIAGNOSIS — E119 Type 2 diabetes mellitus without complications: Secondary | ICD-10-CM

## 2017-07-24 DIAGNOSIS — I350 Nonrheumatic aortic (valve) stenosis: Secondary | ICD-10-CM | POA: Diagnosis not present

## 2017-07-24 DIAGNOSIS — G319 Degenerative disease of nervous system, unspecified: Secondary | ICD-10-CM | POA: Diagnosis not present

## 2017-07-24 DIAGNOSIS — I25810 Atherosclerosis of coronary artery bypass graft(s) without angina pectoris: Secondary | ICD-10-CM

## 2017-07-24 DIAGNOSIS — M159 Polyosteoarthritis, unspecified: Secondary | ICD-10-CM

## 2017-07-24 DIAGNOSIS — N185 Chronic kidney disease, stage 5: Secondary | ICD-10-CM | POA: Insufficient documentation

## 2017-07-24 DIAGNOSIS — E7800 Pure hypercholesterolemia, unspecified: Secondary | ICD-10-CM

## 2017-07-24 DIAGNOSIS — D638 Anemia in other chronic diseases classified elsewhere: Secondary | ICD-10-CM

## 2017-07-24 DIAGNOSIS — I6523 Occlusion and stenosis of bilateral carotid arteries: Secondary | ICD-10-CM | POA: Diagnosis not present

## 2017-07-24 DIAGNOSIS — E039 Hypothyroidism, unspecified: Secondary | ICD-10-CM | POA: Diagnosis not present

## 2017-07-24 NOTE — Patient Instructions (Signed)
Today we updated your med list in our EPIC system...    Continue your current medications the same...  We reviewed your recent labs & eval by the renal team- [name]...    Continue their follow up visits every 3 months...  Call for any questions...  Let's plan a follow up visit in 19mo, sooner if needed for problems.Henry KitchenMarland [name]

## 2017-07-25 ENCOUNTER — Encounter: Payer: Self-pay | Admitting: Pulmonary Disease

## 2017-07-25 NOTE — Progress Notes (Signed)
Subjective:    Patient ID: Henry [name], male    DOB: 1924-09-17, 84 y.o.   MRN: [id number]  HPI 84 y/o WM here for a follow up visit... he has mult med problems as listed and is followed by DrBBrodie=>DrMCoooper, and DrPerry as well... he gets his meds filled at the [facility] which makes for a complicated array of perscribed meds and substituted alternatives... ~  SEE PREV EPIC NOTES FOR OLDER DATA >>     CDoppler 1/13 showed mild heterogeneous plaque bilat w/ 40-59% bilat ICAstenoses...  CT/ MRI Brain 2/14 w/ atrophy & sm vessel dis  CXR 6/14 showed normal heart size, tortuous Ao, mild COPD changes w/ clear lungs/ NAD, DJD in Tspine...   LABS 6/14:  Chems- ok x BS=132 A1c=6.6 Creat=1.7;  CBC- Hg=11.3;  TSH=5.84;  B12=530;  BNP=198...  Lexiscan Myoview 08/2012:  NEG nuclear study- nl BP response, no CP, no signif ST changes, EF=61%, norm wall motion...   LABS 12/14:  FLP- at goals on Lip20;  Chems- ok x BS=131, A1c=6.9, Cr=1.8;  CBC= Hg= 10.0;  TSH=8.74 & rec to start Synthroid50...  LABS 7/15:  Chems- ok x BS=129, A1c=6.5, BUN=25, Cr=1.8;  CBC- ok x Hg=10.4, Fe=84 (27%sat), Ferritin=104;  TSH=3.50...  Art Dopplers 12/15 showed ABIs .79R & .71L, TBIs were abn but w/o risk of tissue loss  CXR 1/16 showed norm heart size, prior CABG, COPD w/ scarring at the bases, NAD...  LABS 1/16:  FLP- ok on Atorva20;  Chems- ok w/ stable Cr=1.5, BS=127;  CBC- mild chr anemia w/ Hg=10.1;  B12=408; TSH=9.10 off meds...   ~  August 31, 2014:  40mo ROV & Henry [name] has had a good interval- his CC's are "tightness in my legs" esp w/ walking & he has known ASPVD w/ f/u [name] soon, and feeling sl better on the Synthroid50 regularly... We reviewed the following medical problems during today's office visit >>     HBP> on Metop25Bid, Amlod5, Losar100, Lasix20 (prn);  BP= 114/56 & he has persistent mild edema; denies CP, palpit, ch in SOB, etc...    ASHD> on ASA81; known CAD & s/p CABG 2000; followed by [name] & last  seen 12/15- norm Myoview 7/14; he denies CP/ angina, palpit, SOB; needs to take Lasix20 daily for the edema.    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    Periph Vasc Dis> on ASA81; he has stable claud w/ ambulation; last Art Dopplers 12/15 showed ABIs .79R & .71L, TBIs were abn but w/o risk of tissue loss; overdue for yearly f/u studies...    CHOL> on Lip20 (off prev Crestor); Labs 1/16 showed TChol 164, TG 151, HDL 36, LDL 98... Needs better diet.    DM> on diet alone (off prev Metform); Labs 1/16 showed BS= 127; Labs 7/16 showed BS=116, A1c=6.3; we reviewed diet, exercise, etc....    Renal Insuffic> he knows to avoid NSAIDs; Labs 12/14 w/ Creat= 1.8 and Labs 1/16 showed Cr= 1.5; Labs 7/16 w/ Cr=2.0 & rec to incr water intake...    DJD, LBP w/ spinal stenosis> on Tramadol50 prn; followed by DrRamos=> DrBrooks at [facility]; they did MRI w/ report of sp stenosis severe at L4-5; surg 7/14 w/ lumbar decompression & fusion L4-5...    Anemia> on FeSO4 325mg /d; Labs 1/16 w/ Hg= 10.1 stable; Labs  7/16 showed Hg=10, Fe=57 (20%sat), Ferritin=109; continue same... We reviewed prob list, meds, xrays and labs> see below for updates >>   LABS 7/16:  Chems- ok w/ BS=116, A1c=6.3, BUN=28, Cr=2.0;  CBC- anemic w/ Hg=10.0, Fe=57 (20%), Ferritin=109;  TSH=3.62... PLAN>>  Continue same meds, continue f/u [name], ROV w/ me in 98mo...  ~  March 23, 2015:  30mo ROV & Henry [name] returns for routine ROV- he tells me he recently fell on the ice & hurt left leg, ortho eval was neg for any fxs; otherw feeling well & he denies CP, palpit, SOB, edema; he denies cough, sput, hemoptysis, etc;  he had Cards f/u w/ [name] 12/2014> f/u HBP, CAD, remote CABG, HL, ASPVD w/ claudication, CRI w/ Cr~2> BNP reported elev at [facility] & he was given more Lasix  (40mg /d); f/u 2DEcho 02/08/15 showed mild LVH, norm LVF w/ EF=55-60%, no regional wall motion abn, Gr1DD, mod AoV calcif & mild AS/ trivAI, mild MR, mild LAdil, norm RVF & PAsys=29...    EXAM shows Afeb, VSS, O2sat=98% on RA;  HEENT- neg, mallampati2;  Chest- bibasilar crackles ~1/3rd way up w/o w/r;  Heart- RR gr1/6 SEM at apex & base, w/o r/g;  Abd- soft, neg;  Ext- left ankle swelling  EKG 12/2014 showed NSR, rate 63/min, Q in III, NSSTTWA, NAD...  2DEcho 02/08/15 showed mild LVH, norm LVF w/ EF=55-60%, no regional wall motion abn, Gr1DD, mod AoV calcif & mild AS/ trivAI, mild MR, mild LAdil, norm RVF & PAsys=29  CDopplers 03/22/15>  bilat heterogen plaque, 40-59% bilat ICAstenoses- stable; norm subclav & vertebrals...   CXR 03/23/15>  Heart at upper lim of norm & s/p CABG; underlying COPD w/ flatening of diaph & chr incr markings at bases; mild DDD in Tspine; NAD- s/p CABG & chronic changes...  LABS 03/23/15>  FLP- all parameters at goals on Lip10;  Chems- ok x BS=105, A1c=6.6, BUN=29, Cr=1.94, LFTs=wnl;  CBC- stable anemia w/ Hg=10.2, MCV=91; Fe=93 (30%sat), Ferritin=187, SPE/IEP- neg, no monoclonal prot... IMP/PLAN>>  Henry [name] is stable on his current regimen- continue same; reminded to be careful during the winter months; we plan ROV recheck in 54months...  ~  August 16, 2015:  36mo ROV & Henry [name] is c/o increased fatigue & bilat leg tightness w/ exertion (we discussed checking Art dopplers and ABIs); he also notes that his balance is off but no falls (we discussed a Neuro eval & poss PT rx but he declines and says he'll think about it);  We reviewed the following medical problems during today's office visit >>     HBP> on Metop25Bid, Amlod5, Losar100, Lasix40;  BP= 130/64 & he has persistent mild edema; denies CP, palpit, ch in SOB, etc...    ASHD> on ASA81; known CAD & s/p CABG 2000; followed by [name] & last seen 01/24/15- norm Myoview 7/14; he denies CP/ angina, palpit, SOB; needs to take Lasix20 daily  for the edema.    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    Periph Vasc Dis> on ASA81; he has stable claud w/ ambulation; last Art Dopplers 12/15 showed ABIs .79R & .71L, TBIs were abn but w/o risk of tissue loss; now c/o increased leg symptoms- ArtDopplers pending...    CHOL> on Lip20; Labs 1/16 showed TChol 164, TG 151, HDL 36, LDL 98... Needs better diet.  DM> on diet alone (off prev Metform); Labs 1/16 showed BS= 127; Labs 7/16 showed BS=116, A1c=6.3; we reviewed diet, exercise, etc....    Hypothyroid> on Synthroid50    Renal Insuffic> he knows to avoid NSAIDs; Labs 12/14 w/ Creat= 1.8 and Labs 1/16 showed Cr= 1.5; Labs 7/16 w/ Cr=2.0 & rec to incr water intake...    DJD, LBP w/ spinal stenosis> on Tramadol50 prn; followed by DrRamos=> DrBrooks at [facility]; they did MRI w/ report of sp stenosis severe at L4-5; surg 7/14 w/ lumbar decompression & fusion L4-5...    Anemia> on FeSO4 325mg /d; Labs 1/16 w/ Hg= 10.1 stable; Labs 7/16 showed Hg=10, Fe=57 (20%sat), Ferritin=109; continue same... EXAM shows Afeb, VSS, O2sat=100% on RA;  HEENT- neg, mallampati2;  Chest- bibasilar crackles ~1/3rd way up w/o w/r;  Heart- RR gr1/6 SEM at apex & base, w/o r/g;  Abd- soft, neg;  Ext- left ankle swelling  LABS 08/16/15>  Chems- ok w/ K=5.3, BS=107, A1c=6.3, BUN=37, Cr=2.17;  CBC- Hg=10.4, otherw ok...   ArtDopplers LEs 08/24/15>  Known bilat SFA disease- R-ABI=0.83 (mild), L-ABI=0.79 (mod); TBIs are R-0.65 & L-0.52...  IMP/PLAN>>  Henry [name] is 84 y/o w/ multisys dis, known ASHD & PVD, continue same meds and try to incr exercise; we discussed low sodium, elev legs, support hose, continue Lasix40; he will maintain f/u w/ Cards- [name]...  ~  January 30, 2016:  35mo [place] reports doing satis- no new complaints or  concerns;  He is still caring for wife (5) w/ alz dis at home, they have 5 children- 2 in [place], 1 in [place], 1 in [place], and 1 daugh in [place] Henry [name])... We reviewed the following medical problems during today's office visit >> Note: he gets his meds from the [place].    HBP> on Metop25Bid, Amlod10, Losar100, Lasix20;  BP= 110/70 & he has intermittent mild edema; denies CP, palpit, ch in SOB, etc...    ASHD> on ASA81; known CAD & s/p CABG 2000; followed by [name] & last seen 01/24/15- norm Myoview 7/14; he denies CP/ angina, palpit, SOB; needs to take Lasix20 daily for the edema.    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    Periph Vasc Dis> on ASA81; he has stable claud w/ ambulation ("tight in legs"); Art Dopplers 12/15 showed ABIs .79R & .71L, TBIs were abn but w/o risk of tissue loss; f/u ArtDopplers 07/2015 were stable: .83R & .79L w/ similar TBIs    CHOL> on Lip20-1/2 tab/d; Labs 12/17 showed TChol 138, TG 73, HDL 48, LDL 76... Continue same...    DM> on diet alone (off prev Metform); Labs 12/17 showed BS= 120; Labs 7/16 showed BS=116, A1c=6.3; we reviewed diet, exercise, etc....    Hypothyroid> on Synthroid47mcg/d w/ Labs 12/17 showing TSH=2.78    Renal Insuffic> he knows to avoid NSAIDs; Labs 12/14 w/ Creat= 1.8 and Labs 1/16 showed Cr= 1.5; Labs 7/16 w/ Cr=2.0 & rec to incr water intake & most recent lab 12/17 showed Cr=2.36...    DJD, LBP w/ spinal stenosis> on Tramadol50 prn; followed by DrRamos=> DrBrooks at [facility]; they did MRI w/ report of sp stenosis severe at L4-5; surg 7/14 w/ lumbar decompression & fusion L4-5...    Anemia> on FeSO4 325mg /d, MVI & AREDS2; Labs 1/16 w/ Hg= 10.1 stable; Labs  7/16 showed Hg=10, Fe=57 (20%sat), Ferritin=109; most recent lab 12/17 showed Hg=  9.7.Marland KitchenMarland Kitchen EXAM shows Afeb, VSS, O2sat=100% on RA;  HEENT- neg, mallampati2;  Chest- bibasilar crackles ~1/3rd way up w/o w/r;  Heart- RR gr1/6 SEM at apex & base, w/o r/g;  Abd- soft, neg;  Ext- left ankle swelling  LABS 01/30/16>  FLP- all parameters at goals on Lip20-1/2 tab Qd;  Chems- ok x BS=120, BUN=42, Cr=2.36;  CBC- anemic w/ Hg=9.7;  TSH=2.78;  BNP=355  IMP/PLAN>>  Henry [name] is stable w/ multisystem dis, continue same meds and regular clinical follow up  ~  July 30, 2016:  65mo ROV & Henry [name] relates incr stress at home caring for wife w/ alz; his legs remain stiff & get "tight" w/ exercise; he is managing remarkably well at 89 w/ multisystem disease... 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    HBP> on Metop25Bid, Amlod10, Losar100, Lasix20;  BP= 110/70 & he has intermittent mild edema; denies CP, palpit, ch in SOB, etc...    ASHD> on ASA81; known CAD & s/p CABG 2000; followed by [name] & last seen 01/24/15- norm Myoview 7/14; he denies CP/ angina, palpit, SOB; on Lasix20 daily for the edema => we will arrange for CARDS f/u appt.    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    Periph Vasc Dis> on ASA81; he has stable claud w/ ambulation ("tight in legs"); Art Dopplers 12/15 showed ABIs .79R & .71L, TBIs were abn but w/o risk of tissue loss; f/u ArtDopplers 07/2015 were stable: .83R & .79L w/ similar TBIs    CHOL> on Lip20-1/2 tab/d; Labs 12/17 showed TChol 138, TG 73, HDL 48, LDL 76... Continue same...    DM> on diet alone (off prev Metform); Labs 12/17 showed BS= 120; Labs 7/16 showed BS=116, A1c=6.3; we reviewed diet,  exercise, etc....    Hypothyroid> on Synthroid79mcg/d w/ Labs 12/17 showing TSH=2.78    Renal Insuffic> he knows to avoid NSAIDs; Labs 12/14 w/ Creat= 1.8 and Labs 7/16 w/ Cr=2.0 & rec to incr water intake => Labs 12/17 w/ Cr=2.36 & today Labs showed Cr=2.54 => we will refer to NEPHROLOGY for input.    DJD, LBP w/ spinal stenosis> prev on Tramadol50 prn; followed by DrRamos=> DrBrooks at [facility]; they did MRI w/ report of sp stenosis severe at L4-5; surg 7/14 w/ lumbar decompression & fusion L4-5...    Anemia> on FeSO4 325mg /d, MVI & AREDS2; Labs 1/16 w/ Hg= 10.1 stable; Labs 7/16 showed Hg=10, Fe=57 (20%sat), Ferritin=109; Lab 12/17 w/ Hg= 9.7, and Labs today showed Hg=10.7 c/w ACD-renal...    Anxiety> under stress caring for wife w/ Alz, fatigue, c/o legs feel tight... EXAM shows Afeb, VSS, O2sat=100% on RA;  HEENT- neg, mallampati2;  Chest- bibasilar crackles ~1/3rd way up w/o w/r;  Heart- RR gr1/6 SEM at apex & base, w/o r/g;  Abd- soft, neg;  Ext- left ankle tr swelling  CXR 03/07/16>  Coarse interstitial markings at bases (no change), no airsp dis, norm heart size, atherosclerotic Ao, prev CABG, DJD Tspine.  CDopplers 03/21/16>  bilat heterogen plaque, 1-39% bilat ICAstenoses (improved  from 2017)... Continue same meds.  Art Dopplers of LEs w/ ABIs> sched & pending 07/2016  LABS 07/30/16>  Chems- BS=114, BUN=42, Cr=2.54, K=4.6;  CBC- Hg=10.7, otherw ok (eos=5.5%)... He needs Nephrology referral w/ slow steady rise in Cr... IMP/PLAN>>  Henry [name] is stable w/ his severe dis at age 54, still caring for his wife w/ AlzDis;  His Creat continues to slowly rise despite BP control/ ARB rx/ no NSAIDs/ etc=> we will refer to Nephrology for their suggestions;  He has anemia of chr renal dis as well but appears stable at 10.7 today;  He is overdue for CARDS f/u w/ [name] but infact appears stable- recall that he gets meds from the [place] & is seen there Q11mo as well...  ~  January 22, 2017:  81mo ROV & Henry [name]  reports a good 9mo interval- feeling well & he denies CP, palpit, dizziness, edema; his SOB/ DOE is the same (eg- w/ yard work or rushing) & he handles stairs at home OK;  He even notes more energy w/ his med adjustments; also denies cough, sput, hemoptysis;  His biggest concern is for his renal function>>     He saw Nephrology- DrColadonato 11/08/16>  Note reviewed-- CRD- stage4, secondary to DM & HBP, w/ gradual worsening over 40yrs;  Hx CAD-s/pCABG & PVD;  He gets monthly EPO injections at the [facility];  He knows not to use NSAIDs or Cox-2s;  On Lasix for edema in LEs;  On Losartan100 for many yrs;  Sonar w/ echogenic kidneys c/w medical renal dis;  Neg SPEP/UPEP & subnephrotic proteinuria...  LABS 9/13> Cr=2.57, chems- wnl, UA- neg x 2+prot, Hg=10.6... They rec BP control <130/80, A1c goal <7, continue ARB except in presence of vol depletion (n/v/d), avoid nephrotoxic meds    He saw CARDS- [name] 10/25/16>  Hx HBP, CAD- s/p CABG, PAD- w/ intermittent claud, DM, HL, CKD... No CP or pressure, denied SOB, notes min edema & leg discomfort w/ ambulation; they performed f/u ABIs and 2DEcho.  EKG 10/25/16>  NSR, rate61, 1st degree AVB, infer Qs, NAD...  LE Art Dopplers 11/01/16>  Right ABI=0.82 (mild & stable) and Left ABI=0.59 (sl decr & in the mod range); bilat abnormal TBIs;  They rec referral for PV consult...  2DEcho 10/31/16>  Norm LV wall thickness & norm LVF w/ EF=55-60%, some basal infer HK, otherw no regional wall motion abn, Gr1DD, mod calcif AoV leaflets w/ mild to mod AS (peak grad=28, mean grad=17), mildly enlarged Ao root & asc ao37-9mm, mild MR, norm RV w/ PAsys=27    He had PV consult- DrArida 12/25/16>  Pt c/o claud after walking ~1/2 block, has to stop & rest; ABIs notes- distal pulses not palpable, pt did not want contrast for arteriogram, DrArida started Pletal trial (50mg  Bid) & is checking Ao-iliac Duplex...    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    HBP> on Metop25Bid, Amlod10, Losar100, Lasix40;  BP= 130/58 & he has intermittent mild edema; denies CP, palpit, ch in SOB, etc...    ASHD> on ASA81; known CAD & s/p CABG 2000; followed by [name] & last seen 8/18- norm Myoview 7/14; he denies CP/ angina, palpit, SOB; on Lasix40 daily now for the edema, continue same meds...    Aortic Stenosis>  He has  2DEcho 04/30/16 by [name] w/ norm LV size/ wall thickness/ function w/ EF=55-60%, some basilar inferior HK, Gr1DD, mod calcif AoV leaflets w/ mild-mod AS/ triv regurg (peak grad 38mmHg), mod enlarged Ao root, mild MR, norm RV, PAsys=75mmHg.    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    Periph Vasc Dis> on ASA81; he has stable claud w/ ambulation ("tight in legs"); Art Dopplers 9/18 showed ABIs .82R & .59L, TBIs were abn but w/o risk of tissue loss;  He was referred to DrArida for PV eval & duplex pending...    CHOL> on Lip20-1/2 tab/d; Labs 11/18 showed TChol 164, TG 110, HDL 45, LDL 97... Continue same med + better diet...    DM> on diet alone (off prev Metform); Labs 11/18 showed BS= 113 & A1c=6.6; we reviewed diet, exercise, etc....    Hypothyroid> on Synthroid60mcg/d w/ Labs 11/18 showing TSH=5.16 & reminded to take it every day, 1st thing in the AM, on empty stomach & don't eat for 45 min...    Renal Insuffic> he knows to avoid NSAIDs; Labs 12/14 w/ Creat= 1.8 and Labs 7/16 w/ Cr=2.0 & rec to incr water intake => Labs 11/18 w/ Cr= 2.68 & he is followed by [facility] now...    DJD, LBP w/ spinal stenosis> prev on Tramadol50- off now, just Tylenol; followed by DrRamos=> DrBrooks at [facility]; they did MRI w/  report of sp stenosis severe at L4-5; surg 7/14 w/ lumbar decompression & fusion L4-5...    Anemia> on FeSO4 325mg /d, MVI & AREDS2; Labs 1/16 w/ Hg= 10.1 stable; Labs 7/16 showed Hg=10, Fe=57 (20%sat), Ferritin=109; Lab 11/18 w/ Hg= 11.1 on EPO monthly at the [place]...    Anxiety> under stress caring for wife w/ Alz, fatigue, c/o legs feel tight... EXAM shows Afeb, VSS, O2sat=98% on RA;  HEENT- neg, mallampati2;  Chest- bibasilar crackles ~1/3rd way up w/o w/r;  Heart- RR gr1/6 SEM at apex & base, w/o r/g;  Abd- soft, neg;  Ext- left ankle tr swelling  CXR 01/22/17 (independently reviewed by me in the PACS system) showed borderline heart size, s/p CABG, lungs show some chr changes w/ basilar scarring/ fibrosis & we will continue to follow...  LABS 01/22/17>  FLP ok on Lip10;  Chems- BS=113, A1c=6.6, Cr=2.68;  CBC- Hg=11.1;  TSH=5.16 on 3mcg/d;  VitD=35... IMP/PLAN>>  As above- we continue to follow Pulm/ general medical; he sees [name] for Cards, DrArida for PV, DrColadonato for Renal, & [facility] [place];  Reminded to take meds regularly & the Synthroid by itself on empty stomach etc;  Asked to remain as active as poss & call for any interval problems...   ~  [name] 29, 2019:  99mo ROV & Henry [name] continues to do amazingly well for 84 y/o w/ his multisystem disease... He had a fall in Mar2019 (fell down stairs backwards) w/ signif injury including concussion, several broken ribs- eval by Ortho, DrGioffre (see below);  He reports that he has made a full recovery & doing well now;  Says his breathing is good- denies cough/ sputum/ SOB/ CP/ palpit/ edema... We reviewed the following interval medical visits in Epic-EMR>      He continues to follow regularly at the [place] in [place]>  We do not have records...    He saw CARDS/PV- DrArida on 03/26/17>  PAD w/  chr claudication, cardiac problems as noted, CKD4; ABIs w/ worsening dis but given his mult other issues they decided to treat w/ Pletal 100mg Bid=> he reports sl  improvement in symptoms...    He saw Manson Passey- DrGioffre 04/2017>    Bilat Rib detail films 05/07/17 in Ortho office>  C/o right rib pain after fall at home, hematoma noted; right rib fx reported by DrGioffre (XRay report not included w/ fax records), given Oxy5 for pain...  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    He saw PODIATRY- DrMayer on 07/10/17>  Onychomycosis and DM w/ neuropathy; pulses ok, nails trimmed & he is followed Q24mo... 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    HBP> on Metop25Bid, Amlod10, , Lasix40;  BP= 138/66 & he has intermittent mild edema; denies CP, palpit, ch in SOB, etc...    ASHD> on ASA81; known CAD & s/p CABG 2000; followed by [name] & last seen 8/18- norm Myoview 7/14; he denies CP/ angina, palpit, SOB; on Lasix40 daily now for the edema, continue same meds...    Aortic Stenosis>  He has 2DEcho 04/30/16 by [name] w/ norm LV size/ wall thickness/ function w/ EF=55-60%, some basilar inferior HK, Gr1DD, mod calcif AoV leaflets w/ mild-mod AS/ triv regurg (peak grad 68mmHg), mod enlarged Ao root, mild MR, norm RV, PAsys=2mmHg.    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    Periph Vasc Dis> on ASA81 & Pletal100Bid; he has stable claud w/ ambulation ("tight in legs"); Art Dopplers 9/18 showed ABIs .82R & .59L, TBIs were abn but w/o risk of tissue loss;  He was referred to DrArida for PV eval, conservative management on Pletal.    CHOL> on Lip20-1/2 tab/d; Labs 11/18 showed TChol 164, TG 110, HDL 45, LDL 97... Continue same med + better diet...    DM> on diet alone (off prev Metform); Labs 11/18 showed BS= 113 & A1c=6.6; we reviewed diet, exercise, etc....    Hypothyroid> on Synthroid59mcg/d w/ Labs 11/18 showing TSH=5.16 & reminded to take it every day, 1st thing in the AM, on empty stomach & don't eat for 45 min...    Renal Insuffic> he knows to avoid NSAIDs; Labs 12/14 w/ Creat= 1.8 and Labs 7/16 w/ Cr=2.0 & rec to incr water intake => Labs 11/18 w/ Cr= 2.68 & he is followed by DrColadonato now => Cr up to 3.84 & preparing for dialysis management.    DJD, LBP w/ spinal stenosis> prev on Tramadol50- off now, just Tylenol; followed by DrRamos=> DrBrooks at [facility];  they did MRI w/ report of sp stenosis severe at L4-5; surg 7/14 w/ lumbar decompression & fusion L4-5...    Anemia> on FeSO4 325mg /d, MVI & AREDS2; Labs 1/16 w/ Hg= 10.1 stable; Labs 7/16 showed Hg=10, Fe=57 (20%sat), Ferritin=109; Lab 11/18 w/ Hg= 11.1 on EPO monthly at the [place]...    Anxiety> under stress caring for wife w/ Alz, fatigue, c/o legs feel tight... EXAM shows Afeb, VSS, O2sat=97% on RA;  HEENT- neg, mallampati2;  Chest- bibasilar crackles ~1/3rd way up w/o w/r;  Heart- RR gr1/6 SEM at apex & base, w/o r/g;  Abd- soft, neg;  Ext- left ankle tr swelling IMP/PLAN>>  Henry [name] has advancing renal failure, GFR down to 13 & Nephrology is preparing for dialysis;  CV & PV management is conservative given his severe multisystem dis at age 64;  He still goes to the [facility] [place] periodically but we  don't have records;  We broached future management w/ him today, no changes in meds at this time...           Problem List:  HYPERTENSION (ICD-401.9) >>  ~  controlled on 4 meds:  METOPROLOL 25mg Bid, AMLODIPINE 10mg /d, LOSARTAN 25mg /d, and HCT 25mg - 1/2 qd...  ~  [place] showed mild LVH, AoV sclerosis & mild AI, EF= 60%... ~  labs 11/11 showed BUN=36, Creat=2.1, K=5.1.Marland KitchenMarland Kitchen rec to stop HCTZ... ~  [facility] ([facility] subseq re-started 12.5mg  HCTZ in the interim due to edema... ~  6/14:  on Metop25/d, Amlod10, Losar25, Lasix20;  BP= 160/64 but he is in pain & ?took his meds today; he denies CP, palpit, ch in SOB, edema, etc; asked to incr Metop25Bid.  ~  CXR 6/14 showed norm heart size, atherosclerotic & tort Ao, emphysematous changes in upper lobes & scarring in lower lobes, DJD in spine, NAD... ~  12/14: on Metop25Bid, Amlod10, Losar25-3/d, Lasix20;  BP= 144/60 & he denies CP, palpit, ch in SOB, edema, etc. ~  7/15: BP controlled on Metop25Bid, Amlod10, Losar25-3/d, Lasix20;  BP= 120/74 7 he denies CP, palpit, dyspnea, etc; Labs showed K=4.7, BUN=25, Cr=1.8 stable. ~  1/16: on Metop25Bid, Amlod5, Losar100, Lasix20 (prn);  BP= 124/68 & he has persistent edema; denies CP, palpit, ch in SOB, etc. ~  7/16: on Metop25Bid, Amlod5, Losar100, Lasix20 (prn);  BP= 114/56 & he remains stable...  ARTERIOSCLEROTIC HEART DISEASE (ICD-414.00) - on ASA 81mg /d... long time pt of DrJoeLeBauer & followed by DrBrodie now... s/p CABG in 2000 by DrBartle (for L main dis)... he is active & w/o angina, palpit, change in SOB/ DOE, etc... ~  NuclearStressTest 9/08 was normal without ischemia and EF=68%... ~  12/13: he saw [name] for f/u CAD, s/p CABG, PAD, Carotid Art dis> no angina, bilat calf pain w/ ambulation but felt to be stable; he adjusted meds, but [facility] changed [facility] after that... ~  EKG 6/14 showed SBrady, rate56, 1st degree AVB, otherw wnl... ~  7/14:  Myoview showed normal wall motion & EF=61%, no CP or EKG  changes... 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 ~  1/16: on ASA81; known CAD & s/p CABG 2000; followed by [name] & last seen 12/15- norm Myoview 7/14; he denies CP/ angina, palpit, SOB; needs to take Lasix20 daily for the edema.  CAROTID ARTERY DISEASE>  PERIPHERAL VASCULAR DISEASE (ICD-443.9) - on ASA daily and he has bilat femoral bruits... he has seen  DrDowney in the past (now [name]) w/ exerc program recommended... he walks regularly w/ stable claud symptoms, no sores or lesions on LE's... ~  ABI's 10/08 were normal bilat = 1.0.Marland KitchenMarland Kitchen waveforms were biphasic however (no change from 2005). ~  CDoppler's 10/08 showed mild bilat carotid dis w/ 40-59% RICA stenosis, and 8-76% LICA stenosis. ~  CDoppler's 10/09 showed the same= stable. ~  CDoppler's 11/10 showed stable mild carotid dis bilat> 81-15% RICA & 7-26% LICA stenoses. ~  ABI's 11/10 showed prob mod severe >50% distal right SFA stenosis, & mod dis in left SFA, ABI's=0.9 ~  Podiarty sent him to [facility] for ArtDopplers 9/11> ABIs 0.82 R & 0.74L w/ bilat SFA dis L>R, toe pressures adeq for healing... ~  6/14: on ASA81; he denies cerebral ischemic symptoms; CT/ MRI Brain 2/14 showed sm vessel dis, vasc calcif, global atrophy (no hemorrhage or large area infarct); last CDoppler 1/13 showed mild heterogeneous plaque bilat w/ 40-59% bilat ICAstenoses... ~  6/14: on ASA81; he has stable claud w/ ambulation but lim by back pain now; last Art Dopplers 1/13 showed ABIs .92R & .80L, TBIs were abn but w/o risk of tissue loss; overdue for yearly f/u studies of Carotids & ABIs... ~  09/27/22: He has ASHD & ASPVD on ASA81; as noted he is active & denies angina etc; he saw [name] 12/14 & stable, continue medical therapy & exercise. ~  CDoppler 1/16 showed mild heterogeneous plaque bilat w/ 40-59% bilat ICAstenoses & norm subclav/ antegrade  verts.  HYPERCHOLESTEROLEMIA (ICD-272.0) - prev controlled on Simva40 + diet, the [facility] CRESTOR 10mg /d. ~  [place] 2/09 on Simva40 showed Tchol 169, TG 206, HDL 37, LDL 96 ~  FLP 4/10 on Simva40 showed TChol 146, TG 145, HDL 36, LDL 82 ~  FLP 10/10 on Simva40 showed TChol 166, TG 130, HDL 38, LDL 102 ~  FLP 4/11 on Simva40 showed TChol 138, TG 87, HDL 40, LDL 80... [facility] subseq ch to Cres10 ~  FLP 11/11 on Cres10 showed TChol 147, TG 161, HDL 34, LDL 81 ~  6/12:  He indicates interval labs done by the [facility] but he didn't bring copies for [place] to review... ~  6/14: on Lip20 (off prev Crestor); Labs done at [facility] but he didn't bring reports; he is not fasting today for recheck.  ~  [place] 12/14 on Lip20 showed TChol 127, TG 56, HDL 41, LDL 75  ~  FLP 1/16 on Lip20 showed TChol 164, TG 151, HDL 36, LDL 98  DIABETES MELLITUS (ICD-250.00) - he's struggled w/ diet Rx... wt stable ~175 range... prev on Metformin 500Bid but [facility] changed to GLYBURIDE 5mg /d, then decr to 1/2 tab daily due to side effects & he stopped due to hypogly. ~  labs 10/08 showed FBS= 134, and HgA1c= 5.4... ~  labs 6/09 showed BS= 101, HgA1c= 6.4.Marland KitchenMarland Kitchen monofilament test is normal. ~  Eye eval 1/10 at [facility] showed no DM retinopathy... ~  labs 4/10 on MetformBid showed BS= 106, A1c= 6.8, Urine Microalb= sl incr @ 7.8mg % ~  labs 10/10 showed BS= 119, A1c= 6.5...  NOTE: 2/11 [facility] ch to Glybur5mg - 1/2 daily. ~  labs 4/11 on Glyb2.5 showed BS= 97, A1c= 6.6.Marland KitchenMarland Kitchen pt subseq stopped Glybur due to low sugars. ~  labs 11/11 off meds showed BS= 149, A1c= 6.7.Marland KitchenMarland Kitchen coninue diet Rx. ~  6/12:  He indicates interim labs done by the [facility] but he didn't bring copies for [place] to review... ~  6/14: on diet alone (off prev Metform); Labs 2/14 Hosp w/ BS= 150-200+ range; now BS=132, A1c=6.6; he follows low carb diet ~  Labs 12/14 on diet alone (weight up 13# to 173#) showed BS= 131, A1c= 6.9 ~  7/15: He has DM controlled on diet alone;weight stable at 171#,  BMI=25-6; Labs today showed BS=129, A1c=6.5; continue diet management. ~  1/16: on diet alone (off prev Metform); Labs 12/14 showed BS= 131, A1c=6.9; Labs 1/16 showed BS= 127; we reviewed diet, exercise, etc ~  7/16:  On diet alone> Labs 7/16 showed BS=116, A1c=6.3; we reviewed diet, exercise, etc...  BORDERLINE TSH >> ~  Serial labs showed rising TSH> 3.57 => 5.84 => 8.74 (12/14) and we decided to start Synthroid22mcg/d;  Somehow the [facility] told him to stop this med & he did not call to check w/ [place]... ~  7/15  Follow up TSH= 3.50 not on meds, clinically euthyroid, good energy, etc;  we will follow closely...  ~  1/16:  Labs here 1/16 showed TSH= 9.10 & he is requested to restart the Levothy74mcg/d... ~  7/16: now on Levothy50 every day> Labs 7/16 showed TSH= 3.62, continue same.  RENAL INSUFFICIENCY (ICD-588.9) - Creat 1.8-2.0 in the past... ~  labs 6/09 showed BUN= 31, Creat= 1.6... ~  labs 9/09 showed BUN= 25, Creat= 1.6, K= 4.5 ~  labs 4/10 showed BUN= 26, Creat= 1.6, K= 5.0 ~  labs 10/10 showed BUN= 21, Creat= 1.6 ~  labs 4/11 showed BUN= 30, Creat= 1.9..Marland Kitchen advised to stay well hydrated. ~  labs 11/11 showed BUN= 36, Creat= 2.1.Marland KitchenMarland Kitchen rec to stop the HCTZ ~  6/12:  He indicates that the [facility] restarted HCTZ 12.5mg /d & they are following labs... ~  6/14: he knows to avoid NSAIDs; Labs 2/14 w/ Creat= 1.4-1.6 range; Labs today showed BUN= 21, Creat= 1.7... ~  Labs 12/14 showed BUN= 23, Creat= 1.8 ~  Labs 7/15 showed BUN= 25, Cr= 1.8 ~  Labs 1/16 showed BUN= 25, Cr= 1.5 ~  Labs 7/16 showed BUN= 28, Cr= 2.0  DEGENERATIVE JOINT DISEASE (ICD-715.90) LOW BACK PAIN >> he has severe sp stenosis L4-5 per MRI at [facility] 6/14... ~  Notes and Records fro [facility]- DrRamos, DrBrooks> reviewed, they plan surgery if cleared fro CV standpoint...  Hx of ANEMIA OF CHRONIC DISEASE (ICD-285.29) - eval by DrGranfortuna w/ ACD secondary to his renal insuffic and Rx w/ aranesp periodically... nothing else  found on his extensive eval... he was taking Ferrous Gluconate daily (it no longer makes him tired)... ~  labs 2/09 w/ Hg= 11.2.Marland Kitchen. ~  labs 6/09 w/ Hg= 11.0.Marland Kitchen. rec> OTC Fe prep but he never got it! ~  labs 9/09 showed Hg= 10.5, Fe= 81 ~  labs 4/10 showed Hg= 10.5, MCV= 92, Fe= 77 (22%sat), B12= 590, Folate>20... rec> OTC Fe daily. ~  labs 10/10 showed Hg= 10.3, Fe= 88 ~  labs 4/11 showed Hg= 10.9,  Fe= 58... continue Ferrous Gluc daily w/ VitC ~  labs 11/11 showed Hg= 11.6 ~  6/12:  Again he indicated that the [facility] is following his labs, aske to bring copies for [place] to review. ~  6/14: on FeSO4 325mg /d; Labs 2/14 w/ Hg= 10-11 range; Labs now show Hg= 11.3 ~  Labs 12/14 showed Hg= 10.0 ~  7/15: He has a chronic anemia & f/u labs today showed Hg=10.4 (stable) w/ MCV=91, Fe=84 (27%), Ferritin 104; he continues on MVI, Fe, etc. ~  1/16: on FeSO4  325mg /d; Labs 1/16 w/ Hg= 10.1 stable. ~  7/16: on Fe daily> Labs showed Hg=10, Fe=57 (20%sat), Ferritin=109, continue same...   Past Surgical History:  Procedure Laterality Date  . cataract surgery    . COLONOSCOPY     Hx: of  . CORONARY ARTERY BYPASS GRAFT  2000   by Dr. Cyndia Bent  . LUMBAR LAMINECTOMY/DECOMPRESSION MICRODISCECTOMY N/A 09/10/2012   Procedure: LUMBAR DECOMPRESSION,  IN SITU FUSION LUMBAR 4-5;  Surgeon: Melina Schools, MD;  Location: [place];  Service: Orthopedics;  Laterality: N/A;  . TONSILLECTOMY      Outpatient Encounter Medications as of 07/24/2017  Medication Sig  . amLODipine (NORVASC) 10 MG tablet Take 10 mg by mouth daily.   Marland Kitchen aspirin EC 81 MG tablet Take 81 mg by mouth every morning.   Marland Kitchen atorvastatin (LIPITOR) 20 MG tablet Take 10 mg by mouth every evening.   . Calcium Carbonate-Vitamin D (CALCIUM 600/VITAMIN D PO) Take 1 tablet by mouth 2 (two) times daily.   . Carboxymethylcellulose Sodium (EYE DROPS OP) Apply to eye. OTC Ared 2 Presser Vision- takes each morning  . cilostazol (PLETAL) 100 MG tablet Take 1 tablet (100  mg total) by mouth 2 (two) times daily.  . ferrous gluconate (FERGON) 325 MG tablet Take 325 mg by mouth 2 (two) times daily.   . furosemide (LASIX) 40 MG tablet Take 40 mg by mouth daily.  Marland Kitchen glucose blood (ACCU-CHEK AVIVA PLUS) test strip CHECK BLOOD SUGAR TWICE A DAY.  Marland Kitchen Lancets (ACCU-CHEK MULTICLIX) lancets USE TWICE DAILY.  Marland Kitchen levothyroxine (SYNTHROID, LEVOTHROID) 50 MCG tablet TAKE ONE TABLET BY MOUTH ONCE DAILY BEFORE BREAKFAST  . metoprolol tartrate (LOPRESSOR) 25 MG tablet Take 1 tablet (25 mg total) by mouth 2 (two) times daily.  . Multiple Vitamins-Minerals (CENTRUM SILVER PO) Take 1 tablet by mouth every morning.   . Multiple Vitamins-Minerals (PRESERVISION AREDS 2 PO) Take 1 tablet by mouth every evening.  . [DISCONTINUED] losartan (COZAAR) 100 MG tablet Take 100 mg by mouth daily.   No facility-administered encounter medications on file as of 07/24/2017.     No Known Allergies   Immunization History  Administered Date(s) Administered  . Influenza Split 04/07/2012, 12/25/2012  . Influenza Whole 12/09/2007, 12/07/2008, 12/05/2009  . Influenza, High Dose Seasonal PF 10/31/2015, 11/22/2016  . Influenza, Seasonal, Injecte, Preservative Fre 03/15/2014  . Influenza,inj,Quad PF,6+ Mos 12/28/2014  . Tdap 01/29/2011    Current Medications, Allergies, Past Medical History, Past Surgical History, Family History, and Social History were reviewed in [employer] record.   Review of Systems        See HPI - all other systems neg except as noted... The patient complains of decreased hearing, dyspnea on exertion, and difficulty walking.  The patient denies anorexia, fever, weight loss, weight gain, vision loss, hoarseness, chest pain, syncope, peripheral edema, prolonged cough, headaches, hemoptysis, abdominal pain, melena, hematochezia, severe indigestion/heartburn, hematuria, incontinence, muscle weakness, suspicious skin lesions, transient blindness, depression,  unusual weight change, abnormal bleeding, enlarged lymph nodes, and angioedema.     Objective:   Physical Exam     WD, WN, 84 y/o WM in NAD... GENERAL:  Alert & oriented; pleasant & cooperative... HEENT:  [place]/AT, EOM-wnl, PERRLA, EACs-clear, TMs-wnl, NOSE-clear, THROAT-clear & wnl. NECK:  Supple w/ fairROM; no JVD; normal carotid impulses w/ faint right bruit; no thyromegaly or nodules palpated; no lymphadenopathy. CHEST:  Clear to P & A; without wheezes/ rales/ or rhonchi heard... HEART:  Regular, gr 1/6 SEM without rubs  or gallops heard... ABDOMEN:  Soft & nontender; normal bowel sounds; no organomegaly or masses detected. EXT: without deformities, mod arthritic changes; no varicose veins/ venous insuffic/ or edema. he has bilat fem art bruits & minor pressure area betw right 4th & 5th toes... NEURO:  CN's intact;  no focal neuro deficits... DERM:  Lesion on medial side of right small toe, sl tender...  RADIOLOGY DATA:  Reviewed in the EPIC EMR & discussed w/ the patient...  LABORATORY DATA:  Reviewed in the EPIC EMR & discussed w/ the patient...   Assessment & Plan:    07/24/17>   Henry [name] has advancing renal failure, GFR down to 13 & Nephrology is preparing for dialysis;  CV & PV management is conservative given his severe multisystem dis at age 86;  He still goes to the [place] periodically but we don't have records;  We broached future management w/ him today, no changes in meds at this time.   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  HBP>  BP is OK on the current regimen; continue same; he is also followed by the [place]...  ASHD/ CAD>  Followed by [name] for [facility] & the [facility]; continue current meds...  ASPVD- Carotid art dis & PAD>  Decr ABIs & Carotid dis> on ASA 81mg /d; followed by [name] et al...  CHOL>  On Lipitor20-taking 1/2 tab and FLP is at goals...  DM>  On diet alone & labs also followed by there [facility]; labs today showed BS~120 & prev A1c=6.3.Marland KitchenMarland Kitchen  Renal  Insuffic>  Creat stable in the 2.3 range on current meds & he knows to avoid NSAIDs.Marland KitchenMarland Kitchen  Anemia>  Hg seems stable in the 10 range w/ ACD...   Patient's Medications  New Prescriptions   No medications on file  Previous Medications   AMLODIPINE (NORVASC) 10 MG TABLET    Take 10 mg by mouth daily.    ASPIRIN EC 81 MG TABLET    Take 81 mg by mouth every morning.    ATORVASTATIN (LIPITOR) 20 MG TABLET    Take 10 mg by mouth every evening.    CALCIUM CARBONATE-VITAMIN D (CALCIUM 600/VITAMIN D PO)    Take 1 tablet by mouth 2 (two) times daily.    CARBOXYMETHYLCELLULOSE SODIUM (EYE DROPS OP)    Apply to eye. OTC Ared 2 Presser Vision- takes each morning   CILOSTAZOL (PLETAL) 100 MG TABLET    Take 1 tablet (100 mg total) by mouth 2 (two) times daily.   FERROUS GLUCONATE (FERGON) 325 MG TABLET    Take 325 mg by mouth 2 (two) times daily.    FUROSEMIDE (LASIX) 40 MG TABLET    Take 40 mg by mouth daily.   GLUCOSE BLOOD (ACCU-CHEK AVIVA PLUS) TEST STRIP    CHECK BLOOD SUGAR TWICE A DAY.   LANCETS (ACCU-CHEK MULTICLIX) LANCETS    USE TWICE DAILY.   LEVOTHYROXINE (SYNTHROID, LEVOTHROID) 50 MCG TABLET    TAKE ONE TABLET BY MOUTH ONCE DAILY BEFORE BREAKFAST   METOPROLOL TARTRATE (LOPRESSOR) 25 MG TABLET    Take 1 tablet (25 mg total) by mouth 2 (two) times daily.   MULTIPLE VITAMINS-MINERALS (CENTRUM SILVER PO)    Take 1 tablet by mouth every morning.    MULTIPLE VITAMINS-MINERALS (PRESERVISION AREDS 2 PO)    Take 1 tablet by mouth every evening.  Modified Medications   No medications on file  Discontinued Medications   LOSARTAN (COZAAR) 100 MG TABLET    Take 100 mg by mouth daily.

## 2017-07-31 DIAGNOSIS — E1122 Type 2 diabetes mellitus with diabetic chronic kidney disease: Secondary | ICD-10-CM | POA: Diagnosis not present

## 2017-07-31 DIAGNOSIS — N179 Acute kidney failure, unspecified: Secondary | ICD-10-CM | POA: Diagnosis not present

## 2017-07-31 DIAGNOSIS — I739 Peripheral vascular disease, unspecified: Secondary | ICD-10-CM | POA: Diagnosis not present

## 2017-07-31 DIAGNOSIS — I129 Hypertensive chronic kidney disease with stage 1 through stage 4 chronic kidney disease, or unspecified chronic kidney disease: Secondary | ICD-10-CM | POA: Diagnosis not present

## 2017-07-31 DIAGNOSIS — R809 Proteinuria, unspecified: Secondary | ICD-10-CM | POA: Diagnosis not present

## 2017-07-31 DIAGNOSIS — N184 Chronic kidney disease, stage 4 (severe): Secondary | ICD-10-CM | POA: Diagnosis not present

## 2017-07-31 DIAGNOSIS — D631 Anemia in chronic kidney disease: Secondary | ICD-10-CM | POA: Diagnosis not present

## 2017-08-13 DIAGNOSIS — H353221 Exudative age-related macular degeneration, left eye, with active choroidal neovascularization: Secondary | ICD-10-CM | POA: Diagnosis not present

## 2017-10-16 DIAGNOSIS — H353221 Exudative age-related macular degeneration, left eye, with active choroidal neovascularization: Secondary | ICD-10-CM | POA: Diagnosis not present

## 2017-10-30 ENCOUNTER — Ambulatory Visit (HOSPITAL_COMMUNITY): Payer: Medicare Other | Attending: Cardiovascular Disease

## 2017-10-30 ENCOUNTER — Other Ambulatory Visit: Payer: Self-pay

## 2017-10-30 DIAGNOSIS — I10 Essential (primary) hypertension: Secondary | ICD-10-CM | POA: Diagnosis not present

## 2017-10-30 DIAGNOSIS — E785 Hyperlipidemia, unspecified: Secondary | ICD-10-CM | POA: Insufficient documentation

## 2017-10-30 DIAGNOSIS — E119 Type 2 diabetes mellitus without complications: Secondary | ICD-10-CM | POA: Insufficient documentation

## 2017-10-30 DIAGNOSIS — I2510 Atherosclerotic heart disease of native coronary artery without angina pectoris: Secondary | ICD-10-CM | POA: Diagnosis not present

## 2017-10-30 DIAGNOSIS — I350 Nonrheumatic aortic (valve) stenosis: Secondary | ICD-10-CM | POA: Diagnosis not present

## 2017-10-30 DIAGNOSIS — Z951 Presence of aortocoronary bypass graft: Secondary | ICD-10-CM | POA: Diagnosis not present

## 2017-11-01 DIAGNOSIS — N179 Acute kidney failure, unspecified: Secondary | ICD-10-CM | POA: Diagnosis not present

## 2017-11-01 DIAGNOSIS — I129 Hypertensive chronic kidney disease with stage 1 through stage 4 chronic kidney disease, or unspecified chronic kidney disease: Secondary | ICD-10-CM | POA: Diagnosis not present

## 2017-11-01 DIAGNOSIS — R809 Proteinuria, unspecified: Secondary | ICD-10-CM | POA: Diagnosis not present

## 2017-11-01 DIAGNOSIS — E1122 Type 2 diabetes mellitus with diabetic chronic kidney disease: Secondary | ICD-10-CM | POA: Diagnosis not present

## 2017-11-01 DIAGNOSIS — D631 Anemia in chronic kidney disease: Secondary | ICD-10-CM | POA: Diagnosis not present

## 2017-11-01 DIAGNOSIS — I739 Peripheral vascular disease, unspecified: Secondary | ICD-10-CM | POA: Diagnosis not present

## 2017-11-01 DIAGNOSIS — N184 Chronic kidney disease, stage 4 (severe): Secondary | ICD-10-CM | POA: Diagnosis not present

## 2017-11-05 ENCOUNTER — Other Ambulatory Visit: Payer: Self-pay | Admitting: Cardiovascular Disease

## 2017-11-05 DIAGNOSIS — I739 Peripheral vascular disease, unspecified: Secondary | ICD-10-CM

## 2017-11-15 ENCOUNTER — Ambulatory Visit (HOSPITAL_BASED_OUTPATIENT_CLINIC_OR_DEPARTMENT_OTHER)
Admission: RE | Admit: 2017-11-15 | Discharge: 2017-11-15 | Disposition: A | Discharge status: Home / Self Care | Payer: Medicare Other | Source: Ambulatory Visit | Attending: Internal Medicine | Admitting: Internal Medicine

## 2017-11-15 ENCOUNTER — Ambulatory Visit (HOSPITAL_COMMUNITY)
Admission: RE | Admit: 2017-11-15 | Discharge: 2017-11-15 | Disposition: A | Discharge status: Home / Self Care | Payer: Medicare Other | Source: Ambulatory Visit | Attending: Internal Medicine | Admitting: Internal Medicine

## 2017-11-15 DIAGNOSIS — I739 Peripheral vascular disease, unspecified: Secondary | ICD-10-CM

## 2017-12-11 DIAGNOSIS — H35372 Puckering of macula, left eye: Secondary | ICD-10-CM | POA: Diagnosis not present

## 2017-12-11 DIAGNOSIS — H353112 Nonexudative age-related macular degeneration, right eye, intermediate dry stage: Secondary | ICD-10-CM | POA: Diagnosis not present

## 2017-12-11 DIAGNOSIS — H353222 Exudative age-related macular degeneration, left eye, with inactive choroidal neovascularization: Secondary | ICD-10-CM | POA: Diagnosis not present

## 2017-12-13 DIAGNOSIS — J069 Acute upper respiratory infection, unspecified: Secondary | ICD-10-CM | POA: Diagnosis not present

## 2017-12-13 DIAGNOSIS — R52 Pain, unspecified: Secondary | ICD-10-CM | POA: Diagnosis not present

## 2017-12-13 DIAGNOSIS — J029 Acute pharyngitis, unspecified: Secondary | ICD-10-CM | POA: Diagnosis not present

## 2017-12-13 DIAGNOSIS — Z23 Encounter for immunization: Secondary | ICD-10-CM | POA: Diagnosis not present

## 2017-12-14 DIAGNOSIS — H6120 Impacted cerumen, unspecified ear: Secondary | ICD-10-CM | POA: Diagnosis not present

## 2017-12-14 DIAGNOSIS — I10 Essential (primary) hypertension: Secondary | ICD-10-CM | POA: Diagnosis not present

## 2017-12-31 DIAGNOSIS — L821 Other seborrheic keratosis: Secondary | ICD-10-CM | POA: Diagnosis not present

## 2017-12-31 DIAGNOSIS — X32XXXA Exposure to sunlight, initial encounter: Secondary | ICD-10-CM | POA: Diagnosis not present

## 2017-12-31 DIAGNOSIS — L570 Actinic keratosis: Secondary | ICD-10-CM | POA: Diagnosis not present

## 2017-12-31 DIAGNOSIS — L820 Inflamed seborrheic keratosis: Secondary | ICD-10-CM | POA: Diagnosis not present

## 2018-01-01 ENCOUNTER — Ambulatory Visit (INDEPENDENT_AMBULATORY_CARE_PROVIDER_SITE_OTHER): Payer: Medicare Other | Admitting: Pulmonary Disease

## 2018-01-01 ENCOUNTER — Encounter: Payer: Self-pay | Admitting: Pulmonary Disease

## 2018-01-01 ENCOUNTER — Other Ambulatory Visit (INDEPENDENT_AMBULATORY_CARE_PROVIDER_SITE_OTHER): Payer: Medicare Other

## 2018-01-01 VITALS — BP 140/64 | HR 77 | Temp 97.7°F | Ht 67.0 in | Wt 150.8 lb

## 2018-01-01 DIAGNOSIS — I10 Essential (primary) hypertension: Secondary | ICD-10-CM | POA: Diagnosis not present

## 2018-01-01 DIAGNOSIS — J8410 Pulmonary fibrosis, unspecified: Secondary | ICD-10-CM

## 2018-01-01 DIAGNOSIS — G319 Degenerative disease of nervous system, unspecified: Secondary | ICD-10-CM

## 2018-01-01 DIAGNOSIS — R42 Dizziness and giddiness: Secondary | ICD-10-CM

## 2018-01-01 DIAGNOSIS — E7800 Pure hypercholesterolemia, unspecified: Secondary | ICD-10-CM

## 2018-01-01 DIAGNOSIS — M159 Polyosteoarthritis, unspecified: Secondary | ICD-10-CM

## 2018-01-01 DIAGNOSIS — I6523 Occlusion and stenosis of bilateral carotid arteries: Secondary | ICD-10-CM | POA: Diagnosis not present

## 2018-01-01 DIAGNOSIS — I350 Nonrheumatic aortic (valve) stenosis: Secondary | ICD-10-CM

## 2018-01-01 DIAGNOSIS — I25810 Atherosclerosis of coronary artery bypass graft(s) without angina pectoris: Secondary | ICD-10-CM | POA: Diagnosis not present

## 2018-01-01 DIAGNOSIS — E119 Type 2 diabetes mellitus without complications: Secondary | ICD-10-CM | POA: Diagnosis not present

## 2018-01-01 DIAGNOSIS — M150 Primary generalized (osteo)arthritis: Secondary | ICD-10-CM

## 2018-01-01 DIAGNOSIS — I739 Peripheral vascular disease, unspecified: Secondary | ICD-10-CM | POA: Diagnosis not present

## 2018-01-01 DIAGNOSIS — E559 Vitamin D deficiency, unspecified: Secondary | ICD-10-CM

## 2018-01-01 DIAGNOSIS — E039 Hypothyroidism, unspecified: Secondary | ICD-10-CM

## 2018-01-01 DIAGNOSIS — N184 Chronic kidney disease, stage 4 (severe): Secondary | ICD-10-CM | POA: Diagnosis not present

## 2018-01-01 LAB — COMPREHENSIVE METABOLIC PANEL
ALBUMIN: 4.4 g/dL (ref 3.5–5.2)
ALT: 12 U/L (ref 0–53)
AST: 20 U/L (ref 0–37)
Alkaline Phosphatase: 68 U/L (ref 39–117)
BUN: 35 mg/dL — AB (ref 6–23)
CHLORIDE: 101 meq/L (ref 96–112)
CO2: 26 mEq/L (ref 19–32)
Calcium: 10 mg/dL (ref 8.4–10.5)
Creatinine, Ser: 2.48 mg/dL — ABNORMAL HIGH (ref 0.40–1.50)
GFR: 26.06 mL/min — ABNORMAL LOW (ref >60.00)
Glucose, Bld: 95 mg/dL (ref 70–99)
POTASSIUM: 5.6 meq/L — AB (ref 3.5–5.1)
SODIUM: 134 meq/L — AB (ref 135–145)
Total Bilirubin: 0.5 mg/dL (ref 0.2–1.2)
Total Protein: 7.7 g/dL (ref 6.0–8.3)

## 2018-01-01 LAB — TSH: TSH: 4.34 u[IU]/mL (ref 0.35–4.50)

## 2018-01-01 LAB — CBC WITH DIFFERENTIAL/PLATELET
Basophils Absolute: 0.1 10*3/uL (ref 0.0–0.1)
Basophils Relative: 0.9 % (ref 0.0–3.0)
EOS PCT: 3.3 % (ref 0.0–5.0)
Eosinophils Absolute: 0.2 10*3/uL (ref 0.0–0.7)
HCT: 30.3 % — ABNORMAL LOW (ref 39.0–52.0)
HEMOGLOBIN: 10.4 g/dL — AB (ref 13.0–17.0)
Lymphocytes Relative: 22.2 % (ref 12.0–46.0)
Lymphs Abs: 1.6 10*3/uL (ref 0.7–4.0)
MCHC: 34.2 g/dL (ref 30.0–36.0)
MCV: 90.5 fl (ref 78.0–100.0)
MONOS PCT: 16.2 % — AB (ref 3.0–12.0)
Monocytes Absolute: 1.2 10*3/uL — ABNORMAL HIGH (ref 0.1–1.0)
Neutro Abs: 4.1 10*3/uL (ref 1.4–7.7)
Neutrophils Relative %: 57.4 % (ref 43.0–77.0)
Platelets: 272 10*3/uL (ref 150.0–400.0)
RBC: 3.34 Mil/uL — AB (ref 4.22–5.81)
RDW: 13.9 % (ref 11.5–15.5)
WBC: 7.1 10*3/uL (ref 4.0–10.5)

## 2018-01-01 LAB — HEMOGLOBIN A1C: Hgb A1c MFr Bld: 6.2 % (ref 4.6–6.5)

## 2018-01-01 NOTE — Patient Instructions (Signed)
Today we updated your med list in our EPIC system...    Continue your current medications the same...  Today we checked a follow up CXR & blood work...    We will contact you w/ the results when available...   We discussed my pending retirement & the need to select a new primary care provider...    We gave you a list of the [place] satellite offices and the providers that are accepting new patients...  Daison,  It has be my great honor to have been one of your doctors over these many years!    Sending you and your family my best wishes for good health & much happiness in the years to come!!

## 2018-01-03 ENCOUNTER — Encounter: Payer: Self-pay | Admitting: Pulmonary Disease

## 2018-01-03 NOTE — Progress Notes (Signed)
Subjective:    Patient ID: Henry [name], male    DOB: 30-Jan-1925, 84 y.o.   MRN: [id number]  HPI  84 y/o WM here for a follow up visit... he has mult med problems as listed and is followed by DrBBrodie=>DrMCoooper, and DrPerry as well... he gets his meds filled at the [facility] which makes for a complicated array of perscribed meds and substituted alternatives... ~  SEE PREV EPIC NOTES FOR OLDER DATA >>     CDoppler 1/13 showed mild heterogeneous plaque bilat w/ 40-59% bilat ICAstenoses...  CT/ MRI Brain 2/14 w/ atrophy & sm vessel dis  CXR 6/14 showed normal heart size, tortuous Ao, mild COPD changes w/ clear lungs/ NAD, DJD in Tspine...   LABS 6/14:  Chems- ok x BS=132 A1c=6.6 Creat=1.7;  CBC- Hg=11.3;  TSH=5.84;  B12=530;  BNP=198...  Lexiscan Myoview 08/2012:  NEG nuclear study- nl BP response, no CP, no signif ST changes, EF=61%, norm wall motion...   LABS 12/14:  FLP- at goals on Lip20;  Chems- ok x BS=131, A1c=6.9, Cr=1.8;  CBC= Hg= 10.0;  TSH=8.74 & rec to start Synthroid50...  LABS 7/15:  Chems- ok x BS=129, A1c=6.5, BUN=25, Cr=1.8;  CBC- ok x Hg=10.4, Fe=84 (27%sat), Ferritin=104;  TSH=3.50...  Art Dopplers 12/15 showed ABIs .79R & .71L, TBIs were abn but w/o risk of tissue loss  CXR 1/16 showed norm heart size, prior CABG, COPD w/ scarring at the bases, NAD...  LABS 1/16:  FLP- ok on Atorva20;  Chems- ok w/ stable Cr=1.5, BS=127;  CBC- mild chr anemia w/ Hg=10.1;  B12=408; TSH=9.10 off meds...   ~  August 31, 2014:  30mo ROV & Henry [name] has had a good interval- his CC's are "tightness in my legs" esp w/ walking & he has known ASPVD w/ f/u [name] soon, and feeling sl better on the Synthroid50 regularly... We reviewed the following medical problems during today's office visit >>     HBP> on Metop25Bid, Amlod5, Losar100, Lasix20 (prn);  BP= 114/56 & he has persistent mild edema; denies CP, palpit, ch in SOB, etc...    ASHD> on ASA81; known CAD & s/p CABG 2000; followed by [name] &  last seen 12/15- norm Myoview 7/14; he denies CP/ angina, palpit, SOB; needs to take Lasix20 daily for the edema.    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    Periph Vasc Dis> on ASA81; he has stable claud w/ ambulation; last Art Dopplers 12/15 showed ABIs .79R & .71L, TBIs were abn but w/o risk of tissue loss; overdue for yearly f/u studies...    CHOL> on Lip20 (off prev Crestor); Labs 1/16 showed TChol 164, TG 151, HDL 36, LDL 98... Needs better diet.    DM> on diet alone (off prev Metform); Labs 1/16 showed BS= 127; Labs 7/16 showed BS=116, A1c=6.3; we reviewed diet, exercise, etc....    Renal Insuffic> he knows to avoid NSAIDs; Labs 12/14 w/ Creat= 1.8 and Labs 1/16 showed Cr= 1.5; Labs 7/16 w/ Cr=2.0 & rec to incr water intake...    DJD, LBP w/ spinal stenosis> on Tramadol50 prn; followed by DrRamos=> DrBrooks at [facility]; they did MRI w/ report of sp stenosis severe at L4-5; surg 7/14 w/ lumbar decompression & fusion L4-5...    Anemia> on FeSO4 325mg /d; Labs 1/16 w/ Hg= 10.1 stable;  Labs 7/16 showed Hg=10, Fe=57 (20%sat), Ferritin=109; continue same... We reviewed prob list, meds, xrays and labs> see below for updates >>   LABS 7/16:  Chems- ok w/ BS=116, A1c=6.3, BUN=28, Cr=2.0;  CBC- anemic w/ Hg=10.0, Fe=57 (20%), Ferritin=109;  TSH=3.62... PLAN>>  Continue same meds, continue f/u [name], ROV w/ me in 18mo...  ~  March 23, 2015:  71mo ROV & Henry [name] returns for routine ROV- he tells me he recently fell on the ice & hurt left leg, ortho eval was neg for any fxs; otherw feeling well & he denies CP, palpit, SOB, edema; he denies cough, sput, hemoptysis, etc;  he had Cards f/u w/ [name] 12/2014> f/u HBP, CAD, remote CABG, HL, ASPVD w/ claudication, CRI w/ Cr~2> BNP reported elev at [facility] & he was given more  Lasix (40mg /d); f/u 2DEcho 02/08/15 showed mild LVH, norm LVF w/ EF=55-60%, no regional wall motion abn, Gr1DD, mod AoV calcif & mild AS/ trivAI, mild MR, mild LAdil, norm RVF & PAsys=29...    EXAM shows Afeb, VSS, O2sat=98% on RA;  HEENT- neg, mallampati2;  Chest- bibasilar crackles ~1/3rd way up w/o w/r;  Heart- RR gr1/6 SEM at apex & base, w/o r/g;  Abd- soft, neg;  Ext- left ankle swelling  EKG 12/2014 showed NSR, rate 63/min, Q in III, NSSTTWA, NAD...  2DEcho 02/08/15 showed mild LVH, norm LVF w/ EF=55-60%, no regional wall motion abn, Gr1DD, mod AoV calcif & mild AS/ trivAI, mild MR, mild LAdil, norm RVF & PAsys=29  CDopplers 03/22/15>  bilat heterogen plaque, 40-59% bilat ICAstenoses- stable; norm subclav & vertebrals...   CXR 03/23/15>  Heart at upper lim of norm & s/p CABG; underlying COPD w/ flatening of diaph & chr incr markings at bases; mild DDD in Tspine; NAD- s/p CABG & chronic changes...  LABS 03/23/15>  FLP- all parameters at goals on Lip10;  Chems- ok x BS=105, A1c=6.6, BUN=29, Cr=1.94, LFTs=wnl;  CBC- stable anemia w/ Hg=10.2, MCV=91; Fe=93 (30%sat), Ferritin=187, SPE/IEP- neg, no monoclonal prot... IMP/PLAN>>  Trigger is stable on his current regimen- continue same; reminded to be careful during the winter months; we plan ROV recheck in 34months...  ~  August 16, 2015:  34mo ROV & Henry [name] is c/o increased fatigue & bilat leg tightness w/ exertion (we discussed checking Art dopplers and ABIs); he also notes that his balance is off but no falls (we discussed a Neuro eval & poss PT rx but he declines and says he'll think about it);  We reviewed the following medical problems during today's office visit >>     HBP> on Metop25Bid, Amlod5, Losar100, Lasix40;  BP= 130/64 & he has persistent mild edema; denies CP, palpit, ch in SOB, etc...    ASHD> on ASA81; known CAD & s/p CABG 2000; followed by [name] & last seen 01/24/15- norm Myoview 7/14; he denies CP/ angina, palpit, SOB; needs to take Lasix20  daily for the edema.    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    Periph Vasc Dis> on ASA81; he has stable claud w/ ambulation; last Art Dopplers 12/15 showed ABIs .79R & .71L, TBIs were abn but w/o risk of tissue loss; now c/o increased leg symptoms- ArtDopplers pending...    CHOL> on Lip20; Labs 1/16 showed TChol 164, TG 151, HDL 36, LDL 98... Needs better diet.  DM> on diet alone (off prev Metform); Labs 1/16 showed BS= 127; Labs 7/16 showed BS=116, A1c=6.3; we reviewed diet, exercise, etc....    Hypothyroid> on Synthroid50    Renal Insuffic> he knows to avoid NSAIDs; Labs 12/14 w/ Creat= 1.8 and Labs 1/16 showed Cr= 1.5; Labs 7/16 w/ Cr=2.0 & rec to incr water intake...    DJD, LBP w/ spinal stenosis> on Tramadol50 prn; followed by DrRamos=> DrBrooks at [facility]; they did MRI w/ report of sp stenosis severe at L4-5; surg 7/14 w/ lumbar decompression & fusion L4-5...    Anemia> on FeSO4 325mg /d; Labs 1/16 w/ Hg= 10.1 stable; Labs 7/16 showed Hg=10, Fe=57 (20%sat), Ferritin=109; continue same... EXAM shows Afeb, VSS, O2sat=100% on RA;  HEENT- neg, mallampati2;  Chest- bibasilar crackles ~1/3rd way up w/o w/r;  Heart- RR gr1/6 SEM at apex & base, w/o r/g;  Abd- soft, neg;  Ext- left ankle swelling  LABS 08/16/15>  Chems- ok w/ K=5.3, BS=107, A1c=6.3, BUN=37, Cr=2.17;  CBC- Hg=10.4, otherw ok...   ArtDopplers LEs 08/24/15>  Known bilat SFA disease- R-ABI=0.83 (mild), L-ABI=0.79 (mod); TBIs are R-0.65 & L-0.52...  IMP/PLAN>>  Henry [name] is 84 y/o w/ multisys dis, known ASHD & PVD, continue same meds and try to incr exercise; we discussed low sodium, elev legs, support hose, continue Lasix40; he will maintain f/u w/ Cards- [name]...  ~  January 30, 2016:  35mo Henry [name] reports doing satis- no new complaints or  concerns;  He is still caring for wife (5) w/ alz dis at home, they have 5 children- 2 in [place], 1 in [place], 1 in [place], and 1 daugh in [place] Henry [name])... We reviewed the following medical problems during today's office visit >> Note: he gets his meds from the [place].    HBP> on Metop25Bid, Amlod10, Losar100, Lasix20;  BP= 110/70 & he has intermittent mild edema; denies CP, palpit, ch in SOB, etc...    ASHD> on ASA81; known CAD & s/p CABG 2000; followed by [name] & last seen 01/24/15- norm Myoview 7/14; he denies CP/ angina, palpit, SOB; needs to take Lasix20 daily for the edema.    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    Periph Vasc Dis> on ASA81; he has stable claud w/ ambulation ("tight in legs"); Art Dopplers 12/15 showed ABIs .79R & .71L, TBIs were abn but w/o risk of tissue loss; f/u ArtDopplers 07/2015 were stable: .83R & .79L w/ similar TBIs    CHOL> on Lip20-1/2 tab/d; Labs 12/17 showed TChol 138, TG 73, HDL 48, LDL 76... Continue same...    DM> on diet alone (off prev Metform); Labs 12/17 showed BS= 120; Labs 7/16 showed BS=116, A1c=6.3; we reviewed diet, exercise, etc....    Hypothyroid> on Synthroid47mcg/d w/ Labs 12/17 showing TSH=2.78    Renal Insuffic> he knows to avoid NSAIDs; Labs 12/14 w/ Creat= 1.8 and Labs 1/16 showed Cr= 1.5; Labs 7/16 w/ Cr=2.0 & rec to incr water intake & most recent lab 12/17 showed Cr=2.36...    DJD, LBP w/ spinal stenosis> on Tramadol50 prn; followed by DrRamos=> DrBrooks at [facility]; they did MRI w/ report of sp stenosis severe at L4-5; surg 7/14 w/ lumbar decompression & fusion L4-5...    Anemia> on FeSO4 325mg /d, MVI & AREDS2; Labs 1/16 w/ Hg= 10.1 stable; Labs  7/16 showed Hg=10, Fe=57 (20%sat), Ferritin=109; most recent lab 12/17 showed Hg=  9.7.Marland KitchenMarland Kitchen EXAM shows Afeb, VSS, O2sat=100% on RA;  HEENT- neg, mallampati2;  Chest- bibasilar crackles ~1/3rd way up w/o w/r;  Heart- RR gr1/6 SEM at apex & base, w/o r/g;  Abd- soft, neg;  Ext- left ankle swelling  LABS 01/30/16>  FLP- all parameters at goals on Lip20-1/2 tab Qd;  Chems- ok x BS=120, BUN=42, Cr=2.36;  CBC- anemic w/ Hg=9.7;  TSH=2.78;  BNP=355  IMP/PLAN>>  Dmoni is stable w/ multisystem dis, continue same meds and regular clinical follow up  ~  July 30, 2016:  65mo ROV & Kamarian relates incr stress at home caring for wife w/ alz; his legs remain stiff & get "tight" w/ exercise; he is managing remarkably well at 89 w/ multisystem disease... 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    HBP> on Metop25Bid, Amlod10, Losar100, Lasix20;  BP= 110/70 & he has intermittent mild edema; denies CP, palpit, ch in SOB, etc...    ASHD> on ASA81; known CAD & s/p CABG 2000; followed by [name] & last seen 01/24/15- norm Myoview 7/14; he denies CP/ angina, palpit, SOB; on Lasix20 daily for the edema => we will arrange for CARDS f/u appt.    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    Periph Vasc Dis> on ASA81; he has stable claud w/ ambulation ("tight in legs"); Art Dopplers 12/15 showed ABIs .79R & .71L, TBIs were abn but w/o risk of tissue loss; f/u ArtDopplers 07/2015 were stable: .83R & .79L w/ similar TBIs    CHOL> on Lip20-1/2 tab/d; Labs 12/17 showed TChol 138, TG 73, HDL 48, LDL 76... Continue same...    DM> on diet alone (off prev Metform); Labs 12/17 showed BS= 120; Labs 7/16 showed BS=116, A1c=6.3; we reviewed diet,  exercise, etc....    Hypothyroid> on Synthroid79mcg/d w/ Labs 12/17 showing TSH=2.78    Renal Insuffic> he knows to avoid NSAIDs; Labs 12/14 w/ Creat= 1.8 and Labs 7/16 w/ Cr=2.0 & rec to incr water intake => Labs 12/17 w/ Cr=2.36 & today Labs showed Cr=2.54 => we will refer to NEPHROLOGY for input.    DJD, LBP w/ spinal stenosis> prev on Tramadol50 prn; followed by DrRamos=> DrBrooks at [facility]; they did MRI w/ report of sp stenosis severe at L4-5; surg 7/14 w/ lumbar decompression & fusion L4-5...    Anemia> on FeSO4 325mg /d, MVI & AREDS2; Labs 1/16 w/ Hg= 10.1 stable; Labs 7/16 showed Hg=10, Fe=57 (20%sat), Ferritin=109; Lab 12/17 w/ Hg= 9.7, and Labs today showed Hg=10.7 c/w ACD-renal...    Anxiety> under stress caring for wife w/ Alz, fatigue, c/o legs feel tight... EXAM shows Afeb, VSS, O2sat=100% on RA;  HEENT- neg, mallampati2;  Chest- bibasilar crackles ~1/3rd way up w/o w/r;  Heart- RR gr1/6 SEM at apex & base, w/o r/g;  Abd- soft, neg;  Ext- left ankle tr swelling  CXR 03/07/16>  Coarse interstitial markings at bases (no change), no airsp dis, norm heart size, atherosclerotic Ao, prev CABG, DJD Tspine.  CDopplers 03/21/16>  bilat heterogen plaque, 1-39% bilat ICAstenoses (improved  from 2017)... Continue same meds.  Art Dopplers of LEs w/ ABIs> sched & pending 07/2016  LABS 07/30/16>  Chems- BS=114, BUN=42, Cr=2.54, K=4.6;  CBC- Hg=10.7, otherw ok (eos=5.5%)... He needs Nephrology referral w/ slow steady rise in Cr... IMP/PLAN>>  Kristeen Mans is stable w/ his severe dis at age 54, still caring for his wife w/ AlzDis;  His Creat continues to slowly rise despite BP control/ ARB rx/ no NSAIDs/ etc=> we will refer to Nephrology for their suggestions;  He has anemia of chr renal dis as well but appears stable at 10.7 today;  He is overdue for CARDS f/u w/ [name] but infact appears stable- recall that he gets meds from the [place] & is seen there Q11mo as well...  ~  January 22, 2017:  81mo ROV & Henry [name]  reports a good 9mo interval- feeling well & he denies CP, palpit, dizziness, edema; his SOB/ DOE is the same (eg- w/ yard work or rushing) & he handles stairs at home OK;  He even notes more energy w/ his med adjustments; also denies cough, sput, hemoptysis;  His biggest concern is for his renal function>>     He saw Nephrology- DrColadonato 11/08/16>  Note reviewed-- CRD- stage4, secondary to DM & HBP, w/ gradual worsening over 40yrs;  Hx CAD-s/pCABG & PVD;  He gets monthly EPO injections at the [facility];  He knows not to use NSAIDs or Cox-2s;  On Lasix for edema in LEs;  On Losartan100 for many yrs;  Sonar w/ echogenic kidneys c/w medical renal dis;  Neg SPEP/UPEP & subnephrotic proteinuria...  LABS 9/13> Cr=2.57, chems- wnl, UA- neg x 2+prot, Hg=10.6... They rec BP control <130/80, A1c goal <7, continue ARB except in presence of vol depletion (n/v/d), avoid nephrotoxic meds    He saw CARDS- [name] 10/25/16>  Hx HBP, CAD- s/p CABG, PAD- w/ intermittent claud, DM, HL, CKD... No CP or pressure, denied SOB, notes min edema & leg discomfort w/ ambulation; they performed f/u ABIs and 2DEcho.  EKG 10/25/16>  NSR, rate61, 1st degree AVB, infer Qs, NAD...  LE Art Dopplers 11/01/16>  Right ABI=0.82 (mild & stable) and Left ABI=0.59 (sl decr & in the mod range); bilat abnormal TBIs;  They rec referral for PV consult...  2DEcho 10/31/16>  Norm LV wall thickness & norm LVF w/ EF=55-60%, some basal infer HK, otherw no regional wall motion abn, Gr1DD, mod calcif AoV leaflets w/ mild to mod AS (peak grad=28, mean grad=17), mildly enlarged Ao root & asc ao37-9mm, mild MR, norm RV w/ PAsys=27    He had PV consult- DrArida 12/25/16>  Pt c/o claud after walking ~1/2 block, has to stop & rest; ABIs notes- distal pulses not palpable, pt did not want contrast for arteriogram, DrArida started Pletal trial (50mg  Bid) & is checking Ao-iliac Duplex...    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    HBP> on Metop25Bid, Amlod10, Losar100, Lasix40;  BP= 130/58 & he has intermittent mild edema; denies CP, palpit, ch in SOB, etc...    ASHD> on ASA81; known CAD & s/p CABG 2000; followed by [name] & last seen 8/18- norm Myoview 7/14; he denies CP/ angina, palpit, SOB; on Lasix40 daily now for the edema, continue same meds...    Aortic Stenosis>  He has  2DEcho 04/30/16 by [name] w/ norm LV size/ wall thickness/ function w/ EF=55-60%, some basilar inferior HK, Gr1DD, mod calcif AoV leaflets w/ mild-mod AS/ triv regurg (peak grad 1mmHg), mod enlarged Ao root, mild MR, norm RV, PAsys=41mmHg.    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    Periph Vasc Dis> on ASA81; he has stable claud w/ ambulation ("tight in legs"); Art Dopplers 9/18 showed ABIs .82R & .59L, TBIs were abn but w/o risk of tissue loss;  He was referred to DrArida for PV eval & duplex pending...    CHOL> on Lip20-1/2 tab/d; Labs 11/18 showed TChol 164, TG 110, HDL 45, LDL 97... Continue same med + better diet...    DM> on diet alone (off prev Metform); Labs 11/18 showed BS= 113 & A1c=6.6; we reviewed diet, exercise, etc....    Hypothyroid> on Synthroid62mcg/d w/ Labs 11/18 showing TSH=5.16 & reminded to take it every day, 1st thing in the AM, on empty stomach & don't eat for 45 min...    Renal Insuffic> he knows to avoid NSAIDs; Labs 12/14 w/ Creat= 1.8 and Labs 7/16 w/ Cr=2.0 & rec to incr water intake => Labs 11/18 w/ Cr= 2.68 & he is followed by [facility] now...    DJD, LBP w/ spinal stenosis> prev on Tramadol50- off now, just Tylenol; followed by DrRamos=> DrBrooks at [facility]; they did MRI w/  report of sp stenosis severe at L4-5; surg 7/14 w/ lumbar decompression & fusion L4-5...    Anemia> on FeSO4 325mg /d, MVI & AREDS2; Labs 1/16 w/ Hg= 10.1 stable; Labs 7/16 showed Hg=10, Fe=57 (20%sat), Ferritin=109; Lab 11/18 w/ Hg= 11.1 on EPO monthly at the [place]...    Anxiety> under stress caring for wife w/ Alz, fatigue, c/o legs feel tight... EXAM shows Afeb, VSS, O2sat=98% on RA;  HEENT- neg, mallampati2;  Chest- bibasilar crackles ~1/3rd way up w/o w/r;  Heart- RR gr1/6 SEM at apex & base, w/o r/g;  Abd- soft, neg;  Ext- left ankle tr swelling  CXR 01/22/17 (independently reviewed by me in the PACS system) showed borderline heart size, s/p CABG, lungs show some chr changes w/ basilar scarring/ fibrosis & we will continue to follow...  LABS 01/22/17>  FLP ok on Lip10;  Chems- BS=113, A1c=6.6, Cr=2.68;  CBC- Hg=11.1;  TSH=5.16 on 66mcg/d;  VitD=35... IMP/PLAN>>  As above- we continue to follow Pulm/ general medical; he sees [name] for Cards, DrArida for PV, DrColadonato for Renal, & [facility] [place];  Reminded to take meds regularly & the Synthroid by itself on empty stomach etc;  Asked to remain as active as poss & call for any interval problems...  ~  [name] 29, 2019:  41mo ROV & Henry [name] continues to do amazingly well for 84 y/o w/ his multisystem disease... He had a fall in Mar2019 (fell down stairs backwards) w/ signif injury including concussion, several broken ribs- eval by Ortho, DrGioffre (see below);  He reports that he has made a full recovery & doing well now;  Says his breathing is good- denies cough/ sputum/ SOB/ CP/ palpit/ edema... We reviewed the following interval medical visits in Epic-EMR>      He continues to follow regularly at the [place] in [place]>  We do not have records...    He saw CARDS/PV- DrArida on 03/26/17>  PAD w/ chr  claudication, cardiac problems as noted, CKD4; ABIs w/ worsening dis but given his mult other issues they decided to treat w/ Pletal 100mg Bid=> he reports sl  improvement in symptoms...    He saw Manson Passey- DrGioffre 04/2017>    Bilat Rib detail films 05/07/17 in Ortho office>  C/o right rib pain after fall at home, hematoma noted; right rib fx reported by DrGioffre (XRay report not included w/ fax records), given Oxy5 for pain...  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    He saw PODIATRY- DrMayer on 07/10/17>  Onychomycosis and DM w/ neuropathy; pulses ok, nails trimmed & he is followed Q38mo... We reviewed the following medical problems during today's office visit>      Bibasilar pulm fibrosis & hx AB> Jameon has underlying post-inflamm pulm fibrosis & had a bout of AB 02/2016- treated w/ Augmentin & Pred=> resolved back to baseline...    HBP> on Metop25Bid, Amlod10, , Lasix40;  BP= 138/66 & he has intermittent mild edema; denies CP, palpit, ch in SOB, etc...    ASHD> on ASA81; known CAD & s/p CABG 2000; followed by [name] & last seen 8/18- norm Myoview 7/14; he denies CP/ angina, palpit, SOB; on Lasix40 daily now for the edema, continue same meds...    Aortic Stenosis>  He has 2DEcho 04/30/16 by [name] w/ norm LV size/ wall thickness/ function w/ EF=55-60%, some basilar inferior HK, Gr1DD, mod calcif AoV leaflets w/ mild-mod AS/ triv regurg (peak grad 28mmHg), mod enlarged Ao root, mild MR, norm RV, PAsys=75mmHg.    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    Periph Vasc Dis> on ASA81 & Pletal100Bid; he has stable claud w/ ambulation ("tight in legs"); Art Dopplers 9/18 showed ABIs .82R & .59L, TBIs were abn but w/o risk of tissue loss;  He was referred to DrArida for PV eval, conservative management on Pletal.    CHOL> on Lip20-1/2 tab/d; Labs 11/18 showed TChol 164, TG 110, HDL 45, LDL 97... Continue same med + better diet...    DM> on diet alone (off prev Metform); Labs 11/18 showed BS= 113 & A1c=6.6; we reviewed diet, exercise, etc....    Hypothyroid> on Synthroid108mcg/d w/ Labs 11/18 showing TSH=5.16 & reminded to take it every day, 1st thing in the AM, on empty stomach & don't eat for 45 min...    Renal Insuffic> he knows to avoid NSAIDs; Labs 12/14 w/ Creat= 1.8 and Labs 7/16 w/ Cr=2.0 & rec to incr water intake => Labs 11/18 w/ Cr= 2.68 & he is followed by DrColadonato now => Cr up to 3.84 & preparing for dialysis management.    DJD, LBP w/ spinal stenosis> prev on Tramadol50- off now, just Tylenol; followed by DrRamos=> DrBrooks at [facility]; they  did MRI w/ report of sp stenosis severe at L4-5; surg 7/14 w/ lumbar decompression & fusion L4-5...    Anemia> on FeSO4 325mg /d, MVI & AREDS2; Labs 1/16 w/ Hg= 10.1 stable; Labs 7/16 showed Hg=10, Fe=57 (20%sat), Ferritin=109; Lab 11/18 w/ Hg= 11.1 on EPO monthly at the [place]...    Anxiety> under stress caring for wife w/ Alz, fatigue, c/o legs feel tight... EXAM shows Afeb, VSS, O2sat=97% on RA;  HEENT- neg, mallampati2;  Chest- bibasilar crackles ~1/3rd way up w/o w/r;  Heart- RR gr1/6 SEM at apex & base, w/o r/g;  Abd- soft, neg;  Ext- left ankle tr swelling IMP/PLAN>>  D[place]ale has advancing renal failure, GFR down to 13 & Nephrology is preparing for dialysis;  CV & PV management is conservative given his severe multisystem dis at age 71;  He still goes to the [place] periodically but we  don't have records;  We broached future management w/ him today, no changes in meds at this time...    ~  January 01, 2018:  82mo ROV              Problem List:  HYPERTENSION (ICD-401.9) >>  ~  controlled on 4 meds:  METOPROLOL 25mg Bid, AMLODIPINE 10mg /d, LOSARTAN 25mg /d, and HCT 25mg - 1/2 qd...  ~  Henry [name] showed mild LVH, AoV sclerosis & mild AI, EF= 60%... ~  labs 11/11 showed BUN=36, Creat=2.1, K=5.1.Marland KitchenMarland Kitchen rec to stop HCTZ... ~  The [facility] subseq re-started 12.5mg  HCTZ in the interim due to edema... ~  6/14:  on Metop25/d, Amlod10, Losar25, Lasix20;  BP= 160/64 but he is in pain & ?took his meds today; he denies CP, palpit, ch in SOB, edema, etc; asked to incr Metop25Bid.  ~  CXR 6/14 showed norm heart size, atherosclerotic & tort Ao, emphysematous changes in upper lobes & scarring in lower lobes, DJD in spine, NAD... ~  12/14: on Metop25Bid, Amlod10, Losar25-3/d, Lasix20;  BP= 144/60 & he denies CP, palpit, ch in SOB, edema, etc. ~  7/15: BP controlled on Metop25Bid, Amlod10, Losar25-3/d, Lasix20;  BP= 120/74 7 he denies CP, palpit, dyspnea, etc; Labs showed K=4.7, BUN=25, Cr=1.8 stable. ~  1/16: on Metop25Bid, Amlod5, Losar100, Lasix20 (prn);  BP= 124/68 & he has persistent edema; denies CP, palpit, ch in SOB, etc. ~  7/16: on Metop25Bid, Amlod5, Losar100, Lasix20 (prn);  BP= 114/56 & he remains stable...  ARTERIOSCLEROTIC HEART DISEASE (ICD-414.00) - on ASA 81mg /d... long time pt of DrJoeLeBauer & followed by DrBrodie now... s/p CABG in 2000 by DrBartle (for L main dis)... he is active & w/o angina, palpit, change in SOB/ DOE, etc... ~  NuclearStressTest 9/08 was normal without ischemia and EF=68%... ~  12/13: he saw [name] for f/u CAD, s/p CABG, PAD, Carotid Art dis> no angina, bilat calf pain w/ ambulation but felt to be stable; he [facility], but [facility]... ~  EKG 6/14 showed SBrady, rate56, 1st degree AVB, otherw wnl... ~  7/14:  Myoview showed normal wall  motion & EF=61%, no CP or EKG changes... 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 ~  1/16: on ASA81; known CAD & s/p CABG 2000; followed by [name] & last seen 12/15- norm Myoview 7/14; he denies CP/ angina, palpit, SOB; needs to take Lasix20 daily for the edema.  CAROTID ARTERY DISEASE>  PERIPHERAL VASCULAR DISEASE (ICD-443.9) -  on ASA daily and he has bilat femoral bruits... he has seen DrDowney in the past (now [name]) w/ exerc program recommended... he walks regularly w/ stable claud symptoms, no sores or lesions on LE's... ~  ABI's 10/08 were normal bilat = 1.0.Marland KitchenMarland Kitchen waveforms were biphasic however (no change from 2005). ~  CDoppler's 10/08 showed mild bilat carotid dis w/ 40-59% RICA stenosis, and 6-76% LICA stenosis. ~  CDoppler's 10/09 showed the same= stable. ~  CDoppler's 11/10 showed stable mild carotid dis bilat> 72-09% RICA & 4-70% LICA stenoses. ~  ABI's 11/10 showed prob mod severe >50% distal right SFA stenosis, & mod dis in left SFA, ABI's=0.9 ~  Podiarty sent him to [facility] for ArtDopplers 9/11> ABIs 0.82 R & 0.74L w/ bilat SFA dis L>R, toe pressures adeq for healing... ~  6/14: on ASA81; he denies cerebral ischemic symptoms; CT/ MRI Brain 2/14 showed sm vessel dis, vasc calcif, global atrophy (no hemorrhage or large area infarct); last CDoppler 1/13 showed mild heterogeneous plaque bilat w/ 40-59% bilat ICAstenoses... ~  6/14: on ASA81; he has stable claud w/ ambulation but lim by back pain now; last Art Dopplers 1/13 showed ABIs .92R & .80L, TBIs were abn but w/o risk of tissue loss; overdue for yearly f/u studies of Carotids & ABIs... ~  2022-10-10: He has ASHD & ASPVD on ASA81; as noted he is active & denies angina etc; he saw [name] 12/14 & stable, continue medical therapy & exercise. ~  CDoppler 1/16 showed mild heterogeneous plaque bilat w/ 40-59% bilat ICAstenoses & norm  subclav/ antegrade verts.  HYPERCHOLESTEROLEMIA (ICD-272.0) - prev controlled on Simva40 + diet, [facility] CRESTOR 10mg /d. ~  [place] 2/09 on Simva40 showed Tchol 169, TG 206, HDL 37, LDL 96 ~  FLP 4/10 on Simva40 showed TChol 146, TG 145, HDL 36, LDL 82 ~  FLP 10/10 on Simva40 showed TChol 166, TG 130, HDL 38, LDL 102 ~  FLP 4/11 on Simva40 showed TChol 138, TG 87, HDL 40, LDL 80... [facility] subseq ch to Cres10 ~  FLP 11/11 on Cres10 showed TChol 147, TG 161, HDL 34, LDL 81 ~  6/12:  He indicates interval labs done by the [facility] but he didn't bring copies for [place] to review... ~  6/14: on Lip20 (off prev Crestor); Labs done at [facility] but he didn't bring reports; he is not fasting today for recheck.  ~  [place] 12/14 on Lip20 showed TChol 127, TG 56, HDL 41, LDL 75  ~  FLP 1/16 on Lip20 showed TChol 164, TG 151, HDL 36, LDL 98  DIABETES MELLITUS (ICD-250.00) - he's struggled w/ diet Rx... wt stable ~175 range... prev on Metformin 500Bid but [facility] changed to GLYBURIDE 5mg /d, then decr to 1/2 tab daily due to side effects & he stopped due to hypogly. ~  labs 10/08 showed FBS= 134, and HgA1c= 5.4... ~  labs 6/09 showed BS= 101, HgA1c= 6.4.Marland KitchenMarland Kitchen monofilament test is normal. ~  Eye eval 1/10 at [facility] showed no DM retinopathy... ~  labs 4/10 on MetformBid showed BS= 106, A1c= 6.8, Urine Microalb= sl incr @ 7.8mg % ~  labs 10/10 showed BS= 119, A1c= 6.5...  NOTE: 2/11 [facility] ch to Glybur5mg - 1/2 daily. ~  labs 4/11 on Glyb2.5 showed BS= 97, A1c= 6.6.Marland KitchenMarland Kitchen pt subseq stopped Glybur due to low sugars. ~  labs 11/11 off meds showed BS= 149, A1c= 6.7.Marland KitchenMarland Kitchen coninue diet Rx. ~  6/12:  He indicates interim labs done by the [facility] but he didn't bring copies for [place] to review... ~  6/14: on diet alone (off prev Metform); Labs 2/14 Hosp w/ BS= 150-200+ range; now BS=132, A1c=6.6; he follows low carb diet ~  Labs 12/14 on diet alone (weight up 13# to 173#) showed BS= 131, A1c= 6.9 ~  7/15: He has DM controlled on diet  alone;weight stable at 171#, BMI=25-6; Labs today showed BS=129, A1c=6.5; continue diet management. ~  1/16: on diet alone (off prev Metform); Labs 12/14 showed BS= 131, A1c=6.9; Labs 1/16 showed BS= 127; we reviewed diet, exercise, etc ~  7/16:  On diet alone> Labs 7/16 showed BS=116, A1c=6.3; we reviewed diet, exercise, etc...  BORDERLINE TSH >> ~  Serial labs showed rising TSH> 3.57 => 5.84 => 8.74 (12/14) and we decided to start Synthroid77mcg/d;  [facility]how the [facility] told [facility] to [facility] this med & he did not call to check w/ [place]... ~  7/15  Follow up TSH= 3.50 not on meds, clinically euthyroid, good energy, etc;  we will follow closely...  ~  1/16:  Labs here 1/16 showed TSH= 9.10 & he is requested to restart the Levothy67mcg/d... ~  7/16: now on Levothy50 every day> Labs 7/16 showed TSH= 3.62, continue same.  RENAL INSUFFICIENCY (ICD-588.9) - Creat 1.8-2.0 in the past... ~  labs 6/09 showed BUN= 31, Creat= 1.6... ~  labs 9/09 showed BUN= 25, Creat= 1.6, K= 4.5 ~  labs 4/10 showed BUN= 26, Creat= 1.6, K= 5.0 ~  labs 10/10 showed BUN= 21, Creat= 1.6 ~  labs 4/11 showed BUN= 30, Creat= 1.9..Marland Kitchen advised to stay well hydrated. ~  labs 11/11 showed BUN= 36, Creat= 2.1.Marland KitchenMarland Kitchen rec to stop the HCTZ ~  6/12:  He indicates that the [facility] restarted HCTZ 12.5mg /d & they are following labs... ~  6/14: he knows to avoid NSAIDs; Labs 2/14 w/ Creat= 1.4-1.6 range; Labs today showed BUN= 21, Creat= 1.7... ~  Labs 12/14 showed BUN= 23, Creat= 1.8 ~  Labs 7/15 showed BUN= 25, Cr= 1.8 ~  Labs 1/16 showed BUN= 25, Cr= 1.5 ~  Labs 7/16 showed BUN= 28, Cr= 2.0  DEGENERATIVE JOINT DISEASE (ICD-715.90) LOW BACK PAIN >> he has severe sp stenosis L4-5 per MRI at [facility] 6/14... ~  Notes and Records fro [facility]- DrRamos, DrBrooks> reviewed, they plan surgery if cleared fro CV standpoint...  Hx of ANEMIA OF CHRONIC DISEASE (ICD-285.29) - eval by DrGranfortuna w/ ACD secondary to his renal insuffic and Rx w/ aranesp  periodically... nothing else found on his extensive eval... he was taking Ferrous Gluconate daily (it no longer makes him tired)... ~  labs 2/09 w/ Hg= 11.2.Marland Kitchen. ~  labs 6/09 w/ Hg= 11.0.Marland Kitchen. rec> OTC Fe prep but he never got it! ~  labs 9/09 showed Hg= 10.5, Fe= 81 ~  labs 4/10 showed Hg= 10.5, MCV= 92, Fe= 77 (22%sat), B12= 590, Folate>20... rec> OTC Fe daily. ~  labs 10/10 showed Hg= 10.3, Fe= 88 ~  labs 4/11 showed Hg= 10.9,  Fe= 58... continue Ferrous Gluc daily w/ VitC ~  labs 11/11 showed Hg= 11.6 ~  6/12:  Again he indicated that the [facility] is following his labs, aske to bring copies for [place] to review. ~  6/14: on FeSO4 325mg /d; Labs 2/14 w/ Hg= 10-11 range; Labs now show Hg= 11.3 ~  Labs 12/14 showed Hg= 10.0 ~  7/15: He has a chronic anemia & f/u labs today showed Hg=10.4 (stable) w/ MCV=91, Fe=84 (27%), Ferritin  104; he continues on MVI, Fe, etc. ~  1/16: on FeSO4 325mg /d; Labs 1/16 w/ Hg= 10.1 stable. ~  7/16: on Fe daily> Labs showed Hg=10, Fe=57 (20%sat), Ferritin=109, continue same...   Past Surgical History:  Procedure Laterality Date  . cataract surgery    . COLONOSCOPY     Hx: of  . CORONARY ARTERY BYPASS GRAFT  2000   by Dr. Cyndia Bent  . LUMBAR LAMINECTOMY/DECOMPRESSION MICRODISCECTOMY N/A 09/10/2012   Procedure: LUMBAR DECOMPRESSION,  IN SITU FUSION LUMBAR 4-5;  Surgeon: Melina Schools, MD;  Location: [facility];  Service: Orthopedics;  Laterality: N/A;  . TONSILLECTOMY      Outpatient Encounter Medications as of 01/01/2018  Medication Sig  . amLODipine (NORVASC) 10 MG tablet Take 10 mg by mouth daily.   Marland Kitchen aspirin EC 81 MG tablet Take 81 mg by mouth every morning.   Marland Kitchen atorvastatin (LIPITOR) 20 MG tablet Take 10 mg by mouth every evening.   . Calcium Carbonate-Vitamin D (CALCIUM 600/VITAMIN D PO) Take 1 tablet by mouth 2 (two) times daily.   . Carboxymethylcellulose Sodium (EYE DROPS OP) Apply to eye. OTC Ared 2 Presser Vision- takes each morning  . cilostazol (PLETAL) 100  MG tablet Take 1 tablet (100 mg total) by mouth 2 (two) times daily.  . ferrous gluconate (FERGON) 325 MG tablet Take 325 mg by mouth 2 (two) times daily.   . furosemide (LASIX) 40 MG tablet Take 40 mg by mouth daily.  Marland Kitchen glucose blood (ACCU-CHEK AVIVA PLUS) test strip CHECK BLOOD SUGAR TWICE A DAY.  Marland Kitchen Lancets (ACCU-CHEK MULTICLIX) lancets USE TWICE DAILY.  Marland Kitchen levothyroxine (SYNTHROID, LEVOTHROID) 50 MCG tablet TAKE ONE TABLET BY MOUTH ONCE DAILY BEFORE BREAKFAST  . metoprolol tartrate (LOPRESSOR) 25 MG tablet Take 1 tablet (25 mg total) by mouth 2 (two) times daily.  . Multiple Vitamins-Minerals (CENTRUM SILVER PO) Take 1 tablet by mouth every morning.   . [DISCONTINUED] Multiple Vitamins-Minerals (PRESERVISION AREDS 2 PO) Take 1 tablet by mouth every evening.   No facility-administered encounter medications on file as of 01/01/2018.     No Known Allergies   Immunization History  Administered Date(s) Administered  . Influenza Split 04/07/2012, 12/25/2012  . Influenza Whole 12/09/2007, 12/07/2008, 12/05/2009  . Influenza, High Dose Seasonal PF 10/31/2015, 11/22/2016, 12/13/2017  . Influenza, Seasonal, Injecte, Preservative Fre 03/15/2014  . Influenza,inj,Quad PF,6+ Mos 12/28/2014  . Tdap 01/29/2011    Current Medications, Allergies, Past Medical History, Past Surgical History, Family History, and Social History were reviewed in [employer] record.   Review of Systems       See HPI - all other systems neg except as noted... The patient complains of decreased hearing, dyspnea on exertion, and difficulty walking.  The patient denies anorexia, fever, weight loss, weight gain, vision loss, hoarseness, chest pain, syncope, peripheral edema, prolonged cough, headaches, hemoptysis, abdominal pain, melena, hematochezia, severe indigestion/heartburn, hematuria, incontinence, muscle weakness, suspicious skin lesions, transient blindness, depression, unusual weight change,  abnormal bleeding, enlarged lymph nodes, and angioedema.     Objective:   Physical Exam    WD, WN, 84 y/o WM in NAD... GENERAL:  Alert & oriented; pleasant & cooperative... HEENT:  Hemet/AT, EOM-wnl, PERRLA, EACs-clear, TMs-wnl, NOSE-clear, THROAT-clear & wnl. NECK:  Supple w/ fairROM; no JVD; normal carotid impulses w/ faint right bruit; no thyromegaly or nodules palpated; no lymphadenopathy. CHEST:  Clear to P & A; without wheezes/ rales/ or rhonchi heard... HEART:  Regular, gr 1/6 SEM without rubs or gallops  heard... ABDOMEN:  Soft & nontender; normal bowel sounds; no organomegaly or masses detected. EXT: without deformities, mod arthritic changes; no varicose veins/ venous insuffic/ or edema. he has bilat fem art bruits & minor pressure area betw right 4th & 5th toes... NEURO:  CN's intact;  no focal neuro deficits... DERM:  Lesion on medial side of right small toe, sl tender...  RADIOLOGY DATA:  Reviewed in the EPIC EMR & discussed w/ the patient...  LABORATORY DATA:  Reviewed in the EPIC EMR & discussed w/ the patient...   Assessment & Plan:    07/24/17>   Jonhatan has advancing renal failure, GFR down to 13 & Nephrology is preparing for dialysis;  CV & PV management is conservative given his severe multisystem dis at age 4;  He still goes to the [place] periodically but we don't have records;  We broached future management w/ him today, no changes in meds at this time.   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  HBP>  BP is OK on the current regimen; continue same; he is also followed by the [place]...  ASHD/ CAD>  Followed by [name] for [facility] & the [facility]; continue current meds...  ASPVD- Carotid art dis & PAD>  Decr ABIs & Carotid dis> on ASA 81mg /d; followed by [name] et al...  CHOL>  On Lipitor20-taking 1/2 tab and FLP is at goals...  DM>  On diet alone & labs also followed by there [facility]; labs today showed BS~120 & prev A1c=6.3.Marland KitchenMarland Kitchen  Renal Insuffic>  Creat stable in the  2.3 range on current meds & he knows to avoid NSAIDs.Marland KitchenMarland Kitchen  Anemia>  Hg seems stable in the 10 range w/ ACD...   Patient's Medications  New Prescriptions   No medications on file  Previous Medications   AMLODIPINE (NORVASC) 10 MG TABLET    Take 10 mg by mouth daily.    ASPIRIN EC 81 MG TABLET    Take 81 mg by mouth every morning.    ATORVASTATIN (LIPITOR) 20 MG TABLET    Take 10 mg by mouth every evening.    CALCIUM CARBONATE-VITAMIN D (CALCIUM 600/VITAMIN D PO)    Take 1 tablet by mouth 2 (two) times daily.    CARBOXYMETHYLCELLULOSE SODIUM (EYE DROPS OP)    Apply to eye. OTC Ared 2 Presser Vision- takes each morning   CILOSTAZOL (PLETAL) 100 MG TABLET    Take 1 tablet (100 mg total) by mouth 2 (two) times daily.   FERROUS GLUCONATE (FERGON) 325 MG TABLET    Take 325 mg by mouth 2 (two) times daily.    FUROSEMIDE (LASIX) 40 MG TABLET    Take 40 mg by mouth daily.   GLUCOSE BLOOD (ACCU-CHEK AVIVA PLUS) TEST STRIP    CHECK BLOOD SUGAR TWICE A DAY.   LANCETS (ACCU-CHEK MULTICLIX) LANCETS    USE TWICE DAILY.   LEVOTHYROXINE (SYNTHROID, LEVOTHROID) 50 MCG TABLET    TAKE ONE TABLET BY MOUTH ONCE DAILY BEFORE BREAKFAST   METOPROLOL TARTRATE (LOPRESSOR) 25 MG TABLET    Take 1 tablet (25 mg total) by mouth 2 (two) times daily.   MULTIPLE VITAMINS-MINERALS (CENTRUM SILVER PO)    Take 1 tablet by mouth every morning.   Modified Medications   No medications on file  Discontinued Medications   MULTIPLE VITAMINS-MINERALS (PRESERVISION AREDS 2 PO)    Take 1 tablet by mouth every evening.

## 2018-01-04 LAB — VITAMIN D 1,25 DIHYDROXY
VITAMIN D 1, 25 (OH) TOTAL: 26 pg/mL (ref 18–72)
VITAMIN D3 1, 25 (OH): 26 pg/mL
Vitamin D2 1, 25 (OH)2: <8 pg/mL

## 2018-01-06 ENCOUNTER — Ambulatory Visit (INDEPENDENT_AMBULATORY_CARE_PROVIDER_SITE_OTHER)
Admission: RE | Admit: 2018-01-06 | Discharge: 2018-01-06 | Disposition: A | Discharge status: Home / Self Care | Payer: Medicare Other | Source: Ambulatory Visit | Attending: Pulmonary Disease | Admitting: Pulmonary Disease

## 2018-01-06 ENCOUNTER — Telehealth: Payer: Self-pay | Admitting: Internal Medicine

## 2018-01-06 DIAGNOSIS — J8410 Pulmonary fibrosis, unspecified: Secondary | ICD-10-CM

## 2018-01-06 NOTE — Telephone Encounter (Signed)
Ok with me 

## 2018-01-06 NOTE — Telephone Encounter (Signed)
Pt would like to become a patient on Dr. Jenny Reichmann. He wanted me to make sure you knew he had ties to [employer], did not tell me the significance of this. Please advise on seeing as a transfer from [place]

## 2018-01-08 ENCOUNTER — Ambulatory Visit: Payer: Medicare Other | Admitting: Pulmonary Disease

## 2018-01-10 NOTE — Telephone Encounter (Signed)
appt scheduled

## 2018-01-15 ENCOUNTER — Ambulatory Visit (INDEPENDENT_AMBULATORY_CARE_PROVIDER_SITE_OTHER): Payer: Medicare Other | Admitting: Cardiovascular Disease

## 2018-01-15 ENCOUNTER — Encounter: Payer: Self-pay | Admitting: Cardiovascular Disease

## 2018-01-15 VITALS — BP 130/64 | HR 63 | Ht 67.0 in | Wt 150.8 lb

## 2018-01-15 DIAGNOSIS — E782 Mixed hyperlipidemia: Secondary | ICD-10-CM | POA: Diagnosis not present

## 2018-01-15 DIAGNOSIS — I350 Nonrheumatic aortic (valve) stenosis: Secondary | ICD-10-CM

## 2018-01-15 DIAGNOSIS — I2510 Atherosclerotic heart disease of native coronary artery without angina pectoris: Secondary | ICD-10-CM | POA: Diagnosis not present

## 2018-01-15 DIAGNOSIS — I6523 Occlusion and stenosis of bilateral carotid arteries: Secondary | ICD-10-CM | POA: Diagnosis not present

## 2018-01-15 DIAGNOSIS — I739 Peripheral vascular disease, unspecified: Secondary | ICD-10-CM | POA: Diagnosis not present

## 2018-01-15 NOTE — Progress Notes (Signed)
Cardiology Office Note:    Date:  01/15/2018   ID:  Henry [name], DOB 28-May-1924, MRN [id number]  PCP:  Noralee Space, MD  Cardiologist:  Sherren Mocha, MD  Electrophysiologist:  None   Referring MD: Noralee Space, MD   Chief Complaint  Patient presents with  . Coronary Artery Disease   History of Present Illness:    Henry [name] is a 84 y.o. male with a hx of coronary and peripheral arterial disease, and moderate aortic stenosis, presenting for follow-up evaluation.  The patient is undergone remote multivessel CABG.  He has known lower extremity peripheral arterial disease and is been treated medically in the setting of advanced age and chronic kidney disease.  He is followed by Dr. Fletcher Anon.  The patient is here alone today.  He is been doing fairly well.  He does complain of bilateral calf claudication with about 1 city block.  He stops and rest with resolution of symptoms.  He does not feel like there is been much progression of his symptoms since last year.  He denies resting pain in his legs.  He has had no ulceration or nonhealing wounds on the feet.  The patient denies chest pain, chest pressure, or shortness of breath.  He is compliant with his medications.  Past Medical History:  Diagnosis Date  . CAD of autologous bypass graft   . Carotid artery disease ([place])   . Diabetes mellitus without complication ([place])   . DJD (degenerative joint disease)   . History of anemia of chronic disease   . Hypercholesteremia   . Hypertension   . PVD (peripheral vascular disease) with claudication ([place])   . Renal insufficiency     Past Surgical History:  Procedure Laterality Date  . cataract surgery    . COLONOSCOPY     Hx: of  . CORONARY ARTERY BYPASS GRAFT  2000   by Dr. Cyndia Bent  . LUMBAR LAMINECTOMY/DECOMPRESSION MICRODISCECTOMY N/A 09/10/2012   Procedure: LUMBAR DECOMPRESSION,  IN SITU FUSION LUMBAR 4-5;  Surgeon: Melina Schools, MD;  Location: [facility];  Service: Orthopedics;   Laterality: N/A;  . TONSILLECTOMY      Current Medications: Current Meds  Medication Sig  . amLODipine (NORVASC) 10 MG tablet Take 10 mg by mouth daily.   Marland Kitchen aspirin EC 81 MG tablet Take 81 mg by mouth every morning.   Marland Kitchen atorvastatin (LIPITOR) 20 MG tablet Take 10 mg by mouth every evening.   . Calcium Carbonate-Vitamin D (CALCIUM 600/VITAMIN D PO) Take 1 tablet by mouth 2 (two) times daily.   . Carboxymethylcellulose Sodium (EYE DROPS OP) Apply to eye. OTC Ared 2 [employer]- takes each morning  . cilostazol (PLETAL) 100 MG tablet Take 1 tablet (100 mg total) by mouth 2 (two) times daily.  . ferrous gluconate (FERGON) 325 MG tablet Take 325 mg by mouth 2 (two) times daily.   . furosemide (LASIX) 40 MG tablet Take 40 mg by mouth daily.  Marland Kitchen glucose blood (ACCU-CHEK AVIVA PLUS) test strip CHECK BLOOD SUGAR TWICE A DAY.  Marland Kitchen Lancets (ACCU-CHEK MULTICLIX) lancets USE TWICE DAILY.  Marland Kitchen levothyroxine (SYNTHROID, LEVOTHROID) 50 MCG tablet TAKE ONE TABLET BY MOUTH ONCE DAILY BEFORE BREAKFAST  . metoprolol tartrate (LOPRESSOR) 25 MG tablet Take 1 tablet (25 mg total) by mouth 2 (two) times daily.  . Multiple Vitamins-Minerals (CENTRUM SILVER PO) Take 1 tablet by mouth every morning.      Allergies:   Patient has no known allergies.  Social History   Socioeconomic History  . Marital status: Married    Spouse name: [name]  . Number of children: 5  . Years of education: Not on file  . Highest education level: Not on file  Occupational History  . Occupation: retired    Fish farm manager: RETIRED  Social Needs  . Financial resource strain: Not on file  . Food insecurity:    Worry: Not on file    Inability: Not on file  . Transportation needs:    Medical: Not on file    Non-medical: Not on file  Tobacco Use  . Smoking status: Former Smoker    Packs/day: 1.00    Years: 10.00    Pack years: 10.00    Types: Cigarettes    Last attempt to quit: 02/26/1953    Years since quitting: 64.9  .  Smokeless tobacco: Never Used  Substance and Sexual Activity  . Alcohol use: Yes    Alcohol/week: 0.0 standard drinks    Comment: occasional   . Drug use: No  . Sexual activity: Not on file  Lifestyle  . Physical activity:    Days per week: Not on file    Minutes per session: Not on file  . Stress: Not on file  Relationships  . Social connections:    Talks on phone: Not on file    Gets together: Not on file    Attends religious service: Not on file    Active member of club or organization: Not on file    Attends meetings of clubs or organizations: Not on file    Relationship status: Not on file  Other Topics Concern  . Not on file  Social History Narrative  . Not on file     Family History: The patient's family history is not on file.  ROS:   Please see the history of present illness.    Positive for muscle pain.  All other systems reviewed and are negative.  EKGs/Labs/Other Studies Reviewed:    The following studies were reviewed today: Echo 10-30-2017: Left ventricle:  The cavity size was normal. Wall thickness was normal. Systolic function was normal. The estimated ejection fraction was in the range of 60% to 65%. Doppler parameters are consistent with both elevated ventricular end-diastolic filling pressure and elevated left atrial filling pressure.  ------------------------------------------------------------------- Aortic valve:   Trileaflet; moderately thickened, severely calcified leaflets.  Doppler:   There was moderate stenosis. There was trivial regurgitation.    VTI ratio of LVOT to aortic valve: 0.24. Valve area (VTI): 1.08 cm^2. Indexed valve area (VTI): 0.59 cm^2/m^2. Peak velocity ratio of LVOT to aortic valve: 0.26. Valve area (Vmax): 1.17 cm^2. Indexed valve area (Vmax): 0.64 cm^2/m^2. Mean velocity ratio of LVOT to aortic valve: 0.25. Valve area (Vmean): 1.14 cm^2. Indexed valve area (Vmean): 0.62 cm^2/m^2.    Mean gradient (S): 22 mm Hg. Peak  gradient (S): 39 mm Hg.  ------------------------------------------------------------------- Aorta:  Ascending aorta: The ascending aorta was mildly dilated.  ------------------------------------------------------------------- Mitral valve:   Mildly thickened leaflets .  Doppler:  There was mild regurgitation.    Valve area by pressure half-time: 4.07 cm^2. Indexed valve area by pressure half-time: 2.22 cm^2/m^2. Valve area by continuity equation (using LVOT flow): 2.75 cm^2. Indexed valve area by continuity equation (using LVOT flow): 1.5 cm^2/m^2. Mean gradient (D): 6 mm Hg. Peak gradient (D): 7 mm Hg.  ------------------------------------------------------------------- Left atrium:  The atrium was at the upper limits of normal in size.   ------------------------------------------------------------------- Atrial  septum:  No defect or patent foramen ovale was identified.   ------------------------------------------------------------------- Right ventricle:  The cavity size was normal. Wall thickness was normal. Systolic function was normal.  ------------------------------------------------------------------- Pulmonic valve:    Doppler:  There was mild regurgitation.   ------------------------------------------------------------------- Right atrium:  The atrium was normal in size.  ------------------------------------------------------------------- Pericardium:  The pericardium was normal in appearance.  ------------------------------------------------------------------- Systemic veins: Inferior vena cava: The vessel was normal in size. The respirophasic diameter changes were in the normal range (>= 50%), consistent with normal central venous pressure.  ------------------------------------------------------------------- Measurements   Left ventricle                            Value          Reference  LV ID, ED, PLAX chordal                   44.2  mm       43 - 52   LV ID, ES, PLAX chordal                   30.5  mm       23 - 38  LV fx shortening, PLAX chordal            31    %        >=29  LV PW thickness, ED                       9.58  mm       ---------  IVS/LV PW ratio, ED                       1.03           <=1.3  Stroke volume, 2D                         87    ml       ---------  Stroke volume/bsa, 2D                     47    ml/m^2   ---------  LV e&', lateral                            5.98  cm/s     ---------  LV E/e&', lateral                          22.24          ---------  LV e&', medial                             4.24  cm/s     ---------  LV E/e&', medial                           31.37          ---------  LV e&', average                            5.11  cm/s     ---------  LV E/e&', average  26.03          ---------    Ventricular septum                        Value          Reference  IVS thickness, ED                         9.82  mm       ---------    LVOT                                      Value          Reference  LVOT ID, S                                24    mm       ---------  LVOT area                                 4.52  cm^2     ---------  LVOT ID                                   24    mm       ---------  LVOT peak velocity, S                     80.4  cm/s     ---------  LVOT mean velocity, S                     56.3  cm/s     ---------  LVOT VTI, S                               19.2  cm       ---------  Stroke volume (SV), LVOT DP               86.9  ml       ---------  Stroke index (SV/bsa), LVOT DP            47.4  ml/m^2   ---------    Aortic valve                              Value          Reference  Aortic valve peak velocity, S             311   cm/s     ---------  Aortic valve mean velocity, S             223   cm/s     ---------  Aortic valve VTI, S                       80    cm       ---------  Aortic mean gradient, S  22    mm Hg    ---------  Aortic peak  gradient, S                   39    mm Hg    ---------  VTI ratio, LVOT/AV                        0.24           ---------  Aortic valve area, VTI                    1.08  cm^2     ---------  Aortic valve area/bsa, VTI                0.59  cm^2/m^2 ---------  Velocity ratio, peak, LVOT/AV             0.26           ---------  Aortic valve area, peak velocity          1.17  cm^2     ---------  Aortic valve area/bsa, peak               0.64  cm^2/m^2 ---------  velocity  Velocity ratio, mean, LVOT/AV             0.25           ---------  Aortic valve area, mean velocity          1.14  cm^2     ---------  Aortic valve area/bsa, mean               0.62  cm^2/m^2 ---------  velocity    Aorta                                     Value          Reference  Aortic root ID, ED                        31    mm       ---------  Ascending aorta ID, A-P, S                38    mm       ---------    Left atrium                               Value          Reference  LA ID, A-P, ES                            37    mm       ---------  LA ID/bsa, A-P                            2.02  cm/m^2   <=2.2  LA volume, S                              58.9  ml       ---------  LA volume/bsa, S  32.2  ml/m^2   ---------  LA volume, ES, 1-p A4C                    53.6  ml       ---------  LA volume/bsa, ES, 1-p A4C                29.3  ml/m^2   ---------  LA volume, ES, 1-p A2C                    63.4  ml       ---------  LA volume/bsa, ES, 1-p A2C                34.6  ml/m^2   ---------    Mitral valve                              Value          Reference  Mitral E-wave peak velocity               133   cm/s     ---------  Mitral A-wave peak velocity               151   cm/s     ---------  Mitral mean velocity, D                   120   cm/s     ---------  Mitral deceleration time                  187   ms       150 - 230  Mitral pressure half-time                 54    ms       ---------   Mitral mean gradient, D                   6     mm Hg    ---------  Mitral peak gradient, D                   7     mm Hg    ---------  Mitral E/A ratio, peak                    0.9            ---------  Mitral valve area, PHT, DP                4.07  cm^2     ---------  Mitral valve area/bsa, PHT, DP            2.22  cm^2/m^2 ---------  Mitral valve area, LVOT                   2.75  cm^2     ---------  continuity  Mitral valve area/bsa, LVOT               1.5   cm^2/m^2 ---------  continuity  Mitral annulus VTI, D                     31.6  cm       ---------    Pulmonary arteries  Value          Reference  PA pressure, S, DP                        18    mm Hg    <=30    Tricuspid valve                           Value          Reference  Tricuspid regurg peak velocity            191   cm/s     ---------  Tricuspid peak RV-RA gradient             15    mm Hg    ---------    Right atrium                              Value          Reference  RA ID, S-I, ES                            56.4  mm       ---------  RA ID, M-L, ES, A4C                       30.5  mm       30 - 46    Systemic veins                            Value          Reference  Estimated CVP                             3     mm Hg    ---------    Right ventricle                           Value          Reference  RV ID, minor axis, ED, A4C base           29.6  mm       ---------  TAPSE                                     14.6  mm       ---------  RV pressure, S, DP                        18    mm Hg    <=30  RV s&', lateral, S                         7.83  cm/s     ---------   EKG:  EKG is ordered today.  The ekg ordered today demonstrates normal sinus rhythm with first-degree AV block, heart rate 63 bpm, LVH with repolarization abnormality, possible age-indeterminate inferior infarct.  Recent Labs: 01/01/2018: ALT 12; BUN 35; Creatinine, Ser 2.48; Hemoglobin 10.4; Platelets 272.0; Potassium 5.6;  Sodium  134; TSH 4.34  Recent Lipid Panel    Component Value Date/Time   CHOL 164 01/22/2017 1155   TRIG 110.0 01/22/2017 1155   HDL 45.20 01/22/2017 1155   CHOLHDL 4 01/22/2017 1155   VLDL 22.0 01/22/2017 1155   LDLCALC 97 01/22/2017 1155   LDLDIRECT 96.2 04/04/2007 1049    Physical Exam:    VS:  BP 130/64   Pulse 63   Ht 5\' 7"  (1.702 m)   Wt 150 lb 12.8 oz (68.4 kg)   SpO2 99%   BMI 23.62 kg/m     Wt Readings from Last 3 Encounters:  01/15/18 150 lb 12.8 oz (68.4 kg)  01/01/18 150 lb 12.8 oz (68.4 kg)  07/24/17 151 lb 12.8 oz (68.9 kg)     GEN: Well nourished, well developed elderly male in no acute distress HEENT: Normal NECK: No JVD; No carotid bruits LYMPHATICS: No lymphadenopathy CARDIAC: RRR, 3/6 mid peaking harsh crescendo decrescendo murmur at the RUSB/apex RESPIRATORY:  Clear to auscultation without rales, wheezing or rhonchi  ABDOMEN: Soft, non-tender, non-distended MUSCULOSKELETAL:  No edema; No deformity  SKIN: Warm and dry NEUROLOGIC:  Alert and oriented x 3 PSYCHIATRIC:  Normal affect   ASSESSMENT:    1. Aortic valve stenosis, etiology of cardiac valve disease unspecified   2. Claudication ([place])   3. Coronary artery disease involving native coronary artery of native heart without angina pectoris   4. Mixed hyperlipidemia    PLAN:    In order of problems listed above:  1. Echo data and physical exam consistent with moderate aortic stenosis.  Recommend repeat echo next year prior to his office visit.  We discussed potential symptoms related to aortic stenosis. 2. Stable claudication symptoms.  Appreciate Dr. Tyrell Antonio care of this patient.  The patient is treated with cilostazol.  Aggressive secondary risk reduction measures as outlined. 3. No anginal symptoms.  Patient is treated with aspirin for antiplatelet therapy, statin drug, and a beta-blocker. 4. Treated with atorvastatin most recent lipids reviewed.  Medication Adjustments/Labs and Tests  Ordered: Current medicines are reviewed at length with the patient today.  Concerns regarding medicines are outlined above.  Orders Placed This Encounter  Procedures  . EKG 12-Lead  . ECHOCARDIOGRAM COMPLETE   No orders of the defined types were placed in this encounter.   Patient Instructions  Medication Instructions:  Your provider recommends that you continue on your current medications as directed. Please refer to the Current Medication list given to you today.    Labwork: None  Testing/Procedures: Your provider has requested that you have an echocardiogram in 1 year. Echocardiography is a painless test that uses sound waves to create images of your heart. It provides your doctor with information about the size and shape of your heart and how well your heart's chambers and valves are working. This procedure takes approximately one hour. There are no restrictions for this procedure.    Follow-Up: Your provider recommends that you schedule a follow-up appointment in 1 year with Dr. Burt Knack. You will be called to schedule your echo and office visit.  Any Other Special Instructions Will Be Listed Below (If Applicable).     If you need a refill on your cardiac medications before your next appointment, please call your pharmacy.      Signed, Sherren Mocha, MD  01/15/2018 2:19 PM    [facility]

## 2018-01-15 NOTE — Patient Instructions (Signed)
Medication Instructions:  Your provider recommends that you continue on your current medications as directed. Please refer to the Current Medication list given to you today.    Labwork: None  Testing/Procedures: Your provider has requested that you have an echocardiogram in 1 year. Echocardiography is a painless test that uses sound waves to create images of your heart. It provides your doctor with information about the size and shape of your heart and how well your heart's chambers and valves are working. This procedure takes approximately one hour. There are no restrictions for this procedure.    Follow-Up: Your provider recommends that you schedule a follow-up appointment in 1 year with Dr. Burt Knack. You will be called to schedule your echo and office visit.  Any Other Special Instructions Will Be Listed Below (If Applicable).     If you need a refill on your cardiac medications before your next appointment, please call your pharmacy.

## 2018-01-16 DIAGNOSIS — N189 Chronic kidney disease, unspecified: Secondary | ICD-10-CM | POA: Diagnosis not present

## 2018-01-16 DIAGNOSIS — R809 Proteinuria, unspecified: Secondary | ICD-10-CM | POA: Diagnosis not present

## 2018-01-16 DIAGNOSIS — I129 Hypertensive chronic kidney disease with stage 1 through stage 4 chronic kidney disease, or unspecified chronic kidney disease: Secondary | ICD-10-CM | POA: Diagnosis not present

## 2018-01-16 DIAGNOSIS — N184 Chronic kidney disease, stage 4 (severe): Secondary | ICD-10-CM | POA: Diagnosis not present

## 2018-01-16 DIAGNOSIS — I739 Peripheral vascular disease, unspecified: Secondary | ICD-10-CM | POA: Diagnosis not present

## 2018-01-16 DIAGNOSIS — N179 Acute kidney failure, unspecified: Secondary | ICD-10-CM | POA: Diagnosis not present

## 2018-01-16 DIAGNOSIS — D631 Anemia in chronic kidney disease: Secondary | ICD-10-CM | POA: Diagnosis not present

## 2018-01-16 DIAGNOSIS — E1122 Type 2 diabetes mellitus with diabetic chronic kidney disease: Secondary | ICD-10-CM | POA: Diagnosis not present

## 2018-01-21 ENCOUNTER — Telehealth: Payer: Self-pay | Admitting: Pulmonary Disease

## 2018-01-21 NOTE — Telephone Encounter (Signed)
Called and spoke with Patient.  CXR results and recommendations given.  Patient stated understanding.  Nothing further at this time.  Notes recorded by Noralee Space, MD on 01/20/2018 at 2:22 PM EST CXR shows some incr marking R>L base, min worse serially... CT Abd done 04/2017 showed some interstitial changes suggesting poss UIP... He is more symptomatic from heart & OA=> continue presnet meds & observation.Marland KitchenMarland Kitchen

## 2018-01-27 ENCOUNTER — Other Ambulatory Visit: Payer: Self-pay

## 2018-01-27 DIAGNOSIS — N185 Chronic kidney disease, stage 5: Secondary | ICD-10-CM

## 2018-02-07 ENCOUNTER — Ambulatory Visit (INDEPENDENT_AMBULATORY_CARE_PROVIDER_SITE_OTHER): Payer: Medicare Other | Admitting: Internal Medicine

## 2018-02-07 ENCOUNTER — Encounter: Payer: Self-pay | Admitting: Internal Medicine

## 2018-02-07 VITALS — BP 124/62 | HR 62 | Temp 98.0°F | Ht 67.0 in | Wt 152.0 lb

## 2018-02-07 DIAGNOSIS — N184 Chronic kidney disease, stage 4 (severe): Secondary | ICD-10-CM | POA: Diagnosis not present

## 2018-02-07 DIAGNOSIS — E119 Type 2 diabetes mellitus without complications: Secondary | ICD-10-CM

## 2018-02-07 DIAGNOSIS — E039 Hypothyroidism, unspecified: Secondary | ICD-10-CM | POA: Diagnosis not present

## 2018-02-07 DIAGNOSIS — I6523 Occlusion and stenosis of bilateral carotid arteries: Secondary | ICD-10-CM

## 2018-02-07 DIAGNOSIS — I10 Essential (primary) hypertension: Secondary | ICD-10-CM

## 2018-02-07 NOTE — Patient Instructions (Addendum)
Please continue all other medications as before, and refills have been done if requested.  Please have the pharmacy call with any other refills you may need.  Please continue your efforts at being more active, low cholesterol diet, and weight control.  You are otherwise up to date with prevention measures today.  Please keep your appointments with your specialists as you may have planned  Please return in 6 months, or sooner if needed 

## 2018-02-07 NOTE — Assessment & Plan Note (Signed)
stable overall by history and exam, recent data reviewed with pt, and pt to continue medical treatment as before,  to f/u any worsening symptoms or concerns  

## 2018-02-07 NOTE — Progress Notes (Signed)
Subjective:    Patient ID: Henry [name], male    DOB: 1924-12-21, 84 y.o.   MRN: [id number]  HPI Here to f/u; overall doing ok,  Pt denies chest pain, increasing sob or doe, wheezing, orthopnea, PND, increased LE swelling, palpitations, dizziness or syncope.  Pt denies new neurological symptoms such as new headache, or facial or extremity weakness or numbness.  Pt denies polydipsia, polyuria, or low sugar episode.  Pt states overall good compliance with meds, mostly trying to follow appropriate diet, with wt overall stable,  but little exercise however. Has stable claudication to bilat LE's with walking < 510ft. Sees Renal Dr Vanita Panda every 6 mo. No new complaints  Denies hyper or hypo thyroid symptoms such as voice, skin or hair change. Past Medical History:  Diagnosis Date  . CAD of autologous bypass graft   . Carotid artery disease ([place])   . Diabetes mellitus without complication ([place])   . DJD (degenerative joint disease)   . History of anemia of chronic disease   . Hypercholesteremia   . Hypertension   . PVD (peripheral vascular disease) with claudication ([place])   . Renal insufficiency    Past Surgical History:  Procedure Laterality Date  . cataract surgery    . COLONOSCOPY     Hx: of  . CORONARY ARTERY BYPASS GRAFT  2000   by Dr. Cyndia Bent  . LUMBAR LAMINECTOMY/DECOMPRESSION MICRODISCECTOMY N/A 09/10/2012   Procedure: LUMBAR DECOMPRESSION,  IN SITU FUSION LUMBAR 4-5;  Surgeon: Melina Schools, MD;  Location: [facility];  Service: Orthopedics;  Laterality: N/A;  . TONSILLECTOMY      reports that he quit smoking about 64 years ago. His smoking use included cigarettes. He has a 10.00 pack-year smoking history. He has never used smokeless tobacco. He reports current alcohol use. He reports that he does not use drugs. family history is not on file. No Known Allergies Current Outpatient Medications on File Prior to Visit  Medication Sig Dispense Refill  . amLODipine (NORVASC) 10 MG tablet  Take 10 mg by mouth daily.     Marland Kitchen aspirin EC 81 MG tablet Take 81 mg by mouth every morning.     Marland Kitchen atorvastatin (LIPITOR) 20 MG tablet Take 10 mg by mouth every evening.     . Calcium Carbonate-Vitamin D (CALCIUM 600/VITAMIN D PO) Take 1 tablet by mouth 2 (two) times daily.     . Carboxymethylcellulose Sodium (EYE DROPS OP) Apply to eye. OTC Ared 2 Presser Vision- takes each morning    . cilostazol (PLETAL) 100 MG tablet Take 1 tablet (100 mg total) by mouth 2 (two) times daily. 90 tablet 3  . ferrous gluconate (FERGON) 325 MG tablet Take 325 mg by mouth 2 (two) times daily.     . furosemide (LASIX) 40 MG tablet Take 40 mg by mouth daily.    Marland Kitchen glucose blood (ACCU-CHEK AVIVA PLUS) test strip CHECK BLOOD SUGAR TWICE A DAY. 50 each 5  . Lancets (ACCU-CHEK MULTICLIX) lancets USE TWICE DAILY. 102 each 5  . levothyroxine (SYNTHROID, LEVOTHROID) 50 MCG tablet TAKE ONE TABLET BY MOUTH ONCE DAILY BEFORE BREAKFAST 90 tablet 3  . metoprolol tartrate (LOPRESSOR) 25 MG tablet Take 1 tablet (25 mg total) by mouth 2 (two) times daily. 60 tablet 5  . Multiple Vitamins-Minerals (CENTRUM SILVER PO) Take 1 tablet by mouth every morning.      No current facility-administered medications on file prior to visit.    Review of Systems  Constitutional: Negative for  other unusual diaphoresis or sweats HENT: Negative for ear discharge or swelling Eyes: Negative for other worsening visual disturbances Respiratory: Negative for stridor or other swelling  Gastrointestinal: Negative for worsening distension or other blood Genitourinary: Negative for retention or other urinary change Musculoskeletal: Negative for other MSK pain or swelling Skin: Negative for color change or other new lesions Neurological: Negative for worsening tremors and other numbness  Psychiatric/Behavioral: Negative for worsening agitation or other fatigue All other system neg per pt    Objective   Physical Exam BP 124/62   Pulse 62   Temp 98  F (36.7 C) (Oral)   Ht 5\' 7"  (1.702 m)   Wt 152 lb (68.9 kg)   SpO2 95%   BMI 23.81 kg/m  VS noted,  Constitutional: Pt appears in NAD HENT: Head: NCAT.  Right Ear: External ear normal.  Left Ear: External ear normal.  Eyes: . Pupils are equal, round, and reactive to light. Conjunctivae and EOM are normal Nose: without d/c or deformity Neck: Neck supple. Gross normal ROM Cardiovascular: Normal rate and regular rhythm.   Pulmonary/Chest: Effort normal and breath sounds decreased without rales or wheezing.  Abd:  Soft, NT, ND, + BS, no organomegaly Neurological: Pt is alert. At baseline orientation, motor grossly intact Skin: Skin is warm. No rashes, other new lesions, no LE edema Psychiatric: Pt behavior is normal without agitation  No other exam findings  Lab Results  Component Value Date   WBC 7.1 01/01/2018   HGB 10.4 (L) 01/01/2018   HCT 30.3 (L) 01/01/2018   PLT 272.0 01/01/2018   GLUCOSE 95 01/01/2018   CHOL 164 01/22/2017   TRIG 110.0 01/22/2017   HDL 45.20 01/22/2017   LDLDIRECT 96.2 04/04/2007   LDLCALC 97 01/22/2017   ALT 12 01/01/2018   AST 20 01/01/2018   NA 134 (L) 01/01/2018   K 5.6 (H) 01/01/2018   CL 101 01/01/2018   CREATININE 2.48 (H) 01/01/2018   BUN 35 (H) 01/01/2018   CO2 26 01/01/2018   TSH 4.34 01/01/2018   PSA 1.47 01/03/2010   INR 1.02 04/10/2012   HGBA1C 6.2 01/01/2018   MICROALBUR 7.8 (H) 06/08/2008       Assessment & Plan:

## 2018-02-20 ENCOUNTER — Ambulatory Visit (HOSPITAL_COMMUNITY)
Admission: RE | Admit: 2018-02-20 | Discharge: 2018-02-20 | Disposition: A | Discharge status: Home / Self Care | Payer: Medicare Other | Source: Ambulatory Visit | Attending: Surgery | Admitting: Surgery

## 2018-02-20 ENCOUNTER — Ambulatory Visit (INDEPENDENT_AMBULATORY_CARE_PROVIDER_SITE_OTHER): Payer: Medicare Other | Admitting: Surgery

## 2018-02-20 ENCOUNTER — Ambulatory Visit (INDEPENDENT_AMBULATORY_CARE_PROVIDER_SITE_OTHER)
Admission: RE | Admit: 2018-02-20 | Discharge: 2018-02-20 | Disposition: A | Discharge status: Home / Self Care | Payer: Medicare Other | Source: Ambulatory Visit | Attending: Surgery | Admitting: Surgery

## 2018-02-20 ENCOUNTER — Other Ambulatory Visit: Payer: Self-pay

## 2018-02-20 ENCOUNTER — Other Ambulatory Visit: Payer: Self-pay | Admitting: *Deleted

## 2018-02-20 ENCOUNTER — Encounter: Payer: Self-pay | Admitting: *Deleted

## 2018-02-20 ENCOUNTER — Encounter: Payer: Self-pay | Admitting: Surgery

## 2018-02-20 VITALS — BP 143/64 | HR 76 | Temp 97.3°F | Resp 18 | Ht 67.0 in | Wt 152.0 lb

## 2018-02-20 DIAGNOSIS — I6523 Occlusion and stenosis of bilateral carotid arteries: Secondary | ICD-10-CM | POA: Diagnosis not present

## 2018-02-20 DIAGNOSIS — N185 Chronic kidney disease, stage 5: Secondary | ICD-10-CM

## 2018-02-20 DIAGNOSIS — N184 Chronic kidney disease, stage 4 (severe): Secondary | ICD-10-CM

## 2018-02-20 NOTE — H&P (View-Only) (Signed)
[facility]  Patient name: Henry [name] MRN: [id number] DOB: May 18, 1924 Sex: male   REQUESTING PROVIDER:    Dr. Marval Regal   REASON FOR CONSULT:    Dialysis access  HISTORY OF PRESENT ILLNESS:   Henry [name] is a 84 y.o. male, who is referred today for dialysis access.  He is right-handed.  He has a history of coronary and peripheral vascular disease as well as moderate aortic stenosis.  He has a history of CABG in the remote past.  He is medically managed for hypertension.  He takes a statin for hypercholesterolemia.  PAST MEDICAL HISTORY    Past Medical History:  Diagnosis Date  . CAD of autologous bypass graft   . Carotid artery disease ([place])   . Diabetes mellitus without complication ([place])   . DJD (degenerative joint disease)   . History of anemia of chronic disease   . Hypercholesteremia   . Hypertension   . PVD (peripheral vascular disease) with claudication ([place])   . Renal insufficiency      FAMILY HISTORY   History reviewed. No pertinent family history.  SOCIAL HISTORY:   Social History   Socioeconomic History  . Marital status: Married    Spouse name: [name]  . Number of children: 5  . Years of education: Not on file  . Highest education level: Not on file  Occupational History  . Occupation: retired    Fish farm manager: RETIRED  Social Needs  . Financial resource strain: Not on file  . Food insecurity:    Worry: Not on file    Inability: Not on file  . Transportation needs:    Medical: Not on file    Non-medical: Not on file  Tobacco Use  . Smoking status: Former Smoker    Packs/day: 1.00    Years: 10.00    Pack years: 10.00    Types: Cigarettes    Last attempt to quit: 02/26/1953    Years since quitting: 65.0  . Smokeless tobacco: Never Used  Substance and Sexual Activity  . Alcohol use: Yes    Alcohol/week: 0.0 standard drinks    Comment: occasional   . Drug use: No    . Sexual activity: Not on file  Lifestyle  . Physical activity:    Days per week: Not on file    Minutes per session: Not on file  . Stress: Not on file  Relationships  . Social connections:    Talks on phone: Not on file    Gets together: Not on file    Attends religious service: Not on file    Active member of club or organization: Not on file    Attends meetings of clubs or organizations: Not on file    Relationship status: Not on file  . Intimate partner violence:    Fear of current or ex partner: Not on file    Emotionally abused: Not on file    Physically abused: Not on file    Forced sexual activity: Not on file  Other Topics Concern  . Not on file  Social History Narrative  . Not on file    ALLERGIES:    No Known Allergies  CURRENT MEDICATIONS:    Current Outpatient Medications  Medication Sig Dispense Refill  . amLODipine (NORVASC) 10 MG tablet Take 10 mg by mouth daily.     Marland Kitchen aspirin EC 81 MG tablet Take 81 mg by mouth every morning.     Marland Kitchen  atorvastatin (LIPITOR) 20 MG tablet Take 10 mg by mouth every evening.     . Calcium Carbonate-Vitamin D (CALCIUM 600/VITAMIN D PO) Take 1 tablet by mouth 2 (two) times daily.     . Carboxymethylcellulose Sodium (EYE DROPS OP) Apply to eye. OTC Ared 2 Presser Vision- takes each morning    . cilostazol (PLETAL) 100 MG tablet Take 1 tablet (100 mg total) by mouth 2 (two) times daily. 90 tablet 3  . ferrous gluconate (FERGON) 325 MG tablet Take 325 mg by mouth 2 (two) times daily.     . furosemide (LASIX) 40 MG tablet Take 40 mg by mouth daily.    Marland Kitchen glucose blood (ACCU-CHEK AVIVA PLUS) test strip CHECK BLOOD SUGAR TWICE A DAY. 50 each 5  . Lancets (ACCU-CHEK MULTICLIX) lancets USE TWICE DAILY. 102 each 5  . levothyroxine (SYNTHROID, LEVOTHROID) 50 MCG tablet TAKE ONE TABLET BY MOUTH ONCE DAILY BEFORE BREAKFAST 90 tablet 3  . metoprolol tartrate (LOPRESSOR) 25 MG tablet Take 1 tablet (25 mg total) by mouth 2 (two) times daily.  60 tablet 5  . Multiple Vitamins-Minerals (CENTRUM SILVER PO) Take 1 tablet by mouth every morning.      No current facility-administered medications for this visit.     REVIEW OF SYSTEMS:   [X]  denotes positive finding, [ ]  denotes negative finding Cardiac  Comments:  Chest pain or chest pressure:    Shortness of breath upon exertion: x   Short of breath when lying flat:    Irregular heart rhythm:        Vascular    Pain in calf, thigh, or hip brought on by ambulation: x   Pain in feet at night that wakes you up from your sleep:     Blood clot in your veins:    Leg swelling:  x       Pulmonary    Oxygen at home:    Productive cough:     Wheezing:         Neurologic    Sudden weakness in arms or legs:     Sudden numbness in arms or legs:     Sudden onset of difficulty speaking or slurred speech:    Temporary loss of vision in one eye:     Problems with dizziness:         Gastrointestinal    Blood in stool:      Vomited blood:         Genitourinary    Burning when urinating:     Blood in urine:        Psychiatric    Major depression:         Hematologic    Bleeding problems:    Problems with blood clotting too easily:        Skin    Rashes or ulcers:        Constitutional    Fever or chills:     PHYSICAL EXAM:   Vitals:   02/20/18 1145  BP: (!) 143/64  Pulse: 76  Resp: 18  Temp: (!) 97.3 F (36.3 C)  SpO2: 100%  Weight: 152 lb (68.9 kg)  Height: 5\' 7"  (1.702 m)    GENERAL: The patient is a well-nourished male, in no acute distress. The vital signs are documented above. CARDIAC: There is a regular rate and rhythm.  VASCULAR: Palpable brachial radial pulse on the left PULMONARY: Nonlabored respirations MUSCULOSKELETAL: There are no major deformities or cyanosis. NEUROLOGIC: No focal weakness  or paresthesias are detected. SKIN: There are no ulcers or rashes noted. PSYCHIATRIC: The patient has a normal affect.  STUDIES:   I have reviewed the  following studies; +-----------------+-------------+----------+--------+ Right Cephalic   Diameter (cm)Depth (cm)Findings +-----------------+-------------+----------+--------+ Shoulder             0.27                        +-----------------+-------------+----------+--------+ Prox upper arm       0.24                        +-----------------+-------------+----------+--------+ Mid upper arm        0.20                        +-----------------+-------------+----------+--------+ Dist upper arm       0.24                        +-----------------+-------------+----------+--------+ Antecubital fossa    0.27                        +-----------------+-------------+----------+--------+ Prox forearm         0.25                        +-----------------+-------------+----------+--------+ Mid forearm          0.22                        +-----------------+-------------+----------+--------+ Dist forearm         0.15                        +-----------------+-------------+----------+--------+  +-----------------+-------------+----------+--------+ Right Basilic    Diameter (cm)Depth (cm)Findings +-----------------+-------------+----------+--------+ Prox upper arm       0.52                        +-----------------+-------------+----------+--------+ Mid upper arm        0.65                        +-----------------+-------------+----------+--------+ Dist upper arm       0.37                        +-----------------+-------------+----------+--------+ Antecubital fossa    0.26                        +-----------------+-------------+----------+--------+ Prox forearm         0.23                        +-----------------+-------------+----------+--------+  +-----------------+-------------+----------+--------+ Left Cephalic    Diameter (cm)Depth  (cm)Findings +-----------------+-------------+----------+--------+ Shoulder             0.32                        +-----------------+-------------+----------+--------+ Prox upper arm       0.27                        +-----------------+-------------+----------+--------+ Mid upper arm        0.30                        +-----------------+-------------+----------+--------+  Dist upper arm       0.32                        +-----------------+-------------+----------+--------+ Antecubital fossa    0.37                        +-----------------+-------------+----------+--------+ Prox forearm         0.32                        +-----------------+-------------+----------+--------+ Mid forearm          0.25                        +-----------------+-------------+----------+--------+ Dist forearm         0.22                        +-----------------+-------------+----------+--------+  +-----------------+-------------+----------+--------+ Left Basilic     Diameter (cm)Depth (cm)Findings +-----------------+-------------+----------+--------+ Prox upper arm       0.50                        +-----------------+-------------+----------+--------+ Mid upper arm        0.50                        +-----------------+-------------+----------+--------+ Dist upper arm       0.42                        +-----------------+-------------+----------+--------+ Antecubital fossa    0.32                        +-----------------+-------------+----------+--------+ Prox forearm         0.27                        +-----------------+-------------+----------+--------+   ASSESSMENT and PLAN   Chronic renal insufficiency: We discussed proceeding with a left brachiocephalic versus a first stage left basilic vein fistula.  He understands that if we used the basilic vein that this would require a second operation.  We discussed the risk of not  maturity and the need for future interventions as well as the risk of steal syndrome.  All his questions were answered.  He has been scheduled for Thursday, January 23.  I will stop his Pletal 1 week before the operation.   Annamarie Major, MD [facility] [phone] Pager [phone]

## 2018-02-20 NOTE — Progress Notes (Signed)
[facility] and [facility]in [facility]ist of [facility]  Patient name: Henry [name] MRN: [id number] DOB: April 08, 1924 Sex: male   REQUESTING PROVIDER:    Dr. Marval Regal   REASON FOR CONSULT:    Dialysis access  HISTORY OF PRESENT ILLNESS:   Henry [name] is a 84 y.o. male, who is referred today for dialysis access.  He is right-handed.  He has a history of coronary and peripheral vascular disease as well as moderate aortic stenosis.  He has a history of CABG in the remote past.  He is medically managed for hypertension.  He takes a statin for hypercholesterolemia.  PAST MEDICAL HISTORY    Past Medical History:  Diagnosis Date  . CAD of autologous bypass graft   . Carotid artery disease ([place])   . Diabetes mellitus without complication ([place])   . DJD (degenerative joint disease)   . History of anemia of chronic disease   . Hypercholesteremia   . Hypertension   . PVD (peripheral vascular disease) with claudication ([place])   . Renal insufficiency      FAMILY HISTORY   History reviewed. No pertinent family history.  SOCIAL HISTORY:   Social History   Socioeconomic History  . Marital status: Married    Spouse name: [name]  . Number of children: 5  . Years of education: Not on file  . Highest education level: Not on file  Occupational History  . Occupation: retired    Fish farm manager: RETIRED  Social Needs  . Financial resource strain: Not on file  . Food insecurity:    Worry: Not on file    Inability: Not on file  . Transportation needs:    Medical: Not on file    Non-medical: Not on file  Tobacco Use  . Smoking status: Former Smoker    Packs/day: 1.00    Years: 10.00    Pack years: 10.00    Types: Cigarettes    Last attempt to quit: 02/26/1953    Years since quitting: 65.0  . Smokeless tobacco: Never Used  Substance and Sexual Activity  . Alcohol use: Yes    Alcohol/week: 0.0 standard drinks    Comment: occasional   . Drug use: No    . Sexual activity: Not on file  Lifestyle  . Physical activity:    Days per week: Not on file    Minutes per session: Not on file  . Stress: Not on file  Relationships  . Social connections:    Talks on phone: Not on file    Gets together: Not on file    Attends religious service: Not on file    Active member of club or organization: Not on file    Attends meetings of clubs or organizations: Not on file    Relationship status: Not on file  . Intimate partner violence:    Fear of current or ex partner: Not on file    Emotionally abused: Not on file    Physically abused: Not on file    Forced sexual activity: Not on file  Other Topics Concern  . Not on file  Social History Narrative  . Not on file    ALLERGIES:    No Known Allergies  CURRENT MEDICATIONS:    Current Outpatient Medications  Medication Sig Dispense Refill  . amLODipine (NORVASC) 10 MG tablet Take 10 mg by mouth daily.     Marland Kitchen aspirin EC 81 MG tablet Take 81 mg by mouth every morning.     Marland Kitchen  atorvastatin (LIPITOR) 20 MG tablet Take 10 mg by mouth every evening.     . Calcium Carbonate-Vitamin D (CALCIUM 600/VITAMIN D PO) Take 1 tablet by mouth 2 (two) times daily.     . Carboxymethylcellulose Sodium (EYE DROPS OP) Apply to eye. OTC Ared 2 Presser Vision- takes each morning    . cilostazol (PLETAL) 100 MG tablet Take 1 tablet (100 mg total) by mouth 2 (two) times daily. 90 tablet 3  . ferrous gluconate (FERGON) 325 MG tablet Take 325 mg by mouth 2 (two) times daily.     . furosemide (LASIX) 40 MG tablet Take 40 mg by mouth daily.    Marland Kitchen glucose blood (ACCU-CHEK AVIVA PLUS) test strip CHECK BLOOD SUGAR TWICE A DAY. 50 each 5  . Lancets (ACCU-CHEK MULTICLIX) lancets USE TWICE DAILY. 102 each 5  . levothyroxine (SYNTHROID, LEVOTHROID) 50 MCG tablet TAKE ONE TABLET BY MOUTH ONCE DAILY BEFORE BREAKFAST 90 tablet 3  . metoprolol tartrate (LOPRESSOR) 25 MG tablet Take 1 tablet (25 mg total) by mouth 2 (two) times daily.  60 tablet 5  . Multiple Vitamins-Minerals (CENTRUM SILVER PO) Take 1 tablet by mouth every morning.      No current facility-administered medications for this visit.     REVIEW OF SYSTEMS:   [X]  denotes positive finding, [ ]  denotes negative finding Cardiac  Comments:  Chest pain or chest pressure:    Shortness of breath upon exertion: x   Short of breath when lying flat:    Irregular heart rhythm:        Vascular    Pain in calf, thigh, or hip brought on by ambulation: x   Pain in feet at night that wakes you up from your sleep:     Blood clot in your veins:    Leg swelling:  x       Pulmonary    Oxygen at home:    Productive cough:     Wheezing:         Neurologic    Sudden weakness in arms or legs:     Sudden numbness in arms or legs:     Sudden onset of difficulty speaking or slurred speech:    Temporary loss of vision in one eye:     Problems with dizziness:         Gastrointestinal    Blood in stool:      Vomited blood:         Genitourinary    Burning when urinating:     Blood in urine:        Psychiatric    Major depression:         Hematologic    Bleeding problems:    Problems with blood clotting too easily:        Skin    Rashes or ulcers:        Constitutional    Fever or chills:     PHYSICAL EXAM:   Vitals:   02/20/18 1145  BP: (!) 143/64  Pulse: 76  Resp: 18  Temp: (!) 97.3 F (36.3 C)  SpO2: 100%  Weight: 152 lb (68.9 kg)  Height: 5\' 7"  (1.702 m)    GENERAL: The patient is a well-nourished male, in no acute distress. The vital signs are documented above. CARDIAC: There is a regular rate and rhythm.  VASCULAR: Palpable brachial radial pulse on the left PULMONARY: Nonlabored respirations MUSCULOSKELETAL: There are no major deformities or cyanosis. NEUROLOGIC: No focal weakness  or paresthesias are detected. SKIN: There are no ulcers or rashes noted. PSYCHIATRIC: The patient has a normal affect.  STUDIES:   I have reviewed the  following studies; +-----------------+-------------+----------+--------+ Right Cephalic   Diameter (cm)Depth (cm)Findings +-----------------+-------------+----------+--------+ Shoulder             0.27                        +-----------------+-------------+----------+--------+ Prox upper arm       0.24                        +-----------------+-------------+----------+--------+ Mid upper arm        0.20                        +-----------------+-------------+----------+--------+ Dist upper arm       0.24                        +-----------------+-------------+----------+--------+ Antecubital fossa    0.27                        +-----------------+-------------+----------+--------+ Prox forearm         0.25                        +-----------------+-------------+----------+--------+ Mid forearm          0.22                        +-----------------+-------------+----------+--------+ Dist forearm         0.15                        +-----------------+-------------+----------+--------+  +-----------------+-------------+----------+--------+ Right Basilic    Diameter (cm)Depth (cm)Findings +-----------------+-------------+----------+--------+ Prox upper arm       0.52                        +-----------------+-------------+----------+--------+ Mid upper arm        0.65                        +-----------------+-------------+----------+--------+ Dist upper arm       0.37                        +-----------------+-------------+----------+--------+ Antecubital fossa    0.26                        +-----------------+-------------+----------+--------+ Prox forearm         0.23                        +-----------------+-------------+----------+--------+  +-----------------+-------------+----------+--------+ Left Cephalic    Diameter (cm)Depth  (cm)Findings +-----------------+-------------+----------+--------+ Shoulder             0.32                        +-----------------+-------------+----------+--------+ Prox upper arm       0.27                        +-----------------+-------------+----------+--------+ Mid upper arm        0.30                        +-----------------+-------------+----------+--------+  Dist upper arm       0.32                        +-----------------+-------------+----------+--------+ Antecubital fossa    0.37                        +-----------------+-------------+----------+--------+ Prox forearm         0.32                        +-----------------+-------------+----------+--------+ Mid forearm          0.25                        +-----------------+-------------+----------+--------+ Dist forearm         0.22                        +-----------------+-------------+----------+--------+  +-----------------+-------------+----------+--------+ Left Basilic     Diameter (cm)Depth (cm)Findings +-----------------+-------------+----------+--------+ Prox upper arm       0.50                        +-----------------+-------------+----------+--------+ Mid upper arm        0.50                        +-----------------+-------------+----------+--------+ Dist upper arm       0.42                        +-----------------+-------------+----------+--------+ Antecubital fossa    0.32                        +-----------------+-------------+----------+--------+ Prox forearm         0.27                        +-----------------+-------------+----------+--------+   ASSESSMENT and PLAN   Chronic renal insufficiency: We discussed proceeding with a left brachiocephalic versus a first stage left basilic vein fistula.  He understands that if we used the basilic vein that this would require a second operation.  We discussed the risk of not  maturity and the need for future interventions as well as the risk of steal syndrome.  All his questions were answered.  He has been scheduled for Thursday, January 23.  I will stop his Pletal 1 week before the operation.   Annamarie Major, MD [facility] [phone] Pager [phone]

## 2018-03-07 DIAGNOSIS — I739 Peripheral vascular disease, unspecified: Secondary | ICD-10-CM | POA: Diagnosis not present

## 2018-03-07 DIAGNOSIS — N179 Acute kidney failure, unspecified: Secondary | ICD-10-CM | POA: Diagnosis not present

## 2018-03-07 DIAGNOSIS — N189 Chronic kidney disease, unspecified: Secondary | ICD-10-CM | POA: Diagnosis not present

## 2018-03-07 DIAGNOSIS — R809 Proteinuria, unspecified: Secondary | ICD-10-CM | POA: Diagnosis not present

## 2018-03-07 DIAGNOSIS — E1122 Type 2 diabetes mellitus with diabetic chronic kidney disease: Secondary | ICD-10-CM | POA: Diagnosis not present

## 2018-03-07 DIAGNOSIS — D631 Anemia in chronic kidney disease: Secondary | ICD-10-CM | POA: Diagnosis not present

## 2018-03-07 DIAGNOSIS — N184 Chronic kidney disease, stage 4 (severe): Secondary | ICD-10-CM | POA: Diagnosis not present

## 2018-03-07 DIAGNOSIS — I129 Hypertensive chronic kidney disease with stage 1 through stage 4 chronic kidney disease, or unspecified chronic kidney disease: Secondary | ICD-10-CM | POA: Diagnosis not present

## 2018-03-19 ENCOUNTER — Encounter (HOSPITAL_COMMUNITY): Payer: Self-pay | Admitting: *Deleted

## 2018-03-19 ENCOUNTER — Other Ambulatory Visit: Payer: Self-pay

## 2018-03-19 NOTE — Progress Notes (Signed)
Anesthesia Chart Review: SAME DAY WORKUP   Case:  [id number] Date/Time:  03/20/18 0715   Procedure:  ARTERIOVENOUS (AV) FISTULA CREATION LEFT ARM (Left )   Anesthesia type:  Monitor Anesthesia Care   Pre-op diagnosis:  CHRONIC KIDNEY DISEASE FOR HEMODIALYSIS ACCESS   Location:  [place] 11 / [facility] OR   Surgeon:  Serafina Mitchell, MD      DISCUSSION:  85 yo male former smoker. 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  Pt follows with Dr. Burt Knack for hx of CAD and moderate AS. Per last OV note 01/15/2018 he is doing fairly well. Known moderate AS, monitoring with yearly echo. Most recent echo 10/30/2017 with AV mean gradient 22 mmHg, Valve area (VTI): 1.08 cm^2.  PVD followed by Dr. Fletcher Anon. Lower extremity peripheral arterial disease and is been treated medically in the setting of advanced age and chronic kidney disease. Per notes he does complain of bilateral calf claudication with about 1 city block.  He stops and rest with resolution of symptoms.  He does not feel like there is been much progression of his symptoms since last year, slightly improved with pletal.  CKD V followed by Dr. Marval Regal, not yet on HD. Will need DOS labs. Anticipate he can proceed as planned barring acute status change.   VS: There were no vitals taken for this visit.  PROVIDERS: [name], MD is PCP  Sherren Mocha, MD is Cardiologist  Kathlyn Sacramento, MD is Cardiologist following PVD  Donato Heinz, MD is Nephrologist  LABS: Will need DOS labs.  IMAGES: CHEST - 2 VIEW 01/06/2018  COMPARISON:  01/22/2017 and 03/07/2016 and 03/23/2015  FINDINGS: The heart size and pulmonary vascularity are normal. CABG. Diffuse interstitial accentuation, slightly progressed since the prior study consistent with pulmonary fibrosis. No infiltrates or effusions. Aortic atherosclerosis. No acute bone abnormality. Multiple old right posterior rib  fractures.  IMPRESSION: 1. Slight progression of chronic interstitial lung disease. 2.  Aortic Atherosclerosis (ICD10-I70.0).   EKG: 01/15/2018: Sinus rhythm with 1st degree AV block. Rate 63. LVH with repol abn. Possible inferior infarct, age undetermined.   CV: TTE 10/30/2017: Study Conclusions  - Left ventricle: The cavity size was normal. Wall thickness was   normal. Systolic function was normal. The estimated ejection   fraction was in the range of 60% to 65%. Doppler parameters are   consistent with both elevated ventricular end-diastolic filling   pressure and elevated left atrial filling pressure. - Aortic valve: There was moderate stenosis. There was trivial   regurgitation. Mean gradient (S): 22 mm Hg. Peak gradient (S): 39   mm Hg. Valve area (VTI): 1.08 cm^2. Valve area (Vmax): 1.17 cm^2.   Valve area (Vmean): 1.14 cm^2. - Mitral valve: There was mild regurgitation. Valve area by   continuity equation (using LVOT flow): 2.75 cm^2. - Atrial septum: No defect or patent foramen ovale was identified.  Carotid US 03/21/2016: Impressions Abundant heterogeneous plaque, bilaterally. 1-39% bilateral ICA stenosis. Patent vertebral arteries with antegrade flow. Normal subclavian arteries, bilaterally. 1 year follow-up to assess plaque progression.  Lexiscan 09/03/2012: Overall Impression:  Normal stress nuclear study.  LV Ejection Fraction: 61%.  LV Wall Motion:  NL LV Function; NL Wall Motion  Past Medical History:  Diagnosis Date  . CAD of autologous bypass graft   . Carotid artery disease ([place])   . Diabetes mellitus without complication ([place])   . DJD (degenerative joint disease)   .  History of anemia of chronic disease   . Hypercholesteremia   . Hypertension   . PVD (peripheral vascular disease) with claudication ([place])   . Renal insufficiency     Past Surgical History:  Procedure Laterality Date  . cataract surgery    . COLONOSCOPY     Hx: of  . CORONARY  ARTERY BYPASS GRAFT  2000   by Dr. Cyndia Bent  . LUMBAR LAMINECTOMY/DECOMPRESSION MICRODISCECTOMY N/A 09/10/2012   Procedure: LUMBAR DECOMPRESSION,  IN SITU FUSION LUMBAR 4-5;  Surgeon: Melina Schools, MD;  Location: [facility] [place];  Service: Orthopedics;  Laterality: N/A;  . TONSILLECTOMY      MEDICATIONS: No current facility-administered medications for this encounter.    Marland Kitchen amLODipine (NORVASC) 10 MG tablet  . aspirin EC 81 MG tablet  . atorvastatin (LIPITOR) 20 MG tablet  . calcium acetate (PHOSLO) 667 MG capsule  . carvedilol (COREG) 12.5 MG tablet  . ferrous gluconate (FERGON) 325 MG tablet  . furosemide (LASIX) 40 MG tablet  . levothyroxine (SYNTHROID, LEVOTHROID) 50 MCG tablet  . Multiple Vitamin (MULTIVITAMIN WITH MINERALS) TABS tablet  . Multiple Vitamins-Minerals (PRESERVISION AREDS 2 PO)  . Vitamin D, Ergocalciferol, (DRISDOL) 1.25 MG (50000 UT) CAPS capsule  . cilostazol (PLETAL) 100 MG tablet  . glucose blood (ACCU-CHEK AVIVA PLUS) test strip  . Lancets (ACCU-CHEK MULTICLIX) lancets     Wynonia Musty [facility] Short Stay Center/Anesthesiology Phone [phone] 03/19/2018 12:45 PM

## 2018-03-19 NOTE — Progress Notes (Signed)
PA, Anesthesiology, asked to review pt history; see note.

## 2018-03-19 NOTE — Progress Notes (Signed)
Pt denies S(B and chest pain. Pt stated that he is under the care of Dr. Burt Knack, Cardiology. Pt made aware to stop taking vitamins, fish oil and herbal medications. Do not take any NSAIDs ie: Ibuprofen, Advil, Naproxen (Aleve), Motrin, BC and Goody Powder. Pt stated that his last dose of Pletal was " Friday night." Pt made aware to check BG every 2 hours prior to arrival to hospital on DOS. Pt made aware to treat a BG < 70 with 4 ounces of apple juice, wait 15 minutes after intervention to recheck BG, if BG remains < 70, call Short Stay unit to speak with a nurse. Pt verbalized understanding of all pre-op instructions.

## 2018-03-19 NOTE — Anesthesia Preprocedure Evaluation (Addendum)
Anesthesia Evaluation  Patient identified by MRN, date of birth, ID band Patient awake    Reviewed: Allergy & Precautions, NPO status , Patient's Chart, lab work & pertinent test results  History of Anesthesia Complications Negative for: history of anesthetic complications  Airway Mallampati: II  TM Distance: >3 FB Neck ROM: Full    Dental  (+) Dental Advisory Given, Partial Upper   Pulmonary former smoker,    Pulmonary exam normal breath sounds clear to auscultation       Cardiovascular hypertension, Pt. on medications and Pt. on home beta blockers + CAD, + CABG (2000) and + Peripheral Vascular Disease  Normal cardiovascular exam+ Valvular Problems/Murmurs AS  Rhythm:Regular Rate:Normal  TTE 9/19: EF 60-65%, moderate AS, mild MR   Neuro/Psych negative neurological ROS     GI/Hepatic negative GI ROS, Neg liver ROS,   Endo/Other  diabetes, Type 2Hypothyroidism   Renal/GU Renal InsufficiencyRenal disease     Musculoskeletal  (+) Arthritis ,   Abdominal   Peds  Hematology  (+) anemia ,   Anesthesia Other Findings Day of surgery medications reviewed with the patient.  Reproductive/Obstetrics                           Anesthesia Physical Anesthesia Plan  ASA: III  Anesthesia Plan: MAC   Post-op Pain Management:    Induction:   PONV Risk Score and Plan: Treatment may vary due to age or medical condition and Propofol infusion  Airway Management Planned: Natural Airway and Nasal Cannula  Additional Equipment:   Intra-op Plan:   Post-operative Plan:   Informed Consent: I have reviewed the patients History and Physical, chart, labs and discussed the procedure including the risks, benefits and alternatives for the proposed anesthesia with the patient or authorized representative who has indicated his/her understanding and acceptance.     Dental advisory given  Plan Discussed with:  CRNA  Anesthesia Plan Comments: (See PAT note by Karoline Caldwell, PA-C )      Anesthesia Quick Evaluation

## 2018-03-20 ENCOUNTER — Ambulatory Visit (HOSPITAL_COMMUNITY): Payer: Medicare Other | Admitting: Physician Assistant

## 2018-03-20 ENCOUNTER — Telehealth: Payer: Self-pay | Admitting: Cardiovascular Disease

## 2018-03-20 ENCOUNTER — Encounter (HOSPITAL_COMMUNITY)
Admission: RE | Disposition: A | Discharge status: Home / Self Care | Payer: Self-pay | Source: Home / Self Care | Attending: Surgery

## 2018-03-20 ENCOUNTER — Ambulatory Visit (HOSPITAL_COMMUNITY)
Admission: RE | Admit: 2018-03-20 | Discharge: 2018-03-20 | Disposition: A | Discharge status: Home / Self Care | Payer: Medicare Other | Attending: Surgery | Admitting: Surgery

## 2018-03-20 ENCOUNTER — Encounter (HOSPITAL_COMMUNITY): Payer: Self-pay | Admitting: Certified Registered Nurse Anesthetist

## 2018-03-20 DIAGNOSIS — Z7982 Long term (current) use of aspirin: Secondary | ICD-10-CM | POA: Insufficient documentation

## 2018-03-20 DIAGNOSIS — I120 Hypertensive chronic kidney disease with stage 5 chronic kidney disease or end stage renal disease: Secondary | ICD-10-CM | POA: Insufficient documentation

## 2018-03-20 DIAGNOSIS — N185 Chronic kidney disease, stage 5: Secondary | ICD-10-CM | POA: Diagnosis not present

## 2018-03-20 DIAGNOSIS — Z79890 Hormone replacement therapy (postmenopausal): Secondary | ICD-10-CM | POA: Diagnosis not present

## 2018-03-20 DIAGNOSIS — Z951 Presence of aortocoronary bypass graft: Secondary | ICD-10-CM | POA: Diagnosis not present

## 2018-03-20 DIAGNOSIS — D631 Anemia in chronic kidney disease: Secondary | ICD-10-CM | POA: Insufficient documentation

## 2018-03-20 DIAGNOSIS — E1151 Type 2 diabetes mellitus with diabetic peripheral angiopathy without gangrene: Secondary | ICD-10-CM | POA: Insufficient documentation

## 2018-03-20 DIAGNOSIS — Z79899 Other long term (current) drug therapy: Secondary | ICD-10-CM | POA: Diagnosis not present

## 2018-03-20 DIAGNOSIS — I129 Hypertensive chronic kidney disease with stage 1 through stage 4 chronic kidney disease, or unspecified chronic kidney disease: Secondary | ICD-10-CM | POA: Diagnosis not present

## 2018-03-20 DIAGNOSIS — Z87891 Personal history of nicotine dependence: Secondary | ICD-10-CM | POA: Diagnosis not present

## 2018-03-20 DIAGNOSIS — E1122 Type 2 diabetes mellitus with diabetic chronic kidney disease: Secondary | ICD-10-CM | POA: Insufficient documentation

## 2018-03-20 DIAGNOSIS — E7800 Pure hypercholesterolemia, unspecified: Secondary | ICD-10-CM | POA: Diagnosis not present

## 2018-03-20 DIAGNOSIS — I2510 Atherosclerotic heart disease of native coronary artery without angina pectoris: Secondary | ICD-10-CM | POA: Insufficient documentation

## 2018-03-20 DIAGNOSIS — I25810 Atherosclerosis of coronary artery bypass graft(s) without angina pectoris: Secondary | ICD-10-CM | POA: Insufficient documentation

## 2018-03-20 DIAGNOSIS — N189 Chronic kidney disease, unspecified: Secondary | ICD-10-CM | POA: Diagnosis not present

## 2018-03-20 DIAGNOSIS — I440 Atrioventricular block, first degree: Secondary | ICD-10-CM

## 2018-03-20 DIAGNOSIS — I441 Atrioventricular block, second degree: Secondary | ICD-10-CM

## 2018-03-20 HISTORY — DX: Presence of dental prosthetic device (complete) (partial): Z97.2

## 2018-03-20 HISTORY — PX: AV FISTULA PLACEMENT: SHX1204

## 2018-03-20 HISTORY — DX: Anemia, unspecified: D64.9

## 2018-03-20 HISTORY — DX: Presence of spectacles and contact lenses: Z97.3

## 2018-03-20 LAB — BASIC METABOLIC PANEL
Anion gap: 8 (ref 5–15)
BUN: 57 mg/dL — ABNORMAL HIGH (ref 8–23)
CO2: 23 mmol/L (ref 22–32)
Calcium: 8.8 mg/dL — ABNORMAL LOW (ref 8.9–10.3)
Chloride: 104 mmol/L (ref 98–111)
Creatinine, Ser: 2.44 mg/dL — ABNORMAL HIGH (ref 0.61–1.24)
GFR calc Af Amer: 26 mL/min — ABNORMAL LOW (ref >60)
GFR calc non Af Amer: 22 mL/min — ABNORMAL LOW (ref >60)
GLUCOSE: 108 mg/dL — AB (ref 70–99)
Potassium: 4 mmol/L (ref 3.5–5.1)
Sodium: 135 mmol/L (ref 135–145)

## 2018-03-20 LAB — GLUCOSE, CAPILLARY
GLUCOSE-CAPILLARY: 95 mg/dL (ref 70–99)
Glucose-Capillary: 99 mg/dL (ref 70–99)

## 2018-03-20 SURGERY — ARTERIOVENOUS (AV) FISTULA CREATION
Anesthesia: Monitor Anesthesia Care | Site: Arm Upper | Laterality: Left

## 2018-03-20 MED ORDER — LIDOCAINE-EPINEPHRINE (PF) 1 %-1:200000 IJ SOLN
INTRAMUSCULAR | Status: AC
  Filled 2018-03-20: qty 30

## 2018-03-20 MED ORDER — ONDANSETRON HCL 4 MG/2ML IJ SOLN
INTRAMUSCULAR | Status: DC | PRN
  Administered 2018-03-20: 4 mg via INTRAVENOUS

## 2018-03-20 MED ORDER — SODIUM CHLORIDE 0.9 % IV SOLN
INTRAVENOUS | Status: AC
  Filled 2018-03-20: qty 1.2

## 2018-03-20 MED ORDER — CEFAZOLIN SODIUM-DEXTROSE 2-3 GM-%(50ML) IV SOLR
INTRAVENOUS | Status: DC | PRN
  Administered 2018-03-20: 2 g via INTRAVENOUS

## 2018-03-20 MED ORDER — FENTANYL CITRATE (PF) 100 MCG/2ML IJ SOLN: 25.0000 ug | INTRAMUSCULAR | Status: DC | PRN

## 2018-03-20 MED ORDER — FENTANYL CITRATE (PF) 250 MCG/5ML IJ SOLN
INTRAMUSCULAR | Status: AC
  Filled 2018-03-20: qty 5

## 2018-03-20 MED ORDER — SODIUM CHLORIDE 0.9 % IV SOLN
INTRAVENOUS | Status: DC | PRN
  Administered 2018-03-20: 500 mL

## 2018-03-20 MED ORDER — TRAMADOL HCL 50 MG PO TABS: 50.0000 mg | ORAL_TABLET | Freq: Four times a day (QID) | ORAL | 0 refills | Status: DC | PRN

## 2018-03-20 MED ORDER — LIDOCAINE 2% (20 MG/ML) 5 ML SYRINGE
INTRAMUSCULAR | Status: DC | PRN
  Administered 2018-03-20: 60 mg via INTRAVENOUS

## 2018-03-20 MED ORDER — PROMETHAZINE HCL 25 MG/ML IJ SOLN: 6.2500 mg | INTRAMUSCULAR | Status: DC | PRN

## 2018-03-20 MED ORDER — LIDOCAINE-EPINEPHRINE (PF) 1 %-1:200000 IJ SOLN
INTRAMUSCULAR | Status: DC | PRN
  Administered 2018-03-20: 5 mL

## 2018-03-20 MED ORDER — 0.9 % SODIUM CHLORIDE (POUR BTL) OPTIME
TOPICAL | Status: DC | PRN
  Administered 2018-03-20: 1000 mL

## 2018-03-20 MED ORDER — PHENYLEPHRINE 40 MCG/ML (10ML) SYRINGE FOR IV PUSH (FOR BLOOD PRESSURE SUPPORT)
PREFILLED_SYRINGE | INTRAVENOUS | Status: DC | PRN
  Administered 2018-03-20 (×5): 80 ug via INTRAVENOUS

## 2018-03-20 MED ORDER — EPHEDRINE SULFATE-NACL 50-0.9 MG/10ML-% IV SOSY
PREFILLED_SYRINGE | INTRAVENOUS | Status: DC | PRN
  Administered 2018-03-20 (×2): 5 mg via INTRAVENOUS

## 2018-03-20 MED ORDER — PHENYLEPHRINE 40 MCG/ML (10ML) SYRINGE FOR IV PUSH (FOR BLOOD PRESSURE SUPPORT)
PREFILLED_SYRINGE | INTRAVENOUS | Status: AC
  Filled 2018-03-20: qty 10

## 2018-03-20 MED ORDER — PROPOFOL 10 MG/ML IV BOLUS
INTRAVENOUS | Status: AC
  Filled 2018-03-20: qty 20

## 2018-03-20 MED ORDER — SODIUM CHLORIDE 0.9 % IV SOLN
INTRAVENOUS | Status: DC | PRN
  Administered 2018-03-20: 08:00:00 via INTRAVENOUS

## 2018-03-20 MED ORDER — PROPOFOL 10 MG/ML IV BOLUS
INTRAVENOUS | Status: DC | PRN
  Administered 2018-03-20: 20 mg via INTRAVENOUS

## 2018-03-20 MED ORDER — PROPOFOL 500 MG/50ML IV EMUL
INTRAVENOUS | Status: DC | PRN
  Administered 2018-03-20: 75 ug/kg/min via INTRAVENOUS

## 2018-03-20 MED ORDER — EPHEDRINE 5 MG/ML INJ
INTRAVENOUS | Status: AC
  Filled 2018-03-20: qty 10

## 2018-03-20 MED ORDER — ACETAMINOPHEN 10 MG/ML IV SOLN: 1000.0000 mg | Freq: Once | INTRAVENOUS | Status: DC | PRN

## 2018-03-20 MED ORDER — ONDANSETRON HCL 4 MG/2ML IJ SOLN
INTRAMUSCULAR | Status: AC
  Filled 2018-03-20: qty 2

## 2018-03-20 MED ORDER — PROPOFOL 1000 MG/100ML IV EMUL
INTRAVENOUS | Status: AC
  Filled 2018-03-20: qty 200

## 2018-03-20 MED ORDER — HEPARIN SODIUM (PORCINE) 1000 UNIT/ML IJ SOLN
INTRAMUSCULAR | Status: AC
  Filled 2018-03-20: qty 1

## 2018-03-20 SURGICAL SUPPLY — 34 items
ADH SKN CLS APL DERMABOND .7 (GAUZE/BANDAGES/DRESSINGS) ×1
ARMBAND PINK RESTRICT EXTREMIT (MISCELLANEOUS) ×4 IMPLANT
CANISTER SUCT 3000ML PPV (MISCELLANEOUS) ×3 IMPLANT
CLIP VESOCCLUDE MED 6/CT (CLIP) ×3 IMPLANT
CLIP VESOCCLUDE SM WIDE 6/CT (CLIP) ×5 IMPLANT
COVER PROBE W GEL 5X96 (DRAPES) ×3 IMPLANT
COVER WAND RF STERILE (DRAPES) ×1 IMPLANT
DERMABOND ADVANCED (GAUZE/BANDAGES/DRESSINGS) ×2
DERMABOND ADVANCED .7 DNX12 (GAUZE/BANDAGES/DRESSINGS) ×1 IMPLANT
ELECT REM PT RETURN 9FT ADLT (ELECTROSURGICAL) ×3
ELECTRODE REM PT RTRN 9FT ADLT (ELECTROSURGICAL) ×1 IMPLANT
GLOVE BIOGEL PI IND STRL 6.5 (GLOVE) IMPLANT
GLOVE BIOGEL PI IND STRL 7.5 (GLOVE) ×1 IMPLANT
GLOVE BIOGEL PI INDICATOR 6.5 (GLOVE) ×6
GLOVE BIOGEL PI INDICATOR 7.5 (GLOVE) ×2
GLOVE INDICATOR 7.0 STRL GRN (GLOVE) ×2 IMPLANT
GLOVE SURG SS PI 7.5 STRL IVOR (GLOVE) ×3 IMPLANT
GOWN STRL REUS W/ TWL LRG LVL3 (GOWN DISPOSABLE) ×2 IMPLANT
GOWN STRL REUS W/ TWL XL LVL3 (GOWN DISPOSABLE) ×1 IMPLANT
GOWN STRL REUS W/TWL LRG LVL3 (GOWN DISPOSABLE) ×9
GOWN STRL REUS W/TWL XL LVL3 (GOWN DISPOSABLE) ×3
HEMOSTAT SNOW SURGICEL 2X4 (HEMOSTASIS) IMPLANT
KIT BASIN OR (CUSTOM PROCEDURE TRAY) ×3 IMPLANT
KIT TURNOVER KIT B (KITS) ×3 IMPLANT
NS IRRIG 1000ML POUR BTL (IV SOLUTION) ×3 IMPLANT
PACK CV ACCESS (CUSTOM PROCEDURE TRAY) ×3 IMPLANT
PAD ARMBOARD 7.5X6 YLW CONV (MISCELLANEOUS) ×6 IMPLANT
SUT PROLENE 6 0 CC (SUTURE) ×7 IMPLANT
SUT VIC AB 3-0 SH 27 (SUTURE) ×3
SUT VIC AB 3-0 SH 27X BRD (SUTURE) ×1 IMPLANT
SUT VICRYL 4-0 PS2 18IN ABS (SUTURE) ×3 IMPLANT
TOWEL GREEN STERILE (TOWEL DISPOSABLE) ×3 IMPLANT
UNDERPAD 30X30 (UNDERPADS AND DIAPERS) ×3 IMPLANT
WATER STERILE IRR 1000ML POUR (IV SOLUTION) ×3 IMPLANT

## 2018-03-20 NOTE — Op Note (Signed)
    Patient name: Henry [name] MRN: [id number] DOB: 1924/06/28 Sex: male  03/20/2018 Pre-operative Diagnosis: CKD Post-operative diagnosis:  Same Surgeon:  Annamarie Major Assistants:  Elenor Legato, RNFA Procedure:   Left brachiocephalic fistula Anesthesia: MAC Blood Loss: Minimal Specimens: None  Findings: Healthy 3.5 mm artery vein was 3 mm  Indications: The patient comes in today for dialysis access.  Procedure:  The patient was identified in the holding area and taken to [place] 11  The patient was then placed supine on the table. MAC anesthesia was administered.  The patient was prepped and draped in the usual sterile fashion.  A time out was called and antibiotics were administered.  Ultrasound was used to map the course of the cephalic vein in the upper arm.  The cephalic vein appeared to be an adequate conduit.  1% lidocaine was used for local anesthesia.  A transverse incision was made just proximal to the antecubital crease.  I first dissected out the brachial artery.  This was a 3.5 mm disease-free artery.  I then isolated the cephalic vein.  Side branches were ligated between silk ties.  The vein had spasm initially.  I marked it for orientation and then ligated it distally.  It was distended with heparin saline.  It distended to a 3-3.5 mm vein.  Next the brachial artery was occluded with vascular clamps.  A #11 blade was used to make an arteriotomy which was extended longitudinally with Potts scissors.  The vein was cut to the appropriate length and spatulated to fit the size of the arteriotomy.  A running anastomosis was created with 6-0 Prolene.  Prior to completion, the appropriate flushing maneuvers were performed and the anastomosis was completed.  I inspected the course of the vein to make sure there were no kinks.  There was a palpable thrill within the fistula.  The patient had a palpable radial pulse.  The wound was irrigated.  Hemostasis was achieved.  The incision was  closed with 2 layers of 3-0 Vicryl followed by Dermabond.  There were no immediate complications.   Disposition: To PACU stable.   Theotis Burrow, M.D. [facility] [place] Office: [phone] Pager:  [phone]

## 2018-03-20 NOTE — Discharge Instructions (Signed)

## 2018-03-20 NOTE — Telephone Encounter (Signed)
ew Message:     Pt says he was just at the hospital and had a procedure, he says he needs to talk to you please. He said he was instructed to call you.

## 2018-03-20 NOTE — Progress Notes (Addendum)
Dr. Trula Slade also paged to make him aware of Potassium result and that we have redrew labs   6:55 AM Dr. Trula Slade aware

## 2018-03-20 NOTE — Progress Notes (Addendum)
CRITICAL VALUE ALERT  Critical Value:  K 8.5 istat 4  Date & Time Notied:  03/20/18 0615  Provider Notified: Dr. Linna Caprice [phone]  Orders Received/Actions taken: draw a BMP send to main lab, patient placed on heart monitor, patient is not currently on dialysis

## 2018-03-20 NOTE — Telephone Encounter (Signed)
Henry [name] reports he had his AV fistula placed today and that went well, but he was told to call Dr. Burt Knack and notify him he had a rhythm change on his EKG. Informed him Dr. Burt Knack would be notified and he will be called back with recommendations.  Per Dr. Gladys Damme, RN's note today, "Dr. Daiva Huge contacted via telephone the Cardiologist on call to notify them of pts change in rhythm. Pt was in First degree AV block on arrival to hospital, which was baseline, and then changed into second degree AV block type one after surgery (confirmed with EKG). Dr. Daiva Huge discussed a consult from cardiology over the phone and the on call cardiology MD said that a bedside consult was not needed and patient was okay to send home. RN discussed this with Dr. Daiva Huge at bedside with patient. It was decided to update Dr. York Cerise (pts current cardiologist) office after discharge to make him aware of changes and then Dr. Burt Knack can decide if further action is needed. Dr. Trula Slade made aware of all of this information and agreed with following up with Dr. Copper. Discharge instructions were done with daughter and patient. Both satisfactory with plan."

## 2018-03-20 NOTE — Anesthesia Procedure Notes (Signed)
Procedure Name: MAC Date/Time: 03/20/2018 7:58 AM Performed by: Candis Shine, CRNA Pre-anesthesia Checklist: Patient identified, Emergency Drugs available, Suction available, Patient being monitored and Timeout performed Patient Re-evaluated:Patient Re-evaluated prior to induction Oxygen Delivery Method: Simple face mask Dental Injury: Teeth and Oropharynx as per pre-operative assessment

## 2018-03-20 NOTE — Transfer of Care (Signed)
Immediate Anesthesia Transfer of Care Note  Patient: Henry [name]  Procedure(s) Performed: ARTERIOVENOUS (AV) FISTULA CREATION LEFT ARM (Left Arm Upper)  Patient Location: PACU  Anesthesia Type:MAC  Level of Consciousness: awake, alert  and oriented  Airway & Oxygen Therapy: Patient Spontanous Breathing and Patient connected to face mask  Post-op Assessment: Report given to RN and Post -op Vital signs reviewed and stable  Post vital signs: Reviewed and stable  Last Vitals:  Vitals Value Taken Time  BP 120/55 03/20/2018  9:22 AM  Temp    Pulse 63 03/20/2018  9:23 AM  Resp 16 03/20/2018  9:23 AM  SpO2 100 % 03/20/2018  9:23 AM  Vitals shown include unvalidated device data.  Last Pain:  Vitals:   03/20/18 0701  TempSrc:   PainSc: 0-No pain      Patients Stated Pain Goal: 4 (76/81/15 7262)  Complications: No apparent anesthesia complications

## 2018-03-20 NOTE — Progress Notes (Signed)
Dr. Daiva Huge contacted via telephone the Cardiologist on call to notify them of pts change in rhythm. Pt was in First degree AV block on arrival to hospital, which was baseline, and then changed into second degree AV block type one after surgery (confirmed with EKG). Dr. Daiva Huge discussed a consult from cardiology over the phone and the on call cardiology MD said that a bedside consult was not needed and patient was okay to send home. RN discussed this with Dr. Daiva Huge at bedside with patient. It was decided to update Dr. York Cerise (pts current cardiologist) office after discharge to make him aware of changes and then Dr. Burt Knack can decide if further action is needed. Dr. Trula Slade made aware of all of this information and agreed with following up with Dr. Copper. Discharge instructions were done with daughter and patient. Both satisfactory with plan.

## 2018-03-20 NOTE — Anesthesia Postprocedure Evaluation (Signed)
Anesthesia Post Note  Patient: Porfirio Bollier Weinmann  Procedure(s) Performed: ARTERIOVENOUS (AV) FISTULA CREATION LEFT ARM (Left Arm Upper)     Patient location during evaluation: PACU Anesthesia Type: MAC Level of consciousness: awake and alert Pain management: pain level controlled Vital Signs Assessment: post-procedure vital signs reviewed and stable Respiratory status: spontaneous breathing, nonlabored ventilation and respiratory function stable Cardiovascular status: blood pressure returned to baseline, stable and bradycardic Postop Assessment: no apparent nausea or vomiting Anesthetic complications: no Comments: New 2nd degree type 1 block, HR in low 40s-50s in PACU. Pt asymptomatic. BP stable 120s/50s. Spoke to on-call cardiologist for curbside, recommended no further workup. Spoke with pt and his daughter who prefer to see pt's home cardiologist as outpatient.    Last Vitals:  Vitals:   03/20/18 1022 03/20/18 1037  BP: (!) 129/52 (!) 129/51  Pulse: (!) 50 (!) 44  Resp: 18 16  Temp:    SpO2: 99% 100%    Last Pain:  Vitals:   03/20/18 1022  TempSrc:   PainSc: 0-No pain                 Brennan Bailey

## 2018-03-20 NOTE — Interval H&P Note (Signed)
History and Physical Interval Note:  03/20/2018 7:29 AM  Henry [name]  has presented today for surgery, with the diagnosis of CHRONIC KIDNEY DISEASE FOR HEMODIALYSIS ACCESS  The various methods of treatment have been discussed with the patient and family. After consideration of risks, benefits and other options for treatment, the patient has consented to  Procedure(s): ARTERIOVENOUS (AV) FISTULA CREATION LEFT ARM (Left) as a surgical intervention .  The patient's history has been reviewed, patient examined, no change in status, stable for surgery.  I have reviewed the patient's chart and labs.  Questions were answered to the patient's satisfaction.     Annamarie Major

## 2018-03-21 ENCOUNTER — Telehealth: Payer: Self-pay | Admitting: Surgery

## 2018-03-21 ENCOUNTER — Encounter (HOSPITAL_COMMUNITY): Payer: Self-pay | Admitting: Surgery

## 2018-03-21 DIAGNOSIS — I2510 Atherosclerotic heart disease of native coronary artery without angina pectoris: Secondary | ICD-10-CM | POA: Diagnosis not present

## 2018-03-21 DIAGNOSIS — N185 Chronic kidney disease, stage 5: Secondary | ICD-10-CM | POA: Diagnosis not present

## 2018-03-21 DIAGNOSIS — E1151 Type 2 diabetes mellitus with diabetic peripheral angiopathy without gangrene: Secondary | ICD-10-CM | POA: Diagnosis not present

## 2018-03-21 DIAGNOSIS — I120 Hypertensive chronic kidney disease with stage 5 chronic kidney disease or end stage renal disease: Secondary | ICD-10-CM | POA: Diagnosis not present

## 2018-03-21 DIAGNOSIS — E1122 Type 2 diabetes mellitus with diabetic chronic kidney disease: Secondary | ICD-10-CM | POA: Diagnosis not present

## 2018-03-21 DIAGNOSIS — Z48812 Encounter for surgical aftercare following surgery on the circulatory system: Secondary | ICD-10-CM | POA: Diagnosis not present

## 2018-03-21 DIAGNOSIS — J8410 Pulmonary fibrosis, unspecified: Secondary | ICD-10-CM | POA: Diagnosis not present

## 2018-03-21 DIAGNOSIS — I350 Nonrheumatic aortic (valve) stenosis: Secondary | ICD-10-CM | POA: Diagnosis not present

## 2018-03-21 NOTE — Telephone Encounter (Signed)
sch appt spk to pt mld ltr 04/28/2018 11am Dialysis duplex 12 pm p/o MD

## 2018-03-21 NOTE — Telephone Encounter (Signed)
sch appt spk to pt mld ltr 04/28/2018 11am Dialysis Duplex 12pm p/o MD

## 2018-03-21 NOTE — Telephone Encounter (Signed)
-----   Message from Penni Homans, RN sent at 03/21/2018 10:06 AM EST ----- Regarding: appointment  ----- Message ----- From: Serafina Mitchell, MD Sent: 03/20/2018   9:50 PM EST To: [facility] Charge Pool  03/20/2018:  Surgeon:  Annamarie Major Assistants:  Elenor Legato, RNFA Procedure:   Left brachiocephalic fistula  Follow-up in 6 weeks with duplex of fistula

## 2018-03-23 NOTE — Telephone Encounter (Signed)
Pt with second degree mobitz 1 block. Sounds like he is asymptomatic but his rate is slow in the 40's. I would recommend holding carvedilol and arranging a 24 hour holter monitor. thanks

## 2018-03-24 DIAGNOSIS — I120 Hypertensive chronic kidney disease with stage 5 chronic kidney disease or end stage renal disease: Secondary | ICD-10-CM | POA: Diagnosis not present

## 2018-03-24 DIAGNOSIS — E1122 Type 2 diabetes mellitus with diabetic chronic kidney disease: Secondary | ICD-10-CM | POA: Diagnosis not present

## 2018-03-24 DIAGNOSIS — I2510 Atherosclerotic heart disease of native coronary artery without angina pectoris: Secondary | ICD-10-CM | POA: Diagnosis not present

## 2018-03-24 DIAGNOSIS — N185 Chronic kidney disease, stage 5: Secondary | ICD-10-CM | POA: Diagnosis not present

## 2018-03-24 DIAGNOSIS — Z48812 Encounter for surgical aftercare following surgery on the circulatory system: Secondary | ICD-10-CM | POA: Diagnosis not present

## 2018-03-24 DIAGNOSIS — J8410 Pulmonary fibrosis, unspecified: Secondary | ICD-10-CM | POA: Diagnosis not present

## 2018-03-24 LAB — POCT I-STAT 4, (NA,K, GLUC, HGB,HCT)
GLUCOSE: 103 mg/dL — AB (ref 70–99)
HEMATOCRIT: 23 % — AB (ref 39.0–52.0)
Hemoglobin: 7.8 g/dL — ABNORMAL LOW (ref 13.0–17.0)
Potassium: >8.5 mmol/L (ref 3.5–5.1)
Sodium: 129 mmol/L — ABNORMAL LOW (ref 135–145)

## 2018-03-24 NOTE — Telephone Encounter (Signed)
Instructed patient to [place].  24 hour holter monitor ordered for scheduling. Mr. Dralle agrees with treatment plan.

## 2018-03-25 ENCOUNTER — Encounter (HOSPITAL_COMMUNITY): Payer: Self-pay | Admitting: *Deleted

## 2018-03-25 ENCOUNTER — Other Ambulatory Visit: Payer: Self-pay

## 2018-03-25 ENCOUNTER — Inpatient Hospital Stay (HOSPITAL_COMMUNITY): Payer: Medicare Other

## 2018-03-25 ENCOUNTER — Emergency Department (HOSPITAL_COMMUNITY): Payer: Medicare Other

## 2018-03-25 ENCOUNTER — Inpatient Hospital Stay (HOSPITAL_COMMUNITY)
Admission: EM | Admit: 2018-03-25 | Discharge: 2018-04-02 | DRG: 280 | Disposition: A | Discharge status: Home Health | Payer: Medicare Other | Attending: Family Medicine | Admitting: Family Medicine

## 2018-03-25 ENCOUNTER — Emergency Department (HOSPITAL_BASED_OUTPATIENT_CLINIC_OR_DEPARTMENT_OTHER): Payer: Medicare Other

## 2018-03-25 ENCOUNTER — Telehealth: Payer: Self-pay | Admitting: Cardiovascular Disease

## 2018-03-25 DIAGNOSIS — J9690 Respiratory failure, unspecified, unspecified whether with hypoxia or hypercapnia: Secondary | ICD-10-CM | POA: Diagnosis not present

## 2018-03-25 DIAGNOSIS — I440 Atrioventricular block, first degree: Secondary | ICD-10-CM | POA: Diagnosis not present

## 2018-03-25 DIAGNOSIS — Z09 Encounter for follow-up examination after completed treatment for conditions other than malignant neoplasm: Secondary | ICD-10-CM

## 2018-03-25 DIAGNOSIS — I2584 Coronary atherosclerosis due to calcified coronary lesion: Secondary | ICD-10-CM | POA: Diagnosis present

## 2018-03-25 DIAGNOSIS — I472 Ventricular tachycardia: Secondary | ICD-10-CM | POA: Diagnosis present

## 2018-03-25 DIAGNOSIS — I2510 Atherosclerotic heart disease of native coronary artery without angina pectoris: Secondary | ICD-10-CM | POA: Diagnosis not present

## 2018-03-25 DIAGNOSIS — J810 Acute pulmonary edema: Secondary | ICD-10-CM | POA: Diagnosis not present

## 2018-03-25 DIAGNOSIS — E039 Hypothyroidism, unspecified: Secondary | ICD-10-CM | POA: Diagnosis present

## 2018-03-25 DIAGNOSIS — E8770 Fluid overload, unspecified: Secondary | ICD-10-CM

## 2018-03-25 DIAGNOSIS — R069 Unspecified abnormalities of breathing: Secondary | ICD-10-CM | POA: Diagnosis not present

## 2018-03-25 DIAGNOSIS — R0602 Shortness of breath: Secondary | ICD-10-CM

## 2018-03-25 DIAGNOSIS — E119 Type 2 diabetes mellitus without complications: Secondary | ICD-10-CM

## 2018-03-25 DIAGNOSIS — J9691 Respiratory failure, unspecified with hypoxia: Secondary | ICD-10-CM | POA: Diagnosis present

## 2018-03-25 DIAGNOSIS — Z87891 Personal history of nicotine dependence: Secondary | ICD-10-CM

## 2018-03-25 DIAGNOSIS — D631 Anemia in chronic kidney disease: Secondary | ICD-10-CM | POA: Diagnosis present

## 2018-03-25 DIAGNOSIS — E1151 Type 2 diabetes mellitus with diabetic peripheral angiopathy without gangrene: Secondary | ICD-10-CM | POA: Diagnosis present

## 2018-03-25 DIAGNOSIS — I493 Ventricular premature depolarization: Secondary | ICD-10-CM | POA: Diagnosis present

## 2018-03-25 DIAGNOSIS — Z7982 Long term (current) use of aspirin: Secondary | ICD-10-CM | POA: Diagnosis not present

## 2018-03-25 DIAGNOSIS — N179 Acute kidney failure, unspecified: Secondary | ICD-10-CM

## 2018-03-25 DIAGNOSIS — I10 Essential (primary) hypertension: Secondary | ICD-10-CM | POA: Diagnosis present

## 2018-03-25 DIAGNOSIS — M1990 Unspecified osteoarthritis, unspecified site: Secondary | ICD-10-CM | POA: Diagnosis present

## 2018-03-25 DIAGNOSIS — I080 Rheumatic disorders of both mitral and aortic valves: Secondary | ICD-10-CM | POA: Diagnosis present

## 2018-03-25 DIAGNOSIS — R0902 Hypoxemia: Secondary | ICD-10-CM | POA: Diagnosis not present

## 2018-03-25 DIAGNOSIS — E785 Hyperlipidemia, unspecified: Secondary | ICD-10-CM | POA: Diagnosis present

## 2018-03-25 DIAGNOSIS — E860 Dehydration: Secondary | ICD-10-CM | POA: Diagnosis not present

## 2018-03-25 DIAGNOSIS — D638 Anemia in other chronic diseases classified elsewhere: Secondary | ICD-10-CM | POA: Diagnosis not present

## 2018-03-25 DIAGNOSIS — J9600 Acute respiratory failure, unspecified whether with hypoxia or hypercapnia: Secondary | ICD-10-CM | POA: Diagnosis present

## 2018-03-25 DIAGNOSIS — E7800 Pure hypercholesterolemia, unspecified: Secondary | ICD-10-CM | POA: Diagnosis present

## 2018-03-25 DIAGNOSIS — I132 Hypertensive heart and chronic kidney disease with heart failure and with stage 5 chronic kidney disease, or end stage renal disease: Secondary | ICD-10-CM | POA: Diagnosis present

## 2018-03-25 DIAGNOSIS — E876 Hypokalemia: Secondary | ICD-10-CM | POA: Diagnosis not present

## 2018-03-25 DIAGNOSIS — Z79891 Long term (current) use of opiate analgesic: Secondary | ICD-10-CM | POA: Diagnosis not present

## 2018-03-25 DIAGNOSIS — I779 Disorder of arteries and arterioles, unspecified: Secondary | ICD-10-CM | POA: Diagnosis present

## 2018-03-25 DIAGNOSIS — E1122 Type 2 diabetes mellitus with diabetic chronic kidney disease: Secondary | ICD-10-CM | POA: Diagnosis present

## 2018-03-25 DIAGNOSIS — Z951 Presence of aortocoronary bypass graft: Secondary | ICD-10-CM

## 2018-03-25 DIAGNOSIS — Z79890 Hormone replacement therapy (postmenopausal): Secondary | ICD-10-CM

## 2018-03-25 DIAGNOSIS — I25119 Atherosclerotic heart disease of native coronary artery with unspecified angina pectoris: Secondary | ICD-10-CM | POA: Diagnosis not present

## 2018-03-25 DIAGNOSIS — Z8249 Family history of ischemic heart disease and other diseases of the circulatory system: Secondary | ICD-10-CM

## 2018-03-25 DIAGNOSIS — I441 Atrioventricular block, second degree: Secondary | ICD-10-CM | POA: Diagnosis present

## 2018-03-25 DIAGNOSIS — I951 Orthostatic hypotension: Secondary | ICD-10-CM | POA: Diagnosis present

## 2018-03-25 DIAGNOSIS — I214 Non-ST elevation (NSTEMI) myocardial infarction: Secondary | ICD-10-CM | POA: Diagnosis present

## 2018-03-25 DIAGNOSIS — Z79899 Other long term (current) drug therapy: Secondary | ICD-10-CM | POA: Diagnosis not present

## 2018-03-25 DIAGNOSIS — N185 Chronic kidney disease, stage 5: Secondary | ICD-10-CM | POA: Diagnosis present

## 2018-03-25 DIAGNOSIS — I739 Peripheral vascular disease, unspecified: Secondary | ICD-10-CM

## 2018-03-25 DIAGNOSIS — R609 Edema, unspecified: Secondary | ICD-10-CM | POA: Diagnosis not present

## 2018-03-25 DIAGNOSIS — J811 Chronic pulmonary edema: Secondary | ICD-10-CM | POA: Diagnosis present

## 2018-03-25 DIAGNOSIS — Z981 Arthrodesis status: Secondary | ICD-10-CM

## 2018-03-25 DIAGNOSIS — I5032 Chronic diastolic (congestive) heart failure: Secondary | ICD-10-CM

## 2018-03-25 DIAGNOSIS — I499 Cardiac arrhythmia, unspecified: Secondary | ICD-10-CM | POA: Diagnosis not present

## 2018-03-25 DIAGNOSIS — J9601 Acute respiratory failure with hypoxia: Secondary | ICD-10-CM

## 2018-03-25 DIAGNOSIS — I340 Nonrheumatic mitral (valve) insufficiency: Secondary | ICD-10-CM | POA: Diagnosis not present

## 2018-03-25 DIAGNOSIS — I25810 Atherosclerosis of coronary artery bypass graft(s) without angina pectoris: Secondary | ICD-10-CM | POA: Diagnosis present

## 2018-03-25 DIAGNOSIS — I5043 Acute on chronic combined systolic (congestive) and diastolic (congestive) heart failure: Secondary | ICD-10-CM | POA: Diagnosis present

## 2018-03-25 DIAGNOSIS — R7989 Other specified abnormal findings of blood chemistry: Secondary | ICD-10-CM

## 2018-03-25 DIAGNOSIS — R778 Other specified abnormalities of plasma proteins: Secondary | ICD-10-CM | POA: Diagnosis present

## 2018-03-25 DIAGNOSIS — M7989 Other specified soft tissue disorders: Secondary | ICD-10-CM | POA: Diagnosis not present

## 2018-03-25 DIAGNOSIS — N184 Chronic kidney disease, stage 4 (severe): Secondary | ICD-10-CM | POA: Diagnosis not present

## 2018-03-25 DIAGNOSIS — I5031 Acute diastolic (congestive) heart failure: Secondary | ICD-10-CM | POA: Diagnosis not present

## 2018-03-25 DIAGNOSIS — J90 Pleural effusion, not elsewhere classified: Secondary | ICD-10-CM | POA: Diagnosis not present

## 2018-03-25 DIAGNOSIS — I959 Hypotension, unspecified: Secondary | ICD-10-CM | POA: Diagnosis not present

## 2018-03-25 DIAGNOSIS — I120 Hypertensive chronic kidney disease with stage 5 chronic kidney disease or end stage renal disease: Secondary | ICD-10-CM | POA: Diagnosis not present

## 2018-03-25 DIAGNOSIS — I252 Old myocardial infarction: Secondary | ICD-10-CM

## 2018-03-25 LAB — COMPREHENSIVE METABOLIC PANEL
ALT: 15 U/L (ref 0–44)
AST: 26 U/L (ref 15–41)
Albumin: 3.6 g/dL (ref 3.5–5.0)
Alkaline Phosphatase: 78 U/L (ref 38–126)
Anion gap: 11 (ref 5–15)
BUN: 64 mg/dL — ABNORMAL HIGH (ref 8–23)
CHLORIDE: 106 mmol/L (ref 98–111)
CO2: 19 mmol/L — ABNORMAL LOW (ref 22–32)
CREATININE: 3.09 mg/dL — AB (ref 0.61–1.24)
Calcium: 9 mg/dL (ref 8.9–10.3)
GFR calc non Af Amer: 17 mL/min — ABNORMAL LOW (ref >60)
GFR, EST AFRICAN AMERICAN: 19 mL/min — AB (ref >60)
Glucose, Bld: 152 mg/dL — ABNORMAL HIGH (ref 70–99)
Potassium: 3.7 mmol/L (ref 3.5–5.1)
Sodium: 136 mmol/L (ref 135–145)
Total Bilirubin: 0.8 mg/dL (ref 0.3–1.2)
Total Protein: 6.6 g/dL (ref 6.5–8.1)

## 2018-03-25 LAB — TROPONIN I
Troponin I: 4.16 ng/mL (ref <0.03)
Troponin I: 7.2 ng/mL (ref <0.03)

## 2018-03-25 LAB — PROTIME-INR
INR: 1.13
Prothrombin Time: 14.4 seconds (ref 11.4–15.2)

## 2018-03-25 LAB — CBC
HCT: 27.4 % — ABNORMAL LOW (ref 39.0–52.0)
Hemoglobin: 9 g/dL — ABNORMAL LOW (ref 13.0–17.0)
MCH: 30.4 pg (ref 26.0–34.0)
MCHC: 32.8 g/dL (ref 30.0–36.0)
MCV: 92.6 fL (ref 80.0–100.0)
NRBC: 0 % (ref 0.0–0.2)
Platelets: 191 10*3/uL (ref 150–400)
RBC: 2.96 MIL/uL — ABNORMAL LOW (ref 4.22–5.81)
RDW: 13 % (ref 11.5–15.5)
WBC: 10 10*3/uL (ref 4.0–10.5)

## 2018-03-25 LAB — GLUCOSE, CAPILLARY
Glucose-Capillary: 131 mg/dL — ABNORMAL HIGH (ref 70–99)
Glucose-Capillary: 145 mg/dL — ABNORMAL HIGH (ref 70–99)

## 2018-03-25 LAB — I-STAT TROPONIN, ED
Troponin i, poc: 0.4 ng/mL (ref 0.00–0.08)
Troponin i, poc: 1.42 ng/mL (ref 0.00–0.08)

## 2018-03-25 LAB — BRAIN NATRIURETIC PEPTIDE: B Natriuretic Peptide: 669.1 pg/mL — ABNORMAL HIGH (ref 0.0–100.0)

## 2018-03-25 LAB — MRSA PCR SCREENING: MRSA by PCR: NEGATIVE

## 2018-03-25 MED ORDER — LEVOFLOXACIN IN D5W 250 MG/50ML IV SOLN
250.0000 mg | Freq: Once | INTRAVENOUS | Status: AC
  Administered 2018-03-25: 250 mg via INTRAVENOUS
  Filled 2018-03-25: qty 50

## 2018-03-25 MED ORDER — AMLODIPINE BESYLATE 10 MG PO TABS
10.0000 mg | ORAL_TABLET | Freq: Every evening | ORAL | Status: DC
  Administered 2018-03-26 – 2018-03-30 (×5): 10 mg via ORAL
  Filled 2018-03-25 (×5): qty 1

## 2018-03-25 MED ORDER — HEPARIN BOLUS VIA INFUSION
4000.0000 [IU] | Freq: Once | INTRAVENOUS | Status: AC
  Administered 2018-03-25: 4000 [IU] via INTRAVENOUS
  Filled 2018-03-25: qty 4000

## 2018-03-25 MED ORDER — ATORVASTATIN CALCIUM 10 MG PO TABS: 10.0000 mg | ORAL_TABLET | Freq: Every evening | ORAL | Status: DC

## 2018-03-25 MED ORDER — TRAMADOL HCL 50 MG PO TABS: 50.0000 mg | ORAL_TABLET | Freq: Four times a day (QID) | ORAL | Status: DC | PRN

## 2018-03-25 MED ORDER — ACETAMINOPHEN 325 MG PO TABS: 650.0000 mg | ORAL_TABLET | Freq: Four times a day (QID) | ORAL | Status: DC | PRN

## 2018-03-25 MED ORDER — HEPARIN (PORCINE) 25000 UT/250ML-% IV SOLN
900.0000 [IU]/h | INTRAVENOUS | Status: DC
  Administered 2018-03-25 – 2018-03-26 (×2): 900 [IU]/h via INTRAVENOUS
  Filled 2018-03-25 (×2): qty 250

## 2018-03-25 MED ORDER — INSULIN ASPART 100 UNIT/ML ~~LOC~~ SOLN
0.0000 [IU] | Freq: Three times a day (TID) | SUBCUTANEOUS | Status: DC
  Administered 2018-03-29: 1 [IU] via SUBCUTANEOUS

## 2018-03-25 MED ORDER — CALCIUM ACETATE (PHOS BINDER) 667 MG PO CAPS
667.0000 mg | ORAL_CAPSULE | Freq: Two times a day (BID) | ORAL | Status: DC
  Administered 2018-03-26 – 2018-04-02 (×16): 667 mg via ORAL
  Filled 2018-03-25 (×15): qty 1

## 2018-03-25 MED ORDER — FUROSEMIDE 10 MG/ML IJ SOLN
40.0000 mg | Freq: Once | INTRAMUSCULAR | Status: AC
  Administered 2018-03-25: 40 mg via INTRAVENOUS
  Filled 2018-03-25: qty 4

## 2018-03-25 MED ORDER — ACETAMINOPHEN 650 MG RE SUPP: 650.0000 mg | Freq: Four times a day (QID) | RECTAL | Status: DC | PRN

## 2018-03-25 MED ORDER — ASPIRIN EC 81 MG PO TBEC
81.0000 mg | DELAYED_RELEASE_TABLET | Freq: Every day | ORAL | Status: DC
  Administered 2018-03-26 – 2018-04-02 (×8): 81 mg via ORAL
  Filled 2018-03-25 (×8): qty 1

## 2018-03-25 MED ORDER — LEVOTHYROXINE SODIUM 50 MCG PO TABS
50.0000 ug | ORAL_TABLET | Freq: Every day | ORAL | Status: DC
  Administered 2018-03-27 – 2018-04-02 (×7): 50 ug via ORAL
  Filled 2018-03-25 (×7): qty 1

## 2018-03-25 MED ORDER — FERROUS GLUCONATE 324 (38 FE) MG PO TABS
325.0000 mg | ORAL_TABLET | Freq: Every day | ORAL | Status: DC
  Administered 2018-03-26 – 2018-03-31 (×6): 325 mg via ORAL
  Administered 2018-04-01: 324 mg via ORAL
  Administered 2018-04-02: 325 mg via ORAL
  Filled 2018-03-25 (×9): qty 1

## 2018-03-25 MED ORDER — HEPARIN SODIUM (PORCINE) 5000 UNIT/ML IJ SOLN: 5000.0000 [IU] | Freq: Three times a day (TID) | INTRAMUSCULAR | Status: DC

## 2018-03-25 MED ORDER — SODIUM CHLORIDE 0.9 % IV BOLUS
500.0000 mL | Freq: Once | INTRAVENOUS | Status: AC
  Administered 2018-03-25: 500 mL via INTRAVENOUS

## 2018-03-25 MED ORDER — FUROSEMIDE 10 MG/ML IJ SOLN
80.0000 mg | Freq: Two times a day (BID) | INTRAMUSCULAR | Status: DC
  Administered 2018-03-25 – 2018-03-26 (×3): 80 mg via INTRAVENOUS
  Filled 2018-03-25 (×4): qty 8

## 2018-03-25 MED ORDER — FUROSEMIDE 10 MG/ML IJ SOLN
80.0000 mg | Freq: Once | INTRAMUSCULAR | Status: AC
  Administered 2018-03-25: 80 mg via INTRAVENOUS
  Filled 2018-03-25: qty 8

## 2018-03-25 NOTE — ED Notes (Signed)
Dr.Bero made aware of istat troponin level. Ed-lab

## 2018-03-25 NOTE — Progress Notes (Signed)
CRITICAL VALUE ALERT  Critical Value:  Troponin 4.16  Date & Time Notied:  1640 1/28  Provider Notified: Raelyn Mora  Orders Received/Actions taken: Heparin drip ordered

## 2018-03-25 NOTE — H&P (Signed)
History and Physical    Henry [name] TDD:[id number] DOB: 01/27/1925 DOA: 03/25/2018  PCP: [name], MD  Patient coming from: Home.  I have personally briefly reviewed patient's old medical records available.   Chief Complaint: Shortness of breath.  HPI: Henry [name] is a 85 y.o. male with medical history significant of chronic kidney disease stage IV, coronary artery disease, type 2 diabetes without complications currently not on treatment, hyperlipidemia, hypertension, anemia of chronic disease presents to the emergency room with 2 days of worsening shortness of breath and chest discomfort.  According to the patient, he underwent a fistula placement on his left arm for planning of hemodialysis in the near future.  He does not have any pulmonary problems.  He is not on oxygen at home.  He was feeling slightly short of breath 2 days ago, this is progressively worsened and since yesterday afternoon he has been feeling short of breath on minimal ambulation.  Denies any fever chills.  Denies any cough.  Denies any flulike symptoms.  No wheezing.  Along with shortness of breath, he had dull central chest pain, about 4 out of 10 and mostly associated heaviness.  This morning he did not feel good.  He felt weak, he feels dizzy so came to ER. ED Course: His blood pressures are stable in the ER.  Patient was found with increased respiratory rate and in respiratory distress, was a started on BiPAP and he feels somehow improved with BiPAP.  His hemoglobin is low but at his baseline.  Bicarb is 19.  BUN is 64/3.09 which is mildly elevated than his baseline.  His baseline creatinine about 2.4-2.6.  A chest x-ray showed pulmonary edema, right more than left.  His point-of-care troponin and subsequent troponin were elevated.  He is currently chest pain-free.  Duplex of the extremities were negative for DVT.  VQ scan was ordered, he is on BiPAP so unable to do it.  Patient is stabilizing on BiPAP.  He was  given 40 mg of IV Lasix.  He was also given 250 mg of IV Levaquin. Cardiology was notified from ER. I also discussed case with nephrology.  Review of Systems: As per HPI otherwise 10 point review of systems negative.    Past Medical History:  Diagnosis Date  . Anemia   . CAD of autologous bypass graft   . Carotid artery disease ([place])   . Diabetes mellitus without complication ([place])   . DJD (degenerative joint disease)   . History of anemia of chronic disease   . Hypercholesteremia   . Hypertension   . PVD (peripheral vascular disease) with claudication ([place])   . Renal insufficiency   . Wears dentures   . Wears glasses     Past Surgical History:  Procedure Laterality Date  . AV FISTULA PLACEMENT Left 03/20/2018   Procedure: ARTERIOVENOUS (AV) FISTULA CREATION LEFT ARM;  Surgeon: Serafina Mitchell, MD;  Location: [facility] OR;  Service: Vascular;  Laterality: Left;  . cataract surgery    . COLONOSCOPY     Hx: of  . CORONARY ARTERY BYPASS GRAFT  2000   by Dr. Cyndia Bent  . LUMBAR LAMINECTOMY/DECOMPRESSION MICRODISCECTOMY N/A 09/10/2012   Procedure: LUMBAR DECOMPRESSION,  IN SITU FUSION LUMBAR 4-5;  Surgeon: Melina Schools, MD;  Location: [facility];  Service: Orthopedics;  Laterality: N/A;  . MULTIPLE TOOTH EXTRACTIONS    . TONSILLECTOMY       reports that he quit smoking about 65 years ago. His smoking  use included cigarettes. He has a 10.00 pack-year smoking history. He has never used smokeless tobacco. He reports previous alcohol use. He reports that he does not use drugs.  No Known Allergies  History reviewed. No pertinent family history.   Prior to Admission medications   Medication Sig Start Date End Date Taking? Authorizing Provider  amLODipine (NORVASC) 10 MG tablet Take 10 mg by mouth every evening.     [provider]  aspirin EC 81 MG tablet Take 81 mg by mouth daily.     [provider]  atorvastatin (LIPITOR) 20 MG tablet Take 10 mg by mouth every evening.      [provider]  calcium acetate (PHOSLO) 667 MG capsule Take 667 mg by mouth 2 (two) times daily.    [provider]  cilostazol (PLETAL) 100 MG tablet Take 1 tablet (100 mg total) by mouth 2 (two) times daily. 03/26/17   Wellington Hampshire, MD  ferrous gluconate (FERGON) 325 MG tablet Take 325 mg by mouth daily.     [provider]  furosemide (LASIX) 40 MG tablet Take 40 mg by mouth daily.    [provider]  glucose blood (ACCU-CHEK AVIVA PLUS) test strip CHECK BLOOD SUGAR TWICE A DAY. 03/29/15   Noralee Space, MD  Lancets (ACCU-CHEK MULTICLIX) lancets USE TWICE DAILY. 03/29/15   Noralee Space, MD  levothyroxine (SYNTHROID, LEVOTHROID) 50 MCG tablet TAKE ONE TABLET BY MOUTH ONCE DAILY BEFORE BREAKFAST Patient taking differently: Take 50 mcg by mouth daily before breakfast.  01/18/17   Noralee Space, MD  Multiple Vitamin (MULTIVITAMIN WITH MINERALS) TABS tablet Take 1 tablet by mouth daily. Centrum Silver Vitamin C    [provider]  Multiple Vitamins-Minerals (PRESERVISION AREDS 2 PO) Take 1 tablet by mouth daily.    [provider]  traMADol (ULTRAM) 50 MG tablet Take 1 tablet (50 mg total) by mouth every 6 (six) hours as needed. 03/20/18   Serafina Mitchell, MD  Vitamin D, Ergocalciferol, (DRISDOL) 1.25 MG (50000 UT) CAPS capsule Take 50,000 Units by mouth 2 (two) times a week. Mondays & Thursdays    [provider]    Physical Exam: Vitals:   03/25/18 1016 03/25/18 1023 03/25/18 1030 03/25/18 1135  BP: (!) 124/58  (!) 127/57 (!) 129/50  Pulse: 71 84 70 71  Resp: (!) 22 (!) 22 18 17   Temp:      TempSrc:      SpO2: 100% 92% 97% 98%    Constitutional: NAD, calm, comfortable Vitals:   03/25/18 1016 03/25/18 1023 03/25/18 1030 03/25/18 1135  BP: (!) 124/58  (!) 127/57 (!) 129/50  Pulse: 71 84 70 71  Resp: (!) 22 (!) 22 18 17   Temp:      TempSrc:      SpO2: 100% 92% 97% 98%   Eyes: PERRL, lids and conjunctivae  normal Patient is currently on BiPAP and he is in moderate respiratory distress. He can keep up with conversations. ENMT: Mucous membranes are moist. Posterior pharynx clear of any exudate or lesions.Normal dentition.  Neck: normal, supple, no masses, no thyromegaly Respiratory: Poor air entry bilateral.  He does have expiratory crackles on bilateral bases.  No accessory muscle use.  Cardiovascular: Regular rate and rhythm, no murmurs / rubs / gallops.  Bilateral pedal edema 2+, right more than left. 2+ pedal pulses. No carotid bruits.  Abdomen: no tenderness, no masses palpated. No hepatosplenomegaly. Bowel sounds positive.  Distended but nontender.  Musculoskeletal: no clubbing / cyanosis. No joint deformity upper and lower extremities. Good ROM, no contractures. Normal muscle tone.  Surgical scar on the left elbow is clean and dry.  Minimal surrounding induration, no redness or erythema.  Distal neurovascular status intact. Skin: no rashes, lesions, ulcers. No induration Neurologic: CN 2-12 grossly intact. Sensation intact, DTR normal. Strength 5/5 in all 4.  Psychiatric: Normal judgment and insight. Alert and oriented x 3. Normal mood.     Labs on Admission: I have personally reviewed following labs and imaging studies  CBC: Recent Labs  Lab 03/20/18 0614 03/25/18 0808  WBC  --  10.0  HGB 7.8* 9.0*  HCT 23.0* 27.4*  MCV  --  92.6  PLT  --  175   Basic Metabolic Panel: Recent Labs  Lab 03/20/18 0614 03/25/18 0808  NA 129*  135 136  K >8.5*  4.0 3.7  CL 104 106  CO2 23 19*  GLUCOSE 103*  108* 152*  BUN 57* 64*  CREATININE 2.44* 3.09*  CALCIUM 8.8* 9.0   GFR: Estimated Creatinine Clearance: 15 mL/min (A) (by C-G formula based on SCr of 3.09 mg/dL (H)). Liver Function Tests: Recent Labs  Lab 03/25/18 0808  AST 26  ALT 15  ALKPHOS 78  BILITOT 0.8  PROT 6.6  ALBUMIN 3.6   No results for input(s): LIPASE, AMYLASE in the last 168 hours. No results for input(s):  AMMONIA in the last 168 hours. Coagulation Profile: Recent Labs  Lab 03/25/18 0808  INR 1.13   Cardiac Enzymes: No results for input(s): CKTOTAL, CKMB, CKMBINDEX, TROPONINI in the last 168 hours. BNP (last 3 results) No results for input(s): PROBNP in the last 8760 hours. HbA1C: No results for input(s): HGBA1C in the last 72 hours. CBG: Recent Labs  Lab 03/20/18 0556 03/20/18 0924  GLUCAP 95 99   Lipid Profile: No results for input(s): CHOL, HDL, LDLCALC, TRIG, CHOLHDL, LDLDIRECT in the last 72 hours. Thyroid Function Tests: No results for input(s): TSH, T4TOTAL, FREET4, T3FREE, THYROIDAB in the last 72 hours. Anemia Panel: No results for input(s): VITAMINB12, FOLATE, FERRITIN, TIBC, IRON, RETICCTPCT in the last 72 hours. Urine analysis:    Component Value Date/Time   COLORURINE YELLOW 04/10/2012 1929   APPEARANCEUR CLEAR 04/10/2012 1929   LABSPEC 1.016 04/10/2012 1929   PHURINE 7.0 04/10/2012 1929   GLUCOSEU 250 (A) 04/10/2012 1929   HGBUR NEGATIVE 04/10/2012 1929   BILIRUBINUR NEGATIVE 04/10/2012 1929   KETONESUR NEGATIVE 04/10/2012 1929   PROTEINUR 100 (A) 04/10/2012 1929   UROBILINOGEN 0.2 04/10/2012 1929   NITRITE NEGATIVE 04/10/2012 1929   LEUKOCYTESUR NEGATIVE 04/10/2012 1929    Radiological Exams on Admission: Dg Chest Port 1 View  Result Date: 03/25/2018 CLINICAL DATA:  Shortness of breath and chest pain. EXAM: PORTABLE CHEST 1 VIEW COMPARISON:  01/06/2018 FINDINGS: Previous median sternotomy and CABG procedure. Mild cardiac enlargement. There is diffuse pulmonary edema which is new from previous exam. Airspace consolidation within the right midlung and right base is also new. IMPRESSION: 1. Moderate pulmonary edema. 2. Right midlung and right base airspace opacities which may represent pneumonia and/or asymmetric edema. Electronically Signed   By: Kerby Moors M.D.   On: 03/25/2018 09:13   Vas [place] Lower Extremity Venous (dvt) (only [facility])  Result Date:  03/25/2018  Lower Venous Study Indications: Swelling.  Performing Technologist: Abram Sander RVS  Examination Guidelines: A complete evaluation includes B-mode imaging, spectral Doppler, color Doppler, and power Doppler as needed of all  accessible portions of each vessel. Bilateral testing is considered an integral part of a complete examination. Limited examinations for reoccurring indications may be performed as noted.  Right Venous Findings: +---------+---------------+---------+-----------+----------+-------+          CompressibilityPhasicitySpontaneityPropertiesSummary +---------+---------------+---------+-----------+----------+-------+ CFV      Full           Yes      Yes                          +---------+---------------+---------+-----------+----------+-------+ SFJ      Full                                                 +---------+---------------+---------+-----------+----------+-------+ FV Prox  Full                                                 +---------+---------------+---------+-----------+----------+-------+ FV Mid   Full                                                 +---------+---------------+---------+-----------+----------+-------+ FV DistalFull                                                 +---------+---------------+---------+-----------+----------+-------+ PFV      Full                                                 +---------+---------------+---------+-----------+----------+-------+ POP      Full           Yes      Yes                          +---------+---------------+---------+-----------+----------+-------+ PTV      Full                                                 +---------+---------------+---------+-----------+----------+-------+ PERO     Full                                                 +---------+---------------+---------+-----------+----------+-------+  Left Venous Findings:  +---------+---------------+---------+-----------+----------+-------+          CompressibilityPhasicitySpontaneityPropertiesSummary +---------+---------------+---------+-----------+----------+-------+ CFV      Full           Yes      Yes                          +---------+---------------+---------+-----------+----------+-------+ SFJ      Full                                                 +---------+---------------+---------+-----------+----------+-------+  FV Prox  Full                                                 +---------+---------------+---------+-----------+----------+-------+ FV Mid   Full                                                 +---------+---------------+---------+-----------+----------+-------+ FV DistalFull                                                 +---------+---------------+---------+-----------+----------+-------+ PFV      Full                                                 +---------+---------------+---------+-----------+----------+-------+ POP      Full           Yes      Yes                          +---------+---------------+---------+-----------+----------+-------+ PTV      Full                                                 +---------+---------------+---------+-----------+----------+-------+ PERO     Full                                                 +---------+---------------+---------+-----------+----------+-------+    Summary: Right: There is no evidence of deep vein thrombosis in the lower extremity. No cystic structure found in the popliteal fossa. Left: There is no evidence of deep vein thrombosis in the lower extremity. No cystic structure found in the popliteal fossa.  *See table(s) above for measurements and observations. Electronically signed by Ruta Hinds MD on 03/25/2018 at 11:55:06 AM.    Final    Ue Venous Duplex ([facility] Only)  Result Date: 03/25/2018 UPPER VENOUS STUDY  Indications:  Swelling Performing Technologist: Abram Sander RVS  Examination Guidelines: A complete evaluation includes B-mode imaging, spectral Doppler, color Doppler, and power Doppler as needed of all accessible portions of each vessel. Bilateral testing is considered an integral part of a complete examination. Limited examinations for reoccurring indications may be performed as noted.  Left Findings: +----------+------------+----------+---------+-----------+-------+ LEFT      CompressiblePropertiesPhasicitySpontaneousSummary +----------+------------+----------+---------+-----------+-------+ IJV           Full                 Yes       Yes            +----------+------------+----------+---------+-----------+-------+ Subclavian    Full                 Yes  Yes            +----------+------------+----------+---------+-----------+-------+ Axillary      Full                 Yes       Yes            +----------+------------+----------+---------+-----------+-------+ Brachial      Full                 Yes       Yes            +----------+------------+----------+---------+-----------+-------+ Radial        Full                                          +----------+------------+----------+---------+-----------+-------+ Ulnar         Full                                          +----------+------------+----------+---------+-----------+-------+ Cephalic      Full                                          +----------+------------+----------+---------+-----------+-------+ Basilic       Full                                          +----------+------------+----------+---------+-----------+-------+  Summary:  Left: No evidence of deep vein thrombosis in the upper extremity. No evidence of superficial vein thrombosis in the upper extremity.  *See table(s) above for measurements and observations.  Diagnosing physician: Ruta Hinds MD Electronically signed by Ruta Hinds  MD on 03/25/2018 at 11:55:14 AM.    Final     EKG: Independently reviewed.  Sinus rhythm.  Anterior lead has less than 1 mm ST elevation.  Not present on previous EKGs.  Assessment/Plan Principal Problem:   Respiratory failure with hypoxia (HCC) Active Problems:   HYPERCHOLESTEROLEMIA   Anemia of chronic disease   Diabetes mellitus type 2, uncomplicated (HCC)   Carotid artery disease (HCC)   Hypertension   Chronic kidney disease (CKD), stage IV (severe) (HCC)   Pulmonary edema   Respiratory failure, acute (HCC)   Elevated troponin   Acute respiratory failure with hypoxia: Suspected due to flash pulmonary edema.  Admit to stepdown unit given severity of symptoms.  Keep on BiPAP as needed and on nasal cannula oxygen to keep saturation more than 90%.  Patient was given 40 mg of IV Lasix in the ER, I discussed with nephrology to re-dose further IV diuresis.  Patient was given a dose of Levaquin in the ER, will hold off on further antibiotics given no clear evidence of infection.  Will closely monitor and dose antibiotics according to his renal function in 48 hours if needed.  Elevated troponin: With minimal EKG changes.  Currently without chest pain.Patient does have history of coronary artery disease.  He is at high risk of developing acute coronary syndrome.  Cycle troponins and EKG.  Continue aspirin from home.  Patient is on a statin that we will continue.  Will be seen by cardiology,  will follow the recommendations. Patient has fairly acute onset of shortness of breath and flash pulmonary edema, will send for VQ scan once patient is able to be weaned off the BiPAP.  Type 2 diabetes: Currently not on treatment.  Will keep on sliding scale insulin.  Hypertension: Fairly controlled.  On amlodipine that he will resume from home.  Anemia of chronic disease: His hemoglobin is stable.  Acute pulmonary edema associated with acute renal failure on chronic stage IV chronic kidney  disease: Patient currently with no clear indication for hemodialysis.  He has evidence of acute kidney injury.  Will need IV diuresis.  Patient with normal potassium now.  Will be followed by nephrology.   DVT prophylaxis: Heparin subcu. Code Status: Full code. Family Communication: No family at bedside. Disposition Plan: Home when is stable. Consults called: Cardiology, nephrology. Admission status: Inpatient, stepdown.   Barb Merino MD [facility] Pager [phone]  If 7PM-7AM, please contact night-coverage [email or web address] Password [facility]  03/25/2018, 12:19 PM

## 2018-03-25 NOTE — Telephone Encounter (Signed)
Will route to Lenice Llamas, RN and Dr. Burt Knack as Juluis Rainier.

## 2018-03-25 NOTE — Consult Note (Signed)
Reason for Consult: Renal failure Referring Physician:  Dr. Sloan Leiter  Chief Complaint:  Shortness of breath  Assessment/Plan: 1. Acute on CKD5 with BL Cr 2.4-2.8 nearing dialysis and e/o volume overload by history and exam. - Agree with IV diuresis; hopefully he will respond with good UOP and stabilization of renal function which would be c/w cardiorenal syndrome. I would like to hold off on dialysis till the fistula is ready but he may not make it till then. I spent time educating and counseling the daughter who was bedside and the pt. - Plan is to hold off on dialysis unless there are absolute indications (ie hyperkalemia, symptomatic volume overload refractory to diuresis); we will have a better idea over the next 24-48 hrs. 2. Elevated troponin with h/o CASHD being r/o for PE. 3. DM 4. HTN 5. Respiratory distress with vol overload on CXR and PE. 6. Anemia - will check iron panel 7. HPT - check phos and pth    HPI: Henry [name] is an 85 y.o. male CKD5 with baseline creatinine in the mid to high 2's followed by Dr. Marval Regal, CASHD, DM, HLD, HTN with recent left BCF placed by Dr. 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 Burt Knack w/ rec of holding the bblr and arranging a 24hr holter 2 days later. Pt now presenting with worsening dyspnea and decreased exercise tolerance with associated chest heaviness with no radiation 4/10 pain. He denies any cough, f/c/n/v or anorexia. In the ED his sats were in the 80's and he was started on BiPAP with some clinical improvement. His BUN/Cr was now 64/3.09 and CXR c/w pulmonary edema with elevated troponins. Duples of the LE were neg for DVT's and he was given Lasix 40mg  IV x1. He's usually on 40mg  twice daily at home and has also reported increased lower extremity swelling. He denies any NSAID use, rashes, photophobia.   ROS Pertinent items are noted in HPI.  Chemistry and CBC: Creatinine  Date/Time Value Ref Range  Status  02/04/2007 10:19 AM 1.58 (H) 0.40 - 1.50 mg/dL Final   Creat  Date/Time Value Ref Range Status  01/31/2015 02:08 PM 2.37 (H) 0.70 - 1.11 mg/dL Final   Creatinine, Ser  Date/Time Value Ref Range Status  03/25/2018 08:08 AM 3.09 (H) 0.61 - 1.24 mg/dL Final  03/20/2018 06:14 AM 2.44 (H) 0.61 - 1.24 mg/dL Final  01/01/2018 01:18 PM 2.48 (H) 0.40 - 1.50 mg/dL Final  01/22/2017 11:55 AM 2.68 (H) 0.40 - 1.50 mg/dL Final  07/30/2016 12:04 PM 2.54 (H) 0.40 - 1.50 mg/dL Final  01/30/2016 12:04 PM 2.36 (H) 0.40 - 1.50 mg/dL Final  08/16/2015 11:33 AM 2.17 (H) 0.40 - 1.50 mg/dL Final  03/23/2015 12:36 PM 1.94 (H) 0.40 - 1.50 mg/dL Final  08/31/2014 11:08 AM 2.01 (H) 0.40 - 1.50 mg/dL Final  03/03/2014 09:08 AM 1.8 (H) 0.4 - 1.5 mg/dL Final  08/26/2013 11:18 AM 1.8 (H) 0.4 - 1.5 mg/dL Final  02/11/2013 09:29 AM 1.8 (H) 0.4 - 1.5 mg/dL Final  09/10/2012 01:47 PM 1.63 (H) 0.50 - 1.35 mg/dL Final  08/19/2012 11:15 AM 1.7 (H) 0.4 - 1.5 mg/dL Final  04/11/2012 04:40 AM 1.42 (H) 0.50 - 1.35 mg/dL Final  04/10/2012 11:30 PM 1.40 (H) 0.50 - 1.35 mg/dL Final  04/10/2012 04:40 PM 1.57 (H) 0.50 - 1.35 mg/dL Final  03/03/2012 10:01 AM 1.7 (H) 0.4 - 1.5 mg/dL Final  02/22/2011 02:47 PM 1.8 (H) 0.4 - 1.5 mg/dL  Final  01/03/2010 10:38 AM 2.1 (H) 0.4 - 1.5 mg/dL Final  06/14/2009 10:24 AM 1.9 (H) 0.4 - 1.5 mg/dL Final  12/07/2008 09:26 AM 1.6 (H) 0.4 - 1.5 mg/dL Final  06/08/2008 10:14 AM 1.6 (H) 0.4 - 1.5 mg/dL Final  11/13/2007 10:47 AM 1.6 (H) 0.4 - 1.5 mg/dL Final  08/07/2007 09:41 AM 1.6 (H) 0.4 - 1.5 mg/dL Final  04/04/2007 10:49 AM 1.6 (H) 0.4 - 1.5 mg/dL Final  01/29/2007 11:01 AM 1.80 (H) 0.40 - 1.50 mg/dL Final  11/01/2006 10:21 AM 2.0 (H) 0.4 - 1.5 mg/dL Final  07/30/2006 11:39 AM 1.7 (H) 0.4 - 1.5 mg/dL Final  04/22/2006 04:57 PM 1.6 (H) 0.4 - 1.5 mg/dL Final   Recent Labs  Lab 03/20/18 0614 03/25/18 0808  NA 129*  135 136  K >8.5*  4.0 3.7  CL 104 106  CO2 23 19*  GLUCOSE  103*  108* 152*  BUN 57* 64*  CREATININE 2.44* 3.09*  CALCIUM 8.8* 9.0   Recent Labs  Lab 03/20/18 0614 03/25/18 0808  WBC  --  10.0  HGB 7.8* 9.0*  HCT 23.0* 27.4*  MCV  --  92.6  PLT  --  191   Liver Function Tests: Recent Labs  Lab 03/25/18 0808  AST 26  ALT 15  ALKPHOS 78  BILITOT 0.8  PROT 6.6  ALBUMIN 3.6   No results for input(s): LIPASE, AMYLASE in the last 168 hours. No results for input(s): AMMONIA in the last 168 hours. Cardiac Enzymes: Recent Labs  Lab 03/25/18 1422  TROPONINI 4.16*   Iron Studies: No results for input(s): IRON, TIBC, TRANSFERRIN, FERRITIN in the last 72 hours. PT/INR: @LABRCNTIP (inr:5)  Xrays/Other Studies: ) Results for orders placed or performed during the hospital encounter of 03/25/18 (from the past 48 hour(s))  CBC     Status: Abnormal   Collection Time: 03/25/18  8:08 AM  Result Value Ref Range   WBC 10.0 4.0 - 10.5 K/uL   RBC 2.96 (L) 4.22 - 5.81 MIL/uL   Hemoglobin 9.0 (L) 13.0 - 17.0 g/dL   HCT 27.4 (L) 39.0 - 52.0 %   MCV 92.6 80.0 - 100.0 fL   MCH 30.4 26.0 - 34.0 pg   MCHC 32.8 30.0 - 36.0 g/dL   RDW 13.0 11.5 - 15.5 %   Platelets 191 150 - 400 K/uL   nRBC 0.0 0.0 - 0.2 %    Comment: Performed at [facility], [place] [address], [place], [place] [address]  Comprehensive metabolic panel     Status: Abnormal   Collection Time: 03/25/18  8:08 AM  Result Value Ref Range   Sodium 136 135 - 145 mmol/L   Potassium 3.7 3.5 - 5.1 mmol/L   Chloride 106 98 - 111 mmol/L   CO2 19 (L) 22 - 32 mmol/L   Glucose, Bld 152 (H) 70 - 99 mg/dL   BUN 64 (H) 8 - 23 mg/dL   Creatinine, Ser 3.09 (H) 0.61 - 1.24 mg/dL   Calcium 9.0 8.9 - 10.3 mg/dL   Total Protein 6.6 6.5 - 8.1 g/dL   Albumin 3.6 3.5 - 5.0 g/dL   AST 26 15 - 41 U/L   ALT 15 0 - 44 U/L   Alkaline Phosphatase 78 38 - 126 U/L   Total Bilirubin 0.8 0.3 - 1.2 mg/dL   GFR calc non Af Amer 17 (L) >60 mL/min   GFR calc Af Amer 19 (L) >60 mL/min   Anion gap  11 5 - 15     Comment: Performed at [facility], [place] [address] [place], [place] [address]  Protime-INR     Status: None   Collection Time: 03/25/18  8:08 AM  Result Value Ref Range   Prothrombin Time 14.4 11.4 - 15.2 seconds   INR 1.13     Comment: Performed at [facility] [address] [place], [place] [address]  Brain natriuretic peptide     Status: Abnormal   Collection Time: 03/25/18  8:08 AM  Result Value Ref Range   B Natriuretic Peptide 669.1 (H) 0.0 - 100.0 pg/mL    Comment: Performed at [facility] [address], [place], [place] [address]  I-stat troponin, ED     Status: Abnormal   Collection Time: 03/25/18  8:24 AM  Result Value Ref Range   Troponin i, poc 0.40 (HH) 0.00 - 0.08 ng/mL   Comment NOTIFIED PHYSICIAN    Comment 3            Comment: Due to the release kinetics of cTnI, a negative result within the first hours of the onset of symptoms does not rule out myocardial infarction with certainty. If myocardial infarction is still suspected, repeat the test at appropriate intervals.   I-stat troponin, ED     Status: Abnormal   Collection Time: 03/25/18 10:56 AM  Result Value Ref Range   Troponin i, poc 1.42 (HH) 0.00 - 0.08 ng/mL   Comment NOTIFIED PHYSICIAN    Comment 3            Comment: Due to the release kinetics of cTnI, a negative result within the first hours of the onset of symptoms does not rule out myocardial infarction with certainty. If myocardial infarction is still suspected, repeat the test at appropriate intervals.   Troponin I - Now Then Q6H     Status: Abnormal   Collection Time: 03/25/18  2:22 PM  Result Value Ref Range   Troponin I 4.16 (HH) <0.03 ng/mL    Comment: CRITICAL RESULT CALLED TO, READ BACK BY AND VERIFIED WITH: Neysa Bonito 1555 03/25/2018 D BRADLEY Performed at [facility], [place] [address], [place], [place] [address]   Glucose, capillary     Status: Abnormal   Collection Time: 03/25/18  4:54 PM   Result Value Ref Range   Glucose-Capillary 131 (H) 70 - 99 mg/dL   Dg Chest Port 1 View  Result Date: 03/25/2018 CLINICAL DATA:  Shortness of breath and chest pain. EXAM: PORTABLE CHEST 1 VIEW COMPARISON:  01/06/2018 FINDINGS: Previous median sternotomy and CABG procedure. Mild cardiac enlargement. There is diffuse pulmonary edema which is new from previous exam. Airspace consolidation within the right midlung and right base is also new. IMPRESSION: 1. Moderate pulmonary edema. 2. Right midlung and right base airspace opacities which may represent pneumonia and/or asymmetric edema. Electronically Signed   By: Kerby Moors M.D.   On: 03/25/2018 09:13   Vas [place] Lower Extremity Venous (dvt) (only [facility])  Result Date: 03/25/2018  Lower Venous Study Indications: Swelling.  Performing Technologist: Abram Sander RVS  Examination Guidelines: A complete evaluation includes B-mode imaging, spectral Doppler, color Doppler, and power Doppler as needed of all accessible portions of each vessel. Bilateral testing is considered an integral part of a complete examination. Limited examinations for reoccurring indications may be performed as noted.  Right Venous Findings: +---------+---------------+---------+-----------+----------+-------+          CompressibilityPhasicitySpontaneityPropertiesSummary +---------+---------------+---------+-----------+----------+-------+  CFV      Full           Yes      Yes                          +---------+---------------+---------+-----------+----------+-------+ SFJ      Full                                                 +---------+---------------+---------+-----------+----------+-------+ FV Prox  Full                                                 +---------+---------------+---------+-----------+----------+-------+ FV Mid   Full                                                 +---------+---------------+---------+-----------+----------+-------+ FV  DistalFull                                                 +---------+---------------+---------+-----------+----------+-------+ PFV      Full                                                 +---------+---------------+---------+-----------+----------+-------+ POP      Full           Yes      Yes                          +---------+---------------+---------+-----------+----------+-------+ PTV      Full                                                 +---------+---------------+---------+-----------+----------+-------+ PERO     Full                                                 +---------+---------------+---------+-----------+----------+-------+  Left Venous Findings: +---------+---------------+---------+-----------+----------+-------+          CompressibilityPhasicitySpontaneityPropertiesSummary +---------+---------------+---------+-----------+----------+-------+ CFV      Full           Yes      Yes                          +---------+---------------+---------+-----------+----------+-------+ SFJ      Full                                                 +---------+---------------+---------+-----------+----------+-------+  FV Prox  Full                                                 +---------+---------------+---------+-----------+----------+-------+ FV Mid   Full                                                 +---------+---------------+---------+-----------+----------+-------+ FV DistalFull                                                 +---------+---------------+---------+-----------+----------+-------+ PFV      Full                                                 +---------+---------------+---------+-----------+----------+-------+ POP      Full           Yes      Yes                          +---------+---------------+---------+-----------+----------+-------+ PTV      Full                                                  +---------+---------------+---------+-----------+----------+-------+ PERO     Full                                                 +---------+---------------+---------+-----------+----------+-------+    Summary: Right: There is no evidence of deep vein thrombosis in the lower extremity. No cystic structure found in the popliteal fossa. Left: There is no evidence of deep vein thrombosis in the lower extremity. No cystic structure found in the popliteal fossa.  *See table(s) above for measurements and observations. Electronically signed by Ruta Hinds MD on 03/25/2018 at 11:55:06 AM.    Final    Ue Venous Duplex ([facility] Only)  Result Date: 03/25/2018 UPPER VENOUS STUDY  Indications: Swelling Performing Technologist: Abram Sander RVS  Examination Guidelines: A complete evaluation includes B-mode imaging, spectral Doppler, color Doppler, and power Doppler as needed of all accessible portions of each vessel. Bilateral testing is considered an integral part of a complete examination. Limited examinations for reoccurring indications may be performed as noted.  Left Findings: +----------+------------+----------+---------+-----------+-------+ LEFT      CompressiblePropertiesPhasicitySpontaneousSummary +----------+------------+----------+---------+-----------+-------+ IJV           Full                 Yes       Yes            +----------+------------+----------+---------+-----------+-------+ Subclavian    Full                 Yes  Yes            +----------+------------+----------+---------+-----------+-------+ Axillary      Full                 Yes       Yes            +----------+------------+----------+---------+-----------+-------+ Brachial      Full                 Yes       Yes            +----------+------------+----------+---------+-----------+-------+ Radial        Full                                           +----------+------------+----------+---------+-----------+-------+ Ulnar         Full                                          +----------+------------+----------+---------+-----------+-------+ Cephalic      Full                                          +----------+------------+----------+---------+-----------+-------+ Basilic       Full                                          +----------+------------+----------+---------+-----------+-------+  Summary:  Left: No evidence of deep vein thrombosis in the upper extremity. No evidence of superficial vein thrombosis in the upper extremity.  *See table(s) above for measurements and observations.  Diagnosing physician: Ruta Hinds MD Electronically signed by Ruta Hinds MD on 03/25/2018 at 11:55:14 AM.    Final     PMH:   Past Medical History:  Diagnosis Date  . Anemia   . CAD of autologous bypass graft   . Carotid artery disease ([place])   . Diabetes mellitus without complication ([place])   . DJD (degenerative joint disease)   . History of anemia of chronic disease   . Hypercholesteremia   . Hypertension   . PVD (peripheral vascular disease) with claudication ([place])   . Renal insufficiency   . Wears dentures   . Wears glasses     PSH:   Past Surgical History:  Procedure Laterality Date  . AV FISTULA PLACEMENT Left 03/20/2018   Procedure: ARTERIOVENOUS (AV) FISTULA CREATION LEFT ARM;  Surgeon: Serafina Mitchell, MD;  Location: [facility] OR;  Service: Vascular;  Laterality: Left;  . cataract surgery    . COLONOSCOPY     Hx: of  . CORONARY ARTERY BYPASS GRAFT  2000   by Dr. Cyndia Bent  . LUMBAR LAMINECTOMY/DECOMPRESSION MICRODISCECTOMY N/A 09/10/2012   Procedure: LUMBAR DECOMPRESSION,  IN SITU FUSION LUMBAR 4-5;  Surgeon: Melina Schools, MD;  Location: [facility];  Service: Orthopedics;  Laterality: N/A;  . MULTIPLE TOOTH EXTRACTIONS    . TONSILLECTOMY      Allergies: No Known Allergies  Medications:   Prior to Admission medications    Medication Sig Start Date End Date Taking? Authorizing Provider  amLODipine (NORVASC) 10 MG tablet Take 10 mg by mouth  every evening.    Yes [provider]  aspirin EC 81 MG tablet Take 81 mg by mouth daily.    Yes [provider]  atorvastatin (LIPITOR) 20 MG tablet Take 10 mg by mouth every evening.    Yes [provider]  calcium acetate (PHOSLO) 667 MG capsule Take 667 mg by mouth 2 (two) times daily.   Yes [provider]  carvedilol (COREG) 12.5 MG tablet Take 12.5 mg by mouth 2 (two) times daily with a meal.   Yes [provider]  ferrous gluconate (FERGON) 325 MG tablet Take 325 mg by mouth daily.    Yes [provider]  furosemide (LASIX) 40 MG tablet Take 40 mg by mouth daily.   Yes [provider]  levothyroxine (SYNTHROID, LEVOTHROID) 50 MCG tablet TAKE ONE TABLET BY MOUTH ONCE DAILY BEFORE BREAKFAST Patient taking differently: Take 50 mcg by mouth daily before breakfast.  01/18/17  Yes Noralee Space, MD  Multiple Vitamin (MULTIVITAMIN WITH MINERALS) TABS tablet Take 1 tablet by mouth daily. Centrum Silver Vitamin C   Yes [provider]  Multiple Vitamins-Minerals (PRESERVISION AREDS 2 PO) Take 1 tablet by mouth daily.   Yes [provider]  traMADol (ULTRAM) 50 MG tablet Take 1 tablet (50 mg total) by mouth every 6 (six) hours as needed. 03/20/18  Yes Serafina Mitchell, MD  Vitamin D, Ergocalciferol, (DRISDOL) 1.25 MG (50000 UT) CAPS capsule Take 50,000 Units by mouth 2 (two) times a week. Mondays & Thursdays   Yes [provider]  cilostazol (PLETAL) 100 MG tablet Take 1 tablet (100 mg total) by mouth 2 (two) times daily. 03/26/17   Wellington Hampshire, MD  glucose blood (ACCU-CHEK AVIVA PLUS) test strip CHECK BLOOD SUGAR TWICE A DAY. 03/29/15   Noralee Space, MD  Lancets (ACCU-CHEK MULTICLIX) lancets USE TWICE DAILY. 03/29/15   Noralee Space, MD    Discontinued Meds:   Medications Discontinued  During This Encounter  Medication Reason  . heparin injection 5,000 Units Change in therapy    Social History:  reports that he quit smoking about 65 years ago. His smoking use included cigarettes. He has a 10.00 pack-year smoking history. He has never used smokeless tobacco. He reports previous alcohol use. He reports that he does not use drugs.  Family History:  History reviewed. No pertinent family history.  Blood pressure 126/62, pulse 76, temperature (!) 97.5 F (36.4 C), temperature source Axillary, resp. rate (!) 27, height 5\' 9"  (1.753 m), weight 72.4 kg, SpO2 100 %. General appearance: alert, cooperative and appears stated age Head: Normocephalic, without obvious abnormality, atraumatic Eyes: negative Neck: no adenopathy, no carotid bruit, supple, symmetrical, trachea midline and thyroid not enlarged, symmetric, no tenderness/mass/nodules Back: symmetric, no curvature. ROM normal. No CVA tenderness. Resp: rales bibasilar and bilaterally Chest wall: no tenderness Cardio: regular rate and rhythm, S1, S2 normal, no murmur, click, rub or gallop GI: soft, non-tender; bowel sounds normal; no masses,  no organomegaly Extremities: edema 2+ Pulses: 2+ and symmetric Skin: Skin color, texture, turgor normal. No rashes or lesions Lymph nodes: Cervical, supraclavicular, and axillary nodes normal. Neurologic: Grossly normal Access: left BCF positive bruit       Breannah Kratt, Hunt Oris, MD 03/25/2018, 5:48 PM

## 2018-03-25 NOTE — Progress Notes (Signed)
ANTICOAGULATION CONSULT NOTE - Initial Consult  Pharmacy Consult for Heparin Indication: chest pain/ACS  No Known Allergies  Patient Measurements: Height: 5\' 9"  (175.3 cm) Weight: 159 lb 9.8 oz (72.4 kg) IBW/kg (Calculated) : 70.7 Heparin Dosing Weight: 72.4 kg  Vital Signs: Temp: 97.5 F (36.4 C) (01/28 1700) Temp Source: Axillary (01/28 1700) BP: 126/62 (01/28 1700) Pulse Rate: 76 (01/28 1700)  Labs: Recent Labs    03/25/18 0808 03/25/18 1422  HGB 9.0*  --   HCT 27.4*  --   PLT 191  --   LABPROT 14.4  --   INR 1.13  --   CREATININE 3.09*  --   TROPONINI  --  4.16*    Estimated Creatinine Clearance: 15.6 mL/min (A) (by C-G formula based on SCr of 3.09 mg/dL (H)).   Medical History: Past Medical History:  Diagnosis Date  . Anemia   . CAD of autologous bypass graft   . Carotid artery disease ([place])   . Diabetes mellitus without complication ([place])   . DJD (degenerative joint disease)   . History of anemia of chronic disease   . Hypercholesteremia   . Hypertension   . PVD (peripheral vascular disease) with claudication ([place])   . Renal insufficiency   . Wears dentures   . Wears glasses    Assessment:  85 yr old male admitted with SOB and chest discomfort.  Troponin 4.16, to begin IV heparin for ACS/ NSTEMI.   Anemia of chronic disease, Hgb 9.0 noted at baseline.  Not on anticoagulants PTA.  Goal of Therapy:  Heparin level 0.3-0.7 units/ml Monitor platelets by anticoagulation protocol: Yes   Plan:   Heparin 4000 units IV x 1.  Heparin drip to begin at 900 units/hr  Heparin level ~8 hrs after drip begins.  Daily heparin level and CBC.  Arty Baumgartner, [place] Pager: [phone] or phone: [phone] 03/25/2018,5:13 PM

## 2018-03-25 NOTE — ED Notes (Signed)
Dr.Bero made aware of pt istat troponin level. ED-Lab.

## 2018-03-25 NOTE — Plan of Care (Signed)
Patient able to get OOB with assistance to bedside commode for bowel movement earlier tonight. Patient remains on Bipap continously at this time. Patient currently NPO due to Bipap, poor PO intake reported prior to this admission as well as weight loss. Will continue to monitor.

## 2018-03-25 NOTE — ED Triage Notes (Signed)
Pt in c/o SOB and CP today, pt reports recent placement of HD fistula to L arm with slight redness present, pt 93% on RA, pt speaks in full sentences, pt denies n/v/d, pt A&O x4, pt rcvd 324 mg ASA & x1 sL Nitro pta

## 2018-03-25 NOTE — Consult Note (Signed)
Cardiology Consultation:   Patient ID: TYWAN SIEVER MRN: [id number]; DOB: 05/23/1924  Admit date: 03/25/2018 Date of Consult: 03/25/2018  Primary Care Provider: [name], MD Primary Cardiologist: Sherren Mocha, MD     Patient Profile:   Henry [name] is a 85 y.o. male with a hx of coronary artery disease status post coronary artery bypass graft, peripheral vascular disease, hypertension, hyperlipidemia, diabetes mellitus, moderate aortic stenosis who is being seen today for the evaluation of acute diastolic congestive heart failure and non-ST elevation myocardial infarction at the request of Barb Merino MD.  History of Present Illness:   Patient is status post coronary artery bypass and graft in 2000.  Details are not available.  Last echocardiogram September 2019 showed normal LV function, moderate aortic stenosis with mean gradient 22 mmHg, trace aortic insufficiency and mild mitral regurgitation.  Also with peripheral vascular disease followed by Dr. Fletcher Anon.  Patient does have dyspnea with more vigorous activities at baseline but typically no chest pain.  He also has bilateral lower extremity claudication.  Approximately 1 week ago he had an AV fistula placed in left upper extremity in anticipation of initiation of dialysis.  Yesterday morning he complained of dizziness without other neurological symptoms.  He also became short of breath and had epigastric discomfort that lasted for approximately 24 hours.  The pain did not radiate and there was no nausea or vomiting and no diaphoresis.  His chest pain has now resolved but his dyspnea persisted and he presented to the emergency room.  He is in pulmonary edema on BiPAP and cardiology asked to evaluate.  Troponin also elevated.  Past Medical History:  Diagnosis Date  . Anemia   . CAD of autologous bypass graft   . Carotid artery disease ([place])   . Diabetes mellitus without complication ([place])   . DJD (degenerative joint disease)    . History of anemia of chronic disease   . Hypercholesteremia   . Hypertension   . PVD (peripheral vascular disease) with claudication ([place])   . Renal insufficiency   . Wears dentures   . Wears glasses     Past Surgical History:  Procedure Laterality Date  . AV FISTULA PLACEMENT Left 03/20/2018   Procedure: ARTERIOVENOUS (AV) FISTULA CREATION LEFT ARM;  Surgeon: Serafina Mitchell, MD;  Location: [facility] OR;  Service: Vascular;  Laterality: Left;  . cataract surgery    . COLONOSCOPY     Hx: of  . CORONARY ARTERY BYPASS GRAFT  2000   by Dr. Cyndia Bent  . LUMBAR LAMINECTOMY/DECOMPRESSION MICRODISCECTOMY N/A 09/10/2012   Procedure: LUMBAR DECOMPRESSION,  IN SITU FUSION LUMBAR 4-5;  Surgeon: Melina Schools, MD;  Location: [facility];  Service: Orthopedics;  Laterality: N/A;  . MULTIPLE TOOTH EXTRACTIONS    . TONSILLECTOMY        Inpatient Medications: Scheduled Meds: . amLODipine  10 mg Oral QPM  . aspirin EC  81 mg Oral Daily  . atorvastatin  10 mg Oral QPM  . [START ON 03/26/2018] calcium acetate  667 mg Oral BID WC  . ferrous gluconate  325 mg Oral Daily  . furosemide  80 mg Intravenous Once  . [START ON 03/26/2018] insulin aspart  0-9 Units Subcutaneous TID WC  . [START ON 03/26/2018] levothyroxine  50 mcg Oral QAC breakfast   Continuous Infusions: . heparin 900 Units/hr (03/25/18 1735)   PRN Meds: acetaminophen **OR** acetaminophen, traMADol  Allergies:   No Known Allergies  Social History:   Social History  Socioeconomic History  . Marital status: Married    Spouse name: [name]  . Number of children: 5  . Years of education: Not on file  . Highest education level: Not on file  Occupational History  . Occupation: retired    Fish farm manager: RETIRED  Social Needs  . Financial resource strain: Not on file  . Food insecurity:    Worry: Not on file    Inability: Not on file  . Transportation needs:    Medical: Not on file    Non-medical: Not on file  Tobacco Use  . Smoking  status: Former Smoker    Packs/day: 1.00    Years: 10.00    Pack years: 10.00    Types: Cigarettes    Last attempt to quit: 02/26/1953    Years since quitting: 65.1  . Smokeless tobacco: Never Used  Substance and Sexual Activity  . Alcohol use: Not Currently    Alcohol/week: 0.0 standard drinks  . Drug use: No  . Sexual activity: Not on file  Lifestyle  . Physical activity:    Days per week: Not on file    Minutes per session: Not on file  . Stress: Not on file  Relationships  . Social connections:    Talks on phone: Not on file    Gets together: Not on file    Attends religious service: Not on file    Active member of club or organization: Not on file    Attends meetings of clubs or organizations: Not on file    Relationship status: Not on file  . Intimate partner violence:    Fear of current or ex partner: Not on file    Emotionally abused: Not on file    Physically abused: Not on file    Forced sexual activity: Not on file  Other Topics Concern  . Not on file  Social History Narrative  . Not on file    Family History:    Family History  Problem Relation Age of Onset  . Heart disease Mother   . Heart disease Father      ROS:  Please see the history of present illness.  Patient denies fevers, chills, productive cough hemoptysis.  He does have bilateral lower extremity claudication.  He has had some dizziness and weakness. All other ROS reviewed and negative.     Physical Exam/Data:   Vitals:   03/25/18 1500 03/25/18 1515 03/25/18 1651 03/25/18 1700  BP: (!) 108/94 (!) 135/93  126/62  Pulse: 76 76 76 76  Resp: (!) 21 (!) 24  (!) 27  Temp:    (!) 97.5 F (36.4 C)  TempSrc:    Axillary  SpO2: 97% 100% 100%   Weight:    72.4 kg  Height:    5\' 9"  (1.753 m)    Intake/Output Summary (Last 24 hours) at 03/25/2018 1820 Last data filed at 03/25/2018 1445 Gross per 24 hour  Intake -  Output 400 ml  Net -400 ml   Last 3 Weights 03/25/2018 03/20/2018 02/20/2018    Weight (lbs) 159 lb 9.8 oz 150 lb 152 lb  Weight (kg) 72.4 kg 68.04 kg 68.947 kg     Body mass index is 23.57 kg/m.  General:  Well nourished, somewhat frail, on BiPAP HEENT: normal Lymph: no adenopathy Neck: Supple Endocrine:  No thryomegaly Vascular: 2+ femoral pulses.  Diminished distal pulses. Cardiac:  normal S1, S2; RRR; 3/6 systolic murmur left sternal border.  S2 is preserved. Lungs: Diminished  breath sounds at bases. Abd: soft, nontender, no hepatomegaly  Ext: 1+ edema Musculoskeletal:  No deformities, BUE and BLE strength normal and equal Skin: warm and dry  Neuro:  CNs 2-12 intact, no focal abnormalities noted Psych:  Normal affect   EKG:  The EKG was personally reviewed and demonstrates:  Sinus rhythm with first-degree AV block and lateral T wave inversion. Note electrocardiogram from March 20, 2018 showed Mobitz 1 second-degree AV block with PVC. Telemetry:  Telemetry was personally reviewed and demonstrates:  Sinus   Laboratory Data:  Chemistry Recent Labs  Lab 03/20/18 0614 03/25/18 0808  NA 129*  135 136  K >8.5*  4.0 3.7  CL 104 106  CO2 23 19*  GLUCOSE 103*  108* 152*  BUN 57* 64*  CREATININE 2.44* 3.09*  CALCIUM 8.8* 9.0  GFRNONAA 22* 17*  GFRAA 26* 19*  ANIONGAP 8 11    Recent Labs  Lab 03/25/18 0808  PROT 6.6  ALBUMIN 3.6  AST 26  ALT 15  ALKPHOS 78  BILITOT 0.8   Hematology Recent Labs  Lab 03/20/18 0614 03/25/18 0808  WBC  --  10.0  RBC  --  2.96*  HGB 7.8* 9.0*  HCT 23.0* 27.4*  MCV  --  92.6  MCH  --  30.4  MCHC  --  32.8  RDW  --  13.0  PLT  --  191   Cardiac Enzymes Recent Labs  Lab 03/25/18 1422  TROPONINI 4.16*    Recent Labs  Lab 03/25/18 0824 03/25/18 1056  TROPIPOC 0.40* 1.42*    BNP Recent Labs  Lab 03/25/18 0808  BNP 669.1*    Radiology/Studies:  Dg Chest Port 1 View  Result Date: 03/25/2018 CLINICAL DATA:  Shortness of breath and chest pain. EXAM: PORTABLE CHEST 1 VIEW COMPARISON:   01/06/2018 FINDINGS: Previous median sternotomy and CABG procedure. Mild cardiac enlargement. There is diffuse pulmonary edema which is new from previous exam. Airspace consolidation within the right midlung and right base is also new. IMPRESSION: 1. Moderate pulmonary edema. 2. Right midlung and right base airspace opacities which may represent pneumonia and/or asymmetric edema. Electronically Signed   By: Kerby Moors M.D.   On: 03/25/2018 09:13   Vas [place] Lower Extremity Venous (dvt) (only [facility])  Result Date: 03/25/2018  Lower Venous Study Indications: Swelling.  Performing Technologist: Abram Sander RVS  Examination Guidelines: A complete evaluation includes B-mode imaging, spectral Doppler, color Doppler, and power Doppler as needed of all accessible portions of each vessel. Bilateral testing is considered an integral part of a complete examination. Limited examinations for reoccurring indications may be performed as noted.  Right Venous Findings: +---------+---------------+---------+-----------+----------+-------+          CompressibilityPhasicitySpontaneityPropertiesSummary +---------+---------------+---------+-----------+----------+-------+ CFV      Full           Yes      Yes                          +---------+---------------+---------+-----------+----------+-------+ SFJ      Full                                                 +---------+---------------+---------+-----------+----------+-------+ FV Prox  Full                                                 +---------+---------------+---------+-----------+----------+-------+  FV Mid   Full                                                 +---------+---------------+---------+-----------+----------+-------+ FV DistalFull                                                 +---------+---------------+---------+-----------+----------+-------+ PFV      Full                                                  +---------+---------------+---------+-----------+----------+-------+ POP      Full           Yes      Yes                          +---------+---------------+---------+-----------+----------+-------+ PTV      Full                                                 +---------+---------------+---------+-----------+----------+-------+ PERO     Full                                                 +---------+---------------+---------+-----------+----------+-------+  Left Venous Findings: +---------+---------------+---------+-----------+----------+-------+          CompressibilityPhasicitySpontaneityPropertiesSummary +---------+---------------+---------+-----------+----------+-------+ CFV      Full           Yes      Yes                          +---------+---------------+---------+-----------+----------+-------+ SFJ      Full                                                 +---------+---------------+---------+-----------+----------+-------+ FV Prox  Full                                                 +---------+---------------+---------+-----------+----------+-------+ FV Mid   Full                                                 +---------+---------------+---------+-----------+----------+-------+ FV DistalFull                                                 +---------+---------------+---------+-----------+----------+-------+  PFV      Full                                                 +---------+---------------+---------+-----------+----------+-------+ POP      Full           Yes      Yes                          +---------+---------------+---------+-----------+----------+-------+ PTV      Full                                                 +---------+---------------+---------+-----------+----------+-------+ PERO     Full                                                  +---------+---------------+---------+-----------+----------+-------+    Summary: Right: There is no evidence of deep vein thrombosis in the lower extremity. No cystic structure found in the popliteal fossa. Left: There is no evidence of deep vein thrombosis in the lower extremity. No cystic structure found in the popliteal fossa.  *See table(s) above for measurements and observations. Electronically signed by Ruta Hinds MD on 03/25/2018 at 11:55:06 AM.    Final    Ue Venous Duplex ([facility] Only)  Result Date: 03/25/2018 UPPER VENOUS STUDY  Indications: Swelling Performing Technologist: Abram Sander RVS  Examination Guidelines: A complete evaluation includes B-mode imaging, spectral Doppler, color Doppler, and power Doppler as needed of all accessible portions of each vessel. Bilateral testing is considered an integral part of a complete examination. Limited examinations for reoccurring indications may be performed as noted.  Left Findings: +----------+------------+----------+---------+-----------+-------+ LEFT      CompressiblePropertiesPhasicitySpontaneousSummary +----------+------------+----------+---------+-----------+-------+ IJV           Full                 Yes       Yes            +----------+------------+----------+---------+-----------+-------+ Subclavian    Full                 Yes       Yes            +----------+------------+----------+---------+-----------+-------+ Axillary      Full                 Yes       Yes            +----------+------------+----------+---------+-----------+-------+ Brachial      Full                 Yes       Yes            +----------+------------+----------+---------+-----------+-------+ Radial        Full                                          +----------+------------+----------+---------+-----------+-------+ Ulnar  Full                                           +----------+------------+----------+---------+-----------+-------+ Cephalic      Full                                          +----------+------------+----------+---------+-----------+-------+ Basilic       Full                                          +----------+------------+----------+---------+-----------+-------+  Summary:  Left: No evidence of deep vein thrombosis in the upper extremity. No evidence of superficial vein thrombosis in the upper extremity.  *See table(s) above for measurements and observations.  Diagnosing physician: Ruta Hinds MD Electronically signed by Ruta Hinds MD on 03/25/2018 at 11:55:14 AM.    Final     Assessment and Plan:   1. Non-ST elevation myocardial infarction-patient is pain-free.  His troponin has increased to 4.  Will treat with aspirin, heparin and statin.  We will not add a beta-blocker given baseline first-degree AV block and recent electrocardiogram showing Mobitz 1 second-degree AV block (coreg DCed by Dr Burt Knack at time [facility] 1 noted).  Difficult situation given patient's age and baseline renal insufficiency.  However an AV fistula has been placed in anticipation of initiating dialysis.  He also wants all measures.  I therefore think that when his pulmonary edema improves we should proceed with cardiac catheterization.  The risks and benefits including myocardial infarction, CVA, death and need for dialysis discussed and he would be willing to proceed. 2. Acute diastolic congestive heart failure-patient in pulmonary edema at time of admission.  Possible ischemia mediated component.  Check echocardiogram to reassess LV function.  Diurese with Lasix 80 mg IV twice daily.  Follow renal function closely. 3. History of moderate aortic stenosis-does not sound severe on examination but we will repeat echocardiogram to reassess. 4. Chronic stage V kidney disease-nephrology is now following and plans dialysis tomorrow for volume removal if he does not  respond to diuretics.  Follow renal function closely.  He understands he will likely require dialysis if catheterization performed.  However it appears that he is heading towards dialysis regardless. 5. History of severe peripheral vascular disease-continue aspirin and statin. 6. Hyperlipidemia-continue statin.  Advance to 40 mg daily given history of coronary disease. 7. Hypertension-patient's blood pressure is controlled.  Continue present medications.  No beta-blocker as outlined above.  No ACE inhibitor or ARB given renal insufficiency.    For questions or updates, please contact [place] Please consult [email or web address] for contact info under     Signed, Kirk Ruths, MD  03/25/2018 6:20 PM

## 2018-03-25 NOTE — Telephone Encounter (Signed)
New Message:      Pt's daughter wants Dr Burt Knack to know he was admitted to  [facility]. Daughter was told to contact Nurse [facility] to let the make Dr Cooper's group that he was admitted. at the vP

## 2018-03-25 NOTE — Progress Notes (Signed)
Bilateral lower extremity venous duplex and left upper extremity venous duplex has been completed.   Preliminary results in CV Proc.   Results given to RN.   Abram Sander 03/25/2018 10:12 AM

## 2018-03-25 NOTE — Progress Notes (Signed)
Upon review of orders RN noted that patient was ordered for both a one time 80mg  IV lasix and 80mg  IV lasix BID. Both orders were ordered for the same time, MD Opyd paged and asked if it was intended for patient to receive a total of 160mg  or not. MD Opyd said to give the lasix which would make it a total of 160mg  lasix. MD Opyd paged about Critical trop of 7.20, up from 4.16. In response MD Opyd wanted RN to page cardiology with update. MD Delsa Sale paged from cardiology. Will administer additional 80mg  IV lasix now and monitor patient for any changes in s/s.

## 2018-03-25 NOTE — ED Notes (Signed)
Pt returned from vascular d/t pt c/o CP, SOB, and de saturation on 3 L [place], RR team returned pt to room, EDP at bedside, Vascular on L arm completed and reported as negative, Vascular completed study on L leg in pt ED room, neg study per vascular tech, repeat EKG completed and shown to MD, fluid bolus stopped with 200 mL infused in pt, RT contacted to place pt on BIPAP per verbal order from MD, will continue to monitor

## 2018-03-25 NOTE — ED Notes (Signed)
4.16 Trop critical lab called to [place] on 2W, Josh to update MD

## 2018-03-25 NOTE — ED Provider Notes (Signed)
[facility] Emergency Department Provider Note MRN:  [id number]  Arrival date & time: 03/26/18     Chief Complaint   Chest Pain and Shortness of Breath   History of Present Illness   Henry [name] is a 85 y.o. year-old male with a history of CAD status post CABG, CKD presenting to the ED with chief complaint of chest pain or shortness of breath.  Shortness of breath began gradually yesterday afternoon, slowly progressively worsening.  Associated with dull central chest pain, which is described as mild in severity.  This morning feeling generally unwell, weak, now experiencing significant lightheadedness.  Denies headache or vision change, no numbness weakness to the arms or legs, no abdominal pain.  Patient had an AV fistula surgery a few days ago.  Review of Systems  A complete 10 system review of systems was obtained and all systems are negative except as noted in the HPI and PMH.   Patient's Health History    Past Medical History:  Diagnosis Date  . Anemia   . CAD of autologous bypass graft   . Carotid artery disease ([place])   . Diabetes mellitus without complication ([place])   . DJD (degenerative joint disease)   . History of anemia of chronic disease   . Hypercholesteremia   . Hypertension   . PVD (peripheral vascular disease) with claudication ([place])   . Renal insufficiency   . Wears dentures   . Wears glasses     Past Surgical History:  Procedure Laterality Date  . AV FISTULA PLACEMENT Left 03/20/2018   Procedure: ARTERIOVENOUS (AV) FISTULA CREATION LEFT ARM;  Surgeon: Serafina Mitchell, MD;  Location: [facility] OR;  Service: Vascular;  Laterality: Left;  . cataract surgery    . COLONOSCOPY     Hx: of  . CORONARY ARTERY BYPASS GRAFT  2000   by Dr. Cyndia Bent  . LUMBAR LAMINECTOMY/DECOMPRESSION MICRODISCECTOMY N/A 09/10/2012   Procedure: LUMBAR DECOMPRESSION,  IN SITU FUSION LUMBAR 4-5;  Surgeon: Melina Schools, MD;  Location: [facility];  Service: Orthopedics;   Laterality: N/A;  . MULTIPLE TOOTH EXTRACTIONS    . TONSILLECTOMY      Family History  Problem Relation Age of Onset  . Heart disease Mother   . Heart disease Father     Social History   Socioeconomic History  . Marital status: Married    Spouse name: [name]  . Number of children: 5  . Years of education: Not on file  . Highest education level: Not on file  Occupational History  . Occupation: retired    Fish farm manager: RETIRED  Social Needs  . Financial resource strain: Not on file  . Food insecurity:    Worry: Not on file    Inability: Not on file  . Transportation needs:    Medical: Not on file    Non-medical: Not on file  Tobacco Use  . Smoking status: Former Smoker    Packs/day: 1.00    Years: 10.00    Pack years: 10.00    Types: Cigarettes    Last attempt to quit: 02/26/1953    Years since quitting: 65.1  . Smokeless tobacco: Never Used  Substance and Sexual Activity  . Alcohol use: Not Currently    Alcohol/week: 0.0 standard drinks  . Drug use: No  . Sexual activity: Not on file  Lifestyle  . Physical activity:    Days per week: Not on file    Minutes per session: Not on file  .  Stress: Not on file  Relationships  . Social connections:    Talks on phone: Not on file    Gets together: Not on file    Attends religious service: Not on file    Active member of club or organization: Not on file    Attends meetings of clubs or organizations: Not on file    Relationship status: Not on file  . Intimate partner violence:    Fear of current or ex partner: Not on file    Emotionally abused: Not on file    Physically abused: Not on file    Forced sexual activity: Not on file  Other Topics Concern  . Not on file  Social History Narrative  . Not on file     Physical Exam  Vital Signs and Nursing Notes reviewed Vitals:   03/26/18 0420 03/26/18 0724  BP: 132/61 (!) 129/57  Pulse: 80 78  Resp: 17 18  Temp:    SpO2: 100% 100%    CONSTITUTIONAL:  Well-appearing, NAD NEURO:  Alert and oriented x 3, no focal deficits EYES:  eyes equal and reactive ENT/NECK:  no LAD, no JVD CARDIO: Regular rate, well-perfused, normal S1 and S2 PULM:  CTAB no wheezing or rhonchi; tachypneic GI/GU:  normal bowel sounds, non-distended, non-tender MSK/SPINE:  No gross deformities, 2+ pitting edema bilateral lower extremities, right greater than left SKIN:  no rash, atraumatic; well-healing surgical site to the left antecubital region, 1+ edema to the left upper extremity PSYCH:  Appropriate speech and behavior  Diagnostic and Interventional Summary    EKG Interpretation  Date/Time:  Tuesday March 25 2018 07:57:41 EST Ventricular Rate:  79 PR Interval:    QRS Duration: 108 QT Interval:  368 QTC Calculation: 422 R Axis:   43 Text Interpretation:  Sinus rhythm Atrial premature complexes Prolonged PR interval Probable left atrial enlargement LVH with secondary repolarization abnormality Anterior ST elevation, probably due to LVH Confirmed by Gerlene Fee [phone]) on 03/25/2018 8:36:16 AM      Labs Reviewed  CBC - Abnormal; Notable for the following components:      Result Value   RBC 2.96 (*)    Hemoglobin 9.0 (*)    HCT 27.4 (*)    All other components within normal limits  COMPREHENSIVE METABOLIC PANEL - Abnormal; Notable for the following components:   CO2 19 (*)    Glucose, Bld 152 (*)    BUN 64 (*)    Creatinine, Ser 3.09 (*)    GFR calc non Af Amer 17 (*)    GFR calc Af Amer 19 (*)    All other components within normal limits  BRAIN NATRIURETIC PEPTIDE - Abnormal; Notable for the following components:   B Natriuretic Peptide 669.1 (*)    All other components within normal limits  TROPONIN I - Abnormal; Notable for the following components:   Troponin I 4.16 (*)    All other components within normal limits  TROPONIN I - Abnormal; Notable for the following components:   Troponin I 7.20 (*)    All other components within normal  limits  TROPONIN I - Abnormal; Notable for the following components:   Troponin I 13.23 (*)    All other components within normal limits  GLUCOSE, CAPILLARY - Abnormal; Notable for the following components:   Glucose-Capillary 131 (*)    All other components within normal limits  CBC - Abnormal; Notable for the following components:   RBC 3.00 (*)    Hemoglobin 9.0 (*)  HCT 26.6 (*)    All other components within normal limits  BASIC METABOLIC PANEL - Abnormal; Notable for the following components:   Potassium 3.4 (*)    CO2 20 (*)    Glucose, Bld 148 (*)    BUN 64 (*)    Creatinine, Ser 2.92 (*)    GFR calc non Af Amer 18 (*)    GFR calc Af Amer 21 (*)    All other components within normal limits  GLUCOSE, CAPILLARY - Abnormal; Notable for the following components:   Glucose-Capillary 145 (*)    All other components within normal limits  GLUCOSE, CAPILLARY - Abnormal; Notable for the following components:   Glucose-Capillary 121 (*)    All other components within normal limits  I-STAT TROPONIN, ED - Abnormal; Notable for the following components:   Troponin i, poc 0.40 (*)    All other components within normal limits  I-STAT TROPONIN, ED - Abnormal; Notable for the following components:   Troponin i, poc 1.42 (*)    All other components within normal limits  CULTURE, BLOOD (ROUTINE X 2)  CULTURE, BLOOD (ROUTINE X 2)  MRSA PCR SCREENING  PROTIME-INR  HEPARIN LEVEL (UNFRACTIONATED)  HEPARIN LEVEL (UNFRACTIONATED)    US RENAL  Final Result    VAS [place] LOWER EXTREMITY VENOUS (DVT) (ONLY [facility])  Final Result    UE Venous Duplex ([facility] ONLY)  Final Result    DG Chest Port 1 View  Final Result    NM PULMONARY VENT AND PERF (V/Q Scan)    (Results Pending)    Medications  aspirin EC tablet 81 mg (81 mg Oral Not Given 03/25/18 2056)  traMADol (ULTRAM) tablet 50 mg (has no administration in time range)  amLODipine (NORVASC) tablet 10 mg (10 mg Oral Not Given  03/25/18 2056)  levothyroxine (SYNTHROID, LEVOTHROID) tablet 50 mcg (50 mcg Oral Not Given 03/26/18 0547)  calcium acetate (PHOSLO) capsule 667 mg (has no administration in time range)  ferrous gluconate (FERGON) tablet 325 mg (325 mg Oral Not Given 03/25/18 2057)  acetaminophen (TYLENOL) tablet 650 mg (has no administration in time range)    Or  acetaminophen (TYLENOL) suppository 650 mg (has no administration in time range)  insulin aspart (novoLOG) injection 0-9 Units (0 Units Subcutaneous Not Given 03/26/18 0737)  heparin ADULT infusion 100 units/mL (25000 units/250mL sodium chloride 0.45%) (900 Units/hr Intravenous Rate/Dose Verify 03/26/18 0600)  furosemide (LASIX) injection 80 mg (80 mg Intravenous Given 03/26/18 0834)  atorvastatin (LIPITOR) tablet 40 mg (has no administration in time range)  potassium chloride SA (K-DUR,KLOR-CON) CR tablet 20 mEq (has no administration in time range)  sodium chloride 0.9 % bolus 500 mL (0 mLs Intravenous Stopped 03/25/18 1000)  Levofloxacin (LEVAQUIN) IVPB 250 mg (0 mg Intravenous Stopped 03/25/18 1256)  furosemide (LASIX) injection 40 mg (40 mg Intravenous Given 03/25/18 1109)  heparin bolus via infusion 4,000 Units (4,000 Units Intravenous Bolus from Bag 03/25/18 1732)  furosemide (LASIX) injection 80 mg (80 mg Intravenous Given 03/25/18 2214)     Procedures Critical Care Critical Care Documentation Critical care time provided by me (excluding procedures): 41 minutes  Condition necessitating critical care: Hypoxic respiratory failure  Components of critical care management: reviewing of prior records, laboratory and imaging interpretation, frequent re-examination and reassessment of vital signs, administration of noninvasive positive pressure ventilation, discussion with consulting services    ED Course and Medical Decision Making  I have reviewed the triage vital signs and the nursing  notes.  Pertinent labs & imaging results that were available  during my care of the patient were reviewed by me and considered in my medical decision making (see below for details).  Concern for pulmonary embolism versus CHF versus ACS in this 85 year old male with recent AV fistula placement, shortness of breath, lower extremity edema.  Will unfortunately be unable to perform CTA imaging, VQ scan ordered.  Patient is with new oxygen requirement, 2 L to maintain saturations in the low 90s.  EKG nonspecific changes, first troponin 0.4.  Concern for fluid overloaded state, question of CHF versus worsening kidney function.  Started on BiPAP for comfort, admitted to stepdown unit for further care.  Barth Kirks. Sedonia Small, [place] [email or web address]  Final Clinical Impressions(s) / ED Diagnoses     ICD-10-CM   1. Acute respiratory failure with hypoxia (HCC) J96.01   2. SOB (shortness of breath) R06.02 DG Chest [facility] 1 View    DG Chest [place] 1 View  3. Acute kidney failure (HCC) N17.9 US RENAL    US RENAL  4. Hypervolemia, unspecified hypervolemia type E87.70     ED Discharge Orders    None         Maudie Flakes, MD 03/26/18 [phone]

## 2018-03-25 NOTE — ED Notes (Signed)
Pt unable to have VQ scan d/t pt being on BIPAP, MD aware

## 2018-03-25 NOTE — ED Notes (Signed)
Attempted blood draw, unable to collect troponin, Phlebotomy to collect labs

## 2018-03-26 ENCOUNTER — Inpatient Hospital Stay (HOSPITAL_COMMUNITY): Payer: Medicare Other

## 2018-03-26 DIAGNOSIS — E119 Type 2 diabetes mellitus without complications: Secondary | ICD-10-CM

## 2018-03-26 DIAGNOSIS — I340 Nonrheumatic mitral (valve) insufficiency: Secondary | ICD-10-CM

## 2018-03-26 LAB — ECHOCARDIOGRAM COMPLETE
Height: 69 in
Weight: 2352.75 oz

## 2018-03-26 LAB — CBC
HCT: 26.6 % — ABNORMAL LOW (ref 39.0–52.0)
Hemoglobin: 9 g/dL — ABNORMAL LOW (ref 13.0–17.0)
MCH: 30 pg (ref 26.0–34.0)
MCHC: 33.8 g/dL (ref 30.0–36.0)
MCV: 88.7 fL (ref 80.0–100.0)
Platelets: 218 10*3/uL (ref 150–400)
RBC: 3 MIL/uL — ABNORMAL LOW (ref 4.22–5.81)
RDW: 13 % (ref 11.5–15.5)
WBC: 9.4 10*3/uL (ref 4.0–10.5)
nRBC: 0 % (ref 0.0–0.2)

## 2018-03-26 LAB — BASIC METABOLIC PANEL
ANION GAP: 14 (ref 5–15)
BUN: 64 mg/dL — ABNORMAL HIGH (ref 8–23)
CO2: 20 mmol/L — ABNORMAL LOW (ref 22–32)
Calcium: 9.2 mg/dL (ref 8.9–10.3)
Chloride: 104 mmol/L (ref 98–111)
Creatinine, Ser: 2.92 mg/dL — ABNORMAL HIGH (ref 0.61–1.24)
GFR calc non Af Amer: 18 mL/min — ABNORMAL LOW (ref >60)
GFR, EST AFRICAN AMERICAN: 21 mL/min — AB (ref >60)
Glucose, Bld: 148 mg/dL — ABNORMAL HIGH (ref 70–99)
Potassium: 3.4 mmol/L — ABNORMAL LOW (ref 3.5–5.1)
Sodium: 138 mmol/L (ref 135–145)

## 2018-03-26 LAB — GLUCOSE, CAPILLARY
GLUCOSE-CAPILLARY: 127 mg/dL — AB (ref 70–99)
Glucose-Capillary: 121 mg/dL — ABNORMAL HIGH (ref 70–99)
Glucose-Capillary: 122 mg/dL — ABNORMAL HIGH (ref 70–99)
Glucose-Capillary: 157 mg/dL — ABNORMAL HIGH (ref 70–99)

## 2018-03-26 LAB — HEPARIN LEVEL (UNFRACTIONATED)
HEPARIN UNFRACTIONATED: 0.39 [IU]/mL (ref 0.30–0.70)
Heparin Unfractionated: 0.57 IU/mL (ref 0.30–0.70)

## 2018-03-26 LAB — TROPONIN I
Troponin I: 13.23 ng/mL (ref <0.03)
Troponin I: 11.79 ng/mL (ref <0.03)

## 2018-03-26 MED ORDER — TECHNETIUM TC 99M DIETHYLENETRIAME-PENTAACETIC ACID
31.0000 | Freq: Once | INTRAVENOUS | Status: AC | PRN
  Administered 2018-03-26: 31 via RESPIRATORY_TRACT

## 2018-03-26 MED ORDER — TECHNETIUM TO 99M ALBUMIN AGGREGATED
4.2000 | Freq: Once | INTRAVENOUS | Status: AC | PRN
  Administered 2018-03-26: 4.2 via INTRAVENOUS

## 2018-03-26 MED ORDER — POTASSIUM CHLORIDE CRYS ER 20 MEQ PO TBCR
20.0000 meq | EXTENDED_RELEASE_TABLET | Freq: Once | ORAL | Status: AC
  Administered 2018-03-26: 20 meq via ORAL
  Filled 2018-03-26: qty 1

## 2018-03-26 MED ORDER — ATORVASTATIN CALCIUM 40 MG PO TABS
40.0000 mg | ORAL_TABLET | Freq: Every evening | ORAL | Status: DC
  Administered 2018-03-26 – 2018-04-01 (×8): 40 mg via ORAL
  Filled 2018-03-26 (×7): qty 1

## 2018-03-26 NOTE — Progress Notes (Signed)
Progress Note  Patient Name: Henry [name] Date of Encounter: 03/26/2018  Primary Cardiologist: Sherren Mocha, MD   Subjective   No CP; dyspnea improving  Inpatient Medications    Scheduled Meds: . amLODipine  10 mg Oral QPM  . aspirin EC  81 mg Oral Daily  . atorvastatin  10 mg Oral QPM  . calcium acetate  667 mg Oral BID WC  . ferrous gluconate  325 mg Oral Daily  . furosemide  80 mg Intravenous BID  . insulin aspart  0-9 Units Subcutaneous TID WC  . levothyroxine  50 mcg Oral QAC breakfast   Continuous Infusions: . heparin 900 Units/hr (03/26/18 0600)   PRN Meds: acetaminophen **OR** acetaminophen, traMADol   Vital Signs    Vitals:   03/26/18 0329 03/26/18 0420 03/26/18 0500 03/26/18 0724  BP:  132/61  (!) 129/57  Pulse: 80 80  78  Resp: 19 17  18   Temp:      TempSrc:      SpO2: 100% 100%  100%  Weight:   66.7 kg   Height:        Intake/Output Summary (Last 24 hours) at 03/26/2018 0805 Last data filed at 03/26/2018 0658 Gross per 24 hour  Intake 110.37 ml  Output 2575 ml  Net -2464.63 ml   Last 3 Weights 03/26/2018 03/25/2018 03/20/2018  Weight (lbs) 147 lb 0.8 oz 159 lb 9.8 oz 150 lb  Weight (kg) 66.7 kg 72.4 kg 68.04 kg      Telemetry    Sinus with PVCs and 3 beats NSVT - Personally Reviewed   Physical Exam   GEN: Frail No acute distress On Bipap Neck: supple Cardiac: RRR, 3/6 systolic murmur Respiratory: mildly diminished BS bases GI: Soft, nontender, non-distended  MS: No edema Neuro:  Nonfocal  Psych: Normal affect   Labs    Chemistry Recent Labs  Lab 03/20/18 0614 03/25/18 0808 03/26/18 0227  NA 129*  135 136 138  K >8.5*  4.0 3.7 3.4*  CL 104 106 104  CO2 23 19* 20*  GLUCOSE 103*  108* 152* 148*  BUN 57* 64* 64*  CREATININE 2.44* 3.09* 2.92*  CALCIUM 8.8* 9.0 9.2  PROT  --  6.6  --   ALBUMIN  --  3.6  --   AST  --  26  --   ALT  --  15  --   ALKPHOS  --  78  --   BILITOT  --  0.8  --   GFRNONAA 22* 17* 18*    GFRAA 26* 19* 21*  ANIONGAP 8 11 14      Hematology Recent Labs  Lab 03/20/18 0614 03/25/18 0808 03/26/18 0227  WBC  --  10.0 9.4  RBC  --  2.96* 3.00*  HGB 7.8* 9.0* 9.0*  HCT 23.0* 27.4* 26.6*  MCV  --  92.6 88.7  MCH  --  30.4 30.0  MCHC  --  32.8 33.8  RDW  --  13.0 13.0  PLT  --  191 218    Cardiac Enzymes Recent Labs  Lab 03/25/18 1422 03/25/18 2022 03/26/18 0227  TROPONINI 4.16* 7.20* 13.23*    Recent Labs  Lab 03/25/18 0824 03/25/18 1056  TROPIPOC 0.40* 1.42*     BNP Recent Labs  Lab 03/25/18 0808  BNP 669.1*      Radiology    US Renal  Result Date: 03/25/2018 CLINICAL DATA:  Acute kidney failure. EXAM: RENAL / URINARY TRACT ULTRASOUND COMPLETE COMPARISON:  CT, 05/10/2017 FINDINGS: Right Kidney: Renal measurements: 8.4 x 3.9 x 4.6 cm = volume: 81 mL. Borderline increased parenchymal echogenicity. Mild diffuse renal cortical thinning. Mid to upper pole cyst measuring 1.4 x 1.2 x 1.3 cm. No other masses, no stones and no hydronephrosis. Left Kidney: Renal measurements: 10.7 x 5 x 5.5 cm = volume: 156 mL. Echogenicity within normal limits. No mass or hydronephrosis visualized. Bladder: Appears normal for degree of bladder distention. Incidental note of bilateral pleural effusions. IMPRESSION: 1. No acute findings.  No hydronephrosis. 2. Borderline increased right renal parenchymal echogenicity with renal cortical thinning, and a relative decrease in the size of the right kidney compared to the left, consistent with medical renal disease. 3. 14 mm right renal cyst. 4. Bilateral pleural effusions. Electronically Signed   By: Lajean Manes M.D.   On: 03/25/2018 20:10   Dg Chest Port 1 View  Result Date: 03/25/2018 CLINICAL DATA:  Shortness of breath and chest pain. EXAM: PORTABLE CHEST 1 VIEW COMPARISON:  01/06/2018 FINDINGS: Previous median sternotomy and CABG procedure. Mild cardiac enlargement. There is diffuse pulmonary edema which is new from previous exam.  Airspace consolidation within the right midlung and right base is also new. IMPRESSION: 1. Moderate pulmonary edema. 2. Right midlung and right base airspace opacities which may represent pneumonia and/or asymmetric edema. Electronically Signed   By: Kerby Moors M.D.   On: 03/25/2018 09:13   Vas [place] Lower Extremity Venous (dvt) (only [facility])  Result Date: 03/25/2018  Lower Venous Study Indications: Swelling.  Performing Technologist: Abram Sander RVS  Examination Guidelines: A complete evaluation includes B-mode imaging, spectral Doppler, color Doppler, and power Doppler as needed of all accessible portions of each vessel. Bilateral testing is considered an integral part of a complete examination. Limited examinations for reoccurring indications may be performed as noted.  Right Venous Findings: +---------+---------------+---------+-----------+----------+-------+          CompressibilityPhasicitySpontaneityPropertiesSummary +---------+---------------+---------+-----------+----------+-------+ CFV      Full           Yes      Yes                          +---------+---------------+---------+-----------+----------+-------+ SFJ      Full                                                 +---------+---------------+---------+-----------+----------+-------+ FV Prox  Full                                                 +---------+---------------+---------+-----------+----------+-------+ FV Mid   Full                                                 +---------+---------------+---------+-----------+----------+-------+ FV DistalFull                                                 +---------+---------------+---------+-----------+----------+-------+ PFV  Full                                                 +---------+---------------+---------+-----------+----------+-------+ POP      Full           Yes      Yes                           +---------+---------------+---------+-----------+----------+-------+ PTV      Full                                                 +---------+---------------+---------+-----------+----------+-------+ PERO     Full                                                 +---------+---------------+---------+-----------+----------+-------+  Left Venous Findings: +---------+---------------+---------+-----------+----------+-------+          CompressibilityPhasicitySpontaneityPropertiesSummary +---------+---------------+---------+-----------+----------+-------+ CFV      Full           Yes      Yes                          +---------+---------------+---------+-----------+----------+-------+ SFJ      Full                                                 +---------+---------------+---------+-----------+----------+-------+ FV Prox  Full                                                 +---------+---------------+---------+-----------+----------+-------+ FV Mid   Full                                                 +---------+---------------+---------+-----------+----------+-------+ FV DistalFull                                                 +---------+---------------+---------+-----------+----------+-------+ PFV      Full                                                 +---------+---------------+---------+-----------+----------+-------+ POP      Full           Yes      Yes                          +---------+---------------+---------+-----------+----------+-------+ PTV  Full                                                 +---------+---------------+---------+-----------+----------+-------+ PERO     Full                                                 +---------+---------------+---------+-----------+----------+-------+    Summary: Right: There is no evidence of deep vein thrombosis in the lower extremity. No cystic structure found in the popliteal  fossa. Left: There is no evidence of deep vein thrombosis in the lower extremity. No cystic structure found in the popliteal fossa.  *See table(s) above for measurements and observations. Electronically signed by Ruta Hinds MD on 03/25/2018 at 11:55:06 AM.    Final    Ue Venous Duplex ([facility] Only)  Result Date: 03/25/2018 UPPER VENOUS STUDY  Indications: Swelling Performing Technologist: Abram Sander RVS  Examination Guidelines: A complete evaluation includes B-mode imaging, spectral Doppler, color Doppler, and power Doppler as needed of all accessible portions of each vessel. Bilateral testing is considered an integral part of a complete examination. Limited examinations for reoccurring indications may be performed as noted.  Left Findings: +----------+------------+----------+---------+-----------+-------+ LEFT      CompressiblePropertiesPhasicitySpontaneousSummary +----------+------------+----------+---------+-----------+-------+ IJV           Full                 Yes       Yes            +----------+------------+----------+---------+-----------+-------+ Subclavian    Full                 Yes       Yes            +----------+------------+----------+---------+-----------+-------+ Axillary      Full                 Yes       Yes            +----------+------------+----------+---------+-----------+-------+ Brachial      Full                 Yes       Yes            +----------+------------+----------+---------+-----------+-------+ Radial        Full                                          +----------+------------+----------+---------+-----------+-------+ Ulnar         Full                                          +----------+------------+----------+---------+-----------+-------+ Cephalic      Full                                          +----------+------------+----------+---------+-----------+-------+ Basilic       Full                                           +----------+------------+----------+---------+-----------+-------+  Summary:  Left: No evidence of deep vein thrombosis in the upper extremity. No evidence of superficial vein thrombosis in the upper extremity.  *See table(s) above for measurements and observations.  Diagnosing physician: Ruta Hinds MD Electronically signed by Ruta Hinds MD on 03/25/2018 at 11:55:14 AM.    Final     Patient Profile     85 y.o. male with past medical history of coronary artery disease status post coronary artery bypass graft, peripheral vascular disease, hypertension, hyperlipidemia, diabetes mellitus, moderate aortic stenosis being evaluated for pulmonary edema and non-ST elevation myocardial infarction.  Assessment & Plan    1. Non-ST elevation myocardial infarction-patient remains pain-free this morning.  He has ruled in for a non-ST elevation myocardial infarction.  Continue aspirin, heparin and statin.  I have not added a beta-blocker as recent ECG showed Mobitz 1 second-degree AV block and his carvedilol was discontinued.  He continues with a first-degree AV block.  Patient does have chronic renal insufficiency.  However he had an AV fistula placed recently in anticipation of dialysis.  Given that dialysis is planned we will likely proceed with cardiac catheterization once his pulmonary edema improves.  He understands that cardiac catheterization will result in dialysis dependence.  We will likely proceed with cardiac catheterization either tomorrow or Thursday.  Await echocardiogram to assess LV function. 2. Acute diastolic congestive heart failure-CHF is improving.  I/O -2464.  Continue Lasix at present dose.  Follow renal function closely.   3. History of moderate aortic stenosis-aortic stenosis continues to sound moderate on examination.  Repeat echocardiogram pending.   4. Chronic stage V kidney disease-AV fistula has been placed in anticipation of dialysis.  Nephrology following.  As  outlined above he would like to proceed with cardiac catheterization and understands this will result in dialysis dependence.  However dialysis is imminent and we will therefore proceed.   5. History of severe peripheral vascular disease-continue aspirin and statin. 6. Hyperlipidemia-increase Lipitor to 40 mg daily. 7. Hypertension-patient's blood pressure is controlled.  Continue present medications.  No beta-blocker as outlined above.  No ACE inhibitor or ARB given renal insufficiency. 8. Hypokalemia-supplement  For questions or updates, please contact [place] Please consult [email or web address] for contact info under        Signed, Kirk Ruths, MD  03/26/2018, 8:05 AM

## 2018-03-26 NOTE — Progress Notes (Signed)
[facility] Progress Note    Assessment/ Plan:    85 y.o. male CKD5 with baseline creatinine in the mid to high 2's followed by Dr. Marval Regal, CASHD, DM, HLD, HTN with recent left BCF placed by Dr. 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 Burt Knack p/w worsening dyspnea and CP. His BUN/Cr was 64/3.09 in ED. Usually on Lasix 40mg  twice daily.  1. Acute on CKD5 with BL Cr 2.4-2.8 nearing dialysis and e/o volume overload by history and exam. - Continue with IV diuresis; UOP with better response than with prior home PO regimen and renal function fairly stable which is reassuring. No absolute indication for  dialysis initiation. Two of the daughters were bedside this AM. - Plan is to hold off on dialysis unless there are absolute indications (ie hyperkalemia, symptomatic volume overload refractory to diuresis). - Contrast may tip him over but if he has a major event there's risk of not even being able to offer RRT. Explained this to the daughters and they understand if he needs a cath per cards recs then that's the direction we should take. 2. NSTEMI  with prior h/o CASHD. Cards planning for cardiac cath in the next 1-2 days after he's optimized.  3. DM 4. HTN 5. Respiratory distress with vol overload on CXR and PE. 6. Anemia - will check iron panel 7. HPT - check phos and pth  Subjective:   Feeling better as far as the dyspnea. Denies f/c/n/v.  No events overnight.   Objective:   BP (!) 129/57 (BP Location: Right Arm)   Pulse 78   Temp 97.6 F (36.4 C) (Axillary)   Resp 18   Ht 5\' 9"  (1.753 m)   Wt 66.7 kg   SpO2 100%   BMI 21.72 kg/m   Intake/Output Summary (Last 24 hours) at 03/26/2018 1113 Last data filed at 03/26/2018 0962 Gross per 24 hour  Intake 110.37 ml  Output 2575 ml  Net -2464.63 ml   Weight change:   Physical Exam: GEN: NAD, A&Ox3, NCAT HEENT: No conjunctival pallor, EOMI NECK: Supple, no thyromegaly LUNGS:  Rales only at the bases CV: RRR, No M/R/G ABD: SNDNT +BS  EXT: 1+ lower extremity edema   Imaging: US Renal  Result Date: 03/25/2018 CLINICAL DATA:  Acute kidney failure. EXAM: RENAL / URINARY TRACT ULTRASOUND COMPLETE COMPARISON:  CT, 05/10/2017 FINDINGS: Right Kidney: Renal measurements: 8.4 x 3.9 x 4.6 cm = volume: 81 mL. Borderline increased parenchymal echogenicity. Mild diffuse renal cortical thinning. Mid to upper pole cyst measuring 1.4 x 1.2 x 1.3 cm. No other masses, no stones and no hydronephrosis. Left Kidney: Renal measurements: 10.7 x 5 x 5.5 cm = volume: 156 mL. Echogenicity within normal limits. No mass or hydronephrosis visualized. Bladder: Appears normal for degree of bladder distention. Incidental note of bilateral pleural effusions. IMPRESSION: 1. No acute findings.  No hydronephrosis. 2. Borderline increased right renal parenchymal echogenicity with renal cortical thinning, and a relative decrease in the size of the right kidney compared to the left, consistent with medical renal disease. 3. 14 mm right renal cyst. 4. Bilateral pleural effusions. Electronically Signed   By: Lajean Manes M.D.   On: 03/25/2018 20:10   Dg Chest Port 1 View  Result Date: 03/25/2018 CLINICAL DATA:  Shortness of breath and chest pain. EXAM: PORTABLE CHEST 1 VIEW COMPARISON:  01/06/2018 FINDINGS: Previous median sternotomy and CABG procedure. Mild cardiac enlargement. There is diffuse pulmonary edema  which is new from previous exam. Airspace consolidation within the right midlung and right base is also new. IMPRESSION: 1. Moderate pulmonary edema. 2. Right midlung and right base airspace opacities which may represent pneumonia and/or asymmetric edema. Electronically Signed   By: Kerby Moors M.D.   On: 03/25/2018 09:13   Vas [place] Lower Extremity Venous (dvt) (only [facility])  Result Date: 03/25/2018  Lower Venous Study Indications: Swelling.  Performing Technologist: Abram Sander RVS  Examination  Guidelines: A complete evaluation includes B-mode imaging, spectral Doppler, color Doppler, and power Doppler as needed of all accessible portions of each vessel. Bilateral testing is considered an integral part of a complete examination. Limited examinations for reoccurring indications may be performed as noted.  Right Venous Findings: +---------+---------------+---------+-----------+----------+-------+          CompressibilityPhasicitySpontaneityPropertiesSummary +---------+---------------+---------+-----------+----------+-------+ CFV      Full           Yes      Yes                          +---------+---------------+---------+-----------+----------+-------+ SFJ      Full                                                 +---------+---------------+---------+-----------+----------+-------+ FV Prox  Full                                                 +---------+---------------+---------+-----------+----------+-------+ FV Mid   Full                                                 +---------+---------------+---------+-----------+----------+-------+ FV DistalFull                                                 +---------+---------------+---------+-----------+----------+-------+ PFV      Full                                                 +---------+---------------+---------+-----------+----------+-------+ POP      Full           Yes      Yes                          +---------+---------------+---------+-----------+----------+-------+ PTV      Full                                                 +---------+---------------+---------+-----------+----------+-------+ PERO     Full                                                 +---------+---------------+---------+-----------+----------+-------+  Left Venous Findings: +---------+---------------+---------+-----------+----------+-------+          CompressibilityPhasicitySpontaneityPropertiesSummary  +---------+---------------+---------+-----------+----------+-------+ CFV      Full           Yes      Yes                          +---------+---------------+---------+-----------+----------+-------+ SFJ      Full                                                 +---------+---------------+---------+-----------+----------+-------+ FV Prox  Full                                                 +---------+---------------+---------+-----------+----------+-------+ FV Mid   Full                                                 +---------+---------------+---------+-----------+----------+-------+ FV DistalFull                                                 +---------+---------------+---------+-----------+----------+-------+ PFV      Full                                                 +---------+---------------+---------+-----------+----------+-------+ POP      Full           Yes      Yes                          +---------+---------------+---------+-----------+----------+-------+ PTV      Full                                                 +---------+---------------+---------+-----------+----------+-------+ PERO     Full                                                 +---------+---------------+---------+-----------+----------+-------+    Summary: Right: There is no evidence of deep vein thrombosis in the lower extremity. No cystic structure found in the popliteal fossa. Left: There is no evidence of deep vein thrombosis in the lower extremity. No cystic structure found in the popliteal fossa.  *See table(s) above for measurements and observations. Electronically signed by Ruta Hinds MD on 03/25/2018 at 11:55:06 AM.    Final    Ue Venous Duplex ([facility] Only)  Result Date: 03/25/2018 UPPER VENOUS STUDY  Indications: Swelling Performing Technologist: Abram Sander RVS  Examination Guidelines: A complete evaluation includes B-mode imaging,  spectral  Doppler, color Doppler, and power Doppler as needed of all accessible portions of each vessel. Bilateral testing is considered an integral part of a complete examination. Limited examinations for reoccurring indications may be performed as noted.  Left Findings: +----------+------------+----------+---------+-----------+-------+ LEFT      CompressiblePropertiesPhasicitySpontaneousSummary +----------+------------+----------+---------+-----------+-------+ IJV           Full                 Yes       Yes            +----------+------------+----------+---------+-----------+-------+ Subclavian    Full                 Yes       Yes            +----------+------------+----------+---------+-----------+-------+ Axillary      Full                 Yes       Yes            +----------+------------+----------+---------+-----------+-------+ Brachial      Full                 Yes       Yes            +----------+------------+----------+---------+-----------+-------+ Radial        Full                                          +----------+------------+----------+---------+-----------+-------+ Ulnar         Full                                          +----------+------------+----------+---------+-----------+-------+ Cephalic      Full                                          +----------+------------+----------+---------+-----------+-------+ Basilic       Full                                          +----------+------------+----------+---------+-----------+-------+  Summary:  Left: No evidence of deep vein thrombosis in the upper extremity. No evidence of superficial vein thrombosis in the upper extremity.  *See table(s) above for measurements and observations.  Diagnosing physician: Ruta Hinds MD Electronically signed by Ruta Hinds MD on 03/25/2018 at 11:55:14 AM.    Final     Labs: BMET Recent Labs  Lab 03/20/18 4235 03/25/18 0808 03/26/18 0227  NA  129*  135 136 138  K >8.5*  4.0 3.7 3.4*  CL 104 106 104  CO2 23 19* 20*  GLUCOSE 103*  108* 152* 148*  BUN 57* 64* 64*  CREATININE 2.44* 3.09* 2.92*  CALCIUM 8.8* 9.0 9.2   CBC Recent Labs  Lab 03/20/18 0614 03/25/18 0808 03/26/18 0227  WBC  --  10.0 9.4  HGB 7.8* 9.0* 9.0*  HCT 23.0* 27.4* 26.6*  MCV  --  92.6 88.7  PLT  --  191 218    Medications:    . amLODipine  10 mg Oral  QPM  . aspirin EC  81 mg Oral Daily  . atorvastatin  40 mg Oral QPM  . calcium acetate  667 mg Oral BID WC  . ferrous gluconate  325 mg Oral Daily  . furosemide  80 mg Intravenous BID  . insulin aspart  0-9 Units Subcutaneous TID WC  . levothyroxine  50 mcg Oral QAC breakfast      Otelia Santee, MD 03/26/2018, 11:13 AM

## 2018-03-26 NOTE — Progress Notes (Signed)
ANTICOAGULATION CONSULT NOTE   Pharmacy Consult for Heparin Indication: chest pain/ACS  No Known Allergies  Patient Measurements: Height: 5\' 9"  (175.3 cm) Weight: 147 lb 0.8 oz (66.7 kg) IBW/kg (Calculated) : 70.7 Heparin Dosing Weight: 72.4 kg  Vital Signs: Temp: 97.6 F (36.4 C) (01/29 1131) Temp Source: Oral (01/29 1131) BP: 139/58 (01/29 1131) Pulse Rate: 81 (01/29 1131)  Labs: Recent Labs    03/25/18 0808 03/25/18 1422 03/25/18 2022 03/26/18 0227 03/26/18 1143  HGB 9.0*  --   --  9.0*  --   HCT 27.4*  --   --  26.6*  --   PLT 191  --   --  218  --   LABPROT 14.4  --   --   --   --   INR 1.13  --   --   --   --   HEPARINUNFRC  --   --   --  0.39 0.57  CREATININE 3.09*  --   --  2.92*  --   TROPONINI  --  4.16* 7.20* 13.23*  --     Estimated Creatinine Clearance: 15.5 mL/min (A) (by C-G formula based on SCr of 2.92 mg/dL (H)).   Medical History: Past Medical History:  Diagnosis Date  . Anemia   . CAD of autologous bypass graft   . Carotid artery disease ([place])   . Diabetes mellitus without complication ([place])   . DJD (degenerative joint disease)   . History of anemia of chronic disease   . Hypercholesteremia   . Hypertension   . PVD (peripheral vascular disease) with claudication ([place])   . Renal insufficiency   . Wears dentures   . Wears glasses    Assessment:  85 yr old male admitted with SOB and chest discomfort.  Troponin 4.16>>7.2, thus began  IV heparin for ACS/ NSTEMI.   Anemia of chronic disease, Hgb 9.0 noted at baseline. Not on anticoagulants PTA.  Heparin level is 0.57 , remains therapeutic on heparin infusion 900 units/hr.   Hgb low/stable this morning and pltc wnl.   No bleeding reported.   Goal of Therapy:  Heparin level 0.3-0.7 units/ml Monitor platelets by anticoagulation protocol: Yes   Plan:   Cont Heparin drip at 900 units/hr Daily heparin level and CBC  Thank you for allowing pharmacy to be part of this patients care  team.  Nicole Cella, [place] Pharmacist [phone] Please check AMION for all [place] phone numbers After 10:00 PM, call [place] 03/26/2018 12:35 PM

## 2018-03-26 NOTE — Progress Notes (Signed)
Echocardiogram 2D Echocardiogram has been performed.  03/26/2018 10:40 AM Maudry Mayhew, MHA, RVT, RDCS, RDMS

## 2018-03-26 NOTE — Progress Notes (Signed)
Changed pt to 3lpm [place] from bipap, spO2 98% on 3lpm.  Will continue to monitor.

## 2018-03-26 NOTE — Progress Notes (Signed)
PROGRESS NOTE    Henry [name]  FTD:[id number] DOB: [birth year] DOA: 03/25/2018 PCP: [name], MD   Brief Narrative:  Henry [name] is a 85 y.o. male with medical history significant of chronic kidney disease stage IV, coronary artery disease, type 2 diabetes without complications currently not on treatment, hyperlipidemia, hypertension, anemia of chronic disease presents to the emergency room with 2 days of worsening shortness of breath and chest discomfort.  According to the patient, he underwent a fistula placement on his left arm for planning of hemodialysis in the near future.  He does not have any pulmonary problems.  He is not on oxygen at home.  He was feeling slightly short of breath 2 days ago, this is progressively worsened, feeling short of breath on minimal ambulation.  Denies any fever chills.  Denies any cough.  Denies any flulike symptoms.  No wheezing.  Along with shortness of breath, he had dull central chest pain, about 4 out of 10 and mostly associated heaviness.  This morning he did not feel good.  He felt weak, he feels dizzy so came to ER. ED Course: His blood pressures are stable in the ER.  Patient was found with increased respiratory rate and in respiratory distress, was a started on BiPAP and he feels somehow improved with BiPAP.  His hemoglobin is low but at his baseline.  Bicarb is 19.  BUN is 64/3.09 which is mildly elevated than his baseline.  His baseline creatinine about 2.4-2.6.  A chest x-ray showed pulmonary edema, right more than left.  His point-of-care troponin and subsequent troponin were elevated.  He is currently chest pain-free.  Duplex of the extremities were negative for DVT.  VQ scan was ordered, he is on BiPAP so unable to do it.  Patient is stabilizing on BiPAP.  He was given 40 mg of IV Lasix.  He was also given 250 mg of IV Levaquin.  Cardiology, nephrology consulted.   Assessment & Plan:   Principal Problem:   Respiratory failure with hypoxia  (HCC) Active Problems:   HYPERCHOLESTEROLEMIA   Anemia of chronic disease   Diabetes mellitus type 2, uncomplicated (HCC)   Carotid artery disease (HCC)   Hypertension   Chronic kidney disease (CKD), stage IV (severe) (HCC)   Pulmonary edema   Respiratory failure, acute (HCC)   Elevated troponin  Acute respiratory failure with hypoxia: Likely due to flash pulmonary edema. -Now off BiPAP.  On oxygen by nasal cannula.  - Patient was given 40 mg of IV Lasix in the ER.  Diuresis as per nephrology. - Patient was given a dose of Levaquin in the ER, will hold off on further antibiotics given no clear evidence of infection.   -VQ scan ordered.  NSTEMI Currently without chest pain. Patient does have history of coronary artery disease.   -Cardiology consulted.  Continue treatment with aspirin, heparin, statin. -No beta-blocker due to EKG showing Mobitz 1 second-degree AV block. -Family at this time wanting everything done.  Proceed with cardiac catheterization either tomorrow or Thursday.  Echocardiogram ordered.  Type 2 diabetes: Currently not on treatment.  Continue Accu-Cheks, insulin sliding scale.  Hypertension: Continue current medications.  Continue to monitor blood pressure closely and adjust medications as needed.  Anemia of chronic disease: Continue to monitor hemoglobin.  Acute pulmonary edema associated with acute renal failure on chronic stage V chronic kidney disease: Continue with IV diuresis.  Nephrology following. -Per nephrology hold off on dialysis unless absolute indications.  Hypokalemia -  Potassium replacement ordered.  DVT prophylaxis: IV Heparin Code Status: Full Family Communication: Discussed with daughter at bedside Disposition Plan: Home when stable  Consultants:   Cardiology  Nephrology   Procedures:  None  Antimicrobials:   levaquin  Subjective: He denies having any chest pain at this time.  Having shortness of breath.  On oxygen  by nasal cannula.  Objective: Vitals:   03/26/18 0420 03/26/18 0500 03/26/18 0724 03/26/18 1131  BP: 132/61  (!) 129/57 (!) 139/58  Pulse: 80  78 81  Resp: 17  18 20   Temp:    97.6 F (36.4 C)  TempSrc:    Oral  SpO2: 100%  100% 97%  Weight:  66.7 kg    Height:        Intake/Output Summary (Last 24 hours) at 03/26/2018 1140 Last data filed at 03/26/2018 0658 Gross per 24 hour  Intake 110.37 ml  Output 2575 ml  Net -2464.63 ml   Filed Weights   03/25/18 1700 03/26/18 0500  Weight: 72.4 kg 66.7 kg    Examination:  General exam: Appears calm and comfortable  Respiratory system: Decreased heart sounds on the left, bilateral crackles, no rhonchi or wheezing. Respiratory effort normal. Cardiovascular system: S1 & S2 heard, RRR. murmur, no rubs, gallops or clicks. No pedal edema. Gastrointestinal system: Abdomen is nondistended, soft and nontender. No organomegaly or masses felt. Normal bowel sounds heard. Central nervous system: Alert and oriented. No focal neurological deficits. Extremities: Symmetric 5 x 5 power. Skin: No rashes, lesions or ulcers Psychiatry: Judgement and insight appear normal. Mood & affect appropriate.     Data Reviewed: I have personally reviewed following labs and imaging studies  CBC: Recent Labs  Lab 03/20/18 0614 03/25/18 0808 03/26/18 0227  WBC  --  10.0 9.4  HGB 7.8* 9.0* 9.0*  HCT 23.0* 27.4* 26.6*  MCV  --  92.6 88.7  PLT  --  191 332   Basic Metabolic Panel: Recent Labs  Lab 03/20/18 0614 03/25/18 0808 03/26/18 0227  NA 129*  135 136 138  K >8.5*  4.0 3.7 3.4*  CL 104 106 104  CO2 23 19* 20*  GLUCOSE 103*  108* 152* 148*  BUN 57* 64* 64*  CREATININE 2.44* 3.09* 2.92*  CALCIUM 8.8* 9.0 9.2   GFR: Estimated Creatinine Clearance: 15.5 mL/min (A) (by C-G formula based on SCr of 2.92 mg/dL (H)). Liver Function Tests: Recent Labs  Lab 03/25/18 0808  AST 26  ALT 15  ALKPHOS 78  BILITOT 0.8  PROT 6.6  ALBUMIN 3.6    No results for input(s): LIPASE, AMYLASE in the last 168 hours. No results for input(s): AMMONIA in the last 168 hours. Coagulation Profile: Recent Labs  Lab 03/25/18 0808  INR 1.13   Cardiac Enzymes: Recent Labs  Lab 03/25/18 1422 03/25/18 2022 03/26/18 0227  TROPONINI 4.16* 7.20* 13.23*   BNP (last 3 results) No results for input(s): PROBNP in the last 8760 hours. HbA1C: No results for input(s): HGBA1C in the last 72 hours. CBG: Recent Labs  Lab 03/20/18 0556 03/20/18 0924 03/25/18 1654 03/25/18 2050 03/26/18 0718  GLUCAP 95 99 131* 145* 121*   Lipid Profile: No results for input(s): CHOL, HDL, LDLCALC, TRIG, CHOLHDL, LDLDIRECT in the last 72 hours. Thyroid Function Tests: No results for input(s): TSH, T4TOTAL, FREET4, T3FREE, THYROIDAB in the last 72 hours. Anemia Panel: No results for input(s): VITAMINB12, FOLATE, FERRITIN, TIBC, IRON, RETICCTPCT in the last 72 hours. Sepsis Labs: No results for input(s):  PROCALCITON, LATICACIDVEN in the last 168 hours.  Recent Results (from the past 240 hour(s))  Blood culture (routine x 2)     Status: None (Preliminary result)   Collection Time: 03/25/18 10:24 AM  Result Value Ref Range Status   Specimen Description BLOOD RIGHT HAND  Final   Special Requests   Final    BOTTLES DRAWN AEROBIC AND ANAEROBIC Blood Culture adequate volume   Culture   Final    NO GROWTH < 24 HOURS Performed at [facility], [address], [place], [place] [address]    Report Status PENDING  Incomplete  Blood culture (routine x 2)     Status: None (Preliminary result)   Collection Time: 03/25/18 10:29 AM  Result Value Ref Range Status   Specimen Description BLOOD RIGHT ANTECUBITAL  Final   Special Requests   Final    BOTTLES DRAWN AEROBIC AND ANAEROBIC Blood Culture adequate volume   Culture   Final    NO GROWTH < 24 HOURS Performed at [facility], [place] [address], [place], [place] [address]    Report Status PENDING   Incomplete  MRSA PCR Screening     Status: None   Collection Time: 03/25/18  6:20 PM  Result Value Ref Range Status   MRSA by PCR NEGATIVE NEGATIVE Final    Comment:        The GeneXpert MRSA Assay (FDA approved for NASAL specimens only), is one component of a comprehensive MRSA colonization surveillance program. It is not intended to diagnose MRSA infection nor to guide or monitor treatment for MRSA infections. Performed at [facility], [place] [address] [place], [place] [address]          Radiology Studies: US Renal  Result Date: 03/25/2018 CLINICAL DATA:  Acute kidney failure. EXAM: RENAL / URINARY TRACT ULTRASOUND COMPLETE COMPARISON:  CT, 05/10/2017 FINDINGS: Right Kidney: Renal measurements: 8.4 x 3.9 x 4.6 cm = volume: 81 mL. Borderline increased parenchymal echogenicity. Mild diffuse renal cortical thinning. Mid to upper pole cyst measuring 1.4 x 1.2 x 1.3 cm. No other masses, no stones and no hydronephrosis. Left Kidney: Renal measurements: 10.7 x 5 x 5.5 cm = volume: 156 mL. Echogenicity within normal limits. No mass or hydronephrosis visualized. Bladder: Appears normal for degree of bladder distention. Incidental note of bilateral pleural effusions. IMPRESSION: 1. No acute findings.  No hydronephrosis. 2. Borderline increased right renal parenchymal echogenicity with renal cortical thinning, and a relative decrease in the size of the right kidney compared to the left, consistent with medical renal disease. 3. 14 mm right renal cyst. 4. Bilateral pleural effusions. Electronically Signed   By: Lajean Manes M.D.   On: 03/25/2018 20:10   Dg Chest Port 1 View  Result Date: 03/25/2018 CLINICAL DATA:  Shortness of breath and chest pain. EXAM: PORTABLE CHEST 1 VIEW COMPARISON:  01/06/2018 FINDINGS: Previous median sternotomy and CABG procedure. Mild cardiac enlargement. There is diffuse pulmonary edema which is new from previous exam. Airspace consolidation within the right  midlung and right base is also new. IMPRESSION: 1. Moderate pulmonary edema. 2. Right midlung and right base airspace opacities which may represent pneumonia and/or asymmetric edema. Electronically Signed   By: Kerby Moors M.D.   On: 03/25/2018 09:13   Vas [place] Lower Extremity Venous (dvt) (only [facility])  Result Date: 03/25/2018  Lower Venous Study Indications: Swelling.  Performing Technologist: Abram Sander RVS  Examination Guidelines: A complete evaluation includes B-mode imaging,  spectral Doppler, color Doppler, and power Doppler as needed of all accessible portions of each vessel. Bilateral testing is considered an integral part of a complete examination. Limited examinations for reoccurring indications may be performed as noted.  Right Venous Findings: +---------+---------------+---------+-----------+----------+-------+          CompressibilityPhasicitySpontaneityPropertiesSummary +---------+---------------+---------+-----------+----------+-------+ CFV      Full           Yes      Yes                          +---------+---------------+---------+-----------+----------+-------+ SFJ      Full                                                 +---------+---------------+---------+-----------+----------+-------+ FV Prox  Full                                                 +---------+---------------+---------+-----------+----------+-------+ FV Mid   Full                                                 +---------+---------------+---------+-----------+----------+-------+ FV DistalFull                                                 +---------+---------------+---------+-----------+----------+-------+ PFV      Full                                                 +---------+---------------+---------+-----------+----------+-------+ POP      Full           Yes      Yes                          +---------+---------------+---------+-----------+----------+-------+  PTV      Full                                                 +---------+---------------+---------+-----------+----------+-------+ PERO     Full                                                 +---------+---------------+---------+-----------+----------+-------+  Left Venous Findings: +---------+---------------+---------+-----------+----------+-------+          CompressibilityPhasicitySpontaneityPropertiesSummary +---------+---------------+---------+-----------+----------+-------+ CFV      Full           Yes      Yes                          +---------+---------------+---------+-----------+----------+-------+  SFJ      Full                                                 +---------+---------------+---------+-----------+----------+-------+ FV Prox  Full                                                 +---------+---------------+---------+-----------+----------+-------+ FV Mid   Full                                                 +---------+---------------+---------+-----------+----------+-------+ FV DistalFull                                                 +---------+---------------+---------+-----------+----------+-------+ PFV      Full                                                 +---------+---------------+---------+-----------+----------+-------+ POP      Full           Yes      Yes                          +---------+---------------+---------+-----------+----------+-------+ PTV      Full                                                 +---------+---------------+---------+-----------+----------+-------+ PERO     Full                                                 +---------+---------------+---------+-----------+----------+-------+    Summary: Right: There is no evidence of deep vein thrombosis in the lower extremity. No cystic structure found in the popliteal fossa. Left: There is no evidence of deep vein thrombosis in the lower  extremity. No cystic structure found in the popliteal fossa.  *See table(s) above for measurements and observations. Electronically signed by Ruta Hinds MD on 03/25/2018 at 11:55:06 AM.    Final    Ue Venous Duplex ([facility] Only)  Result Date: 03/25/2018 UPPER VENOUS STUDY  Indications: Swelling Performing Technologist: Abram Sander RVS  Examination Guidelines: A complete evaluation includes B-mode imaging, spectral Doppler, color Doppler, and power Doppler as needed of all accessible portions of each vessel. Bilateral testing is considered an integral part of a complete examination. Limited examinations for reoccurring indications may be performed as noted.  Left Findings: +----------+------------+----------+---------+-----------+-------+ LEFT      CompressiblePropertiesPhasicitySpontaneousSummary +----------+------------+----------+---------+-----------+-------+ IJV           Full  Yes       Yes            +----------+------------+----------+---------+-----------+-------+ Subclavian    Full                 Yes       Yes            +----------+------------+----------+---------+-----------+-------+ Axillary      Full                 Yes       Yes            +----------+------------+----------+---------+-----------+-------+ Brachial      Full                 Yes       Yes            +----------+------------+----------+---------+-----------+-------+ Radial        Full                                          +----------+------------+----------+---------+-----------+-------+ Ulnar         Full                                          +----------+------------+----------+---------+-----------+-------+ Cephalic      Full                                          +----------+------------+----------+---------+-----------+-------+ Basilic       Full                                           +----------+------------+----------+---------+-----------+-------+  Summary:  Left: No evidence of deep vein thrombosis in the upper extremity. No evidence of superficial vein thrombosis in the upper extremity.  *See table(s) above for measurements and observations.  Diagnosing physician: Ruta Hinds MD Electronically signed by Ruta Hinds MD on 03/25/2018 at 11:55:14 AM.    Final         Scheduled Meds: . amLODipine  10 mg Oral QPM  . aspirin EC  81 mg Oral Daily  . atorvastatin  40 mg Oral QPM  . calcium acetate  667 mg Oral BID WC  . ferrous gluconate  325 mg Oral Daily  . furosemide  80 mg Intravenous BID  . insulin aspart  0-9 Units Subcutaneous TID WC  . levothyroxine  50 mcg Oral QAC breakfast   Continuous Infusions: . heparin 900 Units/hr (03/26/18 0600)     LOS: 1 day    Yaakov Guthrie, MD [facility] Pager on [place]  If 7PM-7AM, please contact night-coverage [email or web address] Password [email or web address] 03/26/2018, 11:40 AM

## 2018-03-26 NOTE — Progress Notes (Signed)
ANTICOAGULATION CONSULT NOTE   Pharmacy Consult for Heparin Indication: chest pain/ACS  No Known Allergies  Patient Measurements: Height: 5\' 9"  (175.3 cm) Weight: 159 lb 9.8 oz (72.4 kg) IBW/kg (Calculated) : 70.7 Heparin Dosing Weight: 72.4 kg  Vital Signs: Temp: 97.6 F (36.4 C) (01/28 2317) Temp Source: Axillary (01/28 2317) BP: 141/56 (01/28 2319) Pulse Rate: 83 (01/28 2319)  Labs: Recent Labs    03/25/18 0808 03/25/18 1422 03/25/18 2022 03/26/18 0227  HGB 9.0*  --   --  9.0*  HCT 27.4*  --   --  26.6*  PLT 191  --   --  218  LABPROT 14.4  --   --   --   INR 1.13  --   --   --   HEPARINUNFRC  --   --   --  0.39  CREATININE 3.09*  --   --   --   TROPONINI  --  4.16* 7.20*  --     Estimated Creatinine Clearance: 15.6 mL/min (A) (by C-G formula based on SCr of 3.09 mg/dL (H)).   Medical History: Past Medical History:  Diagnosis Date  . Anemia   . CAD of autologous bypass graft   . Carotid artery disease ([place])   . Diabetes mellitus without complication ([place])   . DJD (degenerative joint disease)   . History of anemia of chronic disease   . Hypercholesteremia   . Hypertension   . PVD (peripheral vascular disease) with claudication ([place])   . Renal insufficiency   . Wears dentures   . Wears glasses    Assessment:  85 yr old male admitted with SOB and chest discomfort.  Troponin 4.16>>7.2, to begin IV heparin for ACS/ NSTEMI.   Anemia of chronic disease, Hgb 9.0 noted at baseline.  Not on anticoagulants PTA.  Initial heparin level this AM is therapeutic   Goal of Therapy:  Heparin level 0.3-0.7 units/ml Monitor platelets by anticoagulation protocol: Yes   Plan:   Cont Heparin drip at 900 units/hr  Confirmatory heparin level at [place], PharmD, [place] Pharmacist Phone: [phone]

## 2018-03-27 ENCOUNTER — Encounter (HOSPITAL_COMMUNITY)
Admission: EM | Disposition: A | Discharge status: Home Health | Payer: Self-pay | Source: Home / Self Care | Attending: Internal Medicine

## 2018-03-27 DIAGNOSIS — I2510 Atherosclerotic heart disease of native coronary artery without angina pectoris: Secondary | ICD-10-CM

## 2018-03-27 DIAGNOSIS — I252 Old myocardial infarction: Secondary | ICD-10-CM

## 2018-03-27 HISTORY — PX: LEFT HEART CATH AND CORS/GRAFTS ANGIOGRAPHY: CATH118250

## 2018-03-27 LAB — GLUCOSE, CAPILLARY
GLUCOSE-CAPILLARY: 121 mg/dL — AB (ref 70–99)
Glucose-Capillary: 99 mg/dL (ref 70–99)
Glucose-Capillary: 132 mg/dL — ABNORMAL HIGH (ref 70–99)

## 2018-03-27 LAB — BASIC METABOLIC PANEL
Anion gap: 12 (ref 5–15)
BUN: 70 mg/dL — ABNORMAL HIGH (ref 8–23)
CO2: 23 mmol/L (ref 22–32)
Calcium: 8.9 mg/dL (ref 8.9–10.3)
Chloride: 102 mmol/L (ref 98–111)
Creatinine, Ser: 3.31 mg/dL — ABNORMAL HIGH (ref 0.61–1.24)
GFR calc non Af Amer: 15 mL/min — ABNORMAL LOW (ref >60)
GFR, EST AFRICAN AMERICAN: 18 mL/min — AB (ref >60)
Glucose, Bld: 118 mg/dL — ABNORMAL HIGH (ref 70–99)
Potassium: 3.2 mmol/L — ABNORMAL LOW (ref 3.5–5.1)
SODIUM: 137 mmol/L (ref 135–145)

## 2018-03-27 LAB — HEPARIN LEVEL (UNFRACTIONATED): HEPARIN UNFRACTIONATED: 0.49 [IU]/mL (ref 0.30–0.70)

## 2018-03-27 LAB — CBC
HCT: 25.9 % — ABNORMAL LOW (ref 39.0–52.0)
Hemoglobin: 8.5 g/dL — ABNORMAL LOW (ref 13.0–17.0)
MCH: 29.1 pg (ref 26.0–34.0)
MCHC: 32.8 g/dL (ref 30.0–36.0)
MCV: 88.7 fL (ref 80.0–100.0)
Platelets: 198 10*3/uL (ref 150–400)
RBC: 2.92 MIL/uL — ABNORMAL LOW (ref 4.22–5.81)
RDW: 12.9 % (ref 11.5–15.5)
WBC: 8.4 10*3/uL (ref 4.0–10.5)
nRBC: 0 % (ref 0.0–0.2)

## 2018-03-27 LAB — POCT ACTIVATED CLOTTING TIME: Activated Clotting Time: 136 seconds

## 2018-03-27 SURGERY — LEFT HEART CATH AND CORS/GRAFTS ANGIOGRAPHY
Anesthesia: LOCAL

## 2018-03-27 MED ORDER — SODIUM CHLORIDE 0.9% FLUSH
3.0000 mL | Freq: Two times a day (BID) | INTRAVENOUS | Status: DC
  Administered 2018-03-27 – 2018-03-28 (×3): 3 mL via INTRAVENOUS

## 2018-03-27 MED ORDER — LIDOCAINE HCL (PF) 1 % IJ SOLN
INTRAMUSCULAR | Status: AC
  Filled 2018-03-27: qty 30

## 2018-03-27 MED ORDER — SODIUM CHLORIDE 0.9 % IV SOLN: 250.0000 mL | INTRAVENOUS | Status: DC | PRN

## 2018-03-27 MED ORDER — SODIUM CHLORIDE 0.9 % IV SOLN
INTRAVENOUS | Status: DC
  Administered 2018-03-27: 20:00:00 via INTRAVENOUS

## 2018-03-27 MED ORDER — IOHEXOL 350 MG/ML SOLN
INTRAVENOUS | Status: DC | PRN
  Administered 2018-03-27: 20 mL via INTRA_ARTERIAL

## 2018-03-27 MED ORDER — SODIUM CHLORIDE 0.9 % IV SOLN
INTRAVENOUS | Status: DC
  Administered 2018-03-27: 10:00:00 via INTRAVENOUS

## 2018-03-27 MED ORDER — HYDRALAZINE HCL 25 MG PO TABS
25.0000 mg | ORAL_TABLET | Freq: Two times a day (BID) | ORAL | Status: DC
  Administered 2018-03-27 – 2018-04-02 (×12): 25 mg via ORAL
  Filled 2018-03-27 (×12): qty 1

## 2018-03-27 MED ORDER — SODIUM CHLORIDE 0.9% FLUSH
3.0000 mL | Freq: Two times a day (BID) | INTRAVENOUS | Status: DC
  Administered 2018-03-27: 3 mL via INTRAVENOUS

## 2018-03-27 MED ORDER — FENTANYL CITRATE (PF) 100 MCG/2ML IJ SOLN
INTRAMUSCULAR | Status: DC | PRN
  Administered 2018-03-27: 25 ug via INTRAVENOUS

## 2018-03-27 MED ORDER — FENTANYL CITRATE (PF) 100 MCG/2ML IJ SOLN
INTRAMUSCULAR | Status: AC
  Filled 2018-03-27: qty 2

## 2018-03-27 MED ORDER — HEPARIN (PORCINE) IN NACL 1000-0.9 UT/500ML-% IV SOLN
INTRAVENOUS | Status: DC | PRN
  Administered 2018-03-27 (×4): 500 mL

## 2018-03-27 MED ORDER — SODIUM CHLORIDE 0.9% FLUSH: 3.0000 mL | INTRAVENOUS | Status: DC | PRN

## 2018-03-27 MED ORDER — MIDAZOLAM HCL 2 MG/2ML IJ SOLN
INTRAMUSCULAR | Status: AC
  Filled 2018-03-27: qty 2

## 2018-03-27 MED ORDER — LIDOCAINE HCL (PF) 1 % IJ SOLN
INTRAMUSCULAR | Status: DC | PRN
  Administered 2018-03-27: 20 mL

## 2018-03-27 MED ORDER — MIDAZOLAM HCL 2 MG/2ML IJ SOLN
INTRAMUSCULAR | Status: DC | PRN
  Administered 2018-03-27: 1 mg via INTRAVENOUS

## 2018-03-27 MED ORDER — ASPIRIN 81 MG PO CHEW: 81.0000 mg | CHEWABLE_TABLET | ORAL | Status: AC

## 2018-03-27 MED ORDER — HEPARIN (PORCINE) IN NACL 1000-0.9 UT/500ML-% IV SOLN
INTRAVENOUS | Status: AC
  Filled 2018-03-27: qty 1000

## 2018-03-27 MED ORDER — ONDANSETRON HCL 4 MG/2ML IJ SOLN: 4.0000 mg | Freq: Four times a day (QID) | INTRAMUSCULAR | Status: DC | PRN

## 2018-03-27 MED ORDER — SODIUM CHLORIDE 0.9 % IV SOLN
INTRAVENOUS | Status: AC | PRN
  Administered 2018-03-27: 10 mL/h via INTRAVENOUS

## 2018-03-27 MED ORDER — POTASSIUM CHLORIDE 10 MEQ/100ML IV SOLN
10.0000 meq | INTRAVENOUS | Status: AC
  Administered 2018-03-27 (×3): 10 meq via INTRAVENOUS
  Filled 2018-03-27: qty 100

## 2018-03-27 SURGICAL SUPPLY — 9 items
CATH EXPO 5F MPA-1 (CATHETERS) ×1 IMPLANT
CATH INFINITI 5FR MULTPACK ANG (CATHETERS) ×1 IMPLANT
KIT HEART LEFT (KITS) ×2 IMPLANT
PACK CARDIAC CATHETERIZATION (CUSTOM PROCEDURE TRAY) ×2 IMPLANT
SHEATH PINNACLE 5F 10CM (SHEATH) ×2 IMPLANT
SHEATH PROBE COVER 6X72 (BAG) ×2 IMPLANT
TRANSDUCER W/STOPCOCK (MISCELLANEOUS) ×2 IMPLANT
TUBING CIL FLEX 10 FLL-RA (TUBING) ×2 IMPLANT
WIRE EMERALD 3MM-J .035X150CM (WIRE) ×2 IMPLANT

## 2018-03-27 NOTE — H&P (View-Only) (Signed)
Progress Note  Patient Name: Henry [name] Date of Encounter: 03/27/2018  Primary Cardiologist: Sherren Mocha, MD   Subjective   Off bipap; no dyspnea; no CP  Inpatient Medications    Scheduled Meds: . amLODipine  10 mg Oral QPM  . aspirin EC  81 mg Oral Daily  . atorvastatin  40 mg Oral QPM  . calcium acetate  667 mg Oral BID WC  . ferrous gluconate  325 mg Oral Daily  . insulin aspart  0-9 Units Subcutaneous TID WC  . levothyroxine  50 mcg Oral QAC breakfast   Continuous Infusions: . heparin 900 Units/hr (03/27/18 0541)  . potassium chloride     PRN Meds: acetaminophen **OR** acetaminophen, traMADol   Vital Signs    Vitals:   03/26/18 1706 03/27/18 0023 03/27/18 0300 03/27/18 0700  BP: (!) 143/57 (!) 133/55 (!) 139/51   Pulse: 89 83 81 77  Resp: (!) 24 20 20 20   Temp: 98.8 F (37.1 C) 97.8 F (36.6 C) 97.6 F (36.4 C)   TempSrc: Oral Oral Oral   SpO2: 100% 92% 96% 97%  Weight:      Height:        Intake/Output Summary (Last 24 hours) at 03/27/2018 0806 Last data filed at 03/27/2018 0700 Gross per 24 hour  Intake 1024.98 ml  Output 1750 ml  Net -725.02 ml   Last 3 Weights 03/26/2018 03/25/2018 03/20/2018  Weight (lbs) 147 lb 0.8 oz 159 lb 9.8 oz 150 lb  Weight (kg) 66.7 kg 72.4 kg 68.04 kg      Telemetry    Sinus with PVCs and intermittent Mobitz 1- Personally Reviewed   Physical Exam   GEN: NAD Neck: supple, no JVD Cardiac: RRR, 3/6 systolic murmur; no gallop Respiratory: CTA GI: Soft, NT/ND MS: No edema; AV fistula LUE Neuro:  Grossly intact   Labs    Chemistry Recent Labs  Lab 03/25/18 0808 03/26/18 0227 03/27/18 0247  NA 136 138 137  K 3.7 3.4* 3.2*  CL 106 104 102  CO2 19* 20* 23  GLUCOSE 152* 148* 118*  BUN 64* 64* 70*  CREATININE 3.09* 2.92* 3.31*  CALCIUM 9.0 9.2 8.9  PROT 6.6  --   --   ALBUMIN 3.6  --   --   AST 26  --   --   ALT 15  --   --   ALKPHOS 78  --   --   BILITOT 0.8  --   --   GFRNONAA 17* 18* 15*    GFRAA 19* 21* 18*  ANIONGAP 11 14 12      Hematology Recent Labs  Lab 03/25/18 0808 03/26/18 0227 03/27/18 0247  WBC 10.0 9.4 8.4  RBC 2.96* 3.00* 2.92*  HGB 9.0* 9.0* 8.5*  HCT 27.4* 26.6* 25.9*  MCV 92.6 88.7 88.7  MCH 30.4 30.0 29.1  MCHC 32.8 33.8 32.8  RDW 13.0 13.0 12.9  PLT 191 218 198    Cardiac Enzymes Recent Labs  Lab 03/25/18 1422 03/25/18 2022 03/26/18 0227 03/26/18 1143  TROPONINI 4.16* 7.20* 13.23* 11.79*    Recent Labs  Lab 03/25/18 0824 03/25/18 1056  TROPIPOC 0.40* 1.42*     BNP Recent Labs  Lab 03/25/18 0808  BNP 669.1*      Radiology    Dg Chest 2 View  Result Date: 03/26/2018 CLINICAL DATA:  Respiratory failure EXAM: CHEST - 2 VIEW COMPARISON:  03/25/18 FINDINGS: Cardiac shadow is stable. Postsurgical changes are again noted. Patchy infiltrative  changes are again seen in the right lung stable from the prior exam. No new focal infiltrate is seen. Small bilateral pleural effusions are noted. IMPRESSION: No change from the previous day. Electronically Signed   By: Inez Catalina M.D.   On: 03/26/2018 15:23   US Renal  Result Date: 03/25/2018 CLINICAL DATA:  Acute kidney failure. EXAM: RENAL / URINARY TRACT ULTRASOUND COMPLETE COMPARISON:  CT, 05/10/2017 FINDINGS: Right Kidney: Renal measurements: 8.4 x 3.9 x 4.6 cm = volume: 81 mL. Borderline increased parenchymal echogenicity. Mild diffuse renal cortical thinning. Mid to upper pole cyst measuring 1.4 x 1.2 x 1.3 cm. No other masses, no stones and no hydronephrosis. Left Kidney: Renal measurements: 10.7 x 5 x 5.5 cm = volume: 156 mL. Echogenicity within normal limits. No mass or hydronephrosis visualized. Bladder: Appears normal for degree of bladder distention. Incidental note of bilateral pleural effusions. IMPRESSION: 1. No acute findings.  No hydronephrosis. 2. Borderline increased right renal parenchymal echogenicity with renal cortical thinning, and a relative decrease in the size of the right  kidney compared to the left, consistent with medical renal disease. 3. 14 mm right renal cyst. 4. Bilateral pleural effusions. Electronically Signed   By: Lajean Manes M.D.   On: 03/25/2018 20:10   Nm Pulmonary Vent And Perf (v/q Scan)  Result Date: 03/26/2018 CLINICAL DATA:  Worsening short of breath. Chest discomfort. Pulmonary edema. EXAM: NUCLEAR MEDICINE VENTILATION - PERFUSION LUNG SCAN TECHNIQUE: Ventilation images were obtained in multiple projections using inhaled aerosol Tc-69m DTPA. Perfusion images were obtained in multiple projections after intravenous injection of Tc-33m MAA. RADIOPHARMACEUTICALS:  Thirty-one mCi of Tc-29m DTPA aerosol inhalation and 4.3 mCi Tc3m MAA IV COMPARISON:  None. FINDINGS: Ventilation: No focal ventilation defect. Perfusion: No wedge shaped peripheral perfusion defects to suggest acute pulmonary embolism. IMPRESSION: No evidence acute pulmonary embolism. Electronically Signed   By: Suzy Bouchard M.D.   On: 03/26/2018 15:12   Dg Chest Port 1 View  Result Date: 03/25/2018 CLINICAL DATA:  Shortness of breath and chest pain. EXAM: PORTABLE CHEST 1 VIEW COMPARISON:  01/06/2018 FINDINGS: Previous median sternotomy and CABG procedure. Mild cardiac enlargement. There is diffuse pulmonary edema which is new from previous exam. Airspace consolidation within the right midlung and right base is also new. IMPRESSION: 1. Moderate pulmonary edema. 2. Right midlung and right base airspace opacities which may represent pneumonia and/or asymmetric edema. Electronically Signed   By: Kerby Moors M.D.   On: 03/25/2018 09:13   Vas [place] Lower Extremity Venous (dvt) (only [facility])  Result Date: 03/25/2018  Lower Venous Study Indications: Swelling.  Performing Technologist: Abram Sander RVS  Examination Guidelines: A complete evaluation includes B-mode imaging, spectral Doppler, color Doppler, and power Doppler as needed of all accessible portions of each vessel. Bilateral testing is  considered an integral part of a complete examination. Limited examinations for reoccurring indications may be performed as noted.  Right Venous Findings: +---------+---------------+---------+-----------+----------+-------+          CompressibilityPhasicitySpontaneityPropertiesSummary +---------+---------------+---------+-----------+----------+-------+ CFV      Full           Yes      Yes                          +---------+---------------+---------+-----------+----------+-------+ SFJ      Full                                                 +---------+---------------+---------+-----------+----------+-------+  FV Prox  Full                                                 +---------+---------------+---------+-----------+----------+-------+ FV Mid   Full                                                 +---------+---------------+---------+-----------+----------+-------+ FV DistalFull                                                 +---------+---------------+---------+-----------+----------+-------+ PFV      Full                                                 +---------+---------------+---------+-----------+----------+-------+ POP      Full           Yes      Yes                          +---------+---------------+---------+-----------+----------+-------+ PTV      Full                                                 +---------+---------------+---------+-----------+----------+-------+ PERO     Full                                                 +---------+---------------+---------+-----------+----------+-------+  Left Venous Findings: +---------+---------------+---------+-----------+----------+-------+          CompressibilityPhasicitySpontaneityPropertiesSummary +---------+---------------+---------+-----------+----------+-------+ CFV      Full           Yes      Yes                           +---------+---------------+---------+-----------+----------+-------+ SFJ      Full                                                 +---------+---------------+---------+-----------+----------+-------+ FV Prox  Full                                                 +---------+---------------+---------+-----------+----------+-------+ FV Mid   Full                                                 +---------+---------------+---------+-----------+----------+-------+  FV DistalFull                                                 +---------+---------------+---------+-----------+----------+-------+ PFV      Full                                                 +---------+---------------+---------+-----------+----------+-------+ POP      Full           Yes      Yes                          +---------+---------------+---------+-----------+----------+-------+ PTV      Full                                                 +---------+---------------+---------+-----------+----------+-------+ PERO     Full                                                 +---------+---------------+---------+-----------+----------+-------+    Summary: Right: There is no evidence of deep vein thrombosis in the lower extremity. No cystic structure found in the popliteal fossa. Left: There is no evidence of deep vein thrombosis in the lower extremity. No cystic structure found in the popliteal fossa.  *See table(s) above for measurements and observations. Electronically signed by Ruta Hinds MD on 03/25/2018 at 11:55:06 AM.    Final    Ue Venous Duplex ([facility] Only)  Result Date: 03/25/2018 UPPER VENOUS STUDY  Indications: Swelling Performing Technologist: Abram Sander RVS  Examination Guidelines: A complete evaluation includes B-mode imaging, spectral Doppler, color Doppler, and power Doppler as needed of all accessible portions of each vessel. Bilateral testing is considered an integral part  of a complete examination. Limited examinations for reoccurring indications may be performed as noted.  Left Findings: +----------+------------+----------+---------+-----------+-------+ LEFT      CompressiblePropertiesPhasicitySpontaneousSummary +----------+------------+----------+---------+-----------+-------+ IJV           Full                 Yes       Yes            +----------+------------+----------+---------+-----------+-------+ Subclavian    Full                 Yes       Yes            +----------+------------+----------+---------+-----------+-------+ Axillary      Full                 Yes       Yes            +----------+------------+----------+---------+-----------+-------+ Brachial      Full                 Yes       Yes            +----------+------------+----------+---------+-----------+-------+ Radial  Full                                          +----------+------------+----------+---------+-----------+-------+ Ulnar         Full                                          +----------+------------+----------+---------+-----------+-------+ Cephalic      Full                                          +----------+------------+----------+---------+-----------+-------+ Basilic       Full                                          +----------+------------+----------+---------+-----------+-------+  Summary:  Left: No evidence of deep vein thrombosis in the upper extremity. No evidence of superficial vein thrombosis in the upper extremity.  *See table(s) above for measurements and observations.  Diagnosing physician: Ruta Hinds MD Electronically signed by Ruta Hinds MD on 03/25/2018 at 11:55:14 AM.    Final     Patient Profile     85 y.o. male with past medical history of coronary artery disease status post coronary artery bypass graft, peripheral vascular disease, hypertension, hyperlipidemia, diabetes mellitus, moderate aortic  stenosis being evaluated for pulmonary edema and non-ST elevation myocardial infarction.  Assessment & Plan    1. Non-ST elevation myocardial infarction-Continue aspirin, heparin and statin.  I have not added a beta-blocker as he has intermittent Mobitz 1 second-degree AV block; coreg DCed previously.  Echocardiogram shows ejection fraction 50 to 55% with inferolateral hypokinesis.  There is moderate aortic stenosis and mild aortic insufficiency, mild to moderate mitral regurgitation.  Long discussion with patient and daughter today.  He presented with non-ST elevation myocardial infarction complicated by acute pulmonary edema.  He also has chronic renal insufficiency but AV fistula has been placed and dialysis appears to be imminent.  We therefore will proceed with cardiac catheterization to see if there is a lesion amenable to PCI.  The risks and benefits including myocardial infarction, CVA, death and dialysis discussed and he agrees to proceed.   2. Acute diastolic congestive heart failure- I/O -975.  We will hold Lasix prior to catheterization and resume lower dose afterwards. 3. History of moderate aortic stenosis-moderate on echocardiogram.  Will need follow-up as an outpatient. 4. Chronic stage V kidney disease-AV fistula has been placed in anticipation of dialysis.  Nephrology following.  As outlined above he would like to proceed with cardiac catheterization and understands this will result in dialysis dependence.  However dialysis is imminent and we will therefore proceed.   5. History of severe peripheral vascular disease-continue aspirin and statin. 6. Hyperlipidemia-continue statin. 7. Hypertension-patient's blood pressure is controlled.  Continue present medications.  No beta-blocker as outlined above.  No ACE inhibitor or ARB given renal insufficiency. 8. Hypokalemia-supplement  For questions or updates, please contact [place] Please consult [email or web address] for contact info under          Signed, Kirk Ruths, MD  03/27/2018, 8:06 AM

## 2018-03-27 NOTE — Progress Notes (Signed)
Initial Nutrition Assessment  DOCUMENTATION CODES:   Not applicable  INTERVENTION:    Advance diet as medically appropriate, add supplements when able  NUTRITION DIAGNOSIS:   Increased nutrient needs related to acute illness, chronic illness as evidenced by estimated needs  GOAL:   Patient will meet greater than or equal to 90% of their needs  MONITOR:   Diet advancement, PO intake, Supplement acceptance, Labs, Skin, Weight trends, I & O's  REASON FOR ASSESSMENT:   Malnutrition Screening Tool  ASSESSMENT:   85 y.o. Male with PMH significant for CKD stage IV, CAD, DM, HTN; presented to ER with 2 days of worsening shortness of breath and chest discomfort.   1/23 s/p L brachiocephalic fistula  RD unable to obtain nutrition hx at this time. Pt not in his room; in CATH LAB.  Per nutrition screen, pt has been eating poorly PTA. Also with recent weight loss without trying. Pt is currently NPO for procedure.  Chest x-ray showed pulmonary edema, R more than L. Duplex of the extremities were negative for DVT.  Nephrology and Cariology notes reviewed.  Labs & medications reviewed. K 3.2 (L). CBG's [phone].  NUTRITION - FOCUSED PHYSICAL EXAM:    Most Recent Value  Orbital Region  Unable to assess  Upper Arm Region  Unable to assess  Thoracic and Lumbar Region  Unable to assess  Buccal Region  Unable to assess  Temple Region  Unable to assess  Clavicle Bone Region  Unable to assess  Clavicle and Acromion Bone Region  Unable to assess  Scapular Bone Region  Unable to assess  Dorsal Hand  Unable to assess  Patellar Region  Unable to assess  Anterior Thigh Region  Unable to assess  Posterior Calf Region  Unable to assess  Edema (RD Assessment)  Unable to assess     Diet Order:   Diet Order            Diet NPO time specified  Diet effective now             EDUCATION NEEDS:   Not appropriate for education at this time  Skin:  Skin Assessment: Reviewed  RN Assessment  Last BM:  1/28  Height:   Ht Readings from Last 1 Encounters:  03/25/18 5\' 9"  (1.753 m)   Weight:   Wt Readings from Last 1 Encounters:  03/26/18 66.7 kg   BMI:  Body mass index is 21.72 kg/m.  Estimated Nutritional Needs:   Kcal:  1600-1800  Protein:  80-95 gm  Fluid:  1,000 ml + UOP  Arthur Holms, RD, LDN Pager #: [phone] After-Hours Pager #: [phone]

## 2018-03-27 NOTE — Progress Notes (Signed)
Patient appears to be in no distress at this time with Sp02=99% at this time. No bipap needed

## 2018-03-27 NOTE — Progress Notes (Signed)
Pt leaves cath lab holding area in stable condition. Lt groin is unremarkable. No bleeding or hematoma. Lt groin dressing is CDI.

## 2018-03-27 NOTE — Progress Notes (Addendum)
PROGRESS NOTE    Henry [name]  WNI:[id number]  DOB: 04-May-1924  DOA: 03/25/2018 PCP: [name], MD  Brief Narrative:   85 y.o.malewith medical history significant ofchronic kidney disease stage V who has AV fistula in place per outpatient nephrology in anticipation of dialysis initiation, coronary artery disease, type 2 diabetes without complications currently not on treatment, hyperlipidemia, hypertension, anemia of chronic disease presents to the emergency room with 2 days of worsening shortness of breath and chest discomfort.Patient was found with increased respiratory rate and in respiratory distress, was a started on BiPAP.  Labs showed elevated BUN at 64 and creatinine at 3.09 (baseline around 2.4-2.6) and troponin elevation.  Chest x-ray showed pulmonary edema right greater than left.  Doppler of the lower extremities were negative.  He was given IV Lasix/IV Levaquin and admitted with cardiology/nephrology consultations.  Since admission, patient underwent VQ scan which was negative for pulmonary embolism.  Troponin peaked to 13 during the hospital course.  He has been evaluated by cardiology and is planned for cardiac cath today.  He has been managed with aspirin, statins and heparin.  Beta-blockers have been held in concern for second-degree AV block on EKGs.   Subjective:  Patient resting comfortably.  Wife and daughter bedside.  He is on 1 L O2 nasal cannula and saturating 100%.  He states his leg swellings and breathing are much improved since presentation.  He reports his dry weight to be around 145 pounds.  His systolic blood pressure appears to be around 160.  Objective: Vitals:   03/27/18 1340 03/27/18 1345 03/27/18 1350 03/27/18 1355  BP: (!) 161/55 (!) 162/64 (!) 162/48 (!) 158/50  Pulse: 80 78 78 77  Resp: 20 20 20  (!) 21  Temp:      TempSrc:      SpO2: 100% 99% 100% 99%  Weight:      Height:        Intake/Output Summary (Last 24 hours) at 03/27/2018  1402 Last data filed at 03/27/2018 1400 Gross per 24 hour  Intake 624.98 ml  Output 2000 ml  Net -1375.02 ml   Filed Weights   03/25/18 1700 03/26/18 0500  Weight: 72.4 kg 66.7 kg    Physical Examination:  General exam: Appears calm and comfortable  Respiratory system: Clear to auscultation. Respiratory effort normal. Cardiovascular system: S1 & S2 heard, RRR. No JVD, grade 3 systolic ejection murmur, rubs, gallops or clicks. No pedal edema. Gastrointestinal system: Abdomen is nondistended, soft and nontender. No organomegaly or masses felt. Normal bowel sounds heard. Central nervous system: Alert and oriented. No focal neurological deficits. Extremities: Symmetric 5 x 5 power.  AV fistula on left upper extremity Skin: No rashes, lesions or ulcers Psychiatry: Judgement and insight appear normal. Mood & affect appropriate.     Data Reviewed: I have personally reviewed following labs and imaging studies  CBC: Recent Labs  Lab 03/25/18 0808 03/26/18 0227 03/27/18 0247  WBC 10.0 9.4 8.4  HGB 9.0* 9.0* 8.5*  HCT 27.4* 26.6* 25.9*  MCV 92.6 88.7 88.7  PLT 191 218 381   Basic Metabolic Panel: Recent Labs  Lab 03/25/18 0808 03/26/18 0227 03/27/18 0247  NA 136 138 137  K 3.7 3.4* 3.2*  CL 106 104 102  CO2 19* 20* 23  GLUCOSE 152* 148* 118*  BUN 64* 64* 70*  CREATININE 3.09* 2.92* 3.31*  CALCIUM 9.0 9.2 8.9   GFR: Estimated Creatinine Clearance: 13.7 mL/min (A) (by C-G formula based on SCr of  3.31 mg/dL (H)). Liver Function Tests: Recent Labs  Lab 03/25/18 0808  AST 26  ALT 15  ALKPHOS 78  BILITOT 0.8  PROT 6.6  ALBUMIN 3.6   No results for input(s): LIPASE, AMYLASE in the last 168 hours. No results for input(s): AMMONIA in the last 168 hours. Coagulation Profile: Recent Labs  Lab 03/25/18 0808  INR 1.13   Cardiac Enzymes: Recent Labs  Lab 03/25/18 1422 03/25/18 2022 03/26/18 0227 03/26/18 1143  TROPONINI 4.16* 7.20* 13.23* 11.79*   BNP (last 3  results) No results for input(s): PROBNP in the last 8760 hours. HbA1C: No results for input(s): HGBA1C in the last 72 hours. CBG: Recent Labs  Lab 03/26/18 1134 03/26/18 1704 03/26/18 2111 03/27/18 0826 03/27/18 1350  GLUCAP 122* 127* 157* 121* 99   Lipid Profile: No results for input(s): CHOL, HDL, LDLCALC, TRIG, CHOLHDL, LDLDIRECT in the last 72 hours. Thyroid Function Tests: No results for input(s): TSH, T4TOTAL, FREET4, T3FREE, THYROIDAB in the last 72 hours. Anemia Panel: No results for input(s): VITAMINB12, FOLATE, FERRITIN, TIBC, IRON, RETICCTPCT in the last 72 hours. Sepsis Labs: No results for input(s): PROCALCITON, LATICACIDVEN in the last 168 hours.  Recent Results (from the past 240 hour(s))  Blood culture (routine x 2)     Status: None (Preliminary result)   Collection Time: 03/25/18 10:24 AM  Result Value Ref Range Status   Specimen Description BLOOD RIGHT HAND  Final   Special Requests   Final    BOTTLES DRAWN AEROBIC AND ANAEROBIC Blood Culture adequate volume   Culture   Final    NO GROWTH 2 DAYS Performed at [facility], [address] [place], [place] [address]    Report Status PENDING  Incomplete  Blood culture (routine x 2)     Status: None (Preliminary result)   Collection Time: 03/25/18 10:29 AM  Result Value Ref Range Status   Specimen Description BLOOD RIGHT ANTECUBITAL  Final   Special Requests   Final    BOTTLES DRAWN AEROBIC AND ANAEROBIC Blood Culture adequate volume   Culture   Final    NO GROWTH 2 DAYS Performed at [facility], [place] [address], [place], [place] [address]    Report Status PENDING  Incomplete  MRSA PCR Screening     Status: None   Collection Time: 03/25/18  6:20 PM  Result Value Ref Range Status   MRSA by PCR NEGATIVE NEGATIVE Final    Comment:        The GeneXpert MRSA Assay (FDA approved for NASAL specimens only), is one component of a comprehensive MRSA colonization surveillance program. It is  not intended to diagnose MRSA infection nor to guide or monitor treatment for MRSA infections. Performed at [facility], [place] [address], [place],  [address]       Radiology Studies: Dg Chest 2 View  Result Date: 03/26/2018 CLINICAL DATA:  Respiratory failure EXAM: CHEST - 2 VIEW COMPARISON:  03/25/18 FINDINGS: Cardiac shadow is stable. Postsurgical changes are again noted. Patchy infiltrative changes are again seen in the right lung stable from the prior exam. No new focal infiltrate is seen. Small bilateral pleural effusions are noted. IMPRESSION: No change from the previous day. Electronically Signed   By: Inez Catalina M.D.   On: 03/26/2018 15:23   US Renal  Result Date: 03/25/2018 CLINICAL DATA:  Acute kidney failure. EXAM: RENAL / URINARY TRACT ULTRASOUND COMPLETE COMPARISON:  CT, 05/10/2017 FINDINGS: Right Kidney: Renal measurements: 8.4  x 3.9 x 4.6 cm = volume: 81 mL. Borderline increased parenchymal echogenicity. Mild diffuse renal cortical thinning. Mid to upper pole cyst measuring 1.4 x 1.2 x 1.3 cm. No other masses, no stones and no hydronephrosis. Left Kidney: Renal measurements: 10.7 x 5 x 5.5 cm = volume: 156 mL. Echogenicity within normal limits. No mass or hydronephrosis visualized. Bladder: Appears normal for degree of bladder distention. Incidental note of bilateral pleural effusions. IMPRESSION: 1. No acute findings.  No hydronephrosis. 2. Borderline increased right renal parenchymal echogenicity with renal cortical thinning, and a relative decrease in the size of the right kidney compared to the left, consistent with medical renal disease. 3. 14 mm right renal cyst. 4. Bilateral pleural effusions. Electronically Signed   By: Lajean Manes M.D.   On: 03/25/2018 20:10   Nm Pulmonary Vent And Perf (v/q Scan)  Result Date: 03/26/2018 CLINICAL DATA:  Worsening short of breath. Chest discomfort. Pulmonary edema. EXAM: NUCLEAR MEDICINE VENTILATION - PERFUSION LUNG SCAN  TECHNIQUE: Ventilation images were obtained in multiple projections using inhaled aerosol Tc-73m DTPA. Perfusion images were obtained in multiple projections after intravenous injection of Tc-37m MAA. RADIOPHARMACEUTICALS:  Thirty-one mCi of Tc-72m DTPA aerosol inhalation and 4.3 mCi Tc62m MAA IV COMPARISON:  None. FINDINGS: Ventilation: No focal ventilation defect. Perfusion: No wedge shaped peripheral perfusion defects to suggest acute pulmonary embolism. IMPRESSION: No evidence acute pulmonary embolism. Electronically Signed   By: Suzy Bouchard M.D.   On: 03/26/2018 15:12        Scheduled Meds: . [MAR Hold] amLODipine  10 mg Oral QPM  . [MAR Hold] aspirin EC  81 mg Oral Daily  . [MAR Hold] atorvastatin  40 mg Oral QPM  . [MAR Hold] calcium acetate  667 mg Oral BID WC  . [MAR Hold] ferrous gluconate  325 mg Oral Daily  . [MAR Hold] insulin aspart  0-9 Units Subcutaneous TID WC  . [MAR Hold] levothyroxine  50 mcg Oral QAC breakfast  . sodium chloride flush  3 mL Intravenous Q12H   Continuous Infusions: . sodium chloride    . sodium chloride 10 mL/hr at 03/27/18 1027  . heparin Stopped (03/27/18 1207)    Assessment & Plan:    1.  Non-STEMI: Management per cardiology.  Continue aspirin, statins and heparin.  Beta-blockers on hold in concern for Mobitz type I second-degree AV block.  Echo shows preserved EF and moderate aortic stenosis.  Plan for cardiac cath today per cardiology.  2.Acute respiratory failure: Status post IV diuresis during the hospital course.  Present on admission secondary to problem #1, flash pulmonary edema/diastolic CHF.  Improved with diuresis.  Still on 1 L O2 which can be tapered to off.  VQ scan negative.  3.  Acute on chronic kidney disease stage V: Creatinine uptrending.  Appreciate nephrology evaluation and follow-up.  Patient nearing dialysis.  Now off IV diuretics and plan to resume oral diuretics in a.m.  Watch for worsening renal function after cardiac  cath.  May need short-term dialysis.  4.  Hypertension: On maximum Norvasc dose.  Can add hydralazine if blood pressure remains elevated.  Avoid AV blockers in concern for second-degree block. Nephrology following.  5.  Diabetes mellitus: On sliding scale insulin.  Check hemoglobin A1c  6.  Anemia of chronic disease: Hemoglobin somewhat lower today at 8.5 compared to 9 at baseline.  No signs of acute bleeding.  On heparin drip.  Monitor for now and transfuse as needed.  7.  Hypo-thyroidism: Synthroid  DVT prophylaxis: Heparin IV Code Status: Full code Family / Patient Communication: Discussed with patient and family bedside.  Answered all questions. Disposition Plan: Home when cleared by cardiology and nephrology     LOS: 2 days    Time spent: 35 minutes    Guilford Shi, MD [facility] Pager [phone]  If 7PM-7AM, please contact night-coverage [email or web address] Password [email or web address] 03/27/2018, 2:02 PM

## 2018-03-27 NOTE — Interval H&P Note (Signed)
History and Physical Interval Note:  03/27/2018 12:11 PM  Henry [name]  has presented today for surgery, with the diagnosis of UA  The various methods of treatment have been discussed with the patient and family. After consideration of risks, benefits and other options for treatment, the patient has consented to  Procedure(s): LEFT HEART CATH AND CORS/GRAFTS ANGIOGRAPHY (N/A) as a surgical intervention .  The patient's history has been reviewed, patient examined, no change in status, stable for surgery.  I have reviewed the patient's chart and labs.  Questions were answered to the patient's satisfaction.     Sherren Mocha

## 2018-03-27 NOTE — Plan of Care (Signed)

## 2018-03-27 NOTE — Progress Notes (Signed)
Site area: left femoral Site Prior to Removal:  Level 0 Pressure Applied For: 20 minutes Manual:   Yes by Evonnie Dawes RN  Patient Status During Pull:  Stable, tolerated well Post Pull Site:  Level 0 Post Pull Instructions Given: yes  Post Pull Pulses Present: faint pt palpable Dressing Applied: sterile tegaderm   Bedrest begins @ 13:35 Comments:

## 2018-03-27 NOTE — Progress Notes (Addendum)
[facility] Progress Note    Assessment/ Plan:   85 y.o.maleCKD5 with baseline creatinine in the mid to high 2's followed by Dr. Marval Regal, CASHD, DM, HLD, HTN with recent left BCF placed by Dr. 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 Burt Knack p/w worsening dyspnea and CP. His BUN/Cr was 64/3.09 in ED. Usually on Lasix 40mg  twice daily.  1. Acute on CKD5 with BL Cr 2.4-2.8 nearing dialysis and e/o volume overload by history and exam. - Let's hold on the IV diuresis and restart orals tomorrow; UOP still great but renal function going wrong direction today.   - No absolute indication for  dialysis initiation. A daughter was bedside this AM. - Plan is to hold off on dialysis unless there are absolute indications (ie hyperkalemia, symptomatic volume overload refractory to diuresis) but I'm anticipating ESRD with contrast administration. - I've spoken at length with the daughters and they understand if he needs a cath per cards recs then we will offer HD as needed. - Regardless I would not wait till the fistula is ready before a cath as the fistula will not be ready for at least 3 mths and may need intervention w/ contrast as well in the future. - CXR from yest shows infiltrate on the right side.   2. NSTEMI  with prior h/o CASHD. Cards planning for cardiac cath.  3. DM 4. HTN 5. Respiratory distress with vol overload on CXR and PE. 6. Anemia - will check iron panel 7. HPT - check phos and pth  Subjective:   Feeling better as far as the dyspnea. Denies f/c/n/v.  No events overnight.  1 daughter bedside.   Objective:   BP (!) 139/51 (BP Location: Right Arm)   Pulse 77   Temp 97.6 F (36.4 C) (Oral)   Resp 20   Ht 5\' 9"  (1.753 m)   Wt 66.7 kg   SpO2 97%   BMI 21.72 kg/m   Intake/Output Summary (Last 24 hours) at 03/27/2018 0750 Last data filed at 03/27/2018 0700 Gross per 24 hour  Intake 1024.98 ml  Output 2000 ml  Net  -975.02 ml   Weight change:   Physical Exam: GEN: NAD, alert, NCAT HEENT: No conjunctival pallor, EOMI NECK: Supple, no thyromegaly LUNGS: Rales only at the bases, B/L CV: RRR, No M/R/G ABD: SNDNT +BS  EXT: No lower extremity edema Access: LUA BCF with good bruit  Imaging: Dg Chest 2 View  Result Date: 03/26/2018 CLINICAL DATA:  Respiratory failure EXAM: CHEST - 2 VIEW COMPARISON:  03/25/18 FINDINGS: Cardiac shadow is stable. Postsurgical changes are again noted. Patchy infiltrative changes are again seen in the right lung stable from the prior exam. No new focal infiltrate is seen. Small bilateral pleural effusions are noted. IMPRESSION: No change from the previous day. Electronically Signed   By: Inez Catalina M.D.   On: 03/26/2018 15:23   US Renal  Result Date: 03/25/2018 CLINICAL DATA:  Acute kidney failure. EXAM: RENAL / URINARY TRACT ULTRASOUND COMPLETE COMPARISON:  CT, 05/10/2017 FINDINGS: Right Kidney: Renal measurements: 8.4 x 3.9 x 4.6 cm = volume: 81 mL. Borderline increased parenchymal echogenicity. Mild diffuse renal cortical thinning. Mid to upper pole cyst measuring 1.4 x 1.2 x 1.3 cm. No other masses, no stones and no hydronephrosis. Left Kidney: Renal measurements: 10.7 x 5 x 5.5 cm = volume: 156 mL. Echogenicity within normal limits. No mass or hydronephrosis visualized. Bladder: Appears normal for degree  of bladder distention. Incidental note of bilateral pleural effusions. IMPRESSION: 1. No acute findings.  No hydronephrosis. 2. Borderline increased right renal parenchymal echogenicity with renal cortical thinning, and a relative decrease in the size of the right kidney compared to the left, consistent with medical renal disease. 3. 14 mm right renal cyst. 4. Bilateral pleural effusions. Electronically Signed   By: Lajean Manes M.D.   On: 03/25/2018 20:10   Nm Pulmonary Vent And Perf (v/q Scan)  Result Date: 03/26/2018 CLINICAL DATA:  Worsening short of breath. Chest  discomfort. Pulmonary edema. EXAM: NUCLEAR MEDICINE VENTILATION - PERFUSION LUNG SCAN TECHNIQUE: Ventilation images were obtained in multiple projections using inhaled aerosol Tc-34m DTPA. Perfusion images were obtained in multiple projections after intravenous injection of Tc-31m MAA. RADIOPHARMACEUTICALS:  Thirty-one mCi of Tc-45m DTPA aerosol inhalation and 4.3 mCi Tc18m MAA IV COMPARISON:  None. FINDINGS: Ventilation: No focal ventilation defect. Perfusion: No wedge shaped peripheral perfusion defects to suggest acute pulmonary embolism. IMPRESSION: No evidence acute pulmonary embolism. Electronically Signed   By: Suzy Bouchard M.D.   On: 03/26/2018 15:12   Dg Chest Port 1 View  Result Date: 03/25/2018 CLINICAL DATA:  Shortness of breath and chest pain. EXAM: PORTABLE CHEST 1 VIEW COMPARISON:  01/06/2018 FINDINGS: Previous median sternotomy and CABG procedure. Mild cardiac enlargement. There is diffuse pulmonary edema which is new from previous exam. Airspace consolidation within the right midlung and right base is also new. IMPRESSION: 1. Moderate pulmonary edema. 2. Right midlung and right base airspace opacities which may represent pneumonia and/or asymmetric edema. Electronically Signed   By: Kerby Moors M.D.   On: 03/25/2018 09:13   Vas [place] Lower Extremity Venous (dvt) (only [facility])  Result Date: 03/25/2018  Lower Venous Study Indications: Swelling.  Performing Technologist: Abram Sander RVS  Examination Guidelines: A complete evaluation includes B-mode imaging, spectral Doppler, color Doppler, and power Doppler as needed of all accessible portions of each vessel. Bilateral testing is considered an integral part of a complete examination. Limited examinations for reoccurring indications may be performed as noted.  Right Venous Findings: +---------+---------------+---------+-----------+----------+-------+          CompressibilityPhasicitySpontaneityPropertiesSummary  +---------+---------------+---------+-----------+----------+-------+ CFV      Full           Yes      Yes                          +---------+---------------+---------+-----------+----------+-------+ SFJ      Full                                                 +---------+---------------+---------+-----------+----------+-------+ FV Prox  Full                                                 +---------+---------------+---------+-----------+----------+-------+ FV Mid   Full                                                 +---------+---------------+---------+-----------+----------+-------+ FV DistalFull                                                 +---------+---------------+---------+-----------+----------+-------+  PFV      Full                                                 +---------+---------------+---------+-----------+----------+-------+ POP      Full           Yes      Yes                          +---------+---------------+---------+-----------+----------+-------+ PTV      Full                                                 +---------+---------------+---------+-----------+----------+-------+ PERO     Full                                                 +---------+---------------+---------+-----------+----------+-------+  Left Venous Findings: +---------+---------------+---------+-----------+----------+-------+          CompressibilityPhasicitySpontaneityPropertiesSummary +---------+---------------+---------+-----------+----------+-------+ CFV      Full           Yes      Yes                          +---------+---------------+---------+-----------+----------+-------+ SFJ      Full                                                 +---------+---------------+---------+-----------+----------+-------+ FV Prox  Full                                                  +---------+---------------+---------+-----------+----------+-------+ FV Mid   Full                                                 +---------+---------------+---------+-----------+----------+-------+ FV DistalFull                                                 +---------+---------------+---------+-----------+----------+-------+ PFV      Full                                                 +---------+---------------+---------+-----------+----------+-------+ POP      Full           Yes      Yes                          +---------+---------------+---------+-----------+----------+-------+  PTV      Full                                                 +---------+---------------+---------+-----------+----------+-------+ PERO     Full                                                 +---------+---------------+---------+-----------+----------+-------+    Summary: Right: There is no evidence of deep vein thrombosis in the lower extremity. No cystic structure found in the popliteal fossa. Left: There is no evidence of deep vein thrombosis in the lower extremity. No cystic structure found in the popliteal fossa.  *See table(s) above for measurements and observations. Electronically signed by Ruta Hinds MD on 03/25/2018 at 11:55:06 AM.    Final    Ue Venous Duplex ([facility] Only)  Result Date: 03/25/2018 UPPER VENOUS STUDY  Indications: Swelling Performing Technologist: Abram Sander RVS  Examination Guidelines: A complete evaluation includes B-mode imaging, spectral Doppler, color Doppler, and power Doppler as needed of all accessible portions of each vessel. Bilateral testing is considered an integral part of a complete examination. Limited examinations for reoccurring indications may be performed as noted.  Left Findings: +----------+------------+----------+---------+-----------+-------+ LEFT      CompressiblePropertiesPhasicitySpontaneousSummary  +----------+------------+----------+---------+-----------+-------+ IJV           Full                 Yes       Yes            +----------+------------+----------+---------+-----------+-------+ Subclavian    Full                 Yes       Yes            +----------+------------+----------+---------+-----------+-------+ Axillary      Full                 Yes       Yes            +----------+------------+----------+---------+-----------+-------+ Brachial      Full                 Yes       Yes            +----------+------------+----------+---------+-----------+-------+ Radial        Full                                          +----------+------------+----------+---------+-----------+-------+ Ulnar         Full                                          +----------+------------+----------+---------+-----------+-------+ Cephalic      Full                                          +----------+------------+----------+---------+-----------+-------+ Basilic       Full                                          +----------+------------+----------+---------+-----------+-------+  Summary:  Left: No evidence of deep vein thrombosis in the upper extremity. No evidence of superficial vein thrombosis in the upper extremity.  *See table(s) above for measurements and observations.  Diagnosing physician: Ruta Hinds MD Electronically signed by Ruta Hinds MD on 03/25/2018 at 11:55:14 AM.    Final     Labs: BMET Recent Labs  Lab 03/25/18 6067 03/26/18 0227 03/27/18 0247  NA 136 138 137  K 3.7 3.4* 3.2*  CL 106 104 102  CO2 19* 20* 23  GLUCOSE 152* 148* 118*  BUN 64* 64* 70*  CREATININE 3.09* 2.92* 3.31*  CALCIUM 9.0 9.2 8.9   CBC Recent Labs  Lab 03/25/18 0808 03/26/18 0227 03/27/18 0247  WBC 10.0 9.4 8.4  HGB 9.0* 9.0* 8.5*  HCT 27.4* 26.6* 25.9*  MCV 92.6 88.7 88.7  PLT 191 218 198    Medications:    . amLODipine  10 mg Oral QPM  . aspirin  EC  81 mg Oral Daily  . atorvastatin  40 mg Oral QPM  . calcium acetate  667 mg Oral BID WC  . ferrous gluconate  325 mg Oral Daily  . furosemide  80 mg Intravenous BID  . insulin aspart  0-9 Units Subcutaneous TID WC  . levothyroxine  50 mcg Oral QAC breakfast      Otelia Santee, MD 03/27/2018, 7:50 AM

## 2018-03-27 NOTE — Progress Notes (Signed)
Progress Note  Patient Name: Henry [name] Date of Encounter: 03/27/2018  Primary Cardiologist: Sherren Mocha, MD   Subjective   Off bipap; no dyspnea; no CP  Inpatient Medications    Scheduled Meds: . amLODipine  10 mg Oral QPM  . aspirin EC  81 mg Oral Daily  . atorvastatin  40 mg Oral QPM  . calcium acetate  667 mg Oral BID WC  . ferrous gluconate  325 mg Oral Daily  . insulin aspart  0-9 Units Subcutaneous TID WC  . levothyroxine  50 mcg Oral QAC breakfast   Continuous Infusions: . heparin 900 Units/hr (03/27/18 0541)  . potassium chloride     PRN Meds: acetaminophen **OR** acetaminophen, traMADol   Vital Signs    Vitals:   03/26/18 1706 03/27/18 0023 03/27/18 0300 03/27/18 0700  BP: (!) 143/57 (!) 133/55 (!) 139/51   Pulse: 89 83 81 77  Resp: (!) 24 20 20 20   Temp: 98.8 F (37.1 C) 97.8 F (36.6 C) 97.6 F (36.4 C)   TempSrc: Oral Oral Oral   SpO2: 100% 92% 96% 97%  Weight:      Height:        Intake/Output Summary (Last 24 hours) at 03/27/2018 0806 Last data filed at 03/27/2018 0700 Gross per 24 hour  Intake 1024.98 ml  Output 1750 ml  Net -725.02 ml   Last 3 Weights 03/26/2018 03/25/2018 03/20/2018  Weight (lbs) 147 lb 0.8 oz 159 lb 9.8 oz 150 lb  Weight (kg) 66.7 kg 72.4 kg 68.04 kg      Telemetry    Sinus with PVCs and intermittent Mobitz 1- Personally Reviewed   Physical Exam   GEN: NAD Neck: supple, no JVD Cardiac: RRR, 3/6 systolic murmur; no gallop Respiratory: CTA GI: Soft, NT/ND MS: No edema; AV fistula LUE Neuro:  Grossly intact   Labs    Chemistry Recent Labs  Lab 03/25/18 0808 03/26/18 0227 03/27/18 0247  NA 136 138 137  K 3.7 3.4* 3.2*  CL 106 104 102  CO2 19* 20* 23  GLUCOSE 152* 148* 118*  BUN 64* 64* 70*  CREATININE 3.09* 2.92* 3.31*  CALCIUM 9.0 9.2 8.9  PROT 6.6  --   --   ALBUMIN 3.6  --   --   AST 26  --   --   ALT 15  --   --   ALKPHOS 78  --   --   BILITOT 0.8  --   --   GFRNONAA 17* 18* 15*    GFRAA 19* 21* 18*  ANIONGAP 11 14 12      Hematology Recent Labs  Lab 03/25/18 0808 03/26/18 0227 03/27/18 0247  WBC 10.0 9.4 8.4  RBC 2.96* 3.00* 2.92*  HGB 9.0* 9.0* 8.5*  HCT 27.4* 26.6* 25.9*  MCV 92.6 88.7 88.7  MCH 30.4 30.0 29.1  MCHC 32.8 33.8 32.8  RDW 13.0 13.0 12.9  PLT 191 218 198    Cardiac Enzymes Recent Labs  Lab 03/25/18 1422 03/25/18 2022 03/26/18 0227 03/26/18 1143  TROPONINI 4.16* 7.20* 13.23* 11.79*    Recent Labs  Lab 03/25/18 0824 03/25/18 1056  TROPIPOC 0.40* 1.42*     BNP Recent Labs  Lab 03/25/18 0808  BNP 669.1*      Radiology    Dg Chest 2 View  Result Date: 03/26/2018 CLINICAL DATA:  Respiratory failure EXAM: CHEST - 2 VIEW COMPARISON:  03/25/18 FINDINGS: Cardiac shadow is stable. Postsurgical changes are again noted. Patchy infiltrative  changes are again seen in the right lung stable from the prior exam. No new focal infiltrate is seen. Small bilateral pleural effusions are noted. IMPRESSION: No change from the previous day. Electronically Signed   By: Inez Catalina M.D.   On: 03/26/2018 15:23   US Renal  Result Date: 03/25/2018 CLINICAL DATA:  Acute kidney failure. EXAM: RENAL / URINARY TRACT ULTRASOUND COMPLETE COMPARISON:  CT, 05/10/2017 FINDINGS: Right Kidney: Renal measurements: 8.4 x 3.9 x 4.6 cm = volume: 81 mL. Borderline increased parenchymal echogenicity. Mild diffuse renal cortical thinning. Mid to upper pole cyst measuring 1.4 x 1.2 x 1.3 cm. No other masses, no stones and no hydronephrosis. Left Kidney: Renal measurements: 10.7 x 5 x 5.5 cm = volume: 156 mL. Echogenicity within normal limits. No mass or hydronephrosis visualized. Bladder: Appears normal for degree of bladder distention. Incidental note of bilateral pleural effusions. IMPRESSION: 1. No acute findings.  No hydronephrosis. 2. Borderline increased right renal parenchymal echogenicity with renal cortical thinning, and a relative decrease in the size of the right  kidney compared to the left, consistent with medical renal disease. 3. 14 mm right renal cyst. 4. Bilateral pleural effusions. Electronically Signed   By: Lajean Manes M.D.   On: 03/25/2018 20:10   Nm Pulmonary Vent And Perf (v/q Scan)  Result Date: 03/26/2018 CLINICAL DATA:  Worsening short of breath. Chest discomfort. Pulmonary edema. EXAM: NUCLEAR MEDICINE VENTILATION - PERFUSION LUNG SCAN TECHNIQUE: Ventilation images were obtained in multiple projections using inhaled aerosol Tc-26m DTPA. Perfusion images were obtained in multiple projections after intravenous injection of Tc-56m MAA. RADIOPHARMACEUTICALS:  Thirty-one mCi of Tc-34m DTPA aerosol inhalation and 4.3 mCi Tc71m MAA IV COMPARISON:  None. FINDINGS: Ventilation: No focal ventilation defect. Perfusion: No wedge shaped peripheral perfusion defects to suggest acute pulmonary embolism. IMPRESSION: No evidence acute pulmonary embolism. Electronically Signed   By: Suzy Bouchard M.D.   On: 03/26/2018 15:12   Dg Chest Port 1 View  Result Date: 03/25/2018 CLINICAL DATA:  Shortness of breath and chest pain. EXAM: PORTABLE CHEST 1 VIEW COMPARISON:  01/06/2018 FINDINGS: Previous median sternotomy and CABG procedure. Mild cardiac enlargement. There is diffuse pulmonary edema which is new from previous exam. Airspace consolidation within the right midlung and right base is also new. IMPRESSION: 1. Moderate pulmonary edema. 2. Right midlung and right base airspace opacities which may represent pneumonia and/or asymmetric edema. Electronically Signed   By: Kerby Moors M.D.   On: 03/25/2018 09:13   Vas [place] Lower Extremity Venous (dvt) (only [facility])  Result Date: 03/25/2018  Lower Venous Study Indications: Swelling.  Performing Technologist: Abram Sander RVS  Examination Guidelines: A complete evaluation includes B-mode imaging, spectral Doppler, color Doppler, and power Doppler as needed of all accessible portions of each vessel. Bilateral testing is  considered an integral part of a complete examination. Limited examinations for reoccurring indications may be performed as noted.  Right Venous Findings: +---------+---------------+---------+-----------+----------+-------+          CompressibilityPhasicitySpontaneityPropertiesSummary +---------+---------------+---------+-----------+----------+-------+ CFV      Full           Yes      Yes                          +---------+---------------+---------+-----------+----------+-------+ SFJ      Full                                                 +---------+---------------+---------+-----------+----------+-------+  FV Prox  Full                                                 +---------+---------------+---------+-----------+----------+-------+ FV Mid   Full                                                 +---------+---------------+---------+-----------+----------+-------+ FV DistalFull                                                 +---------+---------------+---------+-----------+----------+-------+ PFV      Full                                                 +---------+---------------+---------+-----------+----------+-------+ POP      Full           Yes      Yes                          +---------+---------------+---------+-----------+----------+-------+ PTV      Full                                                 +---------+---------------+---------+-----------+----------+-------+ PERO     Full                                                 +---------+---------------+---------+-----------+----------+-------+  Left Venous Findings: +---------+---------------+---------+-----------+----------+-------+          CompressibilityPhasicitySpontaneityPropertiesSummary +---------+---------------+---------+-----------+----------+-------+ CFV      Full           Yes      Yes                           +---------+---------------+---------+-----------+----------+-------+ SFJ      Full                                                 +---------+---------------+---------+-----------+----------+-------+ FV Prox  Full                                                 +---------+---------------+---------+-----------+----------+-------+ FV Mid   Full                                                 +---------+---------------+---------+-----------+----------+-------+  FV DistalFull                                                 +---------+---------------+---------+-----------+----------+-------+ PFV      Full                                                 +---------+---------------+---------+-----------+----------+-------+ POP      Full           Yes      Yes                          +---------+---------------+---------+-----------+----------+-------+ PTV      Full                                                 +---------+---------------+---------+-----------+----------+-------+ PERO     Full                                                 +---------+---------------+---------+-----------+----------+-------+    Summary: Right: There is no evidence of deep vein thrombosis in the lower extremity. No cystic structure found in the popliteal fossa. Left: There is no evidence of deep vein thrombosis in the lower extremity. No cystic structure found in the popliteal fossa.  *See table(s) above for measurements and observations. Electronically signed by Ruta Hinds MD on 03/25/2018 at 11:55:06 AM.    Final    Ue Venous Duplex ([facility] Only)  Result Date: 03/25/2018 UPPER VENOUS STUDY  Indications: Swelling Performing Technologist: Abram Sander RVS  Examination Guidelines: A complete evaluation includes B-mode imaging, spectral Doppler, color Doppler, and power Doppler as needed of all accessible portions of each vessel. Bilateral testing is considered an integral part  of a complete examination. Limited examinations for reoccurring indications may be performed as noted.  Left Findings: +----------+------------+----------+---------+-----------+-------+ LEFT      CompressiblePropertiesPhasicitySpontaneousSummary +----------+------------+----------+---------+-----------+-------+ IJV           Full                 Yes       Yes            +----------+------------+----------+---------+-----------+-------+ Subclavian    Full                 Yes       Yes            +----------+------------+----------+---------+-----------+-------+ Axillary      Full                 Yes       Yes            +----------+------------+----------+---------+-----------+-------+ Brachial      Full                 Yes       Yes            +----------+------------+----------+---------+-----------+-------+ Radial  Full                                          +----------+------------+----------+---------+-----------+-------+ Ulnar         Full                                          +----------+------------+----------+---------+-----------+-------+ Cephalic      Full                                          +----------+------------+----------+---------+-----------+-------+ Basilic       Full                                          +----------+------------+----------+---------+-----------+-------+  Summary:  Left: No evidence of deep vein thrombosis in the upper extremity. No evidence of superficial vein thrombosis in the upper extremity.  *See table(s) above for measurements and observations.  Diagnosing physician: Ruta Hinds MD Electronically signed by Ruta Hinds MD on 03/25/2018 at 11:55:14 AM.    Final     Patient Profile     85 y.o. male with past medical history of coronary artery disease status post coronary artery bypass graft, peripheral vascular disease, hypertension, hyperlipidemia, diabetes mellitus, moderate aortic  stenosis being evaluated for pulmonary edema and non-ST elevation myocardial infarction.  Assessment & Plan    1. Non-ST elevation myocardial infarction-Continue aspirin, heparin and statin.  I have not added a beta-blocker as he has intermittent Mobitz 1 second-degree AV block; coreg DCed previously.  Echocardiogram shows ejection fraction 50 to 55% with inferolateral hypokinesis.  There is moderate aortic stenosis and mild aortic insufficiency, mild to moderate mitral regurgitation.  Long discussion with patient and daughter today.  He presented with non-ST elevation myocardial infarction complicated by acute pulmonary edema.  He also has chronic renal insufficiency but AV fistula has been placed and dialysis appears to be imminent.  We therefore will proceed with cardiac catheterization to see if there is a lesion amenable to PCI.  The risks and benefits including myocardial infarction, CVA, death and dialysis discussed and he agrees to proceed.   2. Acute diastolic congestive heart failure- I/O -975.  We will hold Lasix prior to catheterization and resume lower dose afterwards. 3. History of moderate aortic stenosis-moderate on echocardiogram.  Will need follow-up as an outpatient. 4. Chronic stage V kidney disease-AV fistula has been placed in anticipation of dialysis.  Nephrology following.  As outlined above he would like to proceed with cardiac catheterization and understands this will result in dialysis dependence.  However dialysis is imminent and we will therefore proceed.   5. History of severe peripheral vascular disease-continue aspirin and statin. 6. Hyperlipidemia-continue statin. 7. Hypertension-patient's blood pressure is controlled.  Continue present medications.  No beta-blocker as outlined above.  No ACE inhibitor or ARB given renal insufficiency. 8. Hypokalemia-supplement  For questions or updates, please contact [place] Please consult [email or web address] for contact info under          Signed, Kirk Ruths, MD  03/27/2018, 8:06 AM

## 2018-03-27 NOTE — Progress Notes (Signed)
ANTICOAGULATION CONSULT NOTE   Pharmacy Consult for Heparin Indication: chest pain/ACS  No Known Allergies  Patient Measurements: Height: 5\' 9"  (175.3 cm) Weight: 147 lb 0.8 oz (Henry.7 kg) IBW/kg (Calculated) : 70.7 Heparin Dosing Weight: 72.4 kg  Vital Signs: Temp: 98.8 F (37.1 C) (01/30 0830) Temp Source: Oral (01/30 0830) BP: 140/56 (01/30 0830) Pulse Rate: 80 (01/30 0830)  Labs: Recent Labs    03/25/18 0808  03/25/18 2022 03/26/18 0227 03/26/18 1143 03/27/18 0247  HGB 9.0*  --   --  9.0*  --  8.5*  HCT 27.4*  --   --  26.6*  --  25.9*  PLT 191  --   --  218  --  198  LABPROT 14.4  --   --   --   --   --   INR 1.13  --   --   --   --   --   HEPARINUNFRC  --   --   --  0.39 0.57 0.49  CREATININE 3.09*  --   --  2.92*  --  3.31*  TROPONINI  --    < > 7.20* 13.23* 11.79*  --    < > = values in this interval not displayed.    Estimated Creatinine Clearance: 13.7 mL/min (A) (by C-G formula based on SCr of 3.31 mg/dL (H)).   Medical History: Past Medical History:  Diagnosis Date  . Anemia   . CAD of autologous bypass graft   . Carotid artery disease ([place])   . Diabetes mellitus without complication ([place])   . DJD (degenerative joint disease)   . History of anemia of chronic disease   . Hypercholesteremia   . Hypertension   . PVD (peripheral vascular disease) with claudication ([place])   . Renal insufficiency   . Wears dentures   . Wears glasses    Assessment:  85 yr old [name] admitted with SOB and chest discomfort.  Troponin 4.16>>7.2, thus began  IV heparin for ACS/ NSTEMI.   Anemia of chronic disease,  Baseline Hgb ~9.0. Not on anticoagulants PTA.  Heparin level today is 0.49, remains therapeutic on heparin infusion 900 units/hr.   Hgb low/stable this morning and pltc wnl.   No bleeding reported.   Goal of Therapy:  Heparin level 0.3-0.7 units/ml Monitor platelets by anticoagulation protocol: Yes   Plan:  Continue Heparin drip at 900 units/hr Daily heparin  level and CBC F/u after cardiac cath -?today.  Thank you for allowing pharmacy to be part of this patients care team.  Nicole Cella, [place] Pharmacist [phone] Please check AMION for all [place] phone numbers After 10:00 PM, call [place] [phone] 03/27/2018 9:15 AM

## 2018-03-28 ENCOUNTER — Encounter (HOSPITAL_COMMUNITY): Payer: Self-pay | Admitting: Cardiovascular Disease

## 2018-03-28 ENCOUNTER — Inpatient Hospital Stay (HOSPITAL_COMMUNITY): Payer: Medicare Other

## 2018-03-28 DIAGNOSIS — N179 Acute kidney failure, unspecified: Secondary | ICD-10-CM

## 2018-03-28 LAB — CBC
HCT: 23.9 % — ABNORMAL LOW (ref 39.0–52.0)
Hemoglobin: 8.2 g/dL — ABNORMAL LOW (ref 13.0–17.0)
MCH: 30.5 pg (ref 26.0–34.0)
MCHC: 34.3 g/dL (ref 30.0–36.0)
MCV: 88.8 fL (ref 80.0–100.0)
NRBC: 0 % (ref 0.0–0.2)
Platelets: 196 10*3/uL (ref 150–400)
RBC: 2.69 MIL/uL — ABNORMAL LOW (ref 4.22–5.81)
RDW: 12.9 % (ref 11.5–15.5)
WBC: 7.6 10*3/uL (ref 4.0–10.5)

## 2018-03-28 LAB — BASIC METABOLIC PANEL
Anion gap: 10 (ref 5–15)
BUN: 65 mg/dL — ABNORMAL HIGH (ref 8–23)
CHLORIDE: 101 mmol/L (ref 98–111)
CO2: 25 mmol/L (ref 22–32)
Calcium: 8.8 mg/dL — ABNORMAL LOW (ref 8.9–10.3)
Creatinine, Ser: 3.13 mg/dL — ABNORMAL HIGH (ref 0.61–1.24)
GFR calc Af Amer: 19 mL/min — ABNORMAL LOW (ref >60)
GFR calc non Af Amer: 16 mL/min — ABNORMAL LOW (ref >60)
Glucose, Bld: 118 mg/dL — ABNORMAL HIGH (ref 70–99)
Potassium: 3.6 mmol/L (ref 3.5–5.1)
Sodium: 136 mmol/L (ref 135–145)

## 2018-03-28 LAB — HEMOGLOBIN A1C
Hgb A1c MFr Bld: 6.1 % — ABNORMAL HIGH (ref 4.8–5.6)
Mean Plasma Glucose: 128.37 mg/dL

## 2018-03-28 LAB — GLUCOSE, CAPILLARY
Glucose-Capillary: 116 mg/dL — ABNORMAL HIGH (ref 70–99)
Glucose-Capillary: 166 mg/dL — ABNORMAL HIGH (ref 70–99)
Glucose-Capillary: 110 mg/dL — ABNORMAL HIGH (ref 70–99)
Glucose-Capillary: 138 mg/dL — ABNORMAL HIGH (ref 70–99)

## 2018-03-28 LAB — PHOSPHORUS: Phosphorus: 3.2 mg/dL (ref 2.5–4.6)

## 2018-03-28 LAB — MAGNESIUM: MAGNESIUM: 2 mg/dL (ref 1.7–2.4)

## 2018-03-28 MED ORDER — FUROSEMIDE 10 MG/ML IJ SOLN: 80.0000 mg | Freq: Once | INTRAMUSCULAR | Status: DC

## 2018-03-28 MED ORDER — CLOPIDOGREL BISULFATE 75 MG PO TABS
75.0000 mg | ORAL_TABLET | Freq: Every day | ORAL | Status: DC
  Administered 2018-03-28 – 2018-04-02 (×6): 75 mg via ORAL
  Filled 2018-03-28 (×6): qty 1

## 2018-03-28 MED ORDER — ACETAMINOPHEN 325 MG PO TABS: 650.0000 mg | ORAL_TABLET | Freq: Four times a day (QID) | ORAL | Status: DC | PRN

## 2018-03-28 NOTE — Care Management Important Message (Signed)
Important Message  Patient Details  Name: Henry [name] MRN: [id number] Date of Birth: [birth year]   Medicare Important Message Given:  Yes    [name] P [name] 03/28/2018, 3:01 PM

## 2018-03-28 NOTE — Progress Notes (Signed)
Patient currently on [facility] with sats of 99%. All vitals are stable. BIPAP is not needed at this time. Will continue to monitor.

## 2018-03-28 NOTE — Progress Notes (Signed)
[place] TEAM 1 - Stepdown/ICU TEAM  Henry [name]  JQB:[id number] DOB: 16-Feb-1925 DOA: 03/25/2018 PCP: [name], MD    Brief Narrative:  85 y.o.malew/ a hx of CKD stage V who has AV fistula in place in anticipation of dialysis initiation, CAD, DM2, HLD, HTN, and anemia of chronic disease who presented with 2 days of worsening shortness of breath and chest discomfort.  Chest x-ray showed pulmonary edema right greater than left.  Doppler of the lower extremities were negative.  He was given IV Lasix/IV Levaquin and admitted with cardiology/nephrology consultations.  Since admission, patient underwent VQ scan which was negative for pulmonary embolism.  Troponin peaked to 13 during the hospital course.    Subjective: Desaturated to a signif extent w/ ambulation today (76%) on RA. Denies cp, n/v, abdom pain. Has not yet been up and out of bed to a signif extent.   Assessment & Plan:  NSTEMI Management per cardiology w/ plan for medical tx only - Plavix added to ASA and statin - no BB due to AVB - cath 1/30 noted the following: Severe native 3 vessel disease with total occlusion of the RCA and LAD, severe stenosis of the left circumflex, S/P remote CABG with continued patency of the LIMA-LAD and SVG-diagonal, total occlusion of the SVG-RCA and SVG-PDA  Acute respiratory failure - Diastolic CHF exacerbation - Pulmonary edema  Status post IV diuresis - diuretic being held due to climbing crt - f/u pending CXR today - no signif peripheral edema on exam today - net negative ~3.7L  Filed Weights   03/25/18 1700 03/26/18 0500 03/28/18 0327  Weight: 72.4 kg 66.7 kg 65 kg    Mod AoS Stable per TTE - f/u w/ Cards as outpt   Acute on CKD stage V patient nearing dialysis - care per Nephrology - crt slightly improved today  Recent Labs  Lab 03/25/18 0808 03/26/18 0227 03/27/18 0247 03/28/18 0517  CREATININE 3.09* 2.92* 3.31* 3.13*     Hypertension BP stable presently    Diabetes mellitus CBG well controlled - A1c 6.1  Anemia of chronic disease Hgb holding steady - transfuse for Hgb < 8.0  Hypothyroidism Cont Synthroid  DVT prophylaxis:  Code Status: FULL CODE Family Communication: spoke w/ daughter at bedside  Disposition Plan:   Consultants:  Cardiology  Antimicrobials:  None    Objective: Blood pressure (!) 131/52, pulse 82, temperature 98 F (36.7 C), temperature source Oral, resp. rate 18, height 5\' 9"  (1.753 m), weight 65 kg, SpO2 94 %.  Intake/Output Summary (Last 24 hours) at 03/28/2018 1625 Last data filed at 03/28/2018 0626 Gross per 24 hour  Intake 676.41 ml  Output 325 ml  Net 351.41 ml   Filed Weights   03/25/18 1700 03/26/18 0500 03/28/18 0327  Weight: 72.4 kg 66.7 kg 65 kg    Examination: General: No acute respiratory distress at rest  Lungs: mild bibasilar crackles - no wheezing  Cardiovascular: RRR - 2/6 holosystolic M - no rub  Abdomen: Nontender, nondistended, soft, bowel sounds positive, no rebound, no ascites, no appreciable mass Extremities: No significant cyanosis, clubbing, or edema bilateral lower extremities  CBC: Recent Labs  Lab 03/26/18 0227 03/27/18 0247 03/28/18 0517  WBC 9.4 8.4 7.6  HGB 9.0* 8.5* 8.2*  HCT 26.6* 25.9* 23.9*  MCV 88.7 88.7 88.8  PLT 218 198 409   Basic Metabolic Panel: Recent Labs  Lab 03/26/18 0227 03/27/18 0247 03/28/18 0517  NA 138 137 136  K 3.4* 3.2*  3.6  CL 104 102 101  CO2 20* 23 25  GLUCOSE 148* 118* 118*  BUN 64* 70* 65*  CREATININE 2.92* 3.31* 3.13*  CALCIUM 9.2 8.9 8.8*  MG  --   --  2.0  PHOS  --   --  3.2   GFR: Estimated Creatinine Clearance: 14.1 mL/min (A) (by C-G formula based on SCr of 3.13 mg/dL (H)).  Liver Function Tests: Recent Labs  Lab 03/25/18 0808  AST 26  ALT 15  ALKPHOS 78  BILITOT 0.8  PROT 6.6  ALBUMIN 3.6    Coagulation Profile: Recent Labs  Lab 03/25/18 0808  INR 1.13    Cardiac Enzymes: Recent Labs  Lab  03/25/18 1422 03/25/18 2022 03/26/18 0227 03/26/18 1143  TROPONINI 4.16* 7.20* 13.23* 11.79*    HbA1C: Hgb A1c MFr Bld  Date/Time Value Ref Range Status  03/28/2018 05:17 AM 6.1 (H) 4.8 - 5.6 % Final    Comment:    (NOTE) Pre diabetes:          5.7%-6.4% Diabetes:              >6.4% Glycemic control for   <7.0% adults with diabetes   01/01/2018 01:18 PM 6.2 4.6 - 6.5 % Final    Comment:    Glycemic Control Guidelines for People with Diabetes:Non Diabetic:  <6%Goal of Therapy: <7%Additional Action Suggested:  >8%     CBG: Recent Labs  Lab 03/27/18 0826 03/27/18 1350 03/27/18 2131 03/28/18 0749 03/28/18 1246  GLUCAP 121* 99 132* 116* 166*    Recent Results (from the past 240 hour(s))  Blood culture (routine x 2)     Status: None (Preliminary result)   Collection Time: 03/25/18 10:24 AM  Result Value Ref Range Status   Specimen Description BLOOD RIGHT HAND  Final   Special Requests   Final    BOTTLES DRAWN AEROBIC AND ANAEROBIC Blood Culture adequate volume   Culture   Final    NO GROWTH 3 DAYS Performed at [facility], [place] [address] [place], [place] [address]    Report Status PENDING  Incomplete  Blood culture (routine x 2)     Status: None (Preliminary result)   Collection Time: 03/25/18 10:29 AM  Result Value Ref Range Status   Specimen Description BLOOD RIGHT ANTECUBITAL  Final   Special Requests   Final    BOTTLES DRAWN AEROBIC AND ANAEROBIC Blood Culture adequate volume   Culture   Final    NO GROWTH 3 DAYS Performed at [facility], [place] [address], [place], [place] [address]    Report Status PENDING  Incomplete  MRSA PCR Screening     Status: None   Collection Time: 03/25/18  6:20 PM  Result Value Ref Range Status   MRSA by PCR NEGATIVE NEGATIVE Final    Comment:        The GeneXpert MRSA Assay (FDA approved for NASAL specimens only), is one component of a comprehensive MRSA colonization surveillance program. It is not intended to  diagnose MRSA infection nor to guide or monitor treatment for MRSA infections. Performed at [facility], [place] [address] [place], [place] [address]      Scheduled Meds: . amLODipine  10 mg Oral QPM  . aspirin EC  81 mg Oral Daily  . atorvastatin  40 mg Oral QPM  . calcium acetate  667 mg Oral BID WC  . clopidogrel  75 mg Oral Daily  . ferrous gluconate  325 mg Oral Daily  . hydrALAZINE  25 mg Oral BID  . insulin aspart  0-9 Units Subcutaneous TID WC  . levothyroxine  50 mcg Oral QAC breakfast  . sodium chloride flush  3 mL Intravenous Q12H     LOS: 3 days   Cherene Altes, MD [facility] Office  [phone] Pager - Text Page per Shea Evans  If 7PM-7AM, please contact night-coverage per Amion 03/28/2018, 4:25 PM

## 2018-03-28 NOTE — Progress Notes (Signed)
Progress Note  Patient Name: Henry [name] Date of Encounter: 03/28/2018  Primary Cardiologist: Sherren Mocha, MD   Subjective   Doing well this AM with no CP or dyspnea  Inpatient Medications    Scheduled Meds: . amLODipine  10 mg Oral QPM  . aspirin EC  81 mg Oral Daily  . atorvastatin  40 mg Oral QPM  . calcium acetate  667 mg Oral BID WC  . ferrous gluconate  325 mg Oral Daily  . hydrALAZINE  25 mg Oral BID  . insulin aspart  0-9 Units Subcutaneous TID WC  . levothyroxine  50 mcg Oral QAC breakfast  . sodium chloride flush  3 mL Intravenous Q12H   Continuous Infusions: . sodium chloride     PRN Meds: sodium chloride, acetaminophen **OR** acetaminophen, ondansetron (ZOFRAN) IV, sodium chloride flush, traMADol   Vital Signs    Vitals:   03/27/18 1849 03/27/18 2134 03/27/18 2212 03/28/18 0327  BP: (!) 150/63 (!) 132/54 (!) 144/61 (!) 121/47  Pulse: 83 81  83  Resp: (!) 22 16  18   Temp:  98 F (36.7 C)  98.8 F (37.1 C)  TempSrc:  Oral  Oral  SpO2: 95% 94%  94%  Weight:    65 kg  Height:        Intake/Output Summary (Last 24 hours) at 03/28/2018 0801 Last data filed at 03/28/2018 9450 Gross per 24 hour  Intake 676.41 ml  Output 875 ml  Net -198.59 ml   Last 3 Weights 03/28/2018 03/26/2018 03/25/2018  Weight (lbs) 143 lb 4.8 oz 147 lb 0.8 oz 159 lb 9.8 oz  Weight (kg) 65 kg 66.7 kg 72.4 kg      Telemetry    Sinus with rare PAC- Personally Reviewed   Physical Exam   GEN: NAD WD Neck: supple Cardiac: RRR, 3/6 systolic murmur; no rub Respiratory: CTA; no wheeze GI: Soft, NT/ND, non masses MS: No edema; AV fistula LUE; radial cath site with no hematoma Neuro:  No focal findings   Labs    Chemistry Recent Labs  Lab 03/25/18 0808 03/26/18 0227 03/27/18 0247 03/28/18 0517  NA 136 138 137 136  K 3.7 3.4* 3.2* 3.6  CL 106 104 102 101  CO2 19* 20* 23 25  GLUCOSE 152* 148* 118* 118*  BUN 64* 64* 70* 65*  CREATININE 3.09* 2.92* 3.31* 3.13*   CALCIUM 9.0 9.2 8.9 8.8*  PROT 6.6  --   --   --   ALBUMIN 3.6  --   --   --   AST 26  --   --   --   ALT 15  --   --   --   ALKPHOS 78  --   --   --   BILITOT 0.8  --   --   --   GFRNONAA 17* 18* 15* 16*  GFRAA 19* 21* 18* 19*  ANIONGAP 11 14 12 10      Hematology Recent Labs  Lab 03/26/18 0227 03/27/18 0247 03/28/18 0517  WBC 9.4 8.4 7.6  RBC 3.00* 2.92* 2.69*  HGB 9.0* 8.5* 8.2*  HCT 26.6* 25.9* 23.9*  MCV 88.7 88.7 88.8  MCH 30.0 29.1 30.5  MCHC 33.8 32.8 34.3  RDW 13.0 12.9 12.9  PLT 218 198 196    Cardiac Enzymes Recent Labs  Lab 03/25/18 1422 03/25/18 2022 03/26/18 0227 03/26/18 1143  TROPONINI 4.16* 7.20* 13.23* 11.79*    Recent Labs  Lab 03/25/18 0824 03/25/18 1056  TROPIPOC 0.40* 1.42*     BNP Recent Labs  Lab 03/25/18 0808  BNP 669.1*      Radiology    Dg Chest 2 View  Result Date: 03/26/2018 CLINICAL DATA:  Respiratory failure EXAM: CHEST - 2 VIEW COMPARISON:  03/25/18 FINDINGS: Cardiac shadow is stable. Postsurgical changes are again noted. Patchy infiltrative changes are again seen in the right lung stable from the prior exam. No new focal infiltrate is seen. Small bilateral pleural effusions are noted. IMPRESSION: No change from the previous day. Electronically Signed   By: Inez Catalina M.D.   On: 03/26/2018 15:23   Nm Pulmonary Vent And Perf (v/q Scan)  Result Date: 03/26/2018 CLINICAL DATA:  Worsening short of breath. Chest discomfort. Pulmonary edema. EXAM: NUCLEAR MEDICINE VENTILATION - PERFUSION LUNG SCAN TECHNIQUE: Ventilation images were obtained in multiple projections using inhaled aerosol Tc-11m DTPA. Perfusion images were obtained in multiple projections after intravenous injection of Tc-65m MAA. RADIOPHARMACEUTICALS:  Thirty-one mCi of Tc-16m DTPA aerosol inhalation and 4.3 mCi Tc13m MAA IV COMPARISON:  None. FINDINGS: Ventilation: No focal ventilation defect. Perfusion: No wedge shaped peripheral perfusion defects to suggest  acute pulmonary embolism. IMPRESSION: No evidence acute pulmonary embolism. Electronically Signed   By: Suzy Bouchard M.D.   On: 03/26/2018 15:12    Patient Profile     85 y.o. male with past medical history of coronary artery disease status post coronary artery bypass graft, peripheral vascular disease, hypertension, hyperlipidemia, diabetes mellitus, moderate aortic stenosis being evaluated for pulmonary edema and non-ST elevation myocardial infarction.  Assessment & Plan    1. Non-ST elevation myocardial infarction-catheterization results noted.  Plan will be medical therapy.  Continue aspirin and statin.  Add plavix 75 mg daily. No beta-blocker given recent Mobitz 1 second-degree AV block.  Echocardiogram shows ejection fraction 50 to 55% with inferolateral hypokinesis.  There is moderate aortic stenosis and mild aortic insufficiency, mild to moderate mitral regurgitation.    2. Acute diastolic congestive heart failure-mildly diminished breath sounds at bases today.  I would like to hold Lasix today to hopefully stabilize renal function.  Will need to resume tomorrow at preadmission dose. 3. History of moderate aortic stenosis-moderate on echocardiogram.  Will need follow-up as an outpatient. 4. Chronic stage V kidney disease-renal function unchanged this morning.  Will hold diuretic today.  Recheck BUN and creatinine tomorrow morning.  Will likely need to resume Lasix tomorrow.  Nephrology following.  AV fistula in place but no clear indication for dialysis at this point.   5. History of severe peripheral vascular disease-continue aspirin and statin. 6. Hyperlipidemia-continue statin. 7. Hypertension-patient's blood pressure is controlled.  Continue present medications.  No beta-blocker as outlined above.  No ACE inhibitor or ARB given renal insufficiency.  Ambulate today and wean O2 as tolerated.  Recheck renal function in a.m.  Possible discharge tomorrow if renal function stable.  Will  need follow-up with Dr. Burt Knack.  For questions or updates, please contact [place] Please consult [email or web address] for contact info under        Signed, Kirk Ruths, MD  03/28/2018, 8:01 AM

## 2018-03-28 NOTE — Care Management Note (Signed)
Case Management Note  Patient Details  Name: KAYZEN KENDZIERSKI MRN: [id number] Date of Birth: 10/28/1924  Subjective/Objective: Pt presented for NStemi- Respiratory Failure with Hypoxia. Plan to ambulate to see if can wean 02. PTA from home with wife and active with [facility]. Pt will need resumption orders and F2F once stable.                  Action/Plan: CM will continue to monitor for DME Oxygen needs.   Expected Discharge Date:                  Expected Discharge Plan:  [place]  In-House Referral:  NA  Discharge planning Services  CM Consult  Post Acute Care Choice:  Home Health, Resumption of Svcs/PTA Provider Choice offered to:  Patient  DME Arranged:  N/A DME Agency:  NA  HH Arranged:  RN, Disease Management HH Agency:  [facility]  Status of Service:  In process, will continue to follow  If discussed at Long Length of Stay Meetings, dates discussed:    Additional Comments:  Bethena Roys, RN 03/28/2018, 10:41 AM

## 2018-03-28 NOTE — Progress Notes (Addendum)
[facility] Progress Note    Assessment/ Plan:   85 y.o.maleCKD5 with baseline creatinine in the mid to high 2's followed by Dr. Marval Regal, CASHD, DM, HLD, HTN with recent left BCF placed by Dr. 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 Burt Knack p/w worsening dyspnea and CP. His BUN/Cr was 64/3.09 in ED. Usually on Lasix 40mg  twice daily at home  1. Acute on CKD5 with BL Cr 2.4-2.8 nearing dialysis and e/o volume overload by history and exam.  Down 7kg since admit, had very good diuresis, leg edema gone per patient. Will repeat CXR.  Still requiring O2 but hesitant to give further diuretics w/ creat bump as it did yesterday. Creat down today after heart cath which is good. For recheck in am.   - no indication for dialysis at this time, no sign of contrast damage  - if doing well tomorrow per cardiology pt can be dc'd - he has LUA AVF placed recently, will be ready in 47mos +/-  2. NSTEMI  with prior h/o CASHD. SP LHC yest on 1/30, 2 of 4 grafts chronically occluded, no intervention needed.   3. DM 4. HTN 5. Respiratory distress: resolved, LE edema gone and no vol excess on exam today. Will repeat CXR tonight.  6. Anemia - will check iron panel 7. HPT - check phos and pth  Subjective:   Feeling better as far as the dyspnea as says his very swollen legs are "back to normal" Denies f/c/n/v.  2 daughter bedside.   Objective:   BP (!) 131/52 (BP Location: Right Arm)   Pulse 82   Temp 98 F (36.7 C) (Oral)   Resp 18   Ht 5\' 9"  (1.753 m)   Wt 65 kg   SpO2 94%   BMI 21.16 kg/m   Intake/Output Summary (Last 24 hours) at 03/28/2018 1538 Last data filed at 03/28/2018 0626 Gross per 24 hour  Intake 676.41 ml  Output 325 ml  Net 351.41 ml   Weight change:   Physical Exam: GEN: NAD, alert, NCAT HEENT: No conjunctival pallor, EOMI NECK: Supple, no thyromegaly LUNGS: Rales R base, L clear  CV: RRR, No M/R/G ABD: SNDNT +BS  EXT:  No lower extremity edema Access: LUA BCF with good bruit  Imaging: No results found.  Labs: BMET Recent Labs  Lab 03/25/18 0808 03/26/18 0227 03/27/18 0247 03/28/18 0517  NA 136 138 137 136  K 3.7 3.4* 3.2* 3.6  CL 106 104 102 101  CO2 19* 20* 23 25  GLUCOSE 152* 148* 118* 118*  BUN 64* 64* 70* 65*  CREATININE 3.09* 2.92* 3.31* 3.13*  CALCIUM 9.0 9.2 8.9 8.8*  PHOS  --   --   --  3.2   CBC Recent Labs  Lab 03/25/18 0808 03/26/18 0227 03/27/18 0247 03/28/18 0517  WBC 10.0 9.4 8.4 7.6  HGB 9.0* 9.0* 8.5* 8.2*  HCT 27.4* 26.6* 25.9* 23.9*  MCV 92.6 88.7 88.7 88.8  PLT 191 218 198 196    Medications:    . amLODipine  10 mg Oral QPM  . aspirin EC  81 mg Oral Daily  . atorvastatin  40 mg Oral QPM  . calcium acetate  667 mg Oral BID WC  . clopidogrel  75 mg Oral Daily  . ferrous gluconate  325 mg Oral Daily  . furosemide  80 mg Intravenous Once  . hydrALAZINE  25 mg Oral BID  . insulin aspart  0-9 Units Subcutaneous TID WC  . levothyroxine  50 mcg Oral QAC breakfast  . sodium chloride flush  3 mL Intravenous Q12H      Otelia Santee, MD 03/28/2018, 3:38 PM

## 2018-03-28 NOTE — Progress Notes (Signed)
SATURATION QUALIFICATIONS: (This note is used to comply with regulatory documentation for home oxygen)  Patient Saturations on Room Air at Rest =  99 %  Patient Saturations on Room Air while Ambulating = 76 %  Patient Saturations on 3 Liters of oxygen while Ambulating =  96 %  Please briefly explain why patient needs home oxygen:

## 2018-03-29 LAB — BASIC METABOLIC PANEL
Anion gap: 11 (ref 5–15)
BUN: 62 mg/dL — AB (ref 8–23)
CO2: 22 mmol/L (ref 22–32)
Calcium: 9 mg/dL (ref 8.9–10.3)
Chloride: 105 mmol/L (ref 98–111)
Creatinine, Ser: 3.18 mg/dL — ABNORMAL HIGH (ref 0.61–1.24)
GFR calc Af Amer: 19 mL/min — ABNORMAL LOW (ref >60)
GFR calc non Af Amer: 16 mL/min — ABNORMAL LOW (ref >60)
Glucose, Bld: 107 mg/dL — ABNORMAL HIGH (ref 70–99)
Potassium: 3.5 mmol/L (ref 3.5–5.1)
Sodium: 138 mmol/L (ref 135–145)

## 2018-03-29 LAB — CBC
HCT: 25.3 % — ABNORMAL LOW (ref 39.0–52.0)
HEMOGLOBIN: 8.4 g/dL — AB (ref 13.0–17.0)
MCH: 29.6 pg (ref 26.0–34.0)
MCHC: 33.2 g/dL (ref 30.0–36.0)
MCV: 89.1 fL (ref 80.0–100.0)
Platelets: 219 10*3/uL (ref 150–400)
RBC: 2.84 MIL/uL — ABNORMAL LOW (ref 4.22–5.81)
RDW: 12.7 % (ref 11.5–15.5)
WBC: 7.8 10*3/uL (ref 4.0–10.5)
nRBC: 0 % (ref 0.0–0.2)

## 2018-03-29 LAB — GLUCOSE, CAPILLARY
Glucose-Capillary: 108 mg/dL — ABNORMAL HIGH (ref 70–99)
Glucose-Capillary: 132 mg/dL — ABNORMAL HIGH (ref 70–99)
Glucose-Capillary: 117 mg/dL — ABNORMAL HIGH (ref 70–99)

## 2018-03-29 MED ORDER — FUROSEMIDE 40 MG PO TABS
40.0000 mg | ORAL_TABLET | Freq: Every day | ORAL | Status: DC
  Administered 2018-03-29 – 2018-03-31 (×3): 40 mg via ORAL
  Filled 2018-03-29 (×3): qty 1

## 2018-03-29 NOTE — Progress Notes (Addendum)
Henry [name]  Assessment/ Plan: Pt is a 85 y.o. yo male  CKD5 with baseline creatinine in the mid to high 2's followed by Dr. Marval Regal, CASHD, DM, HLD, HTN with recent left BCF placed by Dr. 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 Cooperp/wworsening dyspnea and CP.His BUN/Cr was64/3.09in ED.   #Acute on CKD 5 with baseline creatinine around 2.4-2.8: -Patient lost around 7 kg since admission with good diuresis.  His lower extremity edema is better however he is still having dyspnea on exertion and crackles on exam. Repeat chest x-ray consistent with pulmonary edema.  The serum creatinine level is stable 3.1 today. Currently not on diuretics, plan to restart oral Lasix 40 daily.  Discussed with the patient and his daughters regarding daily weight monitoring. Advised to take extra dose of Lasix if he gains more than 2 lb weight or associated with shortness of breath or cough. -No indication for dialysis, no sign of contrast nephropathy. AV fistula in left upper extremity is maturing. -Monitor BMP, strict ins and out. -If he is being discharged recommend renal panel in one week and outpatient follow-up.  #Acute respiratory distress: Still using around 2 L of oxygen.  Recommend ambulatory oxygen saturation assessment.  #Hypertension: Pressure acceptable.  Continue current medication.  #NSTEMI, h/o CABG, status post left heart cath on 1/30, on medical management.  Subjective: Seen and examined at bedside.  Reports some cough.  Denied chest pain, shortness of breath.  His daughter at bedside.  Nasal cannula with around 2 L of oxygen. Objective Vital signs in last 24 hours: Vitals:   03/28/18 1729 03/28/18 2129 03/29/18 0448 03/29/18 0859  BP: (!) 151/61 (!) 145/52 (!) 140/53 (!) 134/50  Pulse:  78 73   Resp:  (!) 23 (!) 22   Temp:  97.8 F (36.6 C) 98.8 F (37.1 C)   TempSrc:  Oral Oral   SpO2:  93% 100%    Weight:   65 kg   Height:       Weight change: -0.045 kg  Intake/Output Summary (Last 24 hours) at 03/29/2018 0919 Last data filed at 03/29/2018 0452 Gross per 24 hour  Intake 843 ml  Output 1020 ml  Net -177 ml       Labs: Basic Metabolic Panel: Recent Labs  Lab 03/27/18 0247 03/28/18 0517 03/29/18 0448  NA 137 136 138  K 3.2* 3.6 3.5  CL 102 101 105  CO2 23 25 22   GLUCOSE 118* 118* 107*  BUN 70* 65* 62*  CREATININE 3.31* 3.13* 3.18*  CALCIUM 8.9 8.8* 9.0  PHOS  --  3.2  --    Liver Function Tests: Recent Labs  Lab 03/25/18 0808  AST 26  ALT 15  ALKPHOS 78  BILITOT 0.8  PROT 6.6  ALBUMIN 3.6   No results for input(s): LIPASE, AMYLASE in the last 168 hours. No results for input(s): AMMONIA in the last 168 hours. CBC: Recent Labs  Lab 03/25/18 0808 03/26/18 0227 03/27/18 0247 03/28/18 0517 03/29/18 0448  WBC 10.0 9.4 8.4 7.6 7.8  HGB 9.0* 9.0* 8.5* 8.2* 8.4*  HCT 27.4* 26.6* 25.9* 23.9* 25.3*  MCV 92.6 88.7 88.7 88.8 89.1  PLT 191 218 198 196 219   Cardiac Enzymes: Recent Labs  Lab 03/25/18 1422 03/25/18 2022 03/26/18 0227 03/26/18 1143  TROPONINI 4.16* 7.20* 13.23* 11.79*   CBG: Recent Labs  Lab 03/28/18 0749 03/28/18 1246 03/28/18 1647 03/28/18 2127  03/29/18 0753  GLUCAP 116* 166* 110* 138* 108*    Iron Studies: No results for input(s): IRON, TIBC, TRANSFERRIN, FERRITIN in the last 72 hours. Studies/Results: Dg Chest 2 View  Result Date: 03/28/2018 CLINICAL DATA:  Shortness of breath.  Pulmonary edema. EXAM: CHEST - 2 VIEW COMPARISON:  03/26/2018, 03/25/2018 and 01/06/2018 FINDINGS: There are bilateral pulmonary infiltrates which appear slightly worse than on the prior study. There are small bilateral pleural effusions, diminished slightly. Heart size is normal. CABG. Pulmonary vascularity is at the upper limits of normal. No acute bone abnormality. IMPRESSION: Overall slight progression pulmonary edema. However, the pleural effusions  have diminished. Electronically Signed   By: Lorriane Shire M.D.   On: 03/28/2018 17:51    Medications: Infusions: . sodium chloride      Scheduled Medications: . amLODipine  10 mg Oral QPM  . aspirin EC  81 mg Oral Daily  . atorvastatin  40 mg Oral QPM  . calcium acetate  667 mg Oral BID WC  . clopidogrel  75 mg Oral Daily  . ferrous gluconate  325 mg Oral Daily  . furosemide  40 mg Oral Daily  . hydrALAZINE  25 mg Oral BID  . insulin aspart  0-9 Units Subcutaneous TID WC  . levothyroxine  50 mcg Oral QAC breakfast  . sodium chloride flush  3 mL Intravenous Q12H    have reviewed scheduled and prn medications.  Physical Exam: General:NAD, comfortable Heart:RRR, s1s2 nl Lungs: Bilateral basal crackles, no wheeze Abdomen:soft, Non-tender, non-distended Extremities: Trace ankle edema Dialysis Access: Left upper extremity AV fistula site wound healing well with good thrill and bruit.   Prasad  03/29/2018,9:19 AM  LOS: 4 days

## 2018-03-29 NOTE — Evaluation (Signed)
Occupational Therapy Evaluation Patient Details Name: Henry [name] MRN: [id number] DOB: 1925-02-20 Today's Date: 03/29/2018    History of Present Illness 85 y.o. male w/ a hx of CKD stage V who has AV fistula in place in anticipation of dialysis initiation, CAD, DM2, HLD, HTN, and anemia of chronic disease who presented with 2 days of worsening shortness of breath and chest discomfort. NSTEMI with medical management as well as CHF exacerbation.     Clinical Impression   Pt admitted with the above diagnoses and presents with below problem list. Pt will benefit from continued acute OT to address the below listed deficits and maximize independence with basic ADLs prior to d/c to venue below. 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 Pt is currently setup to min guard with ADLs, functional transfers, and mobility. Daughter present during session. Recommend maximizing HH services to allow pt to safely d/c back home with increased level of assistance/supervision.      Follow Up Recommendations  Home health OT;Supervision/Assistance - 24 hour    Equipment Recommendations  None recommended by OT    Recommendations for Other Services       Precautions / Restrictions Precautions Precautions: Fall Restrictions Weight Bearing Restrictions: No      Mobility Bed Mobility                  Transfers Overall transfer level: Needs assistance Equipment used: Rolling walker (2 wheeled) Transfers: Sit to/from Stand Sit to Stand: Supervision              Balance Overall balance assessment: Needs assistance Sitting-balance support: No upper extremity supported;Feet supported Sitting balance-Leahy Scale: Fair     Standing balance support: Bilateral upper extremity supported;During functional activity Standing balance-Leahy Scale: Poor Standing balance comment: relies on UE support for balance                           ADL either  performed or assessed with clinical judgement   ADL Overall ADL's : Needs assistance/impaired Eating/Feeding: Set up;Sitting   Grooming: Oral care;Wash/dry hands;Min guard;Standing   Upper Body Bathing: Set up;Sitting   Lower Body Bathing: Min guard;Sit to/from stand   Upper Body Dressing : Set up;Sitting   Lower Body Dressing: Min guard;Sit to/from stand   Toilet Transfer: Min guard;RW   Toileting- Water quality scientist and Hygiene: Min guard;Sit to/from stand   Tub/ Shower Transfer: Min guard;Ambulation;Rolling walker   Functional mobility during ADLs: Min guard;Rolling walker General ADL Comments: Pt completed in-room functional mobility, stood at sink to complete 2 grooming tasks in standing, stood to void. Tolerated session well.     Vision         Perception     Praxis      Pertinent Vitals/Pain Pain Assessment: No/denies pain     Hand Dominance Right   Extremity/Trunk Assessment Upper Extremity Assessment Upper Extremity Assessment: Overall WFL for tasks assessed;Generalized weakness   Lower Extremity Assessment Lower Extremity Assessment: Defer to PT evaluation       Communication Communication Communication: No difficulties   Cognition Arousal/Alertness: Awake/alert Behavior During Therapy: WFL for tasks assessed/performed Overall Cognitive Status: Within Functional Limits for tasks assessed                                     General Comments  Daughter present  during session.    Exercises     Shoulder Instructions      Home Living Family/patient expects to be discharged to:: Private residence Living Arrangements: Spouse/significant other Available Help at Discharge: Family(pt is caregiver for wife) Type of Home: House Home Access: Stairs to enter Technical brewer of Steps: 2   Home Layout: Two level;Bed/bath upstairs;1/2 bath on main level Alternate Level Stairs-Number of Steps: flight Alternate Level  Stairs-Rails: Right;Left;Can reach both Bathroom Shower/Tub: Tub only   Biochemist, clinical: Standard     Home Equipment: Cane - single point;Shower seat;Grab bars - tub/shower;Hand held shower head   Additional Comments: 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  Walks with RW, pt assists with wife B/D.        Prior Functioning/Environment Level of Independence: Independent                 OT Problem List: Decreased activity tolerance;Impaired balance (sitting and/or standing);Decreased strength;Decreased knowledge of use of DME or AE;Decreased knowledge of precautions;Cardiopulmonary status limiting activity      OT Treatment/Interventions: Self-care/ADL training;Therapeutic exercise;Energy conservation;DME and/or AE instruction;Therapeutic activities;Patient/family education;Balance training    OT Goals(Current goals can be found in the care plan section) Acute Rehab OT Goals Patient Stated Goal: to go home OT Goal Formulation: With patient/family Time For Goal Achievement: 04/12/18 Potential to Achieve Goals: Good ADL Goals Pt Will Perform Grooming: with modified independence;standing Pt Will Perform Lower Body Bathing: with modified independence;sit to/from stand Pt Will Perform Lower Body Dressing: with modified independence;sit to/from stand Pt Will Transfer to Toilet: with modified independence;ambulating Pt Will Perform Toileting - Clothing Manipulation and hygiene: with modified independence Pt Will Perform Tub/Shower Transfer: with modified independence;ambulating;rolling walker  OT Frequency: Min 2X/week   Barriers to D/C:    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       Co-evaluation              AM-PAC OT "6 Clicks" Daily Activity     Outcome Measure Help from another person eating meals?: None Help from another person taking care of personal grooming?: None Help from another person toileting, which includes using toliet, bedpan, or urinal?:  None Help from another person bathing (including washing, rinsing, drying)?: A Little Help from another person to put on and taking off regular upper body clothing?: None Help from another person to put on and taking off regular lower body clothing?: None 6 Click Score: 23   End of Session Equipment Utilized During Treatment: Rolling walker  Activity Tolerance: Patient tolerated treatment well Patient left: in chair;with call bell/phone within reach;with chair alarm set;with family/visitor present  OT Visit Diagnosis: Unsteadiness on feet (R26.81);Muscle weakness (generalized) (M62.81);History of falling (Z91.81)                Time: 1410-1430 OT Time Calculation (min): 20 min Charges:  OT General Charges $OT Visit: 1 Visit OT Evaluation $OT Eval Low Complexity: [place], OT Acute Rehabilitation Services Pager: [phone] Office: [phone]   Hortencia Pilar 03/29/2018, 3:51 PM

## 2018-03-29 NOTE — Evaluation (Signed)
Physical Therapy Evaluation Patient Details Name: Henry [name] MRN: [id number] DOB: 1925/01/30 Today's Date: 03/29/2018   History of Present Illness  85 y.o. male w/ a hx of CKD stage V who has AV fistula in place in anticipation of dialysis initiation, CAD, DM2, HLD, HTN, and anemia of chronic disease who presented with 2 days of worsening shortness of breath and chest discomfort. NSTEMI with medical management as well as CHF exacerbation.    Clinical Impression  Pt admitted with above diagnosis. Pt currently with functional limitations due to the deficits listed below (see PT Problem List). Pt was able to ambulate with rollator with good stability.  Daughters and pt like rollator and want to get one for home.  Pt was caregiver for his wife.  Family plans to hire a sitter to stay with pt and wife.  Also recommend [facility] services for pt.  Called CM to assist with [facility] f/u and any recommendations of sitters.  Will follow acutely.  Pt will benefit from skilled PT to increase their independence and safety with mobility to allow discharge to the venue listed below.     Follow Up Recommendations Home health PT;Supervision - Intermittent(HHOT, HHAide, HHSW, HHRN)    Equipment Recommendations  Other (comment)(Rollator)    Recommendations for Other Services       Precautions / Restrictions Precautions Precautions: Fall Restrictions Weight Bearing Restrictions: No      Mobility  Bed Mobility Overal bed mobility: Independent                Transfers Overall transfer level: Needs assistance Equipment used: 4-wheeled walker Transfers: Sit to/from Stand Sit to Stand: Supervision         General transfer comment: cues on how to use rollator (lock brakes etc)  Ambulation/Gait Ambulation/Gait assistance: Supervision;Min guard Gait Distance (Feet): 350 Feet Assistive device: 4-wheeled walker Gait Pattern/deviations: Step-through pattern;Decreased stride length   Gait velocity  interpretation: 1.31 - 2.62 ft/sec, indicative of limited community ambulator General Gait Details: Pt did really well with rollator today.  Pt was able to ambulate in hallway with good stability.  Locked and unlocked brakes.  STayed in good proximity to rollator. Practiced sitting in hallway on rollator as well.  Daughters present and they like the rollator for the pt.  Pt O2 stayed >98% with ambulation on RA.  Notified nurse that PT left O2 off as O2 was 98-100% entire time.   Stairs            Wheelchair Mobility    Modified Rankin (Stroke Patients Only)       Balance Overall balance assessment: Needs assistance Sitting-balance support: No upper extremity supported;Feet supported Sitting balance-Leahy Scale: Fair     Standing balance support: Bilateral upper extremity supported;During functional activity Standing balance-Leahy Scale: Poor Standing balance comment: relies on UE support for balance                             Pertinent Vitals/Pain Pain Assessment: No/denies pain    Home Living Family/patient expects to be discharged to:: Private residence Living Arrangements: Spouse/significant other Available Help at Discharge: Family(pt is caregiver for wife) Type of Home: House Home Access: Stairs to enter   Technical brewer of Steps: 2 Home Layout: Two level;Bed/bath upstairs;1/2 bath on main level Home Equipment: Cane - single point;Shower seat;Grab bars - tub/shower;Hand held shower head Additional Comments: 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  Walks with RW,  pt assists with wife B/D.      Prior Function Level of Independence: Independent               Hand Dominance   Dominant Hand: Right    Extremity/Trunk Assessment   Upper Extremity Assessment Upper Extremity Assessment: Defer to OT evaluation    Lower Extremity Assessment Lower Extremity Assessment: Overall WFL for tasks assessed    Cervical / Trunk  Assessment Cervical / Trunk Assessment: Normal  Communication   Communication: No difficulties  Cognition Arousal/Alertness: Awake/alert Behavior During Therapy: WFL for tasks assessed/performed Overall Cognitive Status: Within Functional Limits for tasks assessed                                        General Comments      Exercises     Assessment/Plan    PT Assessment Patient needs continued PT services  PT Problem List Decreased balance;Decreased activity tolerance;Decreased mobility;Decreased knowledge of use of DME;Decreased safety awareness;Decreased knowledge of precautions;Cardiopulmonary status limiting activity       PT Treatment Interventions DME instruction;Gait training;Functional mobility training;Therapeutic activities;Therapeutic exercise;Balance training;Patient/family education;Stair training    PT Goals (Current goals can be found in the Care Plan section)  Acute Rehab PT Goals Patient Stated Goal: to go home PT Goal Formulation: With patient Time For Goal Achievement: 04/12/18 Potential to Achieve Goals: Good    Frequency Min 3X/week   Barriers to discharge Decreased caregiver support(pt is a caregiver for his wife)      Co-evaluation               AM-PAC PT "6 Clicks" Mobility  Outcome Measure Help needed turning from your back to your side while in a flat bed without using bedrails?: None Help needed moving from lying on your back to sitting on the side of a flat bed without using bedrails?: None Help needed moving to and from a bed to a chair (including a wheelchair)?: None Help needed standing up from a chair using your arms (e.g., wheelchair or bedside chair)?: A Little Help needed to walk in hospital room?: A Little Help needed climbing 3-5 steps with a railing? : A Little 6 Click Score: 21    End of Session Equipment Utilized During Treatment: Gait belt Activity Tolerance: Patient tolerated treatment well Patient  left: in chair;with call bell/phone within reach;with chair alarm set;with nursing/sitter in room Nurse Communication: Mobility status(O2 sats >98% entire treatment on RA) PT Visit Diagnosis: Muscle weakness (generalized) (M62.81)    Time: 0960-4540 PT Time Calculation (min) (ACUTE ONLY): 50 min   Charges:   PT Evaluation $PT Eval Moderate Complexity: 1 Mod PT Treatments $Gait Training: 8-22 mins $Self Care/Home Management: 8-22        [place] Pager:  [phone]  Office:  [place] 03/29/2018, 10:48 AM

## 2018-03-29 NOTE — Care Management Note (Addendum)
Case Management Note  Patient Details  Name: Henry [name] MRN: [id number] Date of Birth: 06/19/1924  Subjective/Objective:   Visited patient and daughter at daughter's request.  Pt was set up with [facility] from MD office.  Pt and daughter would like to switch to [place] as patient's wife had previously used them.  Daughter given a copy of Medicare rated list.   Daughter had many questions/concerns about community resources.  Discussed community services with daughter at length, including [facility] and personal care services.  Daughter lives in [place] and patient lives with wife, who has dementia.  Daughter feels that they will need personal care services and pt will need transportation to  HD in the future.    Daughter would like to have [place] RN, PT, NA, CSW.              Action/Plan: Oswald Hillock with [facility] notified that patient requests [place].  [facility] will close case.  Brad with [facility] notified that pt would like to open services with [facility].  Will need HH orders.   [facility] referral made to assist pt/daughter with community resources/care coordination.    Expected Discharge Date:       03/31/18           Expected Discharge Plan:  [place]  In-House Referral:  NA  Discharge planning Services  CM Consult  Post Acute Care Choice:  Home Health Choice offered to:  Patient, Adult Children  DME Arranged:  Rollator DME Agency:  [facility] HH Arranged:  RN, Disease Management [place] Agency:  [place]  Status of Service:  In process, will continue to follow  If discussed at Long Length of Stay Meetings, dates discussed:    Additional Comments:  Claudie Leach, RN 03/29/2018, 12:30 PM

## 2018-03-29 NOTE — Progress Notes (Signed)
[place] TEAM 1 - Stepdown/ICU TEAM  Henry [name]  YFV:[id number] DOB: [birth year] DOA: 03/25/2018 PCP: [name], MD    Brief Narrative:  85 y.o.malew/ a hx of CKD stage V who has AV fistula in place in anticipation of dialysis initiation, CAD, DM2, HLD, HTN, and anemia of chronic disease who presented with 2 days of worsening shortness of breath and chest discomfort.  Chest x-ray showed pulmonary edema right greater than left.  Doppler of the lower extremities was negative.  He was given IV Lasix/IV Levaquin and admitted with cardiology/nephrology consultations.  Since admission, patient underwent VQ scan which was negative for pulmonary embolism.  Troponin peaked to 13 during the hospital course.    Subjective: The patient is up moving around his room with a therapist without oxygen on with his saturations in the upper 90s.  He denies chest pain nausea or vomiting.  He states he feels much better in general.  Assessment & Plan:  NSTEMI Management per Cardiology w/ plan for medical tx only - Plavix added to ASA and statin - no BB due to AVB - cath 1/30 noted the following: Severe native 3 vessel disease with total occlusion of the RCA and LAD, severe stenosis of the left circumflex, S/P remote CABG with continued patency of the LIMA-LAD and SVG-diagonal, total occlusion of the SVG-RCA and SVG-PDA  Acute respiratory failure - Diastolic CHF exacerbation - Pulmonary edema  Status post IV diuresis - diuretic being held due to climbing crt - f/u CXR 1/31 suggested increased pulmonary edema but decreased pleural effusions - no signif peripheral edema on exam today - net negative ~3.5L -attempt to resume diuresis today with creatinine stabilizing -assess oxygen saturation with ambulation  Filed Weights   03/26/18 0500 03/28/18 0327 03/29/18 0448  Weight: 66.7 kg 65 kg 65 kg    Mod AoS Stable per TTE - f/u w/ Cards as outpt   Acute on CKD stage V patient nearing dialysis - care  per Nephrology - crt slightly stable - resuming oral Lasix today -patient being directed on a weight-based extra Lasix dosing strategy for use at home  Recent Labs  Lab 03/25/18 0808 03/26/18 0227 03/27/18 0247 03/28/18 0517 03/29/18 0448  CREATININE 3.09* 2.92* 3.31* 3.13* 3.18*     Hypertension BP stable presently   Diabetes mellitus CBG well controlled - A1c 6.1  Anemia of chronic disease Hgb holding steady - transfuse for Hgb < 8.0  Hypothyroidism Cont Synthroid  DVT prophylaxis:  Code Status: FULL CODE Family Communication: spoke w/ daughter at bedside  Disposition Plan: Continue to wean oxygen as able -check saturations with ambulation -potential discharge home 24-48 hours  Consultants:  Cardiology  Antimicrobials:  None    Objective: Blood pressure 133/82, pulse 84, temperature (!) 97.4 F (36.3 C), temperature source Oral, resp. rate (!) 25, height 5\' 9"  (1.753 m), weight 65 kg, SpO2 100 %.  Intake/Output Summary (Last 24 hours) at 03/29/2018 1409 Last data filed at 03/29/2018 0900 Gross per 24 hour  Intake 723 ml  Output 1070 ml  Net -347 ml   Filed Weights   03/26/18 0500 03/28/18 0327 03/29/18 0448  Weight: 66.7 kg 65 kg 65 kg    Examination: General: No acute respiratory distress - a&O Lungs: mild bibasilar crackles - no wheezing or crackles  Cardiovascular: RRR w/ stable systolic M - no rub  Abdomen: NT/ND, soft, bs+, no mass  Extremities: No signif edema bilateral lower extremities  CBC: Recent Labs  Lab  03/27/18 0247 03/28/18 0517 03/29/18 0448  WBC 8.4 7.6 7.8  HGB 8.5* 8.2* 8.4*  HCT 25.9* 23.9* 25.3*  MCV 88.7 88.8 89.1  PLT 198 196 732   Basic Metabolic Panel: Recent Labs  Lab 03/27/18 0247 03/28/18 0517 03/29/18 0448  NA 137 136 138  K 3.2* 3.6 3.5  CL 102 101 105  CO2 23 25 22   GLUCOSE 118* 118* 107*  BUN 70* 65* 62*  CREATININE 3.31* 3.13* 3.18*  CALCIUM 8.9 8.8* 9.0  MG  --  2.0  --   PHOS  --  3.2  --     GFR: Estimated Creatinine Clearance: 13.9 mL/min (A) (by C-G formula based on SCr of 3.18 mg/dL (H)).  Liver Function Tests: Recent Labs  Lab 03/25/18 0808  AST 26  ALT 15  ALKPHOS 78  BILITOT 0.8  PROT 6.6  ALBUMIN 3.6    Coagulation Profile: Recent Labs  Lab 03/25/18 0808  INR 1.13    Cardiac Enzymes: Recent Labs  Lab 03/25/18 1422 03/25/18 2022 03/26/18 0227 03/26/18 1143  TROPONINI 4.16* 7.20* 13.23* 11.79*    HbA1C: Hgb A1c MFr Bld  Date/Time Value Ref Range Status  03/28/2018 05:17 AM 6.1 (H) 4.8 - 5.6 % Final    Comment:    (NOTE) Pre diabetes:          5.7%-6.4% Diabetes:              >6.4% Glycemic control for   <7.0% adults with diabetes   01/01/2018 01:18 PM 6.2 4.6 - 6.5 % Final    Comment:    Glycemic Control Guidelines for People with Diabetes:Non Diabetic:  <6%Goal of Therapy: <7%Additional Action Suggested:  >8%     CBG: Recent Labs  Lab 03/28/18 1246 03/28/18 1647 03/28/18 2127 03/29/18 0753 03/29/18 1118  GLUCAP 166* 110* 138* 108* 132*    Recent Results (from the past 240 hour(s))  Blood culture (routine x 2)     Status: None (Preliminary result)   Collection Time: 03/25/18 10:24 AM  Result Value Ref Range Status   Specimen Description BLOOD RIGHT HAND  Final   Special Requests   Final    BOTTLES DRAWN AEROBIC AND ANAEROBIC Blood Culture adequate volume   Culture NO GROWTH 4 DAYS  Final   Report Status PENDING  Incomplete  Blood culture (routine x 2)     Status: None (Preliminary result)   Collection Time: 03/25/18 10:29 AM  Result Value Ref Range Status   Specimen Description BLOOD RIGHT ANTECUBITAL  Final   Special Requests   Final    BOTTLES DRAWN AEROBIC AND ANAEROBIC Blood Culture adequate volume   Culture NO GROWTH 4 DAYS  Final   Report Status PENDING  Incomplete  MRSA PCR Screening     Status: None   Collection Time: 03/25/18  6:20 PM  Result Value Ref Range Status   MRSA by PCR NEGATIVE NEGATIVE Final     Comment:        The GeneXpert MRSA Assay (FDA approved for NASAL specimens only), is one component of a comprehensive MRSA colonization surveillance program. It is not intended to diagnose MRSA infection nor to guide or monitor treatment for MRSA infections. Performed at [facility], [place] [address] [place], [place] [address]      Scheduled Meds: . amLODipine  10 mg Oral QPM  . aspirin EC  81 mg Oral Daily  . atorvastatin  40 mg Oral QPM  . calcium acetate  667 mg Oral BID WC  . clopidogrel  75 mg Oral Daily  . ferrous gluconate  325 mg Oral Daily  . furosemide  40 mg Oral Daily  . hydrALAZINE  25 mg Oral BID  . insulin aspart  0-9 Units Subcutaneous TID WC  . levothyroxine  50 mcg Oral QAC breakfast  . sodium chloride flush  3 mL Intravenous Q12H     LOS: 4 days   Cherene Altes, MD [facility] Office  [phone] Pager - Text Page per Shea Evans  If 7PM-7AM, please contact night-coverage per Amion 03/29/2018, 2:09 PM

## 2018-03-29 NOTE — Progress Notes (Signed)
Progress Note  Patient Name: Henry [name] Date of Encounter: 03/29/2018  Primary Cardiologist: Sherren Mocha, MD   Subjective   Currently feeling well today with no chest pain or dyspnea.  He was having some dizziness yesterday which is improved.  His appetite is also improved.  Inpatient Medications    Scheduled Meds: . amLODipine  10 mg Oral QPM  . aspirin EC  81 mg Oral Daily  . atorvastatin  40 mg Oral QPM  . calcium acetate  667 mg Oral BID WC  . clopidogrel  75 mg Oral Daily  . ferrous gluconate  325 mg Oral Daily  . hydrALAZINE  25 mg Oral BID  . insulin aspart  0-9 Units Subcutaneous TID WC  . levothyroxine  50 mcg Oral QAC breakfast  . sodium chloride flush  3 mL Intravenous Q12H   Continuous Infusions: . sodium chloride     PRN Meds: sodium chloride, acetaminophen, ondansetron (ZOFRAN) IV, sodium chloride flush, traMADol   Vital Signs    Vitals:   03/28/18 1250 03/28/18 1729 03/28/18 2129 03/29/18 0448  BP: (!) 131/52 (!) 151/61 (!) 145/52 (!) 140/53  Pulse: 82  78 73  Resp:   (!) 23 (!) 22  Temp: 98 F (36.7 C)  97.8 F (36.6 C) 98.8 F (37.1 C)  TempSrc: Oral  Oral Oral  SpO2: 94%  93% 100%  Weight:    65 kg  Height:        Intake/Output Summary (Last 24 hours) at 03/29/2018 0830 Last data filed at 03/29/2018 5631 Gross per 24 hour  Intake 1203 ml  Output 1020 ml  Net 183 ml   Last 3 Weights 03/29/2018 03/28/2018 03/26/2018  Weight (lbs) 143 lb 3.2 oz 143 lb 4.8 oz 147 lb 0.8 oz  Weight (kg) 64.955 kg 65 kg 66.7 kg      Telemetry    Sinus rhythm- Personally Reviewed   Physical Exam   GEN: Well nourished, well developed, in no acute distress  HEENT: normal  Neck: no JVD, carotid bruits, or masses Cardiac: RRR; no murmurs, rubs, or gallops,no edema  Respiratory: Bibasilar crackles GI: soft, nontender, nondistended, + BS MS: no deformity or atrophy  Skin: warm and dry,  Neuro:  Strength and sensation are intact Psych: euthymic mood,  full affect    Labs    Chemistry Recent Labs  Lab 03/25/18 0808  03/27/18 0247 03/28/18 0517 03/29/18 0448  NA 136   < > 137 136 138  K 3.7   < > 3.2* 3.6 3.5  CL 106   < > 102 101 105  CO2 19*   < > 23 25 22   GLUCOSE 152*   < > 118* 118* 107*  BUN 64*   < > 70* 65* 62*  CREATININE 3.09*   < > 3.31* 3.13* 3.18*  CALCIUM 9.0   < > 8.9 8.8* 9.0  PROT 6.6  --   --   --   --   ALBUMIN 3.6  --   --   --   --   AST 26  --   --   --   --   ALT 15  --   --   --   --   ALKPHOS 78  --   --   --   --   BILITOT 0.8  --   --   --   --   GFRNONAA 17*   < > 15* 16* 16*  GFRAA  19*   < > 18* 19* 19*  ANIONGAP 11   < > 12 10 11    < > = values in this interval not displayed.     Hematology Recent Labs  Lab 03/27/18 0247 03/28/18 0517 03/29/18 0448  WBC 8.4 7.6 7.8  RBC 2.92* 2.69* 2.84*  HGB 8.5* 8.2* 8.4*  HCT 25.9* 23.9* 25.3*  MCV 88.7 88.8 89.1  MCH 29.1 30.5 29.6  MCHC 32.8 34.3 33.2  RDW 12.9 12.9 12.7  PLT 198 196 219    Cardiac Enzymes Recent Labs  Lab 03/25/18 1422 03/25/18 2022 03/26/18 0227 03/26/18 1143  TROPONINI 4.16* 7.20* 13.23* 11.79*    Recent Labs  Lab 03/25/18 0824 03/25/18 1056  TROPIPOC 0.40* 1.42*     BNP Recent Labs  Lab 03/25/18 0808  BNP 669.1*      Radiology    Dg Chest 2 View  Result Date: 03/28/2018 CLINICAL DATA:  Shortness of breath.  Pulmonary edema. EXAM: CHEST - 2 VIEW COMPARISON:  03/26/2018, 03/25/2018 and 01/06/2018 FINDINGS: There are bilateral pulmonary infiltrates which appear slightly worse than on the prior study. There are small bilateral pleural effusions, diminished slightly. Heart size is normal. CABG. Pulmonary vascularity is at the upper limits of normal. No acute bone abnormality. IMPRESSION: Overall slight progression pulmonary edema. However, the pleural effusions have diminished. Electronically Signed   By: Lorriane Shire M.D.   On: 03/28/2018 17:51    Patient Profile     85 y.o. male with past medical  history of coronary artery disease status post coronary artery bypass graft, peripheral vascular disease, hypertension, hyperlipidemia, diabetes mellitus, moderate aortic stenosis being evaluated for pulmonary edema and non-ST elevation myocardial infarction.  Assessment & Plan    1. Non-ST elevation myocardial infarction-left heart catheterization with severe coronary disease.  No intervention performed.  Plan is for medical management.  Continue aspirin, statin, Plavix.  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  If he does not have any more of this, he may benefit from beta-blockers in the future.  Echo shows ejection fraction of 50 to 55% with mild hypokinesis inferolaterally.  He has moderate aortic stenosis and mitral insufficiency.   2. Acute diastolic congestive heart failure-continues to have crackles at the bases.  His creatinine has remained stable, just above 3.  I do feel like he needs more diuresis, but with his elevated creatinine and stage V CKD, Rolen Conger leave the decision of when to start Lasix up to his nephrology team.   3. History of moderate aortic stenosis-monitor as on echo.  Outpatient follow-up planned. 4. Chronic stage V kidney disease-renal function is essentially unchanged to this morning.  He does have an AV fistula but no indication for dialysis at this time.  Bassel Gaskill allow nephrology to make a determination on when to restart Lasix. 5. History of severe peripheral vascular disease-aspirin, statin, Plavix 6. Hyperlipidemia-continue statin 7. Hypertension-evaded today but has been much better controlled in the past.  No changes.  Ambulate today and wean O2 as tolerated.  Recheck renal function in a.m.  Possible discharge tomorrow if renal function stable.  Therman Hughlett need follow-up with Dr. Burt Knack.  For questions or updates, please contact [email or web address] Please consult [email or web address] for contact info under        Signed, Teigan Sahli Meredith Leeds, MD  03/29/2018, 8:30 AM

## 2018-03-30 ENCOUNTER — Inpatient Hospital Stay (HOSPITAL_COMMUNITY): Payer: Medicare Other

## 2018-03-30 DIAGNOSIS — R609 Edema, unspecified: Secondary | ICD-10-CM

## 2018-03-30 LAB — CULTURE, BLOOD (ROUTINE X 2)
Culture: NO GROWTH
Culture: NO GROWTH
Special Requests: ADEQUATE
Special Requests: ADEQUATE

## 2018-03-30 LAB — RENAL FUNCTION PANEL
ANION GAP: 12 (ref 5–15)
Albumin: 3 g/dL — ABNORMAL LOW (ref 3.5–5.0)
BUN: 64 mg/dL — ABNORMAL HIGH (ref 8–23)
CO2: 22 mmol/L (ref 22–32)
Calcium: 9.1 mg/dL (ref 8.9–10.3)
Chloride: 105 mmol/L (ref 98–111)
Creatinine, Ser: 3.24 mg/dL — ABNORMAL HIGH (ref 0.61–1.24)
GFR calc Af Amer: 18 mL/min — ABNORMAL LOW (ref >60)
GFR calc non Af Amer: 16 mL/min — ABNORMAL LOW (ref >60)
GLUCOSE: 103 mg/dL — AB (ref 70–99)
Phosphorus: 3.3 mg/dL (ref 2.5–4.6)
Potassium: 3.8 mmol/L (ref 3.5–5.1)
Sodium: 139 mmol/L (ref 135–145)

## 2018-03-30 LAB — GLUCOSE, CAPILLARY
Glucose-Capillary: 125 mg/dL — ABNORMAL HIGH (ref 70–99)
Glucose-Capillary: 143 mg/dL — ABNORMAL HIGH (ref 70–99)
Glucose-Capillary: 130 mg/dL — ABNORMAL HIGH (ref 70–99)

## 2018-03-30 MED ORDER — DARBEPOETIN ALFA 100 MCG/0.5ML IJ SOSY
100.0000 ug | PREFILLED_SYRINGE | Freq: Once | INTRAMUSCULAR | Status: AC
  Administered 2018-03-30: 100 ug via SUBCUTANEOUS
  Filled 2018-03-30: qty 0.5

## 2018-03-30 NOTE — Progress Notes (Signed)
[facility] NEPHROLOGY PROGRESS NOTE  Assessment/ Plan: Pt is a 85 y.o. yo male  CKD5 with baseline creatinine in the mid to high 2's followed by Dr. Marval Regal, CASHD, DM, HLD, HTN with left BCF placed by Dr. 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 Cooperp/wworsening dyspnea and CP.His BUN/Cr was64/3.09in ED.   #Acute on CKD 5 with baseline creatinine around 2.4-2.8: -Patient lost around 7 kg since admission with good diuresis.  His lower extremity edema is better however he is still having dyspnea on exertion and crackles on exam. Repeat chest x-ray consistent with pulmonary edema.  The serum creatinine level is stable 3.2 today.On oral Lasix 40 daily.  Discussed with the patient and his daughters regarding daily weight monitoring. Advised to take extra dose of Lasix if he gains more than 2 lb weight or associated with shortness of breath or cough. -No indication for dialysis, no sign of contrast nephropathy. AV fistula in left upper extremity is maturing. -Monitor BMP, strict ins and out. -If he is being discharged which we are OK with - recommend renal panel in one week and outpatient follow-up. I will arrange  #Acute respiratory distress: Off O2 this AM with good sat.  Recommend ambulatory oxygen saturation assessment.  #Hypertension: Pressure acceptable.  Continue current medication.  #NSTEMI, h/o CABG, status post left heart cath on 1/30, on medical management.  Subjective: Seen and examined at bedside.  He feels good.   Denied chest pain, shortness of breath.  His daughter at bedside.  No O2- had 1100 of UOP with lasix 40 PO Objective Vital signs in last 24 hours: Vitals:   03/29/18 2025 03/29/18 2130 03/29/18 2216 03/30/18 0614  BP: (!) 143/59  (!) 129/56 (!) 141/52  Pulse: 86   84  Resp: 18   17  Temp: 98 F (36.7 C)   98 F (36.7 C)  TempSrc: Oral   Oral  SpO2: 100% 100%  99%  Weight:    65.1 kg  Height:        Weight change: 0.136 kg  Intake/Output Summary (Last 24 hours) at 03/30/2018 0758 Last data filed at 03/30/2018 7253 Gross per 24 hour  Intake 240 ml  Output 1145 ml  Net -905 ml       Labs: Basic Metabolic Panel: Recent Labs  Lab 03/28/18 0517 03/29/18 0448 03/30/18 0354  NA 136 138 139  K 3.6 3.5 3.8  CL 101 105 105  CO2 25 22 22   GLUCOSE 118* 107* 103*  BUN 65* 62* 64*  CREATININE 3.13* 3.18* 3.24*  CALCIUM 8.8* 9.0 9.1  PHOS 3.2  --  3.3   Liver Function Tests: Recent Labs  Lab 03/25/18 0808 03/30/18 0354  AST 26  --   ALT 15  --   ALKPHOS 78  --   BILITOT 0.8  --   PROT 6.6  --   ALBUMIN 3.6 3.0*   No results for input(s): LIPASE, AMYLASE in the last 168 hours. No results for input(s): AMMONIA in the last 168 hours. CBC: Recent Labs  Lab 03/25/18 0808 03/26/18 0227 03/27/18 0247 03/28/18 0517 03/29/18 0448  WBC 10.0 9.4 8.4 7.6 7.8  HGB 9.0* 9.0* 8.5* 8.2* 8.4*  HCT 27.4* 26.6* 25.9* 23.9* 25.3*  MCV 92.6 88.7 88.7 88.8 89.1  PLT 191 218 198 196 219   Cardiac Enzymes: Recent Labs  Lab 03/25/18 1422 03/25/18 2022 03/26/18 0227 03/26/18 1143  TROPONINI 4.16* 7.20*  13.23* 11.79*   CBG: Recent Labs  Lab 03/28/18 1647 03/28/18 2127 03/29/18 0753 03/29/18 1118 03/29/18 1632  GLUCAP 110* 138* 108* 132* 117*    Iron Studies: No results for input(s): IRON, TIBC, TRANSFERRIN, FERRITIN in the last 72 hours. Studies/Results: Dg Chest 2 View  Result Date: 03/28/2018 CLINICAL DATA:  Shortness of breath.  Pulmonary edema. EXAM: CHEST - 2 VIEW COMPARISON:  03/26/2018, 03/25/2018 and 01/06/2018 FINDINGS: There are bilateral pulmonary infiltrates which appear slightly worse than on the prior study. There are small bilateral pleural effusions, diminished slightly. Heart size is normal. CABG. Pulmonary vascularity is at the upper limits of normal. No acute bone abnormality. IMPRESSION: Overall slight progression pulmonary edema. However, the pleural  effusions have diminished. Electronically Signed   By: Lorriane Shire M.D.   On: 03/28/2018 17:51    Medications: Infusions:   Scheduled Medications: . amLODipine  10 mg Oral QPM  . aspirin EC  81 mg Oral Daily  . atorvastatin  40 mg Oral QPM  . calcium acetate  667 mg Oral BID WC  . clopidogrel  75 mg Oral Daily  . ferrous gluconate  325 mg Oral Daily  . furosemide  40 mg Oral Daily  . hydrALAZINE  25 mg Oral BID  . levothyroxine  50 mcg Oral QAC breakfast    have reviewed scheduled and prn medications.  Physical Exam: General:NAD, comfortable Heart:RRR, s1s2 nl Lungs: Bilateral basal crackles, no wheeze Abdomen:soft, Non-tender, non-distended Extremities: No ankle edema Dialysis Access: Left upper extremity AV fistula site wound healing well with good thrill and bruit. Some swelling   Liberta Gimpel A Enmanuel Zufall 03/30/2018,7:58 AM  LOS: 5 days

## 2018-03-30 NOTE — Progress Notes (Signed)
Progress Note  Patient Name: Henry [name] Date of Encounter: 03/30/2018  Primary Cardiologist: Sherren Mocha, MD   Subjective   Feeling well today, though he is certainly weak.  He walked to the desk through the hallway on the floor yesterday without much in the way of shortness of breath.  Inpatient Medications    Scheduled Meds: . amLODipine  10 mg Oral QPM  . aspirin EC  81 mg Oral Daily  . atorvastatin  40 mg Oral QPM  . calcium acetate  667 mg Oral BID WC  . clopidogrel  75 mg Oral Daily  . ferrous gluconate  325 mg Oral Daily  . furosemide  40 mg Oral Daily  . hydrALAZINE  25 mg Oral BID  . levothyroxine  50 mcg Oral QAC breakfast   Continuous Infusions:  PRN Meds: acetaminophen, ondansetron (ZOFRAN) IV, traMADol   Vital Signs    Vitals:   03/29/18 2025 03/29/18 2130 03/29/18 2216 03/30/18 0614  BP: (!) 143/59  (!) 129/56 (!) 141/52  Pulse: 86   84  Resp: 18   17  Temp: 98 F (36.7 C)   98 F (36.7 C)  TempSrc: Oral   Oral  SpO2: 100% 100%  99%  Weight:    65.1 kg  Height:        Intake/Output Summary (Last 24 hours) at 03/30/2018 0840 Last data filed at 03/30/2018 0620 Gross per 24 hour  Intake 240 ml  Output 1145 ml  Net -905 ml   Last 3 Weights 03/30/2018 03/29/2018 03/28/2018  Weight (lbs) 143 lb 8 oz 143 lb 3.2 oz 143 lb 4.8 oz  Weight (kg) 65.091 kg 64.955 kg 65 kg      Telemetry    Sinus rhythm- Personally Reviewed   Physical Exam   GEN: Well nourished, well developed, in no acute distress  HEENT: normal  Neck: no JVD, carotid bruits, or masses Cardiac: RRR; no murmurs, rubs, or gallops,no edema  Respiratory: Bibasilar crackles GI: soft, nontender, nondistended, + BS MS: no deformity or atrophy  Skin: warm and dry Neuro:  Strength and sensation are intact Psych: euthymic mood, full affect     Labs    Chemistry Recent Labs  Lab 03/25/18 0808  03/28/18 0517 03/29/18 0448 03/30/18 0354  NA 136   < > 136 138 139  K 3.7   <  > 3.6 3.5 3.8  CL 106   < > 101 105 105  CO2 19*   < > 25 22 22   GLUCOSE 152*   < > 118* 107* 103*  BUN 64*   < > 65* 62* 64*  CREATININE 3.09*   < > 3.13* 3.18* 3.24*  CALCIUM 9.0   < > 8.8* 9.0 9.1  PROT 6.6  --   --   --   --   ALBUMIN 3.6  --   --   --  3.0*  AST 26  --   --   --   --   ALT 15  --   --   --   --   ALKPHOS 78  --   --   --   --   BILITOT 0.8  --   --   --   --   GFRNONAA 17*   < > 16* 16* 16*  GFRAA 19*   < > 19* 19* 18*  ANIONGAP 11   < > 10 11 12    < > = values in this interval  not displayed.     Hematology Recent Labs  Lab 03/27/18 0247 03/28/18 0517 03/29/18 0448  WBC 8.4 7.6 7.8  RBC 2.92* 2.69* 2.84*  HGB 8.5* 8.2* 8.4*  HCT 25.9* 23.9* 25.3*  MCV 88.7 88.8 89.1  MCH 29.1 30.5 29.6  MCHC 32.8 34.3 33.2  RDW 12.9 12.9 12.7  PLT 198 196 219    Cardiac Enzymes Recent Labs  Lab 03/25/18 1422 03/25/18 2022 03/26/18 0227 03/26/18 1143  TROPONINI 4.16* 7.20* 13.23* 11.79*    Recent Labs  Lab 03/25/18 0824 03/25/18 1056  TROPIPOC 0.40* 1.42*     BNP Recent Labs  Lab 03/25/18 0808  BNP 669.1*      Radiology    Dg Chest 2 View  Result Date: 03/28/2018 CLINICAL DATA:  Shortness of breath.  Pulmonary edema. EXAM: CHEST - 2 VIEW COMPARISON:  03/26/2018, 03/25/2018 and 01/06/2018 FINDINGS: There are bilateral pulmonary infiltrates which appear slightly worse than on the prior study. There are small bilateral pleural effusions, diminished slightly. Heart size is normal. CABG. Pulmonary vascularity is at the upper limits of normal. No acute bone abnormality. IMPRESSION: Overall slight progression pulmonary edema. However, the pleural effusions have diminished. Electronically Signed   By: Lorriane Shire M.D.   On: 03/28/2018 17:51    Patient Profile     85 y.o. male with past medical history of coronary artery disease status post coronary artery bypass graft, peripheral vascular disease, hypertension, hyperlipidemia, diabetes mellitus,  moderate aortic stenosis being evaluated for pulmonary edema and non-ST elevation myocardial infarction.  Assessment & Plan    1. Non-ST elevation myocardial infarction-left heart catheterization with severe coronary artery disease and thus no intervention was performed.  Continue aspirin Plavix, statin.  No beta-blocker due to Mobitz 1 seen.  This may be able to be started down the road.  He does have normal cardiac systolic function with mild inferior hypokinesis. 2. Acute diastolic congestive heart failure-continues to have crackles at the bases.  Is on Lasix per nephrology.  Volume status per nephrology. 3. History of moderate aortic stenosis-outpatient follow-up planned  4. chronic stage V kidney disease-renal function has remained stable.  Plan per nephrology  5. history of severe peripheral vascular disease-continue aspirin, statin, Plavix 6. Hyperlipidemia: Continue statin 7. Hypertension-has been well controlled.  No changes.  We Keitra Carusone consult PT today for assistance in discharge planning.  For questions or updates, please contact Craig Please consult [email or web address] for contact info under        Signed, Delphia Kaylor Meredith Leeds, MD  03/30/2018, 8:40 AM

## 2018-03-30 NOTE — Progress Notes (Signed)
Physical Therapy Treatment Patient Details Name: Henry [name] MRN: [id number] DOB: 26-Mar-1924 Today's Date: 03/30/2018    History of Present Illness 85 y.o. male w/ a hx of CKD stage V who has AV fistula in place in anticipation of dialysis initiation, CAD, DM2, HLD, HTN, and anemia of chronic disease who presented with 2 days of worsening shortness of breath and chest discomfort. NSTEMI with medical management as well as CHF exacerbation.      PT Comments    Patient progressing well towards PT goals. Pt with new pitting edema 2+ LUE, RN notified. Encouraged elevation on pillows and salt reduction. Tolerated gait training with and without RW, requiring Min A without DME due to balance deficits and supervision with RW. Tolerated stair training with Min A to descend stairs and Min guard to ascend stairs. Discussed safety at home with pt/daughter re: bed placement, assist needed with stairs at home, use of rollator. Daughter reports pt will have supervision for 1 week or so at d/c. Recommend hands on assist for stair negotiation for the first few days at home and minimizing this initially to twice/day for safety. Daughter agrees. VSS throughout. Will plan for stair training next session prior to d/c. Will follow.   Follow Up Recommendations  Home health PT;Supervision - Intermittent(maximize HH services)     Equipment Recommendations  Other (comment)(rollator)    Recommendations for Other Services       Precautions / Restrictions Precautions Precautions: Fall Restrictions Weight Bearing Restrictions: No    Mobility  Bed Mobility Overal bed mobility: Independent             General bed mobility comments: Up in chair upon PT arrival.   Transfers Overall transfer level: Needs assistance Equipment used: Rolling walker (2 wheeled);None Transfers: Sit to/from Stand Sit to Stand: Supervision         General transfer comment: SUpervision for safety. Stood from Metallurgist.  Ambulation/Gait Ambulation/Gait assistance: Min guard;Min assist Gait Distance (Feet): 350 Feet(+ 150') Assistive device: Rolling walker (2 wheeled);None Gait Pattern/deviations: Step-through pattern;Decreased stride length;Drifts right/left Gait velocity: decreased   General Gait Details: Slow, steady gait using RW; mildly unsteady without DME esp with turns requiring Min A for support. HR stable. No dizziness. Sp02 remained in 90s on RA.    Stairs Stairs: Yes Stairs assistance: Min assist;Min guard Stair Management: One rail Right;Step to pattern Number of Stairs: 11 General stair comments: Cues for technique/safety. Min A to descend and Min guard to ascend.   Wheelchair Mobility    Modified Rankin (Stroke Patients Only)       Balance Overall balance assessment: Needs assistance Sitting-balance support: No upper extremity supported;Feet supported Sitting balance-Leahy Scale: Fair     Standing balance support: During functional activity Standing balance-Leahy Scale: Fair Standing balance comment: Able to stand and use urinal in bathroom without support. Reaching for furniture for support without RW.                             Cognition Arousal/Alertness: Awake/alert Behavior During Therapy: WFL for tasks assessed/performed Overall Cognitive Status: Within Functional Limits for tasks assessed                                        Exercises      General Comments General comments (skin integrity, edema, etc.): 2 daughters  present during session. 2+ pitting edema noted LUE      Pertinent Vitals/Pain Pain Assessment: No/denies pain    Home Living                      Prior Function            PT Goals (current goals can now be found in the care plan section) Acute Rehab PT Goals Patient Stated Goal: to go home Progress towards PT goals: Progressing toward goals    Frequency    Min 3X/week      PT Plan  Current plan remains appropriate    Co-evaluation              AM-PAC PT "6 Clicks" Mobility   Outcome Measure  Help needed turning from your back to your side while in a flat bed without using bedrails?: None Help needed moving from lying on your back to sitting on the side of a flat bed without using bedrails?: None Help needed moving to and from a bed to a chair (including a wheelchair)?: None Help needed standing up from a chair using your arms (e.g., wheelchair or bedside chair)?: A Little Help needed to walk in hospital room?: A Little Help needed climbing 3-5 steps with a railing? : A Little 6 Click Score: 21    End of Session Equipment Utilized During Treatment: Gait belt Activity Tolerance: Patient tolerated treatment well Patient left: in chair;with call bell/phone within reach;with family/visitor present Nurse Communication: Mobility status PT Visit Diagnosis: Muscle weakness (generalized) (M62.81)     Time: 1430-1500 PT Time Calculation (min) (ACUTE ONLY): 30 min  Charges:  $Gait Training: 23-37 mins                     Wray Kearns, PT, DPT Acute Rehabilitation Services Pager [phone] Office [phone]       Marguarite Arbour A Sabra Heck 03/30/2018, 3:05 PM

## 2018-03-30 NOTE — Progress Notes (Addendum)
Henry [name]  JHE:[id number] DOB: 1924/05/28 DOA: 03/25/2018 PCP: [name], MD    Brief Narrative:  85 y.o.malew/ a hx of CKD stage V who has AV fistula in place in anticipation of dialysis initiation, CAD, DM2, HLD, HTN, and anemia of chronic disease who presented with 2 days of worsening shortness of breath and chest discomfort.  Chest x-ray showed pulmonary edema right greater than left.  Doppler of the lower extremities was negative.  He was given IV Lasix/IV Levaquin and admitted with cardiology/nephrology consultations.  Since admission, patient underwent VQ scan which was negative for pulmonary embolism.  Troponin peaked to 13 during the hospital course.    Subjective: Has been up ambulating in the hall a good bit over the last 24 hours.  Denies chest pain shortness of breath fevers or chills.  Has noted significant orthostatic symptoms over the last 24 hours with increased movement.  Has developed left upper extremity swelling acutely today.  There is no pain in the hand or numbness of the fingers.  There is no apparent ecchymosis of the extremity.  Assessment & Plan:  NSTEMI Management per Cardiology w/ plan for medical tx only - Plavix added to ASA and statin - no BB due to AVB - cath 1/30 noted the following: Severe native 3 vessel disease with total occlusion of the RCA and LAD, severe stenosis of the left circumflex, S/P remote CABG with continued patency of the LIMA-LAD and SVG-diagonal, total occlusion of the SVG-RCA and SVG-PDA  Acute respiratory failure - Diastolic CHF exacerbation - Pulmonary edema  Status post IV diuresis - net negative ~4.2L  - sats 90% or > w/ ambulation w/ PT  today  Filed Weights   03/28/18 0327 03/29/18 0448 03/30/18 1856  Weight: 65 kg 65 kg 65.1 kg    L UE edema  Had AV fistula placed for HD 1/23 per Dr. Trula Slade - no evidence of impaired blood flow - rule out DVT - if persists will ask VVS to see  in hospital   Mod AoS Stable per TTE - f/u w/ Cards as outpt   Acute on CKD stage V patient nearing dialysis - care per Nephrology - crt stable - resumed oral Lasix 2/1 - patient being directed on a weight-based extra Lasix dosing strategy for use at home  Recent Labs  Lab 03/26/18 0227 03/27/18 0247 03/28/18 0517 03/29/18 0448 03/30/18 0354  CREATININE 2.92* 3.31* 3.13* 3.18* 3.24*     Hypertension BP stable presently   Diabetes mellitus CBG well controlled - A1c 6.1  Anemia of chronic disease Hgb holding steady - transfuse for Hgb < 8.0  Hypothyroidism Cont Synthroid  DVT prophylaxis: SCDs Code Status: FULL CODE Family Communication: spoke w/ daughters at bedside  Disposition Plan: plan for d/c home 2/3   Consultants:  Cardiology  Antimicrobials:  None    Objective: Blood pressure (!) 144/58, pulse 84, temperature 98 F (36.7 C), temperature source Oral, resp. rate 17, height 5\' 9"  (1.753 m), weight 65.1 kg, SpO2 99 %.  Intake/Output Summary (Last 24 hours) at 03/30/2018 1427 Last data filed at 03/30/2018 1300 Gross per 24 hour  Intake 240 ml  Output 970 ml  Net -730 ml   Filed Weights   03/28/18 0327 03/29/18 0448 03/30/18 0614  Weight: 65 kg 65 kg 65.1 kg    Examination: General: No acute respiratory distress Lungs: CTA th/o - no wheezing or crackles  Cardiovascular: RRR w/  stable systolic M  Abdomen: NT/ND, soft, bs+, no mass  Extremities: 2+ L UE edema w/o eythema - no calor or paraesthesias or paralysis   CBC: Recent Labs  Lab 03/27/18 0247 03/28/18 0517 03/29/18 0448  WBC 8.4 7.6 7.8  HGB 8.5* 8.2* 8.4*  HCT 25.9* 23.9* 25.3*  MCV 88.7 88.8 89.1  PLT 198 196 409   Basic Metabolic Panel: Recent Labs  Lab 03/28/18 0517 03/29/18 0448 03/30/18 0354  NA 136 138 139  K 3.6 3.5 3.8  CL 101 105 105  CO2 25 22 22   GLUCOSE 118* 107* 103*  BUN 65* 62* 64*  CREATININE 3.13* 3.18* 3.24*  CALCIUM 8.8* 9.0 9.1  MG 2.0  --   --   PHOS  3.2  --  3.3   GFR: Estimated Creatinine Clearance: 13.7 mL/min (A) (by C-G formula based on SCr of 3.24 mg/dL (H)).  Liver Function Tests: Recent Labs  Lab 03/25/18 0808 03/30/18 0354  AST 26  --   ALT 15  --   ALKPHOS 78  --   BILITOT 0.8  --   PROT 6.6  --   ALBUMIN 3.6 3.0*    Coagulation Profile: Recent Labs  Lab 03/25/18 0808  INR 1.13    Cardiac Enzymes: Recent Labs  Lab 03/25/18 1422 03/25/18 2022 03/26/18 0227 03/26/18 1143  TROPONINI 4.16* 7.20* 13.23* 11.79*    HbA1C: Hgb A1c MFr Bld  Date/Time Value Ref Range Status  03/28/2018 05:17 AM 6.1 (H) 4.8 - 5.6 % Final    Comment:    (NOTE) Pre diabetes:          5.7%-6.4% Diabetes:              >6.4% Glycemic control for   <7.0% adults with diabetes   01/01/2018 01:18 PM 6.2 4.6 - 6.5 % Final    Comment:    Glycemic Control Guidelines for People with Diabetes:Non Diabetic:  <6%Goal of Therapy: <7%Additional Action Suggested:  >8%     CBG: Recent Labs  Lab 03/28/18 2127 03/29/18 0753 03/29/18 1118 03/29/18 1632 03/30/18 1104  GLUCAP 138* 108* 132* 117* 125*    Recent Results (from the past 240 hour(s))  Blood culture (routine x 2)     Status: None   Collection Time: 03/25/18 10:24 AM  Result Value Ref Range Status   Specimen Description BLOOD RIGHT HAND  Final   Special Requests   Final    BOTTLES DRAWN AEROBIC AND ANAEROBIC Blood Culture adequate volume   Culture NO GROWTH 5 DAYS  Final   Report Status 03/30/2018 FINAL  Final  Blood culture (routine x 2)     Status: None   Collection Time: 03/25/18 10:29 AM  Result Value Ref Range Status   Specimen Description BLOOD RIGHT ANTECUBITAL  Final   Special Requests   Final    BOTTLES DRAWN AEROBIC AND ANAEROBIC Blood Culture adequate volume   Culture NO GROWTH 5 DAYS  Final   Report Status 03/30/2018 FINAL  Final  MRSA PCR Screening     Status: None   Collection Time: 03/25/18  6:20 PM  Result Value Ref Range Status   MRSA by PCR  NEGATIVE NEGATIVE Final    Comment:        The GeneXpert MRSA Assay (FDA approved for NASAL specimens only), is one component of a comprehensive MRSA colonization surveillance program. It is not intended to diagnose MRSA infection nor to guide or monitor treatment for MRSA infections. Performed at  [facility], [place] [address] [place], [place] [address]      Scheduled Meds: . amLODipine  10 mg Oral QPM  . aspirin EC  81 mg Oral Daily  . atorvastatin  40 mg Oral QPM  . calcium acetate  667 mg Oral BID WC  . clopidogrel  75 mg Oral Daily  . ferrous gluconate  325 mg Oral Daily  . furosemide  40 mg Oral Daily  . hydrALAZINE  25 mg Oral BID  . levothyroxine  50 mcg Oral QAC breakfast     LOS: 5 days   Cherene Altes, MD [facility] Office  [phone] Pager - Text Page per Amion  If 7PM-7AM, please contact night-coverage per Amion 03/30/2018, 2:27 PM

## 2018-03-30 NOTE — Progress Notes (Signed)
VASCULAR LAB PRELIMINARY  PRELIMINARY  PRELIMINARY  PRELIMINARY  Left upper extremity venous duplex completed.    Preliminary report:  See CV Proc  Sharion Dove, RVT 03/30/2018, 4:27 PM

## 2018-03-30 NOTE — Progress Notes (Signed)
Occupational Therapy Treatment Patient Details Name: Henry [name] MRN: [id number] DOB: 1924-12-27 Today's Date: 03/30/2018    History of present illness 85 y.o. male w/ a hx of CKD stage V who has AV fistula in place in anticipation of dialysis initiation, CAD, DM2, HLD, HTN, and anemia of chronic disease who presented with 2 days of worsening shortness of breath and chest discomfort. NSTEMI with medical management as well as CHF exacerbation.     OT comments  Pt is a 85 yo male s/p above dx. Pt performing task standing at sink and ambulating 350' with RW.  Pt completing transfer to commode with use of grabs with supervisionA; pt stood at sink x2 times for grooming tasks with no LOB, but slight dizziness reported. BP taken 150/88. Pt supervisionA for ADLs at this time. Pt has supportive family at home. Pt would benefit from continued OT skilled services for ADL, mobility and safety in [place] setting.    Follow Up Recommendations  Home health OT;Supervision/Assistance - 24 hour    Equipment Recommendations  None recommended by OT    Recommendations for Other Services      Precautions / Restrictions Precautions Precautions: Fall Restrictions Weight Bearing Restrictions: No       Mobility Bed Mobility Overal bed mobility: Independent                Transfers Overall transfer level: Needs assistance Equipment used: Rolling walker (2 wheeled) Transfers: Sit to/from Stand Sit to Stand: Supervision              Balance Overall balance assessment: Needs assistance   Sitting balance-Leahy Scale: Fair       Standing balance-Leahy Scale: Fair                             ADL either performed or assessed with clinical judgement   ADL Overall ADL's : Needs assistance/impaired Eating/Feeding: Modified independent   Grooming: Supervision/safety;Standing   Upper Body Bathing: Supervision/ safety;Sitting;Standing   Lower Body Bathing: Supervison/  safety;Sitting/lateral leans   Upper Body Dressing : Supervision/safety;Sitting   Lower Body Dressing: Supervision/safety;Sit to/from stand   Toilet Transfer: Supervision/safety;Comfort height toilet;Grab bars   Toileting- Clothing Manipulation and Hygiene: Supervision/safety;Sitting/lateral lean;Sit to/from stand       Functional mobility during ADLs: Min guard;Rolling walker General ADL Comments: Pt completing transfer to commode with use of grabs with supervisionA; pt stooda t sink x2 times for grooming tasks with no LOB, but slight dizziness reported. BP taken 150/88. Pt supervisionA for ADLs at this time.      Vision   Vision Assessment?: No apparent visual deficits   Perception     Praxis      Cognition Arousal/Alertness: Awake/alert Behavior During Therapy: WFL for tasks assessed/performed Overall Cognitive Status: Within Functional Limits for tasks assessed                                          Exercises     Shoulder Instructions       General Comments SpO2 levels remained >90% on RA and HR <115 BPM with activity. pt ambulating 350' with RW and supervisionA    Pertinent Vitals/ Pain       Pain Assessment: No/denies pain  Home Living  Prior Functioning/Environment              Frequency  Min 2X/week        Progress Toward Goals  OT Goals(current goals can now be found in the care plan section)  Progress towards OT goals: Progressing toward goals  Acute Rehab OT Goals Patient Stated Goal: to go home OT Goal Formulation: With patient/family Time For Goal Achievement: 04/12/18 Potential to Achieve Goals: Good ADL Goals Pt Will Perform Grooming: with modified independence;standing Pt Will Perform Lower Body Bathing: with modified independence;sit to/from stand Pt Will Perform Lower Body Dressing: with modified independence;sit to/from stand Pt Will Transfer to  Toilet: with modified independence;ambulating Pt Will Perform Toileting - Clothing Manipulation and hygiene: with modified independence Pt Will Perform Tub/Shower Transfer: with modified independence;ambulating;rolling walker  Plan Discharge plan remains appropriate    Co-evaluation                 AM-PAC OT "6 Clicks" Daily Activity     Outcome Measure   Help from another person eating meals?: None Help from another person taking care of personal grooming?: None Help from another person toileting, which includes using toliet, bedpan, or urinal?: None Help from another person bathing (including washing, rinsing, drying)?: A Little Help from another person to put on and taking off regular upper body clothing?: None Help from another person to put on and taking off regular lower body clothing?: None 6 Click Score: 23    End of Session Equipment Utilized During Treatment: Rolling walker  OT Visit Diagnosis: Unsteadiness on feet (R26.81);Muscle weakness (generalized) (M62.81);History of falling (Z91.81)   Activity Tolerance Patient tolerated treatment well   Patient Left in chair;with call bell/phone within reach   Nurse Communication Mobility status        Time: 1230-1300 OT Time Calculation (min): 30 min  Charges: OT General Charges $OT Visit: 1 Visit OT Treatments $Self Care/Home Management : 8-22 mins $Neuromuscular Re-education: 8-22 mins  Darryl Nestle) Marsa Aris OTR/L Acute Rehabilitation Services Pager: [phone] Office: [phone]    Fredda Hammed 03/30/2018, 2:02 PM

## 2018-03-31 DIAGNOSIS — J9601 Acute respiratory failure with hypoxia: Secondary | ICD-10-CM

## 2018-03-31 DIAGNOSIS — I25119 Atherosclerotic heart disease of native coronary artery with unspecified angina pectoris: Secondary | ICD-10-CM

## 2018-03-31 DIAGNOSIS — I951 Orthostatic hypotension: Secondary | ICD-10-CM

## 2018-03-31 LAB — BASIC METABOLIC PANEL
Anion gap: 13 (ref 5–15)
BUN: 67 mg/dL — ABNORMAL HIGH (ref 8–23)
CO2: 22 mmol/L (ref 22–32)
Calcium: 9.1 mg/dL (ref 8.9–10.3)
Chloride: 102 mmol/L (ref 98–111)
Creatinine, Ser: 3.28 mg/dL — ABNORMAL HIGH (ref 0.61–1.24)
GFR calc Af Amer: 18 mL/min — ABNORMAL LOW (ref >60)
GFR calc non Af Amer: 16 mL/min — ABNORMAL LOW (ref >60)
GLUCOSE: 135 mg/dL — AB (ref 70–99)
Potassium: 3.8 mmol/L (ref 3.5–5.1)
Sodium: 137 mmol/L (ref 135–145)

## 2018-03-31 LAB — CBC
HCT: 25.7 % — ABNORMAL LOW (ref 39.0–52.0)
Hemoglobin: 8.6 g/dL — ABNORMAL LOW (ref 13.0–17.0)
MCH: 30.1 pg (ref 26.0–34.0)
MCHC: 33.5 g/dL (ref 30.0–36.0)
MCV: 89.9 fL (ref 80.0–100.0)
Platelets: 229 10*3/uL (ref 150–400)
RBC: 2.86 MIL/uL — ABNORMAL LOW (ref 4.22–5.81)
RDW: 13.1 % (ref 11.5–15.5)
WBC: 9.8 10*3/uL (ref 4.0–10.5)
nRBC: 0 % (ref 0.0–0.2)

## 2018-03-31 LAB — MAGNESIUM: Magnesium: 2.1 mg/dL (ref 1.7–2.4)

## 2018-03-31 LAB — GLUCOSE, CAPILLARY
Glucose-Capillary: 112 mg/dL — ABNORMAL HIGH (ref 70–99)
Glucose-Capillary: 135 mg/dL — ABNORMAL HIGH (ref 70–99)
Glucose-Capillary: 119 mg/dL — ABNORMAL HIGH (ref 70–99)

## 2018-03-31 MED ORDER — SODIUM CHLORIDE 0.9 % IV BOLUS
250.0000 mL | Freq: Once | INTRAVENOUS | Status: AC
  Administered 2018-03-31: 250 mL via INTRAVENOUS

## 2018-03-31 MED ORDER — SODIUM CHLORIDE 0.9 % IV BOLUS: 250.0000 mL | Freq: Once | INTRAVENOUS | Status: DC

## 2018-03-31 MED ORDER — AMLODIPINE BESYLATE 5 MG PO TABS
5.0000 mg | ORAL_TABLET | Freq: Every evening | ORAL | Status: DC
  Administered 2018-03-31 – 2018-04-01 (×2): 5 mg via ORAL
  Filled 2018-03-31 (×2): qty 1

## 2018-03-31 NOTE — Progress Notes (Signed)
Orders received for 250 NS bolus.  Pt was without IV access.  IV access initiated.  IV bolus infusing at this time.

## 2018-03-31 NOTE — Plan of Care (Signed)
  Problem: Clinical Measurements: Goal: Ability to maintain clinical measurements within normal limits will improve Outcome: Progressing   Problem: Pain Managment: Goal: General experience of comfort will improve Outcome: Progressing   

## 2018-03-31 NOTE — Progress Notes (Signed)
Physical Therapy Treatment Patient Details Name: Henry [name] MRN: [id number] DOB: [birth year] Today's Date: 03/31/2018    History of Present Illness 85 y.o. male w/ a hx of CKD stage V who has AV fistula in place in anticipation of dialysis initiation, CAD, DM2, HLD, HTN, and anemia of chronic disease who presented with 2 days of worsening shortness of breath and chest discomfort. NSTEMI with medical management as well as CHF exacerbation.      PT Comments    Patient progressing well towards PT goals. Pt with +orthostatics today and reports feeling "light" throughout session. BP 130/57 pre activity after standing 3 mins and 130/58 post session. Pt with abnormal BP response to exercise. Discussed safety at home esp when feeling "light" and things to do to try to improve these symptoms. Pt verbalizes understanding. Less assist required for stair training today. Reviewed minimizing stair negotiation initially for safety until strength improves. Continue to recommend using RW for safety esp when symptomatic. Will continue to follow and progress as tolerated.   Follow Up Recommendations  Home health PT;Supervision - Intermittent     Equipment Recommendations  Other (comment);Rolling walker with 5" wheels(rollator as well )    Recommendations for Other Services       Precautions / Restrictions Precautions Precautions: Fall;Other (comment) Precaution Comments: orthostatic Restrictions Weight Bearing Restrictions: No    Mobility  Bed Mobility Overal bed mobility: Modified Independent             General bed mobility comments: Able to get into/out of bed with elevated HOB, no assist needed.  Transfers Overall transfer level: Needs assistance Equipment used: None Transfers: Sit to/from Stand Sit to Stand: Supervision;Min guard         General transfer comment: Min guard initially due to no RW for support progressing to supervision for safety. Stood from [employer].  Ambulation/Gait Ambulation/Gait assistance: Min guard Gait Distance (Feet): 300 Feet Assistive device: Rolling walker (2 wheeled) Gait Pattern/deviations: Step-through pattern;Decreased stride length;Drifts right/left Gait velocity: decreased   General Gait Details: Slow, steady gait using RW; mildly unsteady without DME esp with turns requiring Min A for support. HR stable. Reports feeling "light." VSS throughout   Stairs Stairs: Yes Stairs assistance: Min guard Stair Management: Step to pattern;One rail Right Number of Stairs: 11 General stair comments: Cues for technique/safety.    Wheelchair Mobility    Modified Rankin (Stroke Patients Only)       Balance Overall balance assessment: Needs assistance Sitting-balance support: No upper extremity supported;Feet supported Sitting balance-Leahy Scale: Good     Standing balance support: During functional activity Standing balance-Leahy Scale: Fair Standing balance comment: Able to stand and use urinal in bathroom without support. 1 LOB posteriorly but able to self correct                            Cognition Arousal/Alertness: Awake/alert Behavior During Therapy: WFL for tasks assessed/performed Overall Cognitive Status: Within Functional Limits for tasks assessed                                        Exercises      General Comments General comments (skin integrity, edema, etc.): Daughter present during session. Edema improved in LUE      Pertinent Vitals/Pain Pain Assessment: No/denies pain    Home Living  Prior Function            PT Goals (current goals can now be found in the care plan section) Progress towards PT goals: Progressing toward goals    Frequency    Min 3X/week      PT Plan Current plan remains appropriate    Co-evaluation              AM-PAC PT "6 Clicks" Mobility   Outcome Measure  Help needed turning from  your back to your side while in a flat bed without using bedrails?: None Help needed moving from lying on your back to sitting on the side of a flat bed without using bedrails?: None Help needed moving to and from a bed to a chair (including a wheelchair)?: None Help needed standing up from a chair using your arms (e.g., wheelchair or bedside chair)?: A Little Help needed to walk in hospital room?: A Little Help needed climbing 3-5 steps with a railing? : A Little 6 Click Score: 21    End of Session Equipment Utilized During Treatment: Gait belt Activity Tolerance: Patient tolerated treatment well Patient left: in bed;with call bell/phone within reach;with family/visitor present Nurse Communication: Mobility status PT Visit Diagnosis: Muscle weakness (generalized) (M62.81)     Time: 4210-3128 PT Time Calculation (min) (ACUTE ONLY): 29 min  Charges:  $Gait Training: 8-22 mins $Therapeutic Activity: 8-22 mins                     Wray Kearns, PT, DPT Acute Rehabilitation Services Pager [phone] Office [phone]       Marguarite Arbour A Sabra Heck 03/31/2018, 12:16 PM

## 2018-03-31 NOTE — Progress Notes (Signed)
[facility] Progress Note    Assessment/ Plan:   #Acute on CKD 5 with baseline creatinine around 2.4-2.8: -Patient lost around 7 kg since admission with good diuresis.  His lower extremity edema is better however he is still having dyspnea on exertion and crackles on exam. Repeat chest x-ray consistent with pulmonary edema.  The serum creatinine level is stable today.Lasix held this AM for sig orthostasis and he got a small fluid bolus.  Will likely need to have Lasix restarted tomorrow but will see how pt feels first.   -No indication for dialysis, no sign of contrast nephropathy. AV fistula in left upper extremity is maturing. -Monitor BMP, strict ins and out. -sees dr Marval Regal as OP. Will arrange f/u  #Acute respiratory distress: Off O2 this AM with good sat.  Recommend ambulatory oxygen saturation assessment.  #Hypertension: Pressure acceptable.  Continue current medication.  #NSTEMI, h/o CABG, status post left heart cath on 1/30, on medical management.  Subjective:    Sig orthostasis this AM.  Lasix held, got a small 250 mL bolus.  Feeling better.   Objective:   BP (!) 144/57 (BP Location: Left Arm)   Pulse 83   Temp 98.7 F (37.1 C) (Oral)   Resp (!) 21   Ht 5\' 9"  (1.753 m)   Wt 64.8 kg   SpO2 97%   BMI 21.09 kg/m   Intake/Output Summary (Last 24 hours) at 03/31/2018 1625 Last data filed at 03/31/2018 0700 Gross per 24 hour  Intake -  Output 875 ml  Net -875 ml   Weight change: -0.318 kg  Physical Exam: Gen: older gentleman, NAD CVS: RRR no m/r/g Resp: bibasilar crackles Abd: soft nontender NABS Ext: no LE edema  Imaging: Vas [place] Upper Extremity Venous Duplex  Result Date: 03/30/2018 UPPER VENOUS STUDY  Indications: Edema Other Indications: Left braciovephalic fistula placed 8/36/6294. Limitations: Edema. Comparison Study: Prior negative LUE venous study from 03/25/2018, is available                   for comparison. Performing Technologist:  Sharion Dove RVS  Examination Guidelines: A complete evaluation includes B-mode imaging, spectral Doppler, color Doppler, and power Doppler as needed of all accessible portions of each vessel. Bilateral testing is considered an integral part of a complete examination. Limited examinations for reoccurring indications may be performed as noted.  Right Findings: +----------+------------+----------+---------+-----------+-------+ RIGHT     CompressiblePropertiesPhasicitySpontaneousSummary +----------+------------+----------+---------+-----------+-------+ Subclavian                         Yes       Yes            +----------+------------+----------+---------+-----------+-------+  Left Findings: +----------+------------+----------+---------+-----------+-------+ LEFT      CompressiblePropertiesPhasicitySpontaneousSummary +----------+------------+----------+---------+-----------+-------+ IJV                                Yes       Yes            +----------+------------+----------+---------+-----------+-------+ Subclavian                         Yes       Yes            +----------+------------+----------+---------+-----------+-------+ Axillary                           Yes  Yes            +----------+------------+----------+---------+-----------+-------+ Brachial                           Yes       Yes            +----------+------------+----------+---------+-----------+-------+ Radial        Full                                          +----------+------------+----------+---------+-----------+-------+ Ulnar         Full                                          +----------+------------+----------+---------+-----------+-------+ Cephalic      Full                                          +----------+------------+----------+---------+-----------+-------+ Basilic       Full                                           +----------+------------+----------+---------+-----------+-------+  Summary:  Right: No evidence of deep vein thrombosis in the upper extremity. No evidence of superficial vein thrombosis in the upper extremity.  Left: No evidence of thrombosis in the subclavian.  *See table(s) above for measurements and observations.    Preliminary     Labs: BMET Recent Labs  Lab 03/25/18 3300 03/26/18 0227 03/27/18 0247 03/28/18 0517 03/29/18 0448 03/30/18 0354 03/31/18 0316  NA 136 138 137 136 138 139 137  K 3.7 3.4* 3.2* 3.6 3.5 3.8 3.8  CL 106 104 102 101 105 105 102  CO2 19* 20* 23 25 22 22 22   GLUCOSE 152* 148* 118* 118* 107* 103* 135*  BUN 64* 64* 70* 65* 62* 64* 67*  CREATININE 3.09* 2.92* 3.31* 3.13* 3.18* 3.24* 3.28*  CALCIUM 9.0 9.2 8.9 8.8* 9.0 9.1 9.1  PHOS  --   --   --  3.2  --  3.3  --    CBC Recent Labs  Lab 03/27/18 0247 03/28/18 0517 03/29/18 0448 03/31/18 0316  WBC 8.4 7.6 7.8 9.8  HGB 8.5* 8.2* 8.4* 8.6*  HCT 25.9* 23.9* 25.3* 25.7*  MCV 88.7 88.8 89.1 89.9  PLT 198 196 219 229    Medications:    . amLODipine  5 mg Oral QPM  . aspirin EC  81 mg Oral Daily  . atorvastatin  40 mg Oral QPM  . calcium acetate  667 mg Oral BID WC  . clopidogrel  75 mg Oral Daily  . ferrous gluconate  325 mg Oral Daily  . hydrALAZINE  25 mg Oral BID  . levothyroxine  50 mcg Oral QAC breakfast      Madelon Lips, MD [facility] pgr [phone] 03/31/2018, 4:25 PM

## 2018-03-31 NOTE — Progress Notes (Addendum)
Progress Note  Patient Name: Henry [name] Date of Encounter: 03/31/2018  Primary Cardiologist: Sherren Mocha, MD   Subjective   Patient denies chest pain or palpitations.  He still has very mild dyspnea with exertion like walking in the hall.  His biggest complaint is dizziness with ambulation.  Inpatient Medications    Scheduled Meds: . amLODipine  10 mg Oral QPM  . aspirin EC  81 mg Oral Daily  . atorvastatin  40 mg Oral QPM  . calcium acetate  667 mg Oral BID WC  . clopidogrel  75 mg Oral Daily  . ferrous gluconate  325 mg Oral Daily  . furosemide  40 mg Oral Daily  . hydrALAZINE  25 mg Oral BID  . levothyroxine  50 mcg Oral QAC breakfast   Continuous Infusions:  PRN Meds: acetaminophen, ondansetron (ZOFRAN) IV, traMADol   Vital Signs    Vitals:   03/30/18 1715 03/30/18 2057 03/31/18 0500 03/31/18 0815  BP: (!) 150/52 (!) 149/66 (!) 144/65 (!) 144/65  Pulse:  80 91   Resp:  20 18   Temp:  98.1 F (36.7 C) 98.7 F (37.1 C)   TempSrc:  Axillary Oral   SpO2:  96% 95%   Weight:   64.8 kg   Height:        Intake/Output Summary (Last 24 hours) at 03/31/2018 0856 Last data filed at 03/31/2018 0700 Gross per 24 hour  Intake 480 ml  Output 1150 ml  Net -670 ml   Last 3 Weights 03/31/2018 03/30/2018 03/29/2018  Weight (lbs) 142 lb 12.8 oz 143 lb 8 oz 143 lb 3.2 oz  Weight (kg) 64.774 kg 65.091 kg 64.955 kg      Telemetry    Sinus rhythm with first-degree AV block in the 80s- Personally Reviewed  ECG    No new tracings- Personally Reviewed  Physical Exam   GEN:  Well-nourished, well-developed, no acute distress.   Neck: No JVD Cardiac: RRR, 3/6 harsh systolic murmur RUSB and LUSB Respiratory: Clear to auscultation bilaterally except except for bibasilar crackles GI: Soft, nontender, non-distended  MS: No edema; No deformity. Neuro:  Nonfocal  Psych: Normal affect   Labs    Chemistry Recent Labs  Lab 03/25/18 0808  03/29/18 0448 03/30/18 0354  03/31/18 0316  NA 136   < > 138 139 137  K 3.7   < > 3.5 3.8 3.8  CL 106   < > 105 105 102  CO2 19*   < > 22 22 22   GLUCOSE 152*   < > 107* 103* 135*  BUN 64*   < > 62* 64* 67*  CREATININE 3.09*   < > 3.18* 3.24* 3.28*  CALCIUM 9.0   < > 9.0 9.1 9.1  PROT 6.6  --   --   --   --   ALBUMIN 3.6  --   --  3.0*  --   AST 26  --   --   --   --   ALT 15  --   --   --   --   ALKPHOS 78  --   --   --   --   BILITOT 0.8  --   --   --   --   GFRNONAA 17*   < > 16* 16* 16*  GFRAA 19*   < > 19* 18* 18*  ANIONGAP 11   < > 11 12 13    < > = values in this  interval not displayed.     Hematology Recent Labs  Lab 03/28/18 0517 03/29/18 0448 03/31/18 0316  WBC 7.6 7.8 9.8  RBC 2.69* 2.84* 2.86*  HGB 8.2* 8.4* 8.6*  HCT 23.9* 25.3* 25.7*  MCV 88.8 89.1 89.9  MCH 30.5 29.6 30.1  MCHC 34.3 33.2 33.5  RDW 12.9 12.7 13.1  PLT 196 219 229    Cardiac Enzymes Recent Labs  Lab 03/25/18 1422 03/25/18 2022 03/26/18 0227 03/26/18 1143  TROPONINI 4.16* 7.20* 13.23* 11.79*    Recent Labs  Lab 03/25/18 0824 03/25/18 1056  TROPIPOC 0.40* 1.42*     BNP Recent Labs  Lab 03/25/18 0808  BNP 669.1*     DDimer No results for input(s): DDIMER in the last 168 hours.   Radiology    Vas [place] Upper Extremity Venous Duplex  Result Date: 03/30/2018 UPPER VENOUS STUDY  Indications: Edema Other Indications: Left braciovephalic fistula placed 02/27/5850. Limitations: Edema. Comparison Study: Prior negative LUE venous study from 03/25/2018, is available                   for comparison. Performing Technologist: Sharion Dove RVS  Examination Guidelines: A complete evaluation includes B-mode imaging, spectral Doppler, color Doppler, and power Doppler as needed of all accessible portions of each vessel. Bilateral testing is considered an integral part of a complete examination. Limited examinations for reoccurring indications may be performed as noted.  Right Findings:  +----------+------------+----------+---------+-----------+-------+ RIGHT     CompressiblePropertiesPhasicitySpontaneousSummary +----------+------------+----------+---------+-----------+-------+ Subclavian                         Yes       Yes            +----------+------------+----------+---------+-----------+-------+  Left Findings: +----------+------------+----------+---------+-----------+-------+ LEFT      CompressiblePropertiesPhasicitySpontaneousSummary +----------+------------+----------+---------+-----------+-------+ IJV                                Yes       Yes            +----------+------------+----------+---------+-----------+-------+ Subclavian                         Yes       Yes            +----------+------------+----------+---------+-----------+-------+ Axillary                           Yes       Yes            +----------+------------+----------+---------+-----------+-------+ Brachial                           Yes       Yes            +----------+------------+----------+---------+-----------+-------+ Radial        Full                                          +----------+------------+----------+---------+-----------+-------+ Ulnar         Full                                          +----------+------------+----------+---------+-----------+-------+  Cephalic      Full                                          +----------+------------+----------+---------+-----------+-------+ Basilic       Full                                          +----------+------------+----------+---------+-----------+-------+  Summary:  Right: No evidence of deep vein thrombosis in the upper extremity. No evidence of superficial vein thrombosis in the upper extremity.  Left: No evidence of thrombosis in the subclavian.  *See table(s) above for measurements and observations.    Preliminary     Cardiac Studies   LEFT HEART CATH AND  CORS/GRAFTS ANGIOGRAPHY 03/27/18  Conclusion    Ost RCA to Prox RCA lesion is 100% stenosed.  Origin to Prox Graft lesion is 100% stenosed.  Origin lesion is 100% stenosed.  Prox LAD lesion is 100% stenosed.  Ost 1st Mrg lesion is 90% stenosed.   1. Severe native 3 vessel disease with total occlusion of the RCA and LAD, severe stenosis of the left circumflex 2. S/P remote CABG with continued patency of the LIMA-LAD and SVG-diagonal, total occlusion of the SVG-RCA and SVG-PDA  Recommend medical management of CAD. No clear culprit lesion. Both SVG occlusions appear chronic. No targets for PCI.     Echocardiogram 03/26/2018 IMPRESSIONS   1. The left ventricle appears to be normal in size, has normal wall thickness 50-55% ejection fraction Spectral Doppler shows pseudonormal pattern of diastolic filling.  2. Probable basal inferolateral and lateral hypokinesis.  3. Right ventricular systolic pressure is is normal.  4. The right ventricle has normal size and normal systolic function.  5. Normal left atrial size.  6. Normal right atrial size.  7. Mitral valve regurgitation is mild to moderate by color flow Doppler.  8. Normal tricuspid valve.  9. Aortic valve regurgitation is mild by color flow Doppler. 10. Moderate stenosis of the aortic valve. 11. There is little change in the aortic stenosis severity, but there appears to be a new limited area of hypokinesis in the left circumflex artery distribution. 12. No atrial level shunt detected by color flow Doppler. 13. Aortic valve tricuspid. 14. The mitral valve is degenerative.   Patient Profile     85 y.o. male with past medical history of coronary artery disease status post coronary artery bypass graft, peripheral vascular disease, hypertension, hyperlipidemia, diabetes mellitus, moderate aortic stenosis being evaluated for pulmonary edema and non-ST elevation myocardial infarction.  Assessment & Plan    1.  Non-ST  elevation myocardial infarction -Left heart cath on 03/27/2018 showed severe coronary artery disease with total occlusion of RCA and LAD, severe stenosis of the left circumflex.  Status post CABG with continued patency of the LIMA-LAD daily and SVG-diagonal.  Total occlusion of the SVG-RCA and SVG-PDA.  No targets for PCI. -Continue aspirin, Plavix, statin.  No beta-blocker due to some Mobitz 1 AV block seen.  Currently he is in first-degree AV block.  Per Dr. Curt Bears could possibly start beta-blocker down the road.  2.  Acute diastolic CHF -Echocardiogram showed normal LV systolic function, EF 69-48%, with  Probable basal inferolateral and lateral hypokinesis. -Lungs are mostly clear except for bibasilar crackles.  He has no edema.  Mild dyspnea on exertion, patient states almost back to baseline. -Patient is on Lasix per nephrology, now on oral dosing 40 mg daily.  Volume management per nephrology.  1.1 L urine output yesterday.  Patient is -5 L fluid balance since admission. -Patient's biggest complaint is dizziness with ambulation.  I have ordered orthostatic vital signs and check BP with ambulation if he becomes dizzy. -Discussed fluid restriction and low-sodium diet.  Patient's daughter is in the room and very involved in his care. -Patient may be ready for discharge today.  Dr. Acie Fredrickson to see.  3.  Acute on chronic stage V kidney disease -Baseline creatinine 2.4-2.8. -Nephrology is following. -Serum creatinine 3.28 which seems to be stable.  4.  Hypertension -Blood pressure is acceptable.  No changes.  5.  History of moderate aortic stenosis -Outpatient follow-up planned  6.  Hyperlipidemia -Continue statin      For questions or updates, please contact [place] Please consult [email or web address] for contact info under        Signed, Daune Perch, NP  03/31/2018, 8:56 AM    Attending Note:   The patient was seen and examined.  Agree with assessment and plan as noted above.   Changes made to the above note as needed.  Patient seen and independently examined with Pecolia Ades, NP .   We discussed all aspects of the encounter. I agree with the assessment and plan as stated above.  1.  CAD :   He is not had any recent episodes of angina.  2.  Dizziness: The patient was on several medications that could be causing orthostatic hypotension.  Discussed the case with Dr. Thereasa Solo. He has decreased his Lasix dosing.  I discussed with the patient and his daughter that this may cause his blood pressure to go up slightly but at this point I think that it is important that we get him to where he can walk safely and without dizziness.    3.  Orthostatic hypotension:   Will try to minimize meds that can cause orthostasis. May need a lower dose of amlodipine     I have spent a total of 40 minutes with patient reviewing hospital  notes , telemetry, EKGs, labs and examining patient as well as establishing an assessment and plan that was discussed with the patient. > 50% of time was spent in direct patient care.    Thayer Headings, Brooke Bonito., MD, [facility] 03/31/2018, 11:32 AM [address],  [place] Pager [phone]

## 2018-03-31 NOTE — Progress Notes (Signed)
[facility] TEAM 1 - Stepdown/ICU TEAM  AMADOU KATZENSTEIN  ENI:[id number] DOB: [birth year] DOA: 03/25/2018 PCP: [name], MD    Brief Narrative:  85 y.o.malew/ a hx of CKD stage V who has AV fistula in place in anticipation of dialysis initiation, CAD, DM2, HLD, HTN, and anemia of chronic disease who presented with 2 days of worsening shortness of breath and chest discomfort.  Chest x-ray showed pulmonary edema right greater than left.  Doppler of the lower extremities was negative.  He was given IV Lasix/IV Levaquin and admitted with cardiology/nephrology consultations.  Since admission, patient underwent VQ scan which was negative for pulmonary embolism.  Troponin peaked to 13 during the hospital course.    Subjective: The patient denies chest pain or shortness of breath.  He continues to have significant dizziness with postural changes.  This is interfering with his ability to ambulate independently.  He continues to have some swelling in his left arm as well.  His appetite is good.  He denies abdominal pain nausea or vomiting.  Assessment & Plan:  NSTEMI Management per Cardiology w/ plan for medical tx only - Plavix added to ASA and statin - no BB due to AVB - cath 1/30 noted the following: Severe native 3 vessel disease with total occlusion of the RCA and LAD, severe stenosis of the left circumflex, S/P remote CABG with continued patency of the LIMA-LAD and SVG-diagonal, total occlusion of the SVG-RCA and SVG-PDA  Acute respiratory failure - Diastolic CHF exacerbation - Pulmonary edema  Status post IV diuresis - net negative ~5L  - sats 90% or > w/ ambulation w/ PT - no significant edema on exam with exception to his left upper extremity -clinically he appears to be a bit over diuresed/dehydrated at this time -will hold on scheduled diuresis for now and watch I's/O's and daily weights closely -we must find a middle ground that prevents the patient from being dizzy but also allows [place] to  avoid respiratory difficulty -I feel like we have room for modification as the patient's breathing has certainly stabilized at this time   [facility] Weights   03/29/18 0448 03/30/18 0614 03/31/18 0500  Weight: 65 kg 65.1 kg 64.8 kg    L UE edema  Had AV fistula placed for HD 1/23 per Dr. Trula Slade - no evidence of impaired blood flow - ruled out DVT with venous duplex -will discuss with VVS  Mod AoS Stable per TTE - f/u w/ Cards as outpt   Acute on CKD stage V patient nearing dialysis - care per Nephrology - crt stable -at this time the patient appears to simply be too symptomatic with ongoing diuretic use -attempt to let his volume expanded slightly by holding oral diuretic -he may be rapidly approaching the point where volume control will be exceedingly difficult in the setting of CKD, diastolic heart failure, and aortic stenosis  Recent Labs  Lab 03/27/18 0247 03/28/18 0517 03/29/18 0448 03/30/18 0354 03/31/18 0316  CREATININE 3.31* 3.13* 3.18* 3.24* 3.28*     Hypertension BP stable presently, but given significant symptomatic orthostasis we will attempt to allow his blood pressure to increase slightly -cardiology agrees with this plan  Diabetes mellitus CBG well controlled - A1c 6.1  Anemia of chronic disease Hgb holding steady - transfuse for Hgb < 8.0  Hypothyroidism Cont Synthroid  DVT prophylaxis: SCDs Code Status: FULL CODE Family Communication: spoke w/ daughter at bedside  Disposition Plan: Not ready for discharge due to severe symptomatic orthostasis  and clinical dehydration  Consultants:  Cardiology  Antimicrobials:  None    Objective: Blood pressure (!) 130/57, pulse 84, temperature 98.7 F (37.1 C), temperature source Oral, resp. rate (!) 22, height 5\' 9"  (1.753 m), weight 64.8 kg, SpO2 95 %.  Intake/Output Summary (Last 24 hours) at 03/31/2018 1148 Last data filed at 03/31/2018 0700 Gross per 24 hour  Intake 240 ml  Output 1150 ml  Net -910 ml    Filed Weights   03/29/18 0448 03/30/18 0614 03/31/18 0500  Weight: 65 kg 65.1 kg 64.8 kg    Examination: General: No acute respiratory distress -alert and oriented Lungs: CTA th/o with no wheezing Cardiovascular: RRR w/ stable systolic M  Abdomen: NT/ND, soft, bs+, no mass  Extremities: 1+ L UE edema w/o eythema -no significant lower extremity edema bilaterally  CBC: Recent Labs  Lab 03/28/18 0517 03/29/18 0448 03/31/18 0316  WBC 7.6 7.8 9.8  HGB 8.2* 8.4* 8.6*  HCT 23.9* 25.3* 25.7*  MCV 88.8 89.1 89.9  PLT 196 219 245   Basic Metabolic Panel: Recent Labs  Lab 03/28/18 0517 03/29/18 0448 03/30/18 0354 03/31/18 0316  NA 136 138 139 137  K 3.6 3.5 3.8 3.8  CL 101 105 105 102  CO2 25 22 22 22   GLUCOSE 118* 107* 103* 135*  BUN 65* 62* 64* 67*  CREATININE 3.13* 3.18* 3.24* 3.28*  CALCIUM 8.8* 9.0 9.1 9.1  MG 2.0  --   --  2.1  PHOS 3.2  --  3.3  --    GFR: Estimated Creatinine Clearance: 13.4 mL/min (A) (by C-G formula based on SCr of 3.28 mg/dL (H)).  Liver Function Tests: Recent Labs  Lab 03/25/18 0808 03/30/18 0354  AST 26  --   ALT 15  --   ALKPHOS 78  --   BILITOT 0.8  --   PROT 6.6  --   ALBUMIN 3.6 3.0*    Coagulation Profile: Recent Labs  Lab 03/25/18 0808  INR 1.13    Cardiac Enzymes: Recent Labs  Lab 03/25/18 1422 03/25/18 2022 03/26/18 0227 03/26/18 1143  TROPONINI 4.16* 7.20* 13.23* 11.79*    HbA1C: Hgb A1c MFr Bld  Date/Time Value Ref Range Status  03/28/2018 05:17 AM 6.1 (H) 4.8 - 5.6 % Final    Comment:    (NOTE) Pre diabetes:          5.7%-6.4% Diabetes:              >6.4% Glycemic control for   <7.0% adults with diabetes   01/01/2018 01:18 PM 6.2 4.6 - 6.5 % Final    Comment:    Glycemic Control Guidelines for People with Diabetes:Non Diabetic:  <6%Goal of Therapy: <7%Additional Action Suggested:  >8%     CBG: Recent Labs  Lab 03/29/18 1632 03/30/18 1104 03/30/18 1633 03/30/18 2105 03/31/18 0721   GLUCAP 117* 125* 143* 130* 112*    Recent Results (from the past 240 hour(s))  Blood culture (routine x 2)     Status: None   Collection Time: 03/25/18 10:24 AM  Result Value Ref Range Status   Specimen Description BLOOD RIGHT HAND  Final   Special Requests   Final    BOTTLES DRAWN AEROBIC AND ANAEROBIC Blood Culture adequate volume   Culture NO GROWTH 5 DAYS  Final   Report Status 03/30/2018 FINAL  Final  Blood culture (routine x 2)     Status: None   Collection Time: 03/25/18 10:29 AM  Result Value Ref Range  Status   Specimen Description BLOOD RIGHT ANTECUBITAL  Final   Special Requests   Final    BOTTLES DRAWN AEROBIC AND ANAEROBIC Blood Culture adequate volume   Culture NO GROWTH 5 DAYS  Final   Report Status 03/30/2018 FINAL  Final  MRSA PCR Screening     Status: None   Collection Time: 03/25/18  6:20 PM  Result Value Ref Range Status   MRSA by PCR NEGATIVE NEGATIVE Final    Comment:        The GeneXpert MRSA Assay (FDA approved for NASAL specimens only), is one component of a comprehensive MRSA colonization surveillance program. It is not intended to diagnose MRSA infection nor to guide or monitor treatment for MRSA infections. Performed at [facility], [place] [address], [place], [place] [address]      Scheduled Meds: . amLODipine  5 mg Oral QPM  . aspirin EC  81 mg Oral Daily  . atorvastatin  40 mg Oral QPM  . calcium acetate  667 mg Oral BID WC  . clopidogrel  75 mg Oral Daily  . ferrous gluconate  325 mg Oral Daily  . furosemide  40 mg Oral Daily  . hydrALAZINE  25 mg Oral BID  . levothyroxine  50 mcg Oral QAC breakfast     LOS: 6 days   Cherene Altes, MD [facility] Office  [phone] Pager - Text Page per Amion  If 7PM-7AM, please contact night-coverage per Amion 03/31/2018, 11:48 AM

## 2018-03-31 NOTE — Consult Note (Signed)
   [facility] Inpatient Consult   03/31/2018  Henry [name] [facility] March 30, 1924 [id number]  This has been reviewed a [facility] referral request and assigned this patient for outreach. [facility] was outreached for post hospital follow up and support.   Patient screened for moderate risk score and hospitalizations to check if potential [place] Management services are needed . Patient was hospitalized for Respiratory Failure with Hypoxia.  Patient confirms his Primary Care Provider is  Dr. Cathlean Cower. This physician office does provide the transition of care follow up.   Patient states that he drives, his is the caregiver for his wife who has Alzheimer Dementia.  Daughters are at the bedside.  Talbert Nan, daughter from [place] states she is the emergency contact her number is [phone]. Explained [facility] Care Management services for complex disease management.  Daughter states that the patient's wife could use the services as well.  Encouraged them to self refer for his wife.  Patient will be followed by community for complex disease and care management services.  Of note, [facility] Care Management does not replace or interfere with services arranged by the inpatient care management staff.  Please place a [facility] Care Management consult or for questions contact:   Natividad Brood, RN BSN [facility] Liaison  [phone] business mobile phone Toll free office [phone]

## 2018-04-01 DIAGNOSIS — I5043 Acute on chronic combined systolic (congestive) and diastolic (congestive) heart failure: Secondary | ICD-10-CM

## 2018-04-01 DIAGNOSIS — I5032 Chronic diastolic (congestive) heart failure: Secondary | ICD-10-CM

## 2018-04-01 LAB — BASIC METABOLIC PANEL
Anion gap: 11 (ref 5–15)
BUN: 71 mg/dL — ABNORMAL HIGH (ref 8–23)
CHLORIDE: 106 mmol/L (ref 98–111)
CO2: 21 mmol/L — ABNORMAL LOW (ref 22–32)
Calcium: 9 mg/dL (ref 8.9–10.3)
Creatinine, Ser: 3.33 mg/dL — ABNORMAL HIGH (ref 0.61–1.24)
GFR calc Af Amer: 18 mL/min — ABNORMAL LOW (ref >60)
GFR calc non Af Amer: 15 mL/min — ABNORMAL LOW (ref >60)
GLUCOSE: 116 mg/dL — AB (ref 70–99)
Potassium: 3.8 mmol/L (ref 3.5–5.1)
Sodium: 138 mmol/L (ref 135–145)

## 2018-04-01 LAB — GLUCOSE, CAPILLARY
Glucose-Capillary: 108 mg/dL — ABNORMAL HIGH (ref 70–99)
Glucose-Capillary: 139 mg/dL — ABNORMAL HIGH (ref 70–99)

## 2018-04-01 MED ORDER — FUROSEMIDE 40 MG PO TABS
40.0000 mg | ORAL_TABLET | Freq: Every day | ORAL | Status: DC
  Administered 2018-04-01 – 2018-04-02 (×2): 40 mg via ORAL
  Filled 2018-04-01 (×2): qty 1

## 2018-04-01 NOTE — Progress Notes (Signed)
Progress Note  Patient Name: Henry [name] Date of Encounter: 04/01/2018  Primary Cardiologist: Sherren Mocha, MD   Subjective   85 year old gentleman who presents with chest pain and shortness of breath.  His troponin levels are consistent with a non-ST segment elevation myocardial infarction.  Catheterization revealed severe native coronary artery disease.  He has a patent LIMA to LAD graft and a saphenous vein graft to the diagonal.  The saphenous vein graft to the right coronary artery and posterior descending artery are both occluded.  He has a severe stenosis in the circumflex artery.  He is not having any episodes of chest discomfort.  Inpatient Medications    Scheduled Meds: . amLODipine  5 mg Oral QPM  . aspirin EC  81 mg Oral Daily  . atorvastatin  40 mg Oral QPM  . calcium acetate  667 mg Oral BID WC  . clopidogrel  75 mg Oral Daily  . ferrous gluconate  325 mg Oral Daily  . furosemide  40 mg Oral Daily  . hydrALAZINE  25 mg Oral BID  . levothyroxine  50 mcg Oral QAC breakfast   Continuous Infusions:  PRN Meds: acetaminophen, ondansetron (ZOFRAN) IV, traMADol   Vital Signs    Vitals:   03/31/18 2122 04/01/18 0434 04/01/18 0435 04/01/18 0844  BP: (!) 138/51 (!) 136/59  138/60  Pulse:  82    Resp:   20   Temp:  98.5 F (36.9 C)    TempSrc:  Oral    SpO2:  95%    Weight:   64.3 kg   Height:        Intake/Output Summary (Last 24 hours) at 04/01/2018 1254 Last data filed at 04/01/2018 0800 Gross per 24 hour  Intake 1221.38 ml  Output 1125 ml  Net 96.38 ml   Last 3 Weights 04/01/2018 03/31/2018 03/30/2018  Weight (lbs) 141 lb 11.2 oz 142 lb 12.8 oz 143 lb 8 oz  Weight (kg) 64.275 kg 64.774 kg 65.091 kg      Telemetry       NSR - Personally Reviewed  ECG     - Personally Reviewed  Physical Exam   GEN:  Elderly gentleman, no acute distress Neck: No JVD Cardiac:  Rate S1-S2.  Soft systolic murmur Respiratory: Bilateral rales one half the way  up. GI: Soft, nontender, non-distended  MS: No edema; No deformity. Neuro:  Nonfocal  Psych: Normal affect   Labs    Chemistry Recent Labs  Lab 03/30/18 0354 03/31/18 0316 04/01/18 0427  NA 139 137 138  K 3.8 3.8 3.8  CL 105 102 106  CO2 22 22 21*  GLUCOSE 103* 135* 116*  BUN 64* 67* 71*  CREATININE 3.24* 3.28* 3.33*  CALCIUM 9.1 9.1 9.0  ALBUMIN 3.0*  --   --   GFRNONAA 16* 16* 15*  GFRAA 18* 18* 18*  ANIONGAP 12 13 11      Hematology Recent Labs  Lab 03/28/18 0517 03/29/18 0448 03/31/18 0316  WBC 7.6 7.8 9.8  RBC 2.69* 2.84* 2.86*  HGB 8.2* 8.4* 8.6*  HCT 23.9* 25.3* 25.7*  MCV 88.8 89.1 89.9  MCH 30.5 29.6 30.1  MCHC 34.3 33.2 33.5  RDW 12.9 12.7 13.1  PLT 196 219 229    Cardiac Enzymes Recent Labs  Lab 03/25/18 1422 03/25/18 2022 03/26/18 0227 03/26/18 1143  TROPONINI 4.16* 7.20* 13.23* 11.79*   No results for input(s): TROPIPOC in the last 168 hours.   BNPNo results for input(s): BNP,  PROBNP in the last 168 hours.   DDimer No results for input(s): DDIMER in the last 168 hours.   Radiology    Vas [place] Upper Extremity Venous Duplex  Result Date: 04/01/2018 UPPER VENOUS STUDY  Indications: Edema Other Indications: Left braciovephalic fistula placed 0/16/0109. Limitations: Edema. Comparison Study: Prior negative LUE venous study from 03/25/2018, is available                   for comparison. Performing Technologist: Sharion Dove RVS  Examination Guidelines: A complete evaluation includes B-mode imaging, spectral Doppler, color Doppler, and power Doppler as needed of all accessible portions of each vessel. Bilateral testing is considered an integral part of a complete examination. Limited examinations for reoccurring indications may be performed as noted.  Right Findings: +----------+------------+----------+---------+-----------+-------+ RIGHT     CompressiblePropertiesPhasicitySpontaneousSummary  +----------+------------+----------+---------+-----------+-------+ Subclavian                         Yes       Yes            +----------+------------+----------+---------+-----------+-------+  Left Findings: +----------+------------+----------+---------+-----------+-------+ LEFT      CompressiblePropertiesPhasicitySpontaneousSummary +----------+------------+----------+---------+-----------+-------+ IJV                                Yes       Yes            +----------+------------+----------+---------+-----------+-------+ Subclavian                         Yes       Yes            +----------+------------+----------+---------+-----------+-------+ Axillary                           Yes       Yes            +----------+------------+----------+---------+-----------+-------+ Brachial                           Yes       Yes            +----------+------------+----------+---------+-----------+-------+ Radial        Full                                          +----------+------------+----------+---------+-----------+-------+ Ulnar         Full                                          +----------+------------+----------+---------+-----------+-------+ Cephalic      Full                                          +----------+------------+----------+---------+-----------+-------+ Basilic       Full                                          +----------+------------+----------+---------+-----------+-------+  Summary:  Right: No evidence  of deep vein thrombosis in the upper extremity. No evidence of superficial vein thrombosis in the upper extremity.  Left: No evidence of thrombosis in the subclavian.  *See table(s) above for measurements and observations.  Diagnosing physician: Ruta Hinds MD Electronically signed by Ruta Hinds MD on 04/01/2018 at 10:24:11 AM.    Final     Cardiac Studies   LEFT HEART CATH AND CORS/GRAFTS ANGIOGRAPHY 03/27/18    Conclusion    Ost RCA to Prox RCA lesion is 100% stenosed.  Origin to Prox Graft lesion is 100% stenosed.  Origin lesion is 100% stenosed.  Prox LAD lesion is 100% stenosed.  Ost 1st Mrg lesion is 90% stenosed.  1. Severe native 3 vessel disease with total occlusion of the RCA and LAD, severe stenosis of the left circumflex 2. S/P remote CABG with continued patency of the LIMA-LAD and SVG-diagonal, total occlusion of the SVG-RCA and SVG-PDA  Recommend medical management of CAD. No clear culprit lesion. Both SVG occlusions appear chronic. No targets for PCI.    Echocardiogram 03/26/2018 IMPRESSIONS  1. The left ventricle appears to be normal in size, has normal wall thickness 50-55% ejection fraction Spectral Doppler shows pseudonormal pattern of diastolic filling. 2. Probable basal inferolateral and lateral hypokinesis. 3. Right ventricular systolic pressure is is normal. 4. The right ventricle has normal size and normal systolic function. 5. Normal left atrial size. 6. Normal right atrial size. 7. Mitral valve regurgitation is mild to moderate by color flow Doppler. 8. Normal tricuspid valve. 9. Aortic valve regurgitation is mild by color flow Doppler. 10. Moderate stenosis of the aortic valve. 11. There is little change in the aortic stenosis severity, but there appears to be a new limited area of hypokinesis in the left circumflex artery distribution. 12. No atrial level shunt detected by color flow Doppler. 13. Aortic valve tricuspid. 14. The mitral valve is degenerative.  Patient Profile     85 y.o. male  with past medical history of coronary artery disease status post coronary artery bypass graft, peripheral vascular disease, hypertension, hyperlipidemia, diabetes mellitus, moderate aortic stenosis being evaluated for pulmonary edema and non-ST elevation myocardial infarction.  Assessment & Plan    1.  Non-ST elevation myocardial infarction -Left  heart cath on 03/27/2018 showed severe coronary artery disease with total occlusion of RCA and LAD, severe stenosis of the left circumflex.  Status post CABG with continued patency of the LIMA-LAD daily and SVG-diagonal.  Total occlusion of the SVG-RCA and SVG-PDA.  No targets for PCI. -Continue aspirin, Plavix, statin.  No beta-blocker due to some Mobitz 1 AV block seen.  Currently he is in first-degree AV block.  Per Dr. Curt Bears could possibly start beta-blocker down the road.  2.  Acute diastolic CHF -Echocardiogram showed normal LV systolic function, EF 79-39%, with  Probable basal inferolateral and lateral hypokinesis. -Diuresis per nephrology. Lasix held yesterday due to symptomatic orthostasis.  Restart Lasix today     3. CKD stage 5 -See Dr. Marval Regal as OP. Followed by nephrology as inpatient and appreciate them directing diuresis.  -SCr 3.33, no indication for dialysis at this point.  Would restart Lasix today    [facility] will sign off.   Medication Recommendations:  Lasix per IM and nephrology  Other recommendations (labs, testing, etc):   Follow up as an outpatient:  With Dr. Burt Knack   For questions or updates, please contact [place] Please consult [email or web address] for contact info under      Mertie Moores, MD  04/01/2018 1:49 PM    [facility] [address]  [place], [place]  [address] Pager [phone] Phone: [phone]; Fax: [phone]

## 2018-04-01 NOTE — Progress Notes (Signed)
[facility] Progress Note    Assessment/ Plan:   #Acute on CKD 5 with baseline creatinine around 2.4-2.8: -Patient lost around 7 kg since admission with good diuresis.  His lower extremity edema is better and overall feels better. He had sig orthostasis yesterday while walking. The serum creatinine level is stable today.  Will restart lasix 40 mg daily -No indication for dialysis, no sign of contrast nephropathy. AV fistula in left upper extremity is maturing. -Monitor BMP, strict ins and out. -sees dr Marval Regal as OP. Will arrange f/u  #Acute respiratory distress: Off O2 this AM with good sat.  Recommend ambulatory oxygen saturation assessment.  #Hypertension: Pressure acceptable.  Continue current medication.  #NSTEMI, h/o CABG, status post left heart cath on 1/30, on medical management.  Subjective:    Feeling well and wants to get out of bed   Objective:   BP 138/60   Pulse 82   Temp 98.5 F (36.9 C) (Oral)   Resp 20   Ht 5\' 9"  (1.753 m)   Wt 64.3 kg   SpO2 95%   BMI 20.93 kg/m   Intake/Output Summary (Last 24 hours) at 04/01/2018 1249 Last data filed at 04/01/2018 0800 Gross per 24 hour  Intake 1221.38 ml  Output 1125 ml  Net 96.38 ml   Weight change: -0.499 kg  Physical Exam: Gen: older gentleman, NAD CVS: RRR no m/r/g Resp: bibasilar crackles Abd: soft nontender NABS Ext: no LE edema  Imaging: Vas [place] Upper Extremity Venous Duplex  Result Date: 04/01/2018 UPPER VENOUS STUDY  Indications: Edema Other Indications: Left braciovephalic fistula placed 2/99/3716. Limitations: Edema. Comparison Study: Prior negative LUE venous study from 03/25/2018, is available                   for comparison. Performing Technologist: Sharion Dove RVS  Examination Guidelines: A complete evaluation includes B-mode imaging, spectral Doppler, color Doppler, and power Doppler as needed of all accessible portions of each vessel. Bilateral testing is considered an  integral part of a complete examination. Limited examinations for reoccurring indications may be performed as noted.  Right Findings: +----------+------------+----------+---------+-----------+-------+ RIGHT     CompressiblePropertiesPhasicitySpontaneousSummary +----------+------------+----------+---------+-----------+-------+ Subclavian                         Yes       Yes            +----------+------------+----------+---------+-----------+-------+  Left Findings: +----------+------------+----------+---------+-----------+-------+ LEFT      CompressiblePropertiesPhasicitySpontaneousSummary +----------+------------+----------+---------+-----------+-------+ IJV                                Yes       Yes            +----------+------------+----------+---------+-----------+-------+ Subclavian                         Yes       Yes            +----------+------------+----------+---------+-----------+-------+ Axillary                           Yes       Yes            +----------+------------+----------+---------+-----------+-------+ Brachial  Yes       Yes            +----------+------------+----------+---------+-----------+-------+ Radial        Full                                          +----------+------------+----------+---------+-----------+-------+ Ulnar         Full                                          +----------+------------+----------+---------+-----------+-------+ Cephalic      Full                                          +----------+------------+----------+---------+-----------+-------+ Basilic       Full                                          +----------+------------+----------+---------+-----------+-------+  Summary:  Right: No evidence of deep vein thrombosis in the upper extremity. No evidence of superficial vein thrombosis in the upper extremity.  Left: No evidence of thrombosis in the  subclavian.  *See table(s) above for measurements and observations.  Diagnosing physician: Ruta Hinds MD Electronically signed by Ruta Hinds MD on 04/01/2018 at 10:24:11 AM.    Final     Labs: BMET Recent Labs  Lab 03/26/18 1275 03/27/18 1700 03/28/18 1749 03/29/18 0448 03/30/18 0354 03/31/18 0316 04/01/18 0427  NA 138 137 136 138 139 137 138  K 3.4* 3.2* 3.6 3.5 3.8 3.8 3.8  CL 104 102 101 105 105 102 106  CO2 20* 23 25 22 22 22  21*  GLUCOSE 148* 118* 118* 107* 103* 135* 116*  BUN 64* 70* 65* 62* 64* 67* 71*  CREATININE 2.92* 3.31* 3.13* 3.18* 3.24* 3.28* 3.33*  CALCIUM 9.2 8.9 8.8* 9.0 9.1 9.1 9.0  PHOS  --   --  3.2  --  3.3  --   --    CBC Recent Labs  Lab 03/27/18 0247 03/28/18 0517 03/29/18 0448 03/31/18 0316  WBC 8.4 7.6 7.8 9.8  HGB 8.5* 8.2* 8.4* 8.6*  HCT 25.9* 23.9* 25.3* 25.7*  MCV 88.7 88.8 89.1 89.9  PLT 198 196 219 229    Medications:    . amLODipine  5 mg Oral QPM  . aspirin EC  81 mg Oral Daily  . atorvastatin  40 mg Oral QPM  . calcium acetate  667 mg Oral BID WC  . clopidogrel  75 mg Oral Daily  . ferrous gluconate  325 mg Oral Daily  . furosemide  40 mg Oral Daily  . hydrALAZINE  25 mg Oral BID  . levothyroxine  50 mcg Oral QAC breakfast      Madelon Lips, MD [place] pgr [phone] 04/01/2018, 12:49 PM

## 2018-04-01 NOTE — Care Management Important Message (Signed)
Important Message  Patient Details  Name: Henry [name] MRN: [id number] Date of Birth: [birth year]   Medicare Important Message Given:  Yes    [name] P [place] 04/01/2018, 5:04 PM

## 2018-04-01 NOTE — Progress Notes (Signed)
[place] TEAM 1 - Stepdown/ICU TEAM  ALF DOYLE  PPI:[id number] DOB: [birth year] DOA: 03/25/2018 PCP: [name], MD    Brief Narrative:  85 y.o.malew/ a hx of CKD stage V who has AV fistula in place in anticipation of dialysis initiation, CAD, DM2, HLD, HTN, and anemia of chronic disease who presented with 2 days of worsening shortness of breath and chest discomfort.  Chest x-ray showed pulmonary edema right greater than left.  Doppler of the lower extremities was negative.  He was given IV Lasix/IV Levaquin and admitted with cardiology/nephrology consultations.  Since admission, patient underwent VQ scan which was negative for pulmonary embolism.  Troponin peaked to 13 during the hospital course.    Subjective: Sitting up in a bedside chair. Reports that he still feels somewhat dizzy w/ postural changes, but that it is not as severe today. He feels more stable on his feet. Denies cp, n/v, sob, or abdom pain. Feels swelling in his L arm is stable or slightly improved.   Assessment & Plan:  NSTEMI Management per Cardiology w/ plan for medical tx only - Plavix added to ASA and statin - no BB due to AVB - cath 1/30 noted the following: severe native 3 vessel disease with total occlusion of the RCA and LAD, severe stenosis of the left circumflex, S/P remote CABG with continued patency of the LIMA-LAD and SVG-diagonal, total occlusion of the SVG-RCA and SVG-PDA  Acute respiratory failure - Diastolic CHF exacerbation - Pulmonary edema  Status post IV diuresis - net negative ~4L after diuretic held and small bolus given 2/3 - sats 90% or > w/ ambulation w/ PT - no significant edema on exam with exception to his left upper extremity - tolerated holding of diuretic and 250cc bolus well today, w/ improvement in his orthostasis - we must find a middle ground that prevents the patient from being dizzy but also allows [place] to avoid respiratory difficulty - diuretic being resumed today - follow  orthostatic sx clinically    Filed Weights   03/30/18 0614 03/31/18 0500 04/01/18 0435  Weight: 65.1 kg 64.8 kg 64.3 kg    L UE edema  Had AV fistula placed for HD 1/23 per Dr. Trula Slade - no evidence of impaired blood flow - ruled out DVT with venous duplex - discussed with Dr. Trula Slade who suggested elevation of the extremity - venous study may be required to r/o central stenosis, but can be arranged in outpt f/u if sx persist - pt to call [facility] office if swelling worsens, or if he develops erythema, pain, or parasthesias of the hand   Mod AoS Stable per TTE - f/u w/ Cards as outpt   Acute on CKD stage V patient nearing dialysis - care per Nephrology - crt relatively stable - he may be rapidly approaching the point where volume control will be exceedingly difficult in the setting of CKD, diastolic heart failure, and aortic stenosis - diuretic being resumed today - Nephrology following   Recent Labs  Lab 03/28/18 0517 03/29/18 0448 03/30/18 0354 03/31/18 0316 04/01/18 0427  CREATININE 3.13* 3.18* 3.24* 3.28* 3.33*     Hypertension BP stable presently - given significant symptomatic orthostasis we will attempt to allow his blood pressure to increase slightly - Cardiology agrees with this plan - goal systolic is [phone] as long as renal fxn and resp status can tolerate it   Diabetes mellitus CBG well controlled - A1c 6.1  Anemia of chronic disease Hgb holding steady -  transfuse for Hgb < 8.0  Hypothyroidism Cont Synthroid  DVT prophylaxis: SCDs Code Status: FULL CODE Family Communication: spoke w/ daughter at bedside  Disposition Plan: potential for d/c home 2/5 if orthostasis does not worsen w/ resumption of diuretic   Consultants:  Cardiology  Antimicrobials:  None    Objective: Blood pressure 138/60, pulse 82, temperature 98.5 F (36.9 C), temperature source Oral, resp. rate 20, height 5\' 9"  (1.753 m), weight 64.3 kg, SpO2 95 %.  Intake/Output Summary (Last 24  hours) at 04/01/2018 1508 Last data filed at 04/01/2018 0800 Gross per 24 hour  Intake 861.38 ml  Output 1125 ml  Net -263.62 ml   Filed Weights   03/30/18 0614 03/31/18 0500 04/01/18 0435  Weight: 65.1 kg 64.8 kg 64.3 kg    Examination: General: NAD Lungs: CTA th/o - no wheezing or crackles  Cardiovascular: RRR - stable systolic M  Abdomen: NT/ND, soft, bs+, no mass  Extremities: 1+ L UE edema w/o eythema stable to exam - no significant lower extremity edema bilaterally  CBC: Recent Labs  Lab 03/28/18 0517 03/29/18 0448 03/31/18 0316  WBC 7.6 7.8 9.8  HGB 8.2* 8.4* 8.6*  HCT 23.9* 25.3* 25.7*  MCV 88.8 89.1 89.9  PLT 196 219 740   Basic Metabolic Panel: Recent Labs  Lab 03/28/18 0517  03/30/18 0354 03/31/18 0316 04/01/18 0427  NA 136   < > 139 137 138  K 3.6   < > 3.8 3.8 3.8  CL 101   < > 105 102 106  CO2 25   < > 22 22 21*  GLUCOSE 118*   < > 103* 135* 116*  BUN 65*   < > 64* 67* 71*  CREATININE 3.13*   < > 3.24* 3.28* 3.33*  CALCIUM 8.8*   < > 9.1 9.1 9.0  MG 2.0  --   --  2.1  --   PHOS 3.2  --  3.3  --   --    < > = values in this interval not displayed.   GFR: Estimated Creatinine Clearance: 13.1 mL/min (A) (by C-G formula based on SCr of 3.33 mg/dL (H)).  Liver Function Tests: Recent Labs  Lab 03/30/18 0354  ALBUMIN 3.0*    Cardiac Enzymes: Recent Labs  Lab 03/25/18 2022 03/26/18 0227 03/26/18 1143  TROPONINI 7.20* 13.23* 11.79*    HbA1C: Hgb A1c MFr Bld  Date/Time Value Ref Range Status  03/28/2018 05:17 AM 6.1 (H) 4.8 - 5.6 % Final    Comment:    (NOTE) Pre diabetes:          5.7%-6.4% Diabetes:              >6.4% Glycemic control for   <7.0% adults with diabetes   01/01/2018 01:18 PM 6.2 4.6 - 6.5 % Final    Comment:    Glycemic Control Guidelines for People with Diabetes:Non Diabetic:  <6%Goal of Therapy: <7%Additional Action Suggested:  >8%     CBG: Recent Labs  Lab 03/31/18 0721 03/31/18 1208 03/31/18 2147  04/01/18 0804 04/01/18 1159  GLUCAP 112* 135* 119* 108* 139*    Recent Results (from the past 240 hour(s))  Blood culture (routine x 2)     Status: None   Collection Time: 03/25/18 10:24 AM  Result Value Ref Range Status   Specimen Description BLOOD RIGHT HAND  Final   Special Requests   Final    BOTTLES DRAWN AEROBIC AND ANAEROBIC Blood Culture adequate volume   Culture  NO GROWTH 5 DAYS  Final   Report Status 03/30/2018 FINAL  Final  Blood culture (routine x 2)     Status: None   Collection Time: 03/25/18 10:29 AM  Result Value Ref Range Status   Specimen Description BLOOD RIGHT ANTECUBITAL  Final   Special Requests   Final    BOTTLES DRAWN AEROBIC AND ANAEROBIC Blood Culture adequate volume   Culture NO GROWTH 5 DAYS  Final   Report Status 03/30/2018 FINAL  Final  MRSA PCR Screening     Status: None   Collection Time: 03/25/18  6:20 PM  Result Value Ref Range Status   MRSA by PCR NEGATIVE NEGATIVE Final    Comment:        The GeneXpert MRSA Assay (FDA approved for NASAL specimens only), is one component of a comprehensive MRSA colonization surveillance program. It is not intended to diagnose MRSA infection nor to guide or monitor treatment for MRSA infections. Performed at [facility], [place] [address] [place], [place] [address]      Scheduled Meds: . amLODipine  5 mg Oral QPM  . aspirin EC  81 mg Oral Daily  . atorvastatin  40 mg Oral QPM  . calcium acetate  667 mg Oral BID WC  . clopidogrel  75 mg Oral Daily  . ferrous gluconate  325 mg Oral Daily  . furosemide  40 mg Oral Daily  . hydrALAZINE  25 mg Oral BID  . levothyroxine  50 mcg Oral QAC breakfast     LOS: 7 days   Cherene Altes, MD [facility] Office  [phone] Pager - Text Page per Amion  If 7PM-7AM, please contact night-coverage per Amion 04/01/2018, 3:08 PM

## 2018-04-02 ENCOUNTER — Other Ambulatory Visit: Payer: Self-pay

## 2018-04-02 DIAGNOSIS — N184 Chronic kidney disease, stage 4 (severe): Secondary | ICD-10-CM

## 2018-04-02 LAB — BASIC METABOLIC PANEL
Anion gap: 13 (ref 5–15)
BUN: 62 mg/dL — AB (ref 8–23)
CO2: 21 mmol/L — ABNORMAL LOW (ref 22–32)
Calcium: 9.2 mg/dL (ref 8.9–10.3)
Chloride: 103 mmol/L (ref 98–111)
Creatinine, Ser: 3.08 mg/dL — ABNORMAL HIGH (ref 0.61–1.24)
GFR calc Af Amer: 19 mL/min — ABNORMAL LOW (ref >60)
GFR, EST NON AFRICAN AMERICAN: 17 mL/min — AB (ref >60)
GLUCOSE: 116 mg/dL — AB (ref 70–99)
Potassium: 4.1 mmol/L (ref 3.5–5.1)
Sodium: 137 mmol/L (ref 135–145)

## 2018-04-02 LAB — CBC
HCT: 26.8 % — ABNORMAL LOW (ref 39.0–52.0)
HEMOGLOBIN: 8.7 g/dL — AB (ref 13.0–17.0)
MCH: 29.4 pg (ref 26.0–34.0)
MCHC: 32.5 g/dL (ref 30.0–36.0)
MCV: 90.5 fL (ref 80.0–100.0)
Platelets: 241 10*3/uL (ref 150–400)
RBC: 2.96 MIL/uL — ABNORMAL LOW (ref 4.22–5.81)
RDW: 12.9 % (ref 11.5–15.5)
WBC: 8.1 10*3/uL (ref 4.0–10.5)
nRBC: 0 % (ref 0.0–0.2)

## 2018-04-02 LAB — GLUCOSE, CAPILLARY
Glucose-Capillary: 118 mg/dL — ABNORMAL HIGH (ref 70–99)
Glucose-Capillary: 105 mg/dL — ABNORMAL HIGH (ref 70–99)
Glucose-Capillary: 112 mg/dL — ABNORMAL HIGH (ref 70–99)

## 2018-04-02 MED ORDER — ATORVASTATIN CALCIUM 40 MG PO TABS: 40.0000 mg | ORAL_TABLET | Freq: Every evening | ORAL | 0 refills | Status: DC

## 2018-04-02 MED ORDER — AMLODIPINE BESYLATE 5 MG PO TABS: 5.0000 mg | ORAL_TABLET | Freq: Every evening | ORAL | 0 refills | Status: DC

## 2018-04-02 MED ORDER — CLOPIDOGREL BISULFATE 75 MG PO TABS: 75.0000 mg | ORAL_TABLET | Freq: Every day | ORAL | 0 refills | Status: DC

## 2018-04-02 MED ORDER — HYDRALAZINE HCL 25 MG PO TABS: 25.0000 mg | ORAL_TABLET | Freq: Two times a day (BID) | ORAL | 0 refills | Status: DC

## 2018-04-02 NOTE — Discharge Summary (Addendum)
Physician Discharge Summary  DEMONE LYLES WOE:[id number] DOB: 09-20-1924 DOA: 03/25/2018  PCP: [name], MD  Admit date: 03/25/2018 Discharge date: 04/02/2018  Admitted From: Home Disposition: Home  Recommendations for Outpatient Follow-up:  1. Follow up with PCP in 1 week 2. Follow up with cardiology and nephrology 3. Recheck BMP/CBC in one week 4. Please follow up on the following pending results: None  Home Health: PT, OT, RN, Aide, SW Equipment/Devices: Rollator  Discharge Condition: Stable CODE STATUS: Full code Diet recommendation: Heart healthy   Brief/Interim Summary:  Admission HPI written by Barb Merino, MD   Chief Complaint: Shortness of breath.  HPI: Henry [name] is a 85 y.o. male with medical history significant of chronic kidney disease stage IV, coronary artery disease, type 2 diabetes without complications currently not on treatment, hyperlipidemia, hypertension, anemia of chronic disease presents to the emergency room with 2 days of worsening shortness of breath and chest discomfort.  According to the patient, he underwent a fistula placement on his left arm for planning of hemodialysis in the near future.  He does not have any pulmonary problems.  He is not on oxygen at home.  He was feeling slightly short of breath 2 days ago, this is progressively worsened and since yesterday afternoon he has been feeling short of breath on minimal ambulation.  Denies any fever chills.  Denies any cough.  Denies any flulike symptoms.  No wheezing.  Along with shortness of breath, he had dull central chest pain, about 4 out of 10 and mostly associated heaviness.  This morning he did not feel good.  He felt weak, he feels dizzy so came to ER. ED Course: His blood pressures are stable in the ER.  Patient was found with increased respiratory rate and in respiratory distress, was a started on BiPAP and he feels somehow improved with BiPAP.  His hemoglobin is low but at  his baseline.  Bicarb is 19.  BUN is 64/3.09 which is mildly elevated than his baseline.  His baseline creatinine about 2.4-2.6.  A chest x-ray showed pulmonary edema, right more than left.  His point-of-care troponin and subsequent troponin were elevated.  He is currently chest pain-free.  Duplex of the extremities were negative for DVT.  VQ scan was ordered, he is on BiPAP so unable to do it.  Patient is stabilizing on BiPAP.  He was given 40 mg of IV Lasix.  He was also given 250 mg of IV Levaquin. Cardiology was notified from ER. I also discussed case with nephrology.   Hospital course:  NSTEMI Patient underwent cardiac cath on 1/30 which was significant for multivessel disease (results below) with no clear culprit and no targets for PCI. Cardiology recommending medical management. Discharged on aspirin and Plavix.  Acute respiratory failure with hypoxia Secondary to pulmonary edema. Weaned to room air. Resolved.  Acute pulmonary edema Secondary to acute heart failure. Improved with diuresis.  Acute on chronic diastolic heart failure Patient diuresed. Transthoracic Echocardiogram significant for an EF of 50-55%. Discharged on lasix. Discontinue Coreg secondary to heart block. Continue Lasix on discharge. Close cardiology follow-up.  Second degree heart block Mobitz type 1. No beta blockers per cardiology. Coreg discontinued.  Essential hypertension Stable. Goal SBP of 140-160. Patient currently on hydralazine 25 mg BID, amlodipine 5 mg daily and lasix.  L UE edema In setting of AV fistula. Venous duplex obtained and negative.  Moderate aortic stenosis Cardiology follow-up  Orthostatic hypotension In setting of aggressive  diuresis. Improved prior to discharge.  Acute kidney injury on CKD stage V Patient follows with [facility] as an outpatient. Creatinine stable.  Diabetes mellitus, type 2 Well controlled with a hemoglobin A1C of 6.1%. Diet control recommended.  Anemia of chronic  disease In setting of CKD. Stable.  Hypothyroidism Continued Synthroid   Discharge Diagnoses:  Principal Problem:   Respiratory failure with hypoxia (HCC) Active Problems:   HYPERCHOLESTEROLEMIA   Anemia of chronic disease   Diabetes mellitus type 2, uncomplicated (HCC)   Carotid artery disease (HCC)   Hypertension   Chronic kidney disease (CKD), stage IV (severe) (HCC)   Pulmonary edema   Respiratory failure, acute (HCC)   Elevated troponin   NSTEMI (non-ST elevated myocardial infarction) (HCC)   Acute on chronic combined systolic and diastolic CHF (congestive heart failure) [facility])    Discharge Instructions  Discharge Instructions    Call MD for:  persistant dizziness or light-headedness   Complete by:  As directed    Call MD for:  severe uncontrolled pain   Complete by:  As directed    Diet - low sodium heart healthy   Complete by:  As directed    Increase activity slowly   Complete by:  As directed      Allergies as of 04/02/2018   No Known Allergies     Medication List    STOP taking these medications   carvedilol 12.5 MG tablet Commonly known as:  COREG   cilostazol 100 MG tablet Commonly known as:  PLETAL     TAKE these medications   accu-chek multiclix lancets USE TWICE DAILY.   amLODipine 5 MG tablet Commonly known as:  NORVASC Take 1 tablet (5 mg total) by mouth every evening. Start taking on:  April 03, 2018 What changed:    medication strength  how much to take   aspirin EC 81 MG tablet Take 81 mg by mouth daily.   atorvastatin 40 MG tablet Commonly known as:  LIPITOR Take 1 tablet (40 mg total) by mouth every evening. What changed:    medication strength  how much to take   calcium acetate 667 MG capsule Commonly known as:  PHOSLO Take 667 mg by mouth 2 (two) times daily.   clopidogrel 75 MG tablet Commonly known as:  PLAVIX Take 1 tablet (75 mg total) by mouth daily. Start taking on:  April 03, 2018   ferrous  gluconate 325 MG tablet Commonly known as:  FERGON Take 325 mg by mouth daily.   furosemide 40 MG tablet Commonly known as:  LASIX Take 40 mg by mouth daily.   glucose blood test strip Commonly known as:  ACCU-CHEK AVIVA PLUS CHECK BLOOD SUGAR TWICE A DAY.   hydrALAZINE 25 MG tablet Commonly known as:  APRESOLINE Take 1 tablet (25 mg total) by mouth 2 (two) times daily.   levothyroxine 50 MCG tablet Commonly known as:  SYNTHROID, LEVOTHROID TAKE ONE TABLET BY MOUTH ONCE DAILY BEFORE BREAKFAST What changed:    how much to take  how to take this  when to take this  additional instructions   multivitamin with minerals Tabs tablet Take 1 tablet by mouth daily. Centrum Silver Vitamin C   PRESERVISION AREDS 2 PO Take 1 tablet by mouth daily.   traMADol 50 MG tablet Commonly known as:  ULTRAM Take 1 tablet (50 mg total) by mouth every 6 (six) hours as needed.   Vitamin D (Ergocalciferol) 1.25 MG (50000 UT) Caps capsule Commonly known as:  DRISDOL Take 50,000 Units by mouth 2 (two) times a week. Mondays & Thursdays            Durable Medical Equipment  (From admission, onward)         Start     Ordered   03/29/18 1247  For home use only DME 4 wheeled rolling walker with seat  Once    Question:  Patient needs a walker to treat with the following condition  Answer:  Weakness   03/29/18 1301         Follow-up Information    [facility].   Specialty:  Home Health Services Why:  Providers will contact you to arrange appointments.  Contact information: [place] [address] [place] Follow up.   Why:  Staff will contact you to help with community resources.  Contact information: [address]       [name], MD. Schedule an appointment as soon as possible for a visit in 1 week(s).   Specialties:  Internal Medicine,  Radiology Contact information: [address] [place] [address] [phone]        Sherren Mocha, MD. Schedule an appointment as soon as possible for a visit in 1 week(s).   Specialty:  Cardiology Why:  Heart failure Contact information: [address] [place] [address] [phone]        Donato Heinz, MD. Schedule an appointment as soon as possible for a visit in 1 week(s).   Specialty:  Nephrology Why:  Kidney disease Contact information: [place] [address] [phone]          No Known Allergies  Consultations:  Cardiology   Procedures/Studies: Dg Chest 2 View  Result Date: 03/28/2018 CLINICAL DATA:  Shortness of breath.  Pulmonary edema. EXAM: CHEST - 2 VIEW COMPARISON:  03/26/2018, 03/25/2018 and 01/06/2018 FINDINGS: There are bilateral pulmonary infiltrates which appear slightly worse than on the prior study. There are small bilateral pleural effusions, diminished slightly. Heart size is normal. CABG. Pulmonary vascularity is at the upper limits of normal. No acute bone abnormality. IMPRESSION: Overall slight progression pulmonary edema. However, the pleural effusions have diminished. Electronically Signed   By: Lorriane Shire M.D.   On: 03/28/2018 17:51   Dg Chest 2 View  Result Date: 03/26/2018 CLINICAL DATA:  Respiratory failure EXAM: CHEST - 2 VIEW COMPARISON:  03/25/18 FINDINGS: Cardiac shadow is stable. Postsurgical changes are again noted. Patchy infiltrative changes are again seen in the right lung stable from the prior exam. No new focal infiltrate is seen. Small bilateral pleural effusions are noted. IMPRESSION: No change from the previous day. Electronically Signed   By: Inez Catalina M.D.   On: 03/26/2018 15:23   US Renal  Result Date: 03/25/2018 CLINICAL DATA:  Acute kidney failure. EXAM: RENAL / URINARY TRACT ULTRASOUND COMPLETE COMPARISON:  CT, 05/10/2017 FINDINGS: Right Kidney: Renal  measurements: 8.4 x 3.9 x 4.6 cm = volume: 81 mL. Borderline increased parenchymal echogenicity. Mild diffuse renal cortical thinning. Mid to upper pole cyst measuring 1.4 x 1.2 x 1.3 cm. No other masses, no stones and no hydronephrosis. Left Kidney: Renal measurements: 10.7 x 5 x 5.5 cm = volume: 156 mL. Echogenicity within normal limits. No mass or hydronephrosis visualized. Bladder: Appears normal for degree of bladder distention. Incidental note of bilateral pleural  effusions. IMPRESSION: 1. No acute findings.  No hydronephrosis. 2. Borderline increased right renal parenchymal echogenicity with renal cortical thinning, and a relative decrease in the size of the right kidney compared to the left, consistent with medical renal disease. 3. 14 mm right renal cyst. 4. Bilateral pleural effusions. Electronically Signed   By: Lajean Manes M.D.   On: 03/25/2018 20:10   Nm Pulmonary Vent And Perf (v/q Scan)  Result Date: 03/26/2018 CLINICAL DATA:  Worsening short of breath. Chest discomfort. Pulmonary edema. EXAM: NUCLEAR MEDICINE VENTILATION - PERFUSION LUNG SCAN TECHNIQUE: Ventilation images were obtained in multiple projections using inhaled aerosol Tc-67m DTPA. Perfusion images were obtained in multiple projections after intravenous injection of Tc-39m MAA. RADIOPHARMACEUTICALS:  Thirty-one mCi of Tc-36m DTPA aerosol inhalation and 4.3 mCi Tc34m MAA IV COMPARISON:  None. FINDINGS: Ventilation: No focal ventilation defect. Perfusion: No wedge shaped peripheral perfusion defects to suggest acute pulmonary embolism. IMPRESSION: No evidence acute pulmonary embolism. Electronically Signed   By: Suzy Bouchard M.D.   On: 03/26/2018 15:12   Dg Chest Port 1 View  Result Date: 03/25/2018 CLINICAL DATA:  Shortness of breath and chest pain. EXAM: PORTABLE CHEST 1 VIEW COMPARISON:  01/06/2018 FINDINGS: Previous median sternotomy and CABG procedure. Mild cardiac enlargement. There is diffuse pulmonary edema which is  new from previous exam. Airspace consolidation within the right midlung and right base is also new. IMPRESSION: 1. Moderate pulmonary edema. 2. Right midlung and right base airspace opacities which may represent pneumonia and/or asymmetric edema. Electronically Signed   By: Kerby Moors M.D.   On: 03/25/2018 09:13   Vas [place] Lower Extremity Venous (dvt) (only [facility])  Result Date: 03/25/2018  Lower Venous Study Indications: Swelling.  Performing Technologist: Abram Sander RVS  Examination Guidelines: A complete evaluation includes B-mode imaging, spectral Doppler, color Doppler, and power Doppler as needed of all accessible portions of each vessel. Bilateral testing is considered an integral part of a complete examination. Limited examinations for reoccurring indications may be performed as noted.  Right Venous Findings: +---------+---------------+---------+-----------+----------+-------+          CompressibilityPhasicitySpontaneityPropertiesSummary +---------+---------------+---------+-----------+----------+-------+ CFV      Full           Yes      Yes                          +---------+---------------+---------+-----------+----------+-------+ SFJ      Full                                                 +---------+---------------+---------+-----------+----------+-------+ FV Prox  Full                                                 +---------+---------------+---------+-----------+----------+-------+ FV Mid   Full                                                 +---------+---------------+---------+-----------+----------+-------+ FV DistalFull                                                 +---------+---------------+---------+-----------+----------+-------+  PFV      Full                                                 +---------+---------------+---------+-----------+----------+-------+ POP      Full           Yes      Yes                           +---------+---------------+---------+-----------+----------+-------+ PTV      Full                                                 +---------+---------------+---------+-----------+----------+-------+ PERO     Full                                                 +---------+---------------+---------+-----------+----------+-------+  Left Venous Findings: +---------+---------------+---------+-----------+----------+-------+          CompressibilityPhasicitySpontaneityPropertiesSummary +---------+---------------+---------+-----------+----------+-------+ CFV      Full           Yes      Yes                          +---------+---------------+---------+-----------+----------+-------+ SFJ      Full                                                 +---------+---------------+---------+-----------+----------+-------+ FV Prox  Full                                                 +---------+---------------+---------+-----------+----------+-------+ FV Mid   Full                                                 +---------+---------------+---------+-----------+----------+-------+ FV DistalFull                                                 +---------+---------------+---------+-----------+----------+-------+ PFV      Full                                                 +---------+---------------+---------+-----------+----------+-------+ POP      Full           Yes      Yes                          +---------+---------------+---------+-----------+----------+-------+  PTV      Full                                                 +---------+---------------+---------+-----------+----------+-------+ PERO     Full                                                 +---------+---------------+---------+-----------+----------+-------+    Summary: Right: There is no evidence of deep vein thrombosis in the lower extremity. No cystic structure found in the popliteal  fossa. Left: There is no evidence of deep vein thrombosis in the lower extremity. No cystic structure found in the popliteal fossa.  *See table(s) above for measurements and observations. Electronically signed by Ruta Hinds MD on 03/25/2018 at 11:55:06 AM.    Final    Vas [place] Upper Extremity Venous Duplex  Result Date: 04/01/2018 UPPER VENOUS STUDY  Indications: Edema Other Indications: Left braciovephalic fistula placed 7/42/5956. Limitations: Edema. Comparison Study: Prior negative LUE venous study from 03/25/2018, is available                   for comparison. Performing Technologist: Sharion Dove RVS  Examination Guidelines: A complete evaluation includes B-mode imaging, spectral Doppler, color Doppler, and power Doppler as needed of all accessible portions of each vessel. Bilateral testing is considered an integral part of a complete examination. Limited examinations for reoccurring indications may be performed as noted.  Right Findings: +----------+------------+----------+---------+-----------+-------+ RIGHT     CompressiblePropertiesPhasicitySpontaneousSummary +----------+------------+----------+---------+-----------+-------+ Subclavian                         Yes       Yes            +----------+------------+----------+---------+-----------+-------+  Left Findings: +----------+------------+----------+---------+-----------+-------+ LEFT      CompressiblePropertiesPhasicitySpontaneousSummary +----------+------------+----------+---------+-----------+-------+ IJV                                Yes       Yes            +----------+------------+----------+---------+-----------+-------+ Subclavian                         Yes       Yes            +----------+------------+----------+---------+-----------+-------+ Axillary                           Yes       Yes            +----------+------------+----------+---------+-----------+-------+ Brachial                            Yes       Yes            +----------+------------+----------+---------+-----------+-------+ Radial        Full                                          +----------+------------+----------+---------+-----------+-------+  Ulnar         Full                                          +----------+------------+----------+---------+-----------+-------+ Cephalic      Full                                          +----------+------------+----------+---------+-----------+-------+ Basilic       Full                                          +----------+------------+----------+---------+-----------+-------+  Summary:  Right: No evidence of deep vein thrombosis in the upper extremity. No evidence of superficial vein thrombosis in the upper extremity.  Left: No evidence of thrombosis in the subclavian.  *See table(s) above for measurements and observations.  Diagnosing physician: Ruta Hinds MD Electronically signed by Ruta Hinds MD on 04/01/2018 at 10:24:11 AM.    Final    Ue Venous Duplex ([facility] Only)  Result Date: 03/25/2018 UPPER VENOUS STUDY  Indications: Swelling Performing Technologist: Abram Sander RVS  Examination Guidelines: A complete evaluation includes B-mode imaging, spectral Doppler, color Doppler, and power Doppler as needed of all accessible portions of each vessel. Bilateral testing is considered an integral part of a complete examination. Limited examinations for reoccurring indications may be performed as noted.  Left Findings: +----------+------------+----------+---------+-----------+-------+ LEFT      CompressiblePropertiesPhasicitySpontaneousSummary +----------+------------+----------+---------+-----------+-------+ IJV           Full                 Yes       Yes            +----------+------------+----------+---------+-----------+-------+ Subclavian    Full                 Yes       Yes             +----------+------------+----------+---------+-----------+-------+ Axillary      Full                 Yes       Yes            +----------+------------+----------+---------+-----------+-------+ Brachial      Full                 Yes       Yes            +----------+------------+----------+---------+-----------+-------+ Radial        Full                                          +----------+------------+----------+---------+-----------+-------+ Ulnar         Full                                          +----------+------------+----------+---------+-----------+-------+ Cephalic      Full                                          +----------+------------+----------+---------+-----------+-------+  Basilic       Full                                          +----------+------------+----------+---------+-----------+-------+  Summary:  Left: No evidence of deep vein thrombosis in the upper extremity. No evidence of superficial vein thrombosis in the upper extremity.  *See table(s) above for measurements and observations.  Diagnosing physician: Ruta Hinds MD Electronically signed by Ruta Hinds MD on 03/25/2018 at 11:55:14 AM.    Final      Transthoracic Echocardiogram (03/26/2018) IMPRESSIONS    1. The left ventricle appears to be normal in size, has normal wall thickness 50-55% ejection fraction Spectral Doppler shows pseudonormal pattern of diastolic filling.  2. Probable basal inferolateral and lateral hypokinesis.  3. Right ventricular systolic pressure is is normal.  4. The right ventricle has normal size and normal systolic function.  5. Normal left atrial size.  6. Normal right atrial size.  7. Mitral valve regurgitation is mild to moderate by color flow Doppler.  8. Normal tricuspid valve.  9. Aortic valve regurgitation is mild by color flow Doppler. 10. Moderate stenosis of the aortic valve. 11. There is little change in the aortic stenosis  severity, but there appears to be a new limited area of hypokinesis in the left circumflex artery distribution. 12. No atrial level shunt detected by color flow Doppler. 13. Aortic valve tricuspid. 14. The mitral valve is degenerative.  Cardiac catheterization (03/27/2018) Conclusion     Ost RCA to Prox RCA lesion is 100% stenosed.  Origin to Prox Graft lesion is 100% stenosed.  Origin lesion is 100% stenosed.  Prox LAD lesion is 100% stenosed.  Ost 1st Mrg lesion is 90% stenosed.   1. Severe native 3 vessel disease with total occlusion of the RCA and LAD, severe stenosis of the left circumflex 2. S/P remote CABG with continued patency of the LIMA-LAD and SVG-diagonal, total occlusion of the SVG-RCA and SVG-PDA  Recommend medical management of CAD. No clear culprit lesion. Both SVG occlusions appear chronic. No targets for PCI.      Subjective: No chest pain, palpitations  Discharge Exam: Vitals:   04/02/18 0454 04/02/18 0959  BP: (!) 145/60 (!) 141/52  Pulse: 81   Resp: 19   Temp: 98.1 F (36.7 C)   SpO2: 96%    Vitals:   04/01/18 2228 04/02/18 0454 04/02/18 0457 04/02/18 0959  BP:  (!) 145/60  (!) 141/52  Pulse: 64 81    Resp: (!) 21 19    Temp:  98.1 F (36.7 C)    TempSrc:  Oral    SpO2:  96%    Weight:   63.7 kg   Height:        General: Pt is alert, awake, not in acute distress Cardiovascular: RRR, S1/S2 +, no rubs, no gallops Respiratory: CTA bilaterally, no wheezing, no rhonchi Abdominal: Soft, NT, ND, bowel sounds + Extremities: no edema, no cyanosis    The results of significant diagnostics from this hospitalization (including imaging, microbiology, ancillary and laboratory) are listed below for reference.     Microbiology: Recent Results (from the past 240 hour(s))  Blood culture (routine x 2)     Status: None   Collection Time: 03/25/18 10:24 AM  Result Value Ref Range Status   Specimen Description BLOOD RIGHT HAND  Final   Special  Requests  Final    BOTTLES DRAWN AEROBIC AND ANAEROBIC Blood Culture adequate volume   Culture NO GROWTH 5 DAYS  Final   Report Status 03/30/2018 FINAL  Final  Blood culture (routine x 2)     Status: None   Collection Time: 03/25/18 10:29 AM  Result Value Ref Range Status   Specimen Description BLOOD RIGHT ANTECUBITAL  Final   Special Requests   Final    BOTTLES DRAWN AEROBIC AND ANAEROBIC Blood Culture adequate volume   Culture NO GROWTH 5 DAYS  Final   Report Status 03/30/2018 FINAL  Final  MRSA PCR Screening     Status: None   Collection Time: 03/25/18  6:20 PM  Result Value Ref Range Status   MRSA by PCR NEGATIVE NEGATIVE Final    Comment:        The GeneXpert MRSA Assay (FDA approved for NASAL specimens only), is one component of a comprehensive MRSA colonization surveillance program. It is not intended to diagnose MRSA infection nor to guide or monitor treatment for MRSA infections. Performed at [facility], [place] [address] [place], [place] [address]      Labs: BNP (last 3 results) Recent Labs    03/25/18 0808  BNP 858.8*   Basic Metabolic Panel: Recent Labs  Lab 03/28/18 0517 03/29/18 0448 03/30/18 0354 03/31/18 0316 04/01/18 0427 04/02/18 0451  NA 136 138 139 137 138 137  K 3.6 3.5 3.8 3.8 3.8 4.1  CL 101 105 105 102 106 103  CO2 25 22 22 22  21* 21*  GLUCOSE 118* 107* 103* 135* 116* 116*  BUN 65* 62* 64* 67* 71* 62*  CREATININE 3.13* 3.18* 3.24* 3.28* 3.33* 3.08*  CALCIUM 8.8* 9.0 9.1 9.1 9.0 9.2  MG 2.0  --   --  2.1  --   --   PHOS 3.2  --  3.3  --   --   --    Liver Function Tests: Recent Labs  Lab 03/30/18 0354  ALBUMIN 3.0*   No results for input(s): LIPASE, AMYLASE in the last 168 hours. No results for input(s): AMMONIA in the last 168 hours. CBC: Recent Labs  Lab 03/27/18 0247 03/28/18 0517 03/29/18 0448 03/31/18 0316 04/02/18 0451  WBC 8.4 7.6 7.8 9.8 8.1  HGB 8.5* 8.2* 8.4* 8.6* 8.7*  HCT 25.9* 23.9* 25.3* 25.7* 26.8*   MCV 88.7 88.8 89.1 89.9 90.5  PLT 198 196 219 229 241   Cardiac Enzymes: No results for input(s): CKTOTAL, CKMB, CKMBINDEX, TROPONINI in the last 168 hours. BNP: Invalid input(s): POCBNP CBG: Recent Labs  Lab 04/01/18 0804 04/01/18 1159 04/01/18 2201 04/02/18 0758 04/02/18 1147  GLUCAP 108* 139* 118* 105* 112*   D-Dimer No results for input(s): DDIMER in the last 72 hours. Hgb A1c No results for input(s): HGBA1C in the last 72 hours. Lipid Profile No results for input(s): CHOL, HDL, LDLCALC, TRIG, CHOLHDL, LDLDIRECT in the last 72 hours. Thyroid function studies No results for input(s): TSH, T4TOTAL, T3FREE, THYROIDAB in the last 72 hours.  Invalid input(s): FREET3 Anemia work up No results for input(s): VITAMINB12, FOLATE, FERRITIN, TIBC, IRON, RETICCTPCT in the last 72 hours. Urinalysis    Component Value Date/Time   COLORURINE YELLOW 04/10/2012 1929   APPEARANCEUR CLEAR 04/10/2012 1929   LABSPEC 1.016 04/10/2012 1929   PHURINE 7.0 04/10/2012 1929   GLUCOSEU 250 (A) 04/10/2012 [place] NEGATIVE 04/10/2012 [place] NEGATIVE 04/10/2012 [place] NEGATIVE 04/10/2012 [place]  100 (A) 04/10/2012 1929   UROBILINOGEN 0.2 04/10/2012 1929   NITRITE NEGATIVE 04/10/2012 1929   LEUKOCYTESUR NEGATIVE 04/10/2012 1929   Sepsis Labs Invalid input(s): PROCALCITONIN,  WBC,  LACTICIDVEN Microbiology Recent Results (from the past 240 hour(s))  Blood culture (routine x 2)     Status: None   Collection Time: 03/25/18 10:24 AM  Result Value Ref Range Status   Specimen Description BLOOD RIGHT HAND  Final   Special Requests   Final    BOTTLES DRAWN AEROBIC AND ANAEROBIC Blood Culture adequate volume   Culture NO GROWTH 5 DAYS  Final   Report Status 03/30/2018 FINAL  Final  Blood culture (routine x 2)     Status: None   Collection Time: 03/25/18 10:29 AM  Result Value Ref Range Status   Specimen Description BLOOD RIGHT ANTECUBITAL  Final   Special  Requests   Final    BOTTLES DRAWN AEROBIC AND ANAEROBIC Blood Culture adequate volume   Culture NO GROWTH 5 DAYS  Final   Report Status 03/30/2018 FINAL  Final  MRSA PCR Screening     Status: None   Collection Time: 03/25/18  6:20 PM  Result Value Ref Range Status   MRSA by PCR NEGATIVE NEGATIVE Final    Comment:        The GeneXpert MRSA Assay (FDA approved for NASAL specimens only), is one component of a comprehensive MRSA colonization surveillance program. It is not intended to diagnose MRSA infection nor to guide or monitor treatment for MRSA infections. Performed at [facility], Start [address] [place], [place] [address]      SIGNED:   Cordelia Poche, MD [facility] 04/02/2018, 12:55 PM

## 2018-04-02 NOTE — Progress Notes (Signed)
  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 Continues to feel better aside from orthostasis on 2/4. Appears to have better mobility on 2/5. Cr improved from 3.3 to 3.1. - lasix 40mg  daily - OP f/u w/ Dr. Marval Regal  #Acute respiratory distress: Off O2  - per primary  #Hypertension: mildly elevated last 24 hours. Per primary. On amlodipine, and la  #NSTEMI, h/o CABG, status post left heart cath on 1/30, on medical management.  Subjective:    Feeling well, feels ready for dc   Objective:   BP (!) 141/52   Pulse 81   Temp 98.1 F (36.7 C) (Oral)   Resp 19   Ht 5\' 9"  (1.753 m)   Wt 63.7 kg   SpO2 96%   BMI 20.75 kg/m   Intake/Output Summary (Last 24 hours) at 04/02/2018 1420 Last data filed at 04/02/2018 1300 Gross per 24 hour  Intake 910 ml  Output 1000 ml  Net -90 ml   Weight change: -0.544 kg  Physical Exam: Gen: 85 year old caucasian male, no acute distress CVS: RRR no m/r/g Resp: very mild bibasilar crackles Abd: s/nt/nd Ext: no LE edema  Imaging: No results found.  Labs: BMET Recent Labs  Lab 03/27/18 0247 03/28/18 0517 03/29/18 0448 03/30/18 0354 03/31/18 0316 04/01/18 0427 04/02/18 0451  NA 137 136 138 139 137 138 137  K 3.2* 3.6 3.5 3.8 3.8 3.8 4.1  CL 102 101 105 105 102 106 103  CO2 23 25 22 22 22  21* 21*  GLUCOSE 118* 118* 107* 103* 135* 116* 116*  BUN 70* 65* 62* 64* 67* 71* 62*  CREATININE 3.31* 3.13* 3.18* 3.24* 3.28* 3.33* 3.08*  CALCIUM 8.9 8.8* 9.0 9.1 9.1 9.0 9.2  PHOS  --  3.2  --  3.3  --   --   --    CBC Recent Labs  Lab 03/28/18 0517 03/29/18 0448 03/31/18 0316 04/02/18 0451  WBC 7.6 7.8 9.8 8.1  HGB 8.2* 8.4* 8.6* 8.7*  HCT 23.9* 25.3* 25.7* 26.8*  MCV 88.8 89.1 89.9 90.5  PLT 196 219 229 241    Medications:    . amLODipine  5 mg Oral QPM  . aspirin EC  81 mg Oral Daily  . atorvastatin   40 mg Oral QPM  . calcium acetate  667 mg Oral BID WC  . clopidogrel  75 mg Oral Daily  . ferrous gluconate  325 mg Oral Daily  . furosemide  40 mg Oral Daily  . hydrALAZINE  25 mg Oral BID  . levothyroxine  50 mcg Oral QAC breakfast    Guadalupe Dawn MD PGY-2 Family Medicine Resident 04/02/2018, 2:20 PM

## 2018-04-02 NOTE — Care Management Note (Signed)
Case Management Note  Patient Details  Name: ABASS MISENER MRN: [id number] Date of Birth: [birth year]  Subjective/Objective:     NSTEMI, acute resp failure, CHF, pulm edema               Action/Plan: Please see previous NCM notes. Spoke to MD about [facility] orders. Contacted [facility] to make aware of dc home today with HH and Rollator needed. Dtr was at bedside. Pt has Medicare.gov HH list/placed on chart.    Expected Discharge Date:                Expected Discharge Plan:  [place]  In-House Referral:  NA  Discharge planning Services  CM Consult  Post Acute Care Choice:  Home Health Choice offered to:  Patient, Adult Children  DME Arranged:  Walker rolling with seat DME Agency:  [place] Arranged:  RN, Disease Management, Nurse's Aide, PT, Social Work [employer] Agency:  [place]  Status of Service:  Completed, signed off  If discussed at [employer], dates discussed:    Additional Comments:  Erenest Rasher, RN 04/02/2018, 12:09 PM

## 2018-04-02 NOTE — Discharge Instructions (Addendum)
Henry [name],  You were in the hospital because of a heart attack and heart failure. You had a heart cath but no stent. Your medications have been adjusted; please take as newly prescribed. Please follow-up with your physicians (PCP, cardiologist, nephrologist). I have included information with regard to your heart failure. Please discuss the use of cilostazol with your heart doctor. Your Coreg has also been discontinued.

## 2018-04-02 NOTE — Progress Notes (Signed)
Physical Therapy Treatment Patient Details Name: Henry [name] MRN: [id number] DOB: Feb 10, 1925 Today's Date: 04/02/2018    History of Present Illness Pt is a 85 y.o. male admitted 03/25/18 with worsening SOB and chest discomfort. Worked up for NSTEMI and CHF exacerbation. PMH includes CKD V (recent LUE AV fistula placement for HD initiation), CAD, DM2, HTN.   PT Comments    Pt progressing well with mobility. Stability improved with ambulation using RW and stair training with rail support. Daughter present and supportive. Pt motivated to return home this afternoon; will have necessary support from family. Will need rollator delivered to room prior to d/c.    Follow Up Recommendations  Home health PT;Supervision - Intermittent     Equipment Recommendations  Other (comment)(rollator)    Recommendations for Other Services       Precautions / Restrictions Precautions Precautions: Fall Precaution Comments: LUE fistula placement w/ swelling Restrictions Weight Bearing Restrictions: No    Mobility  Bed Mobility Overal bed mobility: Independent                Transfers Overall transfer level: Independent Equipment used: None Transfers: Sit to/from Stand              Ambulation/Gait Ambulation/Gait assistance: Scientist, forensic (Feet): 400 Feet Assistive device: Rolling walker (2 wheeled) Gait Pattern/deviations: Step-through pattern;Decreased stride length Gait velocity: Decreased Gait velocity interpretation: 1.31 - 2.62 ft/sec, indicative of limited community ambulator General Gait Details: Slow, steady gait with RW; supervision for safety. Moving well   Stairs Stairs: Yes Stairs assistance: Supervision Stair Management: One rail Right;Alternating pattern;Step to pattern;Forwards Number of Stairs: 12 General stair comments: Alternating pattern to ascend; step-to pattern for descend. Good technique, able to recall "up with good" since h/o R knee pain.  Supervision for safety. Daughter present   Wheelchair Mobility    Modified Rankin (Stroke Patients Only)       Balance Overall balance assessment: Needs assistance Sitting-balance support: No upper extremity supported;Feet supported Sitting balance-Leahy Scale: Good     Standing balance support: During functional activity Standing balance-Leahy Scale: Fair                              Cognition Arousal/Alertness: Awake/alert Behavior During Therapy: WFL for tasks assessed/performed Overall Cognitive Status: Within Functional Limits for tasks assessed                                        Exercises      General Comments General comments (skin integrity, edema, etc.): Daughter present      Pertinent Vitals/Pain Pain Assessment: No/denies pain    Home Living                      Prior Function            PT Goals (current goals can now be found in the care plan section) Acute Rehab PT Goals Patient Stated Goal: to go home PT Goal Formulation: With patient Time For Goal Achievement: 04/12/18 Potential to Achieve Goals: Good Progress towards PT goals: Progressing toward goals    Frequency    Min 3X/week      PT Plan Current plan remains appropriate    Co-evaluation              AM-PAC PT "6 Clicks" Mobility  Outcome Measure  Help needed turning from your back to your side while in a flat bed without using bedrails?: None Help needed moving from lying on your back to sitting on the side of a flat bed without using bedrails?: None Help needed moving to and from a bed to a chair (including a wheelchair)?: None Help needed standing up from a chair using your arms (e.g., wheelchair or bedside chair)?: A Little Help needed to walk in hospital room?: A Little Help needed climbing 3-5 steps with a railing? : A Little 6 Click Score: 21    End of Session Equipment Utilized During Treatment: Gait belt Activity  Tolerance: Patient tolerated treatment well Patient left: in bed;with call bell/phone within reach;with family/visitor present Nurse Communication: Mobility status PT Visit Diagnosis: Muscle weakness (generalized) (M62.81)     Time: 1779-3903 PT Time Calculation (min) (ACUTE ONLY): 14 min  Charges:  $Gait Training: 8-22 mins                    Henry [name], PT, DPT Acute Rehabilitation Services  Pager [phone] Office [phone]  Henry [name] 04/02/2018, 11:44 AM

## 2018-04-03 ENCOUNTER — Telehealth: Payer: Self-pay | Admitting: *Deleted

## 2018-04-03 NOTE — Telephone Encounter (Signed)
Transition Care Management Follow-up Telephone Call   Date discharged? 04/02/18   How have you been since you were released from the hospital? Pt states he is doing much better   Do you understand why you were in the hospital? YES   Do you understand the discharge instructions? YES   Where were you discharged to? Home   Items Reviewed:  Medications reviewed: YES  Allergies reviewed: YES  Dietary changes reviewed: YES, heart healthy  Referrals reviewed: YES   Functional Questionnaire:   Activities of Daily Living (ADLs):   He states he are independent in the following: bathing and hygiene, feeding, continence, grooming, toileting and dressing States they require assistance with the following: ambulation He states he uses a walker to keep him balance and from falling   Any transportation issues/concerns?: NO   Any patient concerns? NO   Confirmed importance and date/time of follow-up visits scheduled YES, appt 04/08/18  Provider Appointment booked with Dr. Jenny Reichmann   Confirmed with patient if condition begins to worsen call PCP or go to the ER.  Patient was given the office number and encouraged to call back with question or concerns.  : YES

## 2018-04-04 ENCOUNTER — Encounter: Payer: Self-pay | Admitting: *Deleted

## 2018-04-04 ENCOUNTER — Other Ambulatory Visit: Payer: Self-pay | Admitting: *Deleted

## 2018-04-04 DIAGNOSIS — I5033 Acute on chronic diastolic (congestive) heart failure: Secondary | ICD-10-CM | POA: Diagnosis not present

## 2018-04-04 DIAGNOSIS — I214 Non-ST elevation (NSTEMI) myocardial infarction: Secondary | ICD-10-CM | POA: Diagnosis not present

## 2018-04-04 DIAGNOSIS — I132 Hypertensive heart and chronic kidney disease with heart failure and with stage 5 chronic kidney disease, or end stage renal disease: Secondary | ICD-10-CM | POA: Diagnosis not present

## 2018-04-04 DIAGNOSIS — N185 Chronic kidney disease, stage 5: Secondary | ICD-10-CM | POA: Diagnosis not present

## 2018-04-04 DIAGNOSIS — I350 Nonrheumatic aortic (valve) stenosis: Secondary | ICD-10-CM | POA: Diagnosis not present

## 2018-04-04 DIAGNOSIS — E1122 Type 2 diabetes mellitus with diabetic chronic kidney disease: Secondary | ICD-10-CM | POA: Diagnosis not present

## 2018-04-04 DIAGNOSIS — Z7982 Long term (current) use of aspirin: Secondary | ICD-10-CM | POA: Diagnosis not present

## 2018-04-04 DIAGNOSIS — I2510 Atherosclerotic heart disease of native coronary artery without angina pectoris: Secondary | ICD-10-CM | POA: Diagnosis not present

## 2018-04-04 NOTE — Patient Outreach (Signed)
[facility]) Care Management  04/04/2018  HARISH BRAM 27-Dec-1924 [id number]  CSW was able to make initial contact with patient today to perform phone assessment, as well as assess and assist with social work needs and services.  CSW introduced self, explained role and types of services provided through [facility] ([facility]).  CSW further explained to patient that CSW works with Natividad Brood, [facility], also with [facility]. CSW then explained the reason for the call, indicating that Mrs. Brewer thought that patient and his wife, Abed Schar would benefit from social work services and resources to assist with arranging in-home care services.  CSW obtained two HIPAA compliant identifiers from patient, which included patient's name and date of birth.  Patient had a difficult time trying to understand the purpose of CSW's call, indicating that he and Mrs. Prichett already receive home health services through [facility], as well as in-home aide services, through [facility].  CSW inquired as to whether or not patient felt that he and Mrs. Capote were receiving enough home care service hours per day, agreeing to assist with arranging for additional hours, as well as determine whether or not they qualify for Aid and Attendance benefits through [employer].  Patient encouraged CSW to contact his daughter, [name] [phone]), who currently resides in  [place], [place], but is a Marine scientist and handles all of patient and Mrs. Jennette's affairs.  CSW made an attempt to try and contact patient's daughter, [name] today to discuss in-home care services for patient and his wife; however, [name] was not available at the time of CSW's call.  A HIPAA compliant message was left for [name] on voicemail and CSW is currently awaiting a return call.  CSW will make another outreach  attempt to [name] within the next 3-4 business days, if a return call is not received in the meantime.  Nat Christen, BSW, MSW, LCSW  Licensed Education officer, environmental Health System  [place] [address] [place], [place] [address] Physical Address-[address], [place], [place] [address] Toll Free Main # [phone] Fax # [phone] Cell # [phone]  Office # [phone] [email or web address]

## 2018-04-07 DIAGNOSIS — E1122 Type 2 diabetes mellitus with diabetic chronic kidney disease: Secondary | ICD-10-CM | POA: Diagnosis not present

## 2018-04-07 DIAGNOSIS — I214 Non-ST elevation (NSTEMI) myocardial infarction: Secondary | ICD-10-CM | POA: Diagnosis not present

## 2018-04-07 DIAGNOSIS — I2510 Atherosclerotic heart disease of native coronary artery without angina pectoris: Secondary | ICD-10-CM | POA: Diagnosis not present

## 2018-04-07 DIAGNOSIS — N185 Chronic kidney disease, stage 5: Secondary | ICD-10-CM | POA: Diagnosis not present

## 2018-04-07 DIAGNOSIS — I132 Hypertensive heart and chronic kidney disease with heart failure and with stage 5 chronic kidney disease, or end stage renal disease: Secondary | ICD-10-CM | POA: Diagnosis not present

## 2018-04-07 DIAGNOSIS — I5033 Acute on chronic diastolic (congestive) heart failure: Secondary | ICD-10-CM | POA: Diagnosis not present

## 2018-04-08 ENCOUNTER — Telehealth: Payer: Self-pay | Admitting: Internal Medicine

## 2018-04-08 ENCOUNTER — Ambulatory Visit (INDEPENDENT_AMBULATORY_CARE_PROVIDER_SITE_OTHER): Payer: Medicare Other | Admitting: Internal Medicine

## 2018-04-08 ENCOUNTER — Other Ambulatory Visit (INDEPENDENT_AMBULATORY_CARE_PROVIDER_SITE_OTHER): Payer: Medicare Other

## 2018-04-08 ENCOUNTER — Other Ambulatory Visit: Payer: Self-pay | Admitting: *Deleted

## 2018-04-08 ENCOUNTER — Encounter: Payer: Self-pay | Admitting: Internal Medicine

## 2018-04-08 ENCOUNTER — Encounter: Payer: Self-pay | Admitting: *Deleted

## 2018-04-08 VITALS — BP 126/62 | HR 82 | Temp 97.6°F | Ht 69.0 in | Wt 144.0 lb

## 2018-04-08 DIAGNOSIS — N184 Chronic kidney disease, stage 4 (severe): Secondary | ICD-10-CM

## 2018-04-08 DIAGNOSIS — I214 Non-ST elevation (NSTEMI) myocardial infarction: Secondary | ICD-10-CM

## 2018-04-08 DIAGNOSIS — E119 Type 2 diabetes mellitus without complications: Secondary | ICD-10-CM

## 2018-04-08 DIAGNOSIS — N185 Chronic kidney disease, stage 5: Secondary | ICD-10-CM | POA: Diagnosis not present

## 2018-04-08 DIAGNOSIS — I2510 Atherosclerotic heart disease of native coronary artery without angina pectoris: Secondary | ICD-10-CM | POA: Diagnosis not present

## 2018-04-08 DIAGNOSIS — I10 Essential (primary) hypertension: Secondary | ICD-10-CM | POA: Diagnosis not present

## 2018-04-08 DIAGNOSIS — I5043 Acute on chronic combined systolic (congestive) and diastolic (congestive) heart failure: Secondary | ICD-10-CM

## 2018-04-08 DIAGNOSIS — E1122 Type 2 diabetes mellitus with diabetic chronic kidney disease: Secondary | ICD-10-CM | POA: Diagnosis not present

## 2018-04-08 DIAGNOSIS — I5033 Acute on chronic diastolic (congestive) heart failure: Secondary | ICD-10-CM | POA: Diagnosis not present

## 2018-04-08 DIAGNOSIS — I132 Hypertensive heart and chronic kidney disease with heart failure and with stage 5 chronic kidney disease, or end stage renal disease: Secondary | ICD-10-CM | POA: Diagnosis not present

## 2018-04-08 LAB — BASIC METABOLIC PANEL
BUN: 56 mg/dL — ABNORMAL HIGH (ref 6–23)
CO2: 26 mEq/L (ref 19–32)
Calcium: 10.2 mg/dL (ref 8.4–10.5)
Chloride: 99 mEq/L (ref 96–112)
Creatinine, Ser: 2.74 mg/dL — ABNORMAL HIGH (ref 0.40–1.50)
GFR: 21.84 mL/min — ABNORMAL LOW (ref >60.00)
GLUCOSE: 119 mg/dL — AB (ref 70–99)
Potassium: 4.8 mEq/L (ref 3.5–5.1)
Sodium: 135 mEq/L (ref 135–145)

## 2018-04-08 LAB — CBC WITH DIFFERENTIAL/PLATELET
Basophils Absolute: 0.1 10*3/uL (ref 0.0–0.1)
Basophils Relative: 1.3 % (ref 0.0–3.0)
Eosinophils Absolute: 0.2 10*3/uL (ref 0.0–0.7)
Eosinophils Relative: 2.8 % (ref 0.0–5.0)
HCT: 31.2 % — ABNORMAL LOW (ref 39.0–52.0)
HEMOGLOBIN: 10.4 g/dL — AB (ref 13.0–17.0)
Lymphocytes Relative: 18.2 % (ref 12.0–46.0)
Lymphs Abs: 1.4 10*3/uL (ref 0.7–4.0)
MCHC: 33.4 g/dL (ref 30.0–36.0)
MCV: 90.2 fl (ref 78.0–100.0)
Monocytes Absolute: 1.5 10*3/uL — ABNORMAL HIGH (ref 0.1–1.0)
Monocytes Relative: 20 % — ABNORMAL HIGH (ref 3.0–12.0)
Neutro Abs: 4.3 10*3/uL (ref 1.4–7.7)
Neutrophils Relative %: 57.7 % (ref 43.0–77.0)
Platelets: 437 10*3/uL — ABNORMAL HIGH (ref 150.0–400.0)
RBC: 3.46 Mil/uL — AB (ref 4.22–5.81)
RDW: 14 % (ref 11.5–15.5)
WBC: 7.5 10*3/uL (ref 4.0–10.5)

## 2018-04-08 NOTE — Telephone Encounter (Signed)
Copied from [place] [phone]. Topic: Quick Communication - Home Health Verbal Orders >> Apr 08, 2018 12:44 PM Scherrie Gerlach wrote: Caller/Agency: [facility] Callback Number: [phone] Requesting  OT, PT ,Skilled Nursing   Plan of care start verbal for all services.  Since pt is going to be seen at 2 pm today, they would like verbals asap Sharyn Lull can take the verbal ok for all services and let the others know Nursing 1 wk 1               2 wk 2              1 wk 6 OT and PT 2 wk 2  1 wk 2

## 2018-04-08 NOTE — Patient Instructions (Addendum)

## 2018-04-08 NOTE — Patient Outreach (Signed)
[facility]) Care Management  04/08/2018  Henry [name] 07-24-1924 [id number]   CSW received a return call from patient's daughter, [name] today, in regards to the HIPAA compliant message left on voicemail for [name] by CSW on Friday, April 04, 2018.  [name] requested a complete listing of all services provided by CSW, which CSW was able to provide.  [name] reported that patient is still alert and oriented, and able to perform activities of daily living independently.  [name] went on to say that patient currently provides 24 hour care and supervision to his wife, diagnosed with Alzheimers, roughly 10 years ago.  Patient arranged for his wife to receive in-home care services through [facility], to provide him with daily respite care.  [name] reported that patient is able to administer his own prescription medications, as well as that of his wife.  Patient is able to arrange transportation to and from all his physician appointments through his daughter, Casilda Carls, as well as several family friends.  [name] indicated that patient is not interested in pursuing assisted living placement for he and his wife, at the present time, wanting to try and remain in the home for as long as possible.  [name] believes that patient and his wife receive adequate care and supervision in the home and that no additional durable medical equipment needs to be ordered for them.  CSW will perform a case closure on patient, as all goals of treatment have been met from social work standpoint and no additional social work needs have been identified at this time.  CSW was able to make contact with patient today to follow-up regarding phone conversation with [name].  Patient denied being able to identify any social work specific needs at present, but indicated that he may require transportation in the future.  CSW has encouraged patient and [name] to contact CSW directly, if additional social work needs arise in the near future.  Both voiced understanding and were agreeable to this plan.  CSW will fax an update to patient's Primary Care Physician, Dr. Cathlean Cower to ensure that they are aware of CSW's involvement with patient's plan of care.   Nat Christen, BSW, MSW, LCSW  Licensed Education officer, environmental Health System  [place] [address], [place], [place] [address] Physical Address-[address], [place], [place] [address] Toll Free Main # [phone] Fax # [phone] Cell # [phone]  Office # [phone] [email or web address]

## 2018-04-08 NOTE — Progress Notes (Signed)
Subjective:   HPI  85 y.o.malewith medical history significant ofchronic kidney disease stage IV, coronary artery disease, type 2 diabetes without complications currently not on treatment, hyperlipidemia, hypertension, anemia of chronic disease presented to the emergency room 1/25 with 2 days of worsening shortness of breath and chest discomfort  x 2 days and this is progressively worsened culminating in he has been feeling short of breath on minimal ambulation.  Patient was found with increased respiratory rate and in respiratory distress, was a started on BiPAP and he felt improved with BiPAP. His hemoglobin is low but athis baseline. Bicarb is 19. BUN is 64/3.09 which is mildly elevated than his baseline. His baseline creatinine about 2.4-2.6. A chest x-ray showed pulmonary edema, right more than left. His point-of-care troponin and subsequent troponin were elevated. He is currently chest pain-free. Duplex of the extremities were negative for DVT. Seen per cardiology with dx:  NSTEMI;  Patient underwent cardiac cath on 1/30 which was significant for multivessel disease (results below) with no clear culprit and no targets for PCI. Cardiology recommending medical management. Discharged on aspirin and Plavix. Pt diuresed well and pulm edema resolved. Echo with EF 50-55%.  Coreg stopped due to heart block Mobitz 1. D/c on lasix for close cardiology f/u.  Today has been some light headed and a bit unstable on walking, but may be improving.   Wt Readings from Last 3 Encounters:  04/08/18 144 lb (65.3 kg)  04/02/18 140 lb 8 oz (63.7 kg)  03/20/18 150 lb (68 kg)  Wt has been down at home usually 137 but used to be 145-150 before all of this.  Has appt with Dr Juliann Pulse PA.  Getting HH with PT to start soon.  No new complaints Past Medical History:  Diagnosis Date  . Anemia   . CAD of autologous bypass graft   . Carotid artery disease ([place])   . Diabetes mellitus without complication  ([place])   . DJD (degenerative joint disease)   . History of anemia of chronic disease   . Hypercholesteremia   . Hypertension   . PVD (peripheral vascular disease) with claudication ([place])   . Renal insufficiency   . Wears dentures   . Wears glasses    Past Surgical History:  Procedure Laterality Date  . AV FISTULA PLACEMENT Left 03/20/2018   Procedure: ARTERIOVENOUS (AV) FISTULA CREATION LEFT ARM;  Surgeon: Serafina Mitchell, MD;  Location: [facility] OR;  Service: Vascular;  Laterality: Left;  . cataract surgery    . COLONOSCOPY     Hx: of  . CORONARY ARTERY BYPASS GRAFT  2000   by Dr. Cyndia Bent  . LEFT HEART CATH AND CORS/GRAFTS ANGIOGRAPHY N/A 03/27/2018   Procedure: LEFT HEART CATH AND CORS/GRAFTS ANGIOGRAPHY;  Surgeon: Sherren Mocha, MD;  Location: [facility];  Service: Cardiovascular;  Laterality: N/A;  . LUMBAR LAMINECTOMY/DECOMPRESSION MICRODISCECTOMY N/A 09/10/2012   Procedure: LUMBAR DECOMPRESSION,  IN SITU FUSION LUMBAR 4-5;  Surgeon: Melina Schools, MD;  Location: [facility];  Service: Orthopedics;  Laterality: N/A;  . MULTIPLE TOOTH EXTRACTIONS    . TONSILLECTOMY      reports that he quit smoking about 65 years ago. His smoking use included cigarettes. He has a 10.00 pack-year smoking history. He has never used smokeless tobacco. He reports previous alcohol use. He reports that he does not use drugs. family history includes Heart disease in his father and mother. No Known Allergies Current Outpatient Medications on File Prior to Visit  Medication  Sig Dispense Refill  . amLODipine (NORVASC) 5 MG tablet Take 1 tablet (5 mg total) by mouth every evening. 30 tablet 0  . aspirin EC 81 MG tablet Take 81 mg by mouth daily.     Marland Kitchen atorvastatin (LIPITOR) 40 MG tablet Take 1 tablet (40 mg total) by mouth every evening. 30 tablet 0  . calcium acetate (PHOSLO) 667 MG capsule Take 667 mg by mouth 2 (two) times daily.    . clopidogrel (PLAVIX) 75 MG tablet Take 1 tablet (75 mg total) by mouth  daily. 30 tablet 0  . ferrous gluconate (FERGON) 325 MG tablet Take 325 mg by mouth daily.     . furosemide (LASIX) 40 MG tablet Take 40 mg by mouth daily.    Marland Kitchen glucose blood (ACCU-CHEK AVIVA PLUS) test strip CHECK BLOOD SUGAR TWICE A DAY. 50 each 5  . hydrALAZINE (APRESOLINE) 25 MG tablet Take 1 tablet (25 mg total) by mouth 2 (two) times daily. 60 tablet 0  . Lancets (ACCU-CHEK MULTICLIX) lancets USE TWICE DAILY. 102 each 5  . levothyroxine (SYNTHROID, LEVOTHROID) 50 MCG tablet TAKE ONE TABLET BY MOUTH ONCE DAILY BEFORE BREAKFAST (Patient taking differently: Take 50 mcg by mouth daily before breakfast. ) 90 tablet 3  . Multiple Vitamin (MULTIVITAMIN WITH MINERALS) TABS tablet Take 1 tablet by mouth daily. Centrum Silver Vitamin C    . Multiple Vitamins-Minerals (PRESERVISION AREDS 2 PO) Take 1 tablet by mouth daily.    . traMADol (ULTRAM) 50 MG tablet Take 1 tablet (50 mg total) by mouth every 6 (six) hours as needed. 20 tablet 0  . Vitamin D, Ergocalciferol, (DRISDOL) 1.25 MG (50000 UT) CAPS capsule Take 50,000 Units by mouth 2 (two) times a week. Mondays & Thursdays     No current facility-administered medications on file prior to visit.    Review of Systems  Constitutional: Negative for other unusual diaphoresis or sweats HENT: Negative for ear discharge or swelling Eyes: Negative for other worsening visual disturbances Respiratory: Negative for stridor or other swelling  Gastrointestinal: Negative for worsening distension or other blood Genitourinary: Negative for retention or other urinary change Musculoskeletal: Negative for other MSK pain or swelling Skin: Negative for color change or other new lesions Neurological: Negative for worsening tremors and other numbness  Psychiatric/Behavioral: Negative for worsening agitation or other fatigue All other system neg per pt    Objective:   Physical Exam BP 126/62   Pulse 82   Temp 97.6 F (36.4 C) (Oral)   Ht 5\' 9"  (1.753 m)   Wt  144 lb (65.3 kg)   SpO2 97%   BMI 21.27 kg/m  VS noted,  Constitutional: Pt appears in NAD HENT: Head: NCAT.  Right Ear: External ear normal.  Left Ear: External ear normal.  Eyes: . Pupils are equal, round, and reactive to light. Conjunctivae and EOM are normal Nose: without d/c or deformity Neck: Neck supple. Gross normal ROM Cardiovascular: Normal rate and regular rhythm.   Pulmonary/Chest: Effort normal and breath sounds without rales or wheezing.  Abd:  Soft, NT, ND, + BS, no organomegaly Neurological: Pt is alert. At baseline orientation, motor grossly intact Skin: Skin is warm. No rashes, other new lesions, no LE edema Psychiatric: Pt behavior is normal without agitation  No other exam findings Lab Results  Component Value Date   WBC 7.5 04/08/2018   HGB 10.4 (L) 04/08/2018   HCT 31.2 (L) 04/08/2018   PLT 437.0 (H) 04/08/2018   GLUCOSE 119 (H) 04/08/2018  CHOL 164 01/22/2017   TRIG 110.0 01/22/2017   HDL 45.20 01/22/2017   LDLDIRECT 96.2 04/04/2007   LDLCALC 97 01/22/2017   ALT 15 03/25/2018   AST 26 03/25/2018   NA 135 04/08/2018   K 4.8 04/08/2018   CL 99 04/08/2018   CREATININE 2.74 (H) 04/08/2018   BUN 56 (H) 04/08/2018   CO2 26 04/08/2018   TSH 4.34 01/01/2018   PSA 1.47 01/03/2010   INR 1.13 03/25/2018   HGBA1C 6.1 (H) 03/28/2018   MICROALBUR 7.8 (H) 06/08/2008       Assessment & Plan:

## 2018-04-08 NOTE — Telephone Encounter (Signed)
vebal orders given

## 2018-04-09 DIAGNOSIS — I132 Hypertensive heart and chronic kidney disease with heart failure and with stage 5 chronic kidney disease, or end stage renal disease: Secondary | ICD-10-CM | POA: Diagnosis not present

## 2018-04-09 DIAGNOSIS — N185 Chronic kidney disease, stage 5: Secondary | ICD-10-CM | POA: Diagnosis not present

## 2018-04-09 DIAGNOSIS — I214 Non-ST elevation (NSTEMI) myocardial infarction: Secondary | ICD-10-CM | POA: Diagnosis not present

## 2018-04-09 DIAGNOSIS — I5033 Acute on chronic diastolic (congestive) heart failure: Secondary | ICD-10-CM | POA: Diagnosis not present

## 2018-04-09 DIAGNOSIS — E1122 Type 2 diabetes mellitus with diabetic chronic kidney disease: Secondary | ICD-10-CM | POA: Diagnosis not present

## 2018-04-09 DIAGNOSIS — I2510 Atherosclerotic heart disease of native coronary artery without angina pectoris: Secondary | ICD-10-CM | POA: Diagnosis not present

## 2018-04-10 ENCOUNTER — Telehealth: Payer: Self-pay | Admitting: Physician Assistant

## 2018-04-10 ENCOUNTER — Ambulatory Visit: Payer: Medicare Other | Admitting: *Deleted

## 2018-04-10 DIAGNOSIS — E1122 Type 2 diabetes mellitus with diabetic chronic kidney disease: Secondary | ICD-10-CM | POA: Diagnosis not present

## 2018-04-10 DIAGNOSIS — I214 Non-ST elevation (NSTEMI) myocardial infarction: Secondary | ICD-10-CM | POA: Diagnosis not present

## 2018-04-10 DIAGNOSIS — I132 Hypertensive heart and chronic kidney disease with heart failure and with stage 5 chronic kidney disease, or end stage renal disease: Secondary | ICD-10-CM | POA: Diagnosis not present

## 2018-04-10 DIAGNOSIS — I5033 Acute on chronic diastolic (congestive) heart failure: Secondary | ICD-10-CM | POA: Diagnosis not present

## 2018-04-10 DIAGNOSIS — N185 Chronic kidney disease, stage 5: Secondary | ICD-10-CM | POA: Diagnosis not present

## 2018-04-10 DIAGNOSIS — I2510 Atherosclerotic heart disease of native coronary artery without angina pectoris: Secondary | ICD-10-CM | POA: Diagnosis not present

## 2018-04-10 NOTE — Assessment & Plan Note (Signed)
stable overall by history and exam, recent data reviewed with pt, and pt to continue medical treatment as before,  to f/u any worsening symptoms or concerns  

## 2018-04-10 NOTE — Telephone Encounter (Signed)
Spoke to patient's daughter [name]) who is concerned about the patient's dizziness and elevated BP 147/60.  She said that the patient's Carvedilol was stopped in the hospital, because it put him in a heart block on 1/27, but she's wondering if it could be restarted low dose.    He saw his PCP on 2/11 who did note the patient's low hemoglobin and dizziness, but mentioned that with time it should get better.  He had a hospital f/u appt with Nicki Reaper later this month, but I moved it up to 2/14 @ 8:45am.  She verbalized understanding and was thankful for the call.

## 2018-04-10 NOTE — Assessment & Plan Note (Signed)
Resolved, stable overall by history and exam, recent data reviewed with pt, and pt to continue medical treatment as before,  to f/u any worsening symptoms or concerns

## 2018-04-10 NOTE — Telephone Encounter (Signed)
New Message:   Patient daughter calling concerning her father. Patient came home from there hospital and is very dizzy. Please call daughter about sooner appt.

## 2018-04-10 NOTE — Telephone Encounter (Signed)
The patient has an appointment tomorrow with [name].

## 2018-04-11 ENCOUNTER — Ambulatory Visit (INDEPENDENT_AMBULATORY_CARE_PROVIDER_SITE_OTHER): Payer: Medicare Other | Admitting: Physician Assistant

## 2018-04-11 ENCOUNTER — Encounter: Payer: Self-pay | Admitting: Physician Assistant

## 2018-04-11 VITALS — Ht 69.0 in | Wt 143.7 lb

## 2018-04-11 DIAGNOSIS — I5032 Chronic diastolic (congestive) heart failure: Secondary | ICD-10-CM

## 2018-04-11 DIAGNOSIS — I441 Atrioventricular block, second degree: Secondary | ICD-10-CM | POA: Diagnosis not present

## 2018-04-11 DIAGNOSIS — I350 Nonrheumatic aortic (valve) stenosis: Secondary | ICD-10-CM | POA: Diagnosis not present

## 2018-04-11 DIAGNOSIS — E7800 Pure hypercholesterolemia, unspecified: Secondary | ICD-10-CM | POA: Diagnosis not present

## 2018-04-11 DIAGNOSIS — I214 Non-ST elevation (NSTEMI) myocardial infarction: Secondary | ICD-10-CM | POA: Diagnosis not present

## 2018-04-11 DIAGNOSIS — E1122 Type 2 diabetes mellitus with diabetic chronic kidney disease: Secondary | ICD-10-CM | POA: Diagnosis not present

## 2018-04-11 DIAGNOSIS — R42 Dizziness and giddiness: Secondary | ICD-10-CM | POA: Diagnosis not present

## 2018-04-11 DIAGNOSIS — I10 Essential (primary) hypertension: Secondary | ICD-10-CM

## 2018-04-11 DIAGNOSIS — I2510 Atherosclerotic heart disease of native coronary artery without angina pectoris: Secondary | ICD-10-CM | POA: Diagnosis not present

## 2018-04-11 DIAGNOSIS — N185 Chronic kidney disease, stage 5: Secondary | ICD-10-CM | POA: Diagnosis not present

## 2018-04-11 DIAGNOSIS — I5033 Acute on chronic diastolic (congestive) heart failure: Secondary | ICD-10-CM | POA: Diagnosis not present

## 2018-04-11 DIAGNOSIS — I132 Hypertensive heart and chronic kidney disease with heart failure and with stage 5 chronic kidney disease, or end stage renal disease: Secondary | ICD-10-CM | POA: Diagnosis not present

## 2018-04-11 NOTE — Progress Notes (Signed)
Cardiology Office Note:    Date:  04/11/2018   ID:  Henry [name], DOB 12-Apr-1924, MRN [id number]  PCP:  [name], MD  Cardiologist:  Sherren Mocha, MD    Electrophysiologist:  None  Nephrologist: Dr. Marval Regal  Referring MD: [name], MD   Chief Complaint  Patient presents with  . Hospitalization Follow-up    NSTEMI, CHF     History of Present Illness:    Henry [name] is a 85 y.o. male with coronary artery disease status post CABG in 2000, moderate aortic valve stenosis, peripheral arterial disease with claudication managed medically secondary to age and chronic kidney disease, diabetes, hypertension, hyperlipidemia.  He was last seen by Dr. Burt Knack in November 2019.  He underwent placement of AV fistula by Dr. Trula Slade in January 2020.  He was noted to have second-degree AV block type I (Mobitz 1).  Dr. Burt Knack was contacted and his beta-blocker was stopped.  A Holter monitor was recommended.  However, the patient was admitted 1/28-2/5 with non-ST elevation myocardial infarction in the setting of acute diastolic heart failure and stage V chronic kidney disease.  He was followed closely by nephrology and diuresed with Lasix.  Cardiac catheterization demonstrated severe three-vessel CAD with total occlusion of the RCA and LAD and severe stenosis of the LCx.  LIMA-LAD and SVG-diagonal were patent.  SVG-RCA and SVG-OM1 were both occluded.  Occlusions of the vein grafts appeared to be chronic.  There was no clear culprit lesion and there was no target for PCI.  Medical therapy was continued.  Echocardiogram demonstrated EF 50-55% and moderate aortic stenosis.  The patient did have some difficulty with dizziness related to orthostatic hypotension.  His diuretic was held with improved symptoms.  This was resumed prior to discharge without recurrent dizziness.  He remained off of beta-blocker secondary to prior history of Mobitz 1.  Mr. Henry [name] returns for follow-up.  He is here with  his daughter.  He has continued to have episodes of dizziness.  This only occurs with standing.  Some days he has dizziness.  Other days, he does not have dizziness.  He denies syncope.  He denies chest discomfort.  He denies significant shortness of breath.  He denies PND or lower extremity swelling.  His weights have been fairly stable since discharge from the hospital.  His weights have actually gone down some.  He sees his nephrologist next week.   Prior CV studies:   The following studies were reviewed today:  Cardiac catheterization 03/27/2018 LAD proximal 100 OM1 ostial 90 RCA ostial 100 (left to right collaterals) SVG-RPDA 100 SVG-OM1 100 SVG-D1 patent SVG-LAD patent 1. Severe native 3 vessel disease with total occlusion of the RCA and LAD, severe stenosis of the left circumflex 2. S/P remote CABG with continued patency of the LIMA-LAD and SVG-diagonal, total occlusion of the SVG-RCA and SVG-PDA  Recommend medical management of CAD. No clear culprit lesion. Both SVG occlusions appear chronic. No targets for PCI.    Echocardiogram 03/26/2018 EF 50-55, prob inf-lat and lat HK, mild to mod MR, mod AS (AVA 0.93; mean 19.5; peak 36.7)  LE Venous US 03/25/2018 Summary: Right: There is no evidence of deep vein thrombosis in the lower extremity. No cystic structure found in the popliteal fossa. Left: There is no evidence of deep vein thrombosis in the lower extremity. No cystic structure found in the popliteal fossa.  ABI/Arterial US 11/15/17 Final Interpretation: Right: Resting right ankle-brachial index indicates mild right lower extremity  arterial disease. The right toe-brachial index is abnormal. Left: Resting left ankle-brachial index indicates moderate left lower extremity arterial disease. The left toe-brachial index is abnormal. *See table(s) above for measurements and observations. Suggest follow up study in 12 months.  Aorta US 11/15/17 Final Interpretation: Abdominal Aorta:  Aortoiliac atherosclerosis without focal stenosis. No evidence of an abdominal aortic aneurysm was visualized. The largest aortic measurement is 2.6 cm.  Echo 10/30/17 EF 60-65, mod AS (mean 22, peak 39), mild MR  Past Medical History:  Diagnosis Date  . Anemia   . CAD of autologous bypass graft   . Carotid artery disease ([place])   . Diabetes mellitus without complication ([place])   . DJD (degenerative joint disease)   . History of anemia of chronic disease   . Hypercholesteremia   . Hypertension   . PVD (peripheral vascular disease) with claudication ([place])   . Renal insufficiency   . Wears dentures   . Wears glasses    Surgical Hx: The patient  has a past surgical history that includes cataract surgery; Coronary artery bypass graft (2000); Tonsillectomy; Colonoscopy; Lumbar laminectomy/decompression microdiscectomy (N/A, 09/10/2012); Multiple tooth extractions; AV fistula placement (Left, 03/20/2018); and LEFT HEART CATH AND CORS/GRAFTS ANGIOGRAPHY (N/A, 03/27/2018).   Current Medications: Current Meds  Medication Sig  . amLODipine (NORVASC) 5 MG tablet Take 1 tablet (5 mg total) by mouth every evening.  Marland Kitchen aspirin EC 81 MG tablet Take 81 mg by mouth daily.   Marland Kitchen atorvastatin (LIPITOR) 40 MG tablet Take 1 tablet (40 mg total) by mouth every evening.  . calcium acetate (PHOSLO) 667 MG capsule Take 667 mg by mouth 2 (two) times daily.  . clopidogrel (PLAVIX) 75 MG tablet Take 1 tablet (75 mg total) by mouth daily.  . Darbepoetin Alfa (ARANESP) 100 MCG/0.5ML SOSY injection Inject 100 mcg into the skin.  . ferrous gluconate (FERGON) 325 MG tablet Take 325 mg by mouth daily.   . furosemide (LASIX) 40 MG tablet Take 40 mg by mouth daily.  Marland Kitchen glucose blood (ACCU-CHEK AVIVA PLUS) test strip CHECK BLOOD SUGAR TWICE A DAY.  . hydrALAZINE (APRESOLINE) 25 MG tablet Take 1 tablet (25 mg total) by mouth 2 (two) times daily.  . Lancets (ACCU-CHEK MULTICLIX) lancets USE TWICE DAILY.  Marland Kitchen levothyroxine  (SYNTHROID, LEVOTHROID) 50 MCG tablet TAKE ONE TABLET BY MOUTH ONCE DAILY BEFORE BREAKFAST  . Multiple Vitamin (MULTIVITAMIN WITH MINERALS) TABS tablet Take 1 tablet by mouth daily. Centrum Silver Vitamin C  . Multiple Vitamins-Minerals (PRESERVISION AREDS 2 PO) Take 1 tablet by mouth daily.  . traMADol (ULTRAM) 50 MG tablet Take 1 tablet (50 mg total) by mouth every 6 (six) hours as needed.  . Vitamin D, Ergocalciferol, (DRISDOL) 1.25 MG (50000 UT) CAPS capsule Take 50,000 Units by mouth 2 (two) times a week. Mondays & Thursdays     Allergies:   Patient has no known allergies.   Social History   Tobacco Use  . Smoking status: Former Smoker    Packs/day: 1.00    Years: 10.00    Pack years: 10.00    Types: Cigarettes    Last attempt to quit: 02/26/1953    Years since quitting: 65.1  . Smokeless tobacco: Never Used  Substance Use Topics  . Alcohol use: Not Currently    Alcohol/week: 0.0 standard drinks  . Drug use: No     Family Hx: The patient's family history includes Heart disease in his father and mother.  ROS:   Please see the history  of present illness.    ROS All other systems reviewed and are negative.   EKGs/Labs/Other Test Reviewed:    EKG:  EKG is  ordered today.  The ekg ordered today demonstrates normal sinus rhythm, heart rate 74, normal axis, nonspecific ST-T wave changes, first-degree AV block, PR 346, QTC 432   Recent Labs: 01/01/2018: TSH 4.34 03/25/2018: ALT 15; B Natriuretic Peptide 669.1 03/31/2018: Magnesium 2.1 04/08/2018: BUN 56; Creatinine, Ser 2.74; Hemoglobin 10.4; Platelets 437.0; Potassium 4.8; Sodium 135   Recent Lipid Panel Lab Results  Component Value Date/Time   CHOL 164 01/22/2017 11:55 AM   TRIG 110.0 01/22/2017 11:55 AM   HDL 45.20 01/22/2017 11:55 AM   CHOLHDL 4 01/22/2017 11:55 AM   LDLCALC 97 01/22/2017 11:55 AM   LDLDIRECT 96.2 04/04/2007 10:49 AM    Physical Exam:    VS:  Ht 5\' 9"  (1.753 m)   Wt 143 lb 11.2 oz (65.2 kg)   SpO2  96%   BMI 21.22 kg/m     Orthostatic VS for the past 24 hrs (Last 3 readings):  BP- Lying Pulse- Lying BP- Sitting Pulse- Sitting BP- Standing at 0 minutes Pulse- Standing at 0 minutes BP- Standing at 3 minutes Pulse- Standing at 3 minutes  04/11/18 0930 152/62 74 152/60 75 144/53 77 148/56 77     Wt Readings from Last 3 Encounters:  04/11/18 143 lb 11.2 oz (65.2 kg)  04/08/18 144 lb (65.3 kg)  04/02/18 140 lb 8 oz (63.7 kg)     Physical Exam  Constitutional: He is oriented to person, place, and time. He appears well-developed and well-nourished. No distress.  HENT:  Head: Normocephalic and atraumatic.  Eyes: No scleral icterus.  Neck: No thyromegaly present.  Cardiovascular: Normal rate and regular rhythm.  Murmur heard.  Crescendo-decrescendo systolic murmur is present with a grade of 3/6 at the upper right sternal border and upper left sternal border. Pulmonary/Chest: Effort normal. He has no rales.  Abdominal: Soft.  Musculoskeletal:        General: No edema.  Lymphadenopathy:    He has no cervical adenopathy.  Neurological: He is alert and oriented to person, place, and time.  Skin: Skin is warm and dry.  Psychiatric: He has a normal mood and affect.    ASSESSMENT & PLAN:    NSTEMI (non-ST elevation myocardial infarction) [facility]) Recent admission with non-ST elevation myocardial infarction and acute diastolic heart failure.  Cardiac catheterization demonstrated 2/4 grafts patent.  There were no targets for PCI and medical therapy was continued.  Continue aspirin, Plavix.  He is not on beta-blocker secondary to prior history of Mobitz type I.  Continue high-dose statin.  Arrange follow-up lipids and LFTs in 3 months.  Dizziness He had issues with orthostatic hypotension in the hospital.  Orthostatic vital signs today do not demonstrate a significant blood pressure drop.  However, I suspect he still has issues with this at times.  I have recommended that he wear compression  stockings daily.  If his symptoms resolve with doing this, we can then increase his hypertensive regimen to better control his blood pressure.  Chronic diastolic CHF (congestive heart failure) (HCC) Volume appears fairly stable since discharge.  He remains on furosemide 40 mg daily.  Continue current therapy.  Aortic valve stenosis, etiology of cardiac valve disease unspecified Moderate by recent echocardiogram.  CKD (chronic kidney disease) stage 5, GFR less than 15 ml/min (HCC) Recent creatinine improved.  He has follow-up with nephrology next week.  Mobitz type 1 second degree atrioventricular block No apparent recurrence.  At this point, I would avoid resuming beta-blocker therapy.  Essential hypertension Blood pressure above target.  As noted, I have asked him to wear compression stockings on a daily basis.  He sees nephrology next week.  If his symptoms are improved and his blood pressure remains elevated, consideration could be given towards increasing his amlodipine or his hydralazine.   Dispo:  Return in about 4 weeks (around 05/09/2018) for Close Follow Up, w/ Dr. Burt Knack, or [name], PA-C.   Medication Adjustments/Labs and Tests Ordered: Current medicines are reviewed at length with the patient today.  Concerns regarding medicines are outlined above.  Tests Ordered: Orders Placed This Encounter  Procedures  . Hepatic function panel  . Lipid panel   Medication Changes: No orders of the defined types were placed in this encounter.   Signed, [name], PA-C  04/11/2018 2:20 PM    [facility] [address], [place], [place]  [address] Phone: [phone]; Fax: [phone]

## 2018-04-11 NOTE — Patient Instructions (Addendum)
Medication Instructions:  Your physician recommends that you continue on your current medications as directed. Please refer to the Current Medication list given to you today.  If you need a refill on your cardiac medications before your next appointment, please call your pharmacy.   Lab work: Your physician recommends that you return for a FASTING lipid profile and Heaptic Function test in 3 months   If you have labs (blood work) drawn today and your tests are completely normal, you will receive your results only by: Marland Kitchen MyChart Message (if you have MyChart) OR . A paper copy in the mail If you have any lab test that is abnormal or we need to change your treatment, we will call you to review the results.  Testing/Procedures: None  Follow-Up: At [facility], you and your health needs are our priority.  As part of our continuing mission to provide you with exceptional heart care, we have created designated Provider Care Teams.  These Care Teams include your primary Cardiologist (physician) and Advanced Practice Providers (APPs -  Physician Assistants and Nurse Practitioners) who all work together to provide you with the care you need, when you need it. You will need a follow up appointment in:  1 months.  You may see Sherren Mocha, MDor one of the following Advanced Practice Providers on your designated Care Team: [name], PA-C on the same day Dr. Burt Knack is in office    Any Other Special Instructions Will Be Listed Below (If Applicable).  [place]

## 2018-04-12 DIAGNOSIS — E1122 Type 2 diabetes mellitus with diabetic chronic kidney disease: Secondary | ICD-10-CM | POA: Diagnosis not present

## 2018-04-12 DIAGNOSIS — I132 Hypertensive heart and chronic kidney disease with heart failure and with stage 5 chronic kidney disease, or end stage renal disease: Secondary | ICD-10-CM | POA: Diagnosis not present

## 2018-04-12 DIAGNOSIS — I5033 Acute on chronic diastolic (congestive) heart failure: Secondary | ICD-10-CM | POA: Diagnosis not present

## 2018-04-12 DIAGNOSIS — N185 Chronic kidney disease, stage 5: Secondary | ICD-10-CM | POA: Diagnosis not present

## 2018-04-12 DIAGNOSIS — I2510 Atherosclerotic heart disease of native coronary artery without angina pectoris: Secondary | ICD-10-CM | POA: Diagnosis not present

## 2018-04-12 DIAGNOSIS — I214 Non-ST elevation (NSTEMI) myocardial infarction: Secondary | ICD-10-CM | POA: Diagnosis not present

## 2018-04-14 DIAGNOSIS — E1122 Type 2 diabetes mellitus with diabetic chronic kidney disease: Secondary | ICD-10-CM | POA: Diagnosis not present

## 2018-04-14 DIAGNOSIS — I350 Nonrheumatic aortic (valve) stenosis: Secondary | ICD-10-CM | POA: Diagnosis not present

## 2018-04-14 DIAGNOSIS — I132 Hypertensive heart and chronic kidney disease with heart failure and with stage 5 chronic kidney disease, or end stage renal disease: Secondary | ICD-10-CM | POA: Diagnosis not present

## 2018-04-14 DIAGNOSIS — N179 Acute kidney failure, unspecified: Secondary | ICD-10-CM | POA: Diagnosis not present

## 2018-04-14 DIAGNOSIS — N184 Chronic kidney disease, stage 4 (severe): Secondary | ICD-10-CM | POA: Diagnosis not present

## 2018-04-14 DIAGNOSIS — Z7982 Long term (current) use of aspirin: Secondary | ICD-10-CM | POA: Diagnosis not present

## 2018-04-14 DIAGNOSIS — I129 Hypertensive chronic kidney disease with stage 1 through stage 4 chronic kidney disease, or unspecified chronic kidney disease: Secondary | ICD-10-CM | POA: Diagnosis not present

## 2018-04-14 DIAGNOSIS — I2510 Atherosclerotic heart disease of native coronary artery without angina pectoris: Secondary | ICD-10-CM | POA: Diagnosis not present

## 2018-04-14 DIAGNOSIS — R809 Proteinuria, unspecified: Secondary | ICD-10-CM | POA: Diagnosis not present

## 2018-04-14 DIAGNOSIS — D631 Anemia in chronic kidney disease: Secondary | ICD-10-CM | POA: Diagnosis not present

## 2018-04-14 DIAGNOSIS — I739 Peripheral vascular disease, unspecified: Secondary | ICD-10-CM | POA: Diagnosis not present

## 2018-04-14 DIAGNOSIS — I5033 Acute on chronic diastolic (congestive) heart failure: Secondary | ICD-10-CM | POA: Diagnosis not present

## 2018-04-14 DIAGNOSIS — I214 Non-ST elevation (NSTEMI) myocardial infarction: Secondary | ICD-10-CM | POA: Diagnosis not present

## 2018-04-14 DIAGNOSIS — N2581 Secondary hyperparathyroidism of renal origin: Secondary | ICD-10-CM | POA: Diagnosis not present

## 2018-04-14 DIAGNOSIS — N185 Chronic kidney disease, stage 5: Secondary | ICD-10-CM

## 2018-04-15 DIAGNOSIS — N185 Chronic kidney disease, stage 5: Secondary | ICD-10-CM | POA: Diagnosis not present

## 2018-04-15 DIAGNOSIS — I5033 Acute on chronic diastolic (congestive) heart failure: Secondary | ICD-10-CM | POA: Diagnosis not present

## 2018-04-15 DIAGNOSIS — E1122 Type 2 diabetes mellitus with diabetic chronic kidney disease: Secondary | ICD-10-CM | POA: Diagnosis not present

## 2018-04-15 DIAGNOSIS — I132 Hypertensive heart and chronic kidney disease with heart failure and with stage 5 chronic kidney disease, or end stage renal disease: Secondary | ICD-10-CM | POA: Diagnosis not present

## 2018-04-15 DIAGNOSIS — I214 Non-ST elevation (NSTEMI) myocardial infarction: Secondary | ICD-10-CM | POA: Diagnosis not present

## 2018-04-15 DIAGNOSIS — I2510 Atherosclerotic heart disease of native coronary artery without angina pectoris: Secondary | ICD-10-CM | POA: Diagnosis not present

## 2018-04-16 DIAGNOSIS — I132 Hypertensive heart and chronic kidney disease with heart failure and with stage 5 chronic kidney disease, or end stage renal disease: Secondary | ICD-10-CM | POA: Diagnosis not present

## 2018-04-16 DIAGNOSIS — I2510 Atherosclerotic heart disease of native coronary artery without angina pectoris: Secondary | ICD-10-CM | POA: Diagnosis not present

## 2018-04-16 DIAGNOSIS — I5033 Acute on chronic diastolic (congestive) heart failure: Secondary | ICD-10-CM | POA: Diagnosis not present

## 2018-04-16 DIAGNOSIS — N185 Chronic kidney disease, stage 5: Secondary | ICD-10-CM | POA: Diagnosis not present

## 2018-04-16 DIAGNOSIS — I214 Non-ST elevation (NSTEMI) myocardial infarction: Secondary | ICD-10-CM | POA: Diagnosis not present

## 2018-04-16 DIAGNOSIS — E1122 Type 2 diabetes mellitus with diabetic chronic kidney disease: Secondary | ICD-10-CM | POA: Diagnosis not present

## 2018-04-16 NOTE — Addendum Note (Signed)
Addended by: Briant Cedar on: 04/16/2018 11:32 AM   Modules accepted: Orders

## 2018-04-18 DIAGNOSIS — I5033 Acute on chronic diastolic (congestive) heart failure: Secondary | ICD-10-CM | POA: Diagnosis not present

## 2018-04-18 DIAGNOSIS — I2510 Atherosclerotic heart disease of native coronary artery without angina pectoris: Secondary | ICD-10-CM | POA: Diagnosis not present

## 2018-04-18 DIAGNOSIS — I214 Non-ST elevation (NSTEMI) myocardial infarction: Secondary | ICD-10-CM | POA: Diagnosis not present

## 2018-04-18 DIAGNOSIS — N185 Chronic kidney disease, stage 5: Secondary | ICD-10-CM | POA: Diagnosis not present

## 2018-04-18 DIAGNOSIS — I132 Hypertensive heart and chronic kidney disease with heart failure and with stage 5 chronic kidney disease, or end stage renal disease: Secondary | ICD-10-CM | POA: Diagnosis not present

## 2018-04-18 DIAGNOSIS — E1122 Type 2 diabetes mellitus with diabetic chronic kidney disease: Secondary | ICD-10-CM | POA: Diagnosis not present

## 2018-04-22 ENCOUNTER — Other Ambulatory Visit: Payer: Self-pay

## 2018-04-22 ENCOUNTER — Telehealth: Payer: Self-pay | Admitting: Cardiovascular Disease

## 2018-04-22 DIAGNOSIS — I5033 Acute on chronic diastolic (congestive) heart failure: Secondary | ICD-10-CM | POA: Diagnosis not present

## 2018-04-22 DIAGNOSIS — N184 Chronic kidney disease, stage 4 (severe): Secondary | ICD-10-CM

## 2018-04-22 DIAGNOSIS — N185 Chronic kidney disease, stage 5: Secondary | ICD-10-CM

## 2018-04-22 DIAGNOSIS — E1122 Type 2 diabetes mellitus with diabetic chronic kidney disease: Secondary | ICD-10-CM | POA: Diagnosis not present

## 2018-04-22 DIAGNOSIS — I214 Non-ST elevation (NSTEMI) myocardial infarction: Secondary | ICD-10-CM | POA: Diagnosis not present

## 2018-04-22 DIAGNOSIS — I132 Hypertensive heart and chronic kidney disease with heart failure and with stage 5 chronic kidney disease, or end stage renal disease: Secondary | ICD-10-CM | POA: Diagnosis not present

## 2018-04-22 DIAGNOSIS — I2510 Atherosclerotic heart disease of native coronary artery without angina pectoris: Secondary | ICD-10-CM | POA: Diagnosis not present

## 2018-04-22 NOTE — Telephone Encounter (Signed)
error 

## 2018-04-23 ENCOUNTER — Ambulatory Visit: Payer: Medicare Other | Admitting: Physician Assistant

## 2018-04-24 DIAGNOSIS — N185 Chronic kidney disease, stage 5: Secondary | ICD-10-CM | POA: Diagnosis not present

## 2018-04-24 DIAGNOSIS — I5033 Acute on chronic diastolic (congestive) heart failure: Secondary | ICD-10-CM | POA: Diagnosis not present

## 2018-04-24 DIAGNOSIS — I2510 Atherosclerotic heart disease of native coronary artery without angina pectoris: Secondary | ICD-10-CM | POA: Diagnosis not present

## 2018-04-24 DIAGNOSIS — E1122 Type 2 diabetes mellitus with diabetic chronic kidney disease: Secondary | ICD-10-CM | POA: Diagnosis not present

## 2018-04-24 DIAGNOSIS — I214 Non-ST elevation (NSTEMI) myocardial infarction: Secondary | ICD-10-CM | POA: Diagnosis not present

## 2018-04-24 DIAGNOSIS — I132 Hypertensive heart and chronic kidney disease with heart failure and with stage 5 chronic kidney disease, or end stage renal disease: Secondary | ICD-10-CM | POA: Diagnosis not present

## 2018-04-28 ENCOUNTER — Other Ambulatory Visit: Payer: Self-pay

## 2018-04-28 ENCOUNTER — Encounter: Payer: Self-pay | Admitting: Surgery

## 2018-04-28 ENCOUNTER — Ambulatory Visit (HOSPITAL_COMMUNITY)
Admission: RE | Admit: 2018-04-28 | Discharge: 2018-04-28 | Disposition: A | Discharge status: Home / Self Care | Payer: Medicare Other | Source: Ambulatory Visit | Attending: Family | Admitting: Family

## 2018-04-28 ENCOUNTER — Ambulatory Visit: Payer: Medicare Other | Admitting: Physician Assistant

## 2018-04-28 ENCOUNTER — Ambulatory Visit (INDEPENDENT_AMBULATORY_CARE_PROVIDER_SITE_OTHER): Payer: Self-pay | Admitting: Surgery

## 2018-04-28 VITALS — BP 138/51 | HR 55 | Temp 97.2°F | Resp 20 | Ht 69.0 in | Wt 145.9 lb

## 2018-04-28 DIAGNOSIS — N184 Chronic kidney disease, stage 4 (severe): Secondary | ICD-10-CM | POA: Diagnosis not present

## 2018-04-28 DIAGNOSIS — I132 Hypertensive heart and chronic kidney disease with heart failure and with stage 5 chronic kidney disease, or end stage renal disease: Secondary | ICD-10-CM | POA: Diagnosis not present

## 2018-04-28 DIAGNOSIS — I5033 Acute on chronic diastolic (congestive) heart failure: Secondary | ICD-10-CM | POA: Diagnosis not present

## 2018-04-28 DIAGNOSIS — E1122 Type 2 diabetes mellitus with diabetic chronic kidney disease: Secondary | ICD-10-CM | POA: Diagnosis not present

## 2018-04-28 DIAGNOSIS — N185 Chronic kidney disease, stage 5: Secondary | ICD-10-CM | POA: Diagnosis not present

## 2018-04-28 DIAGNOSIS — I214 Non-ST elevation (NSTEMI) myocardial infarction: Secondary | ICD-10-CM | POA: Diagnosis not present

## 2018-04-28 DIAGNOSIS — I2510 Atherosclerotic heart disease of native coronary artery without angina pectoris: Secondary | ICD-10-CM | POA: Diagnosis not present

## 2018-04-28 NOTE — Progress Notes (Signed)
Patient name: Henry [name] MRN: [id number] DOB: 20-Jul-1924 Sex: male  REASON FOR VISIT:     post op  HISTORY OF PRESENT ILLNESS:   Henry [name] is a 85 y.o. male who returns, status post left brachiocephalic fistula on [id number].  He was admitted 5 days later with a end STEMI.  He underwent cardiac catheterization and medical management was recommended.  His creatinine returned to baseline prior to discharge.  He still feels pretty weak.  He does complain of his left arm being cool.  He does not have motor or sensory dysfunction.  CURRENT MEDICATIONS:    Current Outpatient Medications  Medication Sig Dispense Refill  . amLODipine (NORVASC) 5 MG tablet Take 1 tablet (5 mg total) by mouth every evening. 30 tablet 0  . aspirin EC 81 MG tablet Take 81 mg by mouth daily.     Marland Kitchen atorvastatin (LIPITOR) 40 MG tablet Take 1 tablet (40 mg total) by mouth every evening. 30 tablet 0  . calcium acetate (PHOSLO) 667 MG capsule Take 667 mg by mouth 2 (two) times daily.    . clopidogrel (PLAVIX) 75 MG tablet Take 1 tablet (75 mg total) by mouth daily. 30 tablet 0  . Darbepoetin Alfa (ARANESP) 100 MCG/0.5ML SOSY injection Inject 100 mcg into the skin.    . ferrous gluconate (FERGON) 325 MG tablet Take 325 mg by mouth daily.     . furosemide (LASIX) 40 MG tablet Take 40 mg by mouth daily.    Marland Kitchen glucose blood (ACCU-CHEK AVIVA PLUS) test strip CHECK BLOOD SUGAR TWICE A DAY. 50 each 5  . hydrALAZINE (APRESOLINE) 25 MG tablet Take 1 tablet (25 mg total) by mouth 2 (two) times daily. 60 tablet 0  . Lancets (ACCU-CHEK MULTICLIX) lancets USE TWICE DAILY. 102 each 5  . levothyroxine (SYNTHROID, LEVOTHROID) 50 MCG tablet TAKE ONE TABLET BY MOUTH ONCE DAILY BEFORE BREAKFAST 90 tablet 3  . Multiple Vitamins-Minerals (PRESERVISION AREDS 2 PO) Take 1 tablet by mouth daily.    . traMADol (ULTRAM) 50 MG tablet Take 1 tablet (50 mg total) by mouth every 6 (six) hours as needed. 20  tablet 0  . Vitamin D, Ergocalciferol, (DRISDOL) 1.25 MG (50000 UT) CAPS capsule Take 50,000 Units by mouth 2 (two) times a week. Mondays & Thursdays    . Multiple Vitamin (MULTIVITAMIN WITH MINERALS) TABS tablet Take 1 tablet by mouth daily. Centrum Silver Vitamin C     No current facility-administered medications for this visit.     REVIEW OF SYSTEMS:   [X]  denotes positive finding, [ ]  denotes negative finding Cardiac  Comments:  Chest pain or chest pressure:    Shortness of breath upon exertion:    Short of breath when lying flat:    Irregular heart rhythm:    Constitutional    Fever or chills:      PHYSICAL EXAM:   There were no vitals filed for this visit.  GENERAL: The patient is a well-nourished male, in no acute distress. The vital signs are documented above. CARDIOVASCULAR: There is a regular rate and rhythm. PULMONARY: Non-labored respirations Palpable thrill within fistula which is tortuous.  Palpable radial pulse, slight bluish discoloration of the first index finger.  STUDIES:   +------------+----------+-------------+----------+----------------+ OUTFLOW VEINPSV (cm/s)Diameter (cm)Depth (cm)    Describe     +------------+----------+-------------+----------+----------------+ Prox UA        108        0.32        0.26                    +------------+----------+-------------+----------+----------------+  Mid UA         163        0.40        0.50   competing branch +------------+----------+-------------+----------+----------------+ Dist UA        161        0.41        0.33                    +------------+----------+-------------+----------+----------------+ AC Fossa       245        0.43        0.23                    +------------+----------+-------------+----------+----------------+       MEDICAL ISSUES:   The patient has a tortuous small fistula.  He is not yet on dialysis.  I recommended repeating an ultrasound in 2 months  and encouraging him to do exercises to see if we can develop this fistula.  He did have a MI 5 days following his surgery, so I want to be relatively conservative at this point.  His creatinine at discharge was at his baseline.  Annamarie Major, MD [facility] [phone] Pager [phone]

## 2018-05-02 ENCOUNTER — Ambulatory Visit (INDEPENDENT_AMBULATORY_CARE_PROVIDER_SITE_OTHER): Payer: Medicare Other | Admitting: Physician Assistant

## 2018-05-02 ENCOUNTER — Encounter: Payer: Self-pay | Admitting: Physician Assistant

## 2018-05-02 VITALS — BP 128/50 | HR 65 | Ht 69.0 in | Wt 145.0 lb

## 2018-05-02 DIAGNOSIS — I5032 Chronic diastolic (congestive) heart failure: Secondary | ICD-10-CM | POA: Diagnosis not present

## 2018-05-02 DIAGNOSIS — I350 Nonrheumatic aortic (valve) stenosis: Secondary | ICD-10-CM

## 2018-05-02 DIAGNOSIS — I10 Essential (primary) hypertension: Secondary | ICD-10-CM

## 2018-05-02 DIAGNOSIS — I2510 Atherosclerotic heart disease of native coronary artery without angina pectoris: Secondary | ICD-10-CM | POA: Diagnosis not present

## 2018-05-02 DIAGNOSIS — N185 Chronic kidney disease, stage 5: Secondary | ICD-10-CM

## 2018-05-02 DIAGNOSIS — I441 Atrioventricular block, second degree: Secondary | ICD-10-CM | POA: Diagnosis not present

## 2018-05-02 NOTE — Patient Instructions (Signed)
Medication Instructions:  Your physician recommends that you continue on your current medications as directed. Please refer to the Current Medication list given to you today.   If you need a refill on your cardiac medications before your next appointment, please call your pharmacy.   Lab work: NONE  If you have labs (blood work) drawn today and your tests are completely normal, you will receive your results only by: Marland Kitchen MyChart Message (if you have MyChart) OR . A paper copy in the mail If you have any lab test that is abnormal or we need to change your treatment, we will call you to review the results.  Testing/Procedures: NONE  Follow-Up: At [facility], you and your health needs are our priority.  As part of our continuing mission to provide you with exceptional heart care, we have created designated Provider Care Teams.  These Care Teams include your primary Cardiologist (physician) and Advanced Practice Providers (APPs -  Physician Assistants and Nurse Practitioners) who all work together to provide you with the care you need, when you need it. . You will need a follow up appointment in:  3 months.   You may see Sherren Mocha, MD or one of the following Advanced Practice Providers on your designated Care Team: . [name], PA-C . Vin Bhagat, PA-C . Daune Perch, NP  Any Other Special Instructions Will Be Listed Below (If Applicable).

## 2018-05-02 NOTE — Progress Notes (Signed)
Cardiology Office Note:    Date:  05/02/2018   ID:  Henry [name], DOB May 12, 1924, MRN [id number]  PCP:  [name], MD  Cardiologist:  Sherren Mocha, MD   Electrophysiologist:  None  Nephrologist: Dr. Marval Regal  Referring MD: [name], MD   Chief Complaint  Patient presents with  . Follow-up    CAD, AS, HTN     History of Present Illness:    Henry [name] is a 85 y.o. male with coronary artery disease status post CABG in 2000, moderate aortic valve stenosis, peripheral arterial disease with claudication managed medically secondary to age and chronic kidney disease, diabetes, hypertension, hyperlipidemia.    During placement of an AV fistula in January 2020, he developed second-degree AV block type I.  His beta-blocker was stopped.  He then was admitted in late January 2020 with a non-ST elevation myocardial infarction in the setting of acute diastolic heart failure and stage V chronic kidney disease.  Cardiac catheterization demonstrated patent LIMA-LAD and SVG-diagonal.  SVG-RCA and SVG-OM1 were both occluded.  Vein graft occlusions appeared to be chronic and there was no culprit for his MI.  Medical therapy was continued.  He was last seen 04/11/2018.  He continued to have symptoms of orthostatic intolerance.  I recommended compression stockings.  Henry [name] returns for follow up.  He is here with his daughter.  He has been doing well since last seen.  He has not had any further dizziness.  He has not had chest pain.  His breathing is stable.  He has not had significant leg swelling.  His AVF is still healing.  His L arm feels cold and he has been doing hand exercise to help with the healing of the AVF.   Prior CV studies:   The following studies were reviewed today:  Cardiac catheterization 03/27/2018 LAD proximal 100 OM1 ostial 90 RCA ostial 100 (left to right collaterals) SVG-RPDA 100 SVG-OM1 100 SVG-D1 patent SVG-LAD patent 1. Severe native 3 vessel disease  with total occlusion of the RCA and LAD, severe stenosis of the left circumflex 2. S/P remote CABG with continued patency of the LIMA-LAD and SVG-diagonal, total occlusion of the SVG-RCA and SVG-PDA  Recommend medical management of CAD. No clear culprit lesion. Both SVG occlusions appear chronic. No targets for PCI.    Echocardiogram 03/26/2018 EF 50-55, prob inf-lat and lat HK, mild to mod MR, mod AS (AVA 0.93; mean 19.5; peak 36.7)  LE Venous US 03/25/2018 Summary: Right: There is no evidence of deep vein thrombosis in the lower extremity. No cystic structure found in the popliteal fossa. Left: There is no evidence of deep vein thrombosis in the lower extremity. No cystic structure found in the popliteal fossa.  ABI/Arterial US 11/15/17 Final Interpretation: Right: Resting right ankle-brachial index indicates mild right lower extremity arterial disease. The right toe-brachial index is abnormal. Left: Resting left ankle-brachial index indicates moderate left lower extremity arterial disease. The left toe-brachial index is abnormal. *See table(s) above for measurements and observations. Suggest follow up study in 12 months.  Aorta US 11/15/17 Final Interpretation: Abdominal Aorta: Aortoiliac atherosclerosis without focal stenosis. No evidence of an abdominal aortic aneurysm was visualized. The largest aortic measurement is 2.6 cm.  Echo 10/30/17 EF 60-65, mod AS (mean 22, peak 39), mild MR  Past Medical History:  Diagnosis Date  . Anemia   . CAD of autologous bypass graft   . Carotid artery disease ([place])   . Diabetes mellitus without  complication ([place])   . DJD (degenerative joint disease)   . History of anemia of chronic disease   . Hypercholesteremia   . Hypertension   . NSTEMI (non-ST elevated myocardial infarction) ([place])   . PVD (peripheral vascular disease) with claudication ([place])   . Renal insufficiency   . Wears dentures   . Wears glasses    Surgical Hx: The patient   has a past surgical history that includes cataract surgery; Coronary artery bypass graft (2000); Tonsillectomy; Colonoscopy; Lumbar laminectomy/decompression microdiscectomy (N/A, 09/10/2012); Multiple tooth extractions; AV fistula placement (Left, 03/20/2018); and LEFT HEART CATH AND CORS/GRAFTS ANGIOGRAPHY (N/A, 03/27/2018).   Current Medications: Current Meds  Medication Sig  . amLODipine (NORVASC) 5 MG tablet Take 1 tablet (5 mg total) by mouth every evening.  Marland Kitchen aspirin EC 81 MG tablet Take 81 mg by mouth daily.   Marland Kitchen atorvastatin (LIPITOR) 40 MG tablet Take 1 tablet (40 mg total) by mouth every evening.  . clopidogrel (PLAVIX) 75 MG tablet Take 1 tablet (75 mg total) by mouth daily.  . Darbepoetin Alfa (ARANESP) 100 MCG/0.5ML SOSY injection Inject 100 mcg into the skin.  . ferrous gluconate (FERGON) 325 MG tablet Take 325 mg by mouth daily.   . furosemide (LASIX) 40 MG tablet Take 40 mg by mouth daily.  Marland Kitchen glucose blood (ACCU-CHEK AVIVA PLUS) test strip CHECK BLOOD SUGAR TWICE A DAY.  . hydrALAZINE (APRESOLINE) 25 MG tablet Take 1 tablet (25 mg total) by mouth 2 (two) times daily.  . Lancets (ACCU-CHEK MULTICLIX) lancets USE TWICE DAILY.  Marland Kitchen levothyroxine (SYNTHROID, LEVOTHROID) 50 MCG tablet TAKE ONE TABLET BY MOUTH ONCE DAILY BEFORE BREAKFAST  . Multiple Vitamins-Minerals (PRESERVISION AREDS 2 PO) Take 1 tablet by mouth daily.  . traMADol (ULTRAM) 50 MG tablet Take 1 tablet (50 mg total) by mouth every 6 (six) hours as needed.  . Vitamin D, Ergocalciferol, (DRISDOL) 1.25 MG (50000 UT) CAPS capsule Take 50,000 Units by mouth 2 (two) times a week. Mondays & Thursdays     Allergies:   Patient has no known allergies.   Social History   Tobacco Use  . Smoking status: Former Smoker    Packs/day: 1.00    Years: 10.00    Pack years: 10.00    Types: Cigarettes    Last attempt to quit: 02/26/1953    Years since quitting: 65.2  . Smokeless tobacco: Never Used  Substance Use Topics  . Alcohol  use: Not Currently    Alcohol/week: 0.0 standard drinks  . Drug use: No     Family Hx: The patient's family history includes Heart disease in his father and mother.  ROS:   Please see the history of present illness.    Review of Systems  Constitution: Positive for chills and malaise/fatigue.  Neurological: Positive for loss of balance.   All other systems reviewed and are negative.   EKGs/Labs/Other Test Reviewed:    EKG:  EKG is not ordered today.    Recent Labs: 01/01/2018: TSH 4.34 03/25/2018: ALT 15; B Natriuretic Peptide 669.1 03/31/2018: Magnesium 2.1 04/08/2018: BUN 56; Creatinine, Ser 2.74; Hemoglobin 10.4; Platelets 437.0; Potassium 4.8; Sodium 135   Recent Lipid Panel Lab Results  Component Value Date/Time   CHOL 164 01/22/2017 11:55 AM   TRIG 110.0 01/22/2017 11:55 AM   HDL 45.20 01/22/2017 11:55 AM   CHOLHDL 4 01/22/2017 11:55 AM   LDLCALC 97 01/22/2017 11:55 AM   LDLDIRECT 96.2 04/04/2007 10:49 AM    Physical Exam:  VS:  BP (!) 128/50   Pulse 65   Ht 5\' 9"  (1.753 m)   Wt 145 lb (65.8 kg)   BMI 21.41 kg/m     Wt Readings from Last 3 Encounters:  05/02/18 145 lb (65.8 kg)  04/28/18 145 lb 15.1 oz (66.2 kg)  04/11/18 143 lb 11.2 oz (65.2 kg)     Physical Exam  Constitutional: He is oriented to person, place, and time. He appears well-developed and well-nourished.  HENT:  Head: Normocephalic and atraumatic.  Eyes: No scleral icterus.  Neck: No thyromegaly present.  Cardiovascular: Normal rate and regular rhythm.  No murmur heard. Pulmonary/Chest: He has no wheezes. He has no rales.  Abdominal: Soft.  Musculoskeletal:        General: No edema.  Lymphadenopathy:    He has no cervical adenopathy.  Neurological: He is alert and oriented to person, place, and time.  Skin: Skin is warm and dry.  Psychiatric: He has a normal mood and affect.    ASSESSMENT & PLAN:    Chronic diastolic CHF (congestive heart failure) (HCC) Volume stable.  NYHA 2.   Continue current medical regimen.   Coronary artery disease involving native coronary artery of native heart without angina pectoris Admitted 02/2018 with a non-ST elevation myocardial infarction and acute diastolic heart failure.  Cardiac catheterization demonstrated 2/4 grafts patent and there were no targets for PCI.  Medical therapy was continued.  He is doing well without angina.  Continue ASA, Clopidogrel, Atorvastatin.  He is not on beta-blocker due to hx of Mobitz 1 at the time of his AVF surgery.   Aortic valve stenosis, unspecified etiology Mod by Echo in 02/2018.    CKD (chronic kidney disease) stage 5, GFR less than 15 ml/min [facility]) He is s/p AVF and is followed by nephrology.    Mobitz type 1 second degree atrioventricular block He is off beta-blocker therapy.  Tele summary in his chart is personally reviewed today.  There was no arrhythmia noted during recent hospitalization.    Essential hypertension The patient's blood pressure is controlled on his current regimen.  Continue current therapy.     Dispo:  Return in about 3 months (around 08/02/2018) for Routine Follow Up, w/ Dr. Burt Knack.   Medication Adjustments/Labs and Tests Ordered: Current medicines are reviewed at length with the patient today.  Concerns regarding medicines are outlined above.  Tests Ordered: No orders of the defined types were placed in this encounter.  Medication Changes: No orders of the defined types were placed in this encounter.   Signed, [name], PA-C  05/02/2018 1:35 PM    [facility] [address], [place], [place]  [address] Phone: [phone]; Fax: [phone]

## 2018-05-04 DIAGNOSIS — E1122 Type 2 diabetes mellitus with diabetic chronic kidney disease: Secondary | ICD-10-CM | POA: Diagnosis not present

## 2018-05-04 DIAGNOSIS — I350 Nonrheumatic aortic (valve) stenosis: Secondary | ICD-10-CM | POA: Diagnosis not present

## 2018-05-04 DIAGNOSIS — I132 Hypertensive heart and chronic kidney disease with heart failure and with stage 5 chronic kidney disease, or end stage renal disease: Secondary | ICD-10-CM | POA: Diagnosis not present

## 2018-05-04 DIAGNOSIS — Z7982 Long term (current) use of aspirin: Secondary | ICD-10-CM | POA: Diagnosis not present

## 2018-05-04 DIAGNOSIS — I214 Non-ST elevation (NSTEMI) myocardial infarction: Secondary | ICD-10-CM | POA: Diagnosis not present

## 2018-05-04 DIAGNOSIS — N185 Chronic kidney disease, stage 5: Secondary | ICD-10-CM | POA: Diagnosis not present

## 2018-05-04 DIAGNOSIS — I5033 Acute on chronic diastolic (congestive) heart failure: Secondary | ICD-10-CM | POA: Diagnosis not present

## 2018-05-04 DIAGNOSIS — I2510 Atherosclerotic heart disease of native coronary artery without angina pectoris: Secondary | ICD-10-CM | POA: Diagnosis not present

## 2018-05-06 DIAGNOSIS — I2510 Atherosclerotic heart disease of native coronary artery without angina pectoris: Secondary | ICD-10-CM | POA: Diagnosis not present

## 2018-05-06 DIAGNOSIS — I5033 Acute on chronic diastolic (congestive) heart failure: Secondary | ICD-10-CM | POA: Diagnosis not present

## 2018-05-06 DIAGNOSIS — N185 Chronic kidney disease, stage 5: Secondary | ICD-10-CM | POA: Diagnosis not present

## 2018-05-06 DIAGNOSIS — I132 Hypertensive heart and chronic kidney disease with heart failure and with stage 5 chronic kidney disease, or end stage renal disease: Secondary | ICD-10-CM | POA: Diagnosis not present

## 2018-05-06 DIAGNOSIS — I214 Non-ST elevation (NSTEMI) myocardial infarction: Secondary | ICD-10-CM | POA: Diagnosis not present

## 2018-05-06 DIAGNOSIS — E1122 Type 2 diabetes mellitus with diabetic chronic kidney disease: Secondary | ICD-10-CM | POA: Diagnosis not present

## 2018-05-08 DIAGNOSIS — I2510 Atherosclerotic heart disease of native coronary artery without angina pectoris: Secondary | ICD-10-CM | POA: Diagnosis not present

## 2018-05-08 DIAGNOSIS — I5033 Acute on chronic diastolic (congestive) heart failure: Secondary | ICD-10-CM | POA: Diagnosis not present

## 2018-05-08 DIAGNOSIS — I132 Hypertensive heart and chronic kidney disease with heart failure and with stage 5 chronic kidney disease, or end stage renal disease: Secondary | ICD-10-CM | POA: Diagnosis not present

## 2018-05-08 DIAGNOSIS — I214 Non-ST elevation (NSTEMI) myocardial infarction: Secondary | ICD-10-CM | POA: Diagnosis not present

## 2018-05-08 DIAGNOSIS — N185 Chronic kidney disease, stage 5: Secondary | ICD-10-CM | POA: Diagnosis not present

## 2018-05-08 DIAGNOSIS — E1122 Type 2 diabetes mellitus with diabetic chronic kidney disease: Secondary | ICD-10-CM | POA: Diagnosis not present

## 2018-05-12 DIAGNOSIS — N189 Chronic kidney disease, unspecified: Secondary | ICD-10-CM | POA: Diagnosis not present

## 2018-05-12 DIAGNOSIS — N2581 Secondary hyperparathyroidism of renal origin: Secondary | ICD-10-CM | POA: Diagnosis not present

## 2018-05-12 DIAGNOSIS — R809 Proteinuria, unspecified: Secondary | ICD-10-CM | POA: Diagnosis not present

## 2018-05-12 DIAGNOSIS — N184 Chronic kidney disease, stage 4 (severe): Secondary | ICD-10-CM | POA: Diagnosis not present

## 2018-05-13 DIAGNOSIS — L905 Scar conditions and fibrosis of skin: Secondary | ICD-10-CM | POA: Diagnosis not present

## 2018-05-14 DIAGNOSIS — I132 Hypertensive heart and chronic kidney disease with heart failure and with stage 5 chronic kidney disease, or end stage renal disease: Secondary | ICD-10-CM | POA: Diagnosis not present

## 2018-05-14 DIAGNOSIS — I214 Non-ST elevation (NSTEMI) myocardial infarction: Secondary | ICD-10-CM | POA: Diagnosis not present

## 2018-05-14 DIAGNOSIS — E1122 Type 2 diabetes mellitus with diabetic chronic kidney disease: Secondary | ICD-10-CM | POA: Diagnosis not present

## 2018-05-14 DIAGNOSIS — I5033 Acute on chronic diastolic (congestive) heart failure: Secondary | ICD-10-CM | POA: Diagnosis not present

## 2018-05-14 DIAGNOSIS — I2510 Atherosclerotic heart disease of native coronary artery without angina pectoris: Secondary | ICD-10-CM | POA: Diagnosis not present

## 2018-05-14 DIAGNOSIS — N185 Chronic kidney disease, stage 5: Secondary | ICD-10-CM | POA: Diagnosis not present

## 2018-05-27 ENCOUNTER — Telehealth: Payer: Self-pay | Admitting: *Deleted

## 2018-05-27 ENCOUNTER — Telehealth: Payer: Self-pay | Admitting: Cardiovascular Disease

## 2018-05-27 NOTE — Telephone Encounter (Signed)
SPOKE WITH PT AND EXPLAINS FOR PAST COUPLE OF WEEKS HE NOTICED WEIGHT LOST BUT HAS HAD SOME WEIGHT GAIN BACK.  AND PT DOESN'T C/O OF SWELLING OF ANY TO BODY PARTS,CP, OR SOB.  WEIGHT RANGING IN BETWEEN 138 TO 145 LBS. PT CURRENT WEIGHT IS 149.   PT STATES HE  GOES TO THE BATHROOM ABOUT 10 TIMES A DAY.PT IS CURRENTLY TAKING LASIX 40 MG ONCE A DAY.  PT HAS CONCERNS OF CHANGING LASIX DOSE. PT WAS TOLD PROVIDER WOULD BE REACHED FOR FURTHER RECOMMENDATIONS AND HE WILL BE CONTACTED BACK.

## 2018-05-27 NOTE — Telephone Encounter (Signed)
New Message  Pt is calling because he noticed he is loosing some weight and that he goes to the bathroom often. He says he is taking Lasix Medication.  Should he continue with the Lasix? He says he is also taking 40 mg furosemide at this time.   Please call back

## 2018-05-28 DIAGNOSIS — N184 Chronic kidney disease, stage 4 (severe): Secondary | ICD-10-CM | POA: Diagnosis not present

## 2018-05-28 DIAGNOSIS — E1122 Type 2 diabetes mellitus with diabetic chronic kidney disease: Secondary | ICD-10-CM | POA: Diagnosis not present

## 2018-05-28 DIAGNOSIS — I129 Hypertensive chronic kidney disease with stage 1 through stage 4 chronic kidney disease, or unspecified chronic kidney disease: Secondary | ICD-10-CM | POA: Diagnosis not present

## 2018-05-28 DIAGNOSIS — I214 Non-ST elevation (NSTEMI) myocardial infarction: Secondary | ICD-10-CM | POA: Diagnosis not present

## 2018-05-28 DIAGNOSIS — R809 Proteinuria, unspecified: Secondary | ICD-10-CM | POA: Diagnosis not present

## 2018-05-28 DIAGNOSIS — I739 Peripheral vascular disease, unspecified: Secondary | ICD-10-CM | POA: Diagnosis not present

## 2018-05-28 DIAGNOSIS — D631 Anemia in chronic kidney disease: Secondary | ICD-10-CM | POA: Diagnosis not present

## 2018-05-28 DIAGNOSIS — N2581 Secondary hyperparathyroidism of renal origin: Secondary | ICD-10-CM | POA: Diagnosis not present

## 2018-05-28 DIAGNOSIS — N179 Acute kidney failure, unspecified: Secondary | ICD-10-CM | POA: Diagnosis not present

## 2018-05-28 NOTE — Telephone Encounter (Signed)
Called patient at home. He has noted his weight is up and down.  He also urinates several times a day. He is concerned that the dose of Lasix needs to be reduced. He has not had shortness of breath, orthopnea, leg swelling. He has not had dizziness or orthostatic intolerance. He is walking on his treadmill and his energy level is good. He has a tele visit with his nephrologist later today.  PLAN:  1. I recommend he remain on the same dose of Lasix for now.  He agrees. 2. He knows to call if his weight increased > 3 lbs in 1 day. 3. He will d/w Dr. Marval [name] today during his visit.  He did have recent labs and if his creatinine is higher, he may need to reduce Mr. Henry [name] Lasix dose. Henry Dopp, PA-C    05/28/2018 1:01 PM

## 2018-06-24 ENCOUNTER — Other Ambulatory Visit: Payer: Self-pay

## 2018-06-24 DIAGNOSIS — N185 Chronic kidney disease, stage 5: Secondary | ICD-10-CM

## 2018-07-02 DIAGNOSIS — H353233 Exudative age-related macular degeneration, bilateral, with inactive scar: Secondary | ICD-10-CM | POA: Diagnosis not present

## 2018-07-04 ENCOUNTER — Telehealth (HOSPITAL_COMMUNITY): Payer: Self-pay

## 2018-07-04 NOTE — Telephone Encounter (Signed)
The above patient or their representative was contacted and gave the following answers to these questions:         Do you have any of the following symptoms?  Fever                    Cough                   Shortness of breath   NO  Do  you have any of the following other symptoms?    muscle pain         vomiting,        diarrhea        rash         weakness        red eye        abdominal pain         bruising          bruising or bleeding              joint pain           severe headache  NO  Have you been in contact with someone who was or has been sick in the past 2 weeks?  Yes                 Unsure                         Unable to assess    NO Does the person that you were in contact with have any of the following symptoms?   Cough         shortness of breath           muscle pain         vomiting,            diarrhea            rash            weakness           fever            red eye           abdominal pain           bruising  or  bleeding                joint pain                severe headache               Have you  or someone you have been in contact with traveled internationally in th last month?       NO  If yes, which countries?   Have you  or someone you have been in contact with traveled outside [place] in th last month?       NO  If yes, which state and city?   COMMENTS OR ACTION PLAN FOR THIS PATIENT:

## 2018-07-07 ENCOUNTER — Other Ambulatory Visit: Payer: Self-pay

## 2018-07-07 ENCOUNTER — Encounter: Payer: Self-pay | Admitting: Surgery

## 2018-07-07 ENCOUNTER — Other Ambulatory Visit: Payer: Self-pay | Admitting: *Deleted

## 2018-07-07 ENCOUNTER — Ambulatory Visit (INDEPENDENT_AMBULATORY_CARE_PROVIDER_SITE_OTHER): Payer: Medicare Other | Admitting: Surgery

## 2018-07-07 ENCOUNTER — Ambulatory Visit (HOSPITAL_COMMUNITY)
Admission: RE | Admit: 2018-07-07 | Discharge: 2018-07-07 | Disposition: A | Discharge status: Home / Self Care | Payer: Medicare Other | Source: Ambulatory Visit | Attending: Family | Admitting: Family

## 2018-07-07 VITALS — BP 143/62 | HR 74 | Temp 97.2°F | Resp 20 | Ht 69.0 in | Wt 141.8 lb

## 2018-07-07 DIAGNOSIS — N185 Chronic kidney disease, stage 5: Secondary | ICD-10-CM | POA: Diagnosis not present

## 2018-07-07 DIAGNOSIS — I2510 Atherosclerotic heart disease of native coronary artery without angina pectoris: Secondary | ICD-10-CM | POA: Diagnosis not present

## 2018-07-07 DIAGNOSIS — N184 Chronic kidney disease, stage 4 (severe): Secondary | ICD-10-CM

## 2018-07-07 NOTE — Progress Notes (Signed)
Vascular and Vein Specialist of [facility]  Patient name: Henry [name] MRN: [id number] DOB: May 13, 1924 Sex: male   REASON FOR VISIT:    Follow up  [name]:   Henry [name] is a 85 y.o. male who returns, status post left brachiocephalic fistula on [id number].  He was admitted 5 days later with a NSTEMI.  He underwent cardiac catheterization and medical management was recommended.  His creatinine returned to baseline prior to discharge.    At his last visit he was complaining of some coolness in his hand and I encouraged him to use a exercise ball which he has done.  That has slightly improved.   PAST MEDICAL HISTORY:   Past Medical History:  Diagnosis Date  . Anemia   . CAD of autologous bypass graft   . Carotid artery disease ([place])   . Diabetes mellitus without complication ([place])   . DJD (degenerative joint disease)   . History of anemia of chronic disease   . Hypercholesteremia   . Hypertension   . NSTEMI (non-ST elevated myocardial infarction) ([place])   . PVD (peripheral vascular disease) with claudication ([place])   . Renal insufficiency   . Wears dentures   . Wears glasses      FAMILY HISTORY:   Family History  Problem Relation Age of Onset  . Heart disease Mother   . Heart disease Father     SOCIAL HISTORY:   Social History   Tobacco Use  . Smoking status: Former Smoker    Packs/day: 1.00    Years: 10.00    Pack years: 10.00    Types: Cigarettes    Last attempt to quit: 02/26/1953    Years since quitting: 65.4  . Smokeless tobacco: Never Used  Substance Use Topics  . Alcohol use: Not Currently    Alcohol/week: 0.0 standard drinks     ALLERGIES:   No Known Allergies   CURRENT MEDICATIONS:   Current Outpatient Medications  Medication Sig Dispense Refill  . amLODipine (NORVASC) 5 MG tablet Take 1 tablet (5 mg total) by mouth every evening. 30 tablet 0  . aspirin EC 81 MG tablet Take 81 mg by  mouth daily.     Marland Kitchen atorvastatin (LIPITOR) 40 MG tablet Take 1 tablet (40 mg total) by mouth every evening. 30 tablet 0  . clopidogrel (PLAVIX) 75 MG tablet Take 1 tablet (75 mg total) by mouth daily. 30 tablet 0  . Darbepoetin Alfa (ARANESP) 100 MCG/0.5ML SOSY injection Inject 100 mcg into the skin.    . ferrous gluconate (FERGON) 325 MG tablet Take 325 mg by mouth daily.     . furosemide (LASIX) 40 MG tablet Take 40 mg by mouth daily.    Marland Kitchen glucose blood (ACCU-CHEK AVIVA PLUS) test strip CHECK BLOOD SUGAR TWICE A DAY. 50 each 5  . hydrALAZINE (APRESOLINE) 25 MG tablet Take 1 tablet (25 mg total) by mouth 2 (two) times daily. 60 tablet 0  . Lancets (ACCU-CHEK MULTICLIX) lancets USE TWICE DAILY. 102 each 5  . levothyroxine (SYNTHROID, LEVOTHROID) 50 MCG tablet TAKE ONE TABLET BY MOUTH ONCE DAILY BEFORE BREAKFAST 90 tablet 3  . Multiple Vitamins-Minerals (PRESERVISION AREDS 2 PO) Take 1 tablet by mouth daily.    . traMADol (ULTRAM) 50 MG tablet Take 1 tablet (50 mg total) by mouth every 6 (six) hours as needed. 20 tablet 0  . Vitamin D, Ergocalciferol, (DRISDOL) 1.25 MG (50000 UT) CAPS capsule Take 50,000 Units by mouth 2 (two)  times a week. Mondays & Thursdays     No current facility-administered medications for this visit.     REVIEW OF SYSTEMS:   [X]  denotes positive finding, [ ]  denotes negative finding Cardiac  Comments:  Chest pain or chest pressure:    Shortness of breath upon exertion:    Short of breath when lying flat:    Irregular heart rhythm:        Vascular    Pain in calf, thigh, or hip brought on by ambulation:    Pain in feet at night that wakes you up from your sleep:     Blood clot in your veins:    Leg swelling:         Pulmonary    Oxygen at home:    Productive cough:     Wheezing:         Neurologic    Sudden weakness in arms or legs:     Sudden numbness in arms or legs:     Sudden onset of difficulty speaking or slurred speech:    Temporary loss of vision  in one eye:     Problems with dizziness:         Gastrointestinal    Blood in stool:     Vomited blood:         Genitourinary    Burning when urinating:     Blood in urine:        Psychiatric    Major depression:         Hematologic    Bleeding problems:    Problems with blood clotting too easily:        Skin    Rashes or ulcers:        Constitutional    Fever or chills:      PHYSICAL EXAM:   Vitals:   07/07/18 1142  BP: (!) 143/62  Pulse: 74  Resp: 20  Temp: (!) 97.2 F (36.2 C)  SpO2: 98%  Weight: 64.3 kg  Height: 5\' 9"  (1.753 m)    GENERAL: The patient is a well-nourished male, in no acute distress. The vital signs are documented above. CARDIAC: There is a regular rate and rhythm.  VASCULAR: Tortuous pulsatile fistula in the left upper arm PULMONARY: Non-labored respirations MUSCULOSKELETAL: There are no major deformities or cyanosis. NEUROLOGIC: No focal weakness or paresthesias are detected. SKIN: There are no ulcers or rashes noted. PSYCHIATRIC: The patient has a normal affect.  STUDIES:   I have ordered and reviewed his duplex with the following findings OUTFLOW VEINPSV (cm/s)Diameter (cm)Depth (cm)       Describe        +------------+----------+-------------+----------+----------------------+ Shoulder       733        0.28        0.18           bruit          +------------+----------+-------------+----------+----------------------+ Prox UA        136        0.41        0.19                          +------------+----------+-------------+----------+----------------------+ Mid UA         326        0.43        0.22   significant tortuosity +------------+----------+-------------+----------+----------------------+ Dist UA        356  0.42        0.33                          +------------+----------+-------------+----------+----------------------+     MEDICAL ISSUES:   I am somewhat skeptical that I can get  this fistula to mature.  However after discussing with the patient we have decided to proceed with a fistulogram in an effort to see if this can be salvaged.  If not, the next step would be a basilic vein fistula creation.  Given that he had a MI following his first operation, he will need cardiology input before scheduling this.  I will need to cannulate his fistula higher up in the arm than normal because of the tortuosity in case there is a lesion amenable to intervention which is suggested to be near the shoulder by his ultrasound.    Leia Alf, MD, FACS Vascular and Vein Specialists of [facility] [phone] Pager [phone]

## 2018-07-07 NOTE — H&P (View-Only) (Signed)
Vascular and Vein Specialist of [facility]  Patient name: Henry [name] MRN: [id number] DOB: 10-Oct-1924 Sex: male   REASON FOR VISIT:    Follow up  [name]:   Henry [name] is a 85 y.o. male who returns, status post left brachiocephalic fistula on [id number].  He was admitted 5 days later with a NSTEMI.  He underwent cardiac catheterization and medical management was recommended.  His creatinine returned to baseline prior to discharge.    At his last visit he was complaining of some coolness in his hand and I encouraged him to use a exercise ball which he has done.  That has slightly improved.   PAST MEDICAL HISTORY:   Past Medical History:  Diagnosis Date  . Anemia   . CAD of autologous bypass graft   . Carotid artery disease ([place])   . Diabetes mellitus without complication ([place])   . DJD (degenerative joint disease)   . History of anemia of chronic disease   . Hypercholesteremia   . Hypertension   . NSTEMI (non-ST elevated myocardial infarction) ([place])   . PVD (peripheral vascular disease) with claudication ([place])   . Renal insufficiency   . Wears dentures   . Wears glasses      FAMILY HISTORY:   Family History  Problem Relation Age of Onset  . Heart disease Mother   . Heart disease Father     SOCIAL HISTORY:   Social History   Tobacco Use  . Smoking status: Former Smoker    Packs/day: 1.00    Years: 10.00    Pack years: 10.00    Types: Cigarettes    Last attempt to quit: 02/26/1953    Years since quitting: 65.4  . Smokeless tobacco: Never Used  Substance Use Topics  . Alcohol use: Not Currently    Alcohol/week: 0.0 standard drinks     ALLERGIES:   No Known Allergies   CURRENT MEDICATIONS:   Current Outpatient Medications  Medication Sig Dispense Refill  . amLODipine (NORVASC) 5 MG tablet Take 1 tablet (5 mg total) by mouth every evening. 30 tablet 0  . aspirin EC 81 MG tablet Take 81 mg by  mouth daily.     Marland Kitchen atorvastatin (LIPITOR) 40 MG tablet Take 1 tablet (40 mg total) by mouth every evening. 30 tablet 0  . clopidogrel (PLAVIX) 75 MG tablet Take 1 tablet (75 mg total) by mouth daily. 30 tablet 0  . Darbepoetin Alfa (ARANESP) 100 MCG/0.5ML SOSY injection Inject 100 mcg into the skin.    . ferrous gluconate (FERGON) 325 MG tablet Take 325 mg by mouth daily.     . furosemide (LASIX) 40 MG tablet Take 40 mg by mouth daily.    Marland Kitchen glucose blood (ACCU-CHEK AVIVA PLUS) test strip CHECK BLOOD SUGAR TWICE A DAY. 50 each 5  . hydrALAZINE (APRESOLINE) 25 MG tablet Take 1 tablet (25 mg total) by mouth 2 (two) times daily. 60 tablet 0  . Lancets (ACCU-CHEK MULTICLIX) lancets USE TWICE DAILY. 102 each 5  . levothyroxine (SYNTHROID, LEVOTHROID) 50 MCG tablet TAKE ONE TABLET BY MOUTH ONCE DAILY BEFORE BREAKFAST 90 tablet 3  . Multiple Vitamins-Minerals (PRESERVISION AREDS 2 PO) Take 1 tablet by mouth daily.    . traMADol (ULTRAM) 50 MG tablet Take 1 tablet (50 mg total) by mouth every 6 (six) hours as needed. 20 tablet 0  . Vitamin D, Ergocalciferol, (DRISDOL) 1.25 MG (50000 UT) CAPS capsule Take 50,000 Units by mouth 2 (two)  times a week. Mondays & Thursdays     No current facility-administered medications for this visit.     REVIEW OF SYSTEMS:   [X]  denotes positive finding, [ ]  denotes negative finding Cardiac  Comments:  Chest pain or chest pressure:    Shortness of breath upon exertion:    Short of breath when lying flat:    Irregular heart rhythm:        Vascular    Pain in calf, thigh, or hip brought on by ambulation:    Pain in feet at night that wakes you up from your sleep:     Blood clot in your veins:    Leg swelling:         Pulmonary    Oxygen at home:    Productive cough:     Wheezing:         Neurologic    Sudden weakness in arms or legs:     Sudden numbness in arms or legs:     Sudden onset of difficulty speaking or slurred speech:    Temporary loss of vision  in one eye:     Problems with dizziness:         Gastrointestinal    Blood in stool:     Vomited blood:         Genitourinary    Burning when urinating:     Blood in urine:        Psychiatric    Major depression:         Hematologic    Bleeding problems:    Problems with blood clotting too easily:        Skin    Rashes or ulcers:        Constitutional    Fever or chills:      PHYSICAL EXAM:   Vitals:   07/07/18 1142  BP: (!) 143/62  Pulse: 74  Resp: 20  Temp: (!) 97.2 F (36.2 C)  SpO2: 98%  Weight: 64.3 kg  Height: 5\' 9"  (1.753 m)    GENERAL: The patient is a well-nourished male, in no acute distress. The vital signs are documented above. CARDIAC: There is a regular rate and rhythm.  VASCULAR: Tortuous pulsatile fistula in the left upper arm PULMONARY: Non-labored respirations MUSCULOSKELETAL: There are no major deformities or cyanosis. NEUROLOGIC: No focal weakness or paresthesias are detected. SKIN: There are no ulcers or rashes noted. PSYCHIATRIC: The patient has a normal affect.  STUDIES:   I have ordered and reviewed his duplex with the following findings OUTFLOW VEINPSV (cm/s)Diameter (cm)Depth (cm)       Describe        +------------+----------+-------------+----------+----------------------+ Shoulder       733        0.28        0.18           bruit          +------------+----------+-------------+----------+----------------------+ Prox UA        136        0.41        0.19                          +------------+----------+-------------+----------+----------------------+ Mid UA         326        0.43        0.22   significant tortuosity +------------+----------+-------------+----------+----------------------+ Dist UA        356  0.42        0.33                          +------------+----------+-------------+----------+----------------------+     MEDICAL ISSUES:   I am somewhat skeptical that I can get  this fistula to mature.  However after discussing with the patient we have decided to proceed with a fistulogram in an effort to see if this can be salvaged.  If not, the next step would be a basilic vein fistula creation.  Given that he had a MI following his first operation, he will need cardiology input before scheduling this.  I will need to cannulate his fistula higher up in the arm than normal because of the tortuosity in case there is a lesion amenable to intervention which is suggested to be near the shoulder by his ultrasound.    Leia Alf, MD, FACS Vascular and Vein Specialists of [facility] [phone] Pager [phone]

## 2018-07-07 NOTE — Progress Notes (Signed)
Instructed to be at [facility] admitting at 7:30 am on 07/15/2018 for fistulogram. Must have a driver and caregiver for discharge. NPO past MN night prior. Take the following medications with sips of water on 07/15/2018 before leaving for the hospital: Amlodipine, Levothyroxine, Hydralazine. Hold all others until after procedure. Reviewed visitor restrictions. Verbalized understanding and to cll office if any questions.

## 2018-07-08 DIAGNOSIS — M5431 Sciatica, right side: Secondary | ICD-10-CM | POA: Diagnosis not present

## 2018-07-08 DIAGNOSIS — M25551 Pain in right hip: Secondary | ICD-10-CM | POA: Diagnosis not present

## 2018-07-09 ENCOUNTER — Other Ambulatory Visit: Payer: Medicare Other | Admitting: *Deleted

## 2018-07-09 ENCOUNTER — Other Ambulatory Visit: Payer: Self-pay

## 2018-07-09 DIAGNOSIS — E7800 Pure hypercholesterolemia, unspecified: Secondary | ICD-10-CM

## 2018-07-10 LAB — LIPID PANEL
Chol/HDL Ratio: 2.6 ratio (ref 0.0–5.0)
Cholesterol, Total: 122 mg/dL (ref 100–199)
HDL: 47 mg/dL (ref >39)
LDL Calculated: 58 mg/dL (ref 0–99)
Triglycerides: 86 mg/dL (ref 0–149)
VLDL Cholesterol Cal: 17 mg/dL (ref 5–40)

## 2018-07-10 LAB — HEPATIC FUNCTION PANEL
ALT: 18 IU/L (ref 0–44)
AST: 19 IU/L (ref 0–40)
Albumin: 4.2 g/dL (ref 3.5–4.6)
Alkaline Phosphatase: 86 IU/L (ref 39–117)
Bilirubin Total: 0.4 mg/dL (ref 0.0–1.2)
Bilirubin, Direct: 0.16 mg/dL (ref 0.00–0.40)
Total Protein: 7 g/dL (ref 6.0–8.5)

## 2018-07-11 ENCOUNTER — Telehealth: Payer: Self-pay

## 2018-07-11 ENCOUNTER — Other Ambulatory Visit (HOSPITAL_COMMUNITY): Payer: Medicare Other

## 2018-07-11 NOTE — Telephone Encounter (Signed)
Notes recorded by Frederik Schmidt, RN on 07/11/2018 at 8:53 AM EDT The patient has been notified of the result and verbalized understanding. All questions (if any) were answered. Frederik Schmidt, RN 07/11/2018 8:53 AM

## 2018-07-11 NOTE — Telephone Encounter (Signed)
-----   Message from Liliane Shi, [place] sent at 07/10/2018  5:39 PM EDT ----- Lipids optimal.  LFTs normal.  Recommendations:  - Continue current medications and follow up as planned.  [name], PA-C    07/10/2018 5:34 PM

## 2018-07-14 ENCOUNTER — Other Ambulatory Visit (HOSPITAL_COMMUNITY): Payer: Medicare Other

## 2018-07-14 ENCOUNTER — Other Ambulatory Visit (HOSPITAL_COMMUNITY)
Admission: RE | Admit: 2018-07-14 | Discharge: 2018-07-14 | Disposition: A | Discharge status: Home / Self Care | Payer: Medicare Other | Source: Ambulatory Visit | Attending: Surgery | Admitting: Surgery

## 2018-07-14 DIAGNOSIS — Z1159 Encounter for screening for other viral diseases: Secondary | ICD-10-CM | POA: Diagnosis not present

## 2018-07-14 DIAGNOSIS — Z01812 Encounter for preprocedural laboratory examination: Secondary | ICD-10-CM | POA: Diagnosis not present

## 2018-07-14 LAB — SARS CORONAVIRUS 2 BY RT PCR (HOSPITAL ORDER, PERFORMED IN ~~LOC~~ HOSPITAL LAB): SARS Coronavirus 2: NEGATIVE

## 2018-07-15 ENCOUNTER — Other Ambulatory Visit: Payer: Self-pay

## 2018-07-15 ENCOUNTER — Ambulatory Visit (HOSPITAL_COMMUNITY)
Admission: RE | Admit: 2018-07-15 | Discharge: 2018-07-15 | Disposition: A | Discharge status: Home / Self Care | Payer: Medicare Other | Attending: Surgery | Admitting: Surgery

## 2018-07-15 ENCOUNTER — Ambulatory Visit (HOSPITAL_COMMUNITY)
Admission: RE | Disposition: A | Discharge status: Home / Self Care | Payer: Self-pay | Source: Home / Self Care | Attending: Surgery

## 2018-07-15 ENCOUNTER — Encounter (HOSPITAL_COMMUNITY): Payer: Self-pay | Admitting: Surgery

## 2018-07-15 ENCOUNTER — Telehealth: Payer: Self-pay | Admitting: *Deleted

## 2018-07-15 DIAGNOSIS — T82510A Breakdown (mechanical) of surgically created arteriovenous fistula, initial encounter: Secondary | ICD-10-CM | POA: Diagnosis not present

## 2018-07-15 DIAGNOSIS — E1122 Type 2 diabetes mellitus with diabetic chronic kidney disease: Secondary | ICD-10-CM | POA: Insufficient documentation

## 2018-07-15 DIAGNOSIS — I252 Old myocardial infarction: Secondary | ICD-10-CM | POA: Diagnosis not present

## 2018-07-15 DIAGNOSIS — E7800 Pure hypercholesterolemia, unspecified: Secondary | ICD-10-CM | POA: Diagnosis not present

## 2018-07-15 DIAGNOSIS — Y839 Surgical procedure, unspecified as the cause of abnormal reaction of the patient, or of later complication, without mention of misadventure at the time of the procedure: Secondary | ICD-10-CM | POA: Insufficient documentation

## 2018-07-15 DIAGNOSIS — Z79899 Other long term (current) drug therapy: Secondary | ICD-10-CM | POA: Diagnosis not present

## 2018-07-15 DIAGNOSIS — Z8249 Family history of ischemic heart disease and other diseases of the circulatory system: Secondary | ICD-10-CM | POA: Diagnosis not present

## 2018-07-15 DIAGNOSIS — Z87891 Personal history of nicotine dependence: Secondary | ICD-10-CM | POA: Diagnosis not present

## 2018-07-15 DIAGNOSIS — I129 Hypertensive chronic kidney disease with stage 1 through stage 4 chronic kidney disease, or unspecified chronic kidney disease: Secondary | ICD-10-CM | POA: Diagnosis not present

## 2018-07-15 DIAGNOSIS — N184 Chronic kidney disease, stage 4 (severe): Secondary | ICD-10-CM | POA: Insufficient documentation

## 2018-07-15 DIAGNOSIS — I2510 Atherosclerotic heart disease of native coronary artery without angina pectoris: Secondary | ICD-10-CM | POA: Insufficient documentation

## 2018-07-15 DIAGNOSIS — E1151 Type 2 diabetes mellitus with diabetic peripheral angiopathy without gangrene: Secondary | ICD-10-CM | POA: Diagnosis not present

## 2018-07-15 DIAGNOSIS — Z7982 Long term (current) use of aspirin: Secondary | ICD-10-CM | POA: Diagnosis not present

## 2018-07-15 HISTORY — PX: A/V FISTULAGRAM: CATH118298

## 2018-07-15 HISTORY — PX: UPPER EXTREMITY VENOGRAPHY: CATH118272

## 2018-07-15 LAB — POCT I-STAT 4, (NA,K, GLUC, HGB,HCT)
Glucose, Bld: 93 mg/dL (ref 70–99)
HCT: 28 % — ABNORMAL LOW (ref 39.0–52.0)
Hemoglobin: 9.5 g/dL — ABNORMAL LOW (ref 13.0–17.0)
Potassium: 3.5 mmol/L (ref 3.5–5.1)
Sodium: 138 mmol/L (ref 135–145)

## 2018-07-15 LAB — GLUCOSE, CAPILLARY: Glucose-Capillary: 82 mg/dL (ref 70–99)

## 2018-07-15 SURGERY — A/V FISTULAGRAM
Anesthesia: LOCAL

## 2018-07-15 MED ORDER — MIDAZOLAM HCL 2 MG/2ML IJ SOLN
INTRAMUSCULAR | Status: AC
  Filled 2018-07-15: qty 2

## 2018-07-15 MED ORDER — FENTANYL CITRATE (PF) 100 MCG/2ML IJ SOLN
INTRAMUSCULAR | Status: AC
  Filled 2018-07-15: qty 2

## 2018-07-15 MED ORDER — LIDOCAINE HCL (PF) 1 % IJ SOLN
INTRAMUSCULAR | Status: AC
  Filled 2018-07-15: qty 30

## 2018-07-15 MED ORDER — HEPARIN (PORCINE) IN NACL 1000-0.9 UT/500ML-% IV SOLN
INTRAVENOUS | Status: AC
  Filled 2018-07-15: qty 500

## 2018-07-15 MED ORDER — IODIXANOL 320 MG/ML IV SOLN
INTRAVENOUS | Status: DC | PRN
  Administered 2018-07-15: 11:00:00 9 mL via INTRAVENOUS

## 2018-07-15 MED ORDER — FENTANYL CITRATE (PF) 100 MCG/2ML IJ SOLN
INTRAMUSCULAR | Status: DC | PRN
  Administered 2018-07-15: 25 ug via INTRAVENOUS

## 2018-07-15 MED ORDER — SODIUM CHLORIDE 0.9 % IV SOLN
INTRAVENOUS | Status: DC
  Administered 2018-07-15: 08:00:00 via INTRAVENOUS

## 2018-07-15 MED ORDER — MIDAZOLAM HCL 2 MG/2ML IJ SOLN
INTRAMUSCULAR | Status: DC | PRN
  Administered 2018-07-15: 0.5 mg via INTRAVENOUS

## 2018-07-15 MED ORDER — HEPARIN (PORCINE) IN NACL 1000-0.9 UT/500ML-% IV SOLN
INTRAVENOUS | Status: DC | PRN
  Administered 2018-07-15: 500 mL

## 2018-07-15 MED ORDER — LIDOCAINE HCL (PF) 1 % IJ SOLN
INTRAMUSCULAR | Status: DC | PRN
  Administered 2018-07-15: 3 mL

## 2018-07-15 SURGICAL SUPPLY — 10 items
BAG SNAP BAND KOVER 36X36 (MISCELLANEOUS) ×3 IMPLANT
COVER DOME SNAP 22 D (MISCELLANEOUS) ×3 IMPLANT
KIT MICROPUNCTURE NIT STIFF (SHEATH) ×1 IMPLANT
PROTECTION STATION PRESSURIZED (MISCELLANEOUS) ×3
SHEATH PROBE COVER 6X72 (BAG) ×4 IMPLANT
STATION PROTECTION PRESSURIZED (MISCELLANEOUS) ×2 IMPLANT
STOPCOCK MORSE 400PSI 3WAY (MISCELLANEOUS) ×3 IMPLANT
TRAY PV CATH (CUSTOM PROCEDURE TRAY) ×3 IMPLANT
TUBING CIL FLEX 10 FLL-RA (TUBING) ×3 IMPLANT
WIRE TORQFLEX AUST .018X40CM (WIRE) ×2 IMPLANT

## 2018-07-15 NOTE — Progress Notes (Signed)
Pt daughter called and informed he is going to procedure

## 2018-07-15 NOTE — Progress Notes (Addendum)
Discharge instructions reviewed via daughter voices understanding. Over the telephone

## 2018-07-15 NOTE — Op Note (Signed)
    Patient name: Henry [name] MRN: [id number] DOB: 01-03-1925 Sex: male  07/15/2018 Pre-operative Diagnosis: CKD4 Post-operative diagnosis:  Same Surgeon:  Annamarie Major Procedure Performed:  1.  Ultrasound-guided access, left cephalic vein  2.  Fistulogram  3.  Ultrasound-guided access, left brachial vein  4.  Venogram  5.  Conscious sedation (41 minutes)     Indications: The patient underwent left brachiocephalic fistula creation which has not matured.  He is in for further evaluation  Procedure:  The patient was identified in the holding area and taken to room 8.  The patient was then placed supine on the table and prepped and draped in the usual sterile fashion.  A time out was called.  Conscious sedation was administered with the use of IV fentanyl and Versed under continuous physician and nurse monitoring.  Heart rate, blood pressure, and oxygen saturations were continuously monitored.  Total sedation time was 41 minutes ultrasound was used to evaluate the fistula.  The vein was patent and compressible.  A digital ultrasound image was acquired.  The fistula was then accessed under ultrasound guidance using a micropuncture needle.  An 018 wire was then asvanced without resistance and a micropuncture sheath was placed.  Contrast injections were then performed through the sheath.  Next, ultrasound was used to evaluate the left brachial vein which was widely patent.  It was cannulated under ultrasound guidance.  An 018 wire was inserted followed by the dilator for the micropuncture sheath.  Contrast injection for the venogram was performed through the micropuncture sheath.  Findings: The fistula is tortuous.  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  #2  The patient will need a ligation of his brachiocephalic fistula and new access on the right side, likely for  stage basilic vein fistula, on Plavix with his recent history of MI  V. Annamarie Major, M.D., [facility] ([place]) [facility] Vein Specialists of [place] Office: [phone] Pager:  [phone]

## 2018-07-15 NOTE — Telephone Encounter (Signed)
Fine to hold plavix 5 days before surgery if needed. He is at moderate risk of periprocedural ischemia/infarct but no way to mitigate this risk. Continue current Rx.

## 2018-07-15 NOTE — Telephone Encounter (Signed)
Dr. Burt Knack Please comment on holding plavix for 3 days. Last cath without intervention, 2/4 patent grafts.

## 2018-07-15 NOTE — Interval H&P Note (Signed)
History and Physical Interval Note:  07/15/2018 8:46 AM  Henry [name]  has presented today for surgery, with the diagnosis of complication with fistula.  The various methods of treatment have been discussed with the patient and family. After consideration of risks, benefits and other options for treatment, the patient has consented to  Procedure(s): A/V FISTULAGRAM - Left Arm (N/A) as a surgical intervention.  The patient's history has been reviewed, patient examined, no change in status, stable for surgery.  I have reviewed the patient's chart and labs.  Questions were answered to the patient's satisfaction.     Annamarie Major

## 2018-07-15 NOTE — Telephone Encounter (Signed)
Request for Surgical Clearance  1. What type of surgery is being performed?  Ligation arteriovenous fistula VS insertion of graft    2. When is this surgery scheduled? Pending clearance  3. What type of clearance is required (medical clearance vs. Pharmacy clearance to hold med vs. Both)? BOTH Cardiac Clearance and Pharmacy    4. Are there any medications that need to be held prior to surgery and how long?  Recommendation no hold on PLAVIX or can PLAVIX be held for 3 days pre-op ?    5. Practice name and name of physician performing surgery?   [facility] Dr. Trula Slade    6.  What is your office phone number?  [phone]    7. What is your office fax number? (Be sure to include anyone who it needs to go Attn to) [phone] attn. [email or web address]    8. Anesthesia type (None, local, MAC, general)? MAC   REMINDER TO USER: Remember to please route this message to P CV DIV PREOP in a phone note.

## 2018-07-15 NOTE — Discharge Instructions (Signed)

## 2018-07-15 NOTE — Progress Notes (Signed)
Discharge instructions reviewed with pt message left with his daughter to call me back so we can go over discharge instructions

## 2018-07-16 ENCOUNTER — Telehealth: Payer: Self-pay | Admitting: *Deleted

## 2018-07-16 ENCOUNTER — Telehealth: Payer: Self-pay

## 2018-07-16 MED FILL — Lidocaine HCl Local Preservative Free (PF) Inj 1%: INTRAMUSCULAR | Qty: 30 | Status: AC

## 2018-07-16 NOTE — Telephone Encounter (Signed)
Pt is scheduled for a virtual visit with Dr. Burt Knack on 07/23/2018

## 2018-07-16 NOTE — Telephone Encounter (Signed)
I have spoke with Dr. York Cerise nurse Valetta Fuller and she states that Dr. Burt Knack will have openings next week and she will add him to his schedule once she gets the template added.

## 2018-07-16 NOTE — Telephone Encounter (Signed)
Per phone call. Patient desires to speak with his Cardiologist prior to scheduling surgery for HD access. Stressed to patient that he is to call this office when ready to schedule. Verbalized understanding.

## 2018-07-16 NOTE — Telephone Encounter (Signed)
Per patient request, schedued him for e-vist with Dr. Burt Knack next Wednesday. His daughter will be present for video chat. He understands to have vitals and meds ready for precall.      Virtual Visit Pre-Appointment Phone Call  1. Confirm consent - "In the setting of the current Covid19 crisis, you are scheduled for a (phone or video) visit with your provider on (date) at (time).  Just as we do with many in-office visits, in order for you to participate in this visit, we must obtain consent.  If you'd like, I can send this to your mychart (if signed up) or email for you to review.  Otherwise, I can obtain your verbal consent now.  All virtual visits are billed to your insurance company just like a normal visit would be.  By agreeing to a virtual visit, we'd like you to understand that the technology does not allow for your provider to perform an examination, and thus may limit your provider's ability to fully assess your condition. If your provider identifies any concerns that need to be evaluated in person, we will make arrangements to do so.  Finally, though the technology is pretty good, we cannot assure that it will always work on either your or our end, and in the setting of a video visit, we may have to convert it to a phone-only visit.  In either situation, we cannot ensure that we have a secure connection.  Are you willing to proceed?" STAFF: Did the patient verbally acknowledge consent to telehealth visit? Document YES/NO here: YES  TELEPHONE CALL NOTE  Henry [name] has been deemed a candidate for a follow-up tele-health visit to limit community exposure during the Covid-19 pandemic. I spoke with the patient via phone to ensure availability of phone/video source, confirm preferred email & phone number, and discuss instructions and expectations.  I reminded Henry [name] to be prepared with any vital sign and/or heart rhythm information that could potentially be obtained via home  monitoring, at the time of his visit. I reminded Henry [name] to expect a phone call prior to his visit.   IF USING DOXIMITY or [email or web address] - The patient will receive a link just prior to their visit by text.

## 2018-07-16 NOTE — Telephone Encounter (Addendum)
   Primary Cardiologist: Sherren Mocha, MD  Chart reviewed as part of pre-operative protocol coverage. Patient was contacted 07/16/2018 in reference to pre-operative risk assessment for pending surgery as outlined below.  Henry [name] was last seen on 04/11/18 by [name] PAC. Since that day, [name] has done well. Last heart cath 03/27/18 with 2/4 patent grafts, no intervention. He denies any new or worsening cardiac symptoms. His mobility is limited by legs tightening, which is not new, but he can complete 4.0 METS. He appears to be stable from a CAD perspective. However, the patient does not want to proceed with surgery until he has a virtual visit with Dr. Burt Knack. I do not think this is unreasonable.  Please schedule a virtual visit with Dr. Burt Knack in the next 2 weeks, if possible.   I will route this recommendation to the requesting party via Epic fax function and remove from pre-op pool.  Please call with questions.  Henry Lin Shamila Lerch, PA 07/16/2018, 8:22 AM

## 2018-07-23 ENCOUNTER — Encounter: Payer: Self-pay | Admitting: Cardiovascular Disease

## 2018-07-23 ENCOUNTER — Other Ambulatory Visit: Payer: Self-pay

## 2018-07-23 ENCOUNTER — Telehealth (INDEPENDENT_AMBULATORY_CARE_PROVIDER_SITE_OTHER): Payer: Medicare Other | Admitting: Cardiovascular Disease

## 2018-07-23 VITALS — BP 142/50 | HR 71 | Ht 69.0 in | Wt 140.0 lb

## 2018-07-23 DIAGNOSIS — I350 Nonrheumatic aortic (valve) stenosis: Secondary | ICD-10-CM

## 2018-07-23 DIAGNOSIS — I2510 Atherosclerotic heart disease of native coronary artery without angina pectoris: Secondary | ICD-10-CM

## 2018-07-23 NOTE — Progress Notes (Signed)
Virtual Visit via Video Note   This visit type was conducted due to national recommendations for restrictions regarding the COVID-19 Pandemic (e.g. social distancing) in an effort to limit this patient's exposure and mitigate transmission in our community.  Due to his co-morbid illnesses, this patient is at least at moderate risk for complications without adequate follow up.  This format is felt to be most appropriate for this patient at this time.  All issues noted in this document were discussed and addressed.  A limited physical exam was performed with this format.  Please refer to the patient's chart for his consent to telehealth for [facility].   Date:  07/23/2018   ID:  Henry [name], DOB 1924-09-15, MRN [id number]  Patient Location: Home Provider Location: Home  PCP:  [name], MD  Cardiologist:  Sherren Mocha, MD  Electrophysiologist:  None   Evaluation Performed:  Follow-Up Visit  Chief Complaint:  Follow-up CAD  History of Present Illness:    Henry [name] is a 85 y.o. male with coronary artery disease status post remote CABG in 2000, moderate aortic stenosis, peripheral arterial disease, and advanced chronic kidney disease.  Other comorbid conditions include diabetes, hypertension, and hyperlipidemia.  The patient underwent AV fistula surgery in January 2020 in anticipation of requiring dialysis in the future.  Postoperatively he developed acute diastolic heart failure and non-STEMI following hospital discharge.  He ultimately underwent cardiac catheterization demonstrating continued patency of the LIMA to LAD graft and the saphenous vein graft to diagonal branch.  The saphenous vein graft to the RCA and first obtuse marginal were chronically occluded and there were collaterals to the distal RCA circulation.  Ongoing medical therapy was recommended.  The patient's LVEF at that time was in the range of 50 to 55%.  The patient is evaluated today via video monitoring  technology in light of the current COVID-19 pandemic.  His daughter is present for the evaluation.  He is contemplating whether to go forward with another AV fistula surgery.  He is somewhat reluctant to do that considering the cardiac events around his previous surgery.  He has been feeling fairly well.  He denies any recent episodes of chest pain or chest pressure.  He denies shortness of breath, orthopnea, PND, or any changes in his mild leg swelling.  The patient does not have symptoms concerning for COVID-19 infection (fever, chills, cough, or new shortness of breath).    Past Medical History:  Diagnosis Date   Anemia    CAD of autologous bypass graft    Carotid artery disease (HCC)    Diabetes mellitus without complication (HCC)    DJD (degenerative joint disease)    History of anemia of chronic disease    Hypercholesteremia    Hypertension    NSTEMI (non-ST elevated myocardial infarction) ([place])    PVD (peripheral vascular disease) with claudication ([place])    Renal insufficiency    Wears dentures    Wears glasses    Past Surgical History:  Procedure Laterality Date   A/V FISTULAGRAM N/A 07/15/2018   Procedure: A/V FISTULAGRAM - Left Arm;  Surgeon: Serafina Mitchell, MD;  Location: [place] [facility];  Service: Cardiovascular;  Laterality: N/A;   AV FISTULA PLACEMENT Left 03/20/2018   Procedure: ARTERIOVENOUS (AV) FISTULA CREATION LEFT ARM;  Surgeon: Serafina Mitchell, MD;  Location: [facility];  Service: Vascular;  Laterality: Left;   cataract surgery     COLONOSCOPY     Hx: of  CORONARY ARTERY BYPASS GRAFT  2000   by Dr. Cyndia Bent   LEFT HEART CATH AND CORS/GRAFTS ANGIOGRAPHY N/A 03/27/2018   Procedure: LEFT HEART CATH AND CORS/GRAFTS ANGIOGRAPHY;  Surgeon: Sherren Mocha, MD;  Location: [place] [facility];  Service: Cardiovascular;  Laterality: N/A;   LUMBAR LAMINECTOMY/DECOMPRESSION MICRODISCECTOMY N/A 09/10/2012   Procedure: LUMBAR DECOMPRESSION,  IN SITU FUSION  LUMBAR 4-5;  Surgeon: Melina Schools, MD;  Location: [place];  Service: Orthopedics;  Laterality: N/A;   MULTIPLE TOOTH EXTRACTIONS     TONSILLECTOMY     UPPER EXTREMITY VENOGRAPHY Left 07/15/2018   Procedure: UPPER EXTREMITY VENOGRAPHY;  Surgeon: Serafina Mitchell, MD;  Location: [place] [facility];  Service: Cardiovascular;  Laterality: Left;  UPPER ARM     Current Meds  Medication Sig   amLODipine (NORVASC) 10 MG tablet Take 10 mg by mouth every evening.   aspirin EC 81 MG tablet Take 81 mg by mouth daily.    atorvastatin (LIPITOR) 40 MG tablet Take 1 tablet (40 mg total) by mouth every evening.   b complex-vitamin c-folic acid (NEPHRO-VITE) 0.8 MG TABS tablet Take 1 tablet by mouth at bedtime.   calcitRIOL (ROCALTROL) 0.25 MCG capsule Take 0.25 mcg by mouth 2 (two) times a week.   calcium acetate (PHOSLO) 667 MG capsule Take 667 mg by mouth 2 (two) times daily with a meal.   clopidogrel (PLAVIX) 75 MG tablet Take 1 tablet (75 mg total) by mouth daily.   Darbepoetin Alfa (ARANESP) 100 MCG/0.5ML SOSY injection Inject 100 mcg into the skin every 30 (thirty) days.    ferrous gluconate (FERGON) 325 MG tablet Take 325 mg by mouth daily.    furosemide (LASIX) 40 MG tablet Take 40 mg by mouth daily.   glucose blood (ACCU-CHEK AVIVA PLUS) test strip CHECK BLOOD SUGAR TWICE A DAY.   hydrALAZINE (APRESOLINE) 25 MG tablet Take 1 tablet (25 mg total) by mouth 2 (two) times daily.   Lancets (ACCU-CHEK MULTICLIX) lancets USE TWICE DAILY.   levothyroxine (SYNTHROID, LEVOTHROID) 50 MCG tablet TAKE ONE TABLET BY MOUTH ONCE DAILY BEFORE BREAKFAST (Patient taking differently: Take 50 mcg by mouth daily before breakfast. )   Multiple Vitamins-Minerals (PRESERVISION AREDS 2 PO) Take 1 tablet by mouth daily.   traMADol (ULTRAM) 50 MG tablet Take 1 tablet (50 mg total) by mouth every 6 (six) hours as needed. (Patient taking differently: Take 50 mg by mouth every 6 (six) hours as needed for moderate  pain. )     Allergies:   Patient has no known allergies.   Social History   Tobacco Use   Smoking status: Former Smoker    Packs/day: 1.00    Years: 10.00    Pack years: 10.00    Types: Cigarettes    Last attempt to quit: 02/26/1953    Years since quitting: 65.4   Smokeless tobacco: Never Used  Substance Use Topics   Alcohol use: Not Currently    Alcohol/week: 0.0 standard drinks   Drug use: No     Family Hx: The patient's family history includes Heart disease in his father and mother.  ROS:   Please see the history of present illness.    Positive for weakness in his legs, chronic cough but no recent change.  All other systems reviewed and are negative.   Prior CV studies:   The following studies were reviewed today:  Cardiac Catheterization 03/27/2018: Conclusion     Ost RCA to Prox RCA lesion is 100% stenosed.  Origin to Prox Graft lesion is 100% stenosed.  Origin lesion is 100% stenosed.  Prox LAD lesion is 100% stenosed.  Ost 1st Mrg lesion is 90% stenosed.   1. Severe native 3 vessel disease with total occlusion of the RCA and LAD, severe stenosis of the left circumflex 2. S/P remote CABG with continued patency of the LIMA-LAD and SVG-diagonal, total occlusion of the SVG-RCA and SVG-PDA  Recommend medical management of CAD. No clear culprit lesion. Both SVG occlusions appear chronic. No targets for PCI.   Coronary Findings   Diagnostic  Dominance: Right  Left Anterior Descending  Prox LAD lesion 100% stenosed  Prox LAD lesion is 100% stenosed. The lesion is calcified.  Left Circumflex  First Obtuse Marginal Branch  Ost 1st Mrg lesion 90% stenosed  Ost 1st Mrg lesion is 90% stenosed.  Right Coronary Artery  Ost RCA to Prox RCA lesion 100% stenosed  Ost RCA to Prox RCA lesion is 100% stenosed. The lesion is severely calcified.  Right Posterior Descending Artery  Collaterals  Ost RPDA filled by collaterals from 1st Sept.    First Right  Posterolateral  Collaterals  1st RPLB filled by collaterals from 2nd Sept.    Graft to RPDA  Origin lesion 100% stenosed  Origin lesion is 100% stenosed.  Graft to Ost 1st Mrg  Origin to Prox Graft lesion 100% stenosed  Origin to Prox Graft lesion is 100% stenosed.  Graft to Ost 1st Diag  LIMA Graft to Mid LAD  Intervention   No interventions have been documented.  Coronary Diagrams   Diagnostic  Dominance: Right    Echo 03/26/2018: IMPRESSIONS    1. The left ventricle appears to be normal in size, has normal wall thickness 50-55% ejection fraction Spectral Doppler shows pseudonormal pattern of diastolic filling.  2. Probable basal inferolateral and lateral hypokinesis.  3. Right ventricular systolic pressure is is normal.  4. The right ventricle has normal size and normal systolic function.  5. Normal left atrial size.  6. Normal right atrial size.  7. Mitral valve regurgitation is mild to moderate by color flow Doppler.  8. Normal tricuspid valve.  9. Aortic valve regurgitation is mild by color flow Doppler. 10. Moderate stenosis of the aortic valve. 11. There is little change in the aortic stenosis severity, but there appears to be a new limited area of hypokinesis in the left circumflex artery distribution. 12. No atrial level shunt detected by color flow Doppler. 13. Aortic valve tricuspid. 14. The mitral valve is degenerative.  FINDINGS  Left Ventricle: The left ventricle appears to be normal in size, has normal wall thickness low normal systolic function with a 96-78% ejection fraction Spectral Doppler shows pseudonormal pattern of diastolic filling. There is probable mild hypokinesis  of the basilar lateral and inferolateral left ventricular segments. Right Ventricle: The right ventricle is normal in size normal wall thickness has normal systolic function. Right ventricular systolic pressure is is normal with an estimated pressure of 29.6 mmHg. Left Atrium: The  left atrium is normal in size. Right Atrium: The right atrial size is normal in size. Interatrial Septum: No atrial level shunt detected by color flow Doppler.  Pericardium: There is no evidence of pericardial effusion. Mitral Valve: The mitral valve is degenerative in appearance. There is mild thickening of the mitral valve. No evidence of mitral valve stenosis. Mitral valve regurgitation is mild to moderate by color flow Doppler. The MR jet is centrally-directed.  Diastolic mitral regurgitation is present. Tricuspid Valve:  The tricuspid valve is normal in structure. Tricuspid regurgitation is trivial by color flow Doppler. Aortic Valve: The aortic valve tricuspid. There is moderate thickening of the aortic valve. There is moderate calcification of the aortic valve. The calculated aortic valve area is 0.93 cm, consistent with moderate stenosis stenosis. Aortic valve  regurgitation is mild by color flow Doppler with a pressure half time of 373 msec. Pulmonic Valve: The pulmonic valve was not well visualized. The pulmonic valve is grossly normal. Pulmonic valve regurgitation is not visualized by color flow Doppler. No evidence of pulmonic stenosis. In comparison to the previous echocardiogram(s): Prior examinations were reviewed in a side by side comparison of images. There is little change in the aortic stenosis severity, but there appears to be a new limited area of hypokinesis in the left  circumflex artery distribution.   LEFT VENTRICLE PLAX 2D ([place])              Biplane EF (MOD) LV EF:          38.4 %       LV Biplane EF: LVIDd:          4.84 cm      LV A4C EF:       46.9 % LVIDs:          3.94 cm      LV A2C EF:       53.0 % LV PW:          0.60 cm LV IVS:         0.50 cm      Diastology LVOT diam:      2.20 cm      LV e' lateral:   0.1 m/s LV SV:          42 ml        LV E/e' lateral: 20.7 LVOT Area:      3.80 cm     LV e' medial:    0.0 m/s                              LV E/e'  medial:  38.7 LV Volumes (MOD) LV area d, A2C:    28.10 cm LV area d, A4C:    35.20 cm LV area s, A2C:    21.60 cm LV area s, A4C:    24.40 cm LV major d, A2C:   7.78 cm LV major d, A4C:   8.32 cm LV major s, A2C:   6.41 cm LV major s, A4C:   6.89 cm LV vol d, MOD A2C: 115.0 ml LV vol d, MOD A4C: 145.0 ml LV vol s, MOD A2C: 54.0 ml LV vol s, MOD A4C: 77.0 ml  RIGHT VENTRICLE TAPSE (M-mode): 1.5 cm RVSP:           29.6 mmHg  LEFT ATRIUM           Index       RIGHT ATRIUM           Index LA diam:      3.60 cm 1.99 cm/m  RA Pressure: 3 mmHg LA Vol (A2C): 53.1 ml 29.29 ml/m RA Area:     15.30 cm LA Vol (A4C): 39.6 ml 21.84 ml/m RA Volume:   34.30 ml  18.92 ml/m  AORTIC VALVE AV Area (Vmax):    1.00 cm AV Area (Vmean):   1.06 cm AV Area (VTI):  0.93 cm AV Vmax:           3.03 m/s AV Vmean:          2.035 m/s AV VTI:            0.675 m AV Peak Grad:      36.7 mmHg AV Mean Grad:      19.5 mmHg LVOT Vmax:         0.80 m/s LVOT Vmean:        0.566 m/s LVOT VTI:          0.165 m LVOT/AV VTI ratio: 0.24 AR PHT:            373 msec   AORTA Ao Root diam: 3.10 cm  MITRAL VALVE              TR Peak grad: 26.6 mmHg MV Area (PHT): 3.65 cm   TR Vmax:      2.58 m/s MV PHT:        60.32 msec RAP:          3 mmHg MV Decel Time: 208 msec   RVSP:         29.6 mmHg MR Peak grad: 136.4 mmHg MR Mean grad: 79.0 mmHg MR Vmax:      5.84 m/s MR Vmean:     4.1 m/s MV E velocity: 1.16 m/s MV A velocity: 1.27 m/s MV E/A ratio:  0.91    Mihai Croitoru MD Electronically signed by Sanda Klein MD Signature Date/Time: 03/26/2018/3:57:18 PM       Final       Labs/Other Tests and Data Reviewed:    EKG:  No ECG reviewed.  Recent Labs: 01/01/2018: TSH 4.34 03/25/2018: B Natriuretic Peptide 669.1 03/31/2018: Magnesium 2.1 04/08/2018: BUN 56; Creatinine, Ser 2.74; Platelets 437.0 07/09/2018: ALT 18 07/15/2018: Hemoglobin 9.5; Potassium 3.5; Sodium 138   Recent Lipid  Panel Lab Results  Component Value Date/Time   CHOL 122 07/09/2018 03:48 PM   TRIG 86 07/09/2018 03:48 PM   HDL 47 07/09/2018 03:48 PM   CHOLHDL 2.6 07/09/2018 03:48 PM   CHOLHDL 4 01/22/2017 11:55 AM   LDLCALC 58 07/09/2018 03:48 PM   LDLDIRECT 96.2 04/04/2007 10:49 AM    Wt Readings from Last 3 Encounters:  07/23/18 140 lb (63.5 kg)  07/15/18 140 lb (63.5 kg)  07/07/18 141 lb 12.8 oz (64.3 kg)     Objective:    Vital Signs:  BP (!) 142/50    Pulse 71    Ht 5\' 9"  (1.753 m)    Wt 140 lb (63.5 kg)    BMI 20.67 kg/m    VITAL SIGNS:  reviewed The patient is alert, oriented, in no distress.  He is breathing comfortably in normal conversation.  ASSESSMENT & PLAN:    1. Coronary artery disease, native vessel, without angina: The patient has advanced CAD with both native vessel and bypass graft disease.  He is clinically stable on his current medical program.  He cannot take a beta-blocker because of bradycardia and Mobitz 1 AV block.  He will continue on aspirin, clopidogrel, and atorvastatin.  If he requires surgery, he would be at acceptable risk to hold clopidogrel for 3 to 5 days before surgery as needed.  Note that he did not undergo percutaneous coronary intervention at the time of his non-STEMI in January 2020.  With that said, nothing was done to mitigate his risk of future perioperative events and I think in light of his  advanced age, ischemic heart disease, and aortic stenosis, he would have at least moderate risk of a repeated cardiac complication with surgery.  This risk could likely be mitigated somewhat with overnight observation and careful attention to his medical program.  I spoke with Dr. Marval Regal who is scheduled to see the patient next week in follow-up.  Considerations include proceeding with AV fistula surgery, changing course to the peritoneal dialysis planning, or watchful waiting. 2. Aortic stenosis: Moderate by most recent echo study, continued surveillance planned.   Should have a repeat echocardiogram at 1 year interval. 3. Second-degree type I AV block: Avoid beta-blockers.  Heart rhythm appears stable and he has had no recent lightheadedness or presyncope. 4. Hypertension with chronic kidney disease stage V: Followed by nephrology, blood pressure reasonably well controlled in light of his advanced age.  COVID-19 Education: The signs and symptoms of COVID-19 were discussed with the patient and how to seek care for testing (follow up with PCP or arrange E-visit). The importance of social distancing was discussed today.  Time:   Today, I have spent 30 minutes with the patient with telehealth technology discussing the above problems.     Medication Adjustments/Labs and Tests Ordered: Current medicines are reviewed at length with the patient today.  Concerns regarding medicines are outlined above.   Tests Ordered: No orders of the defined types were placed in this encounter.   Medication Changes: No orders of the defined types were placed in this encounter.   Disposition:  Follow up in 4 month(s)  Signed, Sherren Mocha, MD  07/23/2018 9:20 AM    [place]

## 2018-07-29 DIAGNOSIS — L821 Other seborrheic keratosis: Secondary | ICD-10-CM | POA: Diagnosis not present

## 2018-07-29 DIAGNOSIS — D3612 Benign neoplasm of peripheral nerves and autonomic nervous system, upper limb, including shoulder: Secondary | ICD-10-CM | POA: Diagnosis not present

## 2018-07-29 DIAGNOSIS — D235 Other benign neoplasm of skin of trunk: Secondary | ICD-10-CM | POA: Diagnosis not present

## 2018-07-30 DIAGNOSIS — D631 Anemia in chronic kidney disease: Secondary | ICD-10-CM | POA: Diagnosis not present

## 2018-07-30 DIAGNOSIS — N184 Chronic kidney disease, stage 4 (severe): Secondary | ICD-10-CM | POA: Diagnosis not present

## 2018-07-30 DIAGNOSIS — N189 Chronic kidney disease, unspecified: Secondary | ICD-10-CM | POA: Diagnosis not present

## 2018-07-30 DIAGNOSIS — I129 Hypertensive chronic kidney disease with stage 1 through stage 4 chronic kidney disease, or unspecified chronic kidney disease: Secondary | ICD-10-CM | POA: Diagnosis not present

## 2018-07-30 DIAGNOSIS — E1122 Type 2 diabetes mellitus with diabetic chronic kidney disease: Secondary | ICD-10-CM | POA: Diagnosis not present

## 2018-07-30 DIAGNOSIS — I214 Non-ST elevation (NSTEMI) myocardial infarction: Secondary | ICD-10-CM | POA: Diagnosis not present

## 2018-07-30 DIAGNOSIS — R809 Proteinuria, unspecified: Secondary | ICD-10-CM | POA: Diagnosis not present

## 2018-07-30 DIAGNOSIS — N179 Acute kidney failure, unspecified: Secondary | ICD-10-CM | POA: Diagnosis not present

## 2018-07-30 DIAGNOSIS — I739 Peripheral vascular disease, unspecified: Secondary | ICD-10-CM | POA: Diagnosis not present

## 2018-07-30 DIAGNOSIS — I770 Arteriovenous fistula, acquired: Secondary | ICD-10-CM | POA: Diagnosis not present

## 2018-07-30 DIAGNOSIS — N2581 Secondary hyperparathyroidism of renal origin: Secondary | ICD-10-CM | POA: Diagnosis not present

## 2018-08-01 DIAGNOSIS — M5416 Radiculopathy, lumbar region: Secondary | ICD-10-CM | POA: Diagnosis not present

## 2018-08-05 ENCOUNTER — Telehealth: Payer: Self-pay | Admitting: Internal Medicine

## 2018-08-05 ENCOUNTER — Ambulatory Visit: Payer: Medicare Other | Admitting: Physician Assistant

## 2018-08-05 NOTE — Telephone Encounter (Unsigned)
Copied from [place] [id number]. Topic: Quick Communication - See Telephone Encounter >> Aug 05, 2018  4:29 PM Loma Boston wrote: CRM for notification. See Telephone encounter for: 08/05/18.amLODipine (NORVASC) 10 MG tablet  Pt states that he wanted his PCP to do all medications if possible, this is a drug that Dr Burt Knack had said he was fine with him continuing from his hospital stay. He needs a refill as it is working well.[place], [place] - [address] [phone] (Phone) [phone] (Fax)

## 2018-08-05 NOTE — Telephone Encounter (Signed)
Copied from [place] [id number]. Topic: Quick Communication - See Telephone Encounter >> Aug 05, 2018  4:29 PM Loma Boston wrote: CRM for notification. See Telephone encounter for: 08/05/18.amLODipine (NORVASC) 10 MG tablet  Pt states that he wanted his PCP to do all medications if possible, this is a drug that Dr Burt Knack had said he was fine with him continuing from his hospital stay. He needs a refill as it is working well.[place], [place] - [address] [phone] (Phone) [phone] (Fax)

## 2018-08-06 MED ORDER — AMLODIPINE BESYLATE 10 MG PO TABS: 10.0000 mg | ORAL_TABLET | Freq: Every evening | ORAL | 3 refills | Status: DC

## 2018-08-06 NOTE — Telephone Encounter (Signed)
Done erx 

## 2018-08-06 NOTE — Addendum Note (Signed)
Addended by: [name] on: 08/06/2018 09:10 AM   Modules accepted: Orders

## 2018-08-13 ENCOUNTER — Encounter: Payer: Self-pay | Admitting: Internal Medicine

## 2018-08-13 ENCOUNTER — Ambulatory Visit (INDEPENDENT_AMBULATORY_CARE_PROVIDER_SITE_OTHER): Payer: Medicare Other | Admitting: Internal Medicine

## 2018-08-13 ENCOUNTER — Other Ambulatory Visit (HOSPITAL_COMMUNITY): Payer: Self-pay | Admitting: *Deleted

## 2018-08-13 ENCOUNTER — Other Ambulatory Visit: Payer: Self-pay

## 2018-08-13 VITALS — BP 122/74 | HR 71 | Temp 97.9°F | Ht 69.0 in | Wt 140.0 lb

## 2018-08-13 DIAGNOSIS — E7800 Pure hypercholesterolemia, unspecified: Secondary | ICD-10-CM | POA: Diagnosis not present

## 2018-08-13 DIAGNOSIS — E119 Type 2 diabetes mellitus without complications: Secondary | ICD-10-CM

## 2018-08-13 DIAGNOSIS — I2510 Atherosclerotic heart disease of native coronary artery without angina pectoris: Secondary | ICD-10-CM

## 2018-08-13 DIAGNOSIS — I10 Essential (primary) hypertension: Secondary | ICD-10-CM | POA: Diagnosis not present

## 2018-08-13 MED ORDER — HYDRALAZINE HCL 25 MG PO TABS: 25.0000 mg | ORAL_TABLET | Freq: Two times a day (BID) | ORAL | 3 refills | Status: DC

## 2018-08-13 MED ORDER — HYDRALAZINE HCL 25 MG PO TABS: 25.0000 mg | ORAL_TABLET | Freq: Two times a day (BID) | ORAL | 5 refills | Status: DC

## 2018-08-13 MED ORDER — ACCU-CHEK AVIVA PLUS VI STRP: ORAL_STRIP | 5 refills | Status: DC

## 2018-08-13 MED ORDER — ACCU-CHEK AVIVA PLUS VI STRP: ORAL_STRIP | 11 refills | Status: DC

## 2018-08-13 NOTE — Assessment & Plan Note (Signed)
stable overall by history and exam, recent data reviewed with pt, and pt to continue medical treatment as before,  to f/u any worsening symptoms or concerns, decline a1c today

## 2018-08-13 NOTE — Patient Instructions (Addendum)
My NPI number is:  [id number], and this was added to your prescriptions today  Please continue all other medications as before, and refills have been done if requested.  Please have the pharmacy call with any other refills you may need.  Please continue your efforts at being more active, low cholesterol diet, and weight control.  Please keep your appointments with your specialists as you may have planned - the iron infusion tomorrow  Please return in 6 months, or sooner if needed

## 2018-08-13 NOTE — Progress Notes (Signed)
Subjective:    Patient ID: Henry [name], male    DOB: 07/15/1924, 85 y.o.   MRN: [id number]  HPI  Here for yearly f/u;  Overall doing ok;  Pt denies Chest pain, worsening SOB, DOE, wheezing, orthopnea, PND, worsening LE edema, palpitations, dizziness or syncope.  Pt denies neurological change such as new headache, facial or extremity weakness.  Pt denies polydipsia, polyuria, or low sugar symptoms. Pt states overall good compliance with treatment and medications, good tolerability, and has been trying to follow appropriate diet.  Pt denies worsening depressive symptoms, suicidal ideation or panic. No fever, night sweats, wt loss, loss of appetite, or other constitutional symptoms.  Pt states good ability with ADL's, has low fall risk, home safety reviewed and adequate, no other significant changes in hearing or vision, and onot active with exercise.  Continues to care for his wife with dementia, stressful.  Also maybe for repeat AV shunt placement but not yet decided.  He is scheduled for iron infusion tomorrow Past Medical History:  Diagnosis Date  . Anemia   . CAD of autologous bypass graft   . Carotid artery disease ([place])   . Diabetes mellitus without complication ([place])   . DJD (degenerative joint disease)   . History of anemia of chronic disease   . Hypercholesteremia   . Hypertension   . NSTEMI (non-ST elevated myocardial infarction) ([place])   . PVD (peripheral vascular disease) with claudication ([place])   . Renal insufficiency   . Wears dentures   . Wears glasses    Past Surgical History:  Procedure Laterality Date  . A/V FISTULAGRAM N/A 07/15/2018   Procedure: A/V FISTULAGRAM - Left Arm;  Surgeon: Serafina Mitchell, MD;  Location: [facility];  Service: Cardiovascular;  Laterality: N/A;  . AV FISTULA PLACEMENT Left 03/20/2018   Procedure: ARTERIOVENOUS (AV) FISTULA CREATION LEFT ARM;  Surgeon: Serafina Mitchell, MD;  [facility];  Service: Vascular;  Laterality: Left;  .  cataract surgery    . COLONOSCOPY     Hx: of  . CORONARY ARTERY BYPASS GRAFT  2000   by Dr. Cyndia Bent  . LEFT HEART CATH AND CORS/GRAFTS ANGIOGRAPHY N/A 03/27/2018   Procedure: LEFT HEART CATH AND CORS/GRAFTS ANGIOGRAPHY;  Surgeon: Sherren Mocha, MD;  Location: [facility];  Service: Cardiovascular;  Laterality: N/A;  . LUMBAR LAMINECTOMY/DECOMPRESSION MICRODISCECTOMY N/A 09/10/2012   Procedure: LUMBAR DECOMPRESSION,  IN SITU FUSION LUMBAR 4-5;  Surgeon: Melina Schools, MD;  Location: [facility];  Service: Orthopedics;  Laterality: N/A;  . MULTIPLE TOOTH EXTRACTIONS    . TONSILLECTOMY    . UPPER EXTREMITY VENOGRAPHY Left 07/15/2018   Procedure: UPPER EXTREMITY VENOGRAPHY;  Surgeon: Serafina Mitchell, MD;  Location: [facility];  Service: Cardiovascular;  Laterality: Left;  UPPER ARM    reports that he quit smoking about 65 years ago. His smoking use included cigarettes. He has a 10.00 pack-year smoking history. He has never used smokeless tobacco. He reports previous alcohol use. He reports that he does not use drugs. family history includes Heart disease in his father and mother. No Known Allergies Current Outpatient Medications on File Prior to Visit  Medication Sig Dispense Refill  . amLODipine (NORVASC) 10 MG tablet Take 1 tablet (10 mg total) by mouth every evening. 90 tablet 3  . aspirin EC 81 MG tablet Take 81 mg by mouth daily.     Marland Kitchen atorvastatin (LIPITOR) 40 MG tablet Take 1 tablet (40 mg total)  by mouth every evening. 30 tablet 0  . b complex-vitamin c-folic acid (NEPHRO-VITE) 0.8 MG TABS tablet Take 1 tablet by mouth at bedtime.    . calcitRIOL (ROCALTROL) 0.25 MCG capsule Take 0.25 mcg by mouth 2 (two) times a week.    . calcium acetate (PHOSLO) 667 MG capsule Take 667 mg by mouth 2 (two) times daily with a meal.    . clopidogrel (PLAVIX) 75 MG tablet Take 1 tablet (75 mg total) by mouth daily. 30 tablet 0  . Darbepoetin Alfa (ARANESP) 100 MCG/0.5ML SOSY injection Inject 100  mcg into the skin every 30 (thirty) days.     . ferrous gluconate (FERGON) 325 MG tablet Take 325 mg by mouth daily.     . furosemide (LASIX) 40 MG tablet Take 40 mg by mouth daily.    . Lancets (ACCU-CHEK MULTICLIX) lancets USE TWICE DAILY. 102 each 5  . levothyroxine (SYNTHROID, LEVOTHROID) 50 MCG tablet TAKE ONE TABLET BY MOUTH ONCE DAILY BEFORE BREAKFAST (Patient taking differently: Take 50 mcg by mouth daily before breakfast. ) 90 tablet 3  . Multiple Vitamins-Minerals (PRESERVISION AREDS 2 PO) Take 1 tablet by mouth daily.    . traMADol (ULTRAM) 50 MG tablet Take 1 tablet (50 mg total) by mouth every 6 (six) hours as needed. (Patient taking differently: Take 50 mg by mouth every 6 (six) hours as needed for moderate pain. ) 20 tablet 0   No current facility-administered medications on file prior to visit.    Review of Systems  Constitutional: Negative for other unusual diaphoresis or sweats HENT: Negative for ear discharge or swelling Eyes: Negative for other worsening visual disturbances Respiratory: Negative for stridor or other swelling  Gastrointestinal: Negative for worsening distension or other blood Genitourinary: Negative for retention or other urinary change Musculoskeletal: Negative for other MSK pain or swelling Skin: Negative for color change or other new lesions Neurological: Negative for worsening tremors and other numbness  Psychiatric/Behavioral: Negative for worsening agitation or other fatigue All other system neg per pt    Objective:   Physical Exam BP 122/74   Pulse 71   Temp 97.9 F (36.6 C) (Oral)   Ht 5\' 9"  (1.753 m)   Wt 140 lb (63.5 kg)   SpO2 97%   BMI 20.67 kg/m  VS noted,  Constitutional: Pt appears in NAD HENT: Head: NCAT.  Right Ear: External ear normal.  Left Ear: External ear normal.  Eyes: . Pupils are equal, round, and reactive to light. Conjunctivae and EOM are normal Nose: without d/c or deformity Neck: Neck supple. Gross normal ROM  Cardiovascular: Normal rate and regular rhythm.   Pulmonary/Chest: Effort normal and breath sounds without rales or wheezing.  Abd:  Soft, NT, ND, + BS, no organomegaly Neurological: Pt is alert. At baseline orientation, motor grossly intact Skin: Skin is warm. No rashes, other new lesions, no LE edema Psychiatric: Pt behavior is normal without agitation  No other exam findings Lab Results  Component Value Date   WBC 7.5 04/08/2018   HGB 9.5 (L) 07/15/2018   HCT 28.0 (L) 07/15/2018   PLT 437.0 (H) 04/08/2018   GLUCOSE 93 07/15/2018   CHOL 122 07/09/2018   TRIG 86 07/09/2018   HDL 47 07/09/2018   LDLDIRECT 96.2 04/04/2007   LDLCALC 58 07/09/2018   ALT 18 07/09/2018   AST 19 07/09/2018   NA 138 07/15/2018   K 3.5 07/15/2018   CL 99 04/08/2018   CREATININE 2.74 (H) 04/08/2018   BUN  56 (H) 04/08/2018   CO2 26 04/08/2018   TSH 4.34 01/01/2018   PSA 1.47 01/03/2010   INR 1.13 03/25/2018   HGBA1C 6.1 (H) 03/28/2018   MICROALBUR 7.8 (H) 06/08/2008       Assessment & Plan:

## 2018-08-13 NOTE — Assessment & Plan Note (Signed)
stable overall by history and exam, recent data reviewed with pt, and pt to continue medical treatment as before,  to f/u any worsening symptoms or concerns  

## 2018-08-14 ENCOUNTER — Ambulatory Visit (HOSPITAL_COMMUNITY)
Admission: RE | Admit: 2018-08-14 | Discharge: 2018-08-14 | Disposition: A | Discharge status: Home / Self Care | Payer: Medicare Other | Source: Ambulatory Visit | Attending: Nephrology | Admitting: Nephrology

## 2018-08-14 DIAGNOSIS — D631 Anemia in chronic kidney disease: Secondary | ICD-10-CM | POA: Diagnosis not present

## 2018-08-14 MED ORDER — SODIUM CHLORIDE 0.9 % IV SOLN
510.0000 mg | INTRAVENOUS | Status: DC
  Administered 2018-08-14: 510 mg via INTRAVENOUS
  Filled 2018-08-14: qty 510

## 2018-08-16 ENCOUNTER — Emergency Department (HOSPITAL_COMMUNITY)
Admission: EM | Admit: 2018-08-16 | Discharge: 2018-08-16 | Disposition: A | Discharge status: Home / Self Care | Payer: Medicare Other | Attending: Emergency Medicine | Admitting: Emergency Medicine

## 2018-08-16 ENCOUNTER — Emergency Department (HOSPITAL_COMMUNITY): Payer: Medicare Other

## 2018-08-16 ENCOUNTER — Other Ambulatory Visit: Payer: Self-pay

## 2018-08-16 ENCOUNTER — Encounter (HOSPITAL_COMMUNITY): Payer: Self-pay | Admitting: Emergency Medicine

## 2018-08-16 DIAGNOSIS — I2510 Atherosclerotic heart disease of native coronary artery without angina pectoris: Secondary | ICD-10-CM | POA: Diagnosis not present

## 2018-08-16 DIAGNOSIS — Z7982 Long term (current) use of aspirin: Secondary | ICD-10-CM | POA: Insufficient documentation

## 2018-08-16 DIAGNOSIS — R1030 Lower abdominal pain, unspecified: Secondary | ICD-10-CM | POA: Diagnosis present

## 2018-08-16 DIAGNOSIS — I5032 Chronic diastolic (congestive) heart failure: Secondary | ICD-10-CM | POA: Diagnosis not present

## 2018-08-16 DIAGNOSIS — R42 Dizziness and giddiness: Secondary | ICD-10-CM | POA: Insufficient documentation

## 2018-08-16 DIAGNOSIS — I132 Hypertensive heart and chronic kidney disease with heart failure and with stage 5 chronic kidney disease, or end stage renal disease: Secondary | ICD-10-CM | POA: Diagnosis not present

## 2018-08-16 DIAGNOSIS — K4090 Unilateral inguinal hernia, without obstruction or gangrene, not specified as recurrent: Secondary | ICD-10-CM | POA: Diagnosis not present

## 2018-08-16 DIAGNOSIS — N281 Cyst of kidney, acquired: Secondary | ICD-10-CM | POA: Diagnosis not present

## 2018-08-16 DIAGNOSIS — Z87891 Personal history of nicotine dependence: Secondary | ICD-10-CM | POA: Diagnosis not present

## 2018-08-16 DIAGNOSIS — N185 Chronic kidney disease, stage 5: Secondary | ICD-10-CM | POA: Diagnosis not present

## 2018-08-16 DIAGNOSIS — E1122 Type 2 diabetes mellitus with diabetic chronic kidney disease: Secondary | ICD-10-CM | POA: Insufficient documentation

## 2018-08-16 DIAGNOSIS — Z951 Presence of aortocoronary bypass graft: Secondary | ICD-10-CM | POA: Insufficient documentation

## 2018-08-16 DIAGNOSIS — Z7902 Long term (current) use of antithrombotics/antiplatelets: Secondary | ICD-10-CM | POA: Insufficient documentation

## 2018-08-16 DIAGNOSIS — K5730 Diverticulosis of large intestine without perforation or abscess without bleeding: Secondary | ICD-10-CM | POA: Diagnosis not present

## 2018-08-16 DIAGNOSIS — E039 Hypothyroidism, unspecified: Secondary | ICD-10-CM | POA: Diagnosis not present

## 2018-08-16 LAB — CBC
HCT: 30.8 % — ABNORMAL LOW (ref 39.0–52.0)
Hemoglobin: 9.7 g/dL — ABNORMAL LOW (ref 13.0–17.0)
MCH: 29 pg (ref 26.0–34.0)
MCHC: 31.5 g/dL (ref 30.0–36.0)
MCV: 91.9 fL (ref 80.0–100.0)
Platelets: 198 10*3/uL (ref 150–400)
RBC: 3.35 MIL/uL — ABNORMAL LOW (ref 4.22–5.81)
RDW: 15.7 % — ABNORMAL HIGH (ref 11.5–15.5)
WBC: 5.8 10*3/uL (ref 4.0–10.5)
nRBC: 0 % (ref 0.0–0.2)

## 2018-08-16 LAB — COMPREHENSIVE METABOLIC PANEL
ALT: 29 U/L (ref 0–44)
AST: 33 U/L (ref 15–41)
Albumin: 3.8 g/dL (ref 3.5–5.0)
Alkaline Phosphatase: 76 U/L (ref 38–126)
Anion gap: 13 (ref 5–15)
BUN: 54 mg/dL — ABNORMAL HIGH (ref 8–23)
CO2: 20 mmol/L — ABNORMAL LOW (ref 22–32)
Calcium: 9.2 mg/dL (ref 8.9–10.3)
Chloride: 103 mmol/L (ref 98–111)
Creatinine, Ser: 2.81 mg/dL — ABNORMAL HIGH (ref 0.61–1.24)
GFR calc Af Amer: 22 mL/min — ABNORMAL LOW (ref >60)
GFR calc non Af Amer: 19 mL/min — ABNORMAL LOW (ref >60)
Glucose, Bld: 118 mg/dL — ABNORMAL HIGH (ref 70–99)
Potassium: 3.8 mmol/L (ref 3.5–5.1)
Sodium: 136 mmol/L (ref 135–145)
Total Bilirubin: 1 mg/dL (ref 0.3–1.2)
Total Protein: 7.2 g/dL (ref 6.5–8.1)

## 2018-08-16 LAB — LIPASE, BLOOD: Lipase: 28 U/L (ref 11–51)

## 2018-08-16 MED ORDER — DOCUSATE SODIUM 100 MG PO CAPS: 100.0000 mg | ORAL_CAPSULE | Freq: Two times a day (BID) | ORAL | 0 refills | Status: DC

## 2018-08-16 MED ORDER — SIMETHICONE 80 MG PO CHEW: 80.0000 mg | CHEWABLE_TABLET | Freq: Four times a day (QID) | ORAL | 0 refills | Status: DC | PRN

## 2018-08-16 MED ORDER — SODIUM CHLORIDE 0.9 % IV SOLN
Freq: Once | INTRAVENOUS | Status: AC
  Administered 2018-08-16: 12:00:00 via INTRAVENOUS

## 2018-08-16 MED ORDER — SODIUM CHLORIDE 0.9% FLUSH: 3.0000 mL | Freq: Once | INTRAVENOUS | Status: DC

## 2018-08-16 NOTE — Discharge Instructions (Addendum)
1.  Avoid all straining or activities that increase pressure in the abdomen.  This can push out your hernia.  If your hernia protrudes, lying flat on your back, try to relax and put some steady pressure over the whole area.  If it will not go back down or is severely painful, return to the emergency department.  You may take Tylenol for pain if you needed. Make Sure you do not get constipated.  Take Colace twice a day and avoid any straining at bowel movement. 2.  If you have any concerning symptoms return to the emergency department.  Try to see your doctor for recheck at the beginning of the week.

## 2018-08-16 NOTE — ED Triage Notes (Signed)
C/o episode of abd pain with diaphoresis, "feels like gas" -- had a bowel movement without relief. Pain is 5/10

## 2018-08-16 NOTE — ED Provider Notes (Signed)
[facility] EMERGENCY DEPARTMENT Provider Note   CSN: [id number] Arrival date & time: 08/16/18  1108    History   Chief Complaint Chief Complaint  Patient presents with  . Dizziness  . Abdominal Pain    HPI Henry [name] is a 85 y.o. male.     HPI Patient reports that he was feeling well yesterday.  He has been eating as per usual without difficulty.  He reports today he started getting cramping lower abdominal pain.  Ports it almost feels like a gas pain but it has been very uncomfortable for him and made him feel nauseated, lightheaded and sweaty when it is occurring.  Reports he had a normal bowel movement today and it did not relieve the symptoms at all.  He denies any pain burning or urgency with urination.  No urinary incontinence.  No vomiting but feels nauseated when the pain is occurring.  He denies any frequency of problems with constipation or diarrhea. Past Medical History:  Diagnosis Date  . Anemia   . CAD of autologous bypass graft   . Carotid artery disease ([place])   . Diabetes mellitus without complication ([place])   . DJD (degenerative joint disease)   . History of anemia of chronic disease   . Hypercholesteremia   . Hypertension   . NSTEMI (non-ST elevated myocardial infarction) ([place])   . PVD (peripheral vascular disease) with claudication ([place])   . Renal insufficiency   . Wears dentures   . Wears glasses     Patient Active Problem List   Diagnosis Date Noted  . Mobitz type 1 second degree atrioventricular block 05/02/2018  . Chronic diastolic CHF (congestive heart failure) ([place])   . NSTEMI 02/2018 - 2/4 grafts occluded; Med Rx   . Respiratory failure with hypoxia ([place]) 03/25/2018  . Pulmonary edema 03/25/2018  . Respiratory failure, acute ([place]) 03/25/2018  . Elevated troponin 03/25/2018  . Vitamin D deficiency 01/01/2018  . CKD (chronic kidney disease) stage 5, GFR less than 15 ml/min (H[facility]) 07/24/2017  . Carotid artery disease ([place])    . Hypertension   . PVD (peripheral vascular disease) with claudication ([place])   . Peripheral vascular disease with claudication ([place]) 01/22/2017  . Aortic valve stenosis, unspecified etiology 01/22/2017  . Postinflammatory pulmonary fibrosis ([place]) 07/31/2016  . Acute bronchitis 03/07/2016  . Hypothyroidism 08/31/2014  . Carotid stenosis 08/19/2012  . DJD (degenerative joint disease) 08/19/2012  . LBP (low back pain) 08/19/2012  . Cerebral atrophy ([place]) 08/19/2012  . Dizziness 04/13/2012  . Diabetes mellitus type 2, uncomplicated ([place]) 07/37/1062  . Nausea and vomiting 04/10/2012  . CAD (coronary artery disease) 07/13/2008  . HYPERCHOLESTEROLEMIA 04/04/2007  . Anemia of chronic disease 04/04/2007    Past Surgical History:  Procedure Laterality Date  . A/V FISTULAGRAM N/A 07/15/2018   Procedure: A/V FISTULAGRAM - Left Arm;  Surgeon: Serafina Mitchell, MD;  Location: [facility];  Service: Cardiovascular;  Laterality: N/A;  . AV FISTULA PLACEMENT Left 03/20/2018   Procedure: ARTERIOVENOUS (AV) FISTULA CREATION LEFT ARM;  Surgeon: Serafina Mitchell, MD;  [facility];  Service: Vascular;  Laterality: Left;  . cataract surgery    . COLONOSCOPY     Hx: of  . CORONARY ARTERY BYPASS GRAFT  2000   by Dr. Cyndia Bent  . LEFT HEART CATH AND CORS/GRAFTS ANGIOGRAPHY N/A 03/27/2018   Procedure: LEFT HEART CATH AND CORS/GRAFTS ANGIOGRAPHY;  Surgeon: Sherren Mocha, MD;  Location: [facility];  Service:  Cardiovascular;  Laterality: N/A;  . LUMBAR LAMINECTOMY/DECOMPRESSION MICRODISCECTOMY N/A 09/10/2012   Procedure: LUMBAR DECOMPRESSION,  IN SITU FUSION LUMBAR 4-5;  Surgeon: Melina Schools, MD;  Location: [facility];  Service: Orthopedics;  Laterality: N/A;  . MULTIPLE TOOTH EXTRACTIONS    . TONSILLECTOMY    . UPPER EXTREMITY VENOGRAPHY Left 07/15/2018   Procedure: UPPER EXTREMITY VENOGRAPHY;  Surgeon: Serafina Mitchell, MD;  Location: [facility];  Service: Cardiovascular;  Laterality:  Left;  UPPER ARM        Home Medications    Prior to Admission medications   Medication Sig Start Date End Date Taking? Authorizing Provider  amLODipine (NORVASC) 10 MG tablet Take 1 tablet (10 mg total) by mouth every evening. 08/06/18   [name], MD  aspirin EC 81 MG tablet Take 81 mg by mouth daily.     [provider]  atorvastatin (LIPITOR) 40 MG tablet Take 1 tablet (40 mg total) by mouth every evening. 04/02/18   Mariel Aloe, MD  b complex-vitamin c-folic acid (NEPHRO-VITE) 0.8 MG TABS tablet Take 1 tablet by mouth at bedtime.    [provider]  calcitRIOL (ROCALTROL) 0.25 MCG capsule Take 0.25 mcg by mouth 2 (two) times a week.    [provider]  calcium acetate (PHOSLO) 667 MG capsule Take 667 mg by mouth 2 (two) times daily with a meal.    [provider]  clopidogrel (PLAVIX) 75 MG tablet Take 1 tablet (75 mg total) by mouth daily. 04/03/18   Mariel Aloe, MD  Darbepoetin Alfa (ARANESP) 100 MCG/0.5ML SOSY injection Inject 100 mcg into the skin every 30 (thirty) days.     [provider]  docusate sodium (COLACE) 100 MG capsule Take 1 capsule (100 mg total) by mouth every 12 (twelve) hours. 08/16/18   Charlesetta Shanks, MD  ferrous gluconate (FERGON) 325 MG tablet Take 325 mg by mouth daily.     [provider]  furosemide (LASIX) 40 MG tablet Take 40 mg by mouth daily.    [provider]  glucose blood (ACCU-CHEK AVIVA PLUS) test strip CHECK BLOOD SUGAR TWICE A DAY. E11.9 08/13/18   [name], MD  hydrALAZINE (APRESOLINE) 25 MG tablet Take 1 tablet (25 mg total) by mouth 2 (two) times daily. 08/13/18   [name], MD  Lancets (ACCU-CHEK MULTICLIX) lancets USE TWICE DAILY. 03/29/15   Noralee Space, MD  levothyroxine (SYNTHROID, LEVOTHROID) 50 MCG tablet TAKE ONE TABLET BY MOUTH ONCE DAILY BEFORE BREAKFAST Patient taking differently: Take 50 mcg by mouth daily before breakfast.  01/18/17   Noralee Space, MD   Multiple Vitamins-Minerals (PRESERVISION AREDS 2 PO) Take 1 tablet by mouth daily.    [provider]  simethicone (GAS-X) 80 MG chewable tablet Chew 1 tablet (80 mg total) by mouth every 6 (six) hours as needed for flatulence. 08/16/18   Charlesetta Shanks, MD  traMADol (ULTRAM) 50 MG tablet Take 1 tablet (50 mg total) by mouth every 6 (six) hours as needed. Patient taking differently: Take 50 mg by mouth every 6 (six) hours as needed for moderate pain.  03/20/18   Serafina Mitchell, MD    Family History Family History  Problem Relation Age of Onset  . Heart disease Mother   . Heart disease Father     Social History Social History   Tobacco Use  . Smoking status: Former Smoker    Packs/day: 1.00    Years: 10.00  Pack years: 10.00    Types: Cigarettes    Quit date: 02/26/1953    Years since quitting: 65.5  . Smokeless tobacco: Never Used  Substance Use Topics  . Alcohol use: Not Currently    Alcohol/week: 0.0 standard drinks  . Drug use: No     Allergies   Patient has no known allergies.   Review of Systems Review of Systems 10 Systems reviewed and are negative for acute change except as noted in the HPI.   Physical Exam Updated Vital Signs BP (!) 154/58   Pulse 68   Temp (!) 97.5 F (36.4 C) (Oral)   Resp 16   Ht 5\' 9"  (1.753 m)   Wt 63.5 kg   SpO2 99%   BMI 20.67 kg/m   Physical Exam Constitutional:      Comments: Alert and nontoxic.  Mental status clear.  No respiratory distress.  HENT:     Head: Normocephalic and atraumatic.  Cardiovascular:     Comments: Heart irregular with occasional ectopy.  3\6 systolic ejection murmur. Pulmonary:     Effort: Pulmonary effort is normal.     Breath sounds: Normal breath sounds.  Abdominal:     Comments: Abdomen is soft.  Patient has moderate to severe pain in the left lower quadrant.  Also a firm approximately 5 cm smooth nodular mass in the left inguinal region.  No overlying erythema or skin changes.   Musculoskeletal:     Comments: Left upper arm fistula healing well.  Resolving bruising but condition very good with normal thrill.  Skin:    General: Skin is warm and dry.  Neurological:     General: No focal deficit present.     Mental Status: He is oriented to person, place, and time.     Coordination: Coordination normal.      ED Treatments / Results  Labs (all labs ordered are listed, but only abnormal results are displayed) Labs Reviewed  COMPREHENSIVE METABOLIC PANEL - Abnormal; Notable for the following components:      Result Value   CO2 20 (*)    Glucose, Bld 118 (*)    BUN 54 (*)    Creatinine, Ser 2.81 (*)    GFR calc non Af Amer 19 (*)    GFR calc Af Amer 22 (*)    All other components within normal limits  CBC - Abnormal; Notable for the following components:   RBC 3.35 (*)    Hemoglobin 9.7 (*)    HCT 30.8 (*)    RDW 15.7 (*)    All other components within normal limits  LIPASE, BLOOD  URINALYSIS, ROUTINE W REFLEX MICROSCOPIC    EKG EKG Interpretation  Date/Time:  Saturday August 16 2018 11:20:58 EDT Ventricular Rate:  61 PR Interval:    QRS Duration: 106 QT Interval:  434 QTC Calculation: 438 R Axis:   35 Text Interpretation:  Sinus rhythm Prolonged PR interval Probable left atrial enlargement LVH with secondary repolarization abnormality similar to old tracings Confirmed by Charlesetta Shanks [phone]) on 08/16/2018 12:19:59 PM   Radiology Ct Abdomen Pelvis Wo Contrast  Result Date: 08/16/2018 CLINICAL DATA:  Left lower quadrant pain and diaphoresis beginning this morning. Possible diverticulitis. EXAM: CT ABDOMEN AND PELVIS WITHOUT CONTRAST TECHNIQUE: Multidetector CT imaging of the abdomen and pelvis was performed following the standard protocol without IV contrast. COMPARISON:  05/10/2017 FINDINGS: Lower chest: Stable chronic interstitial disease in the visualized mid to lower lungs. Mild cardiomegaly. Calcified plaque over the right  coronary and  lateral circumflex coronary arteries. Calcified plaque over the descending thoracic aorta. Hepatobiliary: Liver, gallbladder and biliary tree are within normal. Pancreas: Normal. Spleen: Normal. Adrenals/Urinary Tract: Adrenal glands are normal. Kidneys are normal in size without hydronephrosis or nephrolithiasis. Small bilateral renal cysts unchanged. Ureters and bladder are normal. Stomach/Bowel: Stomach and small bowel are normal. Appendix is normal. Diverticulosis of the colon most prominent over the distal descending and sigmoid colon. Evidence of acute diverticulitis. Vascular/Lymphatic: Moderate calcified plaque over the abdominal aorta and iliac arteries. No adenopathy. Reproductive: Normal. Other: Focal oval fluid collection over the proximal left inguinal canal as there is a small left inguinal hernia containing this fluid and mild peritoneal fat. No significant free peritoneal fluid or focal inflammatory change. Musculoskeletal: Degenerative change of the spine and hips. Subtle grade 1 anterolisthesis of L4 on L5. IMPRESSION: No acute findings in the abdomen/pelvis. Moderate colonic diverticulosis without active inflammation. Stable interstitial disease over the lung bases. Aortic Atherosclerosis (ICD10-I70.0). Atherosclerotic coronary artery disease. Mild cardiomegaly. Small bilateral renal cysts. Small left inguinal hernia with small focus of fluid within the hernia sac. Electronically Signed   By: Marin Olp M.D.   On: 08/16/2018 14:29    Procedures Procedures (including critical care time)  Medications Ordered in ED Medications  sodium chloride flush (NS) 0.9 % injection 3 mL (3 mLs Intravenous Not Given 08/16/18 1144)  0.9 %  sodium chloride infusion ( Intravenous New Bag/Given 08/16/18 1211)     Initial Impression / Assessment and Plan / ED Course  I have reviewed the triage vital signs and the nursing notes.  Pertinent labs & imaging results that were available during my care of  the patient were reviewed by me and considered in my medical decision making (see chart for details).       Patient presents with physical exam consistent with a inguinal hernia.  It was visualized on CT.  Upon rechecking the patient after completion of diagnostic evaluation, patient reports pain is completely resolved.  Reexamination shows that the hernia has reduced.  Is no longer palpable.  Patient reports he felt dizzy at the time he was having lower abdominal cramping and had been straining at stool.  This time I suspect that was vasovagal sensation in response to discomfort.  Patient's vital signs have been stable with blood pressures in the 150s to 160s and heart rate in the 60s.  Patient did not experience any chest pain or shortness of breath.  At this time recommendations are for stool softeners, avoiding all straining that could result in hernia, follow-up with PCP and return precautions reviewed.  Final Clinical Impressions(s) / ED Diagnoses   Final diagnoses:  Non-recurrent unilateral inguinal hernia without obstruction or gangrene  Light headed    ED Discharge Orders         Ordered    simethicone (GAS-X) 80 MG chewable tablet  Every 6 hours PRN     08/16/18 1502    docusate sodium (COLACE) 100 MG capsule  Every 12 hours     08/16/18 1502           Charlesetta Shanks, MD 08/16/18 1507

## 2018-08-21 ENCOUNTER — Other Ambulatory Visit: Payer: Self-pay

## 2018-08-21 ENCOUNTER — Ambulatory Visit (HOSPITAL_COMMUNITY)
Admission: RE | Admit: 2018-08-21 | Discharge: 2018-08-21 | Disposition: A | Discharge status: Home / Self Care | Payer: Medicare Other | Source: Ambulatory Visit | Attending: Nephrology | Admitting: Nephrology

## 2018-08-21 DIAGNOSIS — D631 Anemia in chronic kidney disease: Secondary | ICD-10-CM | POA: Insufficient documentation

## 2018-08-21 DIAGNOSIS — N189 Chronic kidney disease, unspecified: Secondary | ICD-10-CM | POA: Diagnosis not present

## 2018-08-21 MED ORDER — SODIUM CHLORIDE 0.9 % IV SOLN
510.0000 mg | INTRAVENOUS | Status: DC
  Administered 2018-08-21: 510 mg via INTRAVENOUS
  Filled 2018-08-21: qty 510

## 2018-08-21 NOTE — Discharge Instructions (Signed)

## 2018-08-22 ENCOUNTER — Telehealth: Payer: Self-pay | Admitting: Physician Assistant

## 2018-08-22 NOTE — Telephone Encounter (Signed)
New Message            Patient is calling today to get clarification on his "Atorvastation". He wants to know which provider increased it and what was the amount it was increased to/. Pls advise.

## 2018-08-22 NOTE — Telephone Encounter (Signed)
Spoke with patient who had concerns about his Atorvastatin. First confirmed with patient that he is taking the correct dose of Atorvastatin 40 mg once. Patient is and gets his refills through the [facility].  Patient wanted to know when his dose was increased which was in Jan hospital visit. Cardiology progress note 03-26-18 states Increase Atorvastatin to 40 mg once a day reason Hyperlipidemia.  Patient wrote down information  Stated thats all he needed and thanked me for looking information up.

## 2018-08-27 ENCOUNTER — Telehealth: Payer: Self-pay | Admitting: Cardiovascular Disease

## 2018-08-27 NOTE — Telephone Encounter (Signed)
New message   Pt c/o medication issue:  1. Name of Medication: carvedilol 3.5 mg    2. How are you currently taking this medication (dosage and times per day)? Two times daily   3. Are you having a reaction (difficulty breathing--STAT)?no   4. What is your medication issue? Patient wants to know it is okay to take this medication. Please advise.

## 2018-08-27 NOTE — Telephone Encounter (Signed)
Pt had a phone visit with Dr. Johnanna Schneiders at the [facility].  Dr. Johnanna Schneiders prescribed Carvedilol 3.125mg  BID due to HTN and CHF.  Pt wanted to ru this by Dr. Burt Knack before starting because he was concerned about drug interactions and being on too many meds.  Advised I will send to Dr. Burt Knack and out PharmD team to review.

## 2018-08-27 NOTE — Telephone Encounter (Signed)
Office note from [facility] in February reports beta blocker therapy was previously stopped due to second-degree AV block. Would await input from Dr Burt Knack regarding safety of starting low dose beta blocker in this setting.

## 2018-08-28 NOTE — Telephone Encounter (Signed)
I would avoid beta-blockers. He's had AV block and marked bradycardia in the past. thx

## 2018-08-28 NOTE — Telephone Encounter (Signed)
Left message to call back  

## 2018-09-03 NOTE — Telephone Encounter (Signed)
Informed the patient he should avoid beta-blockers. He states another doctor from [facility] called and already gave him instructions not to take it. He was grateful for call and agrees with treatment plan.

## 2018-09-12 ENCOUNTER — Telehealth: Payer: Self-pay | Admitting: *Deleted

## 2018-09-12 NOTE — Telephone Encounter (Signed)
Per Telephone conversation with patient on 6/3/2020Patient states he is holding off doing any further surgery on HD access. He claims nephrologist states he is "stable for now and does not need to start hemodialysis at this time". Instructed patient HE to call this office when needed to address intervention with right arm HD access. Verbalized understanding

## 2018-10-21 DIAGNOSIS — I770 Arteriovenous fistula, acquired: Secondary | ICD-10-CM | POA: Diagnosis not present

## 2018-10-21 DIAGNOSIS — N179 Acute kidney failure, unspecified: Secondary | ICD-10-CM | POA: Diagnosis not present

## 2018-10-21 DIAGNOSIS — N2581 Secondary hyperparathyroidism of renal origin: Secondary | ICD-10-CM | POA: Diagnosis not present

## 2018-10-21 DIAGNOSIS — R809 Proteinuria, unspecified: Secondary | ICD-10-CM | POA: Diagnosis not present

## 2018-10-21 DIAGNOSIS — E1122 Type 2 diabetes mellitus with diabetic chronic kidney disease: Secondary | ICD-10-CM | POA: Diagnosis not present

## 2018-10-21 DIAGNOSIS — N184 Chronic kidney disease, stage 4 (severe): Secondary | ICD-10-CM | POA: Diagnosis not present

## 2018-10-21 DIAGNOSIS — I739 Peripheral vascular disease, unspecified: Secondary | ICD-10-CM | POA: Diagnosis not present

## 2018-10-21 DIAGNOSIS — D631 Anemia in chronic kidney disease: Secondary | ICD-10-CM | POA: Diagnosis not present

## 2018-10-21 DIAGNOSIS — I129 Hypertensive chronic kidney disease with stage 1 through stage 4 chronic kidney disease, or unspecified chronic kidney disease: Secondary | ICD-10-CM | POA: Diagnosis not present

## 2018-10-21 DIAGNOSIS — N189 Chronic kidney disease, unspecified: Secondary | ICD-10-CM | POA: Diagnosis not present

## 2018-10-21 DIAGNOSIS — I214 Non-ST elevation (NSTEMI) myocardial infarction: Secondary | ICD-10-CM | POA: Diagnosis not present

## 2018-12-15 ENCOUNTER — Other Ambulatory Visit: Payer: Self-pay

## 2018-12-15 ENCOUNTER — Ambulatory Visit (INDEPENDENT_AMBULATORY_CARE_PROVIDER_SITE_OTHER): Payer: Medicare Other | Admitting: Cardiovascular Disease

## 2018-12-15 ENCOUNTER — Encounter: Payer: Self-pay | Admitting: Cardiovascular Disease

## 2018-12-15 VITALS — BP 142/68 | HR 48 | Ht 69.0 in | Wt 148.8 lb

## 2018-12-15 DIAGNOSIS — I5032 Chronic diastolic (congestive) heart failure: Secondary | ICD-10-CM

## 2018-12-15 DIAGNOSIS — I2510 Atherosclerotic heart disease of native coronary artery without angina pectoris: Secondary | ICD-10-CM | POA: Diagnosis not present

## 2018-12-15 DIAGNOSIS — I350 Nonrheumatic aortic (valve) stenosis: Secondary | ICD-10-CM | POA: Diagnosis not present

## 2018-12-15 NOTE — Progress Notes (Signed)
Cardiology Office Note:    Date:  12/15/2018   ID:  Henry [name], DOB [birth year], MRN [id number]  PCP:  [name], MD  Cardiologist:  Sherren Mocha, MD  Electrophysiologist:  None   Referring MD: [name], MD   Chief Complaint  Patient presents with   Leg Swelling   Coronary Artery Disease    History of Present Illness:    Henry [name] is a 85 y.o. male with a hx of coronary artery disease status post remote CABG in 2000, moderate aortic stenosis, peripheral arterial disease, and stage IV/V chronic kidney disease.  Other comorbid conditions include type 2 diabetes, hypertension, and mixed hyperlipidemia.  The patient underwent  AV fistula surgery in January 2020 in anticipation of requiring dialysis in the future.  Postoperatively he developed acute diastolic heart failure and non-STEMI following hospital discharge.  He ultimately underwent cardiac catheterization demonstrating continued patency of the LIMA to LAD graft and the saphenous vein graft to diagonal branch.  The saphenous vein graft to the RCA and first obtuse marginal were chronically occluded and there were collaterals to the distal RCA circulation.  Ongoing medical therapy was recommended.  The patient's LVEF at that time was in the range of 50 to 55%.  Past Medical History:  Diagnosis Date   Anemia    CAD of autologous bypass graft    Carotid artery disease (HCC)    Diabetes mellitus without complication (HCC)    DJD (degenerative joint disease)    History of anemia of chronic disease    Hypercholesteremia    Hypertension    NSTEMI (non-ST elevated myocardial infarction) ([place])    PVD (peripheral vascular disease) with claudication ([place])    Renal insufficiency    Wears dentures    Wears glasses     Past Surgical History:  Procedure Laterality Date   A/V FISTULAGRAM N/A 07/15/2018   Procedure: A/V FISTULAGRAM - Left Arm;  Surgeon: Serafina Mitchell, MD;  Location: [facility];  Service: Cardiovascular;  Laterality: N/A;   AV FISTULA PLACEMENT Left 03/20/2018   Procedure: ARTERIOVENOUS (AV) FISTULA CREATION LEFT ARM;  Surgeon: Serafina Mitchell, MD;  Location: [facility];  Service: Vascular;  Laterality: Left;   cataract surgery     COLONOSCOPY     Hx: of   CORONARY ARTERY BYPASS GRAFT  2000   by Dr. Cyndia Bent   LEFT HEART CATH AND CORS/GRAFTS ANGIOGRAPHY N/A 03/27/2018   Procedure: LEFT HEART CATH AND CORS/GRAFTS ANGIOGRAPHY;  Surgeon: Sherren Mocha, MD;  Location: [facility];  Service: Cardiovascular;  Laterality: N/A;   LUMBAR LAMINECTOMY/DECOMPRESSION MICRODISCECTOMY N/A 09/10/2012   Procedure: LUMBAR DECOMPRESSION,  IN SITU FUSION LUMBAR 4-5;  Surgeon: Melina Schools, MD;  Location: [facility];  Service: Orthopedics;  Laterality: N/A;   MULTIPLE TOOTH EXTRACTIONS     TONSILLECTOMY     UPPER EXTREMITY VENOGRAPHY Left 07/15/2018   Procedure: UPPER EXTREMITY VENOGRAPHY;  Surgeon: Serafina Mitchell, MD;  Location: [facility];  Service: Cardiovascular;  Laterality: Left;  UPPER ARM    Current Medications: Current Meds  Medication Sig   amLODipine (NORVASC) 10 MG tablet Take 1 tablet (10 mg total) by mouth every evening.   aspirin EC 81 MG tablet Take 81 mg by mouth daily.    atorvastatin (LIPITOR) 40 MG tablet Take 1 tablet (40 mg total) by mouth every evening.   b complex-vitamin c-folic acid (NEPHRO-VITE) 0.8 MG TABS tablet Take 1 tablet by  mouth at bedtime.   calcitRIOL (ROCALTROL) 0.25 MCG capsule Take 0.25 mcg by mouth once a week.    calcium acetate (PHOSLO) 667 MG capsule Take 667 mg by mouth 2 (two) times daily with a meal.   clopidogrel (PLAVIX) 75 MG tablet Take 1 tablet (75 mg total) by mouth daily.   Darbepoetin Alfa (ARANESP) 100 MCG/0.5ML SOSY injection Inject 100 mcg into the skin every 30 (thirty) days.    docusate sodium (COLACE) 100 MG capsule Take 1 capsule (100 mg total) by mouth every 12 (twelve) hours.   ferrous  gluconate (FERGON) 325 MG tablet Take 325 mg by mouth daily.    furosemide (LASIX) 40 MG tablet Take 40 mg by mouth daily.   glucose blood (ACCU-CHEK AVIVA PLUS) test strip CHECK BLOOD SUGAR TWICE A DAY. E11.9   hydrALAZINE (APRESOLINE) 25 MG tablet Take 1 tablet (25 mg total) by mouth 2 (two) times daily.   Lancets (ACCU-CHEK MULTICLIX) lancets USE TWICE DAILY.   levothyroxine (SYNTHROID) 50 MCG tablet Take 50 mcg by mouth daily before breakfast.   Multiple Vitamins-Minerals (PRESERVISION AREDS 2 PO) Take 1 tablet by mouth daily.   simethicone (GAS-X) 80 MG chewable tablet Chew 1 tablet (80 mg total) by mouth every 6 (six) hours as needed for flatulence.   traMADol (ULTRAM) 50 MG tablet Take 50 mg by mouth every 6 (six) hours as needed for moderate pain.   [DISCONTINUED] traMADol (ULTRAM) 50 MG tablet Take 1 tablet (50 mg total) by mouth every 6 (six) hours as needed. (Patient taking differently: Take 50 mg by mouth every 6 (six) hours as needed for moderate pain. )     Allergies:   Patient has no known allergies.   Social History   Socioeconomic History   Marital status: Married    Spouse name: [name]   Number of children: 5   Years of education: Not on file   Highest education level: Not on file  Occupational History   Occupation: retired    Fish farm manager: RETIRED  Scientist, product/process development strain: Not on file   Food insecurity    Worry: Not on file    Inability: Not on file   Transportation needs    Medical: Not on file    Non-medical: Not on file  Tobacco Use   Smoking status: Former Smoker    Packs/day: 1.00    Years: 10.00    Pack years: 10.00    Types: Cigarettes    Quit date: 02/26/1953    Years since quitting: 65.8   Smokeless tobacco: Never Used  Substance and Sexual Activity   Alcohol use: Not Currently    Alcohol/week: 0.0 standard drinks   Drug use: No   Sexual activity: Not on file  Lifestyle   Physical activity    Days per  week: Not on file    Minutes per session: Not on file   Stress: Not on file  Relationships   Social connections    Talks on phone: Not on file    Gets together: Not on file    Attends religious service: Not on file    Active member of club or organization: Not on file    Attends meetings of clubs or organizations: Not on file    Relationship status: Not on file  Other Topics Concern   Not on file  Social History Narrative   Not on file     Family History: The patient's family history includes Heart disease  in his father and mother.  ROS:   Please see the history of present illness.    All other systems reviewed and are negative.  EKGs/Labs/Other Studies Reviewed:    The following studies were reviewed today: Echo 03/26/2018: IMPRESSIONS    1. The left ventricle appears to be normal in size, has normal wall thickness 50-55% ejection fraction Spectral Doppler shows pseudonormal pattern of diastolic filling.  2. Probable basal inferolateral and lateral hypokinesis.  3. Right ventricular systolic pressure is is normal.  4. The right ventricle has normal size and normal systolic function.  5. Normal left atrial size.  6. Normal right atrial size.  7. Mitral valve regurgitation is mild to moderate by color flow Doppler.  8. Normal tricuspid valve.  9. Aortic valve regurgitation is mild by color flow Doppler. 10. Moderate stenosis of the aortic valve. 11. There is little change in the aortic stenosis severity, but there appears to be a new limited area of hypokinesis in the left circumflex artery distribution. 12. No atrial level shunt detected by color flow Doppler. 13. Aortic valve tricuspid. 14. The mitral valve is degenerative.  FINDINGS  Left Ventricle: The left ventricle appears to be normal in size, has normal wall thickness low normal systolic function with a 38-25% ejection fraction Spectral Doppler shows pseudonormal pattern of diastolic filling. There is  probable mild hypokinesis  of the basilar lateral and inferolateral left ventricular segments. Right Ventricle: The right ventricle is normal in size normal wall thickness has normal systolic function. Right ventricular systolic pressure is is normal with an estimated pressure of 29.6 mmHg. Left Atrium: The left atrium is normal in size. Right Atrium: The right atrial size is normal in size. Interatrial Septum: No atrial level shunt detected by color flow Doppler.  Pericardium: There is no evidence of pericardial effusion. Mitral Valve: The mitral valve is degenerative in appearance. There is mild thickening of the mitral valve. No evidence of mitral valve stenosis. Mitral valve regurgitation is mild to moderate by color flow Doppler. The MR jet is centrally-directed.  Diastolic mitral regurgitation is present. Tricuspid Valve: The tricuspid valve is normal in structure. Tricuspid regurgitation is trivial by color flow Doppler. Aortic Valve: The aortic valve tricuspid. There is moderate thickening of the aortic valve. There is moderate calcification of the aortic valve. The calculated aortic valve area is 0.93 cm, consistent with moderate stenosis stenosis. Aortic valve  regurgitation is mild by color flow Doppler with a pressure half time of 373 msec. Pulmonic Valve: The pulmonic valve was not well visualized. The pulmonic valve is grossly normal. Pulmonic valve regurgitation is not visualized by color flow Doppler. No evidence of pulmonic stenosis. In comparison to the previous echocardiogram(s): Prior examinations were reviewed in a side by side comparison of images. There is little change in the aortic stenosis severity, but there appears to be a new limited area of hypokinesis in the left  circumflex artery distribution.   LEFT VENTRICLE PLAX 2D ([place])              Biplane EF (MOD) LV EF:          38.4 %       LV Biplane EF: LVIDd:          4.84 cm      LV A4C EF:       46.9 % LVIDs:           3.94 cm      LV A2C EF:  53.0 % LV PW:          0.60 cm LV IVS:         0.50 cm      Diastology LVOT diam:      2.20 cm      LV e' lateral:   0.1 m/s LV SV:          42 ml        LV E/e' lateral: 20.7 LVOT Area:      3.80 cm     LV e' medial:    0.0 m/s                              LV E/e' medial:  38.7 LV Volumes (MOD) LV area d, A2C:    28.10 cm LV area d, A4C:    35.20 cm LV area s, A2C:    21.60 cm LV area s, A4C:    24.40 cm LV major d, A2C:   7.78 cm LV major d, A4C:   8.32 cm LV major s, A2C:   6.41 cm LV major s, A4C:   6.89 cm LV vol d, MOD A2C: 115.0 ml LV vol d, MOD A4C: 145.0 ml LV vol s, MOD A2C: 54.0 ml LV vol s, MOD A4C: 77.0 ml  RIGHT VENTRICLE TAPSE (M-mode): 1.5 cm RVSP:           29.6 mmHg  LEFT ATRIUM           Index       RIGHT ATRIUM           Index LA diam:      3.60 cm 1.99 cm/m  RA Pressure: 3 mmHg LA Vol (A2C): 53.1 ml 29.29 ml/m RA Area:     15.30 cm LA Vol (A4C): 39.6 ml 21.84 ml/m RA Volume:   34.30 ml  18.92 ml/m  AORTIC VALVE AV Area (Vmax):    1.00 cm AV Area (Vmean):   1.06 cm AV Area (VTI):     0.93 cm AV Vmax:           3.03 m/s AV Vmean:          2.035 m/s AV VTI:            0.675 m AV Peak Grad:      36.7 mmHg AV Mean Grad:      19.5 mmHg LVOT Vmax:         0.80 m/s LVOT Vmean:        0.566 m/s LVOT VTI:          0.165 m LVOT/AV VTI ratio: 0.24 AR PHT:            373 msec   AORTA Ao Root diam: 3.10 cm  MITRAL VALVE              TR Peak grad: 26.6 mmHg MV Area (PHT): 3.65 cm   TR Vmax:      2.58 m/s MV PHT:        60.32 msec RAP:          3 mmHg MV Decel Time: 208 msec   RVSP:         29.6 mmHg MR Peak grad: 136.4 mmHg MR Mean grad: 79.0 mmHg MR Vmax:      5.84 m/s MR Vmean:     4.1 m/s MV E velocity: 1.16 m/s MV A velocity: 1.27 m/s MV  E/A ratio:  0.91  EKG:  EKG is not ordered today.    Recent Labs: 01/01/2018: TSH 4.34 03/25/2018: B Natriuretic Peptide 669.1 03/31/2018: Magnesium  2.1 08/16/2018: ALT 29; BUN 54; Creatinine, Ser 2.81; Hemoglobin 9.7; Platelets 198; Potassium 3.8; Sodium 136  Recent Lipid Panel    Component Value Date/Time   CHOL 122 07/09/2018 1548   TRIG 86 07/09/2018 1548   HDL 47 07/09/2018 1548   CHOLHDL 2.6 07/09/2018 1548   CHOLHDL 4 01/22/2017 1155   VLDL 22.0 01/22/2017 1155   LDLCALC 58 07/09/2018 1548   LDLDIRECT 96.2 04/04/2007 1049    Physical Exam:    VS:  BP (!) 142/68    Pulse (!) 48    Ht 5\' 9"  (1.753 m)    Wt 148 lb 12.8 oz (67.5 kg)    SpO2 99%    BMI 21.97 kg/m     Wt Readings from Last 3 Encounters:  12/15/18 148 lb 12.8 oz (67.5 kg)  08/16/18 139 lb 15.9 oz (63.5 kg)  08/13/18 140 lb (63.5 kg)     GEN: Well nourished, well developed pleasant elderly male in no acute distress HEENT: Normal NECK: No JVD; bilateral carotid bruits LYMPHATICS: No lymphadenopathy CARDIAC: RRR, 2/6 mid peaking harsh systolic murmur at the right upper sternal border RESPIRATORY:  Clear to auscultation without rales, wheezing or rhonchi  ABDOMEN: Soft, non-tender, non-distended MUSCULOSKELETAL: 1+ bilateral ankle and pedal edema; No deformity  SKIN: Warm and dry NEUROLOGIC:  Alert and oriented x 3 PSYCHIATRIC:  Normal affect   ASSESSMENT:    1. Aortic valve stenosis, unspecified etiology   2. Coronary artery disease involving native coronary artery of native heart without angina pectoris   3. Chronic diastolic CHF (congestive heart failure) (HCC)    PLAN:    In order of problems listed above:  1. The patient's most recent echocardiogram from January 2020 is reviewed when his LV function was noted to be in the low normal range with an LVEF of 50 to 55% and his mean transaortic gradient was 20 mmHg.  We will continue close observation with a repeat echocardiogram in 6 months prior to his next office visit.  Discussed cardinal signs of aortic stenosis with the patient.  Discussed natural history of aortic stenosis with the patient  today. 2. The patient is having no anginal symptoms.  He remains on antiplatelet therapy with aspirin and a high intensity statin drug. 3. The patient has increasing edema and he has gained weight.  He has backed off on his furosemide because of frequent urination.  I have advised him to start back on furosemide 40 mg daily.  If he does not appreciate significant improvement he was asked to call back and we can make further adjustments to his diuretic regimen.   Medication Adjustments/Labs and Tests Ordered: Current medicines are reviewed at length with the patient today.  Concerns regarding medicines are outlined above.  Orders Placed This Encounter  Procedures   ECHOCARDIOGRAM COMPLETE   No orders of the defined types were placed in this encounter.   Patient Instructions  Medication Instructions:  1) Take your Lasix once DAILY *If you need a refill on your cardiac medications before your next appointment, please call your pharmacy*  Testing/Procedures: Your provider has requested that you have an echocardiogram in 6 months. Echocardiography is a painless test that uses sound waves to create images of your heart. It provides your doctor with information about the size and shape of your heart  and how well your hearts chambers and valves are working. This procedure takes approximately one hour. There are no restrictions for this procedure.  Follow-Up: You will be called to arrange your 6 mo echo and office visit with Dr. Burt Knack when his April schedule is available.     Signed, Sherren Mocha, MD  12/15/2018 1:42 PM    [facility]

## 2018-12-15 NOTE — Patient Instructions (Signed)
Medication Instructions:  1) Take your Lasix once DAILY *If you need a refill on your cardiac medications before your next appointment, please call your pharmacy*  Testing/Procedures: Your provider has requested that you have an echocardiogram in 6 months. Echocardiography is a painless test that uses sound waves to create images of your heart. It provides your doctor with information about the size and shape of your heart and how well your heart's chambers and valves are working. This procedure takes approximately one hour. There are no restrictions for this procedure.  Follow-Up: You will be called to arrange your 6 mo echo and office visit with Dr. Burt Knack when his April schedule is available.

## 2018-12-22 DIAGNOSIS — N189 Chronic kidney disease, unspecified: Secondary | ICD-10-CM | POA: Diagnosis not present

## 2018-12-22 DIAGNOSIS — E1122 Type 2 diabetes mellitus with diabetic chronic kidney disease: Secondary | ICD-10-CM | POA: Diagnosis not present

## 2018-12-22 DIAGNOSIS — I770 Arteriovenous fistula, acquired: Secondary | ICD-10-CM | POA: Diagnosis not present

## 2018-12-22 DIAGNOSIS — N179 Acute kidney failure, unspecified: Secondary | ICD-10-CM | POA: Diagnosis not present

## 2018-12-22 DIAGNOSIS — R809 Proteinuria, unspecified: Secondary | ICD-10-CM | POA: Diagnosis not present

## 2018-12-22 DIAGNOSIS — D631 Anemia in chronic kidney disease: Secondary | ICD-10-CM | POA: Diagnosis not present

## 2018-12-22 DIAGNOSIS — I214 Non-ST elevation (NSTEMI) myocardial infarction: Secondary | ICD-10-CM | POA: Diagnosis not present

## 2018-12-22 DIAGNOSIS — N2581 Secondary hyperparathyroidism of renal origin: Secondary | ICD-10-CM | POA: Diagnosis not present

## 2018-12-22 DIAGNOSIS — Z1159 Encounter for screening for other viral diseases: Secondary | ICD-10-CM | POA: Diagnosis not present

## 2018-12-22 DIAGNOSIS — I129 Hypertensive chronic kidney disease with stage 1 through stage 4 chronic kidney disease, or unspecified chronic kidney disease: Secondary | ICD-10-CM | POA: Diagnosis not present

## 2018-12-22 DIAGNOSIS — N184 Chronic kidney disease, stage 4 (severe): Secondary | ICD-10-CM | POA: Diagnosis not present

## 2018-12-22 DIAGNOSIS — I739 Peripheral vascular disease, unspecified: Secondary | ICD-10-CM | POA: Diagnosis not present

## 2019-01-08 DIAGNOSIS — H353233 Exudative age-related macular degeneration, bilateral, with inactive scar: Secondary | ICD-10-CM | POA: Diagnosis not present

## 2019-02-09 DIAGNOSIS — R809 Proteinuria, unspecified: Secondary | ICD-10-CM | POA: Diagnosis not present

## 2019-02-09 DIAGNOSIS — I129 Hypertensive chronic kidney disease with stage 1 through stage 4 chronic kidney disease, or unspecified chronic kidney disease: Secondary | ICD-10-CM | POA: Diagnosis not present

## 2019-02-09 DIAGNOSIS — N2581 Secondary hyperparathyroidism of renal origin: Secondary | ICD-10-CM | POA: Diagnosis not present

## 2019-02-09 DIAGNOSIS — D631 Anemia in chronic kidney disease: Secondary | ICD-10-CM | POA: Diagnosis not present

## 2019-02-09 DIAGNOSIS — N184 Chronic kidney disease, stage 4 (severe): Secondary | ICD-10-CM | POA: Diagnosis not present

## 2019-02-09 DIAGNOSIS — N179 Acute kidney failure, unspecified: Secondary | ICD-10-CM | POA: Diagnosis not present

## 2019-02-09 DIAGNOSIS — N189 Chronic kidney disease, unspecified: Secondary | ICD-10-CM | POA: Diagnosis not present

## 2019-02-09 DIAGNOSIS — E1122 Type 2 diabetes mellitus with diabetic chronic kidney disease: Secondary | ICD-10-CM | POA: Diagnosis not present

## 2019-02-09 DIAGNOSIS — I214 Non-ST elevation (NSTEMI) myocardial infarction: Secondary | ICD-10-CM | POA: Diagnosis not present

## 2019-02-09 DIAGNOSIS — I739 Peripheral vascular disease, unspecified: Secondary | ICD-10-CM | POA: Diagnosis not present

## 2019-02-09 DIAGNOSIS — I770 Arteriovenous fistula, acquired: Secondary | ICD-10-CM | POA: Diagnosis not present

## 2019-02-12 ENCOUNTER — Ambulatory Visit (INDEPENDENT_AMBULATORY_CARE_PROVIDER_SITE_OTHER): Payer: Medicare Other | Admitting: Internal Medicine

## 2019-02-12 ENCOUNTER — Other Ambulatory Visit: Payer: Self-pay

## 2019-02-12 ENCOUNTER — Encounter: Payer: Self-pay | Admitting: Internal Medicine

## 2019-02-12 ENCOUNTER — Other Ambulatory Visit (INDEPENDENT_AMBULATORY_CARE_PROVIDER_SITE_OTHER): Payer: Medicare Other

## 2019-02-12 VITALS — BP 124/70 | HR 76 | Temp 97.9°F | Ht 69.0 in | Wt 150.0 lb

## 2019-02-12 DIAGNOSIS — E611 Iron deficiency: Secondary | ICD-10-CM

## 2019-02-12 DIAGNOSIS — E538 Deficiency of other specified B group vitamins: Secondary | ICD-10-CM | POA: Diagnosis not present

## 2019-02-12 DIAGNOSIS — D638 Anemia in other chronic diseases classified elsewhere: Secondary | ICD-10-CM | POA: Diagnosis not present

## 2019-02-12 DIAGNOSIS — I2510 Atherosclerotic heart disease of native coronary artery without angina pectoris: Secondary | ICD-10-CM | POA: Diagnosis not present

## 2019-02-12 DIAGNOSIS — E559 Vitamin D deficiency, unspecified: Secondary | ICD-10-CM

## 2019-02-12 DIAGNOSIS — E119 Type 2 diabetes mellitus without complications: Secondary | ICD-10-CM

## 2019-02-12 DIAGNOSIS — E039 Hypothyroidism, unspecified: Secondary | ICD-10-CM | POA: Diagnosis not present

## 2019-02-12 DIAGNOSIS — I10 Essential (primary) hypertension: Secondary | ICD-10-CM

## 2019-02-12 DIAGNOSIS — I5032 Chronic diastolic (congestive) heart failure: Secondary | ICD-10-CM

## 2019-02-12 DIAGNOSIS — N185 Chronic kidney disease, stage 5: Secondary | ICD-10-CM

## 2019-02-12 LAB — CBC WITH DIFFERENTIAL/PLATELET
Basophils Absolute: 0.1 10*3/uL (ref 0.0–0.1)
Basophils Relative: 0.8 % (ref 0.0–3.0)
Eosinophils Absolute: 0.2 10*3/uL (ref 0.0–0.7)
Eosinophils Relative: 2.2 % (ref 0.0–5.0)
HCT: 29.4 % — ABNORMAL LOW (ref 39.0–52.0)
Hemoglobin: 9.8 g/dL — ABNORMAL LOW (ref 13.0–17.0)
Lymphocytes Relative: 12.9 % (ref 12.0–46.0)
Lymphs Abs: 1 10*3/uL (ref 0.7–4.0)
MCHC: 33.4 g/dL (ref 30.0–36.0)
MCV: 92 fl (ref 78.0–100.0)
Monocytes Absolute: 1.2 10*3/uL — ABNORMAL HIGH (ref 0.1–1.0)
Monocytes Relative: 15.7 % — ABNORMAL HIGH (ref 3.0–12.0)
Neutro Abs: 5.3 10*3/uL (ref 1.4–7.7)
Neutrophils Relative %: 68.4 % (ref 43.0–77.0)
Platelets: 255 10*3/uL (ref 150.0–400.0)
RBC: 3.2 Mil/uL — ABNORMAL LOW (ref 4.22–5.81)
RDW: 16.2 % — ABNORMAL HIGH (ref 11.5–15.5)
WBC: 7.7 10*3/uL (ref 4.0–10.5)

## 2019-02-12 LAB — MICROALBUMIN / CREATININE URINE RATIO
Creatinine,U: 29.8 mg/dL
Microalb Creat Ratio: 155.3 mg/g — ABNORMAL HIGH (ref 0.0–30.0)
Microalb, Ur: 46.3 mg/dL — ABNORMAL HIGH (ref 0.0–1.9)

## 2019-02-12 LAB — HEPATIC FUNCTION PANEL
ALT: 14 U/L (ref 0–53)
AST: 19 U/L (ref 0–37)
Albumin: 4.2 g/dL (ref 3.5–5.2)
Alkaline Phosphatase: 88 U/L (ref 39–117)
Bilirubin, Direct: 0.1 mg/dL (ref 0.0–0.3)
Total Bilirubin: 0.6 mg/dL (ref 0.2–1.2)
Total Protein: 7.3 g/dL (ref 6.0–8.3)

## 2019-02-12 LAB — URINALYSIS, ROUTINE W REFLEX MICROSCOPIC
Bilirubin Urine: NEGATIVE
Hgb urine dipstick: NEGATIVE
Ketones, ur: NEGATIVE
Leukocytes,Ua: NEGATIVE
Nitrite: NEGATIVE
RBC / HPF: NONE SEEN (ref 0–?)
Specific Gravity, Urine: 1.015 (ref 1.000–1.030)
Total Protein, Urine: 100 — AB
Urine Glucose: NEGATIVE
Urobilinogen, UA: 0.2 (ref 0.0–1.0)
WBC, UA: NONE SEEN (ref 0–?)
pH: 6.5 (ref 5.0–8.0)

## 2019-02-12 LAB — IBC PANEL
Iron: 36 ug/dL — ABNORMAL LOW (ref 42–165)
Saturation Ratios: 14.6 % — ABNORMAL LOW (ref 20.0–50.0)
Transferrin: 176 mg/dL — ABNORMAL LOW (ref 212.0–360.0)

## 2019-02-12 LAB — BASIC METABOLIC PANEL
BUN: 44 mg/dL — ABNORMAL HIGH (ref 6–23)
CO2: 26 mEq/L (ref 19–32)
Calcium: 9.5 mg/dL (ref 8.4–10.5)
Chloride: 103 mEq/L (ref 96–112)
Creatinine, Ser: 2.62 mg/dL — ABNORMAL HIGH (ref 0.40–1.50)
GFR: 22.96 mL/min — ABNORMAL LOW (ref >60.00)
Glucose, Bld: 100 mg/dL — ABNORMAL HIGH (ref 70–99)
Potassium: 4.1 mEq/L (ref 3.5–5.1)
Sodium: 136 mEq/L (ref 135–145)

## 2019-02-12 LAB — FERRITIN: Ferritin: 360.4 ng/mL — ABNORMAL HIGH (ref 22.0–322.0)

## 2019-02-12 LAB — LIPID PANEL
Cholesterol: 128 mg/dL (ref 0–200)
HDL: 52.2 mg/dL (ref >39.00)
LDL Cholesterol: 60 mg/dL (ref 0–99)
NonHDL: 76.24
Total CHOL/HDL Ratio: 2
Triglycerides: 83 mg/dL (ref 0.0–149.0)
VLDL: 16.6 mg/dL (ref 0.0–40.0)

## 2019-02-12 LAB — HEMOGLOBIN A1C: Hgb A1c MFr Bld: 5.7 % (ref 4.6–6.5)

## 2019-02-12 LAB — VITAMIN B12: Vitamin B-12: 408 pg/mL (ref 211–911)

## 2019-02-12 LAB — TSH: TSH: 3.49 u[IU]/mL (ref 0.35–4.50)

## 2019-02-12 LAB — VITAMIN D 25 HYDROXY (VIT D DEFICIENCY, FRACTURES): VITD: 39.02 ng/mL (ref 30.00–100.00)

## 2019-02-12 NOTE — Progress Notes (Signed)
Subjective:    Patient ID: Henry [name], male    DOB: [birth year], 85 y.o.   MRN: [id number]  HPI  Here to f/u; overall doing ok,  Pt denies chest pain, increasing sob or doe, wheezing, orthopnea, PND, increased LE swelling, palpitations, dizziness or syncope.  Pt denies new neurological symptoms such as new headache, or facial or extremity weakness or numbness.  Pt denies polydipsia, polyuria, or low sugar episode.  Pt states overall good compliance with meds, mostly trying to follow appropriate diet, with wt overall stable,  but little exercise however  Gained wt with better appetite.   Wt Readings from Last 3 Encounters:  02/12/19 150 lb (68 kg)  12/15/18 148 lb 12.8 oz (67.5 kg)  08/16/18 139 lb 15.9 oz (63.5 kg)  No overt bleeding or bruising.  Has overall worsening balance but no falls, declines need for cane  Denies hyper or hypo thyroid symptoms such as voice, skin or hair change.   Past Medical History:  Diagnosis Date  . Anemia   . CAD of autologous bypass graft   . Carotid artery disease ([place])   . Diabetes mellitus without complication ([place])   . DJD (degenerative joint disease)   . History of anemia of chronic disease   . Hypercholesteremia   . Hypertension   . NSTEMI (non-ST elevated myocardial infarction) ([place])   . PVD (peripheral vascular disease) with claudication ([place])   . Renal insufficiency   . Wears dentures   . Wears glasses    Past Surgical History:  Procedure Laterality Date  . A/V FISTULAGRAM N/A 07/15/2018   Procedure: A/V FISTULAGRAM - Left Arm;  Surgeon: Serafina Mitchell, MD;  Location: [facility];  Service: Cardiovascular;  Laterality: N/A;  . AV FISTULA PLACEMENT Left 03/20/2018   Procedure: ARTERIOVENOUS (AV) FISTULA CREATION LEFT ARM;  Surgeon: Serafina Mitchell, MD;  Location: [facility];  Service: Vascular;  Laterality: Left;  . cataract surgery    . COLONOSCOPY     Hx: of  . CORONARY ARTERY BYPASS GRAFT  2000   by Dr. Cyndia Bent  . LEFT HEART CATH  AND CORS/GRAFTS ANGIOGRAPHY N/A 03/27/2018   Procedure: LEFT HEART CATH AND CORS/GRAFTS ANGIOGRAPHY;  Surgeon: Sherren Mocha, MD;  Location: [facility];  Service: Cardiovascular;  Laterality: N/A;  . LUMBAR LAMINECTOMY/DECOMPRESSION MICRODISCECTOMY N/A 09/10/2012   Procedure: LUMBAR DECOMPRESSION,  IN SITU FUSION LUMBAR 4-5;  Surgeon: Melina Schools, MD;  Location: [facility];  Service: Orthopedics;  Laterality: N/A;  . MULTIPLE TOOTH EXTRACTIONS    . TONSILLECTOMY    . UPPER EXTREMITY VENOGRAPHY Left 07/15/2018   Procedure: UPPER EXTREMITY VENOGRAPHY;  Surgeon: Serafina Mitchell, MD;  Location: [facility];  Service: Cardiovascular;  Laterality: Left;  UPPER ARM    reports that he quit smoking about 66 years ago. His smoking use included cigarettes. He has a 10.00 pack-year smoking history. He has never used smokeless tobacco. He reports previous alcohol use. He reports that he does not use drugs. family history includes Heart disease in his father and mother. No Known Allergies Current Outpatient Medications on File Prior to Visit  Medication Sig Dispense Refill  . amLODipine (NORVASC) 10 MG tablet Take 1 tablet (10 mg total) by mouth every evening. 90 tablet 3  . aspirin EC 81 MG tablet Take 81 mg by mouth daily.     Marland Kitchen atorvastatin (LIPITOR) 40 MG tablet Take 1 tablet (40 mg total) by mouth every evening. [place]  tablet 0  . b complex-vitamin c-folic acid (NEPHRO-VITE) 0.8 MG TABS tablet Take 1 tablet by mouth at bedtime.    . calcitRIOL (ROCALTROL) 0.25 MCG capsule Take 0.25 mcg by mouth once a week.     . calcium acetate (PHOSLO) 667 MG capsule Take 667 mg by mouth 2 (two) times daily with a meal.    . clopidogrel (PLAVIX) 75 MG tablet Take 1 tablet (75 mg total) by mouth daily. 30 tablet 0  . Darbepoetin Alfa (ARANESP) 100 MCG/0.5ML SOSY injection Inject 100 mcg into the skin every 30 (thirty) days.     Marland Kitchen docusate sodium (COLACE) 100 MG capsule Take 1 capsule (100 mg total) by mouth  every 12 (twelve) hours. 60 capsule 0  . ferrous gluconate (FERGON) 325 MG tablet Take 325 mg by mouth daily.     . furosemide (LASIX) 40 MG tablet Take 40 mg by mouth daily.    Marland Kitchen glucose blood (ACCU-CHEK AVIVA PLUS) test strip CHECK BLOOD SUGAR TWICE A DAY. E11.9 50 each 11  . hydrALAZINE (APRESOLINE) 25 MG tablet Take 1 tablet (25 mg total) by mouth 2 (two) times daily. 180 tablet 3  . Lancets (ACCU-CHEK MULTICLIX) lancets USE TWICE DAILY. 102 each 5  . levothyroxine (SYNTHROID) 50 MCG tablet Take 50 mcg by mouth daily before breakfast.    . Multiple Vitamins-Minerals (PRESERVISION AREDS 2 PO) Take 1 tablet by mouth daily.    . simethicone (GAS-X) 80 MG chewable tablet Chew 1 tablet (80 mg total) by mouth every 6 (six) hours as needed for flatulence. 30 tablet 0  . traMADol (ULTRAM) 50 MG tablet Take 50 mg by mouth every 6 (six) hours as needed for moderate pain.     No current facility-administered medications on file prior to visit.   Review of Systems  Constitutional: Negative for other unusual diaphoresis or sweats HENT: Negative for ear discharge or swelling Eyes: Negative for other worsening visual disturbances Respiratory: Negative for stridor or other swelling  Gastrointestinal: Negative for worsening distension or other blood Genitourinary: Negative for retention or other urinary change Musculoskeletal: Negative for other MSK pain or swelling Skin: Negative for color change or other new lesions Neurological: Negative for worsening tremors and other numbness  Psychiatric/Behavioral: Negative for worsening agitation or other fatigue All otherwise neg per pt      Objective:   Physical Exam BP 124/70   Pulse 76   Temp 97.9 F (36.6 C) (Oral)   Ht 5\' 9"  (1.753 m)   Wt 150 lb (68 kg)   SpO2 96%   BMI 22.15 kg/m  VS noted,  Constitutional: Pt appears in NAD HENT: Head: NCAT.  Right Ear: External ear normal.  Left Ear: External ear normal.  Eyes: . Pupils are equal,  round, and reactive to light. Conjunctivae and EOM are normal Nose: without d/c or deformity Neck: Neck supple. Gross normal ROM Cardiovascular: Normal rate and regular rhythm.   Pulmonary/Chest: Effort normal and breath sounds without rales or wheezing.  Abd:  Soft, NT, ND, + BS, no organomegaly Neurological: Pt is alert. At baseline orientation, motor grossly intact Skin: Skin is warm. No rashes, other new lesions, no LE edema Psychiatric: Pt behavior is normal without agitation  All otherwise neg per pt Lab Results  Component Value Date   WBC 7.7 02/12/2019   HGB 9.8 (L) 02/12/2019   HCT 29.4 (L) 02/12/2019   PLT 255.0 02/12/2019   GLUCOSE 100 (H) 02/12/2019   CHOL 128 02/12/2019  TRIG 83.0 02/12/2019   HDL 52.20 02/12/2019   LDLDIRECT 96.2 04/04/2007   LDLCALC 60 02/12/2019   ALT 14 02/12/2019   AST 19 02/12/2019   NA 136 02/12/2019   K 4.1 02/12/2019   CL 103 02/12/2019   CREATININE 2.62 (H) 02/12/2019   BUN 44 (H) 02/12/2019   CO2 26 02/12/2019   TSH 3.49 02/12/2019   PSA 1.47 01/03/2010   INR 1.13 03/25/2018   HGBA1C 5.7 02/12/2019   MICROALBUR 46.3 (H) 02/12/2019      Assessment & Plan:

## 2019-02-12 NOTE — Assessment & Plan Note (Signed)
stable overall by history and exam, recent data reviewed with pt, and pt to continue medical treatment as before,  to f/u any worsening symptoms or concerns  

## 2019-02-12 NOTE — Patient Instructions (Signed)

## 2019-02-12 NOTE — Assessment & Plan Note (Addendum)
stable overall by history and exam, recent data reviewed with pt, and pt to continue medical treatment as before,  to f/u any worsening symptoms or concerns, for lab f/u  Note:  Total time for pt hx, exam, review of record with pt in the room, determination of diagnoses and plan for further eval and tx is > 40 min, with over 50% spent in coordination and counseling of patient including the differential dx, tx, further evaluation and other management of anemia, DM, hypothyroidism, HTn, CKD, diast CHF, vit d deficiency

## 2019-02-12 NOTE — Assessment & Plan Note (Signed)
Improved, for f/u lab,  to f/u any worsening symptoms or concerns

## 2019-02-12 NOTE — Assessment & Plan Note (Signed)
stable overall by history and exam, recent data reviewed with pt, and pt to continue medical treatment as before,  to f/u any worsening symptoms or concerns, for f/u lab 

## 2019-02-18 ENCOUNTER — Other Ambulatory Visit (HOSPITAL_COMMUNITY): Payer: Self-pay | Admitting: *Deleted

## 2019-02-19 ENCOUNTER — Other Ambulatory Visit: Payer: Self-pay

## 2019-02-19 ENCOUNTER — Ambulatory Visit (HOSPITAL_COMMUNITY)
Admission: RE | Admit: 2019-02-19 | Discharge: 2019-02-19 | Disposition: A | Discharge status: Home / Self Care | Payer: Medicare Other | Source: Ambulatory Visit | Attending: Nephrology | Admitting: Nephrology

## 2019-02-19 DIAGNOSIS — N189 Chronic kidney disease, unspecified: Secondary | ICD-10-CM | POA: Insufficient documentation

## 2019-02-19 DIAGNOSIS — D631 Anemia in chronic kidney disease: Secondary | ICD-10-CM | POA: Insufficient documentation

## 2019-02-19 MED ORDER — SODIUM CHLORIDE 0.9 % IV SOLN
510.0000 mg | INTRAVENOUS | Status: DC
  Administered 2019-02-19: 510 mg via INTRAVENOUS
  Filled 2019-02-19: qty 17

## 2019-02-19 NOTE — Discharge Instructions (Signed)

## 2019-02-26 ENCOUNTER — Telehealth: Payer: Self-pay

## 2019-02-26 ENCOUNTER — Other Ambulatory Visit: Payer: Self-pay

## 2019-02-26 ENCOUNTER — Ambulatory Visit (HOSPITAL_COMMUNITY)
Admission: RE | Admit: 2019-02-26 | Discharge: 2019-02-26 | Disposition: A | Discharge status: Home / Self Care | Payer: Medicare Other | Source: Ambulatory Visit | Attending: Nephrology | Admitting: Nephrology

## 2019-02-26 DIAGNOSIS — D631 Anemia in chronic kidney disease: Secondary | ICD-10-CM | POA: Insufficient documentation

## 2019-02-26 MED ORDER — SODIUM CHLORIDE 0.9 % IV SOLN
510.0000 mg | INTRAVENOUS | Status: DC
  Administered 2019-02-26: 510 mg via INTRAVENOUS
  Filled 2019-02-26: qty 17

## 2019-02-26 NOTE — Telephone Encounter (Unsigned)
Copied from [place] [id number]. Topic: General - Other >> Feb 26, 2019 12:37 PM Rayann Heman wrote: Reason for CRM: pt called and stated that he would like to be on a list covid vaccine

## 2019-03-02 NOTE — Telephone Encounter (Signed)
Notified pt covid vaccine is not available at this office at this time but will notified pt when available.

## 2019-03-20 DIAGNOSIS — Z23 Encounter for immunization: Secondary | ICD-10-CM | POA: Diagnosis not present

## 2019-04-11 DIAGNOSIS — Z23 Encounter for immunization: Secondary | ICD-10-CM | POA: Diagnosis not present

## 2019-05-21 DIAGNOSIS — N179 Acute kidney failure, unspecified: Secondary | ICD-10-CM | POA: Diagnosis not present

## 2019-05-21 DIAGNOSIS — I739 Peripheral vascular disease, unspecified: Secondary | ICD-10-CM | POA: Diagnosis not present

## 2019-05-21 DIAGNOSIS — E1122 Type 2 diabetes mellitus with diabetic chronic kidney disease: Secondary | ICD-10-CM | POA: Diagnosis not present

## 2019-05-21 DIAGNOSIS — N189 Chronic kidney disease, unspecified: Secondary | ICD-10-CM | POA: Diagnosis not present

## 2019-05-21 DIAGNOSIS — D631 Anemia in chronic kidney disease: Secondary | ICD-10-CM | POA: Diagnosis not present

## 2019-05-21 DIAGNOSIS — N2581 Secondary hyperparathyroidism of renal origin: Secondary | ICD-10-CM | POA: Diagnosis not present

## 2019-05-21 DIAGNOSIS — N184 Chronic kidney disease, stage 4 (severe): Secondary | ICD-10-CM | POA: Diagnosis not present

## 2019-05-21 DIAGNOSIS — I129 Hypertensive chronic kidney disease with stage 1 through stage 4 chronic kidney disease, or unspecified chronic kidney disease: Secondary | ICD-10-CM | POA: Diagnosis not present

## 2019-05-21 DIAGNOSIS — E039 Hypothyroidism, unspecified: Secondary | ICD-10-CM | POA: Diagnosis not present

## 2019-05-21 DIAGNOSIS — I214 Non-ST elevation (NSTEMI) myocardial infarction: Secondary | ICD-10-CM | POA: Diagnosis not present

## 2019-05-21 DIAGNOSIS — I770 Arteriovenous fistula, acquired: Secondary | ICD-10-CM | POA: Diagnosis not present

## 2019-05-21 DIAGNOSIS — R809 Proteinuria, unspecified: Secondary | ICD-10-CM | POA: Diagnosis not present

## 2019-06-12 ENCOUNTER — Encounter: Payer: Self-pay | Admitting: Internal Medicine

## 2019-06-18 ENCOUNTER — Ambulatory Visit (HOSPITAL_COMMUNITY): Payer: Medicare Other | Attending: Cardiovascular Disease

## 2019-06-18 ENCOUNTER — Other Ambulatory Visit: Payer: Self-pay

## 2019-06-18 DIAGNOSIS — I350 Nonrheumatic aortic (valve) stenosis: Secondary | ICD-10-CM | POA: Insufficient documentation

## 2019-06-22 ENCOUNTER — Encounter: Payer: Self-pay | Admitting: Cardiovascular Disease

## 2019-06-22 ENCOUNTER — Other Ambulatory Visit: Payer: Self-pay

## 2019-06-22 ENCOUNTER — Ambulatory Visit (INDEPENDENT_AMBULATORY_CARE_PROVIDER_SITE_OTHER): Payer: Medicare Other | Admitting: Cardiovascular Disease

## 2019-06-22 VITALS — BP 158/60 | HR 74 | Ht 69.0 in | Wt 146.8 lb

## 2019-06-22 DIAGNOSIS — I10 Essential (primary) hypertension: Secondary | ICD-10-CM | POA: Diagnosis not present

## 2019-06-22 DIAGNOSIS — I350 Nonrheumatic aortic (valve) stenosis: Secondary | ICD-10-CM

## 2019-06-22 DIAGNOSIS — I2510 Atherosclerotic heart disease of native coronary artery without angina pectoris: Secondary | ICD-10-CM | POA: Diagnosis not present

## 2019-06-22 DIAGNOSIS — E782 Mixed hyperlipidemia: Secondary | ICD-10-CM | POA: Diagnosis not present

## 2019-06-22 NOTE — Progress Notes (Signed)
Cardiology Office Note:    Date:  06/22/2019   ID:  Henry [name], DOB September 27, 1924, MRN [id number]  PCP:  [name], MD  Cardiologist:  Sherren Mocha, MD  Electrophysiologist:  None   Referring MD: [name], MD   Chief Complaint  Patient presents with  . Coronary Artery Disease    History of Present Illness:    Henry [name] is a 86 y.o. male with a hx of coronary artery disease status post remote CABG in 2000, moderate aortic stenosis, peripheral arterial disease, and advanced age kidney disease.  Other comorbid conditions include type 2 diabetes, essential hypertension, and mixed hyperlipidemia.  The patient developed a postoperative non-STEMI when he underwent AV fistula surgery in January 9563 complicated by acute diastolic heart failure.  He underwent cardiac catheterization demonstrating patency of the LIMA to LAD graft and saphenous vein graft to diagonal.  The saphenous vein graft to the RCA and a obtuse marginal branches were occluded.  Medical therapy was recommended.  The patient is here alone today.  When he was seen last 6 months ago he had increased leg swelling.  He had discontinued furosemide at that time because of frequent urination.  He was started back on a reduced dose of furosemide 40 mg once daily.  He has done better with this and notes improvement in his leg swelling.  He denies shortness of breath, orthopnea, or PND.  He denies any recent chest pain or pressure.  No lightheadedness or syncope.  He has regular follow-up with nephrology and he thinks he was last seen in the last 1 to 2 months.  Past Medical History:  Diagnosis Date  . Anemia   . CAD of autologous bypass graft   . Carotid artery disease ([place])   . Diabetes mellitus without complication ([place])   . DJD (degenerative joint disease)   . History of anemia of chronic disease   . Hypercholesteremia   . Hypertension   . NSTEMI (non-ST elevated myocardial infarction) ([place])   . PVD  (peripheral vascular disease) with claudication ([place])   . Renal insufficiency   . Wears dentures   . Wears glasses     Past Surgical History:  Procedure Laterality Date  . A/V FISTULAGRAM N/A 07/15/2018   Procedure: A/V FISTULAGRAM - Left Arm;  Surgeon: Serafina Mitchell, MD;  Location: [facility];  Service: Cardiovascular;  Laterality: N/A;  . AV FISTULA PLACEMENT Left 03/20/2018   Procedure: ARTERIOVENOUS (AV) FISTULA CREATION LEFT ARM;  Surgeon: Serafina Mitchell, MD;  Location: [facility];  Service: Vascular;  Laterality: Left;  . cataract surgery    . COLONOSCOPY     Hx: of  . CORONARY ARTERY BYPASS GRAFT  2000   by Dr. Cyndia Bent  . LEFT HEART CATH AND CORS/GRAFTS ANGIOGRAPHY N/A 03/27/2018   Procedure: LEFT HEART CATH AND CORS/GRAFTS ANGIOGRAPHY;  Surgeon: Sherren Mocha, MD;  Location: [facility];  Service: Cardiovascular;  Laterality: N/A;  . LUMBAR LAMINECTOMY/DECOMPRESSION MICRODISCECTOMY N/A 09/10/2012   Procedure: LUMBAR DECOMPRESSION,  IN SITU FUSION LUMBAR 4-5;  Surgeon: Melina Schools, MD;  Location: [facility];  Service: Orthopedics;  Laterality: N/A;  . MULTIPLE TOOTH EXTRACTIONS    . TONSILLECTOMY    . UPPER EXTREMITY VENOGRAPHY Left 07/15/2018   Procedure: UPPER EXTREMITY VENOGRAPHY;  Surgeon: Serafina Mitchell, MD;  Location: [facility];  Service: Cardiovascular;  Laterality: Left;  UPPER ARM    Current Medications: Current Meds  Medication Sig  .  amLODipine (NORVASC) 10 MG tablet Take 1 tablet (10 mg total) by mouth every evening.  Marland Kitchen aspirin EC 81 MG tablet Take 81 mg by mouth daily.   Marland Kitchen atorvastatin (LIPITOR) 40 MG tablet Take 1 tablet (40 mg total) by mouth every evening.  Marland Kitchen b complex-vitamin c-folic acid (NEPHRO-VITE) 0.8 MG TABS tablet Take 1 tablet by mouth at bedtime.  . calcitRIOL (ROCALTROL) 0.25 MCG capsule Take 0.25 mcg by mouth 2 (two) times a week.   . calcium acetate (PHOSLO) 667 MG capsule Take 667 mg by mouth 2 (two) times daily with a meal.    . clopidogrel (PLAVIX) 75 MG tablet Take 1 tablet (75 mg total) by mouth daily.  . Darbepoetin Alfa (ARANESP) 100 MCG/0.5ML SOSY injection Inject 100 mcg into the skin every 30 (thirty) days.   . ferrous gluconate (FERGON) 325 MG tablet Take 325 mg by mouth daily.   . furosemide (LASIX) 40 MG tablet Take 40 mg by mouth daily.  Marland Kitchen glucose blood (ACCU-CHEK AVIVA PLUS) test strip CHECK BLOOD SUGAR TWICE A DAY. E11.9  . hydrALAZINE (APRESOLINE) 25 MG tablet Take 1 tablet (25 mg total) by mouth 2 (two) times daily.  . Lancets (ACCU-CHEK MULTICLIX) lancets USE TWICE DAILY.  Marland Kitchen levothyroxine (SYNTHROID) 50 MCG tablet Take 50 mcg by mouth daily before breakfast.  . Multiple Vitamins-Minerals (PRESERVISION AREDS 2 PO) Take 1 tablet by mouth daily.  . traMADol (ULTRAM) 50 MG tablet Take 50 mg by mouth every 6 (six) hours as needed for moderate pain.     Allergies:   Patient has no known allergies.   Social History   Socioeconomic History  . Marital status: Married    Spouse name: [name]  . Number of children: 5  . Years of education: Not on file  . Highest education level: Not on file  Occupational History  . Occupation: retired    Fish farm manager: RETIRED  Tobacco Use  . Smoking status: Former Smoker    Packs/day: 1.00    Years: 10.00    Pack years: 10.00    Types: Cigarettes    Quit date: 02/26/1953    Years since quitting: 66.3  . Smokeless tobacco: Never Used  Substance and Sexual Activity  . Alcohol use: Not Currently    Alcohol/week: 0.0 standard drinks  . Drug use: No  . Sexual activity: Not on file  Other Topics Concern  . Not on file  Social History Narrative  . Not on file   Social Determinants of Health   Financial Resource Strain:   . Difficulty of Paying Living Expenses:   Food Insecurity:   . Worried About Charity fundraiser in the Last Year:   . Arboriculturist in the Last Year:   Transportation Needs:   . Film/video editor (Medical):   Marland Kitchen Lack of  Transportation (Non-Medical):   Physical Activity:   . Days of Exercise per Week:   . Minutes of Exercise per Session:   Stress:   . Feeling of Stress :   Social Connections:   . Frequency of Communication with Friends and Family:   . Frequency of Social Gatherings with Friends and Family:   . Attends Religious Services:   . Active Member of Clubs or Organizations:   . Attends Archivist Meetings:   Marland Kitchen Marital Status:      Family History: The patient's family history includes Heart disease in his father and mother.  ROS:   Please see the  history of present illness.    All other systems reviewed and are negative.  EKGs/Labs/Other Studies Reviewed:    The following studies were reviewed today: Echo 06/18/2019: IMPRESSIONS    1. Mid and basal inferior/inferior lateral wall hypokinesis . Left  ventricular ejection fraction, by estimation, is 50 to 55%. The left  ventricle has low normal function. The left ventricle demonstrates  regional wall motion abnormalities (see scoring  diagram/findings for description). The left ventricular internal cavity  size was mildly dilated. There is moderate left ventricular hypertrophy.  Left ventricular diastolic parameters are consistent with Grade II  diastolic dysfunction  (pseudonormalization). Elevated left ventricular end-diastolic pressure.  2. Right ventricular systolic function is normal. The right ventricular  size is normal. There is moderately elevated pulmonary artery systolic  pressure.  3. Left atrial size was moderately dilated.  4. The mitral valve is normal in structure. Mild mitral valve  regurgitation. No evidence of mitral stenosis.  5. No significant change in gradients since January 2020 . The aortic  valve is tricuspid. Aortic valve regurgitation is mild. Moderate aortic  valve stenosis.  6. The inferior vena cava is normal in size with greater than 50%  respiratory variability, suggesting right  atrial pressure of 3 mmHg.   FINDINGS  Left Ventricle: Mid and basal inferior/inferior lateral wall hypokinesis.  Left ventricular ejection fraction, by estimation, is 50 to 55%. The left  ventricle has low normal function. The left ventricle demonstrates  regional wall motion abnormalities.  The left ventricular internal cavity size was mildly dilated. There is  moderate left ventricular hypertrophy. Left ventricular diastolic  parameters are consistent with Grade II diastolic dysfunction  (pseudonormalization). Elevated left ventricular  end-diastolic pressure.   Right Ventricle: The right ventricular size is normal. No increase in  right ventricular wall thickness. Right ventricular systolic function is  normal. There is moderately elevated pulmonary artery systolic pressure.  The tricuspid regurgitant velocity is  2.96 m/s, and with an assumed right atrial pressure of 10 mmHg, the  estimated right ventricular systolic pressure is 51.7 mmHg.   Left Atrium: Left atrial size was moderately dilated.   Right Atrium: Right atrial size was normal in size.   Pericardium: There is no evidence of pericardial effusion.   Mitral Valve: The mitral valve is normal in structure. There is mild  thickening of the mitral valve leaflet(s). There is mild calcification of  the mitral valve leaflet(s). Normal mobility of the mitral valve leaflets.  Mild mitral annular calcification.  Mild mitral valve regurgitation. No evidence of mitral valve stenosis.   Tricuspid Valve: The tricuspid valve is normal in structure. Tricuspid  valve regurgitation is mild . No evidence of tricuspid stenosis.   Aortic Valve: No significant change in gradients since January 2020. The  aortic valve is tricuspid. . There is severe thickening and severe  calcifcation of the aortic valve. Aortic valve regurgitation is mild.  Aortic regurgitation PHT measures 357 msec.  Moderate aortic stenosis is present. There is  severe thickening of the  aortic valve. There is severe calcifcation of the aortic valve. Aortic  valve mean gradient measures 20.5 mmHg. Aortic valve peak gradient  measures 36.5 mmHg. Aortic valve area, by VTI  measures 1.02 cm.   Pulmonic Valve: The pulmonic valve was normal in structure. Pulmonic valve  regurgitation is not visualized. No evidence of pulmonic stenosis.   Aorta: The aortic root is normal in size and structure.   Venous: The inferior vena cava is normal in  size with greater than 50%  respiratory variability, suggesting right atrial pressure of 3 mmHg.   IAS/Shunts: No atrial level shunt detected by color flow Doppler.   Cardiac catheterization 03/27/2018: Conclusion    Ost RCA to Prox RCA lesion is 100% stenosed.  Origin to Prox Graft lesion is 100% stenosed.  Origin lesion is 100% stenosed.  Prox LAD lesion is 100% stenosed.  Ost 1st Mrg lesion is 90% stenosed.   1. Severe native 3 vessel disease with total occlusion of the RCA and LAD, severe stenosis of the left circumflex 2. S/P remote CABG with continued patency of the LIMA-LAD and SVG-diagonal, total occlusion of the SVG-RCA and SVG-PDA  Recommend medical management of CAD. No clear culprit lesion. Both SVG occlusions appear chronic. No targets for PCI.     EKG:  EKG is ordered today.  The ekg ordered today demonstrates normal sinus rhythm 74 bpm, first-degree AV block, age-indeterminate inferior MI.  Recent Labs: 02/12/2019: ALT 14; BUN 44; Creatinine, Ser 2.62; Hemoglobin 9.8; Platelets 255.0; Potassium 4.1; Sodium 136; TSH 3.49  Recent Lipid Panel    Component Value Date/Time   CHOL 128 02/12/2019 1215   CHOL 122 07/09/2018 1548   TRIG 83.0 02/12/2019 1215   HDL 52.20 02/12/2019 1215   HDL 47 07/09/2018 1548   CHOLHDL 2 02/12/2019 1215   VLDL 16.6 02/12/2019 1215   LDLCALC 60 02/12/2019 1215   LDLCALC 58 07/09/2018 1548   LDLDIRECT 96.2 04/04/2007 1049    Physical Exam:    VS:   BP (!) 158/60   Pulse 74   Ht 5\' 9"  (1.753 m)   Wt 146 lb 12.8 oz (66.6 kg)   BMI 21.68 kg/m     Wt Readings from Last 3 Encounters:  06/22/19 146 lb 12.8 oz (66.6 kg)  02/26/19 142 lb (64.4 kg)  02/19/19 145 lb (65.8 kg)     GEN:  Well nourished, well developed elderly male in no acute distress HEENT: Normal NECK: No JVD; No carotid bruits LYMPHATICS: No lymphadenopathy CARDIAC: RRR, 3/6 harsh late peaking systolic murmur at the RUSB RESPIRATORY:  Clear to auscultation without rales, wheezing or rhonchi  ABDOMEN: Soft, non-tender, non-distended MUSCULOSKELETAL:  No edema; No deformity  SKIN: Warm and dry NEUROLOGIC:  Alert and oriented x 3 PSYCHIATRIC:  Normal affect   ASSESSMENT:    1. Coronary artery disease involving native coronary artery of native heart without angina pectoris   2. Nonrheumatic aortic valve stenosis   3. Essential hypertension   4. Mixed hyperlipidemia    PLAN:    In order of problems listed above:  1. Continue aspirin and clopidogrel.  He elected to avoid repeat attempts at AV fistula surgery since his kidney function has been stable and he suffered a non-STEMI. 2. Moderate aortic stenosis by echo assessment.  His exam is more impressive and suggestive of more severe aortic stenosis.  He does not appear to have any symptoms and again echo assessment is stable with only a 21 mm mean transaortic gradient clearly consistent with moderate AS. 3. Blood pressure is controlled on amlodipine and hydralazine.  The patient has had marked bradycardia with beta-blockers. 4. Treated with a high intensity statin drug (atorvastatin 40 mg daily) last lipids from December 2020 showed cholesterol 128, HDL 52, LDL 60.  Medication Adjustments/Labs and Tests Ordered: Current medicines are reviewed at length with the patient today.  Concerns regarding medicines are outlined above.  Orders Placed This Encounter  Procedures  . EKG 12-Lead  No orders of the defined  types were placed in this encounter.   Patient Instructions  Medication Instructions:  Your provider recommends that you continue on your current medications as directed. Please refer to the Current Medication list given to you today.   *If you need a refill on your cardiac medications before your next appointment, please call your pharmacy*  Follow-Up: At [facility], you and your health needs are our priority.  As part of our continuing mission to provide you with exceptional heart care, we have created designated Provider Care Teams.  These Care Teams include your primary Cardiologist (physician) and Advanced Practice Providers (APPs -  Physician Assistants and Nurse Practitioners) who all work together to provide you with the care you need, when you need it. Your next appointment:   6 month(s) The format for your next appointment:   In Person Provider:   You may see Sherren Mocha, MD or one of the following Advanced Practice Providers on your designated Care Team:    [name], PA-C  Robbie Lis, [place]    Signed, Sherren Mocha, MD  06/22/2019 2:52 PM    [place]

## 2019-06-22 NOTE — Patient Instructions (Signed)

## 2019-07-29 DIAGNOSIS — H353233 Exudative age-related macular degeneration, bilateral, with inactive scar: Secondary | ICD-10-CM | POA: Diagnosis not present

## 2019-08-13 ENCOUNTER — Other Ambulatory Visit: Payer: Self-pay

## 2019-08-13 ENCOUNTER — Ambulatory Visit (INDEPENDENT_AMBULATORY_CARE_PROVIDER_SITE_OTHER): Payer: Medicare Other | Admitting: Internal Medicine

## 2019-08-13 ENCOUNTER — Encounter: Payer: Self-pay | Admitting: Internal Medicine

## 2019-08-13 VITALS — BP 142/68 | HR 74 | Temp 98.2°F | Ht 69.0 in | Wt 148.0 lb

## 2019-08-13 DIAGNOSIS — D638 Anemia in other chronic diseases classified elsewhere: Secondary | ICD-10-CM

## 2019-08-13 DIAGNOSIS — E119 Type 2 diabetes mellitus without complications: Secondary | ICD-10-CM | POA: Diagnosis not present

## 2019-08-13 DIAGNOSIS — E039 Hypothyroidism, unspecified: Secondary | ICD-10-CM

## 2019-08-13 DIAGNOSIS — E7800 Pure hypercholesterolemia, unspecified: Secondary | ICD-10-CM

## 2019-08-13 DIAGNOSIS — N185 Chronic kidney disease, stage 5: Secondary | ICD-10-CM | POA: Diagnosis not present

## 2019-08-13 DIAGNOSIS — I10 Essential (primary) hypertension: Secondary | ICD-10-CM

## 2019-08-13 DIAGNOSIS — I2510 Atherosclerotic heart disease of native coronary artery without angina pectoris: Secondary | ICD-10-CM | POA: Diagnosis not present

## 2019-08-13 LAB — CBC WITH DIFFERENTIAL/PLATELET
Basophils Absolute: 0.1 10*3/uL (ref 0.0–0.1)
Basophils Relative: 0.7 % (ref 0.0–3.0)
Eosinophils Absolute: 0.2 10*3/uL (ref 0.0–0.7)
Eosinophils Relative: 2.7 % (ref 0.0–5.0)
HCT: 30.8 % — ABNORMAL LOW (ref 39.0–52.0)
Hemoglobin: 10.3 g/dL — ABNORMAL LOW (ref 13.0–17.0)
Lymphocytes Relative: 15 % (ref 12.0–46.0)
Lymphs Abs: 1.2 10*3/uL (ref 0.7–4.0)
MCHC: 33.4 g/dL (ref 30.0–36.0)
MCV: 91.2 fl (ref 78.0–100.0)
Monocytes Absolute: 1.3 10*3/uL — ABNORMAL HIGH (ref 0.1–1.0)
Monocytes Relative: 16.5 % — ABNORMAL HIGH (ref 3.0–12.0)
Neutro Abs: 5 10*3/uL (ref 1.4–7.7)
Neutrophils Relative %: 65.1 % (ref 43.0–77.0)
Platelets: 229 10*3/uL (ref 150.0–400.0)
RBC: 3.38 Mil/uL — ABNORMAL LOW (ref 4.22–5.81)
RDW: 15.9 % — ABNORMAL HIGH (ref 11.5–15.5)
WBC: 7.7 10*3/uL (ref 4.0–10.5)

## 2019-08-13 LAB — LIPID PANEL
Cholesterol: 158 mg/dL (ref 0–200)
HDL: 53.6 mg/dL (ref >39.00)
LDL Cholesterol: 82 mg/dL (ref 0–99)
NonHDL: 104.88
Total CHOL/HDL Ratio: 3
Triglycerides: 113 mg/dL (ref 0.0–149.0)
VLDL: 22.6 mg/dL (ref 0.0–40.0)

## 2019-08-13 LAB — HEPATIC FUNCTION PANEL
ALT: 12 U/L (ref 0–53)
AST: 16 U/L (ref 0–37)
Albumin: 4.2 g/dL (ref 3.5–5.2)
Alkaline Phosphatase: 68 U/L (ref 39–117)
Bilirubin, Direct: 0.1 mg/dL (ref 0.0–0.3)
Total Bilirubin: 0.5 mg/dL (ref 0.2–1.2)
Total Protein: 7.3 g/dL (ref 6.0–8.3)

## 2019-08-13 LAB — BASIC METABOLIC PANEL
BUN: 68 mg/dL — ABNORMAL HIGH (ref 6–23)
CO2: 26 mEq/L (ref 19–32)
Calcium: 9.5 mg/dL (ref 8.4–10.5)
Chloride: 101 mEq/L (ref 96–112)
Creatinine, Ser: 3.02 mg/dL — ABNORMAL HIGH (ref 0.40–1.50)
GFR: 19.46 mL/min — ABNORMAL LOW (ref >60.00)
Glucose, Bld: 69 mg/dL — ABNORMAL LOW (ref 70–99)
Potassium: 4.1 mEq/L (ref 3.5–5.1)
Sodium: 136 mEq/L (ref 135–145)

## 2019-08-13 LAB — HEMOGLOBIN A1C: Hgb A1c MFr Bld: 5.8 % (ref 4.6–6.5)

## 2019-08-13 LAB — TSH: TSH: 4.54 u[IU]/mL — ABNORMAL HIGH (ref 0.35–4.50)

## 2019-08-13 NOTE — Assessment & Plan Note (Signed)
stable overall by history and exam, recent data reviewed with pt, and pt to continue medical treatment as before,  to f/u any worsening symptoms or concerns  

## 2019-08-13 NOTE — Progress Notes (Signed)
Subjective:    Patient ID: Henry [name], male    DOB: [birth year], 86 y.o.   MRN: [id number]  HPI  Here to f/u; overall doing ok,  Pt denies chest pain, increasing sob or doe, wheezing, orthopnea, PND, increased LE swelling, palpitations, dizziness or syncope.  Pt denies new neurological symptoms such as new headache, or facial or extremity weakness or numbness.  Pt denies polydipsia, polyuria, or low sugar episode.  Pt states overall good compliance with meds, mostly trying to follow appropriate diet, with wt overall stable,  but little exercise however.  No overt bleeding no new complaints Past Medical History:  Diagnosis Date  . Anemia   . CAD of autologous bypass graft   . Carotid artery disease ([place])   . Diabetes mellitus without complication ([place])   . DJD (degenerative joint disease)   . History of anemia of chronic disease   . Hypercholesteremia   . Hypertension   . NSTEMI (non-ST elevated myocardial infarction) ([place])   . PVD (peripheral vascular disease) with claudication ([place])   . Renal insufficiency   . Wears dentures   . Wears glasses    Past Surgical History:  Procedure Laterality Date  . A/V FISTULAGRAM N/A 07/15/2018   Procedure: A/V FISTULAGRAM - Left Arm;  Surgeon: Serafina Mitchell, MD;  Location: [facility];  Service: Cardiovascular;  Laterality: N/A;  . AV FISTULA PLACEMENT Left 03/20/2018   Procedure: ARTERIOVENOUS (AV) FISTULA CREATION LEFT ARM;  Surgeon: Serafina Mitchell, MD;  [facility];  Service: Vascular;  Laterality: Left;  . cataract surgery    . COLONOSCOPY     Hx: of  . CORONARY ARTERY BYPASS GRAFT  2000   by Dr. Cyndia Bent  . LEFT HEART CATH AND CORS/GRAFTS ANGIOGRAPHY N/A 03/27/2018   Procedure: LEFT HEART CATH AND CORS/GRAFTS ANGIOGRAPHY;  Surgeon: Sherren Mocha, MD;  Location: [facility];  Service: Cardiovascular;  Laterality: N/A;  . LUMBAR LAMINECTOMY/DECOMPRESSION MICRODISCECTOMY N/A 09/10/2012   Procedure: LUMBAR DECOMPRESSION,   IN SITU FUSION LUMBAR 4-5;  Surgeon: Melina Schools, MD;  Location: [facility];  Service: Orthopedics;  Laterality: N/A;  . MULTIPLE TOOTH EXTRACTIONS    . TONSILLECTOMY    . UPPER EXTREMITY VENOGRAPHY Left 07/15/2018   Procedure: UPPER EXTREMITY VENOGRAPHY;  Surgeon: Serafina Mitchell, MD;  Location: [facility];  Service: Cardiovascular;  Laterality: Left;  UPPER ARM    reports that he quit smoking about 66 years ago. His smoking use included cigarettes. He has a 10.00 pack-year smoking history. He has never used smokeless tobacco. He reports previous alcohol use. He reports that he does not use drugs. family history includes Heart disease in his father and mother. No Known Allergies Current Outpatient Medications on File Prior to Visit  Medication Sig Dispense Refill  . amLODipine (NORVASC) 10 MG tablet Take 1 tablet (10 mg total) by mouth every evening. 90 tablet 3  . aspirin EC 81 MG tablet Take 81 mg by mouth daily.     Marland Kitchen atorvastatin (LIPITOR) 40 MG tablet Take 1 tablet (40 mg total) by mouth every evening. 30 tablet 0  . b complex-vitamin c-folic acid (NEPHRO-VITE) 0.8 MG TABS tablet Take 1 tablet by mouth at bedtime.    . calcitRIOL (ROCALTROL) 0.25 MCG capsule Take 0.25 mcg by mouth 2 (two) times a week.     . calcium acetate (PHOSLO) 667 MG capsule Take 667 mg by mouth 2 (two) times daily with a meal.    .  clopidogrel (PLAVIX) 75 MG tablet Take 1 tablet (75 mg total) by mouth daily. 30 tablet 0  . Darbepoetin Alfa (ARANESP) 100 MCG/0.5ML SOSY injection Inject 100 mcg into the skin every 30 (thirty) days.     . ferrous gluconate (FERGON) 325 MG tablet Take 325 mg by mouth daily.     . furosemide (LASIX) 40 MG tablet Take 40 mg by mouth daily.    Marland Kitchen glucose blood (ACCU-CHEK AVIVA PLUS) test strip CHECK BLOOD SUGAR TWICE A DAY. E11.9 50 each 11  . hydrALAZINE (APRESOLINE) 25 MG tablet Take 1 tablet (25 mg total) by mouth 2 (two) times daily. 180 tablet 3  . Lancets (ACCU-CHEK MULTICLIX)  lancets USE TWICE DAILY. 102 each 5  . levothyroxine (SYNTHROID) 50 MCG tablet Take 50 mcg by mouth daily before breakfast.    . Multiple Vitamins-Minerals (PRESERVISION AREDS 2 PO) Take 1 tablet by mouth daily.    . traMADol (ULTRAM) 50 MG tablet Take 50 mg by mouth every 6 (six) hours as needed for moderate pain.     No current facility-administered medications on file prior to visit.   Review of Systems All otherwise neg per pt     Objective:   Physical Exam BP (!) 142/68 (BP Location: Left Arm, Patient Position: Sitting, Cuff Size: Large)   Pulse 74   Temp 98.2 F (36.8 C) (Oral)   Ht 5\' 9"  (1.753 m)   Wt 148 lb (67.1 kg)   SpO2 94%   BMI 21.86 kg/m  VS noted,  Constitutional: Pt appears in NAD HENT: Head: NCAT.  Right Ear: External ear normal.  Left Ear: External ear normal.  Eyes: . Pupils are equal, round, and reactive to light. Conjunctivae and EOM are normal Nose: without d/c or deformity Neck: Neck supple. Gross normal ROM Cardiovascular: Normal rate and regular rhythm.   Pulmonary/Chest: Effort normal and breath sounds without rales or wheezing.  Abd:  Soft, NT, ND, + BS, no organomegaly Neurological: Pt is alert. At baseline orientation, motor grossly intact Skin: Skin is warm. No rashes, other new lesions, no LE edema Psychiatric: Pt behavior is normal without agitation  All otherwise neg per pt Lab Results  Component Value Date   WBC 7.7 08/13/2019   HGB 10.3 (L) 08/13/2019   HCT 30.8 (L) 08/13/2019   PLT 229.0 08/13/2019   GLUCOSE 69 (L) 08/13/2019   CHOL 158 08/13/2019   TRIG 113.0 08/13/2019   HDL 53.60 08/13/2019   LDLDIRECT 96.2 04/04/2007   LDLCALC 82 08/13/2019   ALT 12 08/13/2019   AST 16 08/13/2019   NA 136 08/13/2019   K 4.1 08/13/2019   CL 101 08/13/2019   CREATININE 3.02 (H) 08/13/2019   BUN 68 (H) 08/13/2019   CO2 26 08/13/2019   TSH 4.54 (H) 08/13/2019   PSA 1.47 01/03/2010   INR 1.13 03/25/2018   HGBA1C 5.8 08/13/2019    MICROALBUR 46.3 (H) 02/12/2019      Assessment & Plan:

## 2019-08-13 NOTE — Assessment & Plan Note (Addendum)
stable overall by history and exam, recent data reviewed with pt, and pt to continue medical treatment as before,  to f/u any worsening symptoms or concerns  I spent 31 minutes in preparing to see the patient by review of recent labs, imaging and procedures, obtaining and reviewing separately obtained history, communicating with the patient and family or caregiver, ordering medications, tests or procedures, and documenting clinical information in the EHR including the differential Dx, treatment, and any further evaluation and other management of dm, htn, hld, hypothyroidism, ckd, anemia

## 2019-08-13 NOTE — Patient Instructions (Signed)

## 2019-10-17 ENCOUNTER — Other Ambulatory Visit: Payer: Self-pay | Admitting: Internal Medicine

## 2019-10-17 NOTE — Telephone Encounter (Signed)
Please refill as per office routine med refill policy (all routine meds refilled for 3 mo or monthly per pt preference up to one year from last visit, then month to month grace period for 3 mo, then further med refills will have to be denied)  

## 2019-11-09 ENCOUNTER — Other Ambulatory Visit: Payer: Self-pay

## 2019-11-09 ENCOUNTER — Inpatient Hospital Stay (HOSPITAL_COMMUNITY)
Admission: EM | Admit: 2019-11-09 | Discharge: 2019-11-13 | DRG: 291 | Disposition: A | Discharge status: Home Health | Payer: Medicare Other | Attending: Family Medicine | Admitting: Family Medicine

## 2019-11-09 ENCOUNTER — Telehealth: Payer: Self-pay | Admitting: Internal Medicine

## 2019-11-09 ENCOUNTER — Emergency Department (HOSPITAL_COMMUNITY): Payer: Medicare Other

## 2019-11-09 ENCOUNTER — Telehealth: Payer: Self-pay | Admitting: Cardiovascular Disease

## 2019-11-09 ENCOUNTER — Ambulatory Visit: Payer: Medicare Other | Admitting: Family

## 2019-11-09 DIAGNOSIS — Z515 Encounter for palliative care: Secondary | ICD-10-CM | POA: Diagnosis not present

## 2019-11-09 DIAGNOSIS — I5033 Acute on chronic diastolic (congestive) heart failure: Secondary | ICD-10-CM | POA: Diagnosis not present

## 2019-11-09 DIAGNOSIS — R0789 Other chest pain: Secondary | ICD-10-CM | POA: Diagnosis not present

## 2019-11-09 DIAGNOSIS — I350 Nonrheumatic aortic (valve) stenosis: Secondary | ICD-10-CM

## 2019-11-09 DIAGNOSIS — D638 Anemia in other chronic diseases classified elsewhere: Secondary | ICD-10-CM | POA: Diagnosis not present

## 2019-11-09 DIAGNOSIS — Z79890 Hormone replacement therapy (postmenopausal): Secondary | ICD-10-CM

## 2019-11-09 DIAGNOSIS — R42 Dizziness and giddiness: Secondary | ICD-10-CM | POA: Diagnosis not present

## 2019-11-09 DIAGNOSIS — N184 Chronic kidney disease, stage 4 (severe): Secondary | ICD-10-CM | POA: Diagnosis present

## 2019-11-09 DIAGNOSIS — I252 Old myocardial infarction: Secondary | ICD-10-CM

## 2019-11-09 DIAGNOSIS — J9811 Atelectasis: Secondary | ICD-10-CM | POA: Diagnosis not present

## 2019-11-09 DIAGNOSIS — I213 ST elevation (STEMI) myocardial infarction of unspecified site: Secondary | ICD-10-CM | POA: Diagnosis not present

## 2019-11-09 DIAGNOSIS — I2510 Atherosclerotic heart disease of native coronary artery without angina pectoris: Secondary | ICD-10-CM | POA: Diagnosis not present

## 2019-11-09 DIAGNOSIS — Z79899 Other long term (current) drug therapy: Secondary | ICD-10-CM

## 2019-11-09 DIAGNOSIS — Z87891 Personal history of nicotine dependence: Secondary | ICD-10-CM

## 2019-11-09 DIAGNOSIS — E1151 Type 2 diabetes mellitus with diabetic peripheral angiopathy without gangrene: Secondary | ICD-10-CM | POA: Diagnosis present

## 2019-11-09 DIAGNOSIS — I351 Nonrheumatic aortic (valve) insufficiency: Secondary | ICD-10-CM | POA: Diagnosis not present

## 2019-11-09 DIAGNOSIS — Z7189 Other specified counseling: Secondary | ICD-10-CM | POA: Diagnosis not present

## 2019-11-09 DIAGNOSIS — R778 Other specified abnormalities of plasma proteins: Secondary | ICD-10-CM

## 2019-11-09 DIAGNOSIS — R7989 Other specified abnormal findings of blood chemistry: Secondary | ICD-10-CM | POA: Diagnosis not present

## 2019-11-09 DIAGNOSIS — I517 Cardiomegaly: Secondary | ICD-10-CM | POA: Diagnosis not present

## 2019-11-09 DIAGNOSIS — E785 Hyperlipidemia, unspecified: Secondary | ICD-10-CM | POA: Diagnosis present

## 2019-11-09 DIAGNOSIS — Z20822 Contact with and (suspected) exposure to covid-19: Secondary | ICD-10-CM | POA: Diagnosis present

## 2019-11-09 DIAGNOSIS — E039 Hypothyroidism, unspecified: Secondary | ICD-10-CM | POA: Diagnosis not present

## 2019-11-09 DIAGNOSIS — I10 Essential (primary) hypertension: Secondary | ICD-10-CM | POA: Diagnosis not present

## 2019-11-09 DIAGNOSIS — D649 Anemia, unspecified: Secondary | ICD-10-CM | POA: Diagnosis not present

## 2019-11-09 DIAGNOSIS — Z23 Encounter for immunization: Secondary | ICD-10-CM

## 2019-11-09 DIAGNOSIS — I509 Heart failure, unspecified: Secondary | ICD-10-CM

## 2019-11-09 DIAGNOSIS — J90 Pleural effusion, not elsewhere classified: Secondary | ICD-10-CM | POA: Diagnosis not present

## 2019-11-09 DIAGNOSIS — Z951 Presence of aortocoronary bypass graft: Secondary | ICD-10-CM

## 2019-11-09 DIAGNOSIS — I5043 Acute on chronic combined systolic (congestive) and diastolic (congestive) heart failure: Secondary | ICD-10-CM | POA: Diagnosis present

## 2019-11-09 DIAGNOSIS — Z7982 Long term (current) use of aspirin: Secondary | ICD-10-CM | POA: Diagnosis not present

## 2019-11-09 DIAGNOSIS — Z66 Do not resuscitate: Secondary | ICD-10-CM | POA: Diagnosis present

## 2019-11-09 DIAGNOSIS — N179 Acute kidney failure, unspecified: Secondary | ICD-10-CM | POA: Diagnosis present

## 2019-11-09 DIAGNOSIS — R0602 Shortness of breath: Secondary | ICD-10-CM

## 2019-11-09 DIAGNOSIS — R059 Cough, unspecified: Secondary | ICD-10-CM

## 2019-11-09 DIAGNOSIS — R072 Precordial pain: Secondary | ICD-10-CM

## 2019-11-09 DIAGNOSIS — R079 Chest pain, unspecified: Secondary | ICD-10-CM | POA: Diagnosis not present

## 2019-11-09 DIAGNOSIS — I440 Atrioventricular block, first degree: Secondary | ICD-10-CM | POA: Diagnosis present

## 2019-11-09 DIAGNOSIS — I340 Nonrheumatic mitral (valve) insufficiency: Secondary | ICD-10-CM | POA: Diagnosis not present

## 2019-11-09 DIAGNOSIS — I130 Hypertensive heart and chronic kidney disease with heart failure and stage 1 through stage 4 chronic kidney disease, or unspecified chronic kidney disease: Secondary | ICD-10-CM | POA: Diagnosis present

## 2019-11-09 DIAGNOSIS — E1122 Type 2 diabetes mellitus with diabetic chronic kidney disease: Secondary | ICD-10-CM | POA: Diagnosis present

## 2019-11-09 DIAGNOSIS — D631 Anemia in chronic kidney disease: Secondary | ICD-10-CM | POA: Diagnosis present

## 2019-11-09 DIAGNOSIS — R0902 Hypoxemia: Secondary | ICD-10-CM | POA: Diagnosis not present

## 2019-11-09 DIAGNOSIS — E7800 Pure hypercholesterolemia, unspecified: Secondary | ICD-10-CM | POA: Diagnosis present

## 2019-11-09 DIAGNOSIS — R05 Cough: Secondary | ICD-10-CM | POA: Diagnosis not present

## 2019-11-09 DIAGNOSIS — Z7902 Long term (current) use of antithrombotics/antiplatelets: Secondary | ICD-10-CM | POA: Diagnosis not present

## 2019-11-09 LAB — CBC WITH DIFFERENTIAL/PLATELET
Abs Immature Granulocytes: 0.05 10*3/uL (ref 0.00–0.07)
Basophils Absolute: 0.1 10*3/uL (ref 0.0–0.1)
Basophils Relative: 1 %
Eosinophils Absolute: 0.1 10*3/uL (ref 0.0–0.5)
Eosinophils Relative: 1 %
HCT: 26 % — ABNORMAL LOW (ref 39.0–52.0)
Hemoglobin: 8.4 g/dL — ABNORMAL LOW (ref 13.0–17.0)
Immature Granulocytes: 1 %
Lymphocytes Relative: 6 %
Lymphs Abs: 0.6 10*3/uL — ABNORMAL LOW (ref 0.7–4.0)
MCH: 30.5 pg (ref 26.0–34.0)
MCHC: 32.3 g/dL (ref 30.0–36.0)
MCV: 94.5 fL (ref 80.0–100.0)
Monocytes Absolute: 1.4 10*3/uL — ABNORMAL HIGH (ref 0.1–1.0)
Monocytes Relative: 14 %
Neutro Abs: 7.6 10*3/uL (ref 1.7–7.7)
Neutrophils Relative %: 77 %
Platelets: 247 10*3/uL (ref 150–400)
RBC: 2.75 MIL/uL — ABNORMAL LOW (ref 4.22–5.81)
RDW: 14.5 % (ref 11.5–15.5)
WBC: 9.9 10*3/uL (ref 4.0–10.5)
nRBC: 0 % (ref 0.0–0.2)

## 2019-11-09 LAB — SARS CORONAVIRUS 2 BY RT PCR (HOSPITAL ORDER, PERFORMED IN ~~LOC~~ HOSPITAL LAB): SARS Coronavirus 2: NEGATIVE

## 2019-11-09 LAB — COMPREHENSIVE METABOLIC PANEL
ALT: 16 U/L (ref 0–44)
AST: 21 U/L (ref 15–41)
Albumin: 3.6 g/dL (ref 3.5–5.0)
Alkaline Phosphatase: 79 U/L (ref 38–126)
Anion gap: 14 (ref 5–15)
BUN: 51 mg/dL — ABNORMAL HIGH (ref 8–23)
CO2: 18 mmol/L — ABNORMAL LOW (ref 22–32)
Calcium: 9.3 mg/dL (ref 8.9–10.3)
Chloride: 103 mmol/L (ref 98–111)
Creatinine, Ser: 3.35 mg/dL — ABNORMAL HIGH (ref 0.61–1.24)
GFR calc Af Amer: 17 mL/min — ABNORMAL LOW (ref >60)
GFR calc non Af Amer: 15 mL/min — ABNORMAL LOW (ref >60)
Glucose, Bld: 132 mg/dL — ABNORMAL HIGH (ref 70–99)
Potassium: 4.1 mmol/L (ref 3.5–5.1)
Sodium: 135 mmol/L (ref 135–145)
Total Bilirubin: 0.9 mg/dL (ref 0.3–1.2)
Total Protein: 7 g/dL (ref 6.5–8.1)

## 2019-11-09 LAB — TROPONIN I (HIGH SENSITIVITY)
Troponin I (High Sensitivity): 439 ng/L (ref <18)
Troponin I (High Sensitivity): 489 ng/L (ref <18)
Troponin I (High Sensitivity): 613 ng/L (ref <18)
Troponin I (High Sensitivity): 724 ng/L (ref <18)

## 2019-11-09 LAB — TYPE AND SCREEN
ABO/RH(D): A POS
Antibody Screen: NEGATIVE

## 2019-11-09 LAB — MAGNESIUM: Magnesium: 2.1 mg/dL (ref 1.7–2.4)

## 2019-11-09 LAB — LACTIC ACID, PLASMA: Lactic Acid, Venous: 1.2 mmol/L (ref 0.5–1.9)

## 2019-11-09 LAB — LIPASE, BLOOD: Lipase: 24 U/L (ref 11–51)

## 2019-11-09 LAB — BRAIN NATRIURETIC PEPTIDE: B Natriuretic Peptide: 979.3 pg/mL — ABNORMAL HIGH (ref 0.0–100.0)

## 2019-11-09 LAB — TSH: TSH: 4.697 u[IU]/mL — ABNORMAL HIGH (ref 0.350–4.500)

## 2019-11-09 LAB — D-DIMER, QUANTITATIVE: D-Dimer, Quant: 1.32 ug/mL-FEU — ABNORMAL HIGH (ref 0.00–0.50)

## 2019-11-09 MED ORDER — CLOPIDOGREL BISULFATE 75 MG PO TABS
75.0000 mg | ORAL_TABLET | Freq: Every day | ORAL | Status: DC
  Administered 2019-11-09 – 2019-11-10 (×2): 75 mg via ORAL
  Filled 2019-11-09 (×2): qty 1

## 2019-11-09 MED ORDER — SODIUM CHLORIDE 0.9% FLUSH
3.0000 mL | Freq: Two times a day (BID) | INTRAVENOUS | Status: DC
  Administered 2019-11-10 – 2019-11-13 (×5): 3 mL via INTRAVENOUS

## 2019-11-09 MED ORDER — ASPIRIN EC 81 MG PO TBEC
81.0000 mg | DELAYED_RELEASE_TABLET | Freq: Every day | ORAL | Status: DC
  Administered 2019-11-09 – 2019-11-13 (×5): 81 mg via ORAL
  Filled 2019-11-09 (×5): qty 1

## 2019-11-09 MED ORDER — CALCITRIOL 0.25 MCG PO CAPS
0.2500 ug | ORAL_CAPSULE | ORAL | Status: DC
  Administered 2019-11-09: 0.25 ug via ORAL
  Filled 2019-11-09: qty 1

## 2019-11-09 MED ORDER — ATORVASTATIN CALCIUM 40 MG PO TABS
40.0000 mg | ORAL_TABLET | Freq: Every evening | ORAL | Status: DC
  Administered 2019-11-09 – 2019-11-12 (×4): 40 mg via ORAL
  Filled 2019-11-09 (×4): qty 1

## 2019-11-09 MED ORDER — ACETAMINOPHEN 325 MG PO TABS: 650.0000 mg | ORAL_TABLET | ORAL | Status: DC | PRN

## 2019-11-09 MED ORDER — FUROSEMIDE 10 MG/ML IJ SOLN
40.0000 mg | Freq: Once | INTRAMUSCULAR | Status: AC
  Administered 2019-11-09: 40 mg via INTRAVENOUS
  Filled 2019-11-09: qty 4

## 2019-11-09 MED ORDER — RENA-VITE PO TABS
1.0000 | ORAL_TABLET | Freq: Every day | ORAL | Status: DC
  Administered 2019-11-09 – 2019-11-12 (×4): 1 via ORAL
  Filled 2019-11-09 (×4): qty 1

## 2019-11-09 MED ORDER — INFLUENZA VAC A&B SA ADJ QUAD 0.5 ML IM PRSY
0.5000 mL | PREFILLED_SYRINGE | INTRAMUSCULAR | Status: AC
  Administered 2019-11-13: 0.5 mL via INTRAMUSCULAR
  Filled 2019-11-09: qty 0.5

## 2019-11-09 MED ORDER — SODIUM CHLORIDE 0.9% FLUSH: 3.0000 mL | INTRAVENOUS | Status: DC | PRN

## 2019-11-09 MED ORDER — SODIUM CHLORIDE 0.9 % IV SOLN: 250.0000 mL | INTRAVENOUS | Status: DC | PRN

## 2019-11-09 MED ORDER — CALCIUM ACETATE (PHOS BINDER) 667 MG PO CAPS
667.0000 mg | ORAL_CAPSULE | Freq: Two times a day (BID) | ORAL | Status: DC
  Administered 2019-11-10 – 2019-11-13 (×7): 667 mg via ORAL
  Filled 2019-11-09 (×10): qty 1

## 2019-11-09 MED ORDER — LEVOTHYROXINE SODIUM 50 MCG PO TABS
50.0000 ug | ORAL_TABLET | Freq: Every day | ORAL | Status: DC
  Administered 2019-11-10 – 2019-11-13 (×4): 50 ug via ORAL
  Filled 2019-11-09 (×4): qty 1

## 2019-11-09 MED ORDER — HEPARIN SODIUM (PORCINE) 5000 UNIT/ML IJ SOLN
5000.0000 [IU] | Freq: Three times a day (TID) | INTRAMUSCULAR | Status: DC
  Administered 2019-11-09 – 2019-11-10 (×3): 5000 [IU] via SUBCUTANEOUS
  Filled 2019-11-09 (×2): qty 1

## 2019-11-09 MED ORDER — NITROGLYCERIN 0.4 MG SL SUBL
0.4000 mg | SUBLINGUAL_TABLET | SUBLINGUAL | Status: DC | PRN
  Administered 2019-11-09: 0.4 mg via SUBLINGUAL
  Filled 2019-11-09: qty 1

## 2019-11-09 MED ORDER — ONDANSETRON HCL 4 MG/2ML IJ SOLN: 4.0000 mg | Freq: Four times a day (QID) | INTRAMUSCULAR | Status: DC | PRN

## 2019-11-09 MED ORDER — AMLODIPINE BESYLATE 10 MG PO TABS
10.0000 mg | ORAL_TABLET | Freq: Every evening | ORAL | Status: DC
  Administered 2019-11-09 – 2019-11-12 (×4): 10 mg via ORAL
  Filled 2019-11-09 (×4): qty 1

## 2019-11-09 NOTE — Telephone Encounter (Signed)
Agree- go to ED 

## 2019-11-09 NOTE — ED Triage Notes (Signed)
Pt arrives via EMS from home with complaints of chest pressure and SOB since 11/06/19. Pt states he was becoming more SOB with light headness. Endorses productive cough.

## 2019-11-09 NOTE — ED Notes (Signed)
During triage pt was taken off supplemental O2 to assess SpO2. Pt maintained 95% on RA. Pt did begin coughing with and produced blood tinged mucus.

## 2019-11-09 NOTE — ED Provider Notes (Signed)
[facility] EMERGENCY DEPARTMENT Provider Note   CSN: [id number] Arrival date & time: 11/09/19  0950     History Chief Complaint  Patient presents with  . Chest Pain    Henry [name] is a 86 y.o. male.  The history is provided by the patient, a relative, the EMS personnel and medical records.  Chest Pain Pain location:  Substernal area and L chest Pain quality: aching, crushing and pressure   Pain radiates to:  Does not radiate Pain severity:  Moderate Onset quality:  Gradual Duration:  3 days Timing:  Intermittent Progression:  Waxing and waning Chronicity:  New Relieved by:  Oxygen Worsened by:  Coughing, deep breathing and exertion Ineffective treatments:  None tried Associated symptoms: cough, diaphoresis, fatigue and shortness of breath   Associated symptoms: no abdominal pain, no altered mental status, no anxiety, no back pain, no dizziness, no fever, no headache, no lower extremity edema, no nausea, no near-syncope, no numbness, no palpitations, no vomiting and no weakness   Risk factors: coronary artery disease, high cholesterol, hypertension and male sex   Risk factors: no prior DVT/PE        Past Medical History:  Diagnosis Date  . Anemia   . CAD of autologous bypass graft   . Carotid artery disease ([place])   . Diabetes mellitus without complication ([place])   . DJD (degenerative joint disease)   . History of anemia of chronic disease   . Hypercholesteremia   . Hypertension   . NSTEMI (non-ST elevated myocardial infarction) ([place])   . PVD (peripheral vascular disease) with claudication ([place])   . Renal insufficiency   . Wears dentures   . Wears glasses     Patient Active Problem List   Diagnosis Date Noted  . Mobitz type 1 second degree atrioventricular block 05/02/2018  . Chronic diastolic CHF (congestive heart failure) ([place])   . NSTEMI 02/2018 - 2/4 grafts occluded; Med Rx   . Respiratory failure with hypoxia ([place]) 03/25/2018  .  Pulmonary edema 03/25/2018  . Respiratory failure, acute ([place]) 03/25/2018  . Elevated troponin 03/25/2018  . Vitamin D deficiency 01/01/2018  . CKD (chronic kidney disease) stage 5, GFR less than 15 ml/min (HCC) 07/24/2017  . Carotid artery disease ([place])   . Hypertension   . PVD (peripheral vascular disease) with claudication ([place])   . Peripheral vascular disease with claudication ([place]) 01/22/2017  . Aortic valve stenosis, unspecified etiology 01/22/2017  . Postinflammatory pulmonary fibrosis ([place]) 07/31/2016  . Acute bronchitis 03/07/2016  . Hypothyroidism 08/31/2014  . Carotid stenosis 08/19/2012  . DJD (degenerative joint disease) 08/19/2012  . LBP (low back pain) 08/19/2012  . Cerebral atrophy ([place]) 08/19/2012  . Dizziness 04/13/2012  . Diabetes mellitus type 2, uncomplicated ([place]) 36/14/4315  . Nausea and vomiting 04/10/2012  . CAD (coronary artery disease) 07/13/2008  . HYPERCHOLESTEROLEMIA 04/04/2007  . Anemia of chronic disease 04/04/2007    Past Surgical History:  Procedure Laterality Date  . A/V FISTULAGRAM N/A 07/15/2018   Procedure: A/V FISTULAGRAM - Left Arm;  Surgeon: Serafina Mitchell, MD;  Location: [place] [facility];  Service: Cardiovascular;  Laterality: N/A;  . AV FISTULA PLACEMENT Left 03/20/2018   Procedure: ARTERIOVENOUS (AV) FISTULA CREATION LEFT ARM;  Surgeon: Serafina Mitchell, MD;  Location: [facility];  Service: Vascular;  Laterality: Left;  . cataract surgery    . COLONOSCOPY     Hx: of  . CORONARY ARTERY BYPASS GRAFT  2000   by Dr.  Bartle  . LEFT HEART CATH AND CORS/GRAFTS ANGIOGRAPHY N/A 03/27/2018   Procedure: LEFT HEART CATH AND CORS/GRAFTS ANGIOGRAPHY;  Surgeon: Sherren Mocha, MD;  Location: [place] [facility];  Service: Cardiovascular;  Laterality: N/A;  . LUMBAR LAMINECTOMY/DECOMPRESSION MICRODISCECTOMY N/A 09/10/2012   Procedure: LUMBAR DECOMPRESSION,  IN SITU FUSION LUMBAR 4-5;  Surgeon: Melina Schools, MD;  Location: [place];  Service: Orthopedics;   Laterality: N/A;  . MULTIPLE TOOTH EXTRACTIONS    . TONSILLECTOMY    . UPPER EXTREMITY VENOGRAPHY Left 07/15/2018   Procedure: UPPER EXTREMITY VENOGRAPHY;  Surgeon: Serafina Mitchell, MD;  Location: [place] [facility];  Service: Cardiovascular;  Laterality: Left;  UPPER ARM       Family History  Problem Relation Age of Onset  . Heart disease Mother   . Heart disease Father     Social History   Tobacco Use  . Smoking status: Former Smoker    Packs/day: 1.00    Years: 10.00    Pack years: 10.00    Types: Cigarettes    Quit date: 02/26/1953    Years since quitting: 66.7  . Smokeless tobacco: Never Used  Vaping Use  . Vaping Use: Never used  Substance Use Topics  . Alcohol use: Not Currently    Alcohol/week: 0.0 standard drinks  . Drug use: No    Home Medications Prior to Admission medications   Medication Sig Start Date End Date Taking? Authorizing Provider  amLODipine (NORVASC) 10 MG tablet TAKE 1 TABLET BY MOUTH ONCE DAILY IN THE EVENING 10/20/19   [name], MD  aspirin EC 81 MG tablet Take 81 mg by mouth daily.     [provider]  atorvastatin (LIPITOR) 40 MG tablet Take 1 tablet (40 mg total) by mouth every evening. 04/02/18   Mariel Aloe, MD  b complex-vitamin c-folic acid (NEPHRO-VITE) 0.8 MG TABS tablet Take 1 tablet by mouth at bedtime.    [provider]  calcitRIOL (ROCALTROL) 0.25 MCG capsule Take 0.25 mcg by mouth 2 (two) times a week.     [provider]  calcium acetate (PHOSLO) 667 MG capsule Take 667 mg by mouth 2 (two) times daily with a meal.    [provider]  clopidogrel (PLAVIX) 75 MG tablet Take 1 tablet (75 mg total) by mouth daily. 04/03/18   Mariel Aloe, MD  Darbepoetin Alfa (ARANESP) 100 MCG/0.5ML SOSY injection Inject 100 mcg into the skin every 30 (thirty) days.     [provider]  ferrous gluconate (FERGON) 325 MG tablet Take 325 mg by mouth daily.     [provider]  furosemide  (LASIX) 40 MG tablet Take 40 mg by mouth daily.    [provider]  glucose blood (ACCU-CHEK AVIVA PLUS) test strip CHECK BLOOD SUGAR TWICE A DAY. E11.9 08/13/18   [name], MD  hydrALAZINE (APRESOLINE) 25 MG tablet Take 1 tablet (25 mg total) by mouth 2 (two) times daily. 08/13/18   [name], MD  Lancets (ACCU-CHEK MULTICLIX) lancets USE TWICE DAILY. 03/29/15   Noralee Space, MD  levothyroxine (SYNTHROID) 50 MCG tablet Take 50 mcg by mouth daily before breakfast.    [provider]  Multiple Vitamins-Minerals (PRESERVISION AREDS 2 PO) Take 1 tablet by mouth daily.    [provider]  traMADol (ULTRAM) 50 MG tablet Take 50 mg by mouth every 6 (six) hours as needed for moderate pain.    [provider]  Allergies    Patient has no known allergies.  Review of Systems   Review of Systems  Constitutional: Positive for chills, diaphoresis and fatigue. Negative for fever.  HENT: Negative for congestion.   Eyes: Negative for visual disturbance.  Respiratory: Positive for cough, chest tightness and shortness of breath. Negative for choking, wheezing and stridor.   Cardiovascular: Positive for chest pain. Negative for palpitations, leg swelling and near-syncope.  Gastrointestinal: Negative for abdominal pain, constipation, diarrhea, nausea and vomiting.  Genitourinary: Negative for flank pain and frequency.  Musculoskeletal: Negative for back pain, neck pain and neck stiffness.  Skin: Negative for rash and wound.  Neurological: Positive for light-headedness. Negative for dizziness, seizures, facial asymmetry, weakness, numbness and headaches.  Psychiatric/Behavioral: Negative for agitation.  All other systems reviewed and are negative.   Physical Exam Updated Vital Signs BP (!) 141/62 (BP Location: Right Arm)   Pulse 88   Temp 98.2 F (36.8 C) (Oral)   Resp 20   SpO2 95%   Physical Exam Vitals and nursing note reviewed.  Constitutional:       General: He is not in acute distress.    Appearance: He is well-developed. He is not ill-appearing, toxic-appearing or diaphoretic.  HENT:     Head: Normocephalic and atraumatic.  Eyes:     Conjunctiva/sclera: Conjunctivae normal.  Cardiovascular:     Rate and Rhythm: Normal rate and regular rhythm.     Heart sounds: Normal heart sounds. No murmur heard.   Pulmonary:     Effort: Pulmonary effort is normal. No tachypnea, accessory muscle usage or respiratory distress.     Breath sounds: No stridor. Rhonchi present. No wheezing.  Chest:     Chest wall: No tenderness.  Abdominal:     Palpations: Abdomen is soft.     Tenderness: There is no abdominal tenderness.  Musculoskeletal:     Cervical back: Neck supple.     Right lower leg: No tenderness. No edema.     Left lower leg: No tenderness. No edema.  Skin:    General: Skin is warm and dry.     Capillary Refill: Capillary refill takes less than 2 seconds.     Findings: No rash.  Neurological:     General: No focal deficit present.     Mental Status: He is alert and oriented to person, place, and time.     ED Results / Procedures / Treatments   Labs (all labs ordered are listed, but only abnormal results are displayed) Labs Reviewed  BRAIN NATRIURETIC PEPTIDE - Abnormal; Notable for the following components:      Result Value   B Natriuretic Peptide 979.3 (*)    All other components within normal limits  CBC WITH DIFFERENTIAL/PLATELET - Abnormal; Notable for the following components:   RBC 2.75 (*)    Hemoglobin 8.4 (*)    HCT 26.0 (*)    Lymphs Abs 0.6 (*)    Monocytes Absolute 1.4 (*)    All other components within normal limits  COMPREHENSIVE METABOLIC PANEL - Abnormal; Notable for the following components:   CO2 18 (*)    Glucose, Bld 132 (*)    BUN 51 (*)    Creatinine, Ser 3.35 (*)    GFR calc non Af Amer 15 (*)    GFR calc Af Amer 17 (*)    All other components within normal limits  D-DIMER, QUANTITATIVE (NOT  AT [facility]) - Abnormal; Notable for the following components:   D-Dimer, Quant 1.32 (*)  All other components within normal limits  TSH - Abnormal; Notable for the following components:   TSH 4.697 (*)    All other components within normal limits  TROPONIN I (HIGH SENSITIVITY) - Abnormal; Notable for the following components:   Troponin I (High Sensitivity) 439 (*)    All other components within normal limits  TROPONIN I (HIGH SENSITIVITY) - Abnormal; Notable for the following components:   Troponin I (High Sensitivity) 489 (*)    All other components within normal limits  SARS CORONAVIRUS 2 BY RT PCR (HOSPITAL ORDER, [facility])  LACTIC ACID, PLASMA  LIPASE, BLOOD  MAGNESIUM  TROPONIN I (HIGH SENSITIVITY)  TROPONIN I (HIGH SENSITIVITY)    EKG EKG Interpretation  Date/Time:  Monday November 09 2019 09:54:44 EDT Ventricular Rate:  91 PR Interval:    QRS Duration: 115 QT Interval:  333 QTC Calculation: 410 R Axis:   49 Text Interpretation: Sinus or ectopic atrial rhythm Prolonged PR interval Incomplete left bundle branch block LVH with secondary repolarization abnormality Anterior ST elevation, probably due to LVH When compared to most recent ECG, T wave inversions now present in leads 1,2,AVF,AVL,and V6. this is similar to ECG in jan  2020. No STEMI Confirmed by Antony Blackbird [phone]) on 11/09/2019 10:07:40 AM   Radiology DG Chest Portable 1 View  Result Date: 11/09/2019 CLINICAL DATA:  Dizziness and cough.  Chest pain. EXAM: PORTABLE CHEST 1 VIEW COMPARISON:  Two-view chest x-ray 02/3118 FINDINGS: Heart is enlarged. Diffuse interstitial pattern is seen. Bilateral pleural effusions are present, right greater than left. Bibasilar airspace disease likely reflects atelectasis. Median sternotomy noted. IMPRESSION: 1. Cardiomegaly with interstitial edema and bilateral pleural effusions compatible with congestive heart failure. 2. Bibasilar airspace disease  likely reflects atelectasis. Electronically Signed   By: San Morelle M.D.   On: 11/09/2019 10:33    Procedures Procedures (including critical care time)  Medications Ordered in ED Medications  nitroGLYCERIN (NITROSTAT) SL tablet 0.4 mg (0.4 mg Sublingual Given 11/09/19 1137)  furosemide (LASIX) injection 40 mg (has no administration in time range)  sodium chloride flush (NS) 0.9 % injection 3 mL (has no administration in time range)  sodium chloride flush (NS) 0.9 % injection 3 mL (has no administration in time range)  0.9 %  sodium chloride infusion (has no administration in time range)  acetaminophen (TYLENOL) tablet 650 mg (has no administration in time range)  ondansetron (ZOFRAN) injection 4 mg (has no administration in time range)  heparin injection 5,000 Units (has no administration in time range)    ED Course  I have reviewed the triage vital signs and the nursing notes.  Pertinent labs & imaging results that were available during my care of the patient were reviewed by me and considered in my medical decision making (see chart for details).    MDM Rules/Calculators/A&P                          Henry [name] is a 86 y.o. male with a past medical history significant for CAD status post CABG, carotid disease, diabetes, hypertension, hypothyroidism, CHF, hypercholesterolemia, and prior anemia who presents with chills, productive cough, chest pain, shortness of breath, and fatigue.  Patient reports that for the last few days, he has been having intermittent chest discomfort.  He reports it as a severe pressure in his chest but is exertional in somewhat pleuritic.  He reports it is also a tightness.  He  reports he has had some chills, cough with a productive phlegm of a reddish-brown sputum.  He denies history of DVT or PE.  He denies any leg pain or leg swelling.  Does report feeling tired.  He denies injuries.  Denies nausea or vomiting does report feeling extremely  diaphoretic when his chest pain was severe this morning before calling for help.  He did take a nitro but he does not think it helped.  He thinks that the oxygen he was given with EMS and on arrival has helped his symptoms and breathing.  Reports he is vaccinated against COVID-19.  On exam, patient did not have any wheezing but did have some mild rhonchi.  Chest was nontender.  No murmur.  Abdomen nontender.  Palpable pulses in extremities.  No lower extremity tenderness or edema worse than baseline seen.  Patient maintaining oxygen saturations in the mid 90s on 2 L nasal cannula.  When oxygen was read initially, he started having coughing and shortness of breath although his sats did not seem to drop.  We will check this again.  Clinically I am concerned about either pulmonary embolism given the potential hemoptysis cough versus pneumonia versus Covid given the ongoing pandemic versus other.  Given his chest pain shortness breath and diaphoresis in the setting of prior CABG, we will get labs back and anticipate touching base with cardiology if it is concerning.  His EKG does seem similar to last January when it appears he had an STEMI.  It does look somewhat different than the last EKG performed.  He reports his chest pain has improved since being on oxygen and resting.  Anticipate reassessment after work-up to determine disposition.  3:15 PM I spoke with cards master who informed me that the consulting cardiology team request to be admitted to medicine for overall management.  We will await further recommendations in regards to heparinization or not.  I will also speak the admitting team about the utility of VQ scan given his fluid overload and elevated BNP.  Patient will be admitted to medicine service for further management of chest pain with rising troponin.    Final Clinical Impression(s) / ED Diagnoses Final diagnoses:  Precordial pain  Elevated troponin  Shortness of breath      Clinical Impression: 1. Precordial pain   2. Elevated troponin   3. Shortness of breath     Disposition: Admit  This note was prepared with assistance of Dragon voice recognition software. Occasional wrong-word or sound-a-like substitutions may have occurred due to the inherent limitations of voice recognition software.     Jassmin Kemmerer, Gwenyth Allegra, MD 11/09/19 1616

## 2019-11-09 NOTE — Telephone Encounter (Signed)
Sent to Dr. Newell. 

## 2019-11-09 NOTE — Telephone Encounter (Signed)
[facility] REPORT/CALL : ---Caller states pt. has c/o of dizziness and no energy since Friday. Pt. has a cough with a little bit of phlegm. No fever. Dizziness is worse when he stands. No chest pain or sob. No numbness or weakness. Pt. is alert and oriented.  Advised GO to ED now.

## 2019-11-09 NOTE — Telephone Encounter (Signed)
Patient's daughter called and stated that patient's was sent to hospital via EMS due to coughing up a lot of fleme and very dizzy and could not stand, and sweating. Patient currently in triage

## 2019-11-09 NOTE — Consult Note (Addendum)
Cardiology Admission History and Physical:   Patient ID: CARVEL HUSKINS MRN: [id number]; DOB: June 08, 1924   Admission date: 11/09/2019  Primary Care Provider: [name], MD [facility] Cardiologist: Sherren Mocha, MD  [facility] Electrophysiologist:  None   Chief Complaint:  Dizziness, productive cough  Patient Profile:   Henry [name] is a 86 y.o. male with CAD s/p CABG (2000), HFpEF with EF of 50-55%, moderate AS, 1st degree AV block, HTN, HLD, PAD, CKD4, T2DM who presents to the ED with c/o dizziness with SOB.  History of Present Illness:   Henry [name] presents today with 4-5 day history of dizziness that has gradually worsened. He states the dizziness begins as soon as he stands up and persists even after several minutes. In the past, symptoms would usually resolve. With the dizziness, he has also had productive cough with brown and possibly bloody phlegm. Symptoms are exacerbated by any exertion. Patient states before could walk to this mail box and back without SOB; now he struggles with walking between rooms. Alleviating factors include rest. He also feels better since receiving Nitro by EMS, however he has been laying down without any exertion since. He denies any chest pain or palpitations at this time. He denies any sick contacts. He denies fever, nausea, vomiting, abdominal pain, falls, LOC, extremity pain, extremity swelling. Henry [name] reports more diet indiscretions in the past couple weeks; stating he's been eating a "true [place] breakfast" of fried eggs, hash browns and sausage every other day or so. This is new for him.   In January 2020, patient was presented to [facility] with c/o progressive SOB that was exacerbated by exertion. He was noted to have significant elevated troponin and admitted for NSTEMI. Left heart cath was pursued and showed multi-vessel native coronary occlusion; LIMA-LAD and SVG-diag were both patient. The SVG-RCA and SVG-PDA were both  occluded, but chronically so with collaterals present. There was not a clear culprit. Medical management was recommended. Hospital stay was complicated by acute pulmonary edema 2/2 acute exacerbation of HFrEF in the setting of NSTEMI. TTE at the time showed normal EF of 50-55% with normal LV function. Patient was treated with IV diuresis. He was discharged on Aspirin, Plavix and Statin. Unable to tolerate BB due to 1st degree AV block.   Follow up TTE obtained in April of this year showing stable EF with mildly decreased LVF with RWMA. There was mild dilation of the LV with moderate LVH, as well as elevated LVEDP. Left atrium was dilated. Moderately elevated PASP. RV function intact.   Past Medical History:  Diagnosis Date  . Anemia   . CAD of autologous bypass graft   . Carotid artery disease ([place])   . Diabetes mellitus without complication ([place])   . DJD (degenerative joint disease)   . History of anemia of chronic disease   . Hypercholesteremia   . Hypertension   . NSTEMI (non-ST elevated myocardial infarction) ([place])   . PVD (peripheral vascular disease) with claudication ([place])   . Renal insufficiency   . Wears dentures   . Wears glasses    Past Surgical History:  Procedure Laterality Date  . A/V FISTULAGRAM N/A 07/15/2018   Procedure: A/V FISTULAGRAM - Left Arm;  Surgeon: Serafina Mitchell, MD;  Location: [place] [facility];  Service: Cardiovascular;  Laterality: N/A;  . AV FISTULA PLACEMENT Left 03/20/2018   Procedure: ARTERIOVENOUS (AV) FISTULA CREATION LEFT ARM;  Surgeon: Serafina Mitchell, MD;  Location: [place];  Service:  Vascular;  Laterality: Left;  . cataract surgery    . COLONOSCOPY     Hx: of  . CORONARY ARTERY BYPASS GRAFT  2000   by Dr. Cyndia Bent  . LEFT HEART CATH AND CORS/GRAFTS ANGIOGRAPHY N/A 03/27/2018   Procedure: LEFT HEART CATH AND CORS/GRAFTS ANGIOGRAPHY;  Surgeon: Sherren Mocha, MD;  Location: [place] [facility];  Service: Cardiovascular;  Laterality: N/A;  . LUMBAR  LAMINECTOMY/DECOMPRESSION MICRODISCECTOMY N/A 09/10/2012   Procedure: LUMBAR DECOMPRESSION,  IN SITU FUSION LUMBAR 4-5;  Surgeon: Melina Schools, MD;  Location: [place];  Service: Orthopedics;  Laterality: N/A;  . MULTIPLE TOOTH EXTRACTIONS    . TONSILLECTOMY    . UPPER EXTREMITY VENOGRAPHY Left 07/15/2018   Procedure: UPPER EXTREMITY VENOGRAPHY;  Surgeon: Serafina Mitchell, MD;  Location: [place] [facility];  Service: Cardiovascular;  Laterality: Left;  UPPER ARM     Medications Prior to Admission: Prior to Admission medications   Medication Sig Start Date End Date Taking? Authorizing Provider  amLODipine (NORVASC) 10 MG tablet TAKE 1 TABLET BY MOUTH ONCE DAILY IN THE EVENING 10/20/19   [name], MD  aspirin EC 81 MG tablet Take 81 mg by mouth daily.     [provider]  atorvastatin (LIPITOR) 40 MG tablet Take 1 tablet (40 mg total) by mouth every evening. 04/02/18   Mariel Aloe, MD  b complex-vitamin c-folic acid (NEPHRO-VITE) 0.8 MG TABS tablet Take 1 tablet by mouth at bedtime.    [provider]  calcitRIOL (ROCALTROL) 0.25 MCG capsule Take 0.25 mcg by mouth 2 (two) times a week.     [provider]  calcium acetate (PHOSLO) 667 MG capsule Take 667 mg by mouth 2 (two) times daily with a meal.    [provider]  clopidogrel (PLAVIX) 75 MG tablet Take 1 tablet (75 mg total) by mouth daily. 04/03/18   Mariel Aloe, MD  Darbepoetin Alfa (ARANESP) 100 MCG/0.5ML SOSY injection Inject 100 mcg into the skin every 30 (thirty) days.     [provider]  ferrous gluconate (FERGON) 325 MG tablet Take 325 mg by mouth daily.     [provider]  furosemide (LASIX) 40 MG tablet Take 40 mg by mouth daily.    [provider]  glucose blood (ACCU-CHEK AVIVA PLUS) test strip CHECK BLOOD SUGAR TWICE A DAY. E11.9 08/13/18   [name], MD  hydrALAZINE (APRESOLINE) 25 MG tablet Take 1 tablet (25 mg total) by mouth 2 (two) times daily. 08/13/18    [name], MD  Lancets (ACCU-CHEK MULTICLIX) lancets USE TWICE DAILY. 03/29/15   Noralee Space, MD  levothyroxine (SYNTHROID) 50 MCG tablet Take 50 mcg by mouth daily before breakfast.    [provider]  Multiple Vitamins-Minerals (PRESERVISION AREDS 2 PO) Take 1 tablet by mouth daily.    [provider]  traMADol (ULTRAM) 50 MG tablet Take 50 mg by mouth every 6 (six) hours as needed for moderate pain.    [provider]    Allergies:   No Known Allergies  Social History:   Social History   Socioeconomic History  . Marital status: Married    Spouse name: [name]  . Number of children: 5  . Years of education: Not on file  . Highest education level: Not on file  Occupational History  . Occupation: retired    Fish farm manager: RETIRED  Tobacco Use  . Smoking status: Former Smoker    Packs/day: 1.00  Years: 10.00    Pack years: 10.00    Types: Cigarettes    Quit date: 02/26/1953    Years since quitting: 66.7  . Smokeless tobacco: Never Used  Vaping Use  . Vaping Use: Never used  Substance and Sexual Activity  . Alcohol use: Not Currently    Alcohol/week: 0.0 standard drinks  . Drug use: No  . Sexual activity: Not on file  Other Topics Concern  . Not on file  Social History Narrative  . Not on file   Social Determinants of Health   Financial Resource Strain:   . Difficulty of Paying Living Expenses: Not on file  Food Insecurity:   . Worried About Charity fundraiser in the Last Year: Not on file  . Ran Out of Food in the Last Year: Not on file  Transportation Needs:   . Lack of Transportation (Medical): Not on file  . Lack of Transportation (Non-Medical): Not on file  Physical Activity:   . Days of Exercise per Week: Not on file  . Minutes of Exercise per Session: Not on file  Stress:   . Feeling of Stress : Not on file  Social Connections:   . Frequency of Communication with Friends and Family: Not on file  . Frequency of Social  Gatherings with Friends and Family: Not on file  . Attends Religious Services: Not on file  . Active Member of Clubs or Organizations: Not on file  . Attends Archivist Meetings: Not on file  . Marital Status: Not on file  Intimate Partner Violence:   . Fear of Current or Ex-Partner: Not on file  . Emotionally Abused: Not on file  . Physically Abused: Not on file  . Sexually Abused: Not on file    Family History:   The patient's family history includes Heart disease in his father and mother.    ROS:  Please see the history of present illness.  All other ROS reviewed and negative.     Physical Exam/Data:   Vitals:   11/09/19 1145 11/09/19 1200 11/09/19 1215 11/09/19 1300  BP: 125/63 (!) 143/66 (!) 145/68 (!) 148/64  Pulse: 79 77 84 77  Resp: (!) 21 19 (!) 22 (!) 21  Temp:      TempSrc:      SpO2: 100% 99% 98% 100%  Weight:      Height:       No intake or output data in the 24 hours ending 11/09/19 1417 Last 3 Weights 11/09/2019 08/13/2019 06/22/2019  Weight (lbs) 143 lb 148 lb 146 lb 12.8 oz  Weight (kg) 64.864 kg 67.132 kg 66.588 kg     Body mass index is 21.12 kg/m.   General:  Well nourished, well developed, in no acute distress HEENT: normal Lymph: no adenopathy Neck: no JVD Endocrine:  No thryomegaly Cardiac:  normal S1, inaudible S2; RRR; 3/6 late peaking systolic murmur heard best over RUSB. No other murmurs auscultated.  Lungs: Inspiratory crackles throughout all lung fields with the exception of the left upper lobe. No wheezing. No respiratory distress.   Abd: soft, nontender, no hepatomegaly  Ext: no edema Musculoskeletal:  No deformities, BUE and BLE strength normal and equal Skin: warm and dry  Neuro:  CNs 2-12 intact, no focal abnormalities noted Psych:  Normal affect   EKG:  The ECG that was done, was personally reviewed and demonstrates sinus rhythm with rate of 91. 1st degree AV block. Normal axis. LBBB.   Relevant CV  Studies:  TTE  (06/18/2019)  1. Mid and basal inferior/inferior lateral wall hypokinesis . Left  ventricular ejection fraction, by estimation, is 50 to 55%. The left  ventricle has low normal function. The left ventricle demonstrates  regional wall motion abnormalities (see scoring  diagram/findings for description). The left ventricular internal cavity  size was mildly dilated. There is moderate left ventricular hypertrophy.  Left ventricular diastolic parameters are consistent with Grade II  diastolic dysfunction  (pseudonormalization). Elevated left ventricular end-diastolic pressure.  2. Right ventricular systolic function is normal. The right ventricular  size is normal. There is moderately elevated pulmonary artery systolic  pressure.  3. Left atrial size was moderately dilated.  4. The mitral valve is normal in structure. Mild mitral valve  regurgitation. No evidence of mitral stenosis.  5. No significant change in gradients since January 2020 . The aortic  valve is tricuspid. Aortic valve regurgitation is mild. Moderate aortic  valve stenosis.  6. The inferior vena cava is normal in size with greater than 50%  respiratory variability, suggesting right atrial pressure of 3 mmHg.   Left Heart Cath (03/27/2018)   Ost RCA to Prox RCA lesion is 100% stenosed.  Origin to Prox Graft lesion is 100% stenosed.  Origin lesion is 100% stenosed.  Prox LAD lesion is 100% stenosed.  Ost 1st Mrg lesion is 90% stenosed.  1. Severe native 3 vessel disease with total occlusion of the RCA and LAD, severe stenosis of the left circumflex 2. S/P remote CABG with continued patency of the LIMA-LAD and SVG-diagonal, total occlusion of the SVG-RCA and SVG-PDA  Laboratory Data:  High Sensitivity Troponin:   Recent Labs  Lab 11/09/19 1006 11/09/19 1224  TROPONINIHS 439* 489*      Chemistry Recent Labs  Lab 11/09/19 1006  NA 135  K 4.1  CL 103  CO2 18*  GLUCOSE 132*  BUN 51*  CREATININE 3.35*    CALCIUM 9.3  GFRNONAA 15*  GFRAA 17*  ANIONGAP 14    Recent Labs  Lab 11/09/19 1006  PROT 7.0  ALBUMIN 3.6  AST 21  ALT 16  ALKPHOS 79  BILITOT 0.9   Hematology Recent Labs  Lab 11/09/19 1006  WBC 9.9  RBC 2.75*  HGB 8.4*  HCT 26.0*  MCV 94.5  MCH 30.5  MCHC 32.3  RDW 14.5  PLT 247   BNP Recent Labs  Lab 11/09/19 1006  BNP 979.3*    DDimer  Recent Labs  Lab 11/09/19 1006  DDIMER 1.32*   Radiology/Studies:  DG Chest Portable 1 View  Result Date: 11/09/2019 CLINICAL DATA:  Dizziness and cough.  Chest pain. EXAM: PORTABLE CHEST 1 VIEW COMPARISON:  Two-view chest x-ray 02/3118 FINDINGS: Heart is enlarged. Diffuse interstitial pattern is seen. Bilateral pleural effusions are present, right greater than left. Bibasilar airspace disease likely reflects atelectasis. Median sternotomy noted. IMPRESSION: 1. Cardiomegaly with interstitial edema and bilateral pleural effusions compatible with congestive heart failure. 2. Bibasilar airspace disease likely reflects atelectasis. Electronically Signed   By: San Morelle M.D.   On: 11/09/2019 10:33   Assessment and Plan:   # Severe AS - Patient's dizziness on exertion likely secondary to AS - Latest echo with evidence of moderate AS, severe thickening of the valve with severe calcification. Mean gradient measures 20.5 mmHg, peak gradient of 36.5 mmHg. LVOT/AV VTI of 0.24.  - Will need repeat echo to assess for interval worsening - Discussed extensively the timeline of AS and the eventual need for TAVR.  We discussed that pre-op requirements including CTA may lead to further kidney damage; patient is willing to undergo dialysis if absolutely required. Structural heart meeting is tomorrow; will discuss with Dr. Burt Knack.  - Encouraged to avoid exertional activities at this time.   # Acute on Chronic HFpEF:  - Symptoms of DOE with crackles on examination, pulmonary edema with pleural effusions on CXR and elevated BNP  consistent with acute heart failure exacerbation. Likely culprit of cough as well.  - Likely secondary to dietary indiscretions versus worsening of AS.  - Will need IV diuresis; previously had response with 40 mg IV, however given his CKD4, he may require require up-titration. Consider additional 40 mg IV tonight if inadequate UOP.  - Repeat TTE pending   # Troponemia - Troponin leak is minimal with a relatively flat increase - Likely due to demand in light of AoCHF and AS.  - Patient denies any CP. Given mildly elevated D-dimer and possibly mild hemoptosis, PE is on differential. However, patient is not a candidate for CTA at this time nor VQ scan (he has pulmonary edema on CXR now with hx of pulmonary fibrosis). He is not hypoxic nor tachycardic. Wells score of 1.  - Would not recommend full dose heparin at this time.   # CAD s/p CABG (2000) - Most recent left heart cath during admission for NSTEMI showed patent LIMA-LAD and SVG-diag. SVG-RCA and SVG-PDA were found to be chornically occluded with collaterals present. No culprit lesion could be identified at this time.  - He was been on Aspirin, Plavix and statin. Unable to tolerate BB due to AV block. ACEi/ARB/ARNI limited by CKD4.  - Risk Management: T2DM is well controlled; LDL is not quite at goal with most recent level of 82. He is on Atorvastatin 40 mg.  - Continue with current medical management.   # HTN - Continue home Amlodipine  - Hold Hydralazine in light of severe AS  # HLD - LDL is not quite at goal with most recent level of 82. He is on Atorvastatin 40 mg. - Consider increasing to 80 mg daily on discharge.   # CKD4  - Follows with Dr. Marval Regal at [place] at this time. No indication for dialysis.   For questions or updates, please contact [place] Please consult [email or web address] for contact info under   Signed, Dr. Jose Persia Internal Medicine PGY-2  11/09/2019, 5:15 PM   I have seen and  examined the patient along with Dr. Jose Persia.  I have reviewed the chart, notes and new data.  I agree with PA/NP's note.  Key new complaints: worsening exertional symptoms, now with acute HF exacerbation. Denies angina Key examination changes: very late peaking murmur of aortic stenosis; muffled S2, barely audible. Has an AV fistula that did not mature. Key new findings / data: reviewed echo from April, there was a suggestion of severe AS then (the valve is morphologically severely thickened and calcified; dimensionless index 0.24, AVA = 1 cm sq was probably overestimated since the LVOT was overmeasured at 2.35 cm). ECG shows SR, 1st deg AVB, LBBB. Minimal increase in troponin.  BNP higher than in the past. Creat 3.38, close to baseline (2.7-3.3, GFR 15-20). Hgb 8.4 is slightly lower than his baseline 9.5-10. CT abd/pelvis without contrast in 07/2018 showed "Moderate calcified plaque over the abdominal aorta and iliac arteries. ". LDL 82, but was 58-60 last year (reinforce compliance with diet and meds).  PLAN: I am concerned that  he has progressed to severe AS (with worsened impact due to CAD/mildly reduced LVEF, anemia, recent increased intake of salty foods). Option for TAVR with increased risk of complications due to renal insufficiency (he would consider HD, but AV fistula "failed to grow"). Repeat echo. Review in Structural Heart team meeting. Careful diuresis, hold hydralazine. No RAAS inhibitors.  Sanda Klein, MD, [place] [phone] 11/09/2019, 5:49 PM

## 2019-11-09 NOTE — Telephone Encounter (Signed)
The patient's daughter called to report the patient is in the ER. To Dr. Burt Knack as Juluis Rainier.

## 2019-11-09 NOTE — ED Notes (Signed)
Date and time results received: 11/09/19 1107  Test: Troponin Critical Value: 439  Name of Provider Notified: [place]

## 2019-11-09 NOTE — H&P (Signed)
History and Physical    Henry [name] ZOX:[id number] DOB: 1925-02-26 DOA: 11/09/2019  Referring MD/NP/PA: Marda Stalker, MD PCP: [name], MD  Consultants: Erich Montane, MD Nephrology-[facility]. [facility]  Patient coming from: Home via EMS  Chief Complaint: Dizziness and cough  I have personally briefly reviewed patient's old medical records in [place]   HPI: Henry [name] is a 86 y.o. male with medical history significant of HTN, HLD, CAD s/p CABG, moderate aortic stenosis, DM type II, and CKD stage IV presents with complaints of dizziness and cough.  History is obtained from the patient with assistance of his daughter who is present at bedside.  Symptoms have been present over the last week.  He has had this productive cough with brown sputum production that believe has blood in it.  He feels as though he did pass out whenever he stands, but has not lost consciousness or had any falls.  He reports having shortness of breath especially with walking to the mailbox and he has to stop and rest for 3 to 6 minutes to catch his breath.  Patient also reported having some centralized chest pressure that improved after receiving nitroglycerin with EMS.  He has not had any significant fever, leg swelling, calf pain, diarrhea,  abdominal pain, or dysuria symptoms.  His daughter notes that he had a similar episode back in January 2020 where he underwent a cardiac cath.  Catheter revealed severe native three-vessel disease with total occlusion of the RCA and LAD with severe stenosis of the left circumflex for which medical management was recommended has no targets for PCI.  They had previously attempted to place a fistula graft, but the graft never matured  ED Course: Upon admission into the emergency department patient was seen to be afebrile, respirations 16-26, blood pressures maintained, and currently on 2 L nasal cannula oxygen.  Labs are significant for BUN 51, creatinine  3.35, BNP 979.3, and troponin 439->489-> 613.  Chest x-ray showed cardiomegaly with interstitial edema and bilateral pleural effusions compatible with congestive heart failure.  Cardiology have been formally consulted.  TRH called to admit.  Review of Systems  Constitutional: Positive for malaise/fatigue. Negative for fever and weight loss.  HENT: Negative for congestion and nosebleeds.   Eyes: Negative for photophobia and pain.  Respiratory: Positive for cough, hemoptysis and sputum production.   Cardiovascular: Positive for chest pain. Negative for leg swelling.  Gastrointestinal: Negative for abdominal pain, diarrhea, nausea and vomiting.  Genitourinary: Negative for dysuria and hematuria.  Musculoskeletal: Negative for falls and myalgias.  Skin: Negative for itching.  Neurological: Positive for dizziness. Negative for loss of consciousness.  Psychiatric/Behavioral: Negative for memory loss and substance abuse.    Past Medical History:  Diagnosis Date  . Anemia   . CAD of autologous bypass graft   . Carotid artery disease ([place])   . Diabetes mellitus without complication ([place])   . DJD (degenerative joint disease)   . History of anemia of chronic disease   . Hypercholesteremia   . Hypertension   . NSTEMI (non-ST elevated myocardial infarction) ([place])   . PVD (peripheral vascular disease) with claudication ([place])   . Renal insufficiency   . Wears dentures   . Wears glasses     Past Surgical History:  Procedure Laterality Date  . A/V FISTULAGRAM N/A 07/15/2018   Procedure: A/V FISTULAGRAM - Left Arm;  Surgeon: Serafina Mitchell, MD;  Location: [place] [facility];  Service: Cardiovascular;  Laterality: N/A;  .  AV FISTULA PLACEMENT Left 03/20/2018   Procedure: ARTERIOVENOUS (AV) FISTULA CREATION LEFT ARM;  Surgeon: Serafina Mitchell, MD;  Location: [facility];  Service: Vascular;  Laterality: Left;  . cataract surgery    . COLONOSCOPY     Hx: of  . CORONARY ARTERY BYPASS GRAFT  2000   by  Dr. Cyndia Bent  . LEFT HEART CATH AND CORS/GRAFTS ANGIOGRAPHY N/A 03/27/2018   Procedure: LEFT HEART CATH AND CORS/GRAFTS ANGIOGRAPHY;  Surgeon: Sherren Mocha, MD;  Location: [place] [facility];  Service: Cardiovascular;  Laterality: N/A;  . LUMBAR LAMINECTOMY/DECOMPRESSION MICRODISCECTOMY N/A 09/10/2012   Procedure: LUMBAR DECOMPRESSION,  IN SITU FUSION LUMBAR 4-5;  Surgeon: Melina Schools, MD;  Location: [place];  Service: Orthopedics;  Laterality: N/A;  . MULTIPLE TOOTH EXTRACTIONS    . TONSILLECTOMY    . UPPER EXTREMITY VENOGRAPHY Left 07/15/2018   Procedure: UPPER EXTREMITY VENOGRAPHY;  Surgeon: Serafina Mitchell, MD;  Location: [place] [facility];  Service: Cardiovascular;  Laterality: Left;  UPPER ARM     reports that he quit smoking about 66 years ago. His smoking use included cigarettes. He has a 10.00 pack-year smoking history. He has never used smokeless tobacco. He reports previous alcohol use. He reports that he does not use drugs.  No Known Allergies  Family History  Problem Relation Age of Onset  . Heart disease Mother   . Heart disease Father     Prior to Admission medications   Medication Sig Start Date End Date Taking? Authorizing Provider  amLODipine (NORVASC) 10 MG tablet TAKE 1 TABLET BY MOUTH ONCE DAILY IN THE EVENING 10/20/19   [name], MD  aspirin EC 81 MG tablet Take 81 mg by mouth daily.     [provider]  atorvastatin (LIPITOR) 40 MG tablet Take 1 tablet (40 mg total) by mouth every evening. 04/02/18   Mariel Aloe, MD  b complex-vitamin c-folic acid (NEPHRO-VITE) 0.8 MG TABS tablet Take 1 tablet by mouth at bedtime.    [provider]  calcitRIOL (ROCALTROL) 0.25 MCG capsule Take 0.25 mcg by mouth 2 (two) times a week.     [provider]  calcium acetate (PHOSLO) 667 MG capsule Take 667 mg by mouth 2 (two) times daily with a meal.    [provider]  clopidogrel (PLAVIX) 75 MG tablet Take 1 tablet (75 mg total) by mouth  daily. 04/03/18   Mariel Aloe, MD  Darbepoetin Alfa (ARANESP) 100 MCG/0.5ML SOSY injection Inject 100 mcg into the skin every 30 (thirty) days.     [provider]  ferrous gluconate (FERGON) 325 MG tablet Take 325 mg by mouth daily.     [provider]  furosemide (LASIX) 40 MG tablet Take 40 mg by mouth daily.    [provider]  glucose blood (ACCU-CHEK AVIVA PLUS) test strip CHECK BLOOD SUGAR TWICE A DAY. E11.9 08/13/18   [name], MD  hydrALAZINE (APRESOLINE) 25 MG tablet Take 1 tablet (25 mg total) by mouth 2 (two) times daily. 08/13/18   [name], MD  Lancets (ACCU-CHEK MULTICLIX) lancets USE TWICE DAILY. 03/29/15   Noralee Space, MD  levothyroxine (SYNTHROID) 50 MCG tablet Take 50 mcg by mouth daily before breakfast.    [provider]  Multiple Vitamins-Minerals (PRESERVISION AREDS 2 PO) Take 1 tablet by mouth daily.    [provider]  traMADol (ULTRAM) 50 MG tablet Take 50 mg by mouth every 6 (six) hours as  needed for moderate pain.    [provider]    Physical Exam:  Constitutional: Elderly male who appears to be in no acute distress at this time. Vitals:   11/09/19 1215 11/09/19 1300 11/09/19 1400 11/09/19 1500  BP: (!) 145/68 (!) 148/64 (!) 153/64 (!) 154/64  Pulse: 84 77 78 74  Resp: (!) 22 (!) 21 18 16   Temp:      TempSrc:      SpO2: 98% 100% 98% 99%  Weight:      Height:       Eyes: PERRL, lids and conjunctivae normal ENMT: Mucous membranes are moist. Posterior pharynx clear of any exudate or lesions.    Neck: normal, supple, no masses, no thyromegaly Respiratory: Crackles appreciated in both lung fields.  O2 saturation currently maintained on 2 L nasal cannula oxygen. Cardiovascular: Regular rate and rhythm with positive systolic ejection murmur appreciated. No extremity edema. 2+ pedal pulses. No carotid bruits.  Abdomen: no tenderness, no masses palpated. No hepatosplenomegaly. Bowel sounds positive.    Musculoskeletal: no clubbing / cyanosis. No joint deformity upper and lower extremities. Good ROM, no contractures. Normal muscle tone.  Skin: no rashes, lesions, ulcers. No induration Neurologic: CN 2-12 grossly intact. Sensation intact, DTR normal. Strength 5/5 in all 4.  Psychiatric: Normal judgment and insight. Alert and oriented x 3. Normal mood.     Labs on Admission: I have personally reviewed following labs and imaging studies  CBC: Recent Labs  Lab 11/09/19 1006  WBC 9.9  NEUTROABS 7.6  HGB 8.4*  HCT 26.0*  MCV 94.5  PLT 284   Basic Metabolic Panel: Recent Labs  Lab 11/09/19 1006  NA 135  K 4.1  CL 103  CO2 18*  GLUCOSE 132*  BUN 51*  CREATININE 3.35*  CALCIUM 9.3  MG 2.1   GFR: Estimated Creatinine Clearance: 12.6 mL/min (A) (by C-G formula based on SCr of 3.35 mg/dL (H)). Liver Function Tests: Recent Labs  Lab 11/09/19 1006  AST 21  ALT 16  ALKPHOS 79  BILITOT 0.9  PROT 7.0  ALBUMIN 3.6   Recent Labs  Lab 11/09/19 1006  LIPASE 24   No results for input(s): AMMONIA in the last 168 hours. Coagulation Profile: No results for input(s): INR, PROTIME in the last 168 hours. Cardiac Enzymes: No results for input(s): CKTOTAL, CKMB, CKMBINDEX, TROPONINI in the last 168 hours. BNP (last 3 results) No results for input(s): PROBNP in the last 8760 hours. HbA1C: No results for input(s): HGBA1C in the last 72 hours. CBG: No results for input(s): GLUCAP in the last 168 hours. Lipid Profile: No results for input(s): CHOL, HDL, LDLCALC, TRIG, CHOLHDL, LDLDIRECT in the last 72 hours. Thyroid Function Tests: Recent Labs    11/09/19 1019  TSH 4.697*   Anemia Panel: No results for input(s): VITAMINB12, FOLATE, FERRITIN, TIBC, IRON, RETICCTPCT in the last 72 hours. Urine analysis:    Component Value Date/Time   COLORURINE YELLOW 02/12/2019 1215   APPEARANCEUR CLEAR 02/12/2019 1215   LABSPEC 1.015 02/12/2019 1215   PHURINE 6.5 02/12/2019 1215    GLUCOSEU NEGATIVE 02/12/2019 1215   HGBUR NEGATIVE 02/12/2019 1215   [place] 02/12/2019 1215   KETONESUR NEGATIVE 02/12/2019 1215   PROTEINUR 100 (A) 04/10/2012 1929   UROBILINOGEN 0.2 02/12/2019 1215   NITRITE NEGATIVE 02/12/2019 1215   LEUKOCYTESUR NEGATIVE 02/12/2019 1215   Sepsis Labs: Recent Results (from the past 240 hour(s))  SARS Coronavirus 2 by RT PCR (hospital order, performed in [facility] hospital lab) Nasopharyngeal Nasopharyngeal Swab     Status: None   Collection Time: 11/09/19 10:03 AM   Specimen: Nasopharyngeal Swab  Result Value Ref Range Status   SARS Coronavirus 2 NEGATIVE NEGATIVE Final    Comment: (NOTE) SARS-CoV-2 target nucleic acids are NOT DETECTED.  The SARS-CoV-2 RNA is generally detectable in upper and lower respiratory specimens during the acute phase of infection. The lowest concentration of SARS-CoV-2 viral copies this assay can detect is 250 copies / mL. A negative result does not preclude SARS-CoV-2 infection and should not be used as the sole basis for treatment or other patient management decisions.  A negative result may occur with improper specimen collection / handling, submission of specimen other than nasopharyngeal swab, presence of viral mutation(s) within the areas targeted by this assay, and inadequate number of viral copies (<250 copies / mL). A negative result must be combined with clinical observations, patient history, and epidemiological information.  Fact Sheet for Patients:   StrictlyIdeas.no  Fact Sheet for Healthcare Providers: BankingDealers.co.za  This test is not yet approved or  cleared by the [place] FDA and has been authorized for detection and/or diagnosis of SARS-CoV-2 by FDA under an Emergency Use Authorization (EUA).  This EUA will remain in effect (meaning this test can be used) for the duration of the COVID-19 declaration under Section 564(b)(1)  of the Act, 21 U.S.C. section 360bbb-3(b)(1), unless the authorization is terminated or revoked sooner.  Performed at [facility], [place] [address], [place], [place] [address]      Radiological Exams on Admission: DG Chest Portable 1 View  Result Date: 11/09/2019 CLINICAL DATA:  Dizziness and cough.  Chest pain. EXAM: PORTABLE CHEST 1 VIEW COMPARISON:  Two-view chest x-ray 02/3118 FINDINGS: Heart is enlarged. Diffuse interstitial pattern is seen. Bilateral pleural effusions are present, right greater than left. Bibasilar airspace disease likely reflects atelectasis. Median sternotomy noted. IMPRESSION: 1. Cardiomegaly with interstitial edema and bilateral pleural effusions compatible with congestive heart failure. 2. Bibasilar airspace disease likely reflects atelectasis. Electronically Signed   By: San Morelle M.D.   On: 11/09/2019 10:33    EKG: Independently reviewed.  Sinus rhythm at 81 bpm with similar findings to previous EKG  Assessment/Plan  Diastolic congestive heart failure exacerbation:Acute on chronic.   Patient presents with complaints of cough and shortness of breath..  Chest x-ray showing cardiomegaly with interstitial edema and bilateral pleural effusions patible with a CHF exacerbation.  BNP was elevated at 979.3.  Last EF noted to be 50-55% with grade 2 diastolic dysfunction on 9/79/8921.  -Admit to a cardiac telemetry bed -Heart failure orders set  initiated  -Continuous pulse oximetry with nasal cannula oxygen as needed to keep O2 saturations >92% -Strict I&Os and daily weights -Elevate lower extremities -Lasix 40 mg IV x1 dose -Reassess in a.m. and adjust diuresis as needed. -Check echocardiogram -Appreciate cardiology consultative services, we will follow-up for any further recommendations  Elevated troponin Coronary artery disease s/p CABG: Acute.  On admission high-sensitivity troponins elevated 439->489-> 613.  Patient complained of chest pain that  was relieved with nitroglycerin.  Last cardiac cath in 02/2018 showed severe three-vessel disease, but was not amenable to PCI. -Follow-up echocardiogram -Continue ASA, Plavix, and statin  Acute kidney injury superimposed on chronic kidney disease IV: Patient's baseline creatinine had been around 3 on 08/13/2019.  Patient presents with creatinine elevated up to 3.35 with BUN 51.  Suspect hypoperfusion due to congestive heart failure.  Patient followed in  outpatient setting by Dr. Marval Regal of nephrology. -Continue to monitor kidney function daily while trying to diurese the patient  Aortic stenosis -Follow-up repeat echocardiogram -Per cardiology  Anemia of chronic kidney disease: On admission hemoglobin as low as 8.4.  Patient baseline hemoglobin previously noted to be around 9-10.  There was concern for possible hemoptysis, but suspect secondary to pulmonary edema.  Vital signs are otherwise stable. -Type and screen for possible need of blood product -Recheck H&H in a.m.  Essential hypertension: Patient's blood pressures appear relatively stable.  Home blood pressure medications include amlodipine 10 mg daily, furosemide 40 mg daily, and hydralazine 25 mg twice daily. -Hold hydralazine due to aortic stenosis -Continue amlodipine   Diabetes mellitus type 2: Well-controlled as last hemoglobin A1c noted to be 5.8 on 08/13/2019.   -Continue to monitor  Hypothyroidism: TSH noted to be 4.697. -Continue levothyroxine  Hyperlipidemia -Continue statin  DNR: Present on admission.  Patient agreed that if his heart were to stop and he was found to be not breathing that he would not like to be resuscitated.  His daughter is present at bedside and agrees with this.  However, noted that if patient undergoing a procedure they would like this lifted for the procedure and patient to be a full code at least temporarily.    DVT prophylaxis: Heparin Code Status: DNR Family Communication: Plan of care  discussed with patient daughter present at bedside Disposition Plan: To be determined Consults called: Cardiology Admission status: Inpatient  Norval Morton MD [facility] Pager [phone]   If 7PM-7AM, please contact night-coverage [email or web address] Password [facility]  11/09/2019, 3:34 PM

## 2019-11-10 ENCOUNTER — Encounter (HOSPITAL_COMMUNITY): Payer: Self-pay | Admitting: Internal Medicine

## 2019-11-10 ENCOUNTER — Inpatient Hospital Stay (HOSPITAL_COMMUNITY): Payer: Medicare Other

## 2019-11-10 DIAGNOSIS — I351 Nonrheumatic aortic (valve) insufficiency: Secondary | ICD-10-CM

## 2019-11-10 DIAGNOSIS — I350 Nonrheumatic aortic (valve) stenosis: Secondary | ICD-10-CM

## 2019-11-10 DIAGNOSIS — I340 Nonrheumatic mitral (valve) insufficiency: Secondary | ICD-10-CM

## 2019-11-10 LAB — ECHOCARDIOGRAM COMPLETE
AR max vel: 0.91 cm2
AV Area VTI: 0.92 cm2
AV Area mean vel: 0.95 cm2
AV Mean grad: 18 mmHg
AV Peak grad: 32.9 mmHg
Ao pk vel: 2.87 m/s
Area-P 1/2: 5.54 cm2
Height: 69 in
P 1/2 time: 225 msec
S' Lateral: 4.4 cm
Weight: 2180.8 oz

## 2019-11-10 LAB — CBC
HCT: 25.3 % — ABNORMAL LOW (ref 39.0–52.0)
Hemoglobin: 8.3 g/dL — ABNORMAL LOW (ref 13.0–17.0)
MCH: 30.3 pg (ref 26.0–34.0)
MCHC: 32.8 g/dL (ref 30.0–36.0)
MCV: 92.3 fL (ref 80.0–100.0)
Platelets: 237 10*3/uL (ref 150–400)
RBC: 2.74 MIL/uL — ABNORMAL LOW (ref 4.22–5.81)
RDW: 14.5 % (ref 11.5–15.5)
WBC: 7.6 10*3/uL (ref 4.0–10.5)
nRBC: 0 % (ref 0.0–0.2)

## 2019-11-10 LAB — BASIC METABOLIC PANEL
Anion gap: 12 (ref 5–15)
BUN: 55 mg/dL — ABNORMAL HIGH (ref 8–23)
CO2: 18 mmol/L — ABNORMAL LOW (ref 22–32)
Calcium: 9.4 mg/dL (ref 8.9–10.3)
Chloride: 104 mmol/L (ref 98–111)
Creatinine, Ser: 3.39 mg/dL — ABNORMAL HIGH (ref 0.61–1.24)
GFR calc Af Amer: 17 mL/min — ABNORMAL LOW (ref >60)
GFR calc non Af Amer: 15 mL/min — ABNORMAL LOW (ref >60)
Glucose, Bld: 117 mg/dL — ABNORMAL HIGH (ref 70–99)
Potassium: 3.8 mmol/L (ref 3.5–5.1)
Sodium: 134 mmol/L — ABNORMAL LOW (ref 135–145)

## 2019-11-10 MED ORDER — FUROSEMIDE 10 MG/ML IJ SOLN
40.0000 mg | Freq: Once | INTRAMUSCULAR | Status: AC
  Administered 2019-11-10: 40 mg via INTRAVENOUS
  Filled 2019-11-10: qty 4

## 2019-11-10 MED ORDER — POTASSIUM CHLORIDE CRYS ER 20 MEQ PO TBCR
20.0000 meq | EXTENDED_RELEASE_TABLET | Freq: Once | ORAL | Status: AC
  Administered 2019-11-10: 20 meq via ORAL
  Filled 2019-11-10: qty 1

## 2019-11-10 NOTE — Progress Notes (Signed)
PROGRESS NOTE    Henry [name]  YSA:[id number] DOB: August 22, 1924 DOA: 11/09/2019 PCP: [name], MD    Brief Narrative:  86 y.o. male with medical history significant of HTN, HLD, CAD s/p CABG, moderate aortic stenosis, DM type II, and CKD stage IV presents with complaints of dizziness and cough.  History is obtained from the patient with assistance of his daughter who is present at bedside.  Symptoms have been present over the last week.  He has had this productive cough with brown sputum production that believe has blood in it.  He feels as though he did pass out whenever he stands, but has not lost consciousness or had any falls.  He reports having shortness of breath especially with walking to the mailbox and he has to stop and rest for 3 to 6 minutes to catch his breath.  Patient also reported having some centralized chest pressure that improved after receiving nitroglycerin with EMS.  He has not had any significant fever, leg swelling, calf pain, diarrhea,  abdominal pain, or dysuria symptoms.  His daughter notes that he had a similar episode back in January 2020 where he underwent a cardiac cath.  Catheter revealed severe native three-vessel disease with total occlusion of the RCA and LAD with severe stenosis of the left circumflex for which medical management was recommended has no targets for PCI.  They had previously attempted to place a fistula graft, but the graft never matured  ED Course: Upon admission into the emergency department patient was seen to be afebrile, respirations 16-26, blood pressures maintained, and currently on 2 L nasal cannula oxygen.  Labs are significant for BUN 51, creatinine 3.35, BNP 979.3, and troponin 439->489-> 613.  Chest x-ray showed cardiomegaly with interstitial edema and bilateral pleural effusions compatible with congestive heart failure.  Cardiology have been formally consulted.  TRH called to admit.  Assessment & Plan:   Principal Problem:   Acute  on chronic congestive heart failure (HCC) Active Problems:   Anemia of chronic disease   CAD (coronary artery disease)   Hypothyroidism   Aortic valve stenosis, unspecified etiology   Hypertension   Elevated troponin   DNR (do not resuscitate)  Diastolic congestive heart failure exacerbation:Acute on chronic. Patient presents with complaints of cough and shortness of breath..  Chest x-ray showing cardiomegaly with interstitial edema and bilateral pleural effusions patible with a CHF exacerbation.  BNP was elevated at 979.3.  Last EF noted to be 50-55% with grade 2 diastolic dysfunction on 05/19/5571.  -Appreciate Cardiology assistance. Receiving intermittent IV lasix with good results -See below. With progressive aortic stenosis to stage D1 severe disease with renal involvement, options are limited and plan is to focus on palliation. Palliative Care consulted  Elevated troponin Coronary artery disease s/p CABG: Acute.  On admission high-sensitivity troponins elevated 439->489-> 613.   -Last cardiac cath in 02/2018 showed severe three-vessel disease, but was not amenable to PCI. -Continue ASA, Plavix, and statin -Denies chest pain this AM  Acute kidney injury superimposed on chronic kidney disease IV: Patient's baseline creatinine had been around 3 on 08/13/2019.  Patient presents with creatinine elevated up to 3.35 with BUN 51.  Suspect hypoperfusion due to congestive heart failure.  Patient followed in outpatient setting by Dr. Marval Regal of nephrology. -Continue to monitor kidney function daily while trying to diurese the patient  Aortic stenosis -Progression to D1 severe disease noted per Cardiology -Pt is not candidate for TAVR secondary to high risk for progressing to  ESRD with no HD access available -Palliative Care recommended per Cardiology after consultation with nephrology  Anemia of chronic kidney disease: On admission hemoglobin as low as 8.4.  Patient baseline hemoglobin  previously noted to be around 9-10.  There was concern for possible hemoptysis, but suspect secondary to pulmonary edema.  Vital signs are otherwise stable. -Pt has remained hemodynamically stable  Essential hypertension: Patient's blood pressures appear relatively stable.  Home blood pressure medications include amlodipine 10 mg daily, furosemide 40 mg daily, and hydralazine 25 mg twice daily. -Held hydralazine due to aortic stenosis -Continue amlodipine   Diabetes mellitus type 2: Well-controlled as last hemoglobin A1c noted to be 5.8 on 08/13/2019.   -Continue to monitor  Hypothyroidism: TSH noted to be 4.697. -Continue levothyroxine as tolerated  Hyperlipidemia -Continue statin  DVT prophylaxis: SCD Code Status: DNR Family Communication: Pt in room, daughter  Status is: Inpatient  Remains inpatient appropriate because:Unsafe d/c plan and Inpatient level of care appropriate due to severity of illness   Dispo: The patient is from: Home              Anticipated d/c is to: Unclear at this time              Anticipated d/c date is: 2 days              Patient currently is not medically stable to d/c.   Consultants:   Cardiology  Palliative Care  Procedures:   2d echo  Antimicrobials: Anti-infectives (From admission, onward)   None       Subjective: Without complaints this time  Objective: Vitals:   11/09/19 1741 11/09/19 2052 11/09/19 2329 11/10/19 0435  BP: (!) 168/71 (!) 137/57 125/63 137/66  Pulse: 86 80 89 88  Resp: 17 18 (!) 23 16  Temp: 98 F (36.7 C) 97.7 F (36.5 C) 99.1 F (37.3 C) 98.8 F (37.1 C)  TempSrc: Oral Oral Oral Oral  SpO2: 99% 99% 94% 94%  Weight:    61.8 kg  Height: 5\' 9"  (1.753 m)       Intake/Output Summary (Last 24 hours) at 11/10/2019 1553 Last data filed at 11/10/2019 0502 Gross per 24 hour  Intake 240 ml  Output 950 ml  Net -710 ml   Filed Weights   11/09/19 1010 11/10/19 0435  Weight: 64.9 kg 61.8 kg     Examination:  General exam: Appears calm and comfortable  Respiratory system: Clear to auscultation. Respiratory effort normal. Cardiovascular system: S1 & S2 heard, Regular Gastrointestinal system: Abdomen is nondistended, soft and nontender. No organomegaly or masses felt. Normal bowel sounds heard. Central nervous system: Alert and oriented. No focal neurological deficits. Extremities: Symmetric 5 x 5 power. Skin: No rashes, lesions Psychiatry: Judgement and insight appear normal. Mood & affect appropriate.   Data Reviewed: I have personally reviewed following labs and imaging studies  CBC: Recent Labs  Lab 11/09/19 1006 11/10/19 0507  WBC 9.9 7.6  NEUTROABS 7.6  --   HGB 8.4* 8.3*  HCT 26.0* 25.3*  MCV 94.5 92.3  PLT 247 932   Basic Metabolic Panel: Recent Labs  Lab 11/09/19 1006 11/10/19 0507  NA 135 134*  K 4.1 3.8  CL 103 104  CO2 18* 18*  GLUCOSE 132* 117*  BUN 51* 55*  CREATININE 3.35* 3.39*  CALCIUM 9.3 9.4  MG 2.1  --    GFR: Estimated Creatinine Clearance: 11.9 mL/min (A) (by C-G formula based on SCr of 3.39 mg/dL (H)). Liver  Function Tests: Recent Labs  Lab 11/09/19 1006  AST 21  ALT 16  ALKPHOS 79  BILITOT 0.9  PROT 7.0  ALBUMIN 3.6   Recent Labs  Lab 11/09/19 1006  LIPASE 24   No results for input(s): AMMONIA in the last 168 hours. Coagulation Profile: No results for input(s): INR, PROTIME in the last 168 hours. Cardiac Enzymes: No results for input(s): CKTOTAL, CKMB, CKMBINDEX, TROPONINI in the last 168 hours. BNP (last 3 results) No results for input(s): PROBNP in the last 8760 hours. HbA1C: No results for input(s): HGBA1C in the last 72 hours. CBG: No results for input(s): GLUCAP in the last 168 hours. Lipid Profile: No results for input(s): CHOL, HDL, LDLCALC, TRIG, CHOLHDL, LDLDIRECT in the last 72 hours. Thyroid Function Tests: Recent Labs    11/09/19 1019  TSH 4.697*   Anemia Panel: No results for input(s):  VITAMINB12, FOLATE, FERRITIN, TIBC, IRON, RETICCTPCT in the last 72 hours. Sepsis Labs: Recent Labs  Lab 11/09/19 1019  LATICACIDVEN 1.2    Recent Results (from the past 240 hour(s))  SARS Coronavirus 2 by RT PCR (hospital order, performed in [facility] hospital lab) Nasopharyngeal Nasopharyngeal Swab     Status: None   Collection Time: 11/09/19 10:03 AM   Specimen: Nasopharyngeal Swab  Result Value Ref Range Status   SARS Coronavirus 2 NEGATIVE NEGATIVE Final    Comment: (NOTE) SARS-CoV-2 target nucleic acids are NOT DETECTED.  The SARS-CoV-2 RNA is generally detectable in upper and lower respiratory specimens during the acute phase of infection. The lowest concentration of SARS-CoV-2 viral copies this assay can detect is 250 copies / mL. A negative result does not preclude SARS-CoV-2 infection and should not be used as the sole basis for treatment or other patient management decisions.  A negative result may occur with improper specimen collection / handling, submission of specimen other than nasopharyngeal swab, presence of viral mutation(s) within the areas targeted by this assay, and inadequate number of viral copies (<250 copies / mL). A negative result must be combined with clinical observations, patient history, and epidemiological information.  Fact Sheet for Patients:   StrictlyIdeas.no  Fact Sheet for Healthcare Providers: BankingDealers.co.za  This test is not yet approved or  cleared by the [place] FDA and has been authorized for detection and/or diagnosis of SARS-CoV-2 by FDA under an Emergency Use Authorization (EUA).  This EUA will remain in effect (meaning this test can be used) for the duration of the COVID-19 declaration under Section 564(b)(1) of the Act, 21 U.S.C. section 360bbb-3(b)(1), unless the authorization is terminated or revoked sooner.  Performed at [facility], [place] [address]  [place], [place] [address]      Radiology Studies: DG Chest Portable 1 View  Result Date: 11/09/2019 CLINICAL DATA:  Dizziness and cough.  Chest pain. EXAM: PORTABLE CHEST 1 VIEW COMPARISON:  Two-view chest x-ray 02/3118 FINDINGS: Heart is enlarged. Diffuse interstitial pattern is seen. Bilateral pleural effusions are present, right greater than left. Bibasilar airspace disease likely reflects atelectasis. Median sternotomy noted. IMPRESSION: 1. Cardiomegaly with interstitial edema and bilateral pleural effusions compatible with congestive heart failure. 2. Bibasilar airspace disease likely reflects atelectasis. Electronically Signed   By: San Morelle M.D.   On: 11/09/2019 10:33   ECHOCARDIOGRAM COMPLETE  Result Date: 11/10/2019    ECHOCARDIOGRAM REPORT   Patient Name:   Henry [name] [facility] Date of Exam: 11/10/2019 Medical Rec #:  [id number]        Height:  69.0 in Accession #:    [id number]       Weight:       136.3 lb Date of Birth:  1924/04/10         BSA:          1.755 m Patient Age:    40 years         BP:           146/62 mmHg Patient Gender: M                HR:           86 bpm. Exam Location:  Inpatient Procedure: 2D Echo, Cardiac Doppler and Color Doppler Indications:    Elevated Troponin.  History:        Patient has prior history of Echocardiogram examinations, most                 recent 06/18/2019. Previous Myocardial Infarction and CAD,                 Carotid Disease; Risk Factors:Hypertension, Diabetes and                 Dyslipidemia.  Sonographer:    Tiffany Dance Referring Phys: [id number] RONDELL A SMITH IMPRESSIONS  1. Left ventricular ejection fraction, by estimation, is 35 to 40%. The left ventricle has moderately decreased function. The left ventricle demonstrates global hypokinesis. Left ventricular diastolic function could not be evaluated.  2. Right ventricular systolic function is mildly reduced. The right ventricular size is normal. There is normal pulmonary artery systolic  pressure.  3. Left atrial size was mildly dilated.  4. The mitral valve is normal in structure. Mild mitral valve regurgitation. No evidence of mitral stenosis.  5. The aortic valve is tricuspid. There is severe calcifcation of the aortic valve. There is moderate thickening of the aortic valve. Aortic valve regurgitation is moderate. Moderate aortic valve stenosis. Aortic valve area, by VTI measures 0.92 cm. Aortic valve mean gradient measures 18.0 mmHg. Aortic valve Vmax measures 2.87 m/s. The dimensionless index is low at 0.26 and consistent with more severe AS. SV index is reduced at 31. The AV mean gradient is likely underestimated in the setting of LV dysfunction and this likely represents at least moderate to severe low flow low gradient aortic stenosis.  6. Aortic dilatation noted. There is borderline dilatation of the aortic root, measuring 37 mm. There is dilatation of the ascending aorta, measuring 38 mm.  7. The inferior vena cava is normal in size with greater than 50% respiratory variability, suggesting right atrial pressure of 3 mmHg. FINDINGS  Left Ventricle: Left ventricular ejection fraction, by estimation, is 35 to 40%. The left ventricle has moderately decreased function. The left ventricle demonstrates global hypokinesis. The left ventricular internal cavity size was normal in size. There is no left ventricular hypertrophy. Left ventricular diastolic function could not be evaluated. Right Ventricle: The right ventricular size is normal. No increase in right ventricular wall thickness. Right ventricular systolic function is mildly reduced. There is normal pulmonary artery systolic pressure. The tricuspid regurgitant velocity is 2.52 m/s, and with an assumed right atrial pressure of 3 mmHg, the estimated right ventricular systolic pressure is 96.2 mmHg. Left Atrium: Left atrial size was mildly dilated. Right Atrium: Right atrial size was normal in size. Pericardium: There is no evidence of  pericardial effusion. Mitral Valve: The mitral valve is normal in structure. There is mild thickening of the anterior mitral valve leaflet(s).  There is mild calcification of the anterior mitral valve leaflet(s). Mild to moderate mitral annular calcification. Mild mitral valve  regurgitation. No evidence of mitral valve stenosis. Tricuspid Valve: The tricuspid valve is normal in structure. Tricuspid valve regurgitation is trivial. No evidence of tricuspid stenosis. Aortic Valve: The aortic valve is tricuspid. There is severe calcifcation of the aortic valve. There is moderate thickening of the aortic valve. Aortic valve regurgitation is moderate. Aortic regurgitation PHT measures 225 msec. Moderate to severe aortic  stenosis is present. Aortic valve mean gradient measures 18.0 mmHg. Aortic valve peak gradient measures 32.9 mmHg. Aortic valve area, by VTI measures 0.92 cm. Pulmonic Valve: The pulmonic valve was normal in structure. Pulmonic valve regurgitation is not visualized. No evidence of pulmonic stenosis. Aorta: Aortic dilatation noted. There is borderline dilatation of the aortic root, measuring 37 mm. There is dilatation of the ascending aorta, measuring 38 mm. Venous: The inferior vena cava is normal in size with greater than 50% respiratory variability, suggesting right atrial pressure of 3 mmHg. IAS/Shunts: No atrial level shunt detected by color flow Doppler.  LEFT VENTRICLE PLAX 2D LVIDd:         5.10 cm LVIDs:         4.40 cm LV PW:         1.20 cm LV IVS:        0.90 cm LVOT diam:     2.10 cm LV SV:         55 LV SV Index:   31 LVOT Area:     3.46 cm  RIGHT VENTRICLE RV Basal diam:  2.70 cm RV Mid diam:    1.90 cm RV S prime:     8.78 cm/s TAPSE (M-mode): 1.4 cm LEFT ATRIUM             Index       RIGHT ATRIUM           Index LA diam:        4.80 cm 2.73 cm/m  RA Area:     14.70 cm LA Vol (A2C):   73.7 ml 41.99 ml/m RA Volume:   31.50 ml  17.95 ml/m LA Vol (A4C):   71.5 ml 40.73 ml/m LA Biplane  Vol: 73.8 ml 42.04 ml/m  AORTIC VALVE AV Area (Vmax):    0.91 cm AV Area (Vmean):   0.95 cm AV Area (VTI):     0.92 cm AV Vmax:           287.00 cm/s AV Vmean:          196.333 cm/s AV VTI:            0.602 m AV Peak Grad:      32.9 mmHg AV Mean Grad:      18.0 mmHg LVOT Vmax:         75.20 cm/s LVOT Vmean:        53.700 cm/s LVOT VTI:          0.159 m LVOT/AV VTI ratio: 0.26 AI PHT:            225 msec  AORTA Ao Root diam: 3.70 cm Ao Asc diam:  3.80 cm MITRAL VALVE                TRICUSPID VALVE MV Area (PHT): 5.54 cm     TR Peak grad:   25.4 mmHg MV Decel Time: 137 msec     TR Vmax:  252.00 cm/s MV E velocity: 132.00 cm/s MV A velocity: 124.00 cm/s  SHUNTS MV E/A ratio:  1.06         Systemic VTI:  0.16 m                             Systemic Diam: 2.10 cm Fransico Him MD Electronically signed by Fransico Him MD Signature Date/Time: 11/10/2019/3:04:25 PM    Final     Scheduled Meds: . amLODipine  10 mg Oral QPM  . aspirin EC  81 mg Oral Daily  . atorvastatin  40 mg Oral QPM  . calcitRIOL  0.25 mcg Oral Weekly  . calcium acetate  667 mg Oral BID WC  . influenza vaccine adjuvanted  0.5 mL Intramuscular Tomorrow-1000  . levothyroxine  50 mcg Oral QAC breakfast  . multivitamin  1 tablet Oral QHS  . sodium chloride flush  3 mL Intravenous Q12H   Continuous Infusions: . sodium chloride       LOS: 1 day   Marylu Lund, MD [facility] Pager On Amion  If 7PM-7AM, please contact night-coverage 11/10/2019, 3:53 PM

## 2019-11-10 NOTE — Progress Notes (Signed)
Progress Note STRUCTURAL HEART NOTE Patient Name: Henry [name] Date of Encounter: 11/10/2019  [facility] Cardiologist: Sherren Mocha, MD   Subjective   The patient is very well-known to me from the outpatient setting.  His daughter is here at the bedside.  He remains dizzy and has shortness of breath with activity, with some improvement since admission.  He had some chest discomfort in the center of his chest at the time of admission but none presently.  Inpatient Medications    Scheduled Meds:  amLODipine  10 mg Oral QPM   aspirin EC  81 mg Oral Daily   atorvastatin  40 mg Oral QPM   calcitRIOL  0.25 mcg Oral Weekly   calcium acetate  667 mg Oral BID WC   influenza vaccine adjuvanted  0.5 mL Intramuscular Tomorrow-1000   levothyroxine  50 mcg Oral QAC breakfast   multivitamin  1 tablet Oral QHS   sodium chloride flush  3 mL Intravenous Q12H   Continuous Infusions:  sodium chloride     PRN Meds: sodium chloride, acetaminophen, nitroGLYCERIN, ondansetron (ZOFRAN) IV, sodium chloride flush   Vital Signs    Vitals:   11/09/19 1741 11/09/19 2052 11/09/19 2329 11/10/19 0435  BP: (!) 168/71 (!) 137/57 125/63 137/66  Pulse: 86 80 89 88  Resp: 17 18 (!) 23 16  Temp: 98 F (36.7 C) 97.7 F (36.5 C) 99.1 F (37.3 C) 98.8 F (37.1 C)  TempSrc: Oral Oral Oral Oral  SpO2: 99% 99% 94% 94%  Weight:    61.8 kg  Height: 5\' 9"  (1.753 m)       Intake/Output Summary (Last 24 hours) at 11/10/2019 1220 Last data filed at 11/10/2019 0502 Gross per 24 hour  Intake 240 ml  Output 950 ml  Net -710 ml   Last 3 Weights 11/10/2019 11/09/2019 08/13/2019  Weight (lbs) 136 lb 4.8 oz 143 lb 148 lb  Weight (kg) 61.825 kg 64.864 kg 67.132 kg      Telemetry    Sinus rhythm - Personally Reviewed  ECG    Normal sinus rhythm with first-degree AV block and left bundle branch block - Personally Reviewed  Physical Exam  Alert, oriented, elderly male in no distress GEN: No  acute distress.   Neck:  Moderate JVD Cardiac: RRR, 3/6 harsh late peaking systolic murmur at the right upper sternal border, no diastolic murmur Respiratory: Clear to auscultation bilaterally. GI: Soft, nontender, non-distended  MS: No edema; No deformity. Neuro:  Nonfocal  Psych: Normal affect   Labs    High Sensitivity Troponin:   Recent Labs  Lab 11/09/19 1006 11/09/19 1224 11/09/19 1531 11/09/19 1913  TROPONINIHS 439* 489* 613* 724*      Chemistry Recent Labs  Lab 11/09/19 1006 11/10/19 0507  NA 135 134*  K 4.1 3.8  CL 103 104  CO2 18* 18*  GLUCOSE 132* 117*  BUN 51* 55*  CREATININE 3.35* 3.39*  CALCIUM 9.3 9.4  PROT 7.0  --   ALBUMIN 3.6  --   AST 21  --   ALT 16  --   ALKPHOS 79  --   BILITOT 0.9  --   GFRNONAA 15* 15*  GFRAA 17* 17*  ANIONGAP 14 12     Hematology Recent Labs  Lab 11/09/19 1006 11/10/19 0507  WBC 9.9 7.6  RBC 2.75* 2.74*  HGB 8.4* 8.3*  HCT 26.0* 25.3*  MCV 94.5 92.3  MCH 30.5 30.3  MCHC 32.3 32.8  RDW 14.5  14.5  PLT 247 237    BNP Recent Labs  Lab 11/09/19 1006  BNP 979.3*     DDimer  Recent Labs  Lab 11/09/19 1006  DDIMER 1.32*     Radiology    DG Chest Portable 1 View  Result Date: 11/09/2019 CLINICAL DATA:  Dizziness and cough.  Chest pain. EXAM: PORTABLE CHEST 1 VIEW COMPARISON:  Two-view chest x-ray 02/3118 FINDINGS: Heart is enlarged. Diffuse interstitial pattern is seen. Bilateral pleural effusions are present, right greater than left. Bibasilar airspace disease likely reflects atelectasis. Median sternotomy noted. IMPRESSION: 1. Cardiomegaly with interstitial edema and bilateral pleural effusions compatible with congestive heart failure. 2. Bibasilar airspace disease likely reflects atelectasis. Electronically Signed   By: San Morelle M.D.   On: 11/09/2019 10:33    Cardiac Studies   2D echocardiogram pending  2D echocardiogram from 06/18/2019: IMPRESSIONS    1. Mid and basal  inferior/inferior lateral wall hypokinesis . Left  ventricular ejection fraction, by estimation, is 50 to 55%. The left  ventricle has low normal function. The left ventricle demonstrates  regional wall motion abnormalities (see scoring  diagram/findings for description). The left ventricular internal cavity  size was mildly dilated. There is moderate left ventricular hypertrophy.  Left ventricular diastolic parameters are consistent with Grade II  diastolic dysfunction  (pseudonormalization). Elevated left ventricular end-diastolic pressure.  2. Right ventricular systolic function is normal. The right ventricular  size is normal. There is moderately elevated pulmonary artery systolic  pressure.  3. Left atrial size was moderately dilated.  4. The mitral valve is normal in structure. Mild mitral valve  regurgitation. No evidence of mitral stenosis.  5. No significant change in gradients since January 2020 . The aortic  valve is tricuspid. Aortic valve regurgitation is mild. Moderate aortic  valve stenosis.  6. The inferior vena cava is normal in size with greater than 50%  respiratory variability, suggesting right atrial pressure of 3 mmHg.   Patient Profile     86 y.o. male with progressive aortic stenosis now admitted with acute on chronic diastolic heart failure  Assessment & Plan    1.  Progressive, symptomatic aortic stenosis, likely now stage D1 severe disease 2.  Acute on chronic diastolic heart failure, good initial response to diuretic therapy without significant bump in creatinine 3.  Ischemic heart disease with evidence of demand ischemia this admission 4.  Chronic stage IV kidney disease  The patient's clinical presentation is consistent with worsening heart failure and aortic stenosis on a background of chronic ischemic heart disease.  While he is functionally independent, he has stage IV chronic kidney disease at high risk for progression to end-stage renal disease.   He previously underwent AV fistula placement complicated by postoperative myocardial infarction.  His AV fistula failed and he currently has no dialysis access.  There is no question that further evaluation and treatment for TAVR would come with a high risk of acute kidney injury and progression to end-stage renal disease.  I think this patient with not tolerate dialysis well at age 53 and his risk of severe morbidity and even mortality with progression to end-stage renal disease would outweigh the benefit of treating his aortic stenosis.  I had a lengthy discussion with the patient and his daughter about these difficult treatment decisions.  I also spoke with his nephrologist, Dr. Marval Regal and we are in agreement that a palliative approach to his care may be most appropriate.  The patient understands and expresses agreement and meeting  with the palliative medicine team.  I am hopeful that he can return to functional independence as he has previously lived alone and is cognitively quite sharp.  Will place Palliative Medicine consultation.   For questions or updates, please contact [place] Please consult [email or web address] for contact info under     Signed, Sherren Mocha, MD  11/10/2019, 12:20 PM

## 2019-11-10 NOTE — Progress Notes (Signed)
  Echocardiogram 2D Echocardiogram has been performed.  Henry [name] 11/10/2019, 2:16 PM

## 2019-11-10 NOTE — Progress Notes (Signed)
Progress Note  Patient Name: Henry [name] Date of Encounter: 11/10/2019  [facility] Cardiologist: Sherren Mocha, MD   Subjective   Breathing is better, but still gets dizzy easily, even just standing. BP 140s/50s in bed. Good UO (incomplete record of in/out in ED), creatinine unchanged 3.3.  Inpatient Medications    Scheduled Meds: . amLODipine  10 mg Oral QPM  . aspirin EC  81 mg Oral Daily  . atorvastatin  40 mg Oral QPM  . calcitRIOL  0.25 mcg Oral Weekly  . calcium acetate  667 mg Oral BID WC  . furosemide  40 mg Intravenous Once  . influenza vaccine adjuvanted  0.5 mL Intramuscular Tomorrow-1000  . levothyroxine  50 mcg Oral QAC breakfast  . multivitamin  1 tablet Oral QHS  . potassium chloride  20 mEq Oral Once  . sodium chloride flush  3 mL Intravenous Q12H   Continuous Infusions: . sodium chloride     PRN Meds: sodium chloride, acetaminophen, nitroGLYCERIN, ondansetron (ZOFRAN) IV, sodium chloride flush   Vital Signs    Vitals:   11/09/19 1741 11/09/19 2052 11/09/19 2329 11/10/19 0435  BP: (!) 168/71 (!) 137/57 125/63 137/66  Pulse: 86 80 89 88  Resp: 17 18 (!) 23 16  Temp: 98 F (36.7 C) 97.7 F (36.5 C) 99.1 F (37.3 C) 98.8 F (37.1 C)  TempSrc: Oral Oral Oral Oral  SpO2: 99% 99% 94% 94%  Weight:    61.8 kg  Height: 5\' 9"  (1.753 m)       Intake/Output Summary (Last 24 hours) at 11/10/2019 1035 Last data filed at 11/10/2019 0502 Gross per 24 hour  Intake 240 ml  Output 950 ml  Net -710 ml   Last 3 Weights 11/10/2019 11/09/2019 08/13/2019  Weight (lbs) 136 lb 4.8 oz 143 lb 148 lb  Weight (kg) 61.825 kg 64.864 kg 67.132 kg      Telemetry    NSR, PVCs - Personally Reviewed  ECG    NSR, LBBB - Personally Reviewed  Physical Exam  Elderly, frail, but appears comfortable GEN: No acute distress.   Neck: No JVD Cardiac: RRR, barely audible S2, late peaking aortic ejection murmur 2/6, no diastolic murmurs,  rubs, or gallops.    Respiratory: Clear to auscultation bilaterally. GI: Soft, nontender, non-distended  MS: No edema; No deformity. Neuro:  Nonfocal  Psych: Normal affect   Labs    High Sensitivity Troponin:   Recent Labs  Lab 11/09/19 1006 11/09/19 1224 11/09/19 1531 11/09/19 1913  TROPONINIHS 439* 489* 613* 724*      Chemistry Recent Labs  Lab 11/09/19 1006 11/10/19 0507  NA 135 134*  K 4.1 3.8  CL 103 104  CO2 18* 18*  GLUCOSE 132* 117*  BUN 51* 55*  CREATININE 3.35* 3.39*  CALCIUM 9.3 9.4  PROT 7.0  --   ALBUMIN 3.6  --   AST 21  --   ALT 16  --   ALKPHOS 79  --   BILITOT 0.9  --   GFRNONAA 15* 15*  GFRAA 17* 17*  ANIONGAP 14 12     Hematology Recent Labs  Lab 11/09/19 1006 11/10/19 0507  WBC 9.9 7.6  RBC 2.75* 2.74*  HGB 8.4* 8.3*  HCT 26.0* 25.3*  MCV 94.5 92.3  MCH 30.5 30.3  MCHC 32.3 32.8  RDW 14.5 14.5  PLT 247 237    BNP Recent Labs  Lab 11/09/19 1006  BNP 979.3*     DDimer  Recent Labs  Lab 11/09/19 1006  DDIMER 1.32*     Radiology    DG Chest Portable 1 View  Result Date: 11/09/2019 CLINICAL DATA:  Dizziness and cough.  Chest pain. EXAM: PORTABLE CHEST 1 VIEW COMPARISON:  Two-view chest x-ray 02/3118 FINDINGS: Heart is enlarged. Diffuse interstitial pattern is seen. Bilateral pleural effusions are present, right greater than left. Bibasilar airspace disease likely reflects atelectasis. Median sternotomy noted. IMPRESSION: 1. Cardiomegaly with interstitial edema and bilateral pleural effusions compatible with congestive heart failure. 2. Bibasilar airspace disease likely reflects atelectasis. Electronically Signed   By: San Morelle M.D.   On: 11/09/2019 10:33    Cardiac Studies   TTE (06/18/2019)  1. Mid and basal inferior/inferior lateral wall hypokinesis . Left  ventricular ejection fraction, by estimation, is 50 to 55%. The left  ventricle has low normal function. The left ventricle demonstrates  regional wall motion  abnormalities (see scoring  diagram/findings for description). The left ventricular internal cavity  size was mildly dilated. There is moderate left ventricular hypertrophy.  Left ventricular diastolic parameters are consistent with Grade II  diastolic dysfunction  (pseudonormalization). Elevated left ventricular end-diastolic pressure.  2. Right ventricular systolic function is normal. The right ventricular  size is normal. There is moderately elevated pulmonary artery systolic  pressure.  3. Left atrial size was moderately dilated.  4. The mitral valve is normal in structure. Mild mitral valve  regurgitation. No evidence of mitral stenosis.  5. No significant change in gradients since January 2020 . The aortic  valve is tricuspid. Aortic valve regurgitation is mild. Moderate aortic  valve stenosis.  6. The inferior vena cava is normal in size with greater than 50%  respiratory variability, suggesting right atrial pressure of 3 mmHg.   Left Heart Cath (03/27/2018)   Ost RCA to Prox RCA lesion is 100% stenosed.  Origin to Prox Graft lesion is 100% stenosed.  Origin lesion is 100% stenosed.  Prox LAD lesion is 100% stenosed.  Ost 1st Mrg lesion is 90% stenosed.  1. Severe native 3 vessel disease with total occlusion of the RCA and LAD, severe stenosis of the left circumflex 2. S/P remote CABG with continued patency of the LIMA-LAD and SVG-diagonal, total occlusion of the SVG-RCA and SVG-PDA  Patient Profile     86 y.o. male with CAD s/p CABG (2000), HFpEF with EF of 50-55%, moderate AS, 1st degree AV block, HTN, HLD, PAD, CKD4, T2DM who presents to the ED with c/o dizziness with SOB  Assessment & Plan    1. Severe AS: by exam and some of the echo parameters from April study. Current echo pending. Not a SAVR candidate and at best a marginal TAVR candidate. Long discussion of pros and cons in multidisciplinary Structural Heart meeting today. High risk for poor outcome due to  high risk of progression to kidney failure. Dr. Burt Knack will discuss options with patient and daughter later today. 2. CHF: improved. One more dose of diuretic today. Dose daily. 3. HTN: potent and rapidly acting vasodilators such as hydralazine may worsen dizziness if stroke volume is limited and RAAS inhibitors cannot be used. Tends to have a very low DBP and will need to tolerate some elevation in SBP. 4. CKD4: failed AV fistula. 5. CAD s/p CABG: no angina. Occluded native RCA and SVGs to inferior wall, patent LIMA-LAD and SVG-Diag at fairly recent cath.     For questions or updates, please contact [place] Please consult [email or web address] for contact info under  Signed, Sanda Klein, MD  11/10/2019, 10:35 AM

## 2019-11-11 ENCOUNTER — Encounter (HOSPITAL_COMMUNITY): Payer: Self-pay | Admitting: Internal Medicine

## 2019-11-11 ENCOUNTER — Inpatient Hospital Stay (HOSPITAL_COMMUNITY): Payer: Medicare Other

## 2019-11-11 DIAGNOSIS — Z515 Encounter for palliative care: Secondary | ICD-10-CM

## 2019-11-11 DIAGNOSIS — I2510 Atherosclerotic heart disease of native coronary artery without angina pectoris: Secondary | ICD-10-CM

## 2019-11-11 DIAGNOSIS — N179 Acute kidney failure, unspecified: Secondary | ICD-10-CM

## 2019-11-11 DIAGNOSIS — Z7189 Other specified counseling: Secondary | ICD-10-CM

## 2019-11-11 DIAGNOSIS — I5043 Acute on chronic combined systolic (congestive) and diastolic (congestive) heart failure: Secondary | ICD-10-CM

## 2019-11-11 LAB — BASIC METABOLIC PANEL
Anion gap: 12 (ref 5–15)
BUN: 60 mg/dL — ABNORMAL HIGH (ref 8–23)
CO2: 18 mmol/L — ABNORMAL LOW (ref 22–32)
Calcium: 9.3 mg/dL (ref 8.9–10.3)
Chloride: 104 mmol/L (ref 98–111)
Creatinine, Ser: 3.47 mg/dL — ABNORMAL HIGH (ref 0.61–1.24)
GFR calc Af Amer: 17 mL/min — ABNORMAL LOW (ref >60)
GFR calc non Af Amer: 14 mL/min — ABNORMAL LOW (ref >60)
Glucose, Bld: 108 mg/dL — ABNORMAL HIGH (ref 70–99)
Potassium: 3.7 mmol/L (ref 3.5–5.1)
Sodium: 134 mmol/L — ABNORMAL LOW (ref 135–145)

## 2019-11-11 MED ORDER — BENZONATATE 100 MG PO CAPS: 100.0000 mg | ORAL_CAPSULE | Freq: Three times a day (TID) | ORAL | Status: DC | PRN

## 2019-11-11 MED ORDER — FUROSEMIDE 40 MG PO TABS
40.0000 mg | ORAL_TABLET | Freq: Every day | ORAL | Status: DC
  Administered 2019-11-11 – 2019-11-12 (×2): 40 mg via ORAL
  Filled 2019-11-11 (×2): qty 1

## 2019-11-11 NOTE — Progress Notes (Addendum)
Progress Note  Patient Name: Henry [name] Date of Encounter: 11/11/2019  [facility] Cardiologist: Sherren Mocha, MD   Subjective   Had the best sleep last night in awhile. Still coughing up thick brown sputum. Occasional chest tightness. Breathing slightly improved, but still very weak.  Inpatient Medications    Scheduled Meds: . amLODipine  10 mg Oral QPM  . aspirin EC  81 mg Oral Daily  . atorvastatin  40 mg Oral QPM  . calcitRIOL  0.25 mcg Oral Weekly  . calcium acetate  667 mg Oral BID WC  . influenza vaccine adjuvanted  0.5 mL Intramuscular Tomorrow-1000  . levothyroxine  50 mcg Oral QAC breakfast  . multivitamin  1 tablet Oral QHS  . sodium chloride flush  3 mL Intravenous Q12H   Continuous Infusions: . sodium chloride     PRN Meds: sodium chloride, acetaminophen, nitroGLYCERIN, ondansetron (ZOFRAN) IV, sodium chloride flush   Vital Signs    Vitals:   11/10/19 1647 11/10/19 2018 11/11/19 0052 11/11/19 0536  BP: (!) 149/59 (!) 154/64 139/64 132/76  Pulse: 88  87 86  Resp: (!) 25 (!) 23 19 20   Temp: 98.2 F (36.8 C) 98.6 F (37 C) 98.9 F (37.2 C) 99.4 F (37.4 C)  TempSrc: Oral Oral Oral Oral  SpO2: 97% 95% 96% 93%  Weight:    61.3 kg  Height:        Intake/Output Summary (Last 24 hours) at 11/11/2019 0756 Last data filed at 11/11/2019 0500 Gross per 24 hour  Intake 240 ml  Output 1725 ml  Net -1485 ml   Last 3 Weights 11/11/2019 11/10/2019 11/09/2019  Weight (lbs) 135 lb 1.6 oz 136 lb 4.8 oz 143 lb  Weight (kg) 61.281 kg 61.825 kg 64.864 kg      Telemetry    NSR with rare PVCs, HR 80-90s - Personally Reviewed  ECG    9/13 NSR with ST downsloping in the lateral leads - Personally Reviewed  Physical Exam   GEN: No acute distress.   Neck: No JVD Cardiac: RRR, no rubs, or gallops.  3/6 systolic murmur at RUSB Respiratory: Clear to auscultation bilaterally. GI: Soft, nontender, non-distended  MS: No edema; No deformity. Neuro:   Nonfocal  Psych: Normal affect   Labs    High Sensitivity Troponin:   Recent Labs  Lab 11/09/19 1006 11/09/19 1224 11/09/19 1531 11/09/19 1913  TROPONINIHS 439* 489* 613* 724*      Chemistry Recent Labs  Lab 11/09/19 1006 11/10/19 0507 11/11/19 0425  NA 135 134* 134*  K 4.1 3.8 3.7  CL 103 104 104  CO2 18* 18* 18*  GLUCOSE 132* 117* 108*  BUN 51* 55* 60*  CREATININE 3.35* 3.39* 3.47*  CALCIUM 9.3 9.4 9.3  PROT 7.0  --   --   ALBUMIN 3.6  --   --   AST 21  --   --   ALT 16  --   --   ALKPHOS 79  --   --   BILITOT 0.9  --   --   GFRNONAA 15* 15* 14*  GFRAA 17* 17* 17*  ANIONGAP 14 12 12      Hematology Recent Labs  Lab 11/09/19 1006 11/10/19 0507  WBC 9.9 7.6  RBC 2.75* 2.74*  HGB 8.4* 8.3*  HCT 26.0* 25.3*  MCV 94.5 92.3  MCH 30.5 30.3  MCHC 32.3 32.8  RDW 14.5 14.5  PLT 247 237    BNP Recent Labs  Lab  11/09/19 1006  BNP 979.3*     DDimer  Recent Labs  Lab 11/09/19 1006  DDIMER 1.32*     Radiology    DG Chest Portable 1 View  Result Date: 11/09/2019 CLINICAL DATA:  Dizziness and cough.  Chest pain. EXAM: PORTABLE CHEST 1 VIEW COMPARISON:  Two-view chest x-ray 02/3118 FINDINGS: Heart is enlarged. Diffuse interstitial pattern is seen. Bilateral pleural effusions are present, right greater than left. Bibasilar airspace disease likely reflects atelectasis. Median sternotomy noted. IMPRESSION: 1. Cardiomegaly with interstitial edema and bilateral pleural effusions compatible with congestive heart failure. 2. Bibasilar airspace disease likely reflects atelectasis. Electronically Signed   By: San Morelle M.D.   On: 11/09/2019 10:33   ECHOCARDIOGRAM COMPLETE  Result Date: 11/10/2019    ECHOCARDIOGRAM REPORT   Patient Name:   AAIDYN [facility] Date of Exam: 11/10/2019 Medical Rec #:  [id number]        Height:       69.0 in Accession #:    [id number]       Weight:       136.3 lb Date of Birth:  1924/08/18         BSA:          1.755 m Patient Age:     85 years         BP:           146/62 mmHg Patient Gender: M                HR:           86 bpm. Exam Location:  Inpatient Procedure: 2D Echo, Cardiac Doppler and Color Doppler Indications:    Elevated Troponin.  History:        Patient has prior history of Echocardiogram examinations, most                 recent 06/18/2019. Previous Myocardial Infarction and CAD,                 Carotid Disease; Risk Factors:Hypertension, Diabetes and                 Dyslipidemia.  Sonographer:    Tiffany Dance Referring Phys: [id number] RONDELL A SMITH IMPRESSIONS  1. Left ventricular ejection fraction, by estimation, is 35 to 40%. The left ventricle has moderately decreased function. The left ventricle demonstrates global hypokinesis. Left ventricular diastolic function could not be evaluated.  2. Right ventricular systolic function is mildly reduced. The right ventricular size is normal. There is normal pulmonary artery systolic pressure.  3. Left atrial size was mildly dilated.  4. The mitral valve is normal in structure. Mild mitral valve regurgitation. No evidence of mitral stenosis.  5. The aortic valve is tricuspid. There is severe calcifcation of the aortic valve. There is moderate thickening of the aortic valve. Aortic valve regurgitation is moderate. Moderate aortic valve stenosis. Aortic valve area, by VTI measures 0.92 cm. Aortic valve mean gradient measures 18.0 mmHg. Aortic valve Vmax measures 2.87 m/s. The dimensionless index is low at 0.26 and consistent with more severe AS. SV index is reduced at 31. The AV mean gradient is likely underestimated in the setting of LV dysfunction and this likely represents at least moderate to severe low flow low gradient aortic stenosis.  6. Aortic dilatation noted. There is borderline dilatation of the aortic root, measuring 37 mm. There is dilatation of the ascending aorta, measuring 38 mm.  7. The inferior vena cava is normal in  size with greater than 50% respiratory  variability, suggesting right atrial pressure of 3 mmHg. FINDINGS  Left Ventricle: Left ventricular ejection fraction, by estimation, is 35 to 40%. The left ventricle has moderately decreased function. The left ventricle demonstrates global hypokinesis. The left ventricular internal cavity size was normal in size. There is no left ventricular hypertrophy. Left ventricular diastolic function could not be evaluated. Right Ventricle: The right ventricular size is normal. No increase in right ventricular wall thickness. Right ventricular systolic function is mildly reduced. There is normal pulmonary artery systolic pressure. The tricuspid regurgitant velocity is 2.52 m/s, and with an assumed right atrial pressure of 3 mmHg, the estimated right ventricular systolic pressure is 09.3 mmHg. Left Atrium: Left atrial size was mildly dilated. Right Atrium: Right atrial size was normal in size. Pericardium: There is no evidence of pericardial effusion. Mitral Valve: The mitral valve is normal in structure. There is mild thickening of the anterior mitral valve leaflet(s). There is mild calcification of the anterior mitral valve leaflet(s). Mild to moderate mitral annular calcification. Mild mitral valve  regurgitation. No evidence of mitral valve stenosis. Tricuspid Valve: The tricuspid valve is normal in structure. Tricuspid valve regurgitation is trivial. No evidence of tricuspid stenosis. Aortic Valve: The aortic valve is tricuspid. There is severe calcifcation of the aortic valve. There is moderate thickening of the aortic valve. Aortic valve regurgitation is moderate. Aortic regurgitation PHT measures 225 msec. Moderate to severe aortic  stenosis is present. Aortic valve mean gradient measures 18.0 mmHg. Aortic valve peak gradient measures 32.9 mmHg. Aortic valve area, by VTI measures 0.92 cm. Pulmonic Valve: The pulmonic valve was normal in structure. Pulmonic valve regurgitation is not visualized. No evidence of pulmonic  stenosis. Aorta: Aortic dilatation noted. There is borderline dilatation of the aortic root, measuring 37 mm. There is dilatation of the ascending aorta, measuring 38 mm. Venous: The inferior vena cava is normal in size with greater than 50% respiratory variability, suggesting right atrial pressure of 3 mmHg. IAS/Shunts: No atrial level shunt detected by color flow Doppler.  LEFT VENTRICLE PLAX 2D LVIDd:         5.10 cm LVIDs:         4.40 cm LV PW:         1.20 cm LV IVS:        0.90 cm LVOT diam:     2.10 cm LV SV:         55 LV SV Index:   31 LVOT Area:     3.46 cm  RIGHT VENTRICLE RV Basal diam:  2.70 cm RV Mid diam:    1.90 cm RV S prime:     8.78 cm/s TAPSE (M-mode): 1.4 cm LEFT ATRIUM             Index       RIGHT ATRIUM           Index LA diam:        4.80 cm 2.73 cm/m  RA Area:     14.70 cm LA Vol (A2C):   73.7 ml 41.99 ml/m RA Volume:   31.50 ml  17.95 ml/m LA Vol (A4C):   71.5 ml 40.73 ml/m LA Biplane Vol: 73.8 ml 42.04 ml/m  AORTIC VALVE AV Area (Vmax):    0.91 cm AV Area (Vmean):   0.95 cm AV Area (VTI):     0.92 cm AV Vmax:           287.00 cm/s AV Vmean:  196.333 cm/s AV VTI:            0.602 m AV Peak Grad:      32.9 mmHg AV Mean Grad:      18.0 mmHg LVOT Vmax:         75.20 cm/s LVOT Vmean:        53.700 cm/s LVOT VTI:          0.159 m LVOT/AV VTI ratio: 0.26 AI PHT:            225 msec  AORTA Ao Root diam: 3.70 cm Ao Asc diam:  3.80 cm MITRAL VALVE                TRICUSPID VALVE MV Area (PHT): 5.54 cm     TR Peak grad:   25.4 mmHg MV Decel Time: 137 msec     TR Vmax:        252.00 cm/s MV E velocity: 132.00 cm/s MV A velocity: 124.00 cm/s  SHUNTS MV E/A ratio:  1.06         Systemic VTI:  0.16 m                             Systemic Diam: 2.10 cm Fransico Him MD Electronically signed by Fransico Him MD Signature Date/Time: 11/10/2019/3:04:25 PM    Final     Cardiac Studies   Echo 11/10/2019 1. Left ventricular ejection fraction, by estimation, is 35 to 40%. The  left  ventricle has moderately decreased function. The left ventricle  demonstrates global hypokinesis. Left ventricular diastolic function could  not be evaluated.  2. Right ventricular systolic function is mildly reduced. The right  ventricular size is normal. There is normal pulmonary artery systolic  pressure.  3. Left atrial size was mildly dilated.  4. The mitral valve is normal in structure. Mild mitral valve  regurgitation. No evidence of mitral stenosis.  5. The aortic valve is tricuspid. There is severe calcifcation of the  aortic valve. There is moderate thickening of the aortic valve. Aortic  valve regurgitation is moderate. Moderate aortic valve stenosis. Aortic  valve area, by VTI measures 0.92 cm.  Aortic valve mean gradient measures 18.0 mmHg. Aortic valve Vmax measures  2.87 m/s. The dimensionless index is low at 0.26 and consistent with more  severe AS. SV index is reduced at 31. The AV mean gradient is likely  underestimated in the setting of LV  dysfunction and this likely represents at least moderate to severe low  flow low gradient aortic stenosis.  6. Aortic dilatation noted. There is borderline dilatation of the aortic  root, measuring 37 mm. There is dilatation of the ascending aorta,  measuring 38 mm.  7. The inferior vena cava is normal in size with greater than 50%  respiratory variability, suggesting right atrial pressure of 3 mmHg.   Patient Profile     86 y.o. male with CAD s/p CABG 6301, chronic diastolic CHF, previously moderate AS, HTN, HLD, PAD, DM II and CKD stage IV presented with acute on chronic diastolic CHF likely due to progression of his aortic stenosis  Assessment & Plan    1. Acute on chronic combined CHF  - Echo 06/18/2019 showed EF 50-55%, mid and basal inferior/inferolateral wall hypokinesis, grade II DD, mild MR, mild AI with moderate AR  - Echo 11/10/2019 showed EF 35-40%, normal PASP, mild MR, moderate AI and AS, the AV mean gradient  likely  underestimated in the setting of LV dysfunction, therefore patient likely has at least moderate to severe low flow low gradient aortic stenosis.  - I/O -2L. Admission weight 143 lbs, today's weight 135 lbs.   - appears to be near euvolemic, although still coughing. With uptrending of Cr, hesitant to give more IV lasix which may drop the preop in severe aortic stenosis. Fine crackles in the base of the lung, unclear if pulmonary edema vs atelectasis. PASP normal on yesterday's echo. Repeat CXR  - new LV dysfunction likely due to combination of underlying CAD and worsening aortic stenosis. He is not a good candidate for invasive workup. Currently on amlodipine, hesitant to add BB given possible low flow AS. Unable to add ACEI or ARB given renal function. Prognosis poor.   2. Progressive aortic stenosis  - see above, aortic stenosis likely worsened.   - not a candidate for traditional valve repair.   - seen by Dr. Burt Knack, felt risk of TAVR outweighs the potential benefit. After long discussion with family member and the patient, palliative care service was consulted.    3. Productive cough: likely related to recent CHF  4. CAD s/p CABG: occasional chest discomfort likely related to underlying CAD exacerbated by AS. However he is not a candidate for any invasive workup  5. Acute on chronic renal insufficiency: need to watch carefully  6. HTN  7. HLD  8. DM II  9. Deconditioning: have not ambulated during this admission. Pending palliative care discussion.        Hilbert Corrigan, PA  11/11/2019, 7:56 AM    For questions or updates, please contact [place] Please consult [email or web address] for contact info under   I have seen and examined the patient along with Almyra Deforest, PA .  I have reviewed the chart, notes and new data.  I agree with their note.  Key new complaints: breathing better, still has a nagging cough. Has not stood up to know if he is dizzy. Wife is going to [employer] [facility] soon Key examination changes: clear lungs, no JVD Key new findings / data: creatinine inched up to 3.47. K 3.7  PLAN: BP in desirable range off hydralazine. May need to decrease amlodipine also in the future. Switch to oral diuretics. Had a long discussion regarding goals of care. Can simplify meds list (for example, not sure aggressive antiplatelet therapy is a priority anymore). Would shift dietary concerns from a low fat low cholesterol diet to one focused solely on avoiding high sodium content foods. Also avoid excessive K-rich foods, but this has not yet been an issue in practice. Palliative care meeting today. He would prefer to go home with home hospice, with the option to join his wife at [facility] in the future.  Sanda Klein, MD, [place] [phone] 11/11/2019, 10:46 AM

## 2019-11-11 NOTE — Plan of Care (Signed)
?  Problem: Education: ?Goal: Knowledge of General Education information will improve ?Description: Including pain rating scale, medication(s)/side effects and non-pharmacologic comfort measures ?Outcome: Progressing ?  ?Problem: Activity: ?Goal: Capacity to carry out activities will improve ?Outcome: Progressing ?  ?

## 2019-11-11 NOTE — Progress Notes (Signed)
[facility]  PROGRESS NOTE  Henry [name]:[id number] DOB: [birth year] DOA: 11/09/2019 PCP: [name], MD   Brief HPI:   86 year old male with history of hypertension, hyperlipidemia, CAD s/p CABG, moderate aortic stenosis, diabetes mellitus type 2, CKD stage IV presented with complaints of dizziness and cough.  Upon admission BNP was elevated 979.3.  Troponin was elevated to [phone].  Chest x-ray showed cardiomegaly with interstitial edema and bilateral pleural effusion compatible with congestive heart failure.  Cardiology was formally consulted.  Patient was found to have severe aortic stenosis but not a candidate for TAVR due to potential risk of developing into ESRD.  Palliative care has been consulted.    Subjective   Patient seen and examined, complains of coughing up brown phlegm.  Requiring oxygen 0.5 L/min.   Assessment/Plan:     1. Acute on chronic diastolic congestive heart failure-patient presented with complaints of cough and shortness of breath.  Chest x-ray showed cardiomegaly with interstitial edema and bilateral pleural effusions compatible with CHF exacerbation.  BNP was elevated at 917.3.  Echocardiogram showed EF 55% with grade 2 diastolic dysfunction on 9/62/8366.  Cardiology was consulted patient started on IV Lasix. 2. Aortic stenosis-progression to D1 severe disease noted per cardiology.  Patient is not a candidate for TAVR due to high risk of progression to ESRD with no hemodialysis access available and patient will not be a candidate for dialysis.  Palliative care consulted after cardiology discussed with nephrology. 3. Elevated troponin-patient has history of CAD s/p CABG.  Troponin elevated [phone].  Last cardiac cath in January 2020 showed severe three-vessel disease which was not amicable to PCI.  Continue aspirin, Plavix, statin. 4. Acute kidney injury superimposed on CKD stage IV-patient baseline creatinine has been around 3, on 08/13/2019.   Patient presented with creatinine of 3.35 with BUN 51.  Suspected hypoperfusion due to CHF.  Patient followed by nephrology as outpatient.  Creatinine stable at 3.47. 5. Hypertension-patient blood pressure is stable.  Started on home medications including amlodipine 10 mg daily, furosemide 40 mg daily, hydralazine 25 mg p.o. twice daily.  Hydralazine has been on hold due to aortic stenosis.  Continue amlodipine. 6. Hypothyroidism-continue Synthroid 7. Hyperlipidemia-continue statin.     COVID-19 Labs  Recent Labs    11/09/19 1006  DDIMER 1.32*    Lab Results  Component Value Date   SARSCOV2NAA NEGATIVE 11/09/2019   [place] NEGATIVE 07/14/2018     Scheduled medications:   . amLODipine  10 mg Oral QPM  . aspirin EC  81 mg Oral Daily  . atorvastatin  40 mg Oral QPM  . calcitRIOL  0.25 mcg Oral Weekly  . calcium acetate  667 mg Oral BID WC  . furosemide  40 mg Oral Daily  . influenza vaccine adjuvanted  0.5 mL Intramuscular Tomorrow-1000  . levothyroxine  50 mcg Oral QAC breakfast  . multivitamin  1 tablet Oral QHS  . sodium chloride flush  3 mL Intravenous Q12H     CBG: No results for input(s): GLUCAP in the last 168 hours.  SpO2: 98 % O2 Flow Rate (L/min): 0.5 L/min    CBC: Recent Labs  Lab 11/09/19 1006 11/10/19 0507  WBC 9.9 7.6  NEUTROABS 7.6  --   HGB 8.4* 8.3*  HCT 26.0* 25.3*  MCV 94.5 92.3  PLT 247 035    Basic Metabolic Panel: Recent Labs  Lab 11/09/19 1006 11/10/19 0507 11/11/19 0425  NA 135 134* 134*  K 4.1 3.8 3.7  CL 103 104 104  CO2 18* 18* 18*  GLUCOSE 132* 117* 108*  BUN 51* 55* 60*  CREATININE 3.35* 3.39* 3.47*  CALCIUM 9.3 9.4 9.3  MG 2.1  --   --      Liver Function Tests: Recent Labs  Lab 11/09/19 1006  AST 21  ALT 16  ALKPHOS 79  BILITOT 0.9  PROT 7.0  ALBUMIN 3.6     Antibiotics: Anti-infectives (From admission, onward)   None       DVT prophylaxis: SCDs  Code Status: DNR  Family Communication:  Discussed with patient's daughter at bedside    Status is: Inpatient  Dispo: The patient is from: Home              Anticipated d/c is to: Home              Anticipated d/c date is: 06/12/2019              Patient currently not medically stable for discharge  Barrier to discharge-ongoing treatment for acute on chronic diastolic heart failure     Consultants:  Cardiology  Procedures:  2D echo   Objective   Vitals:   11/11/19 0052 11/11/19 0536 11/11/19 0923 11/11/19 1445  BP: 139/64 132/76 (!) 136/54 140/70  Pulse: 87 86 85 90  Resp: 19 20 15 20   Temp: 98.9 F (37.2 C) 99.4 F (37.4 C) 98.2 F (36.8 C) 98.5 F (36.9 C)  TempSrc: Oral Oral Oral Oral  SpO2: 96% 93% 96% 98%  Weight:  61.3 kg    Height:        Intake/Output Summary (Last 24 hours) at 11/11/2019 1632 Last data filed at 11/11/2019 7622 Gross per 24 hour  Intake 462 ml  Output 1725 ml  Net -1263 ml    09/13 1901 - 09/15 0700 In: 480 [P.O.:480] Out: 2675 [Urine:2675]  Filed Weights   11/09/19 1010 11/10/19 0435 11/11/19 0536  Weight: 64.9 kg 61.8 kg 61.3 kg    Physical Examination:    General: Appears in no acute distress  Cardiovascular: S1-S2, regular, no murmur auscultated  Respiratory: Bibasilar crackles auscultated  Abdomen: Abdomen is soft, nontender, no organomegaly  Extremities: No edema in the lower extremities  Neurologic: Alert, oriented x3, intact insight and judgment    Data Reviewed:   Recent Results (from the past 240 hour(s))  SARS Coronavirus 2 by RT PCR (hospital order, performed in [place] hospital lab) Nasopharyngeal Nasopharyngeal Swab     Status: None   Collection Time: 11/09/19 10:03 AM   Specimen: Nasopharyngeal Swab  Result Value Ref Range Status   SARS Coronavirus 2 NEGATIVE NEGATIVE Final    Comment: (NOTE) SARS-CoV-2 target nucleic acids are NOT DETECTED.  The SARS-CoV-2 RNA is generally detectable in upper and lower respiratory specimens  during the acute phase of infection. The lowest concentration of SARS-CoV-2 viral copies this assay can detect is 250 copies / mL. A negative result does not preclude SARS-CoV-2 infection and should not be used as the sole basis for treatment or other patient management decisions.  A negative result may occur with improper specimen collection / handling, submission of specimen other than nasopharyngeal swab, presence of viral mutation(s) within the areas targeted by this assay, and inadequate number of viral copies (<250 copies / mL). A negative result must be combined with clinical observations, patient history, and epidemiological information.  Fact Sheet for Patients:   StrictlyIdeas.no  Fact Sheet for Healthcare Providers: BankingDealers.co.za  This test  is not yet approved or  cleared by the [place] and has been authorized for detection and/or diagnosis of SARS-CoV-2 by FDA under an Emergency Use Authorization (EUA).  This EUA will remain in effect (meaning this test can be used) for the duration of the COVID-19 declaration under Section 564(b)(1) of the Act, 21 U.S.C. section 360bbb-3(b)(1), unless the authorization is terminated or revoked sooner.  Performed at [facility], [place] [address] [place], [place] [address]     Recent Labs  Lab 11/09/19 1006  LIPASE 24   No results for input(s): AMMONIA in the last 168 hours.  Cardiac Enzymes: No results for input(s): CKTOTAL, CKMB, CKMBINDEX, TROPONINI in the last 168 hours. BNP (last 3 results) Recent Labs    11/09/19 1006  BNP 979.3*     Studies:  DG CHEST PORT 1 VIEW  Result Date: 11/11/2019 CLINICAL DATA:  Cough EXAM: PORTABLE CHEST 1 VIEW COMPARISON:  11/09/2019 FINDINGS: Bilateral airspace opacities again noted in the mid and lower lung zones. No significant change since prior study. Heart is borderline in size. Prior CABG. No effusions or  pneumothorax. IMPRESSION: Bilateral airspace opacities, not significantly changed since prior study. Electronically Signed   By: Rolm Baptise M.D.   On: 11/11/2019 09:27   ECHOCARDIOGRAM COMPLETE  Result Date: 11/10/2019    ECHOCARDIOGRAM REPORT   Patient Name:   PALMER FAHRNER [facility] Date of Exam: 11/10/2019 Medical Rec #:  [id number]        Height:       69.0 in Accession #:    [id number]       Weight:       136.3 lb Date of Birth:  05/01/1924         BSA:          1.755 m Patient Age:    34 years         BP:           146/62 mmHg Patient Gender: M                HR:           86 bpm. Exam Location:  Inpatient Procedure: 2D Echo, Cardiac Doppler and Color Doppler Indications:    Elevated Troponin.  History:        Patient has prior history of Echocardiogram examinations, most                 recent 06/18/2019. Previous Myocardial Infarction and CAD,                 Carotid Disease; Risk Factors:Hypertension, Diabetes and                 Dyslipidemia.  Sonographer:    Tiffany Dance Referring Phys: [id number] RONDELL A SMITH IMPRESSIONS  1. Left ventricular ejection fraction, by estimation, is 35 to 40%. The left ventricle has moderately decreased function. The left ventricle demonstrates global hypokinesis. Left ventricular diastolic function could not be evaluated.  2. Right ventricular systolic function is mildly reduced. The right ventricular size is normal. There is normal pulmonary artery systolic pressure.  3. Left atrial size was mildly dilated.  4. The mitral valve is normal in structure. Mild mitral valve regurgitation. No evidence of mitral stenosis.  5. The aortic valve is tricuspid. There is severe calcifcation of the aortic valve. There is moderate thickening of the aortic valve. Aortic valve regurgitation is moderate. Moderate aortic valve stenosis. Aortic valve area, by VTI  measures 0.92 cm. Aortic valve mean gradient measures 18.0 mmHg. Aortic valve Vmax measures 2.87 m/s. The dimensionless index is low at  0.26 and consistent with more severe AS. SV index is reduced at 31. The AV mean gradient is likely underestimated in the setting of LV dysfunction and this likely represents at least moderate to severe low flow low gradient aortic stenosis.  6. Aortic dilatation noted. There is borderline dilatation of the aortic root, measuring 37 mm. There is dilatation of the ascending aorta, measuring 38 mm.  7. The inferior vena cava is normal in size with greater than 50% respiratory variability, suggesting right atrial pressure of 3 mmHg. FINDINGS  Left Ventricle: Left ventricular ejection fraction, by estimation, is 35 to 40%. The left ventricle has moderately decreased function. The left ventricle demonstrates global hypokinesis. The left ventricular internal cavity size was normal in size. There is no left ventricular hypertrophy. Left ventricular diastolic function could not be evaluated. Right Ventricle: The right ventricular size is normal. No increase in right ventricular wall thickness. Right ventricular systolic function is mildly reduced. There is normal pulmonary artery systolic pressure. The tricuspid regurgitant velocity is 2.52 m/s, and with an assumed right atrial pressure of 3 mmHg, the estimated right ventricular systolic pressure is 94.7 mmHg. Left Atrium: Left atrial size was mildly dilated. Right Atrium: Right atrial size was normal in size. Pericardium: There is no evidence of pericardial effusion. Mitral Valve: The mitral valve is normal in structure. There is mild thickening of the anterior mitral valve leaflet(s). There is mild calcification of the anterior mitral valve leaflet(s). Mild to moderate mitral annular calcification. Mild mitral valve  regurgitation. No evidence of mitral valve stenosis. Tricuspid Valve: The tricuspid valve is normal in structure. Tricuspid valve regurgitation is trivial. No evidence of tricuspid stenosis. Aortic Valve: The aortic valve is tricuspid. There is severe  calcifcation of the aortic valve. There is moderate thickening of the aortic valve. Aortic valve regurgitation is moderate. Aortic regurgitation PHT measures 225 msec. Moderate to severe aortic  stenosis is present. Aortic valve mean gradient measures 18.0 mmHg. Aortic valve peak gradient measures 32.9 mmHg. Aortic valve area, by VTI measures 0.92 cm. Pulmonic Valve: The pulmonic valve was normal in structure. Pulmonic valve regurgitation is not visualized. No evidence of pulmonic stenosis. Aorta: Aortic dilatation noted. There is borderline dilatation of the aortic root, measuring 37 mm. There is dilatation of the ascending aorta, measuring 38 mm. Venous: The inferior vena cava is normal in size with greater than 50% respiratory variability, suggesting right atrial pressure of 3 mmHg. IAS/Shunts: No atrial level shunt detected by color flow Doppler.  LEFT VENTRICLE PLAX 2D LVIDd:         5.10 cm LVIDs:         4.40 cm LV PW:         1.20 cm LV IVS:        0.90 cm LVOT diam:     2.10 cm LV SV:         55 LV SV Index:   31 LVOT Area:     3.46 cm  RIGHT VENTRICLE RV Basal diam:  2.70 cm RV Mid diam:    1.90 cm RV S prime:     8.78 cm/s TAPSE (M-mode): 1.4 cm LEFT ATRIUM             Index       RIGHT ATRIUM           Index LA diam:  4.80 cm 2.73 cm/m  RA Area:     14.70 cm LA Vol (A2C):   73.7 ml 41.99 ml/m RA Volume:   31.50 ml  17.95 ml/m LA Vol (A4C):   71.5 ml 40.73 ml/m LA Biplane Vol: 73.8 ml 42.04 ml/m  AORTIC VALVE AV Area (Vmax):    0.91 cm AV Area (Vmean):   0.95 cm AV Area (VTI):     0.92 cm AV Vmax:           287.00 cm/s AV Vmean:          196.333 cm/s AV VTI:            0.602 m AV Peak Grad:      32.9 mmHg AV Mean Grad:      18.0 mmHg LVOT Vmax:         75.20 cm/s LVOT Vmean:        53.700 cm/s LVOT VTI:          0.159 m LVOT/AV VTI ratio: 0.26 AI PHT:            225 msec  AORTA Ao Root diam: 3.70 cm Ao Asc diam:  3.80 cm MITRAL VALVE                TRICUSPID VALVE MV Area (PHT): 5.54 cm      TR Peak grad:   25.4 mmHg MV Decel Time: 137 msec     TR Vmax:        252.00 cm/s MV E velocity: 132.00 cm/s MV A velocity: 124.00 cm/s  SHUNTS MV E/A ratio:  1.06         Systemic VTI:  0.16 m                             Systemic Diam: 2.10 cm Fransico Him MD Electronically signed by Fransico Him MD Signature Date/Time: 11/10/2019/3:04:25 PM    Final        Oswald Hillock   [facility] If 7PM-7AM, please contact night-coverage at [email or web address], Office  [phone]   11/11/2019, 4:32 PM  LOS: 2 days

## 2019-11-11 NOTE — Consult Note (Signed)
Consultation Note Date: 11/11/2019   Patient Name: Henry [name]  DOB: Jul 14, 1924  MRN: [id number]  Age / Sex: 86 y.o., male  PCP: [name], MD Referring Physician: Oswald Hillock, MD  Reason for Consultation: Establishing goals of care and Psychosocial/spiritual support  HPI/Patient Profile: 86 y.o. male  with past medical history of HTN/HLD, CAD sp CABG, aortic stenosis, DM2, CKD 4 with failed fistual, anemia, DJD,  admitted on 11/09/2019 with acute on chronic CHF, elevated troponin.   Clinical Assessment and Goals of Care: I have reviewed medical records including EPIC notes, labs and imaging, received report from bedside nursing staff, examined the patient and met at bedside with daughters [name], [name] and [name] to discuss diagnosis prognosis, [place], EOL wishes, disposition and options.   Mr. Darden, [place],  is lying quietly n bed.  He greets me, making and keeping eye contact.  He appears acutely/chronically ill and frail.  I introduced Palliative Medicine as specialized medical care for people living with serious illness. It focuses on providing relief from the symptoms and stress of a serious illness.   We discussed a brief life review of the patient. Chelsey has 4 daughters and a son in [place].  Kennedy's wife started showing symptoms of dementia around 10 years ago.  He was in Designer, television/film set.    As far as functional and nutritional status,  Kayden has been independent with ADLs / IALDs.  His daughters are nearby to help if needed.  At this point, her tells me that he is ready to accept more help.    We discussed current illness and what it means in the larger context of on-going co-morbidities.  Natural disease trajectory and expectations at EOL were discussed.  We talk in detail about CHF treatment and management.  We talk about how the heart works, Dealer, mechanical, valves, CAD.  We talk  about low salt diet  I attempted to elicit values and goals of care important to the patient.  Patient and family talk about future choices for Trayden, encouraging him to be less focused on diet (except for low salt), and take pleasure in life where he can.   The difference between aggressive medical intervention and comfort care was considered in light of the patient's goals of care. We talk about some "what if's and maybes".    Advanced directives, concepts specific to code status, were considered and discussed.  Code status verified with patient and family.   Hospice and Palliative Care services outpatient were explained and offered. We talk at length and in detail about at home palliative services, at home "treat the treatable" hospice care, and residential hospice services.  Patient and family would like to start with out patient palliative, but tell me they anticipate transition of treat the treatable hospice care, when appropriate.   Questions and concerns were addressed.  The family was encouraged to call with questions or concerns.   Conference with attending, bedside nursing staff, [facility] team related to patient condition needs, GOC.  PMT to follow.    HCPOA   HCPOA - Mr. Torti has named his daughter [name] as his healthcare surrogate.   Daughter [name] is his durable POA.     SUMMARY OF RECOMMENDATIONS   Continue to treat the treatable, DNR Requesting out patient palliative services with [facility] (choice offered) Transition to treat the treatable hospice care when appropriate.    Code Status/Advance Care Planning:  DNR - verified with patient and daughters.   Symptom Management:   Per hospitalist, no additional needs at this time.   Palliative Prophylaxis:   Oral Care and Turn Reposition  Additional Recommendations (Limitations, Scope, Preferences):  treat the treatable, but no CPR or intubation, realizes he is not a candidate for HD.    Psycho-social/Spiritual:   Desire for further Chaplaincy support:no  Additional Recommendations: Caregiving  Support/Resources and Education on Hospice  Prognosis:   < 6 months, 6-12 months or less based on chronic illness burden.  Prognosis discussed with patient and family with permission.    Discharge Planning: anticipate home with [place] services.       Primary Diagnoses: Present on Admission: . Anemia of chronic disease . Hypothyroidism . Hypertension . Elevated troponin . DNR (do not resuscitate) . CAD (coronary artery disease)   I have reviewed the medical record, interviewed the patient and family, and examined the patient. The following aspects are pertinent.  Past Medical History:  Diagnosis Date  . Anemia   . CAD of autologous bypass graft   . Carotid artery disease ([place])   . Diabetes mellitus without complication ([place])   . DJD (degenerative joint disease)   . History of anemia of chronic disease   . Hypercholesteremia   . Hypertension   . NSTEMI (non-ST elevated myocardial infarction) ([place])   . PVD (peripheral vascular disease) with claudication ([place])   . Renal insufficiency   . Wears dentures   . Wears glasses    Social History   Socioeconomic History  . Marital status: Married    Spouse name: [name]  . Number of children: 5  . Years of education: Not on file  . Highest education level: Not on file  Occupational History  . Occupation: retired    Fish farm manager: RETIRED  Tobacco Use  . Smoking status: Former Smoker    Packs/day: 1.00    Years: 10.00    Pack years: 10.00    Types: Cigarettes    Quit date: 02/26/1953    Years since quitting: 66.7  . Smokeless tobacco: Never Used  Vaping Use  . Vaping Use: Never used  Substance and Sexual Activity  . Alcohol use: Not Currently    Alcohol/week: 0.0 standard drinks  . Drug use: No  . Sexual activity: Not Currently  Other Topics Concern  . Not on file  Social History Narrative  . Not on file    Social Determinants of Health   Financial Resource Strain:   . Difficulty of Paying Living Expenses: Not on file  Food Insecurity:   . Worried About Charity fundraiser in the Last Year: Not on file  . Ran Out of Food in the Last Year: Not on file  Transportation Needs:   . Lack of Transportation (Medical): Not on file  . Lack of Transportation (Non-Medical): Not on file  Physical Activity:   . Days of Exercise per Week: Not on file  . Minutes of Exercise per Session: Not on file  Stress:   . Feeling of Stress : Not  on file  Social Connections:   . Frequency of Communication with Friends and Family: Not on file  . Frequency of Social Gatherings with Friends and Family: Not on file  . Attends Religious Services: Not on file  . Active Member of Clubs or Organizations: Not on file  . Attends Archivist Meetings: Not on file  . Marital Status: Not on file   Family History  Problem Relation Age of Onset  . Heart disease Mother   . Heart disease Father    Scheduled Meds: . amLODipine  10 mg Oral QPM  . aspirin EC  81 mg Oral Daily  . atorvastatin  40 mg Oral QPM  . calcitRIOL  0.25 mcg Oral Weekly  . calcium acetate  667 mg Oral BID WC  . furosemide  40 mg Oral Daily  . influenza vaccine adjuvanted  0.5 mL Intramuscular Tomorrow-1000  . levothyroxine  50 mcg Oral QAC breakfast  . multivitamin  1 tablet Oral QHS  . sodium chloride flush  3 mL Intravenous Q12H   Continuous Infusions: . sodium chloride     PRN Meds:.sodium chloride, acetaminophen, benzonatate, nitroGLYCERIN, ondansetron (ZOFRAN) IV, sodium chloride flush Medications Prior to Admission:  Prior to Admission medications   Medication Sig Start Date End Date Taking? Authorizing Provider  amLODipine (NORVASC) 10 MG tablet TAKE 1 TABLET BY MOUTH ONCE DAILY IN THE EVENING Patient taking differently: Take 10 mg by mouth every evening.  10/20/19  Yes [name], MD  aspirin EC 81 MG tablet Take 81 mg by  mouth daily.    Yes [provider]  atorvastatin (LIPITOR) 40 MG tablet Take 1 tablet (40 mg total) by mouth every evening. 04/02/18  Yes Mariel Aloe, MD  b complex-vitamin c-folic acid (NEPHRO-VITE) 0.8 MG TABS tablet Take 1 tablet by mouth at bedtime.   Yes [provider]  calcitRIOL (ROCALTROL) 0.25 MCG capsule Take 0.25 mcg by mouth once a week. Monday   Yes [provider]  calcium acetate (PHOSLO) 667 MG capsule Take 667 mg by mouth 2 (two) times daily with a meal.   Yes [provider]  clopidogrel (PLAVIX) 75 MG tablet Take 1 tablet (75 mg total) by mouth daily. 04/03/18  Yes Mariel Aloe, MD  Darbepoetin Alfa (ARANESP) 100 MCG/0.5ML SOSY injection Inject 100 mcg into the skin every 30 (thirty) days.    Yes [provider]  ferrous gluconate (FERGON) 324 MG tablet Take 324 mg by mouth daily.    Yes [provider]  furosemide (LASIX) 40 MG tablet Take 40 mg by mouth daily.   Yes [provider]  hydrALAZINE (APRESOLINE) 25 MG tablet Take 1 tablet (25 mg total) by mouth 2 (two) times daily. 08/13/18  Yes [name], MD  levothyroxine (SYNTHROID) 50 MCG tablet Take 50 mcg by mouth daily before breakfast.   Yes [provider]  Multiple Vitamins-Minerals (PRESERVISION AREDS 2 PO) Take 1 tablet by mouth daily.   Yes [provider]  glucose blood (ACCU-CHEK AVIVA PLUS) test strip CHECK BLOOD SUGAR TWICE A DAY. E11.9 08/13/18   [name], MD  Lancets (ACCU-CHEK MULTICLIX) lancets USE TWICE DAILY. 03/29/15   Noralee Space, MD   No Known Allergies Review of Systems  Unable to perform ROS: Age    Physical Exam Vitals and nursing note reviewed.  Constitutional:      General: He is not in acute distress.    Appearance: He is normal  weight. He is ill-appearing.  Cardiovascular:     Rate and Rhythm: Normal rate.  Pulmonary:     Effort: Pulmonary effort is normal. No tachypnea.  Abdominal:      Palpations: Abdomen is soft.  Skin:    General: Skin is warm and dry.  Neurological:     Mental Status: He is alert and oriented to person, place, and time.  Psychiatric:     Comments: Calm and cooperative, pleasant      Vital Signs: BP (!) 136/54 (BP Location: Right Arm)   Pulse 85   Temp 98.2 F (36.8 C) (Oral)   Resp 15   Ht 5' 9"  (1.753 m)   Wt 61.3 kg   SpO2 96%   BMI 19.95 kg/m  Pain Scale: 0-10   Pain Score: 0-No pain   SpO2: SpO2: 96 % O2 Device:SpO2: 96 % O2 Flow Rate: .O2 Flow Rate (L/min): 0.5 L/min  IO: Intake/output summary:   Intake/Output Summary (Last 24 hours) at 11/11/2019 1253 Last data filed at 11/11/2019 6503 Gross per 24 hour  Intake 462 ml  Output 1725 ml  Net -1263 ml    LBM: Last BM Date: 11/08/19 Baseline Weight: Weight: 64.9 kg Most recent weight: Weight: 61.3 kg     Palliative Assessment/Data:   Flowsheet Rows     Most Recent Value  Intake Tab  Referral Department Hospitalist  Unit at Time of Referral Cardiac/Telemetry Unit  Palliative Care Primary Diagnosis Cardiac  Date Notified 11/10/19  Palliative Care Type New Palliative care  Reason for referral Clarify Goals of Care  Date of Admission 11/09/19  Date first seen by Palliative Care 11/11/19  # of days Palliative referral response time 1 Day(s)  # of days IP prior to Palliative referral 1  Clinical Assessment  Palliative Performance Scale Score 50%  Pain Max last 24 hours Not able to report  Pain Min Last 24 hours Not able to report  Dyspnea Max Last 24 Hours Not able to report  Dyspnea Min Last 24 hours Not able to report  Psychosocial & Spiritual Assessment  Palliative Care Outcomes      Time In: 1030 Time Out: 1150 Time Total: 80 minutes  Greater than 50%  of this time was spent counseling and coordinating care related to the above assessment and plan.  Signed by: Drue Novel, NP   Please contact Palliative Medicine Team phone at [phone] for questions and  concerns.  For individual provider: See Shea Evans

## 2019-11-11 NOTE — Evaluation (Signed)
Physical Therapy Evaluation Patient Details Name: Henry [name] MRN: [id number] DOB: [birth year] Today's Date: 11/11/2019   History of Present Illness  Pt is a 86 y.o. M with significant PMH of HTN, CAD s/p CABG, DM2, CKD stage 4 who presents with acute on chronic CHF and elevated troponin.  Clinical Impression  Prior to admission, pt is typically independent and lives with his wife (who is also hospitalized). He has support from multiple family members. Pt presents with decreased functional mobility secondary to decreased cardiopulmonary endurance and weakness. He is overall moving fairly well, ambulating 50 feet with a walker at a min guard assist level. SpO2 87-96% on RA, so returned to 2L O2 after mobilizing. Pt reporting mild dizziness; BP stable (138/65 sitting edge of bed). Pt will benefit from HHPT upon discharge to maximize functional independence. Would also benefit from mobilizing more during inpatient stay.     Follow Up Recommendations Home health PT;Supervision for mobility/OOB    Equipment Recommendations  Rolling walker with 5" wheels    Recommendations for Other Services       Precautions / Restrictions Precautions Precautions: Fall;Other (comment) Precaution Comments: watch O2 Restrictions Weight Bearing Restrictions: No      Mobility  Bed Mobility Overal bed mobility: Needs Assistance Bed Mobility: Supine to Sit     Supine to sit: Supervision     General bed mobility comments: Increased time, use of bed rail, no physical assist required  Transfers Overall transfer level: Needs assistance Equipment used: Rolling walker (2 wheeled) Transfers: Sit to/from Stand Sit to Stand: Min guard         General transfer comment: Cues for hand placement  Ambulation/Gait Ambulation/Gait assistance: Min guard Gait Distance (Feet): 50 Feet Assistive device: Rolling walker (2 wheeled) Gait Pattern/deviations: Step-through pattern;Decreased stride length Gait  velocity: decreased   General Gait Details: Slow and steady pace, no overt LOB  Stairs            Wheelchair Mobility    Modified Rankin (Stroke Patients Only)       Balance Overall balance assessment: Needs assistance Sitting-balance support: Feet supported Sitting balance-Leahy Scale: Good     Standing balance support: Bilateral upper extremity supported Standing balance-Leahy Scale: Poor                               Pertinent Vitals/Pain Pain Assessment: Faces Faces Pain Scale: Hurts a little bit Pain Location: mild chest pain with coughing    Home Living Family/patient expects to be discharged to:: Private residence Living Arrangements: Alone Available Help at Discharge: Family;Available PRN/intermittently Type of Home: House Home Access: Stairs to enter Entrance Stairs-Rails: None Entrance Stairs-Number of Steps: 4 (1 in back) Home Layout: Two level (chair lift) Home Equipment: Grab bars - tub/shower      Prior Function Level of Independence: Independent         Comments: Using Meals on Wheels     Hand Dominance        Extremity/Trunk Assessment   Upper Extremity Assessment Upper Extremity Assessment: Overall WFL for tasks assessed    Lower Extremity Assessment Lower Extremity Assessment: Generalized weakness    Cervical / Trunk Assessment Cervical / Trunk Assessment: Kyphotic  Communication   Communication: No difficulties  Cognition Arousal/Alertness: Awake/alert Behavior During Therapy: WFL for tasks assessed/performed Overall Cognitive Status: Within Functional Limits for tasks assessed  General Comments      Exercises     Assessment/Plan    PT Assessment Patient needs continued PT services  PT Problem List Decreased strength;Decreased activity tolerance;Decreased mobility;Decreased balance       PT Treatment Interventions DME instruction;Gait  training;Stair training;Functional mobility training;Therapeutic exercise;Therapeutic activities;Balance training;Patient/family education    PT Goals (Current goals can be found in the Care Plan section)  Acute Rehab PT Goals Patient Stated Goal: get stronger PT Goal Formulation: With patient Time For Goal Achievement: 11/25/19 Potential to Achieve Goals: Good    Frequency Min 3X/week   Barriers to discharge        Co-evaluation               AM-PAC PT "6 Clicks" Mobility  Outcome Measure Help needed turning from your back to your side while in a flat bed without using bedrails?: None Help needed moving from lying on your back to sitting on the side of a flat bed without using bedrails?: None Help needed moving to and from a bed to a chair (including a wheelchair)?: A Little Help needed standing up from a chair using your arms (e.g., wheelchair or bedside chair)?: A Little Help needed to walk in hospital room?: A Little Help needed climbing 3-5 steps with a railing? : A Lot 6 Click Score: 19    End of Session Equipment Utilized During Treatment: Gait belt;Oxygen Activity Tolerance: Patient tolerated treatment well Patient left: in chair;with call bell/phone within reach;with chair alarm set;with family/visitor present Nurse Communication: Mobility status PT Visit Diagnosis: Unsteadiness on feet (R26.81);Muscle weakness (generalized) (M62.81);Difficulty in walking, not elsewhere classified (R26.2)    Time: 2878-6767 PT Time Calculation (min) (ACUTE ONLY): 36 min   Charges:   PT Evaluation $PT Eval Moderate Complexity: 1 Mod PT Treatments $Therapeutic Activity: 8-22 mins          Wyona Almas, PT, DPT Acute Rehabilitation Services Pager [phone] Office [phone]   Deno Etienne 11/11/2019, 5:21 PM

## 2019-11-11 NOTE — Progress Notes (Signed)
Heart Failure Stewardship Pharmacist Progress Note   PCP: [name], MD PCP-Cardiologist: Sherren Mocha, MD    HPI:  86 yo M with PMH of CAD s/p CABG in 2000, chronic HF, moderate AS, HTN, HLD, PAD, DM II, and CKD IV. He presented to [facility] ED on 11/09/19 with dizziness and shortness of breath. ECHO was done on 11/10/19 and he was found to have new reduced LVEF of 35-40% and moderate to severe low flow low gradient aortic stenosis.   Current HF Medications: Furosemide 40 mg daily  Prior to admission HF Medications: Furosemide 40 mg daily Hydralazine 25 mg BID  Pertinent Lab Values: . Serum creatinine 3.47, BUN 60, Potassium 3.7, Sodium 134, BNP 979.3, Magnesium 2.1   Vital Signs: . Weight: 135 lbs (admission weight: 143 lbs) . Blood pressure: 130/60s  . Heart rate: 80s   Assessment: 1. Acute on chronic systolic CHF (EF 59-74%), thought to be due to underlying CAD vs worsening aortic stenosis. Not a good candidate for invasive workup. NYHA class III symptoms. - Holding IV lasix. Appears to be near volemic on provider exam. Cardiology is resuming furosemide 40 mg daily - Unable to tolerate BB due to AV block - No ACE/ARB/Entresto with CKD IV - Caution adding hydralazine with potential side effects of low cardiac output    Plan: 1) Medication changes recommended at this time: - No changes, limited options with comorbidities   Kerby Nora, PharmD, BCPS Heart Failure Stewardship Pharmacist Phone [phone]

## 2019-11-12 DIAGNOSIS — D649 Anemia, unspecified: Secondary | ICD-10-CM

## 2019-11-12 DIAGNOSIS — E039 Hypothyroidism, unspecified: Secondary | ICD-10-CM

## 2019-11-12 DIAGNOSIS — I10 Essential (primary) hypertension: Secondary | ICD-10-CM

## 2019-11-12 LAB — IRON AND TIBC
Iron: 19 ug/dL — ABNORMAL LOW (ref 45–182)
Saturation Ratios: 10 % — ABNORMAL LOW (ref 17.9–39.5)
TIBC: 197 ug/dL — ABNORMAL LOW (ref 250–450)
UIBC: 178 ug/dL

## 2019-11-12 LAB — BASIC METABOLIC PANEL
Anion gap: 10 (ref 5–15)
BUN: 62 mg/dL — ABNORMAL HIGH (ref 8–23)
CO2: 20 mmol/L — ABNORMAL LOW (ref 22–32)
Calcium: 9.1 mg/dL (ref 8.9–10.3)
Chloride: 102 mmol/L (ref 98–111)
Creatinine, Ser: 3.74 mg/dL — ABNORMAL HIGH (ref 0.61–1.24)
GFR calc Af Amer: 15 mL/min — ABNORMAL LOW (ref >60)
GFR calc non Af Amer: 13 mL/min — ABNORMAL LOW (ref >60)
Glucose, Bld: 105 mg/dL — ABNORMAL HIGH (ref 70–99)
Potassium: 3.7 mmol/L (ref 3.5–5.1)
Sodium: 132 mmol/L — ABNORMAL LOW (ref 135–145)

## 2019-11-12 LAB — CBC
HCT: 22.8 % — ABNORMAL LOW (ref 39.0–52.0)
Hemoglobin: 7.6 g/dL — ABNORMAL LOW (ref 13.0–17.0)
MCH: 29.7 pg (ref 26.0–34.0)
MCHC: 33.3 g/dL (ref 30.0–36.0)
MCV: 89.1 fL (ref 80.0–100.0)
Platelets: 222 10*3/uL (ref 150–400)
RBC: 2.56 MIL/uL — ABNORMAL LOW (ref 4.22–5.81)
RDW: 13.9 % (ref 11.5–15.5)
WBC: 7.5 10*3/uL (ref 4.0–10.5)
nRBC: 0 % (ref 0.0–0.2)

## 2019-11-12 LAB — FOLATE: Folate: 67.5 ng/mL (ref >5.9)

## 2019-11-12 LAB — RETICULOCYTES
Immature Retic Fract: 9.4 % (ref 2.3–15.9)
RBC.: 2.54 MIL/uL — ABNORMAL LOW (ref 4.22–5.81)
Retic Count, Absolute: 39.6 10*3/uL (ref 19.0–186.0)
Retic Ct Pct: 1.6 % (ref 0.4–3.1)

## 2019-11-12 LAB — FERRITIN: Ferritin: 662 ng/mL — ABNORMAL HIGH (ref 24–336)

## 2019-11-12 LAB — VITAMIN B12: Vitamin B-12: 713 pg/mL (ref 180–914)

## 2019-11-12 MED ORDER — FUROSEMIDE 40 MG PO TABS: 40.0000 mg | ORAL_TABLET | ORAL | Status: DC

## 2019-11-12 NOTE — Progress Notes (Addendum)
Progress Note  Patient Name: Henry [name] Date of Encounter: 11/12/2019  [facility] Cardiologist: Sherren Mocha, MD   Subjective   Eating breakfast. His main complain is cough. Breathing stable. Seen by Palliative care yesterday.   Inpatient Medications    Scheduled Meds:  amLODipine  10 mg Oral QPM   aspirin EC  81 mg Oral Daily   atorvastatin  40 mg Oral QPM   calcitRIOL  0.25 mcg Oral Weekly   calcium acetate  667 mg Oral BID WC   furosemide  40 mg Oral Daily   influenza vaccine adjuvanted  0.5 mL Intramuscular Tomorrow-1000   levothyroxine  50 mcg Oral QAC breakfast   multivitamin  1 tablet Oral QHS   sodium chloride flush  3 mL Intravenous Q12H   Continuous Infusions:  sodium chloride     PRN Meds: sodium chloride, acetaminophen, benzonatate, nitroGLYCERIN, ondansetron (ZOFRAN) IV, sodium chloride flush   Vital Signs    Vitals:   11/11/19 0923 11/11/19 1445 11/11/19 2118 11/12/19 0645  BP: (!) 136/54 140/70 123/69 140/60  Pulse: 85 90 83 77  Resp: 15 20 15 15   Temp: 98.2 F (36.8 C) 98.5 F (36.9 C) 98.7 F (37.1 C) 98.6 F (37 C)  TempSrc: Oral Oral Oral Oral  SpO2: 96% 98% 100% 98%  Weight:    61.6 kg  Height:        Intake/Output Summary (Last 24 hours) at 11/12/2019 0841 Last data filed at 11/11/2019 1930 Gross per 24 hour  Intake 804 ml  Output 725 ml  Net 79 ml   Last 3 Weights 11/12/2019 11/11/2019 11/10/2019  Weight (lbs) 135 lb 14.4 oz 135 lb 1.6 oz 136 lb 4.8 oz  Weight (kg) 61.644 kg 61.281 kg 61.825 kg      Telemetry    SR with PVCS - Personally Reviewed  ECG    N/A  Physical Exam   GEN: No acute distress.   Neck: No JVD Cardiac: RRR, 3/6 systolic murmurs, rubs, or gallops.  Respiratory: Clear to auscultation bilaterally. GI: Soft, nontender, non-distended  MS: No edema; No deformity. Neuro:  Nonfocal  Psych: Normal affect   Labs    High Sensitivity Troponin:   Recent Labs  Lab 11/09/19 1006  11/09/19 1224 11/09/19 1531 11/09/19 1913  TROPONINIHS 439* 489* 613* 724*      Chemistry Recent Labs  Lab 11/09/19 1006 11/09/19 1006 11/10/19 0507 11/11/19 0425 11/12/19 0610  NA 135   < > 134* 134* 132*  K 4.1   < > 3.8 3.7 3.7  CL 103   < > 104 104 102  CO2 18*   < > 18* 18* 20*  GLUCOSE 132*   < > 117* 108* 105*  BUN 51*   < > 55* 60* 62*  CREATININE 3.35*   < > 3.39* 3.47* 3.74*  CALCIUM 9.3   < > 9.4 9.3 9.1  PROT 7.0  --   --   --   --   ALBUMIN 3.6  --   --   --   --   AST 21  --   --   --   --   ALT 16  --   --   --   --   ALKPHOS 79  --   --   --   --   BILITOT 0.9  --   --   --   --   GFRNONAA 15*   < > 15* 14* 13*  GFRAA 17*   < > 17* 17* 15*  ANIONGAP 14   < > 12 12 10    < > = values in this interval not displayed.     Hematology Recent Labs  Lab 11/09/19 1006 11/10/19 0507 11/12/19 0610  WBC 9.9 7.6 7.5  RBC 2.75* 2.74* 2.56*  HGB 8.4* 8.3* 7.6*  HCT 26.0* 25.3* 22.8*  MCV 94.5 92.3 89.1  MCH 30.5 30.3 29.7  MCHC 32.3 32.8 33.3  RDW 14.5 14.5 13.9  PLT 247 237 222    BNP Recent Labs  Lab 11/09/19 1006  BNP 979.3*     DDimer  Recent Labs  Lab 11/09/19 1006  DDIMER 1.32*     Radiology    DG CHEST PORT 1 VIEW  Result Date: 11/11/2019 CLINICAL DATA:  Cough EXAM: PORTABLE CHEST 1 VIEW COMPARISON:  11/09/2019 FINDINGS: Bilateral airspace opacities again noted in the mid and lower lung zones. No significant change since prior study. Heart is borderline in size. Prior CABG. No effusions or pneumothorax. IMPRESSION: Bilateral airspace opacities, not significantly changed since prior study. Electronically Signed   By: Rolm Baptise M.D.   On: 11/11/2019 09:27   ECHOCARDIOGRAM COMPLETE  Result Date: 11/10/2019    ECHOCARDIOGRAM REPORT   Patient Name:   Henry [name] [facility] Date of Exam: 11/10/2019 Medical Rec #:  [id number]        Height:       69.0 in Accession #:    [id number]       Weight:       136.3 lb Date of Birth:  05-22-1924         BSA:           1.755 m Patient Age:    86 years         BP:           146/62 mmHg Patient Gender: M                HR:           86 bpm. Exam Location:  Inpatient Procedure: 2D Echo, Cardiac Doppler and Color Doppler Indications:    Elevated Troponin.  History:        Patient has prior history of Echocardiogram examinations, most                 recent 06/18/2019. Previous Myocardial Infarction and CAD,                 Carotid Disease; Risk Factors:Hypertension, Diabetes and                 Dyslipidemia.  Sonographer:    Tiffany Dance Referring Phys: [id number] RONDELL A SMITH IMPRESSIONS  1. Left ventricular ejection fraction, by estimation, is 35 to 40%. The left ventricle has moderately decreased function. The left ventricle demonstrates global hypokinesis. Left ventricular diastolic function could not be evaluated.  2. Right ventricular systolic function is mildly reduced. The right ventricular size is normal. There is normal pulmonary artery systolic pressure.  3. Left atrial size was mildly dilated.  4. The mitral valve is normal in structure. Mild mitral valve regurgitation. No evidence of mitral stenosis.  5. The aortic valve is tricuspid. There is severe calcifcation of the aortic valve. There is moderate thickening of the aortic valve. Aortic valve regurgitation is moderate. Moderate aortic valve stenosis. Aortic valve area, by VTI measures 0.92 cm. Aortic valve mean gradient measures 18.0 mmHg. Aortic valve Vmax measures 2.87 m/s. The  dimensionless index is low at 0.26 and consistent with more severe AS. SV index is reduced at 31. The AV mean gradient is likely underestimated in the setting of LV dysfunction and this likely represents at least moderate to severe low flow low gradient aortic stenosis.  6. Aortic dilatation noted. There is borderline dilatation of the aortic root, measuring 37 mm. There is dilatation of the ascending aorta, measuring 38 mm.  7. The inferior vena cava is normal in size with greater  than 50% respiratory variability, suggesting right atrial pressure of 3 mmHg. FINDINGS  Left Ventricle: Left ventricular ejection fraction, by estimation, is 35 to 40%. The left ventricle has moderately decreased function. The left ventricle demonstrates global hypokinesis. The left ventricular internal cavity size was normal in size. There is no left ventricular hypertrophy. Left ventricular diastolic function could not be evaluated. Right Ventricle: The right ventricular size is normal. No increase in right ventricular wall thickness. Right ventricular systolic function is mildly reduced. There is normal pulmonary artery systolic pressure. The tricuspid regurgitant velocity is 2.52 m/s, and with an assumed right atrial pressure of 3 mmHg, the estimated right ventricular systolic pressure is 74.9 mmHg. Left Atrium: Left atrial size was mildly dilated. Right Atrium: Right atrial size was normal in size. Pericardium: There is no evidence of pericardial effusion. Mitral Valve: The mitral valve is normal in structure. There is mild thickening of the anterior mitral valve leaflet(s). There is mild calcification of the anterior mitral valve leaflet(s). Mild to moderate mitral annular calcification. Mild mitral valve  regurgitation. No evidence of mitral valve stenosis. Tricuspid Valve: The tricuspid valve is normal in structure. Tricuspid valve regurgitation is trivial. No evidence of tricuspid stenosis. Aortic Valve: The aortic valve is tricuspid. There is severe calcifcation of the aortic valve. There is moderate thickening of the aortic valve. Aortic valve regurgitation is moderate. Aortic regurgitation PHT measures 225 msec. Moderate to severe aortic  stenosis is present. Aortic valve mean gradient measures 18.0 mmHg. Aortic valve peak gradient measures 32.9 mmHg. Aortic valve area, by VTI measures 0.92 cm. Pulmonic Valve: The pulmonic valve was normal in structure. Pulmonic valve regurgitation is not visualized. No  evidence of pulmonic stenosis. Aorta: Aortic dilatation noted. There is borderline dilatation of the aortic root, measuring 37 mm. There is dilatation of the ascending aorta, measuring 38 mm. Venous: The inferior vena cava is normal in size with greater than 50% respiratory variability, suggesting right atrial pressure of 3 mmHg. IAS/Shunts: No atrial level shunt detected by color flow Doppler.  LEFT VENTRICLE PLAX 2D LVIDd:         5.10 cm LVIDs:         4.40 cm LV PW:         1.20 cm LV IVS:        0.90 cm LVOT diam:     2.10 cm LV SV:         55 LV SV Index:   31 LVOT Area:     3.46 cm  RIGHT VENTRICLE RV Basal diam:  2.70 cm RV Mid diam:    1.90 cm RV S prime:     8.78 cm/s TAPSE (M-mode): 1.4 cm LEFT ATRIUM             Index       RIGHT ATRIUM           Index LA diam:        4.80 cm 2.73 cm/m  RA Area:  14.70 cm LA Vol (A2C):   73.7 ml 41.99 ml/m RA Volume:   31.50 ml  17.95 ml/m LA Vol (A4C):   71.5 ml 40.73 ml/m LA Biplane Vol: 73.8 ml 42.04 ml/m  AORTIC VALVE AV Area (Vmax):    0.91 cm AV Area (Vmean):   0.95 cm AV Area (VTI):     0.92 cm AV Vmax:           287.00 cm/s AV Vmean:          196.333 cm/s AV VTI:            0.602 m AV Peak Grad:      32.9 mmHg AV Mean Grad:      18.0 mmHg LVOT Vmax:         75.20 cm/s LVOT Vmean:        53.700 cm/s LVOT VTI:          0.159 m LVOT/AV VTI ratio: 0.26 AI PHT:            225 msec  AORTA Ao Root diam: 3.70 cm Ao Asc diam:  3.80 cm MITRAL VALVE                TRICUSPID VALVE MV Area (PHT): 5.54 cm     TR Peak grad:   25.4 mmHg MV Decel Time: 137 msec     TR Vmax:        252.00 cm/s MV E velocity: 132.00 cm/s MV A velocity: 124.00 cm/s  SHUNTS MV E/A ratio:  1.06         Systemic VTI:  0.16 m                             Systemic Diam: 2.10 cm Fransico Him MD Electronically signed by Fransico Him MD Signature Date/Time: 11/10/2019/3:04:25 PM    Final     Cardiac Studies   Echo 11/10/2019 1. Left ventricular ejection fraction, by estimation, is 35 to  40%. The  left ventricle has moderately decreased function. The left ventricle  demonstrates global hypokinesis. Left ventricular diastolic function could  not be evaluated.  2. Right ventricular systolic function is mildly reduced. The right  ventricular size is normal. There is normal pulmonary artery systolic  pressure.  3. Left atrial size was mildly dilated.  4. The mitral valve is normal in structure. Mild mitral valve  regurgitation. No evidence of mitral stenosis.  5. The aortic valve is tricuspid. There is severe calcifcation of the  aortic valve. There is moderate thickening of the aortic valve. Aortic  valve regurgitation is moderate. Moderate aortic valve stenosis. Aortic  valve area, by VTI measures 0.92 cm.  Aortic valve mean gradient measures 18.0 mmHg. Aortic valve Vmax measures  2.87 m/s. The dimensionless index is low at 0.26 and consistent with more  severe AS. SV index is reduced at 31. The AV mean gradient is likely  underestimated in the setting of LV  dysfunction and this likely represents at least moderate to severe low  flow low gradient aortic stenosis.  6. Aortic dilatation noted. There is borderline dilatation of the aortic  root, measuring 37 mm. There is dilatation of the ascending aorta,  measuring 38 mm.  7. The inferior vena cava is normal in size with greater than 50%  respiratory variability, suggesting right atrial pressure of 3 mmHg.   Patient Profile     86 y.o. male with CAD s/p CABG  8756, chronic diastolic CHF, previously moderate AS, HTN, HLD, PAD, DM II and CKD stage IV presented with acute on chronic diastolic CHF likely due to progression of his aortic stenosis.   Assessment & Plan    1. Acute on chronic combined CHF - Echo this admission LVEF of 35-45% (was 50-55% 06/18/19).  Mild MR, moderate AI and AS, the AV mean gradient likely underestimated in the setting of LV dysfunction.  - Likely low gradient AS - Limited options due to  age and commodities.  - Lasix changed to po 40mg  daily yesterday however Scr continues to get worse since admit 3.35>>3.39>>3.47>>3.74 today. His baseline renal function is around 3.  - He already got morning dose of lasix. Will review dose with MD (likely reduce) or could consider nephrology consult (followed by Dr. Marval Regal) - Net I & O -2.1L since admit - Avoid hypotension and over diuresis   2. Severe AS - Not a surgical/TVAR candidate. Seen by Dr. Copper - Now on palliative care  3. CAD s/p CABG - Occasional chest pain. Currently free.  - Continue ASA and statin   4. HTN - BP stable on current meds  5. Anemia - HGb down trending, 7.6 today. Per primary    For questions or updates, please contact [facility] Please consult [email or web address] for contact info under        Signed, Leanor Kail, PA  11/12/2019, 8:41 AM    I have seen and examined the patient along with Leanor Kail, PA .  I have reviewed the chart, notes and new data.  I agree with PA/NP's note.  Key new complaints: very little dizziness when he walked with PT yesterday. No dyspnea today. Key examination changes: absent S2, late peaking Ao ej murmur, no JVD, no edema, clear lungs Key new findings / data: O2 sat 97% at rest on RA, creat up.  PLAN: Seems to be at optimal volume status today, will be challenging to keep him there. Change furosemide to every other day. Continue plans for palliative care. I think he will need home O2, a hospital bed and bedside commode at a minimum. Estimate longevity at 6 months, at most 12 months.  Sanda Klein, MD, [place] [phone] 11/12/2019, 9:58 AM

## 2019-11-12 NOTE — Progress Notes (Signed)
   11/12/19 1201  Clinical Encounter Type  Visited With Patient and family together  Visit Type Initial;Spiritual support;Social support  Referral From Family  Consult/Referral To Chaplain  This chaplain responded to family request for Pt. Spiritual care.  The Pt. daughters-[name] and [name] requested a Pt. visit after visiting the Pt. Wife-Rosemary.  The Pt. was appreciative of the chaplain's time with and update on Rosemary.  The chaplain was able to make geographical and faith connections with the Pt. In his story telling. The Pt. daughter joined her father mid-way through the visit and the Pt. and family are open to F/U spiritual care visits.

## 2019-11-12 NOTE — Progress Notes (Signed)
SATURATION QUALIFICATIONS: (This note is used to comply with regulatory documentation for home oxygen)  Patient Saturations on Room Air at Rest = 96%  Patient Saturations on Room Air while Ambulating = 87%  Patient Saturations on 2 Liters of oxygen while Ambulating = 96%  Please briefly explain why patient needs home oxygen: Desaturation with ambulation

## 2019-11-12 NOTE — Evaluation (Signed)
Occupational Therapy Evaluation Patient Details Name: Henry [name] MRN: [id number] DOB: 1924/03/25 Today's Date: 11/12/2019    History of Present Illness Pt is a 86 y.o. M with significant PMH of HTN, CAD s/p CABG, DM2, CKD stage 4 who presents with acute on chronic CHF and elevated troponin.   Clinical Impression   Patient admitted with the above diagnosis.  Presents with generalized weakness and decreased stand balance placing him at a fall risk.  Currently he requires supervision with seated ADL's and Min Guard when standing at RW level.  He did de-sat to 88% with exertion, but rebounded to 97% on RA with seated break and cues to deep breath.  Patient's spouse is transitioning to hospice care, and he will require initial 24 hour supervision/assist as needed at home with recommended home health OT.  Continue to follow in the acute care setting to improve overall strength and aerobic capacity to maximize functional status.      Follow Up Recommendations    [facility] OT   Equipment Recommendations    Shower chair.     Recommendations for Other Services       Precautions / Restrictions Precautions Precautions: Fall Precaution Comments: monitor O2 Restrictions Weight Bearing Restrictions: No      Mobility Bed Mobility   Bed Mobility: Supine to Sit     Supine to sit: Modified independent (Device/Increase time)        Transfers Overall transfer level: Needs assistance Equipment used: Rolling walker (2 wheeled) Transfers: Sit to/from Stand Sit to Stand: Min guard              Balance Overall balance assessment: Needs assistance Sitting-balance support: Feet supported Sitting balance-Leahy Scale: Good     Standing balance support: Bilateral upper extremity supported Standing balance-Leahy Scale: Fair Standing balance comment: cues for RW management.  slide RW when turning and not picking it up.                           ADL either performed or assessed  with clinical judgement   ADL Overall ADL's : Needs assistance/impaired Eating/Feeding: Independent;Sitting   Grooming: Wash/dry hands;Wash/dry face;Oral care;Set up;Sitting   Upper Body Bathing: Supervision/ safety;Sitting   Lower Body Bathing: Supervison/ safety;Sit to/from stand       Lower Body Dressing: Supervision/safety;Sit to/from stand Lower Body Dressing Details (indicate cue type and reason): RW for stability Toilet Transfer: Supervision/safety;Ambulation;RW           Functional mobility during ADLs: Minimal assistance General ADL Comments: small LOB with transitional movements.     Vision Baseline Vision/History: Wears glasses Wears Glasses: At all times Patient Visual Report: No change from baseline Vision Assessment?: No apparent visual deficits     Perception     Praxis      Pertinent Vitals/Pain       Hand Dominance Right   Extremity/Trunk Assessment Upper Extremity Assessment Upper Extremity Assessment: Overall WFL for tasks assessed   Lower Extremity Assessment Lower Extremity Assessment: Defer to PT evaluation   Cervical / Trunk Assessment Cervical / Trunk Assessment: Kyphotic   Communication Communication Communication: No difficulties   Cognition Arousal/Alertness: Awake/alert Behavior During Therapy: WFL for tasks assessed/performed Overall Cognitive Status: Within Functional Limits for tasks assessed  Home Living Family/patient expects to be discharged to:: Private residence Living Arrangements: Alone (spouse is going to hospice care) Available Help at Discharge: Family;Available PRN/intermittently Type of Home: House Home Access: Stairs to enter     Home Layout: Two level;Able to live on main level with bedroom/bathroom     Bathroom Shower/Tub: Occupational psychologist: Standard     Home Equipment: Grab bars - tub/shower           Prior Functioning/Environment Level of Independence: Independent        Comments: Using Meals on Wheels        OT Problem List:        OT Treatment/Interventions:      OT Goals(Current goals can be found in the care plan section) Acute Rehab OT Goals Patient Stated Goal: Take care of myself again. OT Goal Formulation: With patient Time For Goal Achievement: 11/30/19 Potential to Achieve Goals: Good ADL Goals Pt Will Perform Lower Body Bathing: with modified independence;sit to/from stand Pt Will Perform Lower Body Dressing: with modified independence;sit to/from stand Pt Will Transfer to Toilet: with modified independence;ambulating  OT Frequency:  2x/wk   Barriers to D/C:  Medical status          Co-evaluation              AM-PAC OT "6 Clicks" Daily Activity     Outcome Measure                 End of Session    Activity Tolerance:   Patient left:                     Time: 1430-1503 OT Time Calculation (min): 33 min Charges:  OT General Charges $OT Visit: 1 Visit OT Evaluation $OT Eval Moderate Complexity: 1 Mod OT Treatments $Self Care/Home Management : 8-22 mins  11/12/2019  Rich, OTR/L  Acute Rehabilitation Services  Office:  [phone]   Metta Clines 11/12/2019, 3:11 PM

## 2019-11-12 NOTE — TOC Initial Note (Signed)
Transition of Care [facility]) - Initial/Assessment Note    Patient Details  Name: Henry [name] MRN: [id number] Date of Birth: 07/07/1924  Transition of Care [facility]) CM/SW Contact:    Bethena Roys, RN Phone Number: 11/12/2019, 5:07 PM  Clinical Narrative:  Patient presented for dizziness and cough. Prior to arrival patient was from home with spouse. Spouse is currently hospitalized as well. Daughter [name] and [name] was at the bedside during the conversation. Palliative was consulted, and spoke with family on 11-11-19. Palliative recommendations were for outpatient palliative. We discussed transition of care needs for the patient and both patient and family were agreeable to outpatient palliative services via [facility]. Referral was made and the office will call the daughter regarding plan of care. Medicare.gov list provided to this patient and the family chose [place] for home health services. Referral was made to [facility] and start of care to begin within 24-48 hours of transition home. Services will include registered nurse, physical therapy, occupational therapy, and aide. Durable medical equipment (DME) includes: oxygen and hospital bed- family is agreeable to use [employer] for services. [employer] notified to call daughter with cost of hospital bed prior to processing. Family is agreeable to both the 02 and the hospital bed. Case Manager will continue to follow for additional transition of care needs.               Expected Discharge Plan: [place] Barriers to Discharge: No Barriers Identified   Patient Goals and CMS Choice Patient states their goals for this hospitalization and ongoing recovery are:: to return home with home health and palliative services CMS Medicare.gov Compare Post Acute Care list provided to:: Patient Choice offered to / list presented to : Patient  Expected Discharge Plan and Services Expected Discharge Plan: [place] In-house Referral: Hospice / Palliative Care Discharge Planning Services: CM Consult Post Acute Care Choice: Durable Medical Equipment, Home Health Living arrangements for the past 2 months: [place]                 DME Arranged: Oxygen, Hospital bed DME Agency: [employer] Date DME Agency Contacted: 11/12/19 Time DME Agency Contacted: 0932 Representative spoke with at DME Agency: [place]: RN, Disease Management, PT, OT, Nurse's Aide [place] Agency: [place] Date [place]: 11/12/19 Time [place]: 1705 Representative spoke with at [place] Arrangements/Services Living arrangements for the past 2 months: [place] with:: Spouse Patient language and need for interpreter reviewed:: Yes Do you feel safe going back to the place where you live?: Yes      Need for Family Participation in Patient Care: Yes (Comment) Care giver support system in place?: Yes (comment)   Criminal Activity/Legal Involvement Pertinent to Current Situation/Hospitalization: No - Comment as needed  Activities of Daily Living Home Assistive Devices/Equipment: None ADL Screening (condition at time of admission) Patient's cognitive ability adequate to safely complete daily activities?: Yes Is the patient deaf or have difficulty hearing?: No Does the patient have difficulty seeing, even when wearing glasses/contacts?: No Does the patient have difficulty concentrating, remembering, or making decisions?: No Patient able to express need for assistance with ADLs?: Yes Does the patient have difficulty dressing or bathing?: No Independently performs ADLs?: Yes (appropriate for developmental age) Does the patient have difficulty walking or climbing stairs?: No Weakness of Legs: None Weakness of Arms/Hands: None  Permission Sought/Granted Permission  sought to share information with : Family Supports, Forensic psychologist, Chief Strategy Officer granted to share information with : Yes, Hospital doctor     Permission granted to share info w AGENCY: [place], [facility]        Emotional Assessment Appearance:: Appears stated age Attitude/Demeanor/Rapport: Engaged Affect (typically observed): Appropriate Orientation: : Oriented to Self, Oriented to Place, Oriented to  Time, Oriented to Situation Alcohol / Substance Use: Not Applicable Psych Involvement: No (comment)  Admission diagnosis:  Shortness of breath [R06.02] Precordial pain [R07.2] Elevated troponin [R77.8] Acute on chronic congestive heart failure ([place]) [I50.9] Patient Active Problem List   Diagnosis Date Noted  . Goals of care, counseling/discussion   . Palliative care by specialist   . Encounter for hospice care discussion   . Acute on chronic congestive heart failure ([place]) 11/09/2019  . DNR (do not resuscitate) 11/09/2019  . Mobitz type 1 second degree atrioventricular block 05/02/2018  . Chronic diastolic CHF (congestive heart failure) ([place])   . NSTEMI 02/2018 - 2/4 grafts occluded; Med Rx   . Respiratory failure with hypoxia ([place]) 03/25/2018  . Pulmonary edema 03/25/2018  . Respiratory failure, acute ([place]) 03/25/2018  . Elevated troponin 03/25/2018  . Vitamin D deficiency 01/01/2018  . CKD (chronic kidney disease) stage 5, GFR less than 15 ml/min (HCC) 07/24/2017  . Carotid artery disease ([place])   . Hypertension   . PVD (peripheral vascular disease) with claudication ([place])   . Peripheral vascular disease with claudication ([place]) 01/22/2017  . Aortic valve stenosis, unspecified etiology 01/22/2017  . Postinflammatory pulmonary fibrosis ([place]) 07/31/2016  . Acute bronchitis 03/07/2016  . Hypothyroidism 08/31/2014  . Carotid stenosis 08/19/2012  . DJD (degenerative joint disease) 08/19/2012  . LBP (low back pain) 08/19/2012  . Cerebral atrophy ([place]) 08/19/2012  . Dizziness  04/13/2012  . Diabetes mellitus type 2, uncomplicated ([place]) 12/12/5100  . Nausea and vomiting 04/10/2012  . CAD (coronary artery disease) 07/13/2008  . HYPERCHOLESTEROLEMIA 04/04/2007  . Anemia of chronic disease 04/04/2007   PCP:  [name], MD Pharmacy:   [place], [place] - [address] [place] [place] [address] Phone: [phone] Fax: [phone]    Readmission Risk Interventions No flowsheet data found.

## 2019-11-12 NOTE — Progress Notes (Signed)
[facility]  PROGRESS NOTE  Henry [name] FBP:[id number] DOB: 09/21/1924 DOA: 11/09/2019 PCP: [name], MD   Brief HPI:   86 year old male with history of hypertension, hyperlipidemia, CAD s/p CABG, moderate aortic stenosis, diabetes mellitus type 2, CKD stage IV presented with complaints of dizziness and cough.  Upon admission BNP was elevated 979.3.  Troponin was elevated to [phone].  Chest x-ray showed cardiomegaly with interstitial edema and bilateral pleural effusion compatible with congestive heart failure.  Cardiology was formally consulted.  Patient was found to have severe aortic stenosis but not a candidate for TAVR due to potential risk of developing into ESRD.  Palliative care has been consulted.    Subjective   Patient seen and examined, still complains of cough.  Denies chest pain or shortness of breath.   Assessment/Plan:     1. Acute on chronic diastolic congestive heart failure-patient presented with complaints of cough and shortness of breath.  Chest x-ray showed cardiomegaly with interstitial edema and bilateral pleural effusions compatible with CHF exacerbation.  BNP was elevated at 917.3.  Echocardiogram showed EF 55% with grade 2 diastolic dysfunction on 4/43/1540.  Cardiology was consulted patient started on IV Lasix.  At this time Lasix has been changed to 40 mg p.o. daily. 2. Aortic stenosis-progression to D1 severe disease noted per cardiology.  Patient is not a candidate for TAVR due to high risk of progression to ESRD with no hemodialysis access available and patient will not be a candidate for dialysis.  Palliative care consulted after cardiology discussed with nephrology. 3. Elevated troponin-patient has history of CAD s/p CABG.  Troponin elevated [phone].  Last cardiac cath in January 2020 showed severe three-vessel disease which was not amicable to PCI.  Continue aspirin, Plavix, statin. 4. Acute kidney injury superimposed on CKD stage  IV-patient baseline creatinine has been around 3, on 08/13/2019.  Patient presented with creatinine of 3.35 with BUN 51.  Suspected hypoperfusion due to CHF.  Patient followed by nephrology as outpatient.  Creatinine is elevated at 3.74 today. 5. Hypertension-patient blood pressure is stable.  Started on home medications including amlodipine 10 mg daily, furosemide 40 mg daily, hydralazine 25 mg p.o. twice daily.  Hydralazine has been on hold due to aortic stenosis. Continue amlodipine. 6. Anemia-hemoglobin has dropped from 8.3-7.6 today.  Will check FOBT, anemia panel.  Follow CBC in a.m. 7. Hypothyroidism-continue Synthroid 8. Hyperlipidemia-continue statin.     COVID-19 Labs  Recent Labs    11/12/19 1223  FERRITIN 662*    Lab Results  Component Value Date   SARSCOV2NAA NEGATIVE 11/09/2019   [place] NEGATIVE 07/14/2018     Scheduled medications:   . amLODipine  10 mg Oral QPM  . aspirin EC  81 mg Oral Daily  . atorvastatin  40 mg Oral QPM  . calcitRIOL  0.25 mcg Oral Weekly  . calcium acetate  667 mg Oral BID WC  . [START ON 11/14/2019] furosemide  40 mg Oral QODAY  . influenza vaccine adjuvanted  0.5 mL Intramuscular Tomorrow-1000  . levothyroxine  50 mcg Oral QAC breakfast  . multivitamin  1 tablet Oral QHS  . sodium chloride flush  3 mL Intravenous Q12H     CBG: No results for input(s): GLUCAP in the last 168 hours.  SpO2: 98 % O2 Flow Rate (L/min): 0.5 L/min    CBC: Recent Labs  Lab 11/09/19 1006 11/10/19 0507 11/12/19 0610  WBC 9.9 7.6 7.5  NEUTROABS 7.6  --   --  HGB 8.4* 8.3* 7.6*  HCT 26.0* 25.3* 22.8*  MCV 94.5 92.3 89.1  PLT 247 237 371    Basic Metabolic Panel: Recent Labs  Lab 11/09/19 1006 11/10/19 0507 11/11/19 0425 11/12/19 0610  NA 135 134* 134* 132*  K 4.1 3.8 3.7 3.7  CL 103 104 104 102  CO2 18* 18* 18* 20*  GLUCOSE 132* 117* 108* 105*  BUN 51* 55* 60* 62*  CREATININE 3.35* 3.39* 3.47* 3.74*  CALCIUM 9.3 9.4 9.3 9.1  MG  2.1  --   --   --      Liver Function Tests: Recent Labs  Lab 11/09/19 1006  AST 21  ALT 16  ALKPHOS 79  BILITOT 0.9  PROT 7.0  ALBUMIN 3.6     Antibiotics: Anti-infectives (From admission, onward)   None       DVT prophylaxis: SCDs  Code Status: DNR  Family Communication: Discussed with patient's daughter at bedside    Status is: Inpatient  Dispo: The patient is from: Home              Anticipated d/c is to: Home              Anticipated d/c date is: 11/13/2019              Patient currently not medically stable for discharge  Barrier to discharge-ongoing treatment for acute on chronic diastolic heart failure     Consultants:  Cardiology  Procedures:  2D echo   Objective   Vitals:   11/11/19 0923 11/11/19 1445 11/11/19 2118 11/12/19 0645  BP: (!) 136/54 140/70 123/69 140/60  Pulse: 85 90 83 77  Resp: 15 20 15 15   Temp: 98.2 F (36.8 C) 98.5 F (36.9 C) 98.7 F (37.1 C) 98.6 F (37 C)  TempSrc: Oral Oral Oral Oral  SpO2: 96% 98% 100% 98%  Weight:    61.6 kg  Height:        Intake/Output Summary (Last 24 hours) at 11/12/2019 1546 Last data filed at 11/11/2019 1930 Gross per 24 hour  Intake 222 ml  Output 725 ml  Net -503 ml    09/14 1901 - 09/16 0700 In: 0626 [P.O.:1044] Out: 1575 [RSWNI:6270]  Filed Weights   11/10/19 0435 11/11/19 0536 11/12/19 0645  Weight: 61.8 kg 61.3 kg 61.6 kg    Physical Examination:   General-appears in no acute distress Heart-S1-S2, regular, no murmur auscultated Lungs-bibasilar crackles Abdomen-soft, nontender, no organomegaly Extremities-no edema in the lower extremities Neuro-alert, oriented x3, no focal deficit noted   Data Reviewed:   Recent Results (from the past 240 hour(s))  SARS Coronavirus 2 by RT PCR (hospital order, performed in [place] hospital lab) Nasopharyngeal Nasopharyngeal Swab     Status: None   Collection Time: 11/09/19 10:03 AM   Specimen: Nasopharyngeal Swab   Result Value Ref Range Status   SARS Coronavirus 2 NEGATIVE NEGATIVE Final    Comment: (NOTE) SARS-CoV-2 target nucleic acids are NOT DETECTED.  The SARS-CoV-2 RNA is generally detectable in upper and lower respiratory specimens during the acute phase of infection. The lowest concentration of SARS-CoV-2 viral copies this assay can detect is 250 copies / mL. A negative result does not preclude SARS-CoV-2 infection and should not be used as the sole basis for treatment or other patient management decisions.  A negative result may occur with improper specimen collection / handling, submission of specimen other than nasopharyngeal swab, presence of viral mutation(s) within the areas targeted by this  assay, and inadequate number of viral copies (<250 copies / mL). A negative result must be combined with clinical observations, patient history, and epidemiological information.  Fact Sheet for Patients:   StrictlyIdeas.no  Fact Sheet for Healthcare Providers: BankingDealers.co.za  This test is not yet approved or  cleared by the [place] FDA and has been authorized for detection and/or diagnosis of SARS-CoV-2 by FDA under an Emergency Use Authorization (EUA).  This EUA will remain in effect (meaning this test can be used) for the duration of the COVID-19 declaration under Section 564(b)(1) of the Act, 21 U.S.C. section 360bbb-3(b)(1), unless the authorization is terminated or revoked sooner.  Performed at [facility], [place] [address] [place], [place] [address]     Recent Labs  Lab 11/09/19 1006  LIPASE 24   No results for input(s): AMMONIA in the last 168 hours.  Cardiac Enzymes: No results for input(s): CKTOTAL, CKMB, CKMBINDEX, TROPONINI in the last 168 hours. BNP (last 3 results) Recent Labs    11/09/19 1006  BNP 979.3*     Studies:  DG CHEST PORT 1 VIEW  Result Date: 11/11/2019 CLINICAL DATA:  Cough EXAM:  PORTABLE CHEST 1 VIEW COMPARISON:  11/09/2019 FINDINGS: Bilateral airspace opacities again noted in the mid and lower lung zones. No significant change since prior study. Heart is borderline in size. Prior CABG. No effusions or pneumothorax. IMPRESSION: Bilateral airspace opacities, not significantly changed since prior study. Electronically Signed   By: Rolm Baptise M.D.   On: 11/11/2019 09:27       [place]   [facility] If 7PM-7AM, please contact night-coverage at [email or web address], Office  [phone]   11/12/2019, 3:46 PM  LOS: 3 days

## 2019-11-13 ENCOUNTER — Telehealth: Payer: Self-pay | Admitting: Internal Medicine

## 2019-11-13 DIAGNOSIS — D638 Anemia in other chronic diseases classified elsewhere: Secondary | ICD-10-CM

## 2019-11-13 LAB — BASIC METABOLIC PANEL
Anion gap: 11 (ref 5–15)
BUN: 67 mg/dL — ABNORMAL HIGH (ref 8–23)
CO2: 20 mmol/L — ABNORMAL LOW (ref 22–32)
Calcium: 8.7 mg/dL — ABNORMAL LOW (ref 8.9–10.3)
Chloride: 101 mmol/L (ref 98–111)
Creatinine, Ser: 3.74 mg/dL — ABNORMAL HIGH (ref 0.61–1.24)
GFR calc Af Amer: 15 mL/min — ABNORMAL LOW (ref >60)
GFR calc non Af Amer: 13 mL/min — ABNORMAL LOW (ref >60)
Glucose, Bld: 103 mg/dL — ABNORMAL HIGH (ref 70–99)
Potassium: 3.5 mmol/L (ref 3.5–5.1)
Sodium: 132 mmol/L — ABNORMAL LOW (ref 135–145)

## 2019-11-13 LAB — CBC
HCT: 22.8 % — ABNORMAL LOW (ref 39.0–52.0)
Hemoglobin: 7.6 g/dL — ABNORMAL LOW (ref 13.0–17.0)
MCH: 29.8 pg (ref 26.0–34.0)
MCHC: 33.3 g/dL (ref 30.0–36.0)
MCV: 89.4 fL (ref 80.0–100.0)
Platelets: 235 10*3/uL (ref 150–400)
RBC: 2.55 MIL/uL — ABNORMAL LOW (ref 4.22–5.81)
RDW: 13.6 % (ref 11.5–15.5)
WBC: 7 10*3/uL (ref 4.0–10.5)
nRBC: 0 % (ref 0.0–0.2)

## 2019-11-13 MED ORDER — FUROSEMIDE 40 MG PO TABS
40.0000 mg | ORAL_TABLET | ORAL | 2 refills | Status: DC
Start: 2019-11-14 — End: 2020-03-14

## 2019-11-13 NOTE — Discharge Summary (Signed)
Physician Discharge Summary  Henry [name]:[id number] DOB: May 01, 1924 DOA: 11/09/2019  PCP: Henry Borg, MD  Admit date: 11/09/2019 Discharge date: 11/13/2019  Time spent: 50 minutes  Recommendations for Outpatient Follow-up:  1. Follow-up cardiology in 2 weeks 2. Patient to be discharged home with home oxygen, hospital bed, home health aide, home health PT   Discharge Diagnoses:  Principal Problem:   Acute on chronic congestive heart failure (HCC) Active Problems:   Anemia of chronic disease   CAD (coronary artery disease)   Hypothyroidism   Aortic valve stenosis, unspecified etiology   Hypertension   Elevated troponin   DNR (do not resuscitate)   Goals of care, counseling/discussion   Palliative care by specialist   Encounter for hospice care discussion   Discharge Condition: Stable  Diet recommendation: Heart healthy diet  Filed Weights   11/11/19 0536 11/12/19 0645 11/13/19 0500  Weight: 61.3 kg 61.6 kg 62 kg    History of present illness:  86 year old male with history of hypertension, hyperlipidemia, CAD s/p CABG, moderate aortic stenosis, diabetes mellitus type 2, CKD stage IV presented with complaints of dizziness and cough.  Upon admission BNP was elevated 979.3.  Troponin was elevated to [phone].  Chest x-ray showed cardiomegaly with interstitial edema and bilateral pleural effusion compatible with congestive heart failure.  Cardiology was formally consulted.  Patient was found to have severe aortic stenosis but not a candidate for TAVR due to potential risk of developing into ESRD.  Palliative care has been consulted.   Hospital Course:  1. Acute on chronic diastolic congestive heart failure-patient presented with complaints of cough and shortness of breath.  Chest x-ray showed cardiomegaly with interstitial edema and bilateral pleural effusions compatible with CHF exacerbation.  BNP was elevated at 917.3.  Echocardiogram showed EF 55% with grade 2  diastolic dysfunction on 8/58/8502.  Cardiology was consulted patient started on IV Lasix.  At this time Lasix has been changed to 40 mg p.o. every other day 2. Aortic stenosis-progression to D1 severe disease noted per cardiology.  Patient is not a candidate for TAVR due to high risk of progression to ESRD with no hemodialysis access available and patient will not be a candidate for dialysis.    Palliative care was consulted.  Patient to go home with home health services with palliative care follow-up. 3. Elevated troponin-patient has history of CAD s/p CABG.  Troponin elevated [phone].  Last cardiac cath in January 2020 showed severe three-vessel disease which was not amicable to PCI.  Continue aspirin,  statin.  Will discontinue Plavix due to abduction sitting up.  Discussed with Dr. Sallyanne Kuster. 4. Acute kidney injury superimposed on CKD stage IV-patient baseline creatinine has been around 3, on 08/13/2019.  Patient presented with creatinine of 3.35 with BUN 51.  Suspected hypoperfusion due to CHF.  Patient followed by nephrology as outpatient.  Creatinine is stable at 3.74 today. 5. Hypertension-patient blood pressure is stable.  Started on home medications including amlodipine 10 mg daily, furosemide 40 mg daily, hydralazine 25 mg p.o. twice daily.  Hydralazine has been on hold due to aortic stenosis. Continue amlodipine. 6. Anemia-hemoglobin has dropped from 8.3-7.6 today.    Patient has severe and less than 70.  He takes erythropoietin injections every month.  Also continue taking Fergon. Patient to follow-up PCP as outpatient. 7. Hyperlipidemia-continue statin.  Procedures:    Consultations: Cardiology Palliative care  Discharge Exam: Vitals:   11/13/19 0500 11/13/19 1058  BP: 130/62 (!) 132/56  Pulse:  76  Resp: 18 20  Temp: 98.9 F (37.2 C) 98.4 F (36.9 C)  SpO2: 98%     General: Appears in no acute distress Cardiovascular: S1-S2, regular, no murmur auscultated Respiratory:  Clear to auscultation bilaterally  Discharge Instructions   Discharge Instructions    Diet - low sodium heart healthy   Complete by: As directed    Increase activity slowly   Complete by: As directed      Allergies as of 11/13/2019   No Known Allergies     Medication List    STOP taking these medications   Accu-Chek Aviva Plus test strip Generic drug: glucose blood   clopidogrel 75 MG tablet Commonly known as: PLAVIX   hydrALAZINE 25 MG tablet Commonly known as: APRESOLINE     TAKE these medications   accu-chek multiclix lancets USE TWICE DAILY.   amLODipine 10 MG tablet Commonly known as: NORVASC TAKE 1 TABLET BY MOUTH ONCE DAILY IN THE EVENING   aspirin EC 81 MG tablet Take 81 mg by mouth daily.   atorvastatin 40 MG tablet Commonly known as: LIPITOR Take 1 tablet (40 mg total) by mouth every evening.   b complex-vitamin c-folic acid 0.8 MG Tabs tablet Take 1 tablet by mouth at bedtime.   calcitRIOL 0.25 MCG capsule Commonly known as: ROCALTROL Take 0.25 mcg by mouth once a week. Monday   calcium acetate 667 MG capsule Commonly known as: PHOSLO Take 667 mg by mouth 2 (two) times daily with a meal.   Darbepoetin Alfa 100 MCG/0.5ML Sosy injection Commonly known as: ARANESP Inject 100 mcg into the skin every 30 (thirty) days.   ferrous gluconate 324 MG tablet Commonly known as: FERGON Take 324 mg by mouth daily.   furosemide 40 MG tablet Commonly known as: LASIX Take 1 tablet (40 mg total) by mouth every other day. Also take additional dose for weight greater than 137 lb Start taking on: November 14, 2019 What changed:   when to take this  additional instructions   levothyroxine 50 MCG tablet Commonly known as: SYNTHROID Take 50 mcg by mouth daily before breakfast.   PRESERVISION AREDS 2 PO Take 1 tablet by mouth daily.            Durable Medical Equipment  (From admission, onward)         Start     Ordered   11/12/19 1557  For  home use only DME Hospital bed  Once       Question Answer Comment  Length of Need Lifetime   Patient has (list medical condition): chf   Bed type Semi-electric      11/12/19 1556   11/12/19 1557  For home use only DME oxygen  Once       Question Answer Comment  Length of Need Lifetime   Mode or (Route) Nasal cannula   Liters per Minute 2   Frequency Continuous (stationary and portable oxygen unit needed)   Oxygen conserving device Yes   Oxygen delivery system Gas      11/12/19 1556   11/12/19 1556  For home use only DME Bedside commode  Once       Question:  Patient needs a bedside commode to treat with the following condition  Answer:  CHF (congestive heart failure) ([place])   11/12/19 1556   11/12/19 1556  For home use only DME Walker rolling  Once       Question Answer Comment  Walker: With [place]  Patient needs a walker to treat with the following condition CHF (congestive heart failure) ([place])      11/12/19 1556         No Known Allergies  Follow-up Information    [facility] Follow up.   Specialty: Home Health Services Why: Registered Nurse, Physical Therapy, Occupational Therapy, Aide-office to call family for visit times.  Contact information: [place] [address] [phone]        [facility] Follow [facility].   Specialty: Hospice and Palliative Medicine Why: [place]-office to call the patient's daighter to establish care   Contact information: [place] [phone] [facility] Follow up.   Why: Hospital Bed, Oxygen- office to call family before processing the bed 2/2 cost.  Contact information: [place] [address] [phone]        [place], Dani Gobble, MD Follow up in 2 week(s).   Specialty: Cardiology Contact information: [address] [place]  [address] [phone]                The results of significant diagnostics from this hospitalization (including imaging, microbiology, ancillary and laboratory) are listed below for reference.    Significant Diagnostic Studies: DG CHEST PORT 1 VIEW  Result Date: 11/11/2019 CLINICAL DATA:  Cough EXAM: PORTABLE CHEST 1 VIEW COMPARISON:  11/09/2019 FINDINGS: Bilateral airspace opacities again noted in the mid and lower lung zones. No significant change since prior study. Heart is borderline in size. Prior CABG. No effusions or pneumothorax. IMPRESSION: Bilateral airspace opacities, not significantly changed since prior study. Electronically Signed   By: Rolm Baptise M.D.   On: 11/11/2019 09:27   DG Chest Portable 1 View  Result Date: 11/09/2019 CLINICAL DATA:  Dizziness and cough.  Chest pain. EXAM: PORTABLE CHEST 1 VIEW COMPARISON:  Two-view chest x-ray 02/3118 FINDINGS: Heart is enlarged. Diffuse interstitial pattern is seen. Bilateral pleural effusions are present, right greater than left. Bibasilar airspace disease likely reflects atelectasis. Median sternotomy noted. IMPRESSION: 1. Cardiomegaly with interstitial edema and bilateral pleural effusions compatible with congestive heart failure. 2. Bibasilar airspace disease likely reflects atelectasis. Electronically Signed   By: San Morelle M.D.   On: 11/09/2019 10:33   ECHOCARDIOGRAM COMPLETE  Result Date: 11/10/2019    ECHOCARDIOGRAM REPORT   Patient Name:   Henry [name] [facility] Date of Exam: 11/10/2019 Medical Rec #:  [id number]        Height:       69.0 in Accession #:    [id number]       Weight:       136.3 lb Date of Birth:  06-19-1924         BSA:          1.755 m Patient Age:    32 years         BP:           146/62 mmHg Patient Gender: M                HR:           86 bpm. Exam Location:  Inpatient Procedure: 2D Echo, Cardiac Doppler and Color Doppler Indications:    Elevated Troponin.  History:        Patient has prior history of  Echocardiogram examinations, most  recent 06/18/2019. Previous Myocardial Infarction and CAD,                 Carotid Disease; Risk Factors:Hypertension, Diabetes and                 Dyslipidemia.  Sonographer:    Tiffany Dance Referring Phys: [id number] RONDELL A SMITH IMPRESSIONS  1. Left ventricular ejection fraction, by estimation, is 35 to 40%. The left ventricle has moderately decreased function. The left ventricle demonstrates global hypokinesis. Left ventricular diastolic function could not be evaluated.  2. Right ventricular systolic function is mildly reduced. The right ventricular size is normal. There is normal pulmonary artery systolic pressure.  3. Left atrial size was mildly dilated.  4. The mitral valve is normal in structure. Mild mitral valve regurgitation. No evidence of mitral stenosis.  5. The aortic valve is tricuspid. There is severe calcifcation of the aortic valve. There is moderate thickening of the aortic valve. Aortic valve regurgitation is moderate. Moderate aortic valve stenosis. Aortic valve area, by VTI measures 0.92 cm. Aortic valve mean gradient measures 18.0 mmHg. Aortic valve Vmax measures 2.87 m/s. The dimensionless index is low at 0.26 and consistent with more severe AS. SV index is reduced at 31. The AV mean gradient is likely underestimated in the setting of LV dysfunction and this likely represents at least moderate to severe low flow low gradient aortic stenosis.  6. Aortic dilatation noted. There is borderline dilatation of the aortic root, measuring 37 mm. There is dilatation of the ascending aorta, measuring 38 mm.  7. The inferior vena cava is normal in size with greater than 50% respiratory variability, suggesting right atrial pressure of 3 mmHg. FINDINGS  Left Ventricle: Left ventricular ejection fraction, by estimation, is 35 to 40%. The left ventricle has moderately decreased function. The left ventricle demonstrates global hypokinesis. The left  ventricular internal cavity size was normal in size. There is no left ventricular hypertrophy. Left ventricular diastolic function could not be evaluated. Right Ventricle: The right ventricular size is normal. No increase in right ventricular wall thickness. Right ventricular systolic function is mildly reduced. There is normal pulmonary artery systolic pressure. The tricuspid regurgitant velocity is 2.52 m/s, and with an assumed right atrial pressure of 3 mmHg, the estimated right ventricular systolic pressure is 12.8 mmHg. Left Atrium: Left atrial size was mildly dilated. Right Atrium: Right atrial size was normal in size. Pericardium: There is no evidence of pericardial effusion. Mitral Valve: The mitral valve is normal in structure. There is mild thickening of the anterior mitral valve leaflet(s). There is mild calcification of the anterior mitral valve leaflet(s). Mild to moderate mitral annular calcification. Mild mitral valve  regurgitation. No evidence of mitral valve stenosis. Tricuspid Valve: The tricuspid valve is normal in structure. Tricuspid valve regurgitation is trivial. No evidence of tricuspid stenosis. Aortic Valve: The aortic valve is tricuspid. There is severe calcifcation of the aortic valve. There is moderate thickening of the aortic valve. Aortic valve regurgitation is moderate. Aortic regurgitation PHT measures 225 msec. Moderate to severe aortic  stenosis is present. Aortic valve mean gradient measures 18.0 mmHg. Aortic valve peak gradient measures 32.9 mmHg. Aortic valve area, by VTI measures 0.92 cm. Pulmonic Valve: The pulmonic valve was normal in structure. Pulmonic valve regurgitation is not visualized. No evidence of pulmonic stenosis. Aorta: Aortic dilatation noted. There is borderline dilatation of the aortic root, measuring 37 mm. There is dilatation of the ascending aorta, measuring 38 mm. Venous: The inferior vena  cava is normal in size with greater than 50% respiratory  variability, suggesting right atrial pressure of 3 mmHg. IAS/Shunts: No atrial level shunt detected by color flow Doppler.  LEFT VENTRICLE PLAX 2D LVIDd:         5.10 cm LVIDs:         4.40 cm LV PW:         1.20 cm LV IVS:        0.90 cm LVOT diam:     2.10 cm LV SV:         55 LV SV Index:   31 LVOT Area:     3.46 cm  RIGHT VENTRICLE RV Basal diam:  2.70 cm RV Mid diam:    1.90 cm RV S prime:     8.78 cm/s TAPSE (M-mode): 1.4 cm LEFT ATRIUM             Index       RIGHT ATRIUM           Index LA diam:        4.80 cm 2.73 cm/m  RA Area:     14.70 cm LA Vol (A2C):   73.7 ml 41.99 ml/m RA Volume:   31.50 ml  17.95 ml/m LA Vol (A4C):   71.5 ml 40.73 ml/m LA Biplane Vol: 73.8 ml 42.04 ml/m  AORTIC VALVE AV Area (Vmax):    0.91 cm AV Area (Vmean):   0.95 cm AV Area (VTI):     0.92 cm AV Vmax:           287.00 cm/s AV Vmean:          196.333 cm/s AV VTI:            0.602 m AV Peak Grad:      32.9 mmHg AV Mean Grad:      18.0 mmHg LVOT Vmax:         75.20 cm/s LVOT Vmean:        53.700 cm/s LVOT VTI:          0.159 m LVOT/AV VTI ratio: 0.26 AI PHT:            225 msec  AORTA Ao Root diam: 3.70 cm Ao Asc diam:  3.80 cm MITRAL VALVE                TRICUSPID VALVE MV Area (PHT): 5.54 cm     TR Peak grad:   25.4 mmHg MV Decel Time: 137 msec     TR Vmax:        252.00 cm/s MV E velocity: 132.00 cm/s MV A velocity: 124.00 cm/s  SHUNTS MV E/A ratio:  1.06         Systemic VTI:  0.16 m                             Systemic Diam: 2.10 cm Fransico Him MD Electronically signed by Fransico Him MD Signature Date/Time: 11/10/2019/3:04:25 PM    Final     Microbiology: Recent Results (from the past 240 hour(s))  SARS Coronavirus 2 by RT PCR (hospital order, performed in [place] hospital lab) Nasopharyngeal Nasopharyngeal Swab     Status: None   Collection Time: 11/09/19 10:03 AM   Specimen: Nasopharyngeal Swab  Result Value Ref Range Status   SARS Coronavirus 2 NEGATIVE NEGATIVE Final    Comment: (NOTE) SARS-CoV-2  target nucleic acids are NOT DETECTED.  The  SARS-CoV-2 RNA is generally detectable in upper and lower respiratory specimens during the acute phase of infection. The lowest concentration of SARS-CoV-2 viral copies this assay can detect is 250 copies / mL. A negative result does not preclude SARS-CoV-2 infection and should not be used as the sole basis for treatment or other patient management decisions.  A negative result may occur with improper specimen collection / handling, submission of specimen other than nasopharyngeal swab, presence of viral mutation(s) within the areas targeted by this assay, and inadequate number of viral copies (<250 copies / mL). A negative result must be combined with clinical observations, patient history, and epidemiological information.  Fact Sheet for Patients:   StrictlyIdeas.no  Fact Sheet for Healthcare Providers: BankingDealers.co.za  This test is not yet approved or  cleared by the [place] FDA and has been authorized for detection and/or diagnosis of SARS-CoV-2 by FDA under an Emergency Use Authorization (EUA).  This EUA will remain in effect (meaning this test can be used) for the duration of the COVID-19 declaration under Section 564(b)(1) of the Act, 21 U.S.C. section 360bbb-3(b)(1), unless the authorization is terminated or revoked sooner.  Performed at [facility], [place] [address] [place], [place] [address]      Labs: Basic Metabolic Panel: Recent Labs  Lab 11/09/19 1006 11/10/19 0507 11/11/19 0425 11/12/19 0610 11/13/19 0434  NA 135 134* 134* 132* 132*  K 4.1 3.8 3.7 3.7 3.5  CL 103 104 104 102 101  CO2 18* 18* 18* 20* 20*  GLUCOSE 132* 117* 108* 105* 103*  BUN 51* 55* 60* 62* 67*  CREATININE 3.35* 3.39* 3.47* 3.74* 3.74*  CALCIUM 9.3 9.4 9.3 9.1 8.7*  MG 2.1  --   --   --   --    Liver Function Tests: Recent Labs  Lab 11/09/19 1006  AST 21  ALT 16  ALKPHOS  79  BILITOT 0.9  PROT 7.0  ALBUMIN 3.6   Recent Labs  Lab 11/09/19 1006  LIPASE 24   No results for input(s): AMMONIA in the last 168 hours. CBC: Recent Labs  Lab 11/09/19 1006 11/10/19 0507 11/12/19 0610 11/13/19 0434  WBC 9.9 7.6 7.5 7.0  NEUTROABS 7.6  --   --   --   HGB 8.4* 8.3* 7.6* 7.6*  HCT 26.0* 25.3* 22.8* 22.8*  MCV 94.5 92.3 89.1 89.4  PLT 247 237 222 235   Cardiac Enzymes: No results for input(s): CKTOTAL, CKMB, CKMBINDEX, TROPONINI in the last 168 hours. BNP: BNP (last 3 results) Recent Labs    11/09/19 1006  BNP 979.3*        Signed:  Oswald Hillock MD.  [facility] 11/13/2019, 12:37 PM

## 2019-11-13 NOTE — Progress Notes (Signed)
Progress Note  Patient Name: Henry [name] Date of Encounter: 11/13/2019  [facility] Cardiologist: Sherren Mocha, MD   Subjective   Very little dizziness. Dyspnea improved. Creat stable at 3.7.  Inpatient Medications    Scheduled Meds: . amLODipine  10 mg Oral QPM  . aspirin EC  81 mg Oral Daily  . atorvastatin  40 mg Oral QPM  . calcitRIOL  0.25 mcg Oral Weekly  . calcium acetate  667 mg Oral BID WC  . [START ON 11/14/2019] furosemide  40 mg Oral QODAY  . influenza vaccine adjuvanted  0.5 mL Intramuscular Tomorrow-1000  . levothyroxine  50 mcg Oral QAC breakfast  . multivitamin  1 tablet Oral QHS  . sodium chloride flush  3 mL Intravenous Q12H   Continuous Infusions: . sodium chloride     PRN Meds: sodium chloride, acetaminophen, benzonatate, nitroGLYCERIN, ondansetron (ZOFRAN) IV, sodium chloride flush   Vital Signs    Vitals:   11/12/19 0645 11/12/19 1553 11/12/19 2050 11/13/19 0500  BP: 140/60 132/66 (!) 127/58 130/62  Pulse: 77 78    Resp: 15 (!) 22 20 18   Temp: 98.6 F (37 C) 98.1 F (36.7 C) 98.2 F (36.8 C) 98.9 F (37.2 C)  TempSrc: Oral Oral Oral Oral  SpO2: 98% 97% 97% 98%  Weight: 61.6 kg   62 kg  Height:        Intake/Output Summary (Last 24 hours) at 11/13/2019 0937 Last data filed at 11/13/2019 0500 Gross per 24 hour  Intake 960 ml  Output 1150 ml  Net -190 ml   Last 3 Weights 11/13/2019 11/12/2019 11/11/2019  Weight (lbs) 136 lb 11 oz 135 lb 14.4 oz 135 lb 1.6 oz  Weight (kg) 62 kg 61.644 kg 61.281 kg      Telemetry    NSR, nocturnal 2nd degree AV block, Mobitz type 1 with mild bradycardia - Personally Reviewed  ECG    No new tracing - Personally Reviewed  Physical Exam  Appears well GEN: No acute distress.   Neck: No JVD Cardiac: RRR, muffled S2, 2/6 very late peaking AS murmur, no diastolic murmurs, rubs, or gallops.  Respiratory: Clear to auscultation bilaterally. GI: Soft, nontender, non-distended  MS: No edema; No  deformity. Neuro:  Nonfocal  Psych: Normal affect   Labs    High Sensitivity Troponin:   Recent Labs  Lab 11/09/19 1006 11/09/19 1224 11/09/19 1531 11/09/19 1913  TROPONINIHS 439* 489* 613* 724*      Chemistry Recent Labs  Lab 11/09/19 1006 11/10/19 0507 11/11/19 0425 11/12/19 0610 11/13/19 0434  NA 135   < > 134* 132* 132*  K 4.1   < > 3.7 3.7 3.5  CL 103   < > 104 102 101  CO2 18*   < > 18* 20* 20*  GLUCOSE 132*   < > 108* 105* 103*  BUN 51*   < > 60* 62* 67*  CREATININE 3.35*   < > 3.47* 3.74* 3.74*  CALCIUM 9.3   < > 9.3 9.1 8.7*  PROT 7.0  --   --   --   --   ALBUMIN 3.6  --   --   --   --   AST 21  --   --   --   --   ALT 16  --   --   --   --   ALKPHOS 79  --   --   --   --   BILITOT 0.9  --   --   --   --  GFRNONAA 15*   < > 14* 13* 13*  GFRAA 17*   < > 17* 15* 15*  ANIONGAP 14   < > 12 10 11    < > = values in this interval not displayed.     Hematology Recent Labs  Lab 11/10/19 0507 11/10/19 0507 11/12/19 0610 11/12/19 1223 11/13/19 0434  WBC 7.6  --  7.5  --  7.0  RBC 2.74*   < > 2.56* 2.54* 2.55*  HGB 8.3*  --  7.6*  --  7.6*  HCT 25.3*  --  22.8*  --  22.8*  MCV 92.3  --  89.1  --  89.4  MCH 30.3  --  29.7  --  29.8  MCHC 32.8  --  33.3  --  33.3  RDW 14.5  --  13.9  --  13.6  PLT 237  --  222  --  235   < > = values in this interval not displayed.    BNP Recent Labs  Lab 11/09/19 1006  BNP 979.3*     DDimer  Recent Labs  Lab 11/09/19 1006  DDIMER 1.32*     Radiology    No results found.  Cardiac Studies   Echo 11/10/2019 1. Left ventricular ejection fraction, by estimation, is 35 to 40%. The  left ventricle has moderately decreased function. The left ventricle  demonstrates global hypokinesis. Left ventricular diastolic function could  not be evaluated.  2. Right ventricular systolic function is mildly reduced. The right  ventricular size is normal. There is normal pulmonary artery systolic  pressure.  3. Left  atrial size was mildly dilated.  4. The mitral valve is normal in structure. Mild mitral valve  regurgitation. No evidence of mitral stenosis.  5. The aortic valve is tricuspid. There is severe calcifcation of the  aortic valve. There is moderate thickening of the aortic valve. Aortic  valve regurgitation is moderate. Moderate aortic valve stenosis. Aortic  valve area, by VTI measures 0.92 cm.  Aortic valve mean gradient measures 18.0 mmHg. Aortic valve Vmax measures  2.87 m/s. The dimensionless index is low at 0.26 and consistent with more  severe AS. SV index is reduced at 31. The AV mean gradient is likely  underestimated in the setting of LV  dysfunction and this likely represents at least moderate to severe low  flow low gradient aortic stenosis.  6. Aortic dilatation noted. There is borderline dilatation of the aortic  root, measuring 37 mm. There is dilatation of the ascending aorta,  measuring 38 mm.  7. The inferior vena cava is normal in size with greater than 50%  respiratory variability, suggesting right atrial pressure of 3 mmHg.   Patient Profile     86 y.o. male with CAD s/p CABG 1062, chronic diastolic CHF, previously moderate AS, HTN, HLD, PAD, DM II and CKD stage IV presented with acute on chronic diastolic CHF likely due to progression of his aortic stenosis.   Assessment & Plan    Probably as close to euvolemia and stable hemodynamics as we can get him. Early f/u w Dr. Burt Knack.  [facility] will sign off.   Medication Recommendations:  Furosemide 40 mg every other day + as needed for weight over 137 lb. Hydralazine stopped. Other recommendations (labs, testing, etc):  Try to keep home weight 135-137 lb. Na restriction and daily weights. Follow up as an outpatient:  TOC 1-2 weeks  For questions or updates, please contact [place] Please consult [email or web address]  for contact info under        Signed, Sanda Klein, MD  11/13/2019, 9:37 AM

## 2019-11-13 NOTE — Telephone Encounter (Signed)
Would Dr. Jenny Reichmann be the attending for this patient  [facility] [phone]  Patient just discharged

## 2019-11-13 NOTE — Care Management Important Message (Signed)
Important Message  Patient Details  Name: Henry [name] MRN: [id number] Date of Birth: [birth year]   Medicare Important Message Given:  Yes     Shelda Altes 11/13/2019, 9:26 AM

## 2019-11-15 ENCOUNTER — Telehealth: Payer: Self-pay | Admitting: Internal Medicine

## 2019-11-15 NOTE — Telephone Encounter (Signed)
Please refill as per office routine med refill policy (all routine meds refilled for 3 mo or monthly per pt preference up to one year from last visit, then month to month grace period for 3 mo, then further med refills will have to be denied)  

## 2019-11-16 ENCOUNTER — Other Ambulatory Visit: Payer: Self-pay | Admitting: *Deleted

## 2019-11-16 ENCOUNTER — Telehealth: Payer: Self-pay | Admitting: Cardiovascular Disease

## 2019-11-16 NOTE — Telephone Encounter (Signed)
  Follow up message  [facility] from [facility] calling, advised Dr Jenny Reichmann not in the office

## 2019-11-16 NOTE — Telephone Encounter (Signed)
Pt was seen got out of ER and needs an appt. Did not see appt within time frame written. Please call to discuss with pt.

## 2019-11-16 NOTE — Patient Outreach (Signed)
[facility]) Care Management  11/16/2019  Henry [name] [facility] 1925-01-21 [id number]   EMMI-GENERAL DISCHARGE-RESOLVED RED ON EMMI ALERT Day #1 Date:11/15/2019 Red Alert Reason:HAS NOT READ THE D/C PAPERS AND NO F/U APPT  OUTREACH#1 RN spoke with pt concerning the above emmi. Pt states she has read his discharge papers and aware of all appointments. RN review the pending appointments and verified all information was in his discharge packet for review. No other issues or needs at this time.  Plan: Will close this emmi as resolved with no additional needs.  Raina Mina, RN Care Management Coordinator [place] Office [phone]

## 2019-11-16 NOTE — Telephone Encounter (Signed)
[place] for me to be attending  [employer] for hospice mD to assist with symptomatic meds

## 2019-11-16 NOTE — Telephone Encounter (Signed)
   Crystal from [employer] response as soon as possible. Patient recently discharged

## 2019-11-16 NOTE — Telephone Encounter (Signed)
Sent to Dr. Jenny Reichmann to advise as a High priority message.

## 2019-11-17 NOTE — Telephone Encounter (Signed)
LDVM for Crystal of Dr. Gwynn Burly agreement and verbal OK to be attending MD.

## 2019-11-18 ENCOUNTER — Other Ambulatory Visit: Payer: Self-pay

## 2019-11-18 ENCOUNTER — Telehealth: Payer: Self-pay

## 2019-11-18 ENCOUNTER — Telehealth: Payer: Self-pay | Admitting: Internal Medicine

## 2019-11-18 DIAGNOSIS — E785 Hyperlipidemia, unspecified: Secondary | ICD-10-CM | POA: Diagnosis not present

## 2019-11-18 DIAGNOSIS — N184 Chronic kidney disease, stage 4 (severe): Secondary | ICD-10-CM | POA: Diagnosis not present

## 2019-11-18 DIAGNOSIS — E1122 Type 2 diabetes mellitus with diabetic chronic kidney disease: Secondary | ICD-10-CM | POA: Diagnosis not present

## 2019-11-18 DIAGNOSIS — Z9981 Dependence on supplemental oxygen: Secondary | ICD-10-CM | POA: Diagnosis not present

## 2019-11-18 DIAGNOSIS — Z7982 Long term (current) use of aspirin: Secondary | ICD-10-CM | POA: Diagnosis not present

## 2019-11-18 DIAGNOSIS — D631 Anemia in chronic kidney disease: Secondary | ICD-10-CM | POA: Diagnosis not present

## 2019-11-18 DIAGNOSIS — E039 Hypothyroidism, unspecified: Secondary | ICD-10-CM | POA: Diagnosis not present

## 2019-11-18 DIAGNOSIS — I5033 Acute on chronic diastolic (congestive) heart failure: Secondary | ICD-10-CM | POA: Diagnosis not present

## 2019-11-18 DIAGNOSIS — Z9181 History of falling: Secondary | ICD-10-CM | POA: Diagnosis not present

## 2019-11-18 DIAGNOSIS — I083 Combined rheumatic disorders of mitral, aortic and tricuspid valves: Secondary | ICD-10-CM | POA: Diagnosis not present

## 2019-11-18 DIAGNOSIS — I10 Essential (primary) hypertension: Secondary | ICD-10-CM | POA: Diagnosis not present

## 2019-11-18 DIAGNOSIS — I130 Hypertensive heart and chronic kidney disease with heart failure and stage 1 through stage 4 chronic kidney disease, or unspecified chronic kidney disease: Secondary | ICD-10-CM | POA: Diagnosis not present

## 2019-11-18 DIAGNOSIS — I252 Old myocardial infarction: Secondary | ICD-10-CM | POA: Diagnosis not present

## 2019-11-18 DIAGNOSIS — I2510 Atherosclerotic heart disease of native coronary artery without angina pectoris: Secondary | ICD-10-CM | POA: Diagnosis not present

## 2019-11-18 DIAGNOSIS — Z951 Presence of aortocoronary bypass graft: Secondary | ICD-10-CM | POA: Diagnosis not present

## 2019-11-18 DIAGNOSIS — N179 Acute kidney failure, unspecified: Secondary | ICD-10-CM | POA: Diagnosis not present

## 2019-11-18 DIAGNOSIS — I0981 Rheumatic heart failure: Secondary | ICD-10-CM | POA: Diagnosis not present

## 2019-11-18 MED ORDER — ATORVASTATIN CALCIUM 40 MG PO TABS
40.0000 mg | ORAL_TABLET | Freq: Every evening | ORAL | 0 refills | Status: DC
Start: 2019-11-18 — End: 2019-12-03

## 2019-11-18 NOTE — Telephone Encounter (Signed)
[name] with [name] called and is requesting verbal orders for nursing.   She can be reached at [phone]

## 2019-11-18 NOTE — Telephone Encounter (Signed)
    Patient's daughter calling to request refill for atorvastatin (LIPITOR) 40 MG tablet

## 2019-11-18 NOTE — Telephone Encounter (Signed)
Scheduled the patient 10/5 with Nicki Reaper for post hospital visit.

## 2019-11-18 NOTE — Telephone Encounter (Signed)
Telephone call to patient to schedule palliative care visit with patient. Patient/family in agreement with home visit on 9-23/21 at 12:30PM.

## 2019-11-18 NOTE — Telephone Encounter (Signed)
Sent to Dr. Jenny Reichmann

## 2019-11-19 ENCOUNTER — Other Ambulatory Visit: Payer: Medicare Other | Admitting: *Deleted

## 2019-11-19 ENCOUNTER — Telehealth: Payer: Self-pay | Admitting: Internal Medicine

## 2019-11-19 ENCOUNTER — Other Ambulatory Visit: Payer: Medicare Other

## 2019-11-19 ENCOUNTER — Other Ambulatory Visit: Payer: Self-pay

## 2019-11-19 VITALS — BP 131/64 | HR 74 | Temp 97.8°F | Resp 20

## 2019-11-19 DIAGNOSIS — Z515 Encounter for palliative care: Secondary | ICD-10-CM

## 2019-11-19 NOTE — Telephone Encounter (Signed)
Also, patient sent home with oxygen as needed Order 2L PRN  Should patient be monitoring his blood sugars also?  Please advise.

## 2019-11-19 NOTE — Telephone Encounter (Signed)
    Don from [place] requesting verbal order for OT 2X week 1 1x week 1 1x week 2  Please call Timmothy Sours @[phone], ok to leave a message

## 2019-11-19 NOTE — Telephone Encounter (Signed)
Ok for verbals 

## 2019-11-20 DIAGNOSIS — D631 Anemia in chronic kidney disease: Secondary | ICD-10-CM | POA: Diagnosis not present

## 2019-11-20 DIAGNOSIS — N184 Chronic kidney disease, stage 4 (severe): Secondary | ICD-10-CM | POA: Diagnosis not present

## 2019-11-20 DIAGNOSIS — I5033 Acute on chronic diastolic (congestive) heart failure: Secondary | ICD-10-CM | POA: Diagnosis not present

## 2019-11-20 DIAGNOSIS — I0981 Rheumatic heart failure: Secondary | ICD-10-CM | POA: Diagnosis not present

## 2019-11-20 DIAGNOSIS — E1122 Type 2 diabetes mellitus with diabetic chronic kidney disease: Secondary | ICD-10-CM | POA: Diagnosis not present

## 2019-11-20 DIAGNOSIS — I130 Hypertensive heart and chronic kidney disease with heart failure and stage 1 through stage 4 chronic kidney disease, or unspecified chronic kidney disease: Secondary | ICD-10-CM | POA: Diagnosis not present

## 2019-11-20 NOTE — Progress Notes (Signed)
COMMUNITY PALLIATIVE CARE SW NOTE  PATIENT NAME: Henry [name] DOB: 04-27-1924 MRN: [id number]  PRIMARY CARE PROVIDER: [name], MD  RESPONSIBLE PARTY:  Acct ID - Guarantor Home Phone Work Phone Relationship Acct Type  [id number] [name], [name] [id number]  Self P/F     [address], [place], [place] [address]     PLAN OF CARE and INTERVENTIONS:             1. GOALS OF CARE/ ADVANCE CARE PLANNING:  Henry [name] is for patient to remain in his home, as independent as possible. Patient is a DNR, form in home.  2. SOCIAL/EMOTIONAL/SPIRITUAL ASSESSMENT/ INTERVENTIONS:  SW and RN-Henry [name] completed an initial palliative care visit with patient at his home where he was present with his daughters-Henry [name], Henry [name] and Henry [name] (Telephonic). Patient reported that today was the best he has felt since he came home from the hospital. He was hospitalized due to heart issues. Since his hospitalizations patient is now using a walker. He is on 2L of oxygen as needed, but has needed since returning home. He does experience shortness of breath that requires him to take frequent rest breaks. His overall energy is low. He is currently a 134 lbs. He is eating at least 2 small meals a day and is not open to any supplements. Patient is optimistic that he will continue to gain strength and improve his appetite. Patient will be receiving PT/OT 2x week through [facility]. He will have assistance with personal care needs through a [facility] aide for 3 to 4 weeks. His daughters will be taking turns caring for patient and assist with household tasks and managing his medications. Patient's wife is currently a patient at [facility]. However due to this team's prior involvement with wife, he was very open and receptive to palliative care services for himself. He provided verbal consent to the services. Patient was born in [place]. He served in [employer] during [place]. He is married and father of five children. Patient  worked in [employer] and is [place] by [employer]. SW provided supportive presence, active listening, reassurance of support while assessing his needs and comfort. SW will provide ongoing psychosocial assessment of needs, comfort and coping of patient and provide support as needed.  3. PATIENT/CAREGIVER EDUCATION/ COPING:  Patient is alert and oriented x3. He appears to be confident and happy that his daughters are present with him and helping him manage at home, and also visiting with his wife. He is relieved that his wife is getting good care at [facility] and is confident in her comfort. The family appears to be coping well.  4. PERSONAL EMERGENCY PLAN:  911 can be activated for emergencies.  5. COMMUNITY RESOURCES COORDINATION/ HEALTH CARE NAVIGATION:  Patient is receiving physical therapy, occupational therapy and nurse aide services for personal care services. Patient's wife is currently at [facility]/hospice.  6. FINANCIAL/LEGAL CONCERNS/INTERVENTIONS:  None.     SOCIAL HX:  Social History   Tobacco Use  . Smoking status: Former Smoker    Packs/day: 1.00    Years: 10.00    Pack years: 10.00    Types: Cigarettes    Quit date: 02/26/1953    Years since quitting: 66.7  . Smokeless tobacco: Never Used  Substance Use Topics  . Alcohol use: Not Currently    Alcohol/week: 0.0 standard drinks    CODE STATUS: DNR ADVANCED DIRECTIVES: No MOST FORM COMPLETE:  No HOSPICE EDUCATION PROVIDED: No  PPS: Patient is alert and  oriented x3. He ambulates with a walker. He requires assistance with showers.   Duration of visit and documentation: 60 minutes      Katheren Puller, LCSW

## 2019-11-20 NOTE — Telephone Encounter (Signed)
Sent to Dr. Tyee. 

## 2019-11-23 ENCOUNTER — Telehealth: Payer: Self-pay | Admitting: Internal Medicine

## 2019-11-23 DIAGNOSIS — N184 Chronic kidney disease, stage 4 (severe): Secondary | ICD-10-CM | POA: Diagnosis not present

## 2019-11-23 DIAGNOSIS — I0981 Rheumatic heart failure: Secondary | ICD-10-CM | POA: Diagnosis not present

## 2019-11-23 DIAGNOSIS — D631 Anemia in chronic kidney disease: Secondary | ICD-10-CM | POA: Diagnosis not present

## 2019-11-23 DIAGNOSIS — I130 Hypertensive heart and chronic kidney disease with heart failure and stage 1 through stage 4 chronic kidney disease, or unspecified chronic kidney disease: Secondary | ICD-10-CM | POA: Diagnosis not present

## 2019-11-23 DIAGNOSIS — I5033 Acute on chronic diastolic (congestive) heart failure: Secondary | ICD-10-CM | POA: Diagnosis not present

## 2019-11-23 DIAGNOSIS — E1122 Type 2 diabetes mellitus with diabetic chronic kidney disease: Secondary | ICD-10-CM | POA: Diagnosis not present

## 2019-11-23 NOTE — Telephone Encounter (Signed)
Ok for verbals 

## 2019-11-23 NOTE — Telephone Encounter (Signed)
Henry [name] with [name] called and is requesting verbal orders for physical therapy  for 2w4 and 1w5.    Please call Henry [name] back at [phone]

## 2019-11-23 NOTE — Telephone Encounter (Signed)
Ok for verbals  We can hold on checking CBGs for now

## 2019-11-23 NOTE — Telephone Encounter (Signed)
Sent to Dr. Kishaun. 

## 2019-11-24 NOTE — Telephone Encounter (Signed)
[name] with [facility] called and was wanting call back in regards to O2 2L PRN. Also a veral ok in a delay in care.

## 2019-11-25 DIAGNOSIS — I0981 Rheumatic heart failure: Secondary | ICD-10-CM | POA: Diagnosis not present

## 2019-11-25 DIAGNOSIS — I130 Hypertensive heart and chronic kidney disease with heart failure and stage 1 through stage 4 chronic kidney disease, or unspecified chronic kidney disease: Secondary | ICD-10-CM | POA: Diagnosis not present

## 2019-11-25 DIAGNOSIS — D631 Anemia in chronic kidney disease: Secondary | ICD-10-CM | POA: Diagnosis not present

## 2019-11-25 DIAGNOSIS — N184 Chronic kidney disease, stage 4 (severe): Secondary | ICD-10-CM | POA: Diagnosis not present

## 2019-11-25 DIAGNOSIS — E1122 Type 2 diabetes mellitus with diabetic chronic kidney disease: Secondary | ICD-10-CM | POA: Diagnosis not present

## 2019-11-25 DIAGNOSIS — I5033 Acute on chronic diastolic (congestive) heart failure: Secondary | ICD-10-CM | POA: Diagnosis not present

## 2019-11-25 NOTE — Telephone Encounter (Signed)
Not sure a delay in getting the patient his o2 2L  prn is a good idea - is this what they are wanting approval for?

## 2019-11-25 NOTE — Telephone Encounter (Signed)
Don from [place] called and was wanting an update on verbal orders for OT 2week1 0week1 1week1 Henry [name]   Please call Timmothy Sours at [phone]

## 2019-11-25 NOTE — Telephone Encounter (Signed)
Ok for verbals 

## 2019-11-25 NOTE — Telephone Encounter (Signed)
Sent to Dr. Dontaye. 

## 2019-11-25 NOTE — Telephone Encounter (Signed)
Sent to dr. Tynell. 

## 2019-11-26 DIAGNOSIS — N184 Chronic kidney disease, stage 4 (severe): Secondary | ICD-10-CM | POA: Diagnosis not present

## 2019-11-26 DIAGNOSIS — I0981 Rheumatic heart failure: Secondary | ICD-10-CM | POA: Diagnosis not present

## 2019-11-26 DIAGNOSIS — E1122 Type 2 diabetes mellitus with diabetic chronic kidney disease: Secondary | ICD-10-CM | POA: Diagnosis not present

## 2019-11-26 DIAGNOSIS — I130 Hypertensive heart and chronic kidney disease with heart failure and stage 1 through stage 4 chronic kidney disease, or unspecified chronic kidney disease: Secondary | ICD-10-CM | POA: Diagnosis not present

## 2019-11-26 DIAGNOSIS — I5033 Acute on chronic diastolic (congestive) heart failure: Secondary | ICD-10-CM | POA: Diagnosis not present

## 2019-11-26 DIAGNOSIS — D631 Anemia in chronic kidney disease: Secondary | ICD-10-CM | POA: Diagnosis not present

## 2019-11-26 NOTE — Telephone Encounter (Signed)
Oh ok, ok for delay in PT for now, thanks

## 2019-11-26 NOTE — Telephone Encounter (Signed)
Spoke with [name] to give the OK for @L  of O2 PRN.  **I was able to [facility] of verbal OK for orders OT 2week1 [facility] Per Dr. Jenny Reichmann.

## 2019-11-27 ENCOUNTER — Telehealth: Payer: Self-pay | Admitting: Cardiovascular Disease

## 2019-11-27 NOTE — Telephone Encounter (Signed)
Olin was visiting the patient and wanted Dr. Burt Knack and his team to know the patient had abnormal breath sounds in his Lower Left Lobe. She reports there are no other clinical changes (stable weight, no increased SOB).

## 2019-11-27 NOTE — Telephone Encounter (Signed)
Left message for [facility] nurse that the patient has an appointment at [facility] Tuesday. Instructed her to call our office prior to that time if the patient experiences symptoms and the on-call will call back.

## 2019-11-27 NOTE — Progress Notes (Signed)
COMMUNITY PALLIATIVE CARE RN NOTE  PATIENT NAME: Henry [name] DOB: 1924-06-17 MRN: [id number]  PRIMARY CARE PROVIDER: [name], MD  RESPONSIBLE PARTY:  Acct ID - Guarantor Home Phone Work Phone Relationship Acct Type  [id number] SANTE, BIEDERMANN* [phone]  Self P/F     [address], [place], [place] [address]   Covid-19 Pre-screening Negative  PLAN OF CARE and INTERVENTION:  1. ADVANCE CARE PLANNING/GOALS OF CARE: Goal is for patient to become stronger and remain as independent as possible.  2. PATIENT/CAREGIVER EDUCATION: Explained Palliative care services, symptom management, safe mobility/transfers 3. DISEASE STATUS: Joint visit made with Palliative care social worker, Katheren Puller. There were 2 daughter's present in the home during visit and the other daughter joined via telephone. He is alert and oriented x 4 and able to engage in appropriate conversation. Patient denies pain at this time.  He speaks of his recent hospitalization. He states that his wife was not feeling well so he took her to the ED. When he returned home, he c/o dizziness and a headache which did not improve and he almost passed out. He was admitted to [facility] hospital from 11/09/19 to 11/13/19. He was found to have severe aortic stenosis but is not a candidate for surgery. He was discharged with home health PT/OT and aide through [facility]. DME delivered includes home oxygen and hospital bed. He says that he is feeling some better today. He says he becomes mildly short of breath with exertion but recovers quickly at rest. He has not felt that he has needed his oxygen while at home. His appetite is slowly improving. He is on a heart health low sodium diet. He is eating 2 meals/day of small portions. He has lost some weight and is currently 134 lbs. Prior to this daughter says patient was in the 140s. He ambulates using a walker. He requires assistance wth his showers. His wife is currently at [facility] for end of life and  he does visit when he can. He is no longer driving at this point. His wife used to be a former Palliative care patient so we are very familiar with Mr. Schaumburg. He is agreeable with future visits from Palliative care. Will continue to monitor.  HISTORY OF PRESENT ILLNESS:  This is a 86 yo patient with a h/o hypertension, hyperlipidemia, CAD s/p CABG, moderate (now severe) aortic stenosis, DM II and CKD Stage IV. Palliative care has been asked to follow patient for additional support. Will visit patient monthly and PRN.   CODE STATUS: DNR   ADVANCED DIRECTIVES: N MOST FORM: no PPS: 50%   PHYSICAL EXAM:   VITALS: Today's Vitals   11/19/19 1246  BP: 131/64  Pulse: 74  Resp: 20  Temp: 97.8 F (36.6 C)  TempSrc: Temporal  SpO2: 99%  PainSc: 0-No pain    LUNGS: clear to auscultation  CARDIAC: Cor RRR EXTREMITIES: No edema SKIN: Exposed skin is dry and intact  NEURO: Alert and oriented x 4, generalized weakness, ambulatory w/walker   (Duration of visit and documentation 75 minutes)   Daryl Eastern, RN BSN

## 2019-11-30 DIAGNOSIS — I0981 Rheumatic heart failure: Secondary | ICD-10-CM | POA: Diagnosis not present

## 2019-11-30 DIAGNOSIS — N184 Chronic kidney disease, stage 4 (severe): Secondary | ICD-10-CM | POA: Diagnosis not present

## 2019-11-30 DIAGNOSIS — D631 Anemia in chronic kidney disease: Secondary | ICD-10-CM | POA: Diagnosis not present

## 2019-11-30 DIAGNOSIS — I5033 Acute on chronic diastolic (congestive) heart failure: Secondary | ICD-10-CM | POA: Diagnosis not present

## 2019-11-30 DIAGNOSIS — I130 Hypertensive heart and chronic kidney disease with heart failure and stage 1 through stage 4 chronic kidney disease, or unspecified chronic kidney disease: Secondary | ICD-10-CM | POA: Diagnosis not present

## 2019-11-30 DIAGNOSIS — E1122 Type 2 diabetes mellitus with diabetic chronic kidney disease: Secondary | ICD-10-CM | POA: Diagnosis not present

## 2019-11-30 NOTE — Progress Notes (Signed)
Cardiology Office Note:    Date:  12/01/2019   ID:  Henry [name], DOB 1924/10/29, MRN [id number]  PCP:  Henry Borg, MD  [facility] Cardiologist:  Henry Mocha, MD   [facility] Electrophysiologist:  None   Referring MD: Henry Borg, MD   Chief Complaint:  Hospitalization Follow-up (CHF)    Patient Profile:    Henry [name] is a 86 y.o. male with:   Coronary artery disease   S/p CABG in 2000  S/p NSTEMI 02/2018 in setting of acute diastolic CHF + WUJ>>8/1 grafts patent (chronic occlusions)>>Med Rx  Aortic stenosis  Mod to severe (Echocardiogram 9/21): mean 18 mmHg, DI 0.26 - low flow low gradient)  Not a candidate for TAVR.  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  During AVF placement in 02/2018 >> beta-blocker DC'd   Orthostatic intolerance    Prior CV studies: Echocardiogram 11/10/19 EF 35-40, global HK, mild reduced RVSF, normal PASP, mild LAE, mild MR, severe Ca2+ of AV, mod AI, mod AS (mean 18 mmHg, Vmax 287 cm/s, DI 0.26 - gradient underestimated in setting of LV dysfn - at least mod to severe low flow low gradient AS), Ao root 37 mm, asc Ao 38 mm  Echocardiogram 06/18/19 Inf/inf-lat HK, EF 50-55, mod LVH, Gr 2 DD, normal RVSF, mod LAE, mild MR, mild AI, mod AS (mean 25 mmHg), RVSP 45 mmHg  Cardiac catheterization 03/27/2018 LAD proximal 100 OM1 ostial 90 RCA ostial 100 (left to right collaterals) SVG-RPDA 100 SVG-OM1 100 SVG-D1 patent SVG-LAD patent 1. Severe native 3 vessel disease with total occlusion of the RCA and LAD, severe stenosis of the left circumflex 2. S/P remote CABG with continued patency of the LIMA-LAD and SVG-diagonal, total occlusion  of the SVG-RCA and SVG-PDA Recommend medical management of CAD. No clear culprit lesion. Both SVG occlusions appear chronic. No targets for PCI.  Echocardiogram 03/26/2018 EF 50-55, prob inf-lat and lat HK, mild to mod MR, mod AS (AVA 0.93; mean 19.5; peak 36.7)  LE Venous US 03/25/2018 Summary: Right: There is no evidence of deep vein thrombosis in the lower extremity. No cystic structure found in the popliteal fossa. Left: There is no evidence of deep vein thrombosis in the lower extremity. No cystic structure found in the popliteal fossa.  ABI/Arterial US 11/15/17 Final Interpretation: Right: Resting right ankle-brachial index indicates mild right lower extremity arterial disease. The right toe-brachial index is abnormal. Left: Resting left ankle-brachial index indicates moderate left lower extremity arterial disease. The left toe-brachial index is abnormal. *See table(s) above for measurements and observations. Suggest follow up study in 12 months.  Aorta US 11/15/17 Final Interpretation: Abdominal Aorta: Aortoiliac atherosclerosis without focal stenosis. No evidence of an abdominal aortic aneurysm was visualized. The largest aortic measurement is 2.6 cm.  Echo 10/30/17 EF 60-65, mod AS (mean 22, peak 39), mild MR   History of Present Illness:    Mr. Henry [name] was last seen by Dr. Burt [name] in 4/21.    He was admitted 9/13-9/17 with acute, decompensated congestive heart failure in the setting of progressive aortic stenosis, chronic kidney disease, ischemic heart disease and worsening anemia.  His echocardiogram demonstrated worsening LV function with  EF now 35-40%.  He was followed by Cardiology.  Dr. Burt [name] noted that he likely now has stage D1 severe aortic stenosis.  It was felt that he is at high risk of AKI with progression to ESRD with TAVR.  Also, it was felt that his risk of severe morbidity and mortality with progression to ESRD outweighed the benefit of treating aortic  stenosis.  Therefore, palliative care was recommended.     He returns for follow up.  He is here with his daughter.  He is feeling much better.  He has not had chest pain, orthopnea, leg swelling.  His weights have been stable.  He is much less short of breath.  He has not seen Nephrology yet.  He is seeing Palliative Medicine.       Past Medical History:  Diagnosis Date   Anemia    CAD of autologous bypass graft    Carotid artery disease (HCC)    Diabetes mellitus without complication (HCC)    DJD (degenerative joint disease)    History of anemia of chronic disease    Hypercholesteremia    Hypertension    NSTEMI (non-ST elevated myocardial infarction) ([place])    PVD (peripheral vascular disease) with claudication (HCC)    Renal insufficiency    Wears dentures    Wears glasses     Current Medications: Current Meds  Medication Sig   amLODipine (NORVASC) 10 MG tablet TAKE 1 TABLET BY MOUTH ONCE DAILY IN THE EVENING   aspirin EC 81 MG tablet Take 81 mg by mouth daily.    atorvastatin (LIPITOR) 40 MG tablet Take 1 tablet (40 mg total) by mouth every evening.   b complex-vitamin c-folic acid (NEPHRO-VITE) 0.8 MG TABS tablet Take 1 tablet by mouth at bedtime.   calcitRIOL (ROCALTROL) 0.25 MCG capsule Take 0.25 mcg by mouth once a week. Monday   calcium acetate (PHOSLO) 667 MG capsule Take 667 mg by mouth 2 (two) times daily with a meal.   Darbepoetin Alfa (ARANESP) 100 MCG/0.5ML SOSY injection Inject 100 mcg into the skin every 30 (thirty) days.    ferrous gluconate (FERGON) 324 MG tablet Take 324 mg by mouth daily.    furosemide (LASIX) 40 MG tablet Take 1 tablet (40 mg total) by mouth every other day. Also take additional dose for weight greater than 137 lb   Lancets (ACCU-CHEK MULTICLIX) lancets USE TWICE DAILY.   levothyroxine (SYNTHROID) 50 MCG tablet Take 50 mcg by mouth daily before breakfast.   Multiple Vitamins-Minerals (PRESERVISION AREDS 2 PO) Take 1  tablet by mouth daily.     Allergies:   Patient has no known allergies.   Social History   Tobacco Use   Smoking status: Former Smoker    Packs/day: 1.00    Years: 10.00    Pack years: 10.00    Types: Cigarettes    Quit date: 02/26/1953    Years since quitting: 66.8   Smokeless tobacco: Never Used  Vaping Use   Vaping Use: Never used  Substance Use Topics   Alcohol use: Not Currently    Alcohol/week: 0.0 standard drinks   Drug use: No     Family Hx: The patient's family history includes Heart disease in his father and mother.  Review of Systems  Gastrointestinal: Negative for hematochezia and melena.     EKGs/Labs/Other Test Reviewed:    EKG:  EKG is   ordered today.  The ekg ordered today demonstrates normal sinus rhythm, heart rate 70, long first-degree  AV block (PR 448 ms), normal axis, nonspecific ST-T wave changes, QTC 429  Recent Labs: 11/09/2019: ALT 16; B Natriuretic Peptide 979.3; Magnesium 2.1; TSH 4.697 11/13/2019: BUN 67; Creatinine, Ser 3.74; Hemoglobin 7.6; Platelets 235; Potassium 3.5; Sodium 132   Recent Lipid Panel Lab Results  Component Value Date/Time   CHOL 158 08/13/2019 12:03 PM   CHOL 122 07/09/2018 03:48 PM   TRIG 113.0 08/13/2019 12:03 PM   HDL 53.60 08/13/2019 12:03 PM   HDL 47 07/09/2018 03:48 PM   CHOLHDL 3 08/13/2019 12:03 PM   LDLCALC 82 08/13/2019 12:03 PM   LDLCALC 58 07/09/2018 03:48 PM   LDLDIRECT 96.2 04/04/2007 10:49 AM    Physical Exam:    VS:  BP (!) 138/54    Pulse 70    Ht 5\' 9"  (1.753 m)    Wt 138 lb (62.6 kg)    SpO2 94%    BMI 20.38 kg/m     Wt Readings from Last 3 Encounters:  12/01/19 138 lb (62.6 kg)  11/13/19 136 lb 11 oz (62 kg)  08/13/19 148 lb (67.1 kg)     Constitutional:      Appearance: Healthy appearance. Not in distress.  Neck:     Vascular: JVD normal.  Pulmonary:     Effort: Pulmonary effort is normal.     Breath sounds: No wheezing. Bibasilar Rales present.  Cardiovascular:     Normal  rate. Regular rhythm. Normal S1. Normal S2.     Murmurs: There is a grade 3/6 crescendo-decrescendo systolic murmur at the URSB.  Edema:    Peripheral edema absent.  Abdominal:     Palpations: Abdomen is soft. There is no hepatomegaly.  Skin:    General: Skin is warm and dry.  Neurological:     General: No focal deficit present.     Mental Status: Alert and oriented to person, place and time.     Cranial Nerves: Cranial nerves are intact.       ASSESSMENT & PLAN:    1. Severe aortic stenosis Stage D1 severe aortic stenosis.  He is not felt to be a candidate for TAVR as the risks would outweigh the benefit.  Conservative management has been recommended.  He has also been referred to palliative medicine.  2. Chronic combined systolic and diastolic CHF (congestive heart failure) (HCC) EF 35-40.  Nonischemic cardiomyopathy/related to valvular heart disease.  NYHA IIb.  Overall, volume status seems to be stable.  His weights have been stable at home.  He does have some rales on exam but these seem to clear some with cough.  His neck veins are flat today.  I suspect he may be chronically volume overloaded.  Given his tenuous renal function and improved current state, I have recommended continuing his current dose of furosemide.  He knows to take extra furosemide if his weight goes above threshold (137 lbs).  3. Coronary artery disease involving native coronary artery of native heart without angina pectoris History of CABG in 2000.  Status post non-STEMI in 02/2018.  There were no targets for PCI.  2/4 grafts were patent.  He has been managed medically.  He is currently doing well without anginal symptoms.  Continue aspirin, atorvastatin.  He is not on beta-blocker secondary to prior history of second-degree AV block.  4. CKD (chronic kidney disease) stage 5, GFR less than 15 ml/min (HCC) He is followed by Dr. Arty Baumgartner with nephrology.  He no longer has dialysis access and is felt to  be a poor  candidate.  Obtain follow-up BMET today.  5. Essential hypertension Fair control.  Continue to monitor.  If his blood pressure increases further, we could consider adding hydralazine back to his medical regimen.  6. 1st degree AV block History of second-degree type II block in the past.  He was taken off of beta-blocker at that time.  He has not had any recent syncope or near syncope.    Dispo:  Return in about 8 weeks (around 01/26/2020) for Routine Follow Up, w/ Dr. Burt [name], in person.   Medication Adjustments/Labs and Tests Ordered: Current medicines are reviewed at length with the patient today.  Concerns regarding medicines are outlined above.  Tests Ordered: Orders Placed This Encounter  Procedures   Basic metabolic panel   EKG 69-GSPJ   Medication Changes: No orders of the defined types were placed in this encounter.   Signed, [name], PA-C  12/01/2019 12:39 PM    [facility] [place], [place], [place]  [address] Phone: [phone]; Fax: [phone]

## 2019-12-01 ENCOUNTER — Other Ambulatory Visit: Payer: Self-pay

## 2019-12-01 ENCOUNTER — Ambulatory Visit (INDEPENDENT_AMBULATORY_CARE_PROVIDER_SITE_OTHER): Payer: Medicare Other | Admitting: Physician Assistant

## 2019-12-01 ENCOUNTER — Encounter: Payer: Self-pay | Admitting: Physician Assistant

## 2019-12-01 VITALS — BP 138/54 | HR 70 | Ht 69.0 in | Wt 138.0 lb

## 2019-12-01 DIAGNOSIS — I5042 Chronic combined systolic (congestive) and diastolic (congestive) heart failure: Secondary | ICD-10-CM | POA: Diagnosis not present

## 2019-12-01 DIAGNOSIS — I350 Nonrheumatic aortic (valve) stenosis: Secondary | ICD-10-CM

## 2019-12-01 DIAGNOSIS — I440 Atrioventricular block, first degree: Secondary | ICD-10-CM | POA: Diagnosis not present

## 2019-12-01 DIAGNOSIS — I2510 Atherosclerotic heart disease of native coronary artery without angina pectoris: Secondary | ICD-10-CM | POA: Diagnosis not present

## 2019-12-01 DIAGNOSIS — I10 Essential (primary) hypertension: Secondary | ICD-10-CM | POA: Diagnosis not present

## 2019-12-01 DIAGNOSIS — N185 Chronic kidney disease, stage 5: Secondary | ICD-10-CM | POA: Diagnosis not present

## 2019-12-01 NOTE — Patient Instructions (Signed)
Medication Instructions:  Your physician recommends that you continue on your current medications as directed. Please refer to the Current Medication list given to you today.  *If you need a refill on your cardiac medications before your next appointment, please call your pharmacy*  Lab Work: You will have labs drawn today: BMET  If you have labs (blood work) drawn today and your tests are completely normal, you will receive your results only by: Marland Kitchen MyChart Message (if you have MyChart) OR . A paper copy in the mail If you have any lab test that is abnormal or we need to change your treatment, we will call you to review the results.  Testing/Procedures: None ordered today  Follow-Up: On 02/03/20 at 3:20PM with Sherren Mocha, MD

## 2019-12-02 ENCOUNTER — Telehealth: Payer: Self-pay | Admitting: Internal Medicine

## 2019-12-02 ENCOUNTER — Telehealth: Payer: Self-pay | Admitting: Cardiovascular Disease

## 2019-12-02 DIAGNOSIS — N184 Chronic kidney disease, stage 4 (severe): Secondary | ICD-10-CM | POA: Diagnosis not present

## 2019-12-02 DIAGNOSIS — I130 Hypertensive heart and chronic kidney disease with heart failure and stage 1 through stage 4 chronic kidney disease, or unspecified chronic kidney disease: Secondary | ICD-10-CM | POA: Diagnosis not present

## 2019-12-02 DIAGNOSIS — I5033 Acute on chronic diastolic (congestive) heart failure: Secondary | ICD-10-CM | POA: Diagnosis not present

## 2019-12-02 DIAGNOSIS — E1122 Type 2 diabetes mellitus with diabetic chronic kidney disease: Secondary | ICD-10-CM | POA: Diagnosis not present

## 2019-12-02 DIAGNOSIS — I0981 Rheumatic heart failure: Secondary | ICD-10-CM | POA: Diagnosis not present

## 2019-12-02 DIAGNOSIS — D631 Anemia in chronic kidney disease: Secondary | ICD-10-CM | POA: Diagnosis not present

## 2019-12-02 LAB — BASIC METABOLIC PANEL
BUN/Creatinine Ratio: 15 (ref 10–24)
BUN: 46 mg/dL — ABNORMAL HIGH (ref 10–36)
CO2: 23 mmol/L (ref 20–29)
Calcium: 9.6 mg/dL (ref 8.6–10.2)
Chloride: 98 mmol/L (ref 96–106)
Creatinine, Ser: 3.06 mg/dL — ABNORMAL HIGH (ref 0.76–1.27)
GFR calc Af Amer: 19 mL/min/{1.73_m2} — ABNORMAL LOW (ref >59)
GFR calc non Af Amer: 17 mL/min/{1.73_m2} — ABNORMAL LOW (ref >59)
Glucose: 93 mg/dL (ref 65–99)
Potassium: 4.4 mmol/L (ref 3.5–5.2)
Sodium: 136 mmol/L (ref 134–144)

## 2019-12-02 NOTE — Telephone Encounter (Signed)
Called [name] from [facility] and left message informing her that pt does not take nitroglycerin, nor has pt been prescribed this medication and if she would like to give our office a call back concerning this matter, that she could reach the office at [phone].

## 2019-12-02 NOTE — Telephone Encounter (Signed)
*  STAT* If patient is at the pharmacy, call can be transferred to refill team.   1. Which medications need to be refilled? (please list name of each medication and dose if known) Nitroglycerin*  2. Which pharmacy/location (including street and city if local pharmacy) is medication to be sent to? [facility]  3. Do they need a 30 day or 90 day supply?  1 bottle

## 2019-12-02 NOTE — Telephone Encounter (Signed)
Patient is going out of town in a couple of weeks and the nurse with [facility] is calling requesting a order to be sent to [employer] for portable oxygen tank shoulder bag.

## 2019-12-03 ENCOUNTER — Encounter: Payer: Self-pay | Admitting: Internal Medicine

## 2019-12-03 ENCOUNTER — Other Ambulatory Visit: Payer: Medicare Other

## 2019-12-03 ENCOUNTER — Other Ambulatory Visit: Payer: Self-pay

## 2019-12-03 ENCOUNTER — Ambulatory Visit (INDEPENDENT_AMBULATORY_CARE_PROVIDER_SITE_OTHER): Payer: Medicare Other | Admitting: Internal Medicine

## 2019-12-03 VITALS — BP 126/58 | HR 65 | Temp 98.2°F | Ht 69.0 in | Wt 141.0 lb

## 2019-12-03 DIAGNOSIS — I10 Essential (primary) hypertension: Secondary | ICD-10-CM

## 2019-12-03 DIAGNOSIS — Z794 Long term (current) use of insulin: Secondary | ICD-10-CM

## 2019-12-03 DIAGNOSIS — D631 Anemia in chronic kidney disease: Secondary | ICD-10-CM | POA: Diagnosis not present

## 2019-12-03 DIAGNOSIS — E039 Hypothyroidism, unspecified: Secondary | ICD-10-CM

## 2019-12-03 DIAGNOSIS — I2510 Atherosclerotic heart disease of native coronary artery without angina pectoris: Secondary | ICD-10-CM

## 2019-12-03 DIAGNOSIS — I0981 Rheumatic heart failure: Secondary | ICD-10-CM | POA: Diagnosis not present

## 2019-12-03 DIAGNOSIS — J9611 Chronic respiratory failure with hypoxia: Secondary | ICD-10-CM | POA: Diagnosis not present

## 2019-12-03 DIAGNOSIS — I130 Hypertensive heart and chronic kidney disease with heart failure and stage 1 through stage 4 chronic kidney disease, or unspecified chronic kidney disease: Secondary | ICD-10-CM | POA: Diagnosis not present

## 2019-12-03 DIAGNOSIS — E1122 Type 2 diabetes mellitus with diabetic chronic kidney disease: Secondary | ICD-10-CM | POA: Diagnosis not present

## 2019-12-03 DIAGNOSIS — E119 Type 2 diabetes mellitus without complications: Secondary | ICD-10-CM

## 2019-12-03 DIAGNOSIS — I5033 Acute on chronic diastolic (congestive) heart failure: Secondary | ICD-10-CM | POA: Diagnosis not present

## 2019-12-03 DIAGNOSIS — N185 Chronic kidney disease, stage 5: Secondary | ICD-10-CM | POA: Diagnosis not present

## 2019-12-03 DIAGNOSIS — N184 Chronic kidney disease, stage 4 (severe): Secondary | ICD-10-CM | POA: Diagnosis not present

## 2019-12-03 LAB — CBC WITH DIFFERENTIAL/PLATELET
Basophils Absolute: 0.1 10*3/uL (ref 0.0–0.1)
Basophils Relative: 1 % (ref 0.0–3.0)
Eosinophils Absolute: 0.4 10*3/uL (ref 0.0–0.7)
Eosinophils Relative: 5.3 % — ABNORMAL HIGH (ref 0.0–5.0)
HCT: 27.8 % — ABNORMAL LOW (ref 39.0–52.0)
Hemoglobin: 9.3 g/dL — ABNORMAL LOW (ref 13.0–17.0)
Lymphocytes Relative: 16.4 % (ref 12.0–46.0)
Lymphs Abs: 1.3 10*3/uL (ref 0.7–4.0)
MCHC: 33.4 g/dL (ref 30.0–36.0)
MCV: 91.3 fl (ref 78.0–100.0)
Monocytes Absolute: 1 10*3/uL (ref 0.1–1.0)
Monocytes Relative: 13.4 % — ABNORMAL HIGH (ref 3.0–12.0)
Neutro Abs: 4.9 10*3/uL (ref 1.4–7.7)
Neutrophils Relative %: 63.9 % (ref 43.0–77.0)
Platelets: 315 10*3/uL (ref 150.0–400.0)
RBC: 3.04 Mil/uL — ABNORMAL LOW (ref 4.22–5.81)
RDW: 14.5 % (ref 11.5–15.5)
WBC: 7.6 10*3/uL (ref 4.0–10.5)

## 2019-12-03 LAB — BASIC METABOLIC PANEL
BUN: 48 mg/dL — ABNORMAL HIGH (ref 6–23)
CO2: 28 mEq/L (ref 19–32)
Calcium: 9.7 mg/dL (ref 8.4–10.5)
Chloride: 100 mEq/L (ref 96–112)
Creatinine, Ser: 3.17 mg/dL — ABNORMAL HIGH (ref 0.40–1.50)
GFR: 15.94 mL/min — ABNORMAL LOW (ref >60.00)
Glucose, Bld: 89 mg/dL (ref 70–99)
Potassium: 4.2 mEq/L (ref 3.5–5.1)
Sodium: 135 mEq/L (ref 135–145)

## 2019-12-03 MED ORDER — NITROGLYCERIN 0.4 MG SL SUBL: 0.4000 mg | SUBLINGUAL_TABLET | SUBLINGUAL | 3 refills | Status: AC | PRN

## 2019-12-03 MED ORDER — ATORVASTATIN CALCIUM 40 MG PO TABS
40.0000 mg | ORAL_TABLET | Freq: Every evening | ORAL | 3 refills | Status: DC
Start: 2019-12-03 — End: 2020-04-26

## 2019-12-03 MED ORDER — AMLODIPINE BESYLATE 10 MG PO TABS
10.0000 mg | ORAL_TABLET | Freq: Every evening | ORAL | 3 refills | Status: DC
Start: 2019-12-03 — End: 2020-03-10

## 2019-12-03 NOTE — Progress Notes (Signed)
Subjective:    Patient ID: Henry [name], male    DOB: 1924/09/23, 86 y.o.   MRN: [id number]  HPI  Here to f/u recent hospn 9/13-9/17 with ac on chronic chf, d/c home with home o2 to f/u with cards.  Not wearing o2 today, only uses in the home with exercise and not hardly at all in last few days.  Pt denies chest pain, increased sob or doe, wheezing, orthopnea, PND, increased LE swelling, palpitations, dizziness or syncope.  Pt denies new neurological symptoms such as new headache, or facial or extremity weakness or numbness   Pt denies polydipsia, polyuria Past Medical History:  Diagnosis Date  . Anemia   . CAD of autologous bypass graft   . Carotid artery disease ([place])   . Diabetes mellitus without complication ([place])   . DJD (degenerative joint disease)   . History of anemia of chronic disease   . Hypercholesteremia   . Hypertension   . NSTEMI (non-ST elevated myocardial infarction) ([place])   . PVD (peripheral vascular disease) with claudication ([place])   . Renal insufficiency   . Wears dentures   . Wears glasses    Past Surgical History:  Procedure Laterality Date  . A/V FISTULAGRAM N/A 07/15/2018   Procedure: A/V FISTULAGRAM - Left Arm;  Surgeon: Serafina Mitchell, MD;  Location: [facility];  Service: Cardiovascular;  Laterality: N/A;  . AV FISTULA PLACEMENT Left 03/20/2018   Procedure: ARTERIOVENOUS (AV) FISTULA CREATION LEFT ARM;  Surgeon: Serafina Mitchell, MD;  Location: [facility];  Service: Vascular;  Laterality: Left;  . cataract surgery    . COLONOSCOPY     Hx: of  . CORONARY ARTERY BYPASS GRAFT  2000   by Dr. Cyndia Bent  . LEFT HEART CATH AND CORS/GRAFTS ANGIOGRAPHY N/A 03/27/2018   Procedure: LEFT HEART CATH AND CORS/GRAFTS ANGIOGRAPHY;  Surgeon: Sherren Mocha, MD;  Location: [facility];  Service: Cardiovascular;  Laterality: N/A;  . LUMBAR LAMINECTOMY/DECOMPRESSION MICRODISCECTOMY N/A 09/10/2012   Procedure: LUMBAR DECOMPRESSION,  IN SITU FUSION LUMBAR 4-5;   Surgeon: Melina Schools, MD;  Location: [facility];  Service: Orthopedics;  Laterality: N/A;  . MULTIPLE TOOTH EXTRACTIONS    . TONSILLECTOMY    . UPPER EXTREMITY VENOGRAPHY Left 07/15/2018   Procedure: UPPER EXTREMITY VENOGRAPHY;  Surgeon: Serafina Mitchell, MD;  Location: [facility];  Service: Cardiovascular;  Laterality: Left;  UPPER ARM    reports that he quit smoking about 66 years ago. His smoking use included cigarettes. He has a 10.00 pack-year smoking history. He has never used smokeless tobacco. He reports previous alcohol use. He reports that he does not use drugs. family history includes Heart disease in his father and mother. No Known Allergies Current Outpatient Medications on File Prior to Visit  Medication Sig Dispense Refill  . aspirin EC 81 MG tablet Take 81 mg by mouth daily.     Marland Kitchen b complex-vitamin c-folic acid (NEPHRO-VITE) 0.8 MG TABS tablet Take 1 tablet by mouth at bedtime.    . calcium acetate (PHOSLO) 667 MG capsule Take 667 mg by mouth 2 (two) times daily with a meal.    . Darbepoetin Alfa (ARANESP) 100 MCG/0.5ML SOSY injection Inject 100 mcg into the skin every 30 (thirty) days.     . ferrous gluconate (FERGON) 324 MG tablet Take 324 mg by mouth daily.     . furosemide (LASIX) 40 MG tablet Take 1 tablet (40 mg total) by mouth every other day.  Also take additional dose for weight greater than 137 lb 30 tablet 2  . Lancets (ACCU-CHEK MULTICLIX) lancets USE TWICE DAILY. 102 each 5  . levothyroxine (SYNTHROID) 50 MCG tablet Take 50 mcg by mouth daily before breakfast.    . Multiple Vitamins-Minerals (PRESERVISION AREDS 2 PO) Take 1 tablet by mouth daily.     No current facility-administered medications on file prior to visit.   Review of Systems All otherwise neg per pt    Objective:   Physical Exam BP (!) 126/58 (BP Location: Left Arm, Patient Position: Sitting, Cuff Size: Large)   Pulse 65   Temp 98.2 F (36.8 C) (Oral)   Ht 5\' 9"  (1.753 m)   Wt 141 lb (64  kg)   SpO2 98%   BMI 20.82 kg/m  VS noted,  Constitutional: Pt appears in NAD HENT: Head: NCAT.  Right Ear: External ear normal.  Left Ear: External ear normal.  Eyes: . Pupils are equal, round, and reactive to light. Conjunctivae and EOM are normal Nose: without d/c or deformity Neck: Neck supple. Gross normal ROM Cardiovascular: Normal rate and regular rhythm.   Pulmonary/Chest: Effort normal and breath sounds without rales or wheezing.  Abd:  Soft, NT, ND, + BS, no organomegaly Neurological: Pt is alert. At baseline orientation, motor grossly intact Skin: Skin is warm. No rashes, other new lesions, no LE edema Psychiatric: Pt behavior is normal without agitation  All otherwise neg per pt  Lab Results  Component Value Date   WBC 7.0 11/13/2019   HGB 7.6 (L) 11/13/2019   HCT 22.8 (L) 11/13/2019   PLT 235 11/13/2019   GLUCOSE 93 12/01/2019   CHOL 158 08/13/2019   TRIG 113.0 08/13/2019   HDL 53.60 08/13/2019   LDLDIRECT 96.2 04/04/2007   LDLCALC 82 08/13/2019   ALT 16 11/09/2019   AST 21 11/09/2019   NA 136 12/01/2019   K 4.4 12/01/2019   CL 98 12/01/2019   CREATININE 3.06 (H) 12/01/2019   BUN 46 (H) 12/01/2019   CO2 23 12/01/2019   TSH 4.697 (H) 11/09/2019   PSA 1.47 01/03/2010   INR 1.13 03/25/2018   HGBA1C 5.8 08/13/2019   MICROALBUR 46.3 (H) 02/12/2019       Assessment & Plan:

## 2019-12-03 NOTE — Telephone Encounter (Signed)
Done hardcopy to maria 

## 2019-12-03 NOTE — Telephone Encounter (Signed)
Sent to Dr. Antoney. 

## 2019-12-03 NOTE — Patient Instructions (Addendum)
We will fax the order for the oxygen tank shoulder strap  Please take all new medication as prescribed - the nitroglyerin, as well as 2 others that only had 30 day rx at the last refills  Please continue all other medications as before, and refills have been done if requested.  Please have the pharmacy call with any other refills you may need.  Please continue your efforts at being more active, low cholesterol diet, and weight control.  Please keep your appointments with your specialists as you may have planned  Please go to the LAB at the blood drawing area for the tests to be done  You will be contacted by phone if any changes need to be made immediately.  Otherwise, you will receive a letter about your results with an explanation, but please check with MyChart first.  Please remember to sign up for MyChart if you have not done so, as this will be important to you in the future with finding out test results, communicating by private email, and scheduling acute appointments online when needed.  Please make an Appointment to return in 3 months (jan 2022)

## 2019-12-04 ENCOUNTER — Encounter: Payer: Self-pay | Admitting: Internal Medicine

## 2019-12-04 DIAGNOSIS — Z9181 History of falling: Secondary | ICD-10-CM

## 2019-12-04 DIAGNOSIS — I130 Hypertensive heart and chronic kidney disease with heart failure and stage 1 through stage 4 chronic kidney disease, or unspecified chronic kidney disease: Secondary | ICD-10-CM | POA: Diagnosis not present

## 2019-12-04 DIAGNOSIS — I5033 Acute on chronic diastolic (congestive) heart failure: Secondary | ICD-10-CM | POA: Diagnosis not present

## 2019-12-04 DIAGNOSIS — N184 Chronic kidney disease, stage 4 (severe): Secondary | ICD-10-CM | POA: Diagnosis not present

## 2019-12-04 DIAGNOSIS — Z7982 Long term (current) use of aspirin: Secondary | ICD-10-CM

## 2019-12-04 DIAGNOSIS — E039 Hypothyroidism, unspecified: Secondary | ICD-10-CM | POA: Diagnosis not present

## 2019-12-04 DIAGNOSIS — N179 Acute kidney failure, unspecified: Secondary | ICD-10-CM | POA: Diagnosis not present

## 2019-12-04 DIAGNOSIS — D631 Anemia in chronic kidney disease: Secondary | ICD-10-CM | POA: Diagnosis not present

## 2019-12-04 DIAGNOSIS — I252 Old myocardial infarction: Secondary | ICD-10-CM | POA: Diagnosis not present

## 2019-12-04 DIAGNOSIS — Z951 Presence of aortocoronary bypass graft: Secondary | ICD-10-CM

## 2019-12-04 DIAGNOSIS — I083 Combined rheumatic disorders of mitral, aortic and tricuspid valves: Secondary | ICD-10-CM | POA: Diagnosis not present

## 2019-12-04 DIAGNOSIS — I2510 Atherosclerotic heart disease of native coronary artery without angina pectoris: Secondary | ICD-10-CM | POA: Diagnosis not present

## 2019-12-04 DIAGNOSIS — E1122 Type 2 diabetes mellitus with diabetic chronic kidney disease: Secondary | ICD-10-CM | POA: Diagnosis not present

## 2019-12-04 DIAGNOSIS — Z9981 Dependence on supplemental oxygen: Secondary | ICD-10-CM

## 2019-12-04 DIAGNOSIS — I0981 Rheumatic heart failure: Secondary | ICD-10-CM | POA: Diagnosis not present

## 2019-12-04 DIAGNOSIS — E785 Hyperlipidemia, unspecified: Secondary | ICD-10-CM | POA: Diagnosis not present

## 2019-12-04 LAB — HEMOGLOBIN A1C
Hgb A1c MFr Bld: 5.7 % of total Hgb — ABNORMAL HIGH (ref <5.7)
Mean Plasma Glucose: 117 (calc)
eAG (mmol/L): 6.5 (calc)

## 2019-12-04 NOTE — Telephone Encounter (Signed)
rx has been faxed to [facility] on yesterday 12/03/2019.

## 2019-12-05 ENCOUNTER — Encounter: Payer: Self-pay | Admitting: Internal Medicine

## 2019-12-05 NOTE — Assessment & Plan Note (Signed)
stable overall by history and exam, recent data reviewed with pt, and pt to continue medical treatment as before,  to f/u any worsening symptoms or concerns  

## 2019-12-05 NOTE — Assessment & Plan Note (Addendum)
stable overall by history and exam, recent data reviewed with pt, and pt to continue medical treatment as before,  to f/u any worsening symptoms or concerns  I spent 41 minutes in preparing to see the patient by review of recent labs, imaging and procedures, obtaining and reviewing separately obtained history, communicating with the patient and family or caregiver, ordering medications, tests or procedures, and documenting clinical information in the EHR including the differential Dx, treatment, and any further evaluation and other management of chronic hypoxic resp failure, ckd, dm,  Htn, hypothyroidism

## 2019-12-07 DIAGNOSIS — E1122 Type 2 diabetes mellitus with diabetic chronic kidney disease: Secondary | ICD-10-CM | POA: Diagnosis not present

## 2019-12-07 DIAGNOSIS — I0981 Rheumatic heart failure: Secondary | ICD-10-CM | POA: Diagnosis not present

## 2019-12-07 DIAGNOSIS — I5033 Acute on chronic diastolic (congestive) heart failure: Secondary | ICD-10-CM | POA: Diagnosis not present

## 2019-12-07 DIAGNOSIS — N184 Chronic kidney disease, stage 4 (severe): Secondary | ICD-10-CM | POA: Diagnosis not present

## 2019-12-07 DIAGNOSIS — D631 Anemia in chronic kidney disease: Secondary | ICD-10-CM | POA: Diagnosis not present

## 2019-12-07 DIAGNOSIS — I130 Hypertensive heart and chronic kidney disease with heart failure and stage 1 through stage 4 chronic kidney disease, or unspecified chronic kidney disease: Secondary | ICD-10-CM | POA: Diagnosis not present

## 2019-12-08 DIAGNOSIS — E1122 Type 2 diabetes mellitus with diabetic chronic kidney disease: Secondary | ICD-10-CM | POA: Diagnosis not present

## 2019-12-08 DIAGNOSIS — D631 Anemia in chronic kidney disease: Secondary | ICD-10-CM | POA: Diagnosis not present

## 2019-12-08 DIAGNOSIS — N184 Chronic kidney disease, stage 4 (severe): Secondary | ICD-10-CM | POA: Diagnosis not present

## 2019-12-08 DIAGNOSIS — I5033 Acute on chronic diastolic (congestive) heart failure: Secondary | ICD-10-CM | POA: Diagnosis not present

## 2019-12-08 DIAGNOSIS — I130 Hypertensive heart and chronic kidney disease with heart failure and stage 1 through stage 4 chronic kidney disease, or unspecified chronic kidney disease: Secondary | ICD-10-CM | POA: Diagnosis not present

## 2019-12-08 DIAGNOSIS — I0981 Rheumatic heart failure: Secondary | ICD-10-CM | POA: Diagnosis not present

## 2019-12-09 ENCOUNTER — Telehealth: Payer: Self-pay | Admitting: Cardiovascular Disease

## 2019-12-09 DIAGNOSIS — I130 Hypertensive heart and chronic kidney disease with heart failure and stage 1 through stage 4 chronic kidney disease, or unspecified chronic kidney disease: Secondary | ICD-10-CM | POA: Diagnosis not present

## 2019-12-09 DIAGNOSIS — D631 Anemia in chronic kidney disease: Secondary | ICD-10-CM | POA: Diagnosis not present

## 2019-12-09 DIAGNOSIS — I0981 Rheumatic heart failure: Secondary | ICD-10-CM | POA: Diagnosis not present

## 2019-12-09 DIAGNOSIS — I5033 Acute on chronic diastolic (congestive) heart failure: Secondary | ICD-10-CM | POA: Diagnosis not present

## 2019-12-09 DIAGNOSIS — E1122 Type 2 diabetes mellitus with diabetic chronic kidney disease: Secondary | ICD-10-CM | POA: Diagnosis not present

## 2019-12-09 DIAGNOSIS — N184 Chronic kidney disease, stage 4 (severe): Secondary | ICD-10-CM | POA: Diagnosis not present

## 2019-12-09 NOTE — Telephone Encounter (Signed)
[name], RN, with [facility] states she was out to see the patient this morning. She states the patient had a rhonchi in his lower middle and upper, right lung. She states his BP was 138/52, which is low for him.

## 2019-12-09 NOTE — Telephone Encounter (Signed)
Called patient to check on him. Left message that his BP is normal and he will be called again in the AM.

## 2019-12-10 NOTE — Telephone Encounter (Signed)
Left message for patient to call back  

## 2019-12-11 ENCOUNTER — Telehealth: Payer: Self-pay | Admitting: Internal Medicine

## 2019-12-11 DIAGNOSIS — D631 Anemia in chronic kidney disease: Secondary | ICD-10-CM | POA: Diagnosis not present

## 2019-12-11 DIAGNOSIS — E1122 Type 2 diabetes mellitus with diabetic chronic kidney disease: Secondary | ICD-10-CM | POA: Diagnosis not present

## 2019-12-11 DIAGNOSIS — I0981 Rheumatic heart failure: Secondary | ICD-10-CM | POA: Diagnosis not present

## 2019-12-11 DIAGNOSIS — N184 Chronic kidney disease, stage 4 (severe): Secondary | ICD-10-CM | POA: Diagnosis not present

## 2019-12-11 DIAGNOSIS — I130 Hypertensive heart and chronic kidney disease with heart failure and stage 1 through stage 4 chronic kidney disease, or unspecified chronic kidney disease: Secondary | ICD-10-CM | POA: Diagnosis not present

## 2019-12-11 DIAGNOSIS — I5033 Acute on chronic diastolic (congestive) heart failure: Secondary | ICD-10-CM | POA: Diagnosis not present

## 2019-12-11 NOTE — Telephone Encounter (Signed)
I was able to send a community message over to [facility] them that the o2 order were faxed over and the confirmed fax I received was already sent over to scan.

## 2019-12-11 NOTE — Telephone Encounter (Signed)
Follow up: ° ° ° °Patient returning your call. °

## 2019-12-11 NOTE — Telephone Encounter (Signed)
Patient called and said that [facility] told him that they had not received the order for his oxygen.    Please call the patient: [phone]

## 2019-12-11 NOTE — Telephone Encounter (Signed)
Left message for patient that his BP was fine and to call if he has any symptoms/concerns.  Reiterated to him to have [name] call if assistance is needed.

## 2019-12-15 DIAGNOSIS — I130 Hypertensive heart and chronic kidney disease with heart failure and stage 1 through stage 4 chronic kidney disease, or unspecified chronic kidney disease: Secondary | ICD-10-CM | POA: Diagnosis not present

## 2019-12-15 DIAGNOSIS — E1122 Type 2 diabetes mellitus with diabetic chronic kidney disease: Secondary | ICD-10-CM | POA: Diagnosis not present

## 2019-12-15 DIAGNOSIS — N184 Chronic kidney disease, stage 4 (severe): Secondary | ICD-10-CM | POA: Diagnosis not present

## 2019-12-15 DIAGNOSIS — D631 Anemia in chronic kidney disease: Secondary | ICD-10-CM | POA: Diagnosis not present

## 2019-12-15 DIAGNOSIS — I5033 Acute on chronic diastolic (congestive) heart failure: Secondary | ICD-10-CM | POA: Diagnosis not present

## 2019-12-15 DIAGNOSIS — I0981 Rheumatic heart failure: Secondary | ICD-10-CM | POA: Diagnosis not present

## 2019-12-15 NOTE — Telephone Encounter (Addendum)
   Patient requesting new order for shoulder strap oxgen be sent to [facility], Intake item [facility] [phone]  Please also call [name] at [facility] to confirm new order has been sent [facility] [phone]

## 2019-12-16 DIAGNOSIS — I5033 Acute on chronic diastolic (congestive) heart failure: Secondary | ICD-10-CM | POA: Diagnosis not present

## 2019-12-16 DIAGNOSIS — I350 Nonrheumatic aortic (valve) stenosis: Secondary | ICD-10-CM | POA: Diagnosis not present

## 2019-12-16 DIAGNOSIS — I0981 Rheumatic heart failure: Secondary | ICD-10-CM | POA: Diagnosis not present

## 2019-12-16 DIAGNOSIS — I130 Hypertensive heart and chronic kidney disease with heart failure and stage 1 through stage 4 chronic kidney disease, or unspecified chronic kidney disease: Secondary | ICD-10-CM | POA: Diagnosis not present

## 2019-12-16 DIAGNOSIS — N184 Chronic kidney disease, stage 4 (severe): Secondary | ICD-10-CM | POA: Diagnosis not present

## 2019-12-16 DIAGNOSIS — I770 Arteriovenous fistula, acquired: Secondary | ICD-10-CM | POA: Diagnosis not present

## 2019-12-16 DIAGNOSIS — I5022 Chronic systolic (congestive) heart failure: Secondary | ICD-10-CM | POA: Diagnosis not present

## 2019-12-16 DIAGNOSIS — E1122 Type 2 diabetes mellitus with diabetic chronic kidney disease: Secondary | ICD-10-CM | POA: Diagnosis not present

## 2019-12-16 DIAGNOSIS — I129 Hypertensive chronic kidney disease with stage 1 through stage 4 chronic kidney disease, or unspecified chronic kidney disease: Secondary | ICD-10-CM | POA: Diagnosis not present

## 2019-12-16 DIAGNOSIS — E039 Hypothyroidism, unspecified: Secondary | ICD-10-CM | POA: Diagnosis not present

## 2019-12-16 DIAGNOSIS — N179 Acute kidney failure, unspecified: Secondary | ICD-10-CM | POA: Diagnosis not present

## 2019-12-16 DIAGNOSIS — N2581 Secondary hyperparathyroidism of renal origin: Secondary | ICD-10-CM | POA: Diagnosis not present

## 2019-12-16 DIAGNOSIS — D631 Anemia in chronic kidney disease: Secondary | ICD-10-CM | POA: Diagnosis not present

## 2019-12-16 DIAGNOSIS — R809 Proteinuria, unspecified: Secondary | ICD-10-CM | POA: Diagnosis not present

## 2019-12-16 DIAGNOSIS — I739 Peripheral vascular disease, unspecified: Secondary | ICD-10-CM | POA: Diagnosis not present

## 2019-12-16 NOTE — Telephone Encounter (Signed)
Sent to Dr. Watson. 

## 2019-12-16 NOTE — Telephone Encounter (Signed)
This was already done once  Not sure how doing this again would help if did not work the first time

## 2019-12-17 DIAGNOSIS — I0981 Rheumatic heart failure: Secondary | ICD-10-CM | POA: Diagnosis not present

## 2019-12-17 DIAGNOSIS — I5033 Acute on chronic diastolic (congestive) heart failure: Secondary | ICD-10-CM | POA: Diagnosis not present

## 2019-12-17 DIAGNOSIS — I130 Hypertensive heart and chronic kidney disease with heart failure and stage 1 through stage 4 chronic kidney disease, or unspecified chronic kidney disease: Secondary | ICD-10-CM | POA: Diagnosis not present

## 2019-12-17 DIAGNOSIS — E1122 Type 2 diabetes mellitus with diabetic chronic kidney disease: Secondary | ICD-10-CM | POA: Diagnosis not present

## 2019-12-17 DIAGNOSIS — N184 Chronic kidney disease, stage 4 (severe): Secondary | ICD-10-CM | POA: Diagnosis not present

## 2019-12-17 DIAGNOSIS — D631 Anemia in chronic kidney disease: Secondary | ICD-10-CM | POA: Diagnosis not present

## 2019-12-17 NOTE — Telephone Encounter (Signed)
° ° °  Please return call to patient °

## 2019-12-18 DIAGNOSIS — N184 Chronic kidney disease, stage 4 (severe): Secondary | ICD-10-CM | POA: Diagnosis not present

## 2019-12-18 DIAGNOSIS — N179 Acute kidney failure, unspecified: Secondary | ICD-10-CM | POA: Diagnosis not present

## 2019-12-18 DIAGNOSIS — Z9981 Dependence on supplemental oxygen: Secondary | ICD-10-CM | POA: Diagnosis not present

## 2019-12-18 DIAGNOSIS — Z9181 History of falling: Secondary | ICD-10-CM | POA: Diagnosis not present

## 2019-12-18 DIAGNOSIS — I5033 Acute on chronic diastolic (congestive) heart failure: Secondary | ICD-10-CM | POA: Diagnosis not present

## 2019-12-18 DIAGNOSIS — I083 Combined rheumatic disorders of mitral, aortic and tricuspid valves: Secondary | ICD-10-CM | POA: Diagnosis not present

## 2019-12-18 DIAGNOSIS — I2510 Atherosclerotic heart disease of native coronary artery without angina pectoris: Secondary | ICD-10-CM | POA: Diagnosis not present

## 2019-12-18 DIAGNOSIS — I0981 Rheumatic heart failure: Secondary | ICD-10-CM | POA: Diagnosis not present

## 2019-12-18 DIAGNOSIS — E1122 Type 2 diabetes mellitus with diabetic chronic kidney disease: Secondary | ICD-10-CM | POA: Diagnosis not present

## 2019-12-18 DIAGNOSIS — E039 Hypothyroidism, unspecified: Secondary | ICD-10-CM | POA: Diagnosis not present

## 2019-12-18 DIAGNOSIS — Z951 Presence of aortocoronary bypass graft: Secondary | ICD-10-CM | POA: Diagnosis not present

## 2019-12-18 DIAGNOSIS — I252 Old myocardial infarction: Secondary | ICD-10-CM | POA: Diagnosis not present

## 2019-12-18 DIAGNOSIS — E785 Hyperlipidemia, unspecified: Secondary | ICD-10-CM | POA: Diagnosis not present

## 2019-12-18 DIAGNOSIS — D631 Anemia in chronic kidney disease: Secondary | ICD-10-CM | POA: Diagnosis not present

## 2019-12-18 DIAGNOSIS — Z7982 Long term (current) use of aspirin: Secondary | ICD-10-CM | POA: Diagnosis not present

## 2019-12-18 DIAGNOSIS — I130 Hypertensive heart and chronic kidney disease with heart failure and stage 1 through stage 4 chronic kidney disease, or unspecified chronic kidney disease: Secondary | ICD-10-CM | POA: Diagnosis not present

## 2019-12-21 DIAGNOSIS — E1122 Type 2 diabetes mellitus with diabetic chronic kidney disease: Secondary | ICD-10-CM | POA: Diagnosis not present

## 2019-12-21 DIAGNOSIS — N184 Chronic kidney disease, stage 4 (severe): Secondary | ICD-10-CM | POA: Diagnosis not present

## 2019-12-21 DIAGNOSIS — I0981 Rheumatic heart failure: Secondary | ICD-10-CM | POA: Diagnosis not present

## 2019-12-21 DIAGNOSIS — D631 Anemia in chronic kidney disease: Secondary | ICD-10-CM | POA: Diagnosis not present

## 2019-12-21 DIAGNOSIS — I5033 Acute on chronic diastolic (congestive) heart failure: Secondary | ICD-10-CM | POA: Diagnosis not present

## 2019-12-21 DIAGNOSIS — I130 Hypertensive heart and chronic kidney disease with heart failure and stage 1 through stage 4 chronic kidney disease, or unspecified chronic kidney disease: Secondary | ICD-10-CM | POA: Diagnosis not present

## 2019-12-21 NOTE — Telephone Encounter (Signed)
    Patient requesting call from [facility]

## 2019-12-22 ENCOUNTER — Telehealth: Payer: Self-pay | Admitting: Cardiovascular Disease

## 2019-12-22 DIAGNOSIS — D631 Anemia in chronic kidney disease: Secondary | ICD-10-CM | POA: Diagnosis not present

## 2019-12-22 DIAGNOSIS — E1122 Type 2 diabetes mellitus with diabetic chronic kidney disease: Secondary | ICD-10-CM | POA: Diagnosis not present

## 2019-12-22 DIAGNOSIS — I5033 Acute on chronic diastolic (congestive) heart failure: Secondary | ICD-10-CM | POA: Diagnosis not present

## 2019-12-22 DIAGNOSIS — N184 Chronic kidney disease, stage 4 (severe): Secondary | ICD-10-CM | POA: Diagnosis not present

## 2019-12-22 DIAGNOSIS — I130 Hypertensive heart and chronic kidney disease with heart failure and stage 1 through stage 4 chronic kidney disease, or unspecified chronic kidney disease: Secondary | ICD-10-CM | POA: Diagnosis not present

## 2019-12-22 DIAGNOSIS — I0981 Rheumatic heart failure: Secondary | ICD-10-CM | POA: Diagnosis not present

## 2019-12-22 NOTE — Telephone Encounter (Signed)
Patient has been taking his diuretic everyday for the past two weeks because his weight has been above 137lbs.  No new swellen, or SOB.  Home health nurse was calling to give this information as an FYI, and to see if Dr. Burt Knack wanted to do anything different.

## 2019-12-23 ENCOUNTER — Other Ambulatory Visit: Payer: Self-pay

## 2019-12-23 ENCOUNTER — Other Ambulatory Visit: Payer: Medicare Other

## 2019-12-23 DIAGNOSIS — Z515 Encounter for palliative care: Secondary | ICD-10-CM

## 2019-12-23 NOTE — Telephone Encounter (Signed)
Tried to call pt phone rang out to vm. LVM for pt to call the clinic back.

## 2019-12-24 NOTE — Progress Notes (Signed)
COMMUNITY PALLIATIVE CARE SW NOTE  PATIENT NAME: Henry [name] DOB: 05-04-1924 MRN: [id number]  PRIMARY CARE PROVIDER: [name], MD  RESPONSIBLE PARTY:  Acct ID - Guarantor Home Phone Work Phone Relationship Acct Type  [id number] KYROS, SALZWEDEL* [phone]  Self P/F     [address], [place], [place] [address]     PLAN OF CARE and INTERVENTIONS:             1. GOALS OF CARE/ ADVANCE CARE PLANNING:  Goal is for patient to remain in his home, as independent as possible. Patient is a DNR, form in home.  2. SOCIAL/EMOTIONAL/SPIRITUAL ASSESSMENT/ INTERVENTIONS: SW completed home visit with patient. His son-Reedy was present for the first half of the visit. Patient observed coming down stairs on his stair lift. He then walked independently into the kitchen and sat and talked with SW. Patient denied pain. He reported that he was feeling good and felt he was almost back at his baseline. He was discharged from occupational therapy and aide service. He has 2/3 more visits with PT and will discharged. He is independent for personal care needs. He has had no falls and no issues with pain. He reports that his appetite is good and he is eating 3 regular meals per day. Patient's wife was placed into a ALF memory care and his daughter takes him to visit daily. He expressed concern about his finances as he has to privately pay for wife's placement. Education was provided about medicaid and [place] benefits. He asked SW to discuss these resources with his daughter-[name]. SW to provided supportive presence, supportive counseling, active listening, resource education and reassurance of support.SW will provide ongoing psychosocial assessment of needs, comfort and coping of patient and provide support as needed. 3. PATIENT/CAREGIVER EDUCATION/ COPING:  Patient is alert and oriented x3. He appears to be coping well. His children have been supportive. 4. PERSONAL EMERGENCY PLAN:  911 can be activated for  emergencies. 5. COMMUNITY RESOURCES COORDINATION/ HEALTH CARE NAVIGATION:  Patient is receiving RN and PT services through [employer]. 6. FINANCIAL/LEGAL CONCERNS/INTERVENTIONS:  Patient concern regarding finances as he is paying for wife's placement. SW provided education on [place] and [place] benefits.     SOCIAL HX:  Social History   Tobacco Use  . Smoking status: Former Smoker    Packs/day: 1.00    Years: 10.00    Pack years: 10.00    Types: Cigarettes    Quit date: 02/26/1953    Years since quitting: 66.8  . Smokeless tobacco: Never Used  Substance Use Topics  . Alcohol use: Not Currently    Alcohol/week: 0.0 standard drinks    CODE STATUS: DNR ADVANCED DIRECTIVES: No MOST FORM COMPLETE: No HOSPICE EDUCATION PROVIDED: No  PPS: Patient is alert and oriented x3. He is ambulating slowing, but independently. He is independent for personal care tasks.  Duration of visit and documentation: 75 minutes      Katheren Puller, LCSW

## 2019-12-25 NOTE — Telephone Encounter (Signed)
Spoke with Henry [name], the patient's [facility] nurse. She states he is consistently about 137-138 pounds now. No swelling, no more shortness of breath than usual. He has had his son in town who has been cooking for him and he has been eating more than usual. It is thought that he has actually gained healthy weight as he's been eating more carbs. Gave [name] verbal order to increase PRN lasix order to take over 138 pounds instead of 137 so he is not taking so much unnecessary Lasix. She will keep an eye on swelling/SOB and call if symptoms occur.  She was grateful for assistance.

## 2019-12-31 DIAGNOSIS — I5033 Acute on chronic diastolic (congestive) heart failure: Secondary | ICD-10-CM | POA: Diagnosis not present

## 2019-12-31 DIAGNOSIS — N184 Chronic kidney disease, stage 4 (severe): Secondary | ICD-10-CM | POA: Diagnosis not present

## 2019-12-31 DIAGNOSIS — D631 Anemia in chronic kidney disease: Secondary | ICD-10-CM | POA: Diagnosis not present

## 2019-12-31 DIAGNOSIS — E1122 Type 2 diabetes mellitus with diabetic chronic kidney disease: Secondary | ICD-10-CM | POA: Diagnosis not present

## 2019-12-31 DIAGNOSIS — I130 Hypertensive heart and chronic kidney disease with heart failure and stage 1 through stage 4 chronic kidney disease, or unspecified chronic kidney disease: Secondary | ICD-10-CM | POA: Diagnosis not present

## 2019-12-31 DIAGNOSIS — I0981 Rheumatic heart failure: Secondary | ICD-10-CM | POA: Diagnosis not present

## 2020-01-01 DIAGNOSIS — D631 Anemia in chronic kidney disease: Secondary | ICD-10-CM | POA: Diagnosis not present

## 2020-01-01 DIAGNOSIS — I130 Hypertensive heart and chronic kidney disease with heart failure and stage 1 through stage 4 chronic kidney disease, or unspecified chronic kidney disease: Secondary | ICD-10-CM | POA: Diagnosis not present

## 2020-01-01 DIAGNOSIS — I0981 Rheumatic heart failure: Secondary | ICD-10-CM | POA: Diagnosis not present

## 2020-01-01 DIAGNOSIS — I5033 Acute on chronic diastolic (congestive) heart failure: Secondary | ICD-10-CM | POA: Diagnosis not present

## 2020-01-01 DIAGNOSIS — N184 Chronic kidney disease, stage 4 (severe): Secondary | ICD-10-CM | POA: Diagnosis not present

## 2020-01-01 DIAGNOSIS — E1122 Type 2 diabetes mellitus with diabetic chronic kidney disease: Secondary | ICD-10-CM | POA: Diagnosis not present

## 2020-01-04 DIAGNOSIS — E1122 Type 2 diabetes mellitus with diabetic chronic kidney disease: Secondary | ICD-10-CM | POA: Diagnosis not present

## 2020-01-04 DIAGNOSIS — I130 Hypertensive heart and chronic kidney disease with heart failure and stage 1 through stage 4 chronic kidney disease, or unspecified chronic kidney disease: Secondary | ICD-10-CM | POA: Diagnosis not present

## 2020-01-04 DIAGNOSIS — I5033 Acute on chronic diastolic (congestive) heart failure: Secondary | ICD-10-CM | POA: Diagnosis not present

## 2020-01-04 DIAGNOSIS — N184 Chronic kidney disease, stage 4 (severe): Secondary | ICD-10-CM | POA: Diagnosis not present

## 2020-01-04 DIAGNOSIS — D631 Anemia in chronic kidney disease: Secondary | ICD-10-CM | POA: Diagnosis not present

## 2020-01-04 DIAGNOSIS — I0981 Rheumatic heart failure: Secondary | ICD-10-CM | POA: Diagnosis not present

## 2020-01-07 DIAGNOSIS — E1122 Type 2 diabetes mellitus with diabetic chronic kidney disease: Secondary | ICD-10-CM | POA: Diagnosis not present

## 2020-01-07 DIAGNOSIS — I5033 Acute on chronic diastolic (congestive) heart failure: Secondary | ICD-10-CM | POA: Diagnosis not present

## 2020-01-07 DIAGNOSIS — I130 Hypertensive heart and chronic kidney disease with heart failure and stage 1 through stage 4 chronic kidney disease, or unspecified chronic kidney disease: Secondary | ICD-10-CM | POA: Diagnosis not present

## 2020-01-07 DIAGNOSIS — I0981 Rheumatic heart failure: Secondary | ICD-10-CM | POA: Diagnosis not present

## 2020-01-07 DIAGNOSIS — D631 Anemia in chronic kidney disease: Secondary | ICD-10-CM | POA: Diagnosis not present

## 2020-01-07 DIAGNOSIS — N184 Chronic kidney disease, stage 4 (severe): Secondary | ICD-10-CM | POA: Diagnosis not present

## 2020-01-12 DIAGNOSIS — I5033 Acute on chronic diastolic (congestive) heart failure: Secondary | ICD-10-CM | POA: Diagnosis not present

## 2020-01-12 DIAGNOSIS — N184 Chronic kidney disease, stage 4 (severe): Secondary | ICD-10-CM | POA: Diagnosis not present

## 2020-01-12 DIAGNOSIS — I0981 Rheumatic heart failure: Secondary | ICD-10-CM | POA: Diagnosis not present

## 2020-01-12 DIAGNOSIS — D631 Anemia in chronic kidney disease: Secondary | ICD-10-CM | POA: Diagnosis not present

## 2020-01-12 DIAGNOSIS — I130 Hypertensive heart and chronic kidney disease with heart failure and stage 1 through stage 4 chronic kidney disease, or unspecified chronic kidney disease: Secondary | ICD-10-CM | POA: Diagnosis not present

## 2020-01-12 DIAGNOSIS — E1122 Type 2 diabetes mellitus with diabetic chronic kidney disease: Secondary | ICD-10-CM | POA: Diagnosis not present

## 2020-01-20 ENCOUNTER — Other Ambulatory Visit: Payer: Self-pay

## 2020-01-20 ENCOUNTER — Other Ambulatory Visit: Payer: Medicare Other | Admitting: *Deleted

## 2020-01-20 DIAGNOSIS — Z515 Encounter for palliative care: Secondary | ICD-10-CM

## 2020-01-28 NOTE — Progress Notes (Signed)
COMMUNITY PALLIATIVE CARE RN NOTE  PATIENT NAME: Henry [name] DOB: 1924/09/24 MRN: [id number]  PRIMARY CARE PROVIDER: [name], MD  RESPONSIBLE PARTY:  Acct ID - Guarantor Home Phone Work Phone Relationship Acct Type  [id number] ROMIE, TAY* [phone]  Self P/F     [address] [place], [place] [address]   Due to the COVID-19 crisis, this virtual check-in visit was done via telephone from my office and it was initiated and consent by this patient and or family.  PLAN OF CARE and INTERVENTION: 1. ADVANCE CARE PLANNING/GOALS OF CARE: Goal is for patient to remain at home and avoid hospitalizations. 2. PATIENT/CAREGIVER EDUCATION: Symptom management, safe mobility/transfers 3. DISEASE STATUS: Virtual check-in visit completed via telephone. Patient denies pain or any recent issues with shortness of breath.  He has completed all of his therapies (PT/OT) with [facility]. He states that he performs his exercises he was given on a daily basis. He continues to ambulate using his walker to help with his balance. He is now back to being able to perform all personal care independently. His daughter's prepare and cook the meals for him. He is back driving and visits his wife daily who resides at [facility]). He feels that overall he is doing much better since his hospitalization on 11/09/19 where he stayed for 4 days due to his chronic diastolic congestive heart failure. His overall condition continues to improve. His appetite is also improving. No complaints or concerns at this time. Will continue to monitor.    HISTORY OF PRESENT ILLNESS: This is a 86 yo patient with a h/o hypertension, hyperlipidemia, CAD s/p CABG, severe aortic stenosis, DM II and CKD Stage IV. Palliative care team continues to follow patient. Will visit patient monthly and PRN.    CODE STATUS: DNR ADVANCED DIRECTIVES: Y MOST FORM: no PPS: 60%   (Duration of visit and documentation 30  minutes)   Daryl Eastern, RN BSN

## 2020-01-29 DIAGNOSIS — H353211 Exudative age-related macular degeneration, right eye, with active choroidal neovascularization: Secondary | ICD-10-CM | POA: Diagnosis not present

## 2020-02-03 ENCOUNTER — Encounter: Payer: Self-pay | Admitting: Cardiovascular Disease

## 2020-02-03 ENCOUNTER — Ambulatory Visit (INDEPENDENT_AMBULATORY_CARE_PROVIDER_SITE_OTHER): Payer: Medicare Other | Admitting: Cardiovascular Disease

## 2020-02-03 ENCOUNTER — Other Ambulatory Visit: Payer: Self-pay

## 2020-02-03 VITALS — BP 150/60 | HR 74 | Ht 69.0 in | Wt 140.6 lb

## 2020-02-03 DIAGNOSIS — I2510 Atherosclerotic heart disease of native coronary artery without angina pectoris: Secondary | ICD-10-CM

## 2020-02-03 DIAGNOSIS — I350 Nonrheumatic aortic (valve) stenosis: Secondary | ICD-10-CM

## 2020-02-03 DIAGNOSIS — I5032 Chronic diastolic (congestive) heart failure: Secondary | ICD-10-CM

## 2020-02-03 NOTE — Progress Notes (Signed)
Cardiology Office Note:    Date:  02/10/2020   ID:  Henry [name], MRN [id number]  PCP:  Henry Borg, MD  [facility] Cardiologist:  Henry Mocha, MD  [place] Electrophysiologist:  None   Referring MD: Henry Borg, MD   Chief Complaint  Patient presents with  . Coronary Artery Disease    History of Present Illness:    Henry [name] is a 86 y.o. male with a hx of:  Coronary artery disease  ? S/p CABG in 2000 ? S/p NSTEMI 02/2018 in setting of acute diastolic CHF + IRJ>>1/8 grafts patent (chronic occlusions)>>Med Rx  Aortic stenosis ? Mod to severe (Echocardiogram 9/21): mean 18 mmHg, DI 0.26 - low flow low gradient) ? Not a candidate for TAVR.  Combined Systolic and Diastolic CHF ? Echocardiogram 9/21: EF 35-40 ? Echocardiogram 4/21: EF 50-55 ? Echocardiogram 02/2018: EF 50-55  Peripheral arterial disease - med Rx   Chronic kidney disease, stage V ? 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 During AVF placement in 02/2018 >> beta-blocker DC'd   Orthostatic intolerance   The patient is here alone today.  He was able to get to [place] for his grandsons wedding celebration.  The patient is doing reasonably well from a cardiac perspective.  He denies any recent chest pain or pressure.  No lightheadedness, syncope, or heart palpitations.  He has mild shortness of breath with physical activity.  Even in the presence of stage V kidney disease, the patient has elected for a palliative approach to his care in the context of his advanced age and comorbid conditions.  I participated in this decision making process when he was hospitalized earlier this year.  Past Medical History:  Diagnosis Date  . Anemia   . CAD of autologous bypass graft   . Carotid artery disease ([place])   . Diabetes mellitus without complication ([place])   . DJD (degenerative joint disease)   .  History of anemia of chronic disease   . Hypercholesteremia   . Hypertension   . NSTEMI (non-ST elevated myocardial infarction) ([place])   . PVD (peripheral vascular disease) with claudication ([place])   . Renal insufficiency   . Wears dentures   . Wears glasses     Past Surgical History:  Procedure Laterality Date  . A/V FISTULAGRAM N/A 07/15/2018   Procedure: A/V FISTULAGRAM - Left Arm;  Surgeon: Henry Mitchell, MD;  Location: [facility];  Service: Cardiovascular;  Laterality: N/A;  . AV FISTULA PLACEMENT Left 03/20/2018   Procedure: ARTERIOVENOUS (AV) FISTULA CREATION LEFT ARM;  Surgeon: Henry Mitchell, MD;  [facility];  Service: Vascular;  Laterality: Left;  . cataract surgery    . COLONOSCOPY     Hx: of  . CORONARY ARTERY BYPASS GRAFT  2000   by Dr. Cyndia [name]  . LEFT HEART CATH AND CORS/GRAFTS ANGIOGRAPHY N/A 03/27/2018   Procedure: LEFT HEART CATH AND CORS/GRAFTS ANGIOGRAPHY;  Surgeon: Henry Mocha, MD;  Location: [facility];  Service: Cardiovascular;  Laterality: N/A;  . LUMBAR LAMINECTOMY/DECOMPRESSION MICRODISCECTOMY N/A 09/10/2012   Procedure: LUMBAR DECOMPRESSION,  IN SITU FUSION LUMBAR 4-5;  Surgeon: Henry Schools, MD;  Location: [facility];  Service: Orthopedics;  Laterality: N/A;  . MULTIPLE TOOTH EXTRACTIONS    . TONSILLECTOMY    . UPPER EXTREMITY VENOGRAPHY Left 07/15/2018   Procedure: UPPER  EXTREMITY VENOGRAPHY;  Surgeon: Henry Mitchell, MD;  Location: [facility];  Service: Cardiovascular;  Laterality: Left;  UPPER ARM    Current Medications: Current Meds  Medication Sig  . amLODipine (NORVASC) 10 MG tablet Take 1 tablet (10 mg total) by mouth every evening.  Marland Kitchen aspirin EC 81 MG tablet Take 81 mg by mouth daily.   Marland Kitchen atorvastatin (LIPITOR) 40 MG tablet Take 1 tablet (40 mg total) by mouth every evening.  Marland Kitchen [name] complex-vitamin c-folic acid (NEPHRO-VITE) 0.8 MG TABS tablet Take 1 tablet by mouth at bedtime.  . calcium acetate (PHOSLO) 667 MG capsule  Take 667 mg by mouth 2 (two) times daily with a meal.  . Darbepoetin Alfa (ARANESP) 100 MCG/0.5ML SOSY injection Inject 100 mcg into the skin every 30 (thirty) days.   . ferrous gluconate (FERGON) 324 MG tablet Take 324 mg by mouth daily.   . furosemide (LASIX) 40 MG tablet Take 1 tablet (40 mg total) by mouth every other day. Also take additional dose for weight greater than 137 lb  . Lancets (ACCU-CHEK MULTICLIX) lancets USE TWICE DAILY.  Marland Kitchen levothyroxine (SYNTHROID) 50 MCG tablet Take 50 mcg by mouth daily before breakfast.  . Multiple Vitamins-Minerals (PRESERVISION AREDS 2 PO) Take 1 tablet by mouth daily.  . nitroGLYCERIN (NITROSTAT) 0.4 MG SL tablet Place 1 tablet (0.4 mg total) under the tongue every 5 (five) minutes as needed for chest pain.     Allergies:   Patient has no known allergies.   Social History   Socioeconomic History  . Marital status: Married    Spouse name: Henry [name]  . Number of children: 5  . Years of education: Not on file  . Highest education level: Not on file  Occupational History  . Occupation: retired    Fish farm manager: RETIRED  Tobacco Use  . Smoking status: Former Smoker    Packs/day: 1.00    Years: 10.00    Pack years: 10.00    Types: Cigarettes    Quit date: 02/26/1953    Years since quitting: 67.0  . Smokeless tobacco: Never Used  Vaping Use  . Vaping Use: Never used  Substance and Sexual Activity  . Alcohol use: Not Currently    Alcohol/week: 0.0 standard drinks  . Drug use: No  . Sexual activity: Not Currently  Other Topics Concern  . Not on file  Social History Narrative  . Not on file   Social Determinants of Health   Financial Resource Strain: Not on file  Food Insecurity: Not on file  Transportation Needs: Not on file  Physical Activity: Not on file  Stress: Not on file  Social Connections: Not on file     Family History: The patient's family history includes Heart disease in his father and mother.  ROS:   Please see the  history of present illness.    All other systems reviewed and are negative.  EKGs/Labs/Other Studies Reviewed:    The following studies were reviewed today: Echo 11-10-2019: 1. Left ventricular ejection fraction, by estimation, is 35 to 40%. The  left ventricle has moderately decreased function. The left ventricle  demonstrates global hypokinesis. Left ventricular diastolic function could  not be evaluated.  2. Right ventricular systolic function is mildly reduced. The right  ventricular size is normal. There is normal pulmonary artery systolic  pressure.  3. Left atrial size was mildly dilated.  4. The mitral valve is normal in structure. Mild mitral valve  regurgitation. No evidence of  mitral stenosis.  5. The aortic valve is tricuspid. There is severe calcifcation of the  aortic valve. There is moderate thickening of the aortic valve. Aortic  valve regurgitation is moderate. Moderate aortic valve stenosis. Aortic  valve area, by VTI measures 0.92 cm.  Aortic valve mean gradient measures 18.0 mmHg. Aortic valve Vmax measures  2.87 m/s. The dimensionless index is low at 0.26 and consistent with more  severe AS. SV index is reduced at 31. The AV mean gradient is likely  underestimated in the setting of LV  dysfunction and this likely represents at least moderate to severe low  flow low gradient aortic stenosis.  6. Aortic dilatation noted. There is borderline dilatation of the aortic  root, measuring 37 mm. There is dilatation of the ascending aorta,  measuring 38 mm.  7. The inferior vena cava is normal in size with greater than 50%  respiratory variability, suggesting right atrial pressure of 3 mmHg.   EKG:  EKG is not ordered today.    Recent Labs: 11/09/2019: ALT 16; [name] Natriuretic Peptide 979.3; Magnesium 2.1; TSH 4.697 02/03/2020: BUN 48; Creatinine, Ser 3.15; Hemoglobin 9.6; Platelets 245; Potassium 4.0; Sodium 137  Recent Lipid Panel    Component Value Date/Time    CHOL 158 08/13/2019 1203   CHOL 122 07/09/2018 1548   TRIG 113.0 08/13/2019 1203   HDL 53.60 08/13/2019 1203   HDL 47 07/09/2018 1548   CHOLHDL 3 08/13/2019 1203   VLDL 22.6 08/13/2019 1203   LDLCALC 82 08/13/2019 1203   LDLCALC 58 07/09/2018 1548   LDLDIRECT 96.2 04/04/2007 1049   Risk Assessment/Calculations:     Physical Exam:    VS:  BP (!) 150/60   Pulse 74   Ht 5\' 9"  (1.753 m)   Wt 140 lb 9.6 oz (63.8 kg)   SpO2 97%   BMI 20.76 kg/m     Wt Readings from Last 3 Encounters:  02/03/20 140 lb 9.6 oz (63.8 kg)  12/03/19 141 lb (64 kg)  12/01/19 138 lb (62.6 kg)     GEN: Elderly male in no acute distress HEENT: Normal NECK: No JVD; No carotid bruits LYMPHATICS: No lymphadenopathy CARDIAC: RRR, 3/6 harsh late peaking systolic murmur at the right upper sternal border with diminished A2 RESPIRATORY:  Clear to auscultation without rales, wheezing or rhonchi  ABDOMEN: Soft, non-tender, non-distended MUSCULOSKELETAL:  No edema; No deformity  SKIN: Warm and dry NEUROLOGIC:  Alert and oriented x 3 PSYCHIATRIC:  Normal affect   ASSESSMENT:    1. Coronary artery disease involving native coronary artery of native heart without angina pectoris   2. Nonrheumatic aortic valve stenosis   3. Chronic diastolic CHF (congestive heart failure) (HCC)    PLAN:    In order of problems listed above:  1. Patient is stable, currently without symptoms of angina.  He remains on amlodipine, aspirin, and atorvastatin.  He is not a candidate for beta-blockade because of history of AV block.  Doing well with a conservative approach to his care. 2. Exam consistent with at least moderately severe aortic stenosis.  Considering his comorbid conditions and advanced kidney disease, we do not anticipate any intervention. 3. Continues on every other day furosemide with additional dosing on a sliding scale based on his daily weights.  Does not appear volume overloaded on exam.  The patient is holding  his own.  I will update his labs today.  I will see him back in about 4 months.  Shared Decision Making/Informed Consent  Medication Adjustments/Labs and Tests Ordered: Current medicines are reviewed at length with the patient today.  Concerns regarding medicines are outlined above.  Orders Placed This Encounter  Procedures  . Basic metabolic panel  . CBC with Differential/Platelet   No orders of the defined types were placed in this encounter.   Patient Instructions  Medication Instructions:  Your provider recommends that you continue on your current medications as directed. Please refer to the Current Medication list given to you today.   *If you need a refill on your cardiac medications before your next appointment, please call your pharmacy*  Lab Work: TODAY! BMET, CBC If you have labs (blood work) drawn today and your tests are completely normal, you will receive your results only by: Marland Kitchen MyChart Message (if you have MyChart) OR . A paper copy in the mail If you have any lab test that is abnormal or we need to change your treatment, we will call you to review the results.  Follow-Up: You are scheduled for an appointment with Dr. Burt Knack on 05/04/2020 at 1:40PM.    Signed, Henry Mocha, MD  02/10/2020 2:16 AM    [place]

## 2020-02-03 NOTE — Patient Instructions (Addendum)
Medication Instructions:  Your provider recommends that you continue on your current medications as directed. Please refer to the Current Medication list given to you today.   *If you need a refill on your cardiac medications before your next appointment, please call your pharmacy*  Lab Work: TODAY! BMET, CBC If you have labs (blood work) drawn today and your tests are completely normal, you will receive your results only by: Marland Kitchen MyChart Message (if you have MyChart) OR . A paper copy in the mail If you have any lab test that is abnormal or we need to change your treatment, we will call you to review the results.  Follow-Up: You are scheduled for an appointment with Dr. Burt Knack on 05/04/2020 at 1:40PM.

## 2020-02-04 LAB — BASIC METABOLIC PANEL
BUN/Creatinine Ratio: 15 (ref 10–24)
BUN: 48 mg/dL — ABNORMAL HIGH (ref 10–36)
CO2: 21 mmol/L (ref 20–29)
Calcium: 9.5 mg/dL (ref 8.6–10.2)
Chloride: 103 mmol/L (ref 96–106)
Creatinine, Ser: 3.15 mg/dL — ABNORMAL HIGH (ref 0.76–1.27)
GFR calc Af Amer: 19 mL/min/{1.73_m2} — ABNORMAL LOW (ref >59)
GFR calc non Af Amer: 16 mL/min/{1.73_m2} — ABNORMAL LOW (ref >59)
Glucose: 90 mg/dL (ref 65–99)
Potassium: 4 mmol/L (ref 3.5–5.2)
Sodium: 137 mmol/L (ref 134–144)

## 2020-02-04 LAB — CBC WITH DIFFERENTIAL/PLATELET
Basophils Absolute: 0.1 10*3/uL (ref 0.0–0.2)
Basos: 1 %
EOS (ABSOLUTE): 0.3 10*3/uL (ref 0.0–0.4)
Eos: 4 %
Hematocrit: 28.1 % — ABNORMAL LOW (ref 37.5–51.0)
Hemoglobin: 9.6 g/dL — ABNORMAL LOW (ref 13.0–17.7)
Immature Grans (Abs): 0 10*3/uL (ref 0.0–0.1)
Immature Granulocytes: 0 %
Lymphocytes Absolute: 1.4 10*3/uL (ref 0.7–3.1)
Lymphs: 18 %
MCH: 30.1 pg (ref 26.6–33.0)
MCHC: 34.2 g/dL (ref 31.5–35.7)
MCV: 88 fL (ref 79–97)
Monocytes Absolute: 1.2 10*3/uL — ABNORMAL HIGH (ref 0.1–0.9)
Monocytes: 15 %
Neutrophils Absolute: 4.9 10*3/uL (ref 1.4–7.0)
Neutrophils: 62 %
Platelets: 245 10*3/uL (ref 150–450)
RBC: 3.19 x10E6/uL — ABNORMAL LOW (ref 4.14–5.80)
RDW: 13.5 % (ref 11.6–15.4)
WBC: 7.9 10*3/uL (ref 3.4–10.8)

## 2020-02-10 ENCOUNTER — Other Ambulatory Visit: Payer: Self-pay

## 2020-02-10 ENCOUNTER — Encounter: Payer: Self-pay | Admitting: Cardiovascular Disease

## 2020-02-10 ENCOUNTER — Telehealth: Payer: Self-pay

## 2020-02-10 ENCOUNTER — Encounter: Payer: Self-pay | Admitting: Internal Medicine

## 2020-02-10 ENCOUNTER — Ambulatory Visit (INDEPENDENT_AMBULATORY_CARE_PROVIDER_SITE_OTHER): Payer: Medicare Other | Admitting: Internal Medicine

## 2020-02-10 VITALS — BP 140/52 | HR 74 | Temp 98.2°F | Ht 69.0 in | Wt 146.0 lb

## 2020-02-10 DIAGNOSIS — I10 Essential (primary) hypertension: Secondary | ICD-10-CM | POA: Diagnosis not present

## 2020-02-10 DIAGNOSIS — I5032 Chronic diastolic (congestive) heart failure: Secondary | ICD-10-CM | POA: Diagnosis not present

## 2020-02-10 DIAGNOSIS — E1165 Type 2 diabetes mellitus with hyperglycemia: Secondary | ICD-10-CM

## 2020-02-10 DIAGNOSIS — E039 Hypothyroidism, unspecified: Secondary | ICD-10-CM

## 2020-02-10 DIAGNOSIS — I2510 Atherosclerotic heart disease of native coronary artery without angina pectoris: Secondary | ICD-10-CM

## 2020-02-10 DIAGNOSIS — N185 Chronic kidney disease, stage 5: Secondary | ICD-10-CM | POA: Diagnosis not present

## 2020-02-10 DIAGNOSIS — E7800 Pure hypercholesterolemia, unspecified: Secondary | ICD-10-CM

## 2020-02-10 DIAGNOSIS — Z23 Encounter for immunization: Secondary | ICD-10-CM

## 2020-02-10 NOTE — Progress Notes (Signed)
Subjective:    Patient ID: Henry [name], male    DOB: [birth year], 86 y.o.   MRN: [id number]  HPI  Here to f/u; overall doing ok,  Pt denies chest pain, increasing sob or doe, wheezing, orthopnea, PND, increased LE swelling, palpitations, dizziness or syncope.  Pt denies new neurological symptoms such as new headache, or facial or extremity weakness or numbness.  Pt denies polydipsia, polyuria, or low sugar episode.  Pt states overall good compliance with meds, mostly trying to follow appropriate diet, with wt overall stable,  but little exercise however.  In fact has had worsening balance difficulty though still able to walk 1-2 blocks for exercise per day.  Due prevnar 13.  Denies hyper or hypo thyroid symptoms such as voice, skin or hair change. Past Medical History:  Diagnosis Date  . Anemia   . CAD of autologous bypass graft   . Carotid artery disease ([place])   . Diabetes mellitus without complication ([place])   . DJD (degenerative joint disease)   . History of anemia of chronic disease   . Hypercholesteremia   . Hypertension   . NSTEMI (non-ST elevated myocardial infarction) ([place])   . PVD (peripheral vascular disease) with claudication ([place])   . Renal insufficiency   . Wears dentures   . Wears glasses    Past Surgical History:  Procedure Laterality Date  . A/V FISTULAGRAM N/A 07/15/2018   Procedure: A/V FISTULAGRAM - Left Arm;  Surgeon: Serafina Mitchell, MD;  Location: [facility];  Service: Cardiovascular;  Laterality: N/A;  . AV FISTULA PLACEMENT Left 03/20/2018   Procedure: ARTERIOVENOUS (AV) FISTULA CREATION LEFT ARM;  Surgeon: Serafina Mitchell, MD;  [facility];  Service: Vascular;  Laterality: Left;  . cataract surgery    . COLONOSCOPY     Hx: of  . CORONARY ARTERY BYPASS GRAFT  2000   by Dr. Cyndia Bent  . LEFT HEART CATH AND CORS/GRAFTS ANGIOGRAPHY N/A 03/27/2018   Procedure: LEFT HEART CATH AND CORS/GRAFTS ANGIOGRAPHY;  Surgeon: Sherren Mocha, MD;  Location: [facility];  Service: Cardiovascular;  Laterality: N/A;  . LUMBAR LAMINECTOMY/DECOMPRESSION MICRODISCECTOMY N/A 09/10/2012   Procedure: LUMBAR DECOMPRESSION,  IN SITU FUSION LUMBAR 4-5;  Surgeon: Melina Schools, MD;  Location: [facility];  Service: Orthopedics;  Laterality: N/A;  . MULTIPLE TOOTH EXTRACTIONS    . TONSILLECTOMY    . UPPER EXTREMITY VENOGRAPHY Left 07/15/2018   Procedure: UPPER EXTREMITY VENOGRAPHY;  Surgeon: Serafina Mitchell, MD;  Location: [facility];  Service: Cardiovascular;  Laterality: Left;  UPPER ARM    reports that he quit smoking about 67 years ago. His smoking use included cigarettes. He has a 10.00 pack-year smoking history. He has never used smokeless tobacco. He reports previous alcohol use. He reports that he does not use drugs. family history includes Heart disease in his father and mother. No Known Allergies Current Outpatient Medications on File Prior to Visit  Medication Sig Dispense Refill  . amLODipine (NORVASC) 10 MG tablet Take 1 tablet (10 mg total) by mouth every evening. 90 tablet 3  . aspirin EC 81 MG tablet Take 81 mg by mouth daily.     Marland Kitchen atorvastatin (LIPITOR) 40 MG tablet Take 1 tablet (40 mg total) by mouth every evening. 90 tablet 3  . b complex-vitamin c-folic acid (NEPHRO-VITE) 0.8 MG TABS tablet Take 1 tablet by mouth at bedtime.    . calcium acetate (PHOSLO) 667 MG capsule Take 667 mg  by mouth 2 (two) times daily with a meal.    . Darbepoetin Alfa (ARANESP) 100 MCG/0.5ML SOSY injection Inject 100 mcg into the skin every 30 (thirty) days.     . ferrous gluconate (FERGON) 324 MG tablet Take 324 mg by mouth daily.    . furosemide (LASIX) 40 MG tablet Take 1 tablet (40 mg total) by mouth every other day. Also take additional dose for weight greater than 137 lb 30 tablet 2  . Lancets (ACCU-CHEK MULTICLIX) lancets USE TWICE DAILY. 102 each 5  . levothyroxine (SYNTHROID) 50 MCG tablet Take 50 mcg by mouth daily before breakfast.    . Multiple  Vitamins-Minerals (PRESERVISION AREDS 2 PO) Take 1 tablet by mouth daily.    . nitroGLYCERIN (NITROSTAT) 0.4 MG SL tablet Place 1 tablet (0.4 mg total) under the tongue every 5 (five) minutes as needed for chest pain. 50 tablet 3   No current facility-administered medications on file prior to visit.   Review of Systems All otherwise neg per pt     Objective:   Physical Exam BP (!) 140/52 (BP Location: Left Arm, Patient Position: Sitting, Cuff Size: Large)   Pulse 74   Temp 98.2 F (36.8 C) (Oral)   Ht 5\' 9"  (1.753 m)   Wt 146 lb (66.2 kg)   SpO2 96%   BMI 21.56 kg/m  VS noted,  Constitutional: Pt appears in NAD HENT: Head: NCAT.  Right Ear: External ear normal.  Left Ear: External ear normal.  Eyes: . Pupils are equal, round, and reactive to light. Conjunctivae and EOM are normal Nose: without d/c or deformity Neck: Neck supple. Gross normal ROM Cardiovascular: Normal rate and regular rhythm.   Pulmonary/Chest: Effort normal and breath sounds without rales or wheezing.  Abd:  Soft, NT, ND, + BS, no organomegaly Neurological: Pt is alert. At baseline orientation, motor grossly intact Skin: Skin is warm. No rashes, other new lesions, no LE edema Psychiatric: Pt behavior is normal without agitation  All otherwise neg per pt  Lab Results  Component Value Date   WBC 7.9 02/03/2020   HGB 9.6 (L) 02/03/2020   HCT 28.1 (L) 02/03/2020   PLT 245 02/03/2020   GLUCOSE 90 02/03/2020   CHOL 158 08/13/2019   TRIG 113.0 08/13/2019   HDL 53.60 08/13/2019   LDLDIRECT 96.2 04/04/2007   LDLCALC 82 08/13/2019   ALT 16 11/09/2019   AST 21 11/09/2019   NA 137 02/03/2020   K 4.0 02/03/2020   CL 103 02/03/2020   CREATININE 3.15 (H) 02/03/2020   BUN 48 (H) 02/03/2020   CO2 21 02/03/2020   TSH 4.697 (H) 11/09/2019   PSA 1.47 01/03/2010   INR 1.13 03/25/2018   HGBA1C 5.7 (H) 12/03/2019   MICROALBUR 46.3 (H) 02/12/2019      Assessment & Plan:

## 2020-02-10 NOTE — Telephone Encounter (Signed)
(  4:45p) SW attempted to call back to check status and schedule a visit. SW did not receive an answer.

## 2020-02-10 NOTE — Patient Instructions (Signed)
You had the Prevnar 13 pneumonia shot today  Please continue all other medications as before, and refills have been done if requested.  Please have the pharmacy call with any other refills you may need.  Please continue your efforts at being more active, low cholesterol diet, and weight control.  Please keep your appointments with your specialists as you may have planned  Please make an Appointment to return in 6 months, or sooner if needed

## 2020-02-14 ENCOUNTER — Encounter: Payer: Self-pay | Admitting: Internal Medicine

## 2020-02-14 NOTE — Assessment & Plan Note (Addendum)
stable overall by history and exam, recent data reviewed with pt, and pt to continue medical treatment as before,  to f/u any worsening symptoms or concerns  I spent 31 minutes in preparing to see the patient by review of recent labs, imaging and procedures, obtaining and reviewing separately obtained history, communicating with the patient and family or caregiver, ordering medications, tests or procedures, and documenting clinical information in the EHR including the differential Dx, treatment, and any further evaluation and other management of hld, dm, hypothyroidism, htn, ckd chf

## 2020-02-14 NOTE — Assessment & Plan Note (Signed)
stable overall by history and exam, recent data reviewed with pt, and pt to continue medical treatment as before,  to f/u any worsening symptoms or concerns  

## 2020-02-15 ENCOUNTER — Other Ambulatory Visit: Payer: Medicare Other

## 2020-02-15 ENCOUNTER — Telehealth: Payer: Self-pay | Admitting: Internal Medicine

## 2020-02-15 ENCOUNTER — Other Ambulatory Visit: Payer: Self-pay

## 2020-02-15 VITALS — BP 150/54 | HR 74 | Temp 98.6°F

## 2020-02-15 DIAGNOSIS — Z515 Encounter for palliative care: Secondary | ICD-10-CM

## 2020-02-15 MED ORDER — DIPHENOXYLATE-ATROPINE 2.5-0.025 MG PO TABS: 1.0000 | ORAL_TABLET | Freq: Four times a day (QID) | ORAL | 0 refills | Status: AC | PRN

## 2020-02-15 NOTE — Telephone Encounter (Signed)
Sent to Dr. Lisa. 

## 2020-02-15 NOTE — Progress Notes (Signed)
PATIENT NAME: TYDUS SANMIGUEL DOB: 1924/06/14 MRN: [id number]  PRIMARY CARE PROVIDER: [name], MD  RESPONSIBLE PARTY:  Acct ID - Guarantor Home Phone Work Phone Relationship Acct Type  [id number] HILDA, WEXLER* [phone]  Self P/F     [address] [place], [place] [address]    PLAN OF CARE and INTERVENTIONS:               1.  GOALS OF CARE/ ADVANCE CARE PLANNING:  Remain home and independent.               2.  PATIENT/CAREGIVER EDUCATION:  Hydration and safety               4. PERSONAL EMERGENCY PLAN:  Activate 911 for emergencies.               5.  DISEASE STATUS:  Joint visit with [place], SW.  Greeted at the door by patient.    Patient reports having diarrhea for the last week. He appears pale in color.  Increase weakness as patient is now using a cane for stability.  He has started on imodium on Friday and is taking no more than 4 tabs a day in addition to probiotics.  Patient is also reporting some abdominal cramping.  Patient is drinking Gatorade without sugar.   Phone call completed to Dr. Gwynn Burly office to provide an update on above.  Blood sugar checked this morning and fasting blood sugar is 87.   Virtual visit completed with daughter [name] to provide an update on    HISTORY OF PRESENT ILLNESS:  86 year old male with hx of CHF.  Patient is being followed by Palliative Care monthly and PRN.  CODE STATUS: DNR ADVANCED DIRECTIVES: No MOST FORM: No PPS: 60%   PHYSICAL EXAM:   VITALS: Temp 98.6 F BP 150/54 P 74 O2 sats 95% LUNGS: CTA CARDIAC: HRR EXTREMITIES: No edema SKIN: Warm and dry to touch.   NEURO: alert and oriented x3        Lorenza Burton, RN

## 2020-02-15 NOTE — Progress Notes (Signed)
COMMUNITY PALLIATIVE CARE SW NOTE  PATIENT NAME: Henry [name] DOB: Jul 03, 1924 MRN: [id number]  PRIMARY CARE PROVIDER: [name], MD  RESPONSIBLE PARTY:  Acct ID - Guarantor Home Phone Work Phone Relationship Acct Type  [id number] [name], [name]* [phone]  Self P/F     [address] [place], [place] [address]     PLAN OF CARE and INTERVENTIONS:             1. GOALS OF CARE/ ADVANCE CARE PLANNING:  Goal is for patient to remain in his home, as independent as possible. Patient is a DNR, form in home 1.  2. SOCIAL/EMOTIONAL/SPIRITUAL ASSESSMENT/ INTERVENTIONS: SW and RN- Henry [name] completed a joint visit with patient at his home. Patient answered the door. He met with the team in the kitchen. Patient advised that he has had watery diarrhea for about a week. He has taken imodium and probiotic but has not seen any relief from it. Patient report that he is changing his underwear about 3- 4 x day due to the diarrhea leaking. He has short period of dizziness when he wakes up in the morning before he starts moving. He has intermittent pain to his stomach which he contributes to the diarrhea.He saw Henry [name] last week but did not discuss this issue with him as he thought issue would get better. Patient was using his cane to ambulate which is different from previous visit. He appears to be pale during this visit. RN completed his vitals and called his PCP (Henry [name]) to update him and obtain advice on how to proceed with patient. Patient continues to live alone with daily contact from five children. He visits his wife at her memory care facility daily when he is up to it.Patient expressed concern for his wife as her intake is not good and she usually sleeps through his visit. SW provided supportive presence, reassurance of support, active listening, face-time his daughter to include her in visit and provide update, and assessed his needs, comfort, safety and coping. SW will continue to provide  psychosocial support to patient through routine visits.  3. PATIENT/CAREGIVER EDUCATION/ COPING:  Patient is alert and oriented x3. He has good support through his family. He appears to be coping adequately. 4. PERSONAL EMERGENCY PLAN:  911 can be activated for emergencies. 5. COMMUNITY RESOURCES COORDINATION/ HEALTH CARE NAVIGATION:  SW reinforced palliative care support.  6. FINANCIAL/LEGAL CONCERNS/INTERVENTIONS:  None     SOCIAL HX:  Social History   Tobacco Use  . Smoking status: Former Smoker    Packs/day: 1.00    Years: 10.00    Pack years: 10.00    Types: Cigarettes    Quit date: 02/26/1953    Years since quitting: 67.0  . Smokeless tobacco: Never Used  Substance Use Topics  . Alcohol use: Not Currently    Alcohol/week: 0.0 standard drinks    CODE STATUS: DNR ADVANCED DIRECTIVES:No MOST FORM COMPLETE:  No HOSPICE EDUCATION PROVIDED: No  PPS: Patient is alert and oriented x3. He ambulates with a cane. He is independent for  ADL's.   Duration of visit and documentation:75 minutes       Henry Puller, LCSW

## 2020-02-15 NOTE — Telephone Encounter (Signed)
If no fever, blood or pain I can send in lomotil prn - done erx  Let us know if he is having any of these symptoms

## 2020-02-15 NOTE — Telephone Encounter (Signed)
Henry [name] with palliative care calling stating the patient has been having diarrhea x 1 week and he started taking imodium on Friday no more than 4 capsules a day and he is also on a probiotic but it has not helped so they are wondering what they should do now.  [phone] Ok to lvm

## 2020-02-16 ENCOUNTER — Telehealth: Payer: Self-pay

## 2020-02-16 NOTE — Telephone Encounter (Signed)
Spoke with Almyra Free and was able to inform her of Dr. Gwynn Burly note and instructions.  Pt does not have any of the following sxs Fever, Blood or ain at this time.

## 2020-02-16 NOTE — Telephone Encounter (Signed)
940 am.  Phone call received from [facility] at Dr. Gwynn Burly office.  Lomotil has been ordered and can be taken for diarrhea.  Should patient develop pain, fever or blood in stool he should go to an urgent care or ED.  946 a.  Phone call to patient to advise of above.  Patient will stop taking imodium and start on lomotil.  He will continue with probiotic daily.  I have advised patient to contact Palliative Care should symptoms not resolve in the next couple of days.  No other concerns voiced at this time.

## 2020-03-02 DIAGNOSIS — H353211 Exudative age-related macular degeneration, right eye, with active choroidal neovascularization: Secondary | ICD-10-CM | POA: Diagnosis not present

## 2020-03-04 ENCOUNTER — Inpatient Hospital Stay (HOSPITAL_COMMUNITY)
Admission: EM | Admit: 2020-03-04 | Discharge: 2020-03-10 | DRG: 280 | Disposition: A | Discharge status: Skilled Nursing Facility | Payer: Medicare Other | Attending: Internal Medicine | Admitting: Internal Medicine

## 2020-03-04 ENCOUNTER — Encounter (HOSPITAL_COMMUNITY): Payer: Self-pay | Admitting: Internal Medicine

## 2020-03-04 ENCOUNTER — Other Ambulatory Visit: Payer: Self-pay

## 2020-03-04 ENCOUNTER — Emergency Department (HOSPITAL_COMMUNITY): Payer: Medicare Other

## 2020-03-04 ENCOUNTER — Ambulatory Visit: Payer: Medicare Other | Admitting: Cardiovascular Disease

## 2020-03-04 ENCOUNTER — Inpatient Hospital Stay (HOSPITAL_COMMUNITY): Payer: Medicare Other

## 2020-03-04 DIAGNOSIS — R0902 Hypoxemia: Secondary | ICD-10-CM | POA: Diagnosis not present

## 2020-03-04 DIAGNOSIS — N184 Chronic kidney disease, stage 4 (severe): Secondary | ICD-10-CM | POA: Diagnosis present

## 2020-03-04 DIAGNOSIS — I214 Non-ST elevation (NSTEMI) myocardial infarction: Secondary | ICD-10-CM | POA: Diagnosis present

## 2020-03-04 DIAGNOSIS — R001 Bradycardia, unspecified: Secondary | ICD-10-CM | POA: Diagnosis present

## 2020-03-04 DIAGNOSIS — M6259 Muscle wasting and atrophy, not elsewhere classified, multiple sites: Secondary | ICD-10-CM | POA: Diagnosis not present

## 2020-03-04 DIAGNOSIS — I150 Renovascular hypertension: Secondary | ICD-10-CM | POA: Diagnosis not present

## 2020-03-04 DIAGNOSIS — J9601 Acute respiratory failure with hypoxia: Secondary | ICD-10-CM | POA: Diagnosis present

## 2020-03-04 DIAGNOSIS — I252 Old myocardial infarction: Secondary | ICD-10-CM | POA: Diagnosis not present

## 2020-03-04 DIAGNOSIS — I130 Hypertensive heart and chronic kidney disease with heart failure and stage 1 through stage 4 chronic kidney disease, or unspecified chronic kidney disease: Principal | ICD-10-CM | POA: Diagnosis present

## 2020-03-04 DIAGNOSIS — I509 Heart failure, unspecified: Secondary | ICD-10-CM | POA: Diagnosis not present

## 2020-03-04 DIAGNOSIS — R2689 Other abnormalities of gait and mobility: Secondary | ICD-10-CM | POA: Diagnosis not present

## 2020-03-04 DIAGNOSIS — E039 Hypothyroidism, unspecified: Secondary | ICD-10-CM | POA: Diagnosis not present

## 2020-03-04 DIAGNOSIS — E7800 Pure hypercholesterolemia, unspecified: Secondary | ICD-10-CM | POA: Diagnosis not present

## 2020-03-04 DIAGNOSIS — I5020 Unspecified systolic (congestive) heart failure: Secondary | ICD-10-CM | POA: Diagnosis present

## 2020-03-04 DIAGNOSIS — I959 Hypotension, unspecified: Secondary | ICD-10-CM | POA: Diagnosis not present

## 2020-03-04 DIAGNOSIS — I248 Other forms of acute ischemic heart disease: Secondary | ICD-10-CM | POA: Diagnosis not present

## 2020-03-04 DIAGNOSIS — I739 Peripheral vascular disease, unspecified: Secondary | ICD-10-CM | POA: Diagnosis not present

## 2020-03-04 DIAGNOSIS — Z951 Presence of aortocoronary bypass graft: Secondary | ICD-10-CM | POA: Diagnosis not present

## 2020-03-04 DIAGNOSIS — Z87891 Personal history of nicotine dependence: Secondary | ICD-10-CM

## 2020-03-04 DIAGNOSIS — I10 Essential (primary) hypertension: Secondary | ICD-10-CM | POA: Diagnosis not present

## 2020-03-04 DIAGNOSIS — D631 Anemia in chronic kidney disease: Secondary | ICD-10-CM | POA: Diagnosis present

## 2020-03-04 DIAGNOSIS — D638 Anemia in other chronic diseases classified elsewhere: Secondary | ICD-10-CM | POA: Diagnosis not present

## 2020-03-04 DIAGNOSIS — N179 Acute kidney failure, unspecified: Secondary | ICD-10-CM | POA: Diagnosis present

## 2020-03-04 DIAGNOSIS — I255 Ischemic cardiomyopathy: Secondary | ICD-10-CM | POA: Diagnosis present

## 2020-03-04 DIAGNOSIS — E785 Hyperlipidemia, unspecified: Secondary | ICD-10-CM | POA: Diagnosis not present

## 2020-03-04 DIAGNOSIS — Z79899 Other long term (current) drug therapy: Secondary | ICD-10-CM

## 2020-03-04 DIAGNOSIS — Z20822 Contact with and (suspected) exposure to covid-19: Secondary | ICD-10-CM | POA: Diagnosis present

## 2020-03-04 DIAGNOSIS — I5043 Acute on chronic combined systolic (congestive) and diastolic (congestive) heart failure: Secondary | ICD-10-CM | POA: Diagnosis present

## 2020-03-04 DIAGNOSIS — E876 Hypokalemia: Secondary | ICD-10-CM | POA: Diagnosis present

## 2020-03-04 DIAGNOSIS — Z66 Do not resuscitate: Secondary | ICD-10-CM | POA: Diagnosis present

## 2020-03-04 DIAGNOSIS — E1151 Type 2 diabetes mellitus with diabetic peripheral angiopathy without gangrene: Secondary | ICD-10-CM | POA: Diagnosis present

## 2020-03-04 DIAGNOSIS — M2550 Pain in unspecified joint: Secondary | ICD-10-CM | POA: Diagnosis not present

## 2020-03-04 DIAGNOSIS — I350 Nonrheumatic aortic (valve) stenosis: Secondary | ICD-10-CM | POA: Diagnosis present

## 2020-03-04 DIAGNOSIS — R54 Age-related physical debility: Secondary | ICD-10-CM | POA: Diagnosis present

## 2020-03-04 DIAGNOSIS — Z7982 Long term (current) use of aspirin: Secondary | ICD-10-CM

## 2020-03-04 DIAGNOSIS — I517 Cardiomegaly: Secondary | ICD-10-CM | POA: Diagnosis not present

## 2020-03-04 DIAGNOSIS — R41841 Cognitive communication deficit: Secondary | ICD-10-CM | POA: Diagnosis not present

## 2020-03-04 DIAGNOSIS — E782 Mixed hyperlipidemia: Secondary | ICD-10-CM | POA: Diagnosis present

## 2020-03-04 DIAGNOSIS — Z8249 Family history of ischemic heart disease and other diseases of the circulatory system: Secondary | ICD-10-CM

## 2020-03-04 DIAGNOSIS — Z79890 Hormone replacement therapy (postmenopausal): Secondary | ICD-10-CM

## 2020-03-04 DIAGNOSIS — I110 Hypertensive heart disease with heart failure: Secondary | ICD-10-CM | POA: Diagnosis not present

## 2020-03-04 DIAGNOSIS — J811 Chronic pulmonary edema: Secondary | ICD-10-CM | POA: Diagnosis not present

## 2020-03-04 DIAGNOSIS — M6281 Muscle weakness (generalized): Secondary | ICD-10-CM | POA: Diagnosis not present

## 2020-03-04 DIAGNOSIS — R2681 Unsteadiness on feet: Secondary | ICD-10-CM | POA: Diagnosis not present

## 2020-03-04 DIAGNOSIS — R5383 Other fatigue: Secondary | ICD-10-CM | POA: Diagnosis not present

## 2020-03-04 DIAGNOSIS — I2510 Atherosclerotic heart disease of native coronary artery without angina pectoris: Secondary | ICD-10-CM | POA: Diagnosis present

## 2020-03-04 DIAGNOSIS — D649 Anemia, unspecified: Secondary | ICD-10-CM | POA: Diagnosis not present

## 2020-03-04 DIAGNOSIS — R42 Dizziness and giddiness: Secondary | ICD-10-CM | POA: Diagnosis present

## 2020-03-04 DIAGNOSIS — R5381 Other malaise: Secondary | ICD-10-CM | POA: Diagnosis not present

## 2020-03-04 DIAGNOSIS — Z9111 Patient's noncompliance with dietary regimen: Secondary | ICD-10-CM

## 2020-03-04 DIAGNOSIS — I5041 Acute combined systolic (congestive) and diastolic (congestive) heart failure: Secondary | ICD-10-CM | POA: Diagnosis not present

## 2020-03-04 DIAGNOSIS — I5021 Acute systolic (congestive) heart failure: Secondary | ICD-10-CM | POA: Diagnosis not present

## 2020-03-04 DIAGNOSIS — E1122 Type 2 diabetes mellitus with diabetic chronic kidney disease: Secondary | ICD-10-CM | POA: Diagnosis present

## 2020-03-04 DIAGNOSIS — Z602 Problems related to living alone: Secondary | ICD-10-CM | POA: Diagnosis present

## 2020-03-04 DIAGNOSIS — R778 Other specified abnormalities of plasma proteins: Secondary | ICD-10-CM | POA: Diagnosis not present

## 2020-03-04 DIAGNOSIS — R0602 Shortness of breath: Secondary | ICD-10-CM | POA: Diagnosis not present

## 2020-03-04 DIAGNOSIS — J9691 Respiratory failure, unspecified with hypoxia: Secondary | ICD-10-CM | POA: Diagnosis present

## 2020-03-04 DIAGNOSIS — J189 Pneumonia, unspecified organism: Secondary | ICD-10-CM | POA: Diagnosis not present

## 2020-03-04 DIAGNOSIS — E119 Type 2 diabetes mellitus without complications: Secondary | ICD-10-CM | POA: Diagnosis not present

## 2020-03-04 DIAGNOSIS — Z7401 Bed confinement status: Secondary | ICD-10-CM | POA: Diagnosis not present

## 2020-03-04 DIAGNOSIS — I5023 Acute on chronic systolic (congestive) heart failure: Secondary | ICD-10-CM | POA: Diagnosis not present

## 2020-03-04 DIAGNOSIS — I359 Nonrheumatic aortic valve disorder, unspecified: Secondary | ICD-10-CM | POA: Diagnosis not present

## 2020-03-04 LAB — CBC WITH DIFFERENTIAL/PLATELET
Abs Immature Granulocytes: 0.04 10*3/uL (ref 0.00–0.07)
Basophils Absolute: 0.1 10*3/uL (ref 0.0–0.1)
Basophils Relative: 1 %
Eosinophils Absolute: 0.1 10*3/uL (ref 0.0–0.5)
Eosinophils Relative: 1 %
HCT: 33.5 % — ABNORMAL LOW (ref 39.0–52.0)
Hemoglobin: 10.6 g/dL — ABNORMAL LOW (ref 13.0–17.0)
Immature Granulocytes: 0 %
Lymphocytes Relative: 10 %
Lymphs Abs: 1 10*3/uL (ref 0.7–4.0)
MCH: 28.6 pg (ref 26.0–34.0)
MCHC: 31.6 g/dL (ref 30.0–36.0)
MCV: 90.3 fL (ref 80.0–100.0)
Monocytes Absolute: 1.2 10*3/uL — ABNORMAL HIGH (ref 0.1–1.0)
Monocytes Relative: 12 %
Neutro Abs: 7.7 10*3/uL (ref 1.7–7.7)
Neutrophils Relative %: 76 %
Platelets: 259 10*3/uL (ref 150–400)
RBC: 3.71 MIL/uL — ABNORMAL LOW (ref 4.22–5.81)
RDW: 15.8 % — ABNORMAL HIGH (ref 11.5–15.5)
WBC: 10.2 10*3/uL (ref 4.0–10.5)
nRBC: 0 % (ref 0.0–0.2)

## 2020-03-04 LAB — COMPREHENSIVE METABOLIC PANEL
ALT: 15 U/L (ref 0–44)
AST: 28 U/L (ref 15–41)
Albumin: 3.5 g/dL (ref 3.5–5.0)
Alkaline Phosphatase: 71 U/L (ref 38–126)
Anion gap: 12 (ref 5–15)
BUN: 47 mg/dL — ABNORMAL HIGH (ref 8–23)
CO2: 19 mmol/L — ABNORMAL LOW (ref 22–32)
Calcium: 9 mg/dL (ref 8.9–10.3)
Chloride: 103 mmol/L (ref 98–111)
Creatinine, Ser: 3.29 mg/dL — ABNORMAL HIGH (ref 0.61–1.24)
GFR, Estimated: 17 mL/min — ABNORMAL LOW (ref >60)
Glucose, Bld: 116 mg/dL — ABNORMAL HIGH (ref 70–99)
Potassium: 3.8 mmol/L (ref 3.5–5.1)
Sodium: 134 mmol/L — ABNORMAL LOW (ref 135–145)
Total Bilirubin: 0.8 mg/dL (ref 0.3–1.2)
Total Protein: 7.1 g/dL (ref 6.5–8.1)

## 2020-03-04 LAB — RESP PANEL BY RT-PCR (FLU A&B, COVID) ARPGX2
Influenza A by PCR: NEGATIVE
Influenza B by PCR: NEGATIVE
SARS Coronavirus 2 by RT PCR: NEGATIVE

## 2020-03-04 LAB — HEPARIN LEVEL (UNFRACTIONATED): Heparin Unfractionated: 0.52 IU/mL (ref 0.30–0.70)

## 2020-03-04 LAB — TSH: TSH: 6.507 u[IU]/mL — ABNORMAL HIGH (ref 0.350–4.500)

## 2020-03-04 LAB — BRAIN NATRIURETIC PEPTIDE: B Natriuretic Peptide: 808 pg/mL — ABNORMAL HIGH (ref 0.0–100.0)

## 2020-03-04 LAB — TROPONIN I (HIGH SENSITIVITY)
Troponin I (High Sensitivity): 344 ng/L (ref <18)
Troponin I (High Sensitivity): 760 ng/L (ref <18)

## 2020-03-04 LAB — GLUCOSE, CAPILLARY: Glucose-Capillary: 190 mg/dL — ABNORMAL HIGH (ref 70–99)

## 2020-03-04 MED ORDER — HEPARIN BOLUS VIA INFUSION
2500.0000 [IU] | Freq: Once | INTRAVENOUS | Status: AC
  Administered 2020-03-04: 2500 [IU] via INTRAVENOUS
  Filled 2020-03-04: qty 2500

## 2020-03-04 MED ORDER — AMLODIPINE BESYLATE 10 MG PO TABS
10.0000 mg | ORAL_TABLET | Freq: Every evening | ORAL | Status: DC
  Administered 2020-03-04 – 2020-03-06 (×3): 10 mg via ORAL
  Filled 2020-03-04 (×4): qty 1

## 2020-03-04 MED ORDER — SODIUM CHLORIDE 0.9% FLUSH: 3.0000 mL | INTRAVENOUS | Status: DC | PRN

## 2020-03-04 MED ORDER — HEPARIN SODIUM (PORCINE) 5000 UNIT/ML IJ SOLN
5000.0000 [IU] | Freq: Three times a day (TID) | INTRAMUSCULAR | Status: DC
  Administered 2020-03-04: 5000 [IU] via SUBCUTANEOUS
  Filled 2020-03-04: qty 1

## 2020-03-04 MED ORDER — FUROSEMIDE 10 MG/ML IJ SOLN
40.0000 mg | Freq: Two times a day (BID) | INTRAMUSCULAR | Status: DC
  Administered 2020-03-04: 40 mg via INTRAVENOUS
  Filled 2020-03-04: qty 4

## 2020-03-04 MED ORDER — ACETAMINOPHEN 325 MG PO TABS: 650.0000 mg | ORAL_TABLET | ORAL | Status: DC | PRN

## 2020-03-04 MED ORDER — ONDANSETRON HCL 4 MG/2ML IJ SOLN: 4.0000 mg | Freq: Four times a day (QID) | INTRAMUSCULAR | Status: DC | PRN

## 2020-03-04 MED ORDER — INSULIN ASPART 100 UNIT/ML ~~LOC~~ SOLN
0.0000 [IU] | Freq: Three times a day (TID) | SUBCUTANEOUS | Status: DC
  Administered 2020-03-05 (×2): 1 [IU] via SUBCUTANEOUS
  Administered 2020-03-06: 2 [IU] via SUBCUTANEOUS

## 2020-03-04 MED ORDER — ATORVASTATIN CALCIUM 40 MG PO TABS
40.0000 mg | ORAL_TABLET | Freq: Every evening | ORAL | Status: DC
  Administered 2020-03-04 – 2020-03-09 (×5): 40 mg via ORAL
  Filled 2020-03-04 (×6): qty 1

## 2020-03-04 MED ORDER — CALCIUM ACETATE (PHOS BINDER) 667 MG PO CAPS
667.0000 mg | ORAL_CAPSULE | Freq: Two times a day (BID) | ORAL | Status: DC
  Administered 2020-03-04 – 2020-03-10 (×11): 667 mg via ORAL
  Filled 2020-03-04 (×13): qty 1

## 2020-03-04 MED ORDER — HEPARIN (PORCINE) 25000 UT/250ML-% IV SOLN
1150.0000 [IU]/h | INTRAVENOUS | Status: DC
  Administered 2020-03-04 – 2020-03-05 (×2): 750 [IU]/h via INTRAVENOUS
  Administered 2020-03-06: 1050 [IU]/h via INTRAVENOUS
  Filled 2020-03-04 (×4): qty 250

## 2020-03-04 MED ORDER — ASPIRIN EC 81 MG PO TBEC
81.0000 mg | DELAYED_RELEASE_TABLET | Freq: Every day | ORAL | Status: DC
Start: 2020-03-04 — End: 2020-03-10
  Administered 2020-03-04 – 2020-03-10 (×7): 81 mg via ORAL
  Filled 2020-03-04 (×7): qty 1

## 2020-03-04 MED ORDER — POLYVINYL ALCOHOL 1.4 % OP SOLN
1.0000 [drp] | Freq: Every day | OPHTHALMIC | Status: DC | PRN
  Filled 2020-03-04: qty 15

## 2020-03-04 MED ORDER — FUROSEMIDE 10 MG/ML IJ SOLN
40.0000 mg | Freq: Once | INTRAMUSCULAR | Status: AC
  Administered 2020-03-04: 40 mg via INTRAVENOUS
  Filled 2020-03-04: qty 4

## 2020-03-04 MED ORDER — SODIUM CHLORIDE 0.9 % IV SOLN: 250.0000 mL | INTRAVENOUS | Status: DC | PRN

## 2020-03-04 MED ORDER — INSULIN ASPART 100 UNIT/ML ~~LOC~~ SOLN: 0.0000 [IU] | Freq: Every day | SUBCUTANEOUS | Status: DC

## 2020-03-04 MED ORDER — LEVOTHYROXINE SODIUM 50 MCG PO TABS
50.0000 ug | ORAL_TABLET | Freq: Every day | ORAL | Status: DC
  Administered 2020-03-05 – 2020-03-10 (×6): 50 ug via ORAL
  Filled 2020-03-04 (×6): qty 1

## 2020-03-04 MED ORDER — CALCITRIOL 0.25 MCG PO CAPS
0.2500 ug | ORAL_CAPSULE | ORAL | Status: DC
  Administered 2020-03-07: 0.25 ug via ORAL
  Filled 2020-03-04: qty 1

## 2020-03-04 MED ORDER — FUROSEMIDE 10 MG/ML IJ SOLN
80.0000 mg | Freq: Two times a day (BID) | INTRAMUSCULAR | Status: DC
  Administered 2020-03-05 – 2020-03-07 (×5): 80 mg via INTRAVENOUS
  Filled 2020-03-04 (×5): qty 8

## 2020-03-04 MED ORDER — SODIUM CHLORIDE 0.9% FLUSH
3.0000 mL | Freq: Two times a day (BID) | INTRAVENOUS | Status: DC
  Administered 2020-03-04 – 2020-03-09 (×10): 3 mL via INTRAVENOUS

## 2020-03-04 NOTE — Consult Note (Addendum)
Cardiology Consultation:   Patient ID: GRAVES NIPP MRN: [id number]; DOB: 06-12-1924  Admit date: 03/04/2020 Date of Consult: 03/04/2020  Primary Care Provider: [name], MD [facility] Cardiologist: Henry Mocha, MD    Patient Profile:   Henry [name] is a 87 y.o. male with a hx of CAD status post CABG with graft failure, moderate to severe aortic stenosis, not a candidate for TAVR, chronic combined CHF, peripheral arterial disease, chronic kidney disease stage IV with failed dialysis access, hypertension, diabetes mellitus, hyperlipidemia second-degree type I AV block, and orthostatic intolerance who is being seen today for the evaluation of CHF at the request of Dr. Tamala [name].  1. Hx of CAD Coronary artery disease  ? S/p CABG in 2000 ? S/p NSTEMI 02/2018 in setting of acute diastolic CHF + JQZ>>0/0 grafts patent (chronic occlusions)>>Med Rx 2. Aortic stenosis ? Mod to severe (Echocardiogram 9/21): mean 18 mmHg, DI 0.26 - low flow low gradient) ? Not a candidate for TAVR. 1. Combined Systolic and Diastolic CHF ? Echocardiogram 9/21: EF 35-40 ? Echocardiogram 4/21: EF 50-55 ? Echocardiogram 02/2018: EF 50-55  Patient was doing relatively well when last seen by Henry [name] February 03, 2020.  History of Present Illness:   Henry [name] presented for evaluation of progressive worsening shortness of breath for past 1 week.  Initially he had shortness of breath with ambulation which has progressed and now with laying down as well.  Worsened especially in past 2 days.  Some chest tightness.  Symptoms similar when he was admitted January 2020 with non-STEMI, however, this is worse episode.  He is mainly complaining  Of breathing.  Oxygen saturation normal.  His weight has been stable.  Compliant with his medication and low-sodium diet.  Upon arrival he was noted hypoxic at 86% which improved on 6 L oxygen.  BNP 808 High-sensitivity troponin 344>> 760 Creatinine 3.29 (Scr ranging  from 3-3.7 recently) Potassium 3.8 Today panel negative for COVID and influenza Chest x-ray with worsening CHF and may be pneumonia.  He was given IV Lasix 40 mg x 1.  He was given IV heparin bolus and now on drip.   Past Medical History:  Diagnosis Date  . Anemia   . CAD of autologous bypass graft   . Carotid artery disease ([place])   . Diabetes mellitus without complication ([place])   . DJD (degenerative joint disease)   . History of anemia of chronic disease   . Hypercholesteremia   . Hypertension   . NSTEMI (non-ST elevated myocardial infarction) ([place])   . PVD (peripheral vascular disease) with claudication ([place])   . Renal insufficiency   . Wears dentures   . Wears glasses     Past Surgical History:  Procedure Laterality Date  . A/V FISTULAGRAM N/A 07/15/2018   Procedure: A/V FISTULAGRAM - Left Arm;  Surgeon: Henry Mitchell, MD;  Location: [place] [facility];  Service: Cardiovascular;  Laterality: N/A;  . AV FISTULA PLACEMENT Left 03/20/2018   Procedure: ARTERIOVENOUS (AV) FISTULA CREATION LEFT ARM;  Surgeon: Henry Mitchell, MD;  Location: [facility];  Service: Vascular;  Laterality: Left;  . cataract surgery    . COLONOSCOPY     Hx: of  . CORONARY ARTERY BYPASS GRAFT  2000   by Dr. Cyndia [name]  . LEFT HEART CATH AND CORS/GRAFTS ANGIOGRAPHY N/A 03/27/2018   Procedure: LEFT HEART CATH AND CORS/GRAFTS ANGIOGRAPHY;  Surgeon: Henry Mocha, MD;  Location: [place] [facility];  Service: Cardiovascular;  Laterality: N/A;  .  LUMBAR LAMINECTOMY/DECOMPRESSION MICRODISCECTOMY N/A 09/10/2012   Procedure: LUMBAR DECOMPRESSION,  IN SITU FUSION LUMBAR 4-5;  Surgeon: Henry Schools, MD;  Location: [facility];  Service: Orthopedics;  Laterality: N/A;  . MULTIPLE TOOTH EXTRACTIONS    . TONSILLECTOMY    . UPPER EXTREMITY VENOGRAPHY Left 07/15/2018   Procedure: UPPER EXTREMITY VENOGRAPHY;  Surgeon: Henry Mitchell, MD;  Location: [place] [facility];  Service: Cardiovascular;  Laterality: Left;  UPPER ARM      Inpatient Medications: Scheduled Meds: . amLODipine  10 mg Oral QPM  . aspirin EC  81 mg Oral Daily  . atorvastatin  40 mg Oral QPM  . [START ON 03/07/2020] calcitRIOL  0.25 mcg Oral Weekly  . calcium acetate  667 mg Oral BID WC  . furosemide  40 mg Intravenous BID  . [START ON 03/05/2020] levothyroxine  50 mcg Oral QAC breakfast  . sodium chloride flush  3 mL Intravenous Q12H   Continuous Infusions: . sodium chloride    . heparin 750 Units/hr (03/04/20 1553)   PRN Meds: sodium chloride, acetaminophen, ondansetron (ZOFRAN) IV, Propylene Glycol, sodium chloride flush  Allergies:   No Known Allergies  Social History:   Social History   Socioeconomic History  . Marital status: Married    Spouse name: Henry [name]  . Number of children: 5  . Years of education: Not on file  . Highest education level: Not on file  Occupational History  . Occupation: retired    Fish farm manager: RETIRED  Tobacco Use  . Smoking status: Former Smoker    Packs/day: 1.00    Years: 10.00    Pack years: 10.00    Types: Cigarettes    Quit date: 02/26/1953    Years since quitting: 67.0  . Smokeless tobacco: Never Used  Vaping Use  . Vaping Use: Never used  Substance and Sexual Activity  . Alcohol use: Not Currently    Alcohol/week: 0.0 standard drinks  . Drug use: No  . Sexual activity: Not Currently  Other Topics Concern  . Not on file  Social History Narrative  . Not on file   Social Determinants of Health   Financial Resource Strain: Not on file  Food Insecurity: Not on file  Transportation Needs: Not on file  Physical Activity: Not on file  Stress: Not on file  Social Connections: Not on file  Intimate Partner Violence: Not on file    Family History:   Family History  Problem Relation Age of Onset  . Heart disease Mother   . Heart disease Father      ROS:  Please see the history of present illness.  All other ROS reviewed and negative.     Physical Exam/Data:   Vitals:    03/04/20 1439 03/04/20 1530 03/04/20 1548 03/04/20 1558  BP: 126/78 (!) 158/71 (!) 158/71   Pulse: 98 92 94   Resp: (!) 21 (!) 27 (!) 26   Temp:   97.9 F (36.6 C) 98.1 F (36.7 C)  TempSrc:   Oral Oral  SpO2: 90% 95% 92%   Weight:      Height:        Intake/Output Summary (Last 24 hours) at 03/04/2020 1628 Last data filed at 03/04/2020 1239 Gross per 24 hour  Intake 3 ml  Output -  Net 3 ml   Last 3 Weights 03/04/2020 02/10/2020 02/03/2020  Weight (lbs) 139 lb 146 lb 140 lb 9.6 oz  Weight (kg) 63.05 kg 66.225 kg 63.776 kg  Body mass index is 20.53 kg/m.  General:  Well nourished, well developed, in no acute distress HEENT: normal Lymph: no adenopathy Neck: + JVD Endocrine:  No thryomegaly Vascular: No carotid bruits; FA pulses 2+ bilaterally without bruits  Cardiac:  normal S1, S2; RRR; 3-0/1 systolic murmur  Lungs:  Diminished breath sound  Abd: soft, nontender, no hepatomegaly  Ext: no edema Musculoskeletal:  No deformities, BUE and BLE strength normal and equal Skin: warm and dry  Neuro:  CNs 2-12 intact, no focal abnormalities noted Psych:  Normal affect   EKG:  The EKG was personally reviewed and demonstrates: Sinus rhythm at a rate of 84 bpm, incomplete left bundle branch block, LVH Telemetry:  Telemetry was personally reviewed and demonstrates:  N/A seen in ER  Relevant CV Studies:  Echo 10/2019  1. Left ventricular ejection fraction, by estimation, is 35 to 40%. The  left ventricle has moderately decreased function. The left ventricle  demonstrates global hypokinesis. Left ventricular diastolic function could  not be evaluated.   2. Right ventricular systolic function is mildly reduced. The right  ventricular size is normal. There is normal pulmonary artery systolic  pressure.   3. Left atrial size was mildly dilated.   4. The mitral valve is normal in structure. Mild mitral valve  regurgitation. No evidence of mitral stenosis.   5. The aortic valve is  tricuspid. There is severe calcifcation of the  aortic valve. There is moderate thickening of the aortic valve. Aortic  valve regurgitation is moderate. Moderate aortic valve stenosis. Aortic  valve area, by VTI measures 0.92 cm.  Aortic valve mean gradient measures 18.0 mmHg. Aortic valve Vmax measures  2.87 m/s. The dimensionless index is low at 0.26 and consistent with more  severe AS. SV index is reduced at 31. The AV mean gradient is likely  underestimated in the setting of LV  dysfunction and this likely represents at least moderate to severe low  flow low gradient aortic stenosis.   6. Aortic dilatation noted. There is borderline dilatation of the aortic  root, measuring 37 mm. There is dilatation of the ascending aorta,  measuring 38 mm.   7. The inferior vena cava is normal in size with greater than 50%  respiratory variability, suggesting right atrial pressure of 3 mmHg.    Cath 02/2018 LEFT HEART CATH AND CORS/GRAFTS ANGIOGRAPHY    Conclusion     Ost RCA to Prox RCA lesion is 100% stenosed.  Origin to Prox Graft lesion is 100% stenosed.  Origin lesion is 100% stenosed.  Prox LAD lesion is 100% stenosed.  Ost 1st Mrg lesion is 90% stenosed.   1. Severe native 3 vessel disease with total occlusion of the RCA and LAD, severe stenosis of the left circumflex 2. S/P remote CABG with continued patency of the LIMA-LAD and SVG-diagonal, total occlusion of the SVG-RCA and SVG-PDA   Recommend medical management of CAD. No clear culprit lesion. Both SVG occlusions appear chronic. No targets for PCI.     Laboratory Data:  High Sensitivity Troponin:   Recent Labs  Lab 03/04/20 0937 03/04/20 1120  TROPONINIHS 344* 760*     Chemistry Recent Labs  Lab 03/04/20 0937  NA 134*  K 3.8  CL 103  CO2 19*  GLUCOSE 116*  BUN 47*  CREATININE 3.29*  CALCIUM 9.0  GFRNONAA 17*  ANIONGAP 12    Recent Labs  Lab 03/04/20 0937  PROT 7.1  ALBUMIN 3.5  AST 28  ALT 15  ALKPHOS 71  BILITOT 0.8   Hematology Recent Labs  Lab 03/04/20 0937  WBC 10.2  RBC 3.71*  HGB 10.6*  HCT 33.5*  MCV 90.3  MCH 28.6  MCHC 31.6  RDW 15.8*  PLT 259   BNP Recent Labs  Lab 03/04/20 0937  BNP 808.0*    Radiology/Studies:  DG Chest Portable 1 View  Result Date: 03/04/2020 CLINICAL DATA:  Shortness of breath Decreased O2 saturation Increased fatigue History of CHF EXAM: PORTABLE CHEST 1 VIEW COMPARISON:  11/11/2019 FINDINGS: Unchanged mild cardiomegaly. Postop changes of CABG again seen. There has been interval worsening of pulmonary vascular congestion. Bilateral airspace opacities have increased since prior exam, more focal in the right lower lobe lung. IMPRESSION: Interval worsening of bilateral lung opacities and pulmonary vascular congestion most consistent with worsening CHF. Given that there is asymmetric increased density in the right lower lung, findings may also be related to concurrent pneumonia. Electronically Signed   By: [name] M.D.   On: 03/04/2020 09:35     Assessment and Plan:   1. Shortness of breath 2. Non-STEMI 3. Acute on chronic combined CHF 4. Moderate to severe aortic stenosis  - SOB initially with activity,  now worsened and occurring with laying down.  He needs to sit upright.  He found hypoxic on arrival.  COVID-negative.  Chest x-ray with vascular congestion and may be component of pneumonia.  BNP 808.  High-sensitivity troponin 344>>760. -Patient reports symptoms similar when admitted with non-STEMI January 2020 but this time it was severe. -His symptoms could be multifactorial from valvular disease, CHF and non-STEMI Patient admits to dietary noncompliance :  Hot dog yesterday; hush puppies   -Continue  IV heparin  -He was given 1 dose of IV Lasix>> see response. Dose as needed (Patient reports stable weight but again, diet changes noted .  5. CKD stage IV - Creatinine 3.29 (Scr ranging from 3-3.7 recently)  6.   Hypothyroidism -On Synthroid at home  -TSH elevated -Per primary team  7. Hypertension -BP  stable  8. CAD s/p CABG - Last cath 02/2018 with continued patency of the LIMA-LAD and SVG-diagonal, total occlusion of the SVG-RCA and SVG-PDA - AS above - Continue ASA and statin  - Not on BB due to prior hx of bradycardia   TIMI Risk Score for Unstable Angina or Non-ST Elevation MI:   The patient's TIMI risk score is 6, which indicates a 41% risk of all cause mortality, new or recurrent myocardial infarction or need for urgent revascularization in the next 14 days.  New York Heart Association (NYHA) Functional Class NYHA Class II       For questions or updates, please contact [facility] Please consult [email or web address] for contact info under    Signed, Leanor Kail, PA  03/04/2020 4:28 PM  Pt seen and exmained  I agree with findings as noted by [name] Bhagat above Pt is a 87 yo with known CAD amd sever AS  Presents with increased SOB and CHF He does admit to some dietary indescetions  at least over the past few days  On exam, he is mildly SOB sitting in bed  Neck with incrased JVP Lungs with mild rales at bases  Cardiac exam   RRR  Gr III/VI systolic murmur LSB   Distant HS Abd is benign   Ext ar without edema  Pt with CHF on exam, most likely due to dietary indscretions in the setting of severe AS and chronic renal dysfunction He  got lasix 40 mg and has not had a signif diuresis  I would not expect it given baseline Cr    I would reocmm increasing to 80 mg  Follow response and dose accordingly Dietary to see pt for education (with family)    2 G Na diet     The pt is not a candidate for intervention   Goals would be for diet education, medicine modification to keep him balanced. Hopefully this will give him some time of comfortable breathing     Dorris Carnes MD

## 2020-03-04 NOTE — ED Triage Notes (Signed)
PT BIB by [facility] after daughter called to report pt having increase sHOB. Per EMS, pt sat at 86% on RA upon arrival. PT c/o increase of fatigue, shob x 6 days.Pt placed on 6 L [place] and pt sat 97%. Pt has hx of CHF, med compliant. DNR at bedside. Pt not on 02 at basline.

## 2020-03-04 NOTE — ED Provider Notes (Signed)
[facility] EMERGENCY DEPARTMENT Provider Note   CSN: [id number] Arrival date & time: 03/04/20  0859     History Chief Complaint  Patient presents with  . Shortness of Breath    Henry [name] is a 87 y.o. male.  HPI Patient presents with shortness of breath.  He has had since yesterday.  Upon arrival patient have sats of 86%.  Fatigue shortness of breath over the last around 6 days but worse since yesterday.  Compliant with medication.  DNR and sees palliative care.  However patient would be admitted to the hospital.  States he does feel as if he is carrying more fluid.  Not having any chest pain.  Patient states he feels much better now that he is on oxygen.  Patient on 6 L of oxygen    Past Medical History:  Diagnosis Date  . Anemia   . CAD of autologous bypass graft   . Carotid artery disease ([place])   . Diabetes mellitus without complication ([place])   . DJD (degenerative joint disease)   . History of anemia of chronic disease   . Hypercholesteremia   . Hypertension   . NSTEMI (non-ST elevated myocardial infarction) ([place])   . PVD (peripheral vascular disease) with claudication ([place])   . Renal insufficiency   . Wears dentures   . Wears glasses     Patient Active Problem List   Diagnosis Date Noted  . Chronic hypoxemic respiratory failure ([place]) 12/03/2019  . Goals of care, counseling/discussion   . Palliative care by specialist   . Encounter for hospice care discussion   . Acute on chronic congestive heart failure ([place]) 11/09/2019  . DNR (do not resuscitate) 11/09/2019  . Mobitz type 1 second degree atrioventricular block 05/02/2018  . Chronic diastolic CHF (congestive heart failure) ([place])   . NSTEMI 02/2018 - 2/4 grafts occluded; Med Rx   . Respiratory failure with hypoxia ([place]) 03/25/2018  . Pulmonary edema 03/25/2018  . Respiratory failure, acute ([place]) 03/25/2018  . Elevated troponin 03/25/2018  . Vitamin D deficiency 01/01/2018  . CKD (chronic  kidney disease) stage 5, GFR less than 15 ml/min (HCC) 07/24/2017  . Carotid artery disease ([place])   . Hypertension   . PVD (peripheral vascular disease) with claudication ([place])   . Peripheral vascular disease with claudication ([place]) 01/22/2017  . Aortic valve stenosis, unspecified etiology 01/22/2017  . Postinflammatory pulmonary fibrosis ([place]) 07/31/2016  . Acute bronchitis 03/07/2016  . Hypothyroidism 08/31/2014  . Carotid stenosis 08/19/2012  . DJD (degenerative joint disease) 08/19/2012  . LBP (low back pain) 08/19/2012  . Cerebral atrophy ([place]) 08/19/2012  . Dizziness 04/13/2012  . Diabetes ([place]) 04/13/2012  . Nausea and vomiting 04/10/2012  . CAD (coronary artery disease) 07/13/2008  . HYPERCHOLESTEROLEMIA 04/04/2007  . Anemia of chronic disease 04/04/2007    Past Surgical History:  Procedure Laterality Date  . A/V FISTULAGRAM N/A 07/15/2018   Procedure: A/V FISTULAGRAM - Left Arm;  Surgeon: Serafina Mitchell, MD;  Location: [facility];  Service: Cardiovascular;  Laterality: N/A;  . AV FISTULA PLACEMENT Left 03/20/2018   Procedure: ARTERIOVENOUS (AV) FISTULA CREATION LEFT ARM;  Surgeon: Serafina Mitchell, MD;  Location: [facility];  Service: Vascular;  Laterality: Left;  . cataract surgery    . COLONOSCOPY     Hx: of  . CORONARY ARTERY BYPASS GRAFT  2000   by Dr. Cyndia Bent  . LEFT HEART CATH AND CORS/GRAFTS ANGIOGRAPHY N/A 03/27/2018   Procedure: LEFT HEART  CATH AND CORS/GRAFTS ANGIOGRAPHY;  Surgeon: Sherren Mocha, MD;  Location: [facility];  Service: Cardiovascular;  Laterality: N/A;  . LUMBAR LAMINECTOMY/DECOMPRESSION MICRODISCECTOMY N/A 09/10/2012   Procedure: LUMBAR DECOMPRESSION,  IN SITU FUSION LUMBAR 4-5;  Surgeon: Melina Schools, MD;  Location: [facility];  Service: Orthopedics;  Laterality: N/A;  . MULTIPLE TOOTH EXTRACTIONS    . TONSILLECTOMY    . UPPER EXTREMITY VENOGRAPHY Left 07/15/2018   Procedure: UPPER EXTREMITY VENOGRAPHY;  Surgeon: Serafina Mitchell, MD;   Location: [facility];  Service: Cardiovascular;  Laterality: Left;  UPPER ARM       Family History  Problem Relation Age of Onset  . Heart disease Mother   . Heart disease Father     Social History   Tobacco Use  . Smoking status: Former Smoker    Packs/day: 1.00    Years: 10.00    Pack years: 10.00    Types: Cigarettes    Quit date: 02/26/1953    Years since quitting: 67.0  . Smokeless tobacco: Never Used  Vaping Use  . Vaping Use: Never used  Substance Use Topics  . Alcohol use: Not Currently    Alcohol/week: 0.0 standard drinks  . Drug use: No    Home Medications Prior to Admission medications   Medication Sig Start Date End Date Taking? Authorizing Provider  amLODipine (NORVASC) 10 MG tablet Take 1 tablet (10 mg total) by mouth every evening. 12/03/19  Yes [name], MD  aspirin EC 81 MG tablet Take 81 mg by mouth daily.    Yes [provider]  atorvastatin (LIPITOR) 40 MG tablet Take 1 tablet (40 mg total) by mouth every evening. 12/03/19  Yes [name], MD  b complex-vitamin c-folic acid (NEPHRO-VITE) 0.8 MG TABS tablet Take 1 tablet by mouth at bedtime.   Yes [provider]  calcitRIOL (ROCALTROL) 0.25 MCG capsule Take 0.25 mcg by mouth once a week. Monday   Yes [provider]  calcium acetate (PHOSLO) 667 MG capsule Take 667 mg by mouth 2 (two) times daily with a meal.   Yes [provider]  Darbepoetin Alfa (ARANESP) 100 MCG/0.5ML SOSY injection Inject 100 mcg into the skin every 30 (thirty) days.    Yes [provider]  diphenoxylate-atropine (LOMOTIL) 2.5-0.025 MG tablet Take 1 tablet by mouth 4 (four) times daily as needed for diarrhea or loose stools. 02/15/20  Yes [name], MD  ferrous gluconate (FERGON) 324 MG tablet Take 324 mg by mouth daily.   Yes [provider]  furosemide (LASIX) 40 MG tablet Take 1 tablet (40 mg total) by mouth every other day. Also take additional dose for weight  greater than 137 lb 11/14/19  Yes Lama, Marge Duncans, MD  levothyroxine (SYNTHROID) 50 MCG tablet Take 50 mcg by mouth daily before breakfast.   Yes [provider]  Multiple Vitamins-Minerals (PRESERVISION AREDS 2 PO) Take 1 tablet by mouth daily.   Yes [provider]  Propylene Glycol (SYSTANE BALANCE) 0.6 % SOLN Place 1 drop into both eyes daily as needed (dry eyes).   Yes [provider]  Lancets (ACCU-CHEK MULTICLIX) lancets USE TWICE DAILY. 03/29/15   Noralee Space, MD  nitroGLYCERIN (NITROSTAT) 0.4 MG SL tablet Place 1 tablet (0.4 mg total) under the tongue every 5 (five) minutes as needed for chest pain. 12/03/19   [name], MD    Allergies    Patient has no known allergies.  Review  of Systems   Review of Systems  Constitutional: Positive for fatigue. Negative for fever.  HENT: Negative for congestion.   Respiratory: Positive for cough and shortness of breath.   Cardiovascular: Negative for chest pain.  Gastrointestinal: Negative for abdominal pain.  Genitourinary: Negative for flank pain.  Musculoskeletal: Negative for back pain.  Skin: Negative for rash.  Neurological: Negative for weakness.  Psychiatric/Behavioral: Negative for confusion.    Physical Exam Updated Vital Signs BP (!) 145/66   Pulse 77   Temp 98.2 F (36.8 C)   Resp (!) 21   Ht 5\' 9"  (1.753 m)   Wt 63 kg   SpO2 98%   BMI 20.53 kg/m   Physical Exam Vitals and nursing note reviewed.  Constitutional:      Appearance: He is well-developed.  HENT:     Head: Normocephalic.  Cardiovascular:     Rate and Rhythm: Regular rhythm.  Pulmonary:     Breath sounds: Rales present.  Chest:     Chest wall: No tenderness.  Abdominal:     Tenderness: There is no abdominal tenderness.  Musculoskeletal:     Cervical back: Neck supple.     Comments: Mild edema bilateral lower extremities.  Skin:    Capillary Refill: Capillary refill takes less than 2 seconds.  Neurological:      Mental Status: He is alert and oriented to person, place, and time.     ED Results / Procedures / Treatments   Labs (all labs ordered are listed, but only abnormal results are displayed) Labs Reviewed  COMPREHENSIVE METABOLIC PANEL - Abnormal; Notable for the following components:      Result Value   Sodium 134 (*)    CO2 19 (*)    Glucose, Bld 116 (*)    BUN 47 (*)    Creatinine, Ser 3.29 (*)    GFR, Estimated 17 (*)    All other components within normal limits  BRAIN NATRIURETIC PEPTIDE - Abnormal; Notable for the following components:   B Natriuretic Peptide 808.0 (*)    All other components within normal limits  CBC WITH DIFFERENTIAL/PLATELET - Abnormal; Notable for the following components:   RBC 3.71 (*)    Hemoglobin 10.6 (*)    HCT 33.5 (*)    RDW 15.8 (*)    Monocytes Absolute 1.2 (*)    All other components within normal limits  TSH - Abnormal; Notable for the following components:   TSH 6.507 (*)    All other components within normal limits  TROPONIN I (HIGH SENSITIVITY) - Abnormal; Notable for the following components:   Troponin I (High Sensitivity) 344 (*)    All other components within normal limits  RESP PANEL BY RT-PCR (FLU A&B, COVID) ARPGX2  TROPONIN I (HIGH SENSITIVITY)    EKG EKG Interpretation  Date/Time:  Friday March 04 2020 09:05:49 EST Ventricular Rate:  84 PR Interval:    QRS Duration: 107 QT Interval:  328 QTC Calculation: 388 R Axis:   43 Text Interpretation: Sinus or ectopic atrial rhythm Prolonged PR interval Incomplete left bundle branch block LVH with secondary repolarization abnormality Confirmed by Davonna Belling [phone]) on 03/04/2020 9:11:50 AM   Radiology DG Chest Portable 1 View  Result Date: 03/04/2020 CLINICAL DATA:  Shortness of breath Decreased O2 saturation Increased fatigue History of CHF EXAM: PORTABLE CHEST 1 VIEW COMPARISON:  11/11/2019 FINDINGS: Unchanged mild cardiomegaly. Postop changes of CABG again seen. There  has been interval worsening of pulmonary vascular congestion. Bilateral airspace  opacities have increased since prior exam, more focal in the right lower lobe lung. IMPRESSION: Interval worsening of bilateral lung opacities and pulmonary vascular congestion most consistent with worsening CHF. Given that there is asymmetric increased density in the right lower lung, findings may also be related to concurrent pneumonia. Electronically Signed   By: [name] M.D.   On: 03/04/2020 09:35    Procedures Procedures (including critical care time)  Medications Ordered in ED Medications  furosemide (LASIX) injection 40 mg (has no administration in time range)    ED Course  I have reviewed the triage vital signs and the nursing notes.  Pertinent labs & imaging results that were available during my care of the patient were reviewed by me and considered in my medical decision making (see chart for details).    MDM Rules/Calculators/A&P                          Patient shortness of breath.  Appears to be volume overloaded.  History of pneumonia.  I think most likely CHF as opposed to a pneumonia.  BNP is up.  TSH is also elevated however.  Much improved on oxygen.  We will give some IV Lasix.  Patient is DNR and does see hospice but appears more as a palliative care.  Discussed with patient and he is up for admission to the hospital if he feels better.  I think he would be a good candidate for this.  Will discuss with hospitalist.  CRITICAL CARE Performed by: Davonna Belling Total critical care time: 30 minutes Critical care time was exclusive of separately billable procedures and treating other patients. Critical care was necessary to treat or prevent imminent or life-threatening deterioration. Critical care was time spent personally by me on the following activities: development of treatment plan with patient and/or surrogate as well as nursing, discussions with consultants, evaluation of patient's  response to treatment, examination of patient, obtaining history from patient or surrogate, ordering and performing treatments and interventions, ordering and review of laboratory studies, ordering and review of radiographic studies, pulse oximetry and re-evaluation of patient's condition.  Final Clinical Impression(s) / ED Diagnoses Final diagnoses:  Acute on chronic congestive heart failure, unspecified heart failure type [facility])    Rx / DC Orders ED Discharge Orders    None       Davonna Belling, MD 03/04/20 1114

## 2020-03-04 NOTE — Progress Notes (Signed)
[facility] ([facility]) Community Based Palliative Care       This patient is enrolled in our palliative care services in the community.  [facility] will continue to follow for any discharge planning needs and to coordinate continuation of palliative care.   If you have questions or need assistance, please call [phone] or contact the hospital Liaison listed on AMION.     Thank you for the opportunity to participate in this patient's care.     Chrislyn King, BSN, RN [facility] Hospital Liaison   [phone]   

## 2020-03-04 NOTE — ED Notes (Signed)
Patient denies any pain at this time   Currently on 6L humified air. Baseline is RA.  Daughter at bedside.

## 2020-03-04 NOTE — ED Notes (Signed)
Patient would like to reinstate DNR-- MD Tamala Julian made aware.

## 2020-03-04 NOTE — Progress Notes (Signed)
[facility] for IV heparin Indication: chest pain/ACS  No Known Allergies  Patient Measurements: Height: 5\' 9"  (175.3 cm) Weight: 63 kg (139 lb) IBW/kg (Calculated) : 70.7 Heparin Dosing Weight: 63 kg  Vital Signs: Temp: 97 F (36.1 C) (01/07 2000) Temp Source: Oral (01/07 2000) BP: 163/71 (01/07 1809) Pulse Rate: 93 (01/07 2000)  Labs: Recent Labs    03/04/20 0937 03/04/20 1120 03/04/20 2307  HGB 10.6*  --   --   HCT 33.5*  --   --   PLT 259  --   --   HEPARINUNFRC  --   --  0.52  CREATININE 3.29*  --   --   TROPONINIHS 344* 760*  --     Estimated Creatinine Clearance: 12.5 mL/min (A) (by C-G formula based on SCr of 3.29 mg/dL (H)).   Assessment: 87 year old male admitted for shortness of breath, pharmacy consulted to start IV heparin for ACS/STEMI. Troponin trending up from 344 to 760. Hgb stable at 10.6 and platelets wnl.  Heparin level therapeutic (0.52) on gtt at 750 units/hr. No bleeding noted.  Goal of Therapy:  Heparin level 0.3-0.7 units/ml Monitor platelets by anticoagulation protocol: Yes   Plan:  Continue heparin at 750 units/hr Daily heparin level, CBC  Sherlon Handing, PharmD, BCPS Please see amion for complete clinical pharmacist phone list 03/04/2020 11:57 PM

## 2020-03-04 NOTE — Progress Notes (Signed)
ANTICOAGULATION CONSULT NOTE - Initial Consult  Pharmacy Consult for IV heparin Indication: chest pain/ACS  No Known Allergies  Patient Measurements: Height: 5\' 9"  (175.3 cm) Weight: 63 kg (139 lb) IBW/kg (Calculated) : 70.7 Heparin Dosing Weight: 63 kg  Vital Signs: Temp: 98.2 F (36.8 C) (01/07 0907) BP: 126/78 (01/07 1439) Pulse Rate: 98 (01/07 1439)  Labs: Recent Labs    03/04/20 0937 03/04/20 1120  HGB 10.6*  --   HCT 33.5*  --   PLT 259  --   CREATININE 3.29*  --   TROPONINIHS 344* 760*    Estimated Creatinine Clearance: 12.5 mL/min (A) (by C-G formula based on SCr of 3.29 mg/dL (H)).   Medical History: Past Medical History:  Diagnosis Date  . Anemia   . CAD of autologous bypass graft   . Carotid artery disease ([place])   . Diabetes mellitus without complication ([place])   . DJD (degenerative joint disease)   . History of anemia of chronic disease   . Hypercholesteremia   . Hypertension   . NSTEMI (non-ST elevated myocardial infarction) ([place])   . PVD (peripheral vascular disease) with claudication ([place])   . Renal insufficiency   . Wears dentures   . Wears glasses     Medications:  Infusions:  . sodium chloride    . heparin      Assessment: 87 year old male admitted for shortness of breath, pharmacy consulted to start IV heparin for ACS/STEMI. Troponin trending up from 344 to 760. Hgb stable at 10.6 and platelets wnl.  Given patient received subcutaneous heparin 5,000 units at 1330, will give lower IV heparin bolus now.  Goal of Therapy:  Heparin level 0.3-0.7 units/ml Monitor platelets by anticoagulation protocol: Yes   Plan:  Give 2500 units bolus x 1 Start heparin infusion at 750 units/hr  Heparin level in 8 hours Daily heparin level, CBC Monitor for s/sx bleeding  Fara Olden, PharmD PGY-1 Pharmacy Resident 03/04/2020 2:54 PM Please see AMION for all pharmacy numbers

## 2020-03-04 NOTE — Evaluation (Signed)
PT Cancellation Note  Patient Details Name: Henry [name] MRN: [id number] DOB: [birth year]   Cancelled Treatment:    Reason Eval/Treat Not Completed: Medical issues which prohibited therapy per RN, patient still very dyspneic and not medically ready. Will follow and return when able.    Windell Norfolk, DPT, PN1   Supplemental Physical Therapist [facility]    Pager [phone] Acute Rehab Office [phone]

## 2020-03-04 NOTE — H&P (Addendum)
History and Physical    JALIN [name] MCN:[id number] DOB: 18-Oct-1924 DOA: 03/04/2020  Referring MD/NP/PA: Davonna Belling, MD PCP: [name], MD  Consultants: Sherren Mocha, MD (cardiology)  Hortense Ramal, MD(nephrology) Patient coming from: Home, lives alone   Chief Complaint: Could not breathe  I have personally briefly reviewed patient's old medical records in [place]   HPI: Henry [name] is a 87 y.o. male with medical history significant of combined systolic and diastolic CHF last EF 35- 62%, moderate to severe AR, CAD s/p CABG, PAD, HTN, HLD, DM type II, CKD stage IV presents with complaints of being unable to unable to breathe over the last week, but acutely worse over the last 2 days.  Reports that with any kind of activity he has been getting more short of breath and has been very  fatigue.  Complains of having chest pressure feeling that makes it difficult for him to take a deep breathe in.  He has been taking Lasix as prescribed and still is able to make urine.  Normally he is not on oxygen at baseline.  Patient has a DNR on arrival, but states that he would like to be a full code.  patient reports that his wife is in the memory care unit and declining and he would like to be the one to bury her if possible.    ED Course: Upon admission into the emergency department patient was seen to be afebrile, respirations 18-22, O2 saturations initially as low as 86% on room air with improvement on 6 L nasal cannula oxygen, and all other vital signs stable.  Labs significant for hemoglobin 10.6, sodium 134, BUN 47, creatinine 3.29, BNP 808, troponin 344.  Chest x-ray noted interval worsening of bilateral interstitial opacities concerning for CHF exacerbation.  Patient had been given 40 mg of Lasix IV.  TRH called to admit.  Review of Systems  Constitutional: Positive for malaise/fatigue. Negative for fever.  HENT: Negative for congestion and nosebleeds.   Eyes: Negative  for photophobia and pain.  Respiratory: Positive for shortness of breath.   Cardiovascular: Negative for leg swelling.       Reports some chest pressure  Gastrointestinal: Negative for abdominal pain, nausea and vomiting.  Genitourinary: Negative for dysuria and hematuria.  Neurological: Positive for weakness.  Psychiatric/Behavioral: Negative for substance abuse. The patient has insomnia.     Past Medical History:  Diagnosis Date  . Anemia   . CAD of autologous bypass graft   . Carotid artery disease ([place])   . Diabetes mellitus without complication ([place])   . DJD (degenerative joint disease)   . History of anemia of chronic disease   . Hypercholesteremia   . Hypertension   . NSTEMI (non-ST elevated myocardial infarction) ([place])   . PVD (peripheral vascular disease) with claudication ([place])   . Renal insufficiency   . Wears dentures   . Wears glasses     Past Surgical History:  Procedure Laterality Date  . A/V FISTULAGRAM N/A 07/15/2018   Procedure: A/V FISTULAGRAM - Left Arm;  Surgeon: Serafina Mitchell, MD;  Location: [facility];  Service: Cardiovascular;  Laterality: N/A;  . AV FISTULA PLACEMENT Left 03/20/2018   Procedure: ARTERIOVENOUS (AV) FISTULA CREATION LEFT ARM;  Surgeon: Serafina Mitchell, MD;  Location: [facility];  Service: Vascular;  Laterality: Left;  . cataract surgery    . COLONOSCOPY     Hx: of  . CORONARY ARTERY BYPASS GRAFT  2000  by Dr. Cyndia Bent  . LEFT HEART CATH AND CORS/GRAFTS ANGIOGRAPHY N/A 03/27/2018   Procedure: LEFT HEART CATH AND CORS/GRAFTS ANGIOGRAPHY;  Surgeon: Sherren Mocha, MD;  Location: [facility];  Service: Cardiovascular;  Laterality: N/A;  . LUMBAR LAMINECTOMY/DECOMPRESSION MICRODISCECTOMY N/A 09/10/2012   Procedure: LUMBAR DECOMPRESSION,  IN SITU FUSION LUMBAR 4-5;  Surgeon: Melina Schools, MD;  Location: [facility];  Service: Orthopedics;  Laterality: N/A;  . MULTIPLE TOOTH EXTRACTIONS    . TONSILLECTOMY    . UPPER EXTREMITY  VENOGRAPHY Left 07/15/2018   Procedure: UPPER EXTREMITY VENOGRAPHY;  Surgeon: Serafina Mitchell, MD;  Location: [facility];  Service: Cardiovascular;  Laterality: Left;  UPPER ARM     reports that he quit smoking about 67 years ago. His smoking use included cigarettes. He has a 10.00 pack-year smoking history. He has never used smokeless tobacco. He reports previous alcohol use. He reports that he does not use drugs.  No Known Allergies  Family History  Problem Relation Age of Onset  . Heart disease Mother   . Heart disease Father     Prior to Admission medications   Medication Sig Start Date End Date Taking? Authorizing Provider  amLODipine (NORVASC) 10 MG tablet Take 1 tablet (10 mg total) by mouth every evening. 12/03/19  Yes [name], MD  aspirin EC 81 MG tablet Take 81 mg by mouth daily.    Yes [provider]  atorvastatin (LIPITOR) 40 MG tablet Take 1 tablet (40 mg total) by mouth every evening. 12/03/19  Yes [name], MD  b complex-vitamin c-folic acid (NEPHRO-VITE) 0.8 MG TABS tablet Take 1 tablet by mouth at bedtime.   Yes [provider]  calcitRIOL (ROCALTROL) 0.25 MCG capsule Take 0.25 mcg by mouth once a week. Monday   Yes [provider]  calcium acetate (PHOSLO) 667 MG capsule Take 667 mg by mouth 2 (two) times daily with a meal.   Yes [provider]  Darbepoetin Alfa (ARANESP) 100 MCG/0.5ML SOSY injection Inject 100 mcg into the skin every 30 (thirty) days.    Yes [provider]  diphenoxylate-atropine (LOMOTIL) 2.5-0.025 MG tablet Take 1 tablet by mouth 4 (four) times daily as needed for diarrhea or loose stools. 02/15/20  Yes [name], MD  ferrous gluconate (FERGON) 324 MG tablet Take 324 mg by mouth daily.   Yes [provider]  furosemide (LASIX) 40 MG tablet Take 1 tablet (40 mg total) by mouth every other day. Also take additional dose for weight greater than 137 lb 11/14/19  Yes Lama, Marge Duncans,  MD  levothyroxine (SYNTHROID) 50 MCG tablet Take 50 mcg by mouth daily before breakfast.   Yes [provider]  Multiple Vitamins-Minerals (PRESERVISION AREDS 2 PO) Take 1 tablet by mouth daily.   Yes [provider]  Propylene Glycol (SYSTANE BALANCE) 0.6 % SOLN Place 1 drop into both eyes daily as needed (dry eyes).   Yes [provider]  Lancets (ACCU-CHEK MULTICLIX) lancets USE TWICE DAILY. 03/29/15   Noralee Space, MD  nitroGLYCERIN (NITROSTAT) 0.4 MG SL tablet Place 1 tablet (0.4 mg total) under the tongue every 5 (five) minutes as needed for chest pain. 12/03/19   [name], MD    Physical Exam:  Constitutional: Elderly male who appears to be in some distress Vitals:   03/04/20 0935 03/04/20 1000 03/04/20 1030 03/04/20 1100  BP: (!) 152/68 (!) 148/66 (!) 155/63 (!) 145/66  Pulse: 85 83  83 77  Resp: (!) 21 (!) 21 18 (!) 21  Temp:      SpO2: 100% 99% 97% 98%  Weight:      Height:       Eyes: PERRL, lids and conjunctivae normal ENMT: Mucous membranes are moist. Posterior pharynx clear of any exudate or lesions.  Neck: normal, supple, no masses, no thyromegaly.  JVD appreciated. Respiratory: Decreased overall aeration with positive crackles appreciated in both lung.  Patient currently on 6 L nasal cannula oxygen. Cardiovascular: Regular rate and rhythm, no murmurs / rubs / gallops. No extremity edema. 2+ pedal pulses. No carotid bruits.  Abdomen: no tenderness, no masses palpated. No hepatosplenomegaly. Bowel sounds positive.  Musculoskeletal: no clubbing / cyanosis. No joint deformity upper and lower extremities. Good ROM, no contractures. Normal muscle tone.  Skin: no rashes, lesions, ulcers. No induration Neurologic: CN 2-12 grossly intact. Sensation intact, DTR normal. Strength 5/5 in all 4.  Psychiatric: Normal judgment and insight. Alert and oriented x 3. Normal mood.     Labs on Admission: I have personally reviewed following labs and imaging  studies  CBC: Recent Labs  Lab 03/04/20 0937  WBC 10.2  NEUTROABS 7.7  HGB 10.6*  HCT 33.5*  MCV 90.3  PLT 161   Basic Metabolic Panel: Recent Labs  Lab 03/04/20 0937  NA 134*  K 3.8  CL 103  CO2 19*  GLUCOSE 116*  BUN 47*  CREATININE 3.29*  CALCIUM 9.0   GFR: Estimated Creatinine Clearance: 12.5 mL/min (A) (by C-G formula based on SCr of 3.29 mg/dL (H)). Liver Function Tests: Recent Labs  Lab 03/04/20 0937  AST 28  ALT 15  ALKPHOS 71  BILITOT 0.8  PROT 7.1  ALBUMIN 3.5   No results for input(s): LIPASE, AMYLASE in the last 168 hours. No results for input(s): AMMONIA in the last 168 hours. Coagulation Profile: No results for input(s): INR, PROTIME in the last 168 hours. Cardiac Enzymes: No results for input(s): CKTOTAL, CKMB, CKMBINDEX, TROPONINI in the last 168 hours. BNP (last 3 results) No results for input(s): PROBNP in the last 8760 hours. HbA1C: No results for input(s): HGBA1C in the last 72 hours. CBG: No results for input(s): GLUCAP in the last 168 hours. Lipid Profile: No results for input(s): CHOL, HDL, LDLCALC, TRIG, CHOLHDL, LDLDIRECT in the last 72 hours. Thyroid Function Tests: Recent Labs    03/04/20 0937  TSH 6.507*   Anemia Panel: No results for input(s): VITAMINB12, FOLATE, FERRITIN, TIBC, IRON, RETICCTPCT in the last 72 hours. Urine analysis:    Component Value Date/Time   COLORURINE YELLOW 02/12/2019 1215   APPEARANCEUR CLEAR 02/12/2019 1215   LABSPEC 1.015 02/12/2019 1215   PHURINE 6.5 02/12/2019 1215   GLUCOSEU NEGATIVE 02/12/2019 1215   HGBUR NEGATIVE 02/12/2019 1215   [place] 02/12/2019 1215   KETONESUR NEGATIVE 02/12/2019 1215   PROTEINUR 100 (A) 04/10/2012 1929   UROBILINOGEN 0.2 02/12/2019 1215   NITRITE NEGATIVE 02/12/2019 1215   LEUKOCYTESUR NEGATIVE 02/12/2019 1215   Sepsis Labs: Recent Results (from the past 240 hour(s))  Resp Panel by RT-PCR (Flu A&B, Covid) Nasopharyngeal Swab     Status: None    Collection Time: 03/04/20  9:37 AM   Specimen: Nasopharyngeal Swab; Nasopharyngeal(NP) swabs in vial transport medium  Result Value Ref Range Status   SARS Coronavirus 2 by RT PCR NEGATIVE NEGATIVE Final    Comment: (NOTE) SARS-CoV-2 target nucleic acids are NOT DETECTED.  The SARS-CoV-2 RNA is generally detectable in upper  respiratory specimens during the acute phase of infection. The lowest concentration of SARS-CoV-2 viral copies this assay can detect is 138 copies/mL. A negative result does not preclude SARS-Cov-2 infection and should not be used as the sole basis for treatment or other patient management decisions. A negative result may occur with  improper specimen collection/handling, submission of specimen other than nasopharyngeal swab, presence of viral mutation(s) within the areas targeted by this assay, and inadequate number of viral copies(<138 copies/mL). A negative result must be combined with clinical observations, patient history, and epidemiological information. The expected result is Negative.  Fact Sheet for Patients:  EntrepreneurPulse.com.au  Fact Sheet for Healthcare Providers:  IncredibleEmployment.be  This test is no t yet approved or cleared by the [place] FDA and  has been authorized for detection and/or diagnosis of SARS-CoV-2 by FDA under an Emergency Use Authorization (EUA). This EUA will remain  in effect (meaning this test can be used) for the duration of the COVID-19 declaration under Section 564(b)(1) of the Act, 21 U.S.C.section 360bbb-3(b)(1), unless the authorization is terminated  or revoked sooner.       Influenza A by PCR NEGATIVE NEGATIVE Final   Influenza B by PCR NEGATIVE NEGATIVE Final    Comment: (NOTE) The Xpert Xpress SARS-CoV-2/FLU/RSV plus assay is intended as an aid in the diagnosis of influenza from Nasopharyngeal swab specimens and should not be used as a sole basis for treatment.  Nasal washings and aspirates are unacceptable for Xpert Xpress SARS-CoV-2/FLU/RSV testing.  Fact Sheet for Patients: EntrepreneurPulse.com.au  Fact Sheet for Healthcare Providers: IncredibleEmployment.be  This test is not yet approved or cleared by the [place] FDA and has been authorized for detection and/or diagnosis of SARS-CoV-2 by FDA under an Emergency Use Authorization (EUA). This EUA will remain in effect (meaning this test can be used) for the duration of the COVID-19 declaration under Section 564(b)(1) of the Act, 21 U.S.C. section 360bbb-3(b)(1), unless the authorization is terminated or revoked.  Performed at [facility], [place] [address], [place], [place] [address]      Radiological Exams on Admission: DG Chest Portable 1 View  Result Date: 03/04/2020 CLINICAL DATA:  Shortness of breath Decreased O2 saturation Increased fatigue History of CHF EXAM: PORTABLE CHEST 1 VIEW COMPARISON:  11/11/2019 FINDINGS: Unchanged mild cardiomegaly. Postop changes of CABG again seen. There has been interval worsening of pulmonary vascular congestion. Bilateral airspace opacities have increased since prior exam, more focal in the right lower lobe lung. IMPRESSION: Interval worsening of bilateral lung opacities and pulmonary vascular congestion most consistent with worsening CHF. Given that there is asymmetric increased density in the right lower lung, findings may also be related to concurrent pneumonia. Electronically Signed   By: [name] M.D.   On: 03/04/2020 09:35    EKG: Independently reviewed.  Sinus rhythm 84 bpm with a incomplete left bundle branch block  Assessment/Plan Acute respiratory failure secondary to systolic congestive heart failure: Acute on chronic.  Patient presented with shortness of breath.  Found to be hypoxic down to 86% on room air.  He has significant crackles on physical exam and currently O2 saturations maintained  on 6 L nasal cannula oxygen.  Chest x-ray showing interstitial opacities suggestive of CHF.  BNP 808. Last EF was 35-40% in 10/2019, but was 50-55% in 05/2019. Home regimen included furosemide 40 mg every other day.  Patient was given 40 mg of Lasix IV while in the emergency department. -Admit to a telemetry bed -Heart failure  orders set  initiated  -Continuous pulse oximetry with nasal cannula oxygen as needed to keep O2 saturations >92% -Strict I&Os and daily weights -Elevate lower extremities -Lasix 40 mg IV Bid -Reassess in a.m. and adjust diuresis as needed. -Check echocardiogram -Cardiology consulted, will follow-up for further recommended  Elevated troponin coronary artery disease: Acute on chronic.  Patient reports having chest pressure, but does not necessarily report pain.  High-sensitivity troponin 344-> 716.  Remote history of CABG.  Patient with severe native three-vessel disease with total occlusion of the RCA and LAD with severe stenosis of the left circumflex based on left heart cath from 03/27/2018.  At that time medical management has been recommended.  Given patient's poor kidney function and comorbidities no intervention likely to be recommended -Heparin drip per pharmacy for unless cardiology deems not necessary -Continue aspirin  CKD stage IV/V: 3.29 with BUN 47 on admission.  Baseline previously noted to be around 3.1.  At this time he is still able to make urine.  Follows in the outpatient setting by Dr. Marval Regal of nephrology.  -Consider formally consulting nephrology if signs of worsening kidney function with diuresis  Essential hypertension: Blood pressure is elevated up to 158/71.  -Continue amlodipine 10 mg every evening as tolerated  Anemia of chronic disease: Stable on admission hemoglobin 10.6 g/dL.  Patient has been receiving darbepoetin alpha every 30 days and is on ferrous gluconate 324 mg daily. -Recheck CBC in a.m.  Hypothyroidism: On admission TSH  6.507. -Continue levothyroxine  Hyperlipidemia: Home medication regimen includes atorvastatin 40 mg daily. -Continue statin  Diabetes mellitus type 2, controlled: Last hemoglobin A1c 5.7 on 12/03/2019.  Patient not on any diabetic medications. -Continue to monitor  DNR:  Patient was a DNR on admission.  However, patient would like to rescind the DNR as he reports that his wife is in the memory care unit and not doing well.  He would like to be the one to bury her if he can.  Discussed with the patient that his heart were to stop we would likely still not be able to get him back even if attempted. After further discussions patient wanted DNR back in place.   DVT prophylaxis: heparin  Code Status: DNR Family Communication: Daughter updated over the phone Disposition Plan: To be determined Consults called: Cardiology Admission status: Inpatient  Norval Morton MD [facility]   If 7PM-7AM, please contact night-coverage   03/04/2020, 11:29 AM

## 2020-03-05 ENCOUNTER — Inpatient Hospital Stay (HOSPITAL_COMMUNITY): Payer: Medicare Other

## 2020-03-05 DIAGNOSIS — I5021 Acute systolic (congestive) heart failure: Secondary | ICD-10-CM | POA: Diagnosis not present

## 2020-03-05 DIAGNOSIS — I5020 Unspecified systolic (congestive) heart failure: Secondary | ICD-10-CM | POA: Diagnosis not present

## 2020-03-05 DIAGNOSIS — I2510 Atherosclerotic heart disease of native coronary artery without angina pectoris: Secondary | ICD-10-CM | POA: Diagnosis not present

## 2020-03-05 DIAGNOSIS — E039 Hypothyroidism, unspecified: Secondary | ICD-10-CM | POA: Diagnosis not present

## 2020-03-05 DIAGNOSIS — I10 Essential (primary) hypertension: Secondary | ICD-10-CM | POA: Diagnosis not present

## 2020-03-05 LAB — GLUCOSE, CAPILLARY
Glucose-Capillary: 115 mg/dL — ABNORMAL HIGH (ref 70–99)
Glucose-Capillary: 133 mg/dL — ABNORMAL HIGH (ref 70–99)
Glucose-Capillary: 124 mg/dL — ABNORMAL HIGH (ref 70–99)
Glucose-Capillary: 128 mg/dL — ABNORMAL HIGH (ref 70–99)

## 2020-03-05 LAB — CBC
HCT: 27 % — ABNORMAL LOW (ref 39.0–52.0)
Hemoglobin: 9.1 g/dL — ABNORMAL LOW (ref 13.0–17.0)
MCH: 29.3 pg (ref 26.0–34.0)
MCHC: 33.7 g/dL (ref 30.0–36.0)
MCV: 86.8 fL (ref 80.0–100.0)
Platelets: 265 10*3/uL (ref 150–400)
RBC: 3.11 MIL/uL — ABNORMAL LOW (ref 4.22–5.81)
RDW: 15.5 % (ref 11.5–15.5)
WBC: 9.1 10*3/uL (ref 4.0–10.5)
nRBC: 0 % (ref 0.0–0.2)

## 2020-03-05 LAB — BASIC METABOLIC PANEL
Anion gap: 12 (ref 5–15)
BUN: 50 mg/dL — ABNORMAL HIGH (ref 8–23)
CO2: 21 mmol/L — ABNORMAL LOW (ref 22–32)
Calcium: 8.8 mg/dL — ABNORMAL LOW (ref 8.9–10.3)
Chloride: 103 mmol/L (ref 98–111)
Creatinine, Ser: 3.37 mg/dL — ABNORMAL HIGH (ref 0.61–1.24)
GFR, Estimated: 16 mL/min — ABNORMAL LOW (ref >60)
Glucose, Bld: 127 mg/dL — ABNORMAL HIGH (ref 70–99)
Potassium: 3.5 mmol/L (ref 3.5–5.1)
Sodium: 136 mmol/L (ref 135–145)

## 2020-03-05 LAB — HEMOGLOBIN A1C
Hgb A1c MFr Bld: 5.6 % (ref 4.8–5.6)
Mean Plasma Glucose: 114.02 mg/dL

## 2020-03-05 LAB — ECHOCARDIOGRAM COMPLETE
AR max vel: 0.84 cm2
AV Area VTI: 0.76 cm2
AV Area mean vel: 0.75 cm2
AV Mean grad: 17 mmHg
AV Peak grad: 28.6 mmHg
Ao pk vel: 2.68 m/s
Area-P 1/2: 5.97 cm2
Height: 69 in
P 1/2 time: 240 msec
S' Lateral: 3.9 cm
Weight: 2338.64 oz

## 2020-03-05 LAB — HEPARIN LEVEL (UNFRACTIONATED): Heparin Unfractionated: 0.34 IU/mL (ref 0.30–0.70)

## 2020-03-05 NOTE — Progress Notes (Signed)
Occupational Therapy Evaluation Patient Details Name: Henry [name] MRN: [id number] DOB: [birth year] Today's Date: 03/05/2020    History of Present Illness Henry [name] is a 87 y.o. male with a hx of CAD status post CABG with graft failure, moderate to severe aortic stenosis, not a candidate for TAVR, chronic combined CHF, peripheral arterial disease, chronic kidney disease stage IV with failed dialysis access, hypertension, diabetes mellitus, hyperlipidemia second-degree type I AV block, and orthostatic intolerance who is being seen today for the evaluation of CHF   Clinical Impression   Pt seen with daughter in room, endorses living alone, daughters assisting with IADL's as needed, using quad cane for mobility, and being mod indep with use of DME for ADL's. Reports being 'slow but safe'. Currently pt presents with significantly decreased activity tolerance and decreased O2 sats with all components of assessment this date, requiring at least min-mod A for repositioning and transition to and from EOB. Unable to safely transition to standing d/t reports of dizziness and fatigue despite BP being steady. Pt with self reports of PRE scale of 8 with activity. O dropped briefly to 85% with transition to<>from EOB requiring A for repositioning and guidance through breathing techniques to increase >90%. OT attempted to educate pt and daughter on levels of S/A required at baseline and benefit of post acute OT, pt and dtr preference is for home with [facility]. OT will continued to follow acutely, to maximize activity tolerance, safety and indep.     Follow Up Recommendations  Supervision/Assistance - 24 hour;SNF;Home health OT    Equipment Recommendations  Other (comment) (TBD with further participation, appears to have majority of equip needed at this time)    Recommendations for Other Services Rehab consult    Precautions / Restrictions Precautions Precautions: Fall Precaution Comments: 5L  O2 Restrictions Weight Bearing Restrictions: No      Mobility Bed Mobility Overal bed mobility: Needs Assistance Bed Mobility: Supine to Sit;Sit to Supine     Supine to sit: Mod assist;HOB elevated Sit to supine: Min assist (increased time)   General bed mobility comments: increased time and reliance on bed for all repositioning and bed level transfer this date, decreased tolerance for activity.    Transfers Overall transfer level: Needs assistance   Transfers: Sit to/from Stand Sit to Stand: Mod assist         General transfer comment: deferred transition to standing this date d/t reports of SOB and monitoring of O2 at EOB on 5L/    Balance Overall balance assessment: Needs assistance Sitting-balance support: Feet supported Sitting balance-Leahy Scale: Fair     Standing balance support: Bilateral upper extremity supported Standing balance-Leahy Scale: Poor      ADL either performed or assessed with clinical judgement   ADL Overall ADL's : Needs assistance/impaired Eating/Feeding: Set up (increased time)   Grooming: Set up           Upper Body Dressing : Minimal assistance (bed level to donn/doff gown)   Lower Body Dressing: Moderate assistance;Sitting/lateral leans (EOB) General ADL Comments: full ADL assessment limited to continued desats with increased time for recovery during seated activities and transitions. currently demo's need for increased A for all ADL's. pt with SOB present just talking with therapist and daughter in room     Vision Baseline Vision/History: Wears glasses Wears Glasses: Reading only Vision Assessment?: No apparent visual deficits     Perception     Praxis      Pertinent Vitals/Pain Pain Assessment: No/denies  pain     Hand Dominance     Extremity/Trunk Assessment Upper Extremity Assessment Upper Extremity Assessment: Overall WFL for tasks assessed   Lower Extremity Assessment Lower Extremity Assessment: Defer to PT  evaluation   Cervical / Trunk Assessment Cervical / Trunk Assessment: Kyphotic   Communication     Cognition Arousal/Alertness: Awake/alert Behavior During Therapy: WFL for tasks assessed/performed Overall Cognitive Status: Within Functional Limits for tasks assessed     General Comments  on 4L on arival to room; O2 sats 91%, sustained between 89-91% during conversation, and dropping to 84% with tansfers and repositioning at bed level. CNA alerted that was outside of room, and reports pt shoulder be on 5L, Ot increased O2 support. improved sats to >=90% in ~1 minute BP: supine 143/65 HR 92 BP sitting 141/66 HR 110    Exercises     Shoulder Instructions      Home Living Family/patient expects to be discharged to:: Private residence Living Arrangements: Alone Available Help at Discharge: Family;Available PRN/intermittently Type of Home: House Home Access: Stairs to enter [employer] of Steps: 2 Entrance Stairs-Rails: None Home Layout: Two level;Able to live on main level with bedroom/bathroom (stair lift to access upper leve)     Bathroom Shower/Tub: Tub/shower unit   Bathroom Toilet: Standard     Home Equipment: Cane - quad;Tub bench;Grab bars - tub/shower;Walker - 4 wheels   Additional Comments: appears as though recently wife has transitioned to memory care unit, family A with pt's bills and grocery/errands, but pt complete basic ADL's and mobility in home at indep level.      Prior Functioning/Environment Level of Independence: Independent with assistive device(s)                 OT Problem List: Decreased strength;Decreased activity tolerance;Impaired balance (sitting and/or standing);Decreased safety awareness;Decreased knowledge of precautions;Cardiopulmonary status limiting activity      OT Treatment/Interventions: Self-care/ADL training;Therapeutic exercise;Energy conservation;Therapeutic activities;Balance training;Patient/family education;DME  and/or AE instruction   Session with focus on pursed lip breathing, in and out through nose. Introduced basic concepts of ECT's to pt and daughter, as well as guided pt through repositioning and bed level activity. Discussed status of pt with CNA as during session O2 dropped with transition and repositioning in bed to 85% requiring >1 minute for recovery.    OT Goals(Current goals can be found in the care plan section) Acute Rehab OT Goals Patient Stated Goal: I Want to go home when I feel better OT Goal Formulation: With patient Time For Goal Achievement: 03/19/20 Potential to Achieve Goals: Fair ADL Goals Pt Will Perform Upper Body Dressing: with modified independence Pt Will Perform Lower Body Dressing: with modified independence  OT Frequency: Min 2X/week   Barriers to D/C:    lives alone at baseline, family appears to be supportive       Co-evaluation              AM-PAC OT "6 Clicks" Daily Activity     Outcome Measure Help from another person eating meals?: A Little Help from another person taking care of personal grooming?: A Little Help from another person toileting, which includes using toliet, bedpan, or urinal?: Total Help from another person bathing (including washing, rinsing, drying)?: A Lot Help from another person to put on and taking off regular upper body clothing?: A Little Help from another person to put on and taking off regular lower body clothing?: A Lot 6 Click Score: 14   End of Session  Nurse Communication:  (CNA alerted)  Activity Tolerance: Patient limited by fatigue Patient left: in bed;with call bell/phone within reach;with bed alarm set;with family/visitor present  OT Visit Diagnosis: Unsteadiness on feet (R26.81);Muscle weakness (generalized) (M62.81)                Time: 9223-0097 OT Time Calculation (min): 35 min Charges:  OT General Charges $OT Visit: 1 Visit OT Evaluation $OT Eval Moderate Complexity: 1 Mod OT Treatments $Self  Care/Home Management : 8-22 mins  Amy Gothard OTR/L acute rehab services Office: [place] 03/05/2020, 2:06 PM

## 2020-03-05 NOTE — Discharge Instructions (Signed)

## 2020-03-05 NOTE — Progress Notes (Signed)
PROGRESS NOTE    Henry [name]  DXI:[id number] DOB: 1924-12-22 DOA: 03/04/2020 PCP: [name], MD   Brief Narrative:  HPI on 03/05/2019 by Dr. Fuller Plan Henry [name] is a 87 y.o. male with medical history significant of combined systolic and diastolic CHF last EF 35- 76%, moderate to severe AR, CAD s/p CABG, PAD, HTN, HLD, DM type II, CKD stage IV presents with complaints of being unable to unable to breathe over the last week, but acutely worse over the last 2 days.  Reports that with any kind of activity he has been getting more short of breath and has been very  fatigue.  Complains of having chest pressure feeling that makes it difficult for him to take a deep breathe in.  He has been taking Lasix as prescribed and still is able to make urine.  Normally he is not on oxygen at baseline.  Patient has a DNR on arrival, but states that he would like to be a full code.  patient reports that his wife is in the memory care unit and declining and he would like to be the one to bury her if possible.   Interim history Admitted for CHF exacerbation. Cardiology consulted and on IV lasix.  Assessment & Plan   Acute respiratory failure secondary to acute systolic congestive heart failure -Patient presented with shortness of breath and was found to be hypoxic down to 86% on room air -Noted to have severe faint crackles on physical exam on admission and required 6 L of nasal cannula.  Currently on high flow nasal cannula. -Chest x-ray showed interstitial opacities -BNP 808 -Patient placed on IV Lasix 80 mg twice daily -Echocardiogram pending -Cardiology consulted and appreciate -Monitor intake and output, daily weights -Patient does admit to dietary indiscretion and states he has been eating more salt and drinking more water lately  Elevated troponin/NSTEMI -Patient reports having chest pressure on admission -High-sensitivity troponin 344, 760 (although patient appears to chronically have  an elevated troponin) -History of CABG -Continue heparin drip  CKD, stage IV -Patient follows with nephrology, Dr. Marval Regal as an outpatient -Continue to monitor BMP closely given diuresis  Essential hypertension -Continue amlodipine, Lasix  Anemia of chronic disease -hemoglobin 9.1, appears to be at baseline -Patient has been receiving darbepoetin alpha every 30 days as well as on iron supplementation -Hemoglobin currently stable -monitor CBC  Hypothyroidism -TSH 6.507 -Continue Synthroid  Hyperlipidemia -Continue statin  Diabetes mellitus, type -Last hemoglobin A1c was 5.7 on 12/03/2019 -Currently not on any diabetic medications, likely diet -Continue to monitor  Goals of care -Patient is a DNR  DVT Prophylaxis  Heparin  Code Status: DNR  Family Communication: None at bedside  Disposition Plan:  Status is: Inpatient  Remains inpatient appropriate because:IV treatments appropriate due to intensity of illness or inability to take PO   Dispo: The patient is from: Home              Anticipated d/c is to: Home              Anticipated d/c date is: > 3 days              Patient currently is not medically stable to d/c.   Consultants Cardiology  Procedures  Echocardiogram  Antibiotics   Anti-infectives (From admission, onward)   None      Subjective:   Henry [name] seen and examined today.  Patient denies any further chest pain although does feel that his  breathing is still short but improved since yesterday.  States he was supposed to be on oxygen as an outpatient but did not feel that he needed it so he returned.  He denies abdominal pain, nausea or vomiting, diarrhea or constipation, dizziness or headache.  He does endorse eating shrimp and fries, hash puppies, catfish, and hot dog recently as well as drinking more water.  Objective:   Vitals:   03/05/20 0800 03/05/20 0850 03/05/20 0925 03/05/20 0939  BP: (!) 152/70 (!) 145/64 137/62   Pulse: 90  85 86   Resp:   16   Temp:      TempSrc:      SpO2: 97% 90% 97% 92%  Weight:      Height:        Intake/Output Summary (Last 24 hours) at 03/05/2020 1213 Last data filed at 03/05/2020 0900 Gross per 24 hour  Intake 647.83 ml  Output 1800 ml  Net -1152.17 ml   Filed Weights   03/04/20 0908 03/05/20 0400  Weight: 63 kg 66.3 kg    Exam  General: Well developed, elderly, chronically ill-appearing, NAD  HEENT: NCAT, , mucous membranes moist.   Cardiovascular: S1 S2 auscultated, 2/6 SEM, RRR  Respiratory: Diminished however clear  Abdomen: Soft, nontender, nondistended, + bowel sounds  Extremities: warm dry without cyanosis clubbing.  LE edema  Neuro: AAOx3, nonfocal  Psych: appropriate mood and affect   Data Reviewed: I have personally reviewed following labs and imaging studies  CBC: Recent Labs  Lab 03/04/20 0937 03/05/20 0425  WBC 10.2 9.1  NEUTROABS 7.7  --   HGB 10.6* 9.1*  HCT 33.5* 27.0*  MCV 90.3 86.8  PLT 259 893   Basic Metabolic Panel: Recent Labs  Lab 03/04/20 0937 03/05/20 0425  NA 134* 136  K 3.8 3.5  CL 103 103  CO2 19* 21*  GLUCOSE 116* 127*  BUN 47* 50*  CREATININE 3.29* 3.37*  CALCIUM 9.0 8.8*   GFR: Estimated Creatinine Clearance: 12.8 mL/min (A) (by C-G formula based on SCr of 3.37 mg/dL (H)). Liver Function Tests: Recent Labs  Lab 03/04/20 0937  AST 28  ALT 15  ALKPHOS 71  BILITOT 0.8  PROT 7.1  ALBUMIN 3.5   No results for input(s): LIPASE, AMYLASE in the last 168 hours. No results for input(s): AMMONIA in the last 168 hours. Coagulation Profile: No results for input(s): INR, PROTIME in the last 168 hours. Cardiac Enzymes: No results for input(s): CKTOTAL, CKMB, CKMBINDEX, TROPONINI in the last 168 hours. BNP (last 3 results) No results for input(s): PROBNP in the last 8760 hours. HbA1C: Recent Labs    03/05/20 0425  HGBA1C 5.6   CBG: Recent Labs  Lab 03/04/20 2027 03/05/20 0611  GLUCAP 190* 115*    Lipid Profile: No results for input(s): CHOL, HDL, LDLCALC, TRIG, CHOLHDL, LDLDIRECT in the last 72 hours. Thyroid Function Tests: Recent Labs    03/04/20 0937  TSH 6.507*   Anemia Panel: No results for input(s): VITAMINB12, FOLATE, FERRITIN, TIBC, IRON, RETICCTPCT in the last 72 hours. Urine analysis:    Component Value Date/Time   COLORURINE YELLOW 02/12/2019 1215   APPEARANCEUR CLEAR 02/12/2019 1215   LABSPEC 1.015 02/12/2019 1215   PHURINE 6.5 02/12/2019 1215   GLUCOSEU NEGATIVE 02/12/2019 1215   HGBUR NEGATIVE 02/12/2019 1215   [place] 02/12/2019 1215   KETONESUR NEGATIVE 02/12/2019 1215   PROTEINUR 100 (A) 04/10/2012 1929   UROBILINOGEN 0.2 02/12/2019 1215   NITRITE NEGATIVE 02/12/2019 1215  LEUKOCYTESUR NEGATIVE 02/12/2019 1215   Sepsis Labs: @LABRCNTIP (procalcitonin:4,lacticidven:4)  ) Recent Results (from the past 240 hour(s))  Resp Panel by RT-PCR (Flu A&B, Covid) Nasopharyngeal Swab     Status: None   Collection Time: 03/04/20  9:37 AM   Specimen: Nasopharyngeal Swab; Nasopharyngeal(NP) swabs in vial transport medium  Result Value Ref Range Status   SARS Coronavirus 2 by RT PCR NEGATIVE NEGATIVE Final    Comment: (NOTE) SARS-CoV-2 target nucleic acids are NOT DETECTED.  The SARS-CoV-2 RNA is generally detectable in upper respiratory specimens during the acute phase of infection. The lowest concentration of SARS-CoV-2 viral copies this assay can detect is 138 copies/mL. A negative result does not preclude SARS-Cov-2 infection and should not be used as the sole basis for treatment or other patient management decisions. A negative result may occur with  improper specimen collection/handling, submission of specimen other than nasopharyngeal swab, presence of viral mutation(s) within the areas targeted by this assay, and inadequate number of viral copies(<138 copies/mL). A negative result must be combined with clinical observations, patient  history, and epidemiological information. The expected result is Negative.  Fact Sheet for Patients:  EntrepreneurPulse.com.au  Fact Sheet for Healthcare Providers:  IncredibleEmployment.be  This test is no t yet approved or cleared by the [place] FDA and  has been authorized for detection and/or diagnosis of SARS-CoV-2 by FDA under an Emergency Use Authorization (EUA). This EUA will remain  in effect (meaning this test can be used) for the duration of the COVID-19 declaration under Section 564(b)(1) of the Act, 21 U.S.C.section 360bbb-3(b)(1), unless the authorization is terminated  or revoked sooner.       Influenza A by PCR NEGATIVE NEGATIVE Final   Influenza B by PCR NEGATIVE NEGATIVE Final    Comment: (NOTE) The Xpert Xpress SARS-CoV-2/FLU/RSV plus assay is intended as an aid in the diagnosis of influenza from Nasopharyngeal swab specimens and should not be used as a sole basis for treatment. Nasal washings and aspirates are unacceptable for Xpert Xpress SARS-CoV-2/FLU/RSV testing.  Fact Sheet for Patients: EntrepreneurPulse.com.au  Fact Sheet for Healthcare Providers: IncredibleEmployment.be  This test is not yet approved or cleared by the [place] FDA and has been authorized for detection and/or diagnosis of SARS-CoV-2 by FDA under an Emergency Use Authorization (EUA). This EUA will remain in effect (meaning this test can be used) for the duration of the COVID-19 declaration under Section 564(b)(1) of the Act, 21 U.S.C. section 360bbb-3(b)(1), unless the authorization is terminated or revoked.  Performed at [facility], [place] [address], [place], [place] [address]       Radiology Studies: DG Chest Portable 1 View  Result Date: 03/04/2020 CLINICAL DATA:  Shortness of breath Decreased O2 saturation Increased fatigue History of CHF EXAM: PORTABLE CHEST 1 VIEW COMPARISON:   11/11/2019 FINDINGS: Unchanged mild cardiomegaly. Postop changes of CABG again seen. There has been interval worsening of pulmonary vascular congestion. Bilateral airspace opacities have increased since prior exam, more focal in the right lower lobe lung. IMPRESSION: Interval worsening of bilateral lung opacities and pulmonary vascular congestion most consistent with worsening CHF. Given that there is asymmetric increased density in the right lower lung, findings may also be related to concurrent pneumonia. Electronically Signed   By: [name] M.D.   On: 03/04/2020 09:35     Scheduled Meds: . amLODipine  10 mg Oral QPM  . aspirin EC  81 mg Oral Daily  . atorvastatin  40 mg Oral QPM  . [  START ON 03/07/2020] calcitRIOL  0.25 mcg Oral Weekly  . calcium acetate  667 mg Oral BID WC  . furosemide  80 mg Intravenous BID  . insulin aspart  0-5 Units Subcutaneous QHS  . insulin aspart  0-9 Units Subcutaneous TID WC  . levothyroxine  50 mcg Oral QAC breakfast  . sodium chloride flush  3 mL Intravenous Q12H   Continuous Infusions: . sodium chloride    . heparin 750 Units/hr (03/05/20 0800)     LOS: 1 day   Time Spent in minutes   45 minutes  Kileen Lange D.O. on 03/05/2020 at 12:13 PM  Between 7am to 7pm - Please see pager noted on [email or web address]  After 7pm go to [email or web address]  And look for the night coverage person covering for me after hours  [facility] Office  [phone]

## 2020-03-05 NOTE — Progress Notes (Addendum)
Physical Therapy Evaluation Patient Details Name: Henry [name] MRN: [id number] DOB: [birth year] Today's Date: 03/05/2020   History of Present Illness  Henry [name] is a 87 y.o. male with a hx of CAD status post CABG with graft failure, moderate to severe aortic stenosis, not a candidate for TAVR, chronic combined CHF, peripheral arterial disease, chronic kidney disease stage IV with failed dialysis access, hypertension, diabetes mellitus, hyperlipidemia second-degree type I AV block, and orthostatic intolerance who is being seen today for the evaluation of CHF  Clinical Impression  Pt fully participated in evaluation. Pt was limited by fatigue and echo awaiting PT to finish. Pt was I prior to admission and is now requiring increased assist with all functional mobility tasks due to weakness and deficits in balance. Pt uses a quad cane and has a rollator at home but lives alone. Pt requiring O2 and states gets dizzy without it. Pt demonstrating deficits in balance, strength, coordination, gait, endurance, safety and education on DME. Pt will benefit from skilled P to address deficits to maximize independence with functional mobility prior to discharge.     Follow Up Recommendations Home health PT vs. SNF (pending progress with PT);Supervision/Assistance - 24 hour    Equipment Recommendations       Recommendations for Other Services       Precautions / Restrictions Precautions Precautions: Fall      Mobility  Bed Mobility Overal bed mobility: Needs Assistance Bed Mobility: Supine to Sit;Sit to Supine     Supine to sit: Mod assist;HOB elevated Sit to supine: Min assist        Transfers Overall transfer level: Needs assistance   Transfers: Sit to/from Stand Sit to Stand: Mod assist         General transfer comment: pulling up on therapist mod A with increased posterior lean  Ambulation/Gait Ambulation/Gait assistance: Min assist;Mod assist Gait Distance (Feet): 5  Feet         General Gait Details: pt performed side stepping to head of bed wtih B UE support on therapist, min-mod A due to increased posterioer lean, shuffling gait wtih decreased foot clearance B  Stairs            Wheelchair Mobility    Modified Rankin (Stroke Patients Only)       Balance Overall balance assessment: Needs assistance Sitting-balance support: Feet supported Sitting balance-Leahy Scale: Fair     Standing balance support: Bilateral upper extremity supported Standing balance-Leahy Scale: Poor                               Pertinent Vitals/Pain Pain Assessment: No/denies pain    Home Living Family/patient expects to be discharged to:: Private residence Living Arrangements: Alone Available Help at Discharge: Family;Available PRN/intermittently Type of Home: House Home Access: Stairs to enter Entrance Stairs-Rails: None Entrance Stairs-Number of Steps: 2 Home Layout: Two level;Able to live on main level with bedroom/bathroom Home Equipment: Grab bars - tub/shower;Cane - quad;Tub bench;Walker - 4 wheels Additional Comments: 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, Walks with quad cane    Prior Function Level of Independence: Independent with assistive device(s)               Hand Dominance        Extremity/Trunk Assessment   Upper Extremity Assessment Upper Extremity Assessment: Defer to OT evaluation    Lower Extremity Assessment Lower Extremity Assessment: Overall WFL for tasks assessed  Cervical / Trunk Assessment Cervical / Trunk Assessment: Kyphotic  Communication      Cognition Arousal/Alertness: Awake/alert Behavior During Therapy: WFL for tasks assessed/performed Overall Cognitive Status: Within Functional Limits for tasks assessed                                        General Comments General comments (skin integrity, edema, etc.): O2 dropped to 89% following movement, verbal cueing  to for breathing techniques with improved O2 to 91%    Exercises     Assessment/Plan    PT Assessment Patient needs continued PT services  PT Problem List Decreased strength;Decreased mobility;Decreased coordination;Decreased activity tolerance;Decreased balance;Decreased knowledge of use of DME       PT Treatment Interventions DME instruction;Therapeutic exercise;Gait training;Balance training;Stair training;Patient/family education;Therapeutic activities    PT Goals (Current goals can be found in the Care Plan section)  Acute Rehab PT Goals Patient Stated Goal: I need to move up in the bed PT Goal Formulation: With patient Time For Goal Achievement: 03/19/20 Potential to Achieve Goals: Fair    Frequency Min 3X/week   Barriers to discharge        Co-evaluation               AM-PAC PT "6 Clicks" Mobility  Outcome Measure Help needed turning from your back to your side while in a flat bed without using bedrails?: None Help needed moving from lying on your back to sitting on the side of a flat bed without using bedrails?: A Lot Help needed moving to and from a bed to a chair (including a wheelchair)?: A Lot Help needed standing up from a chair using your arms (e.g., wheelchair or bedside chair)?: A Lot Help needed to walk in hospital room?: A Lot Help needed climbing 3-5 steps with a railing? : A Lot 6 Click Score: 14    End of Session Equipment Utilized During Treatment: Gait belt;Oxygen Activity Tolerance: Patient tolerated treatment well;Patient limited by fatigue Patient left: in bed;with call bell/phone within reach;with bed alarm set;Other (comment) (Echo waiting for PT to finish) Nurse Communication: Mobility status PT Visit Diagnosis: Unsteadiness on feet (R26.81);Other abnormalities of gait and mobility (R26.89);Muscle weakness (generalized) (M62.81)    Time: 1030-1314 PT Time Calculation (min) (ACUTE ONLY): 11 min   Charges:   PT Evaluation $PT Eval  Low Complexity: [place], DPT Acute Rehabilitation Services [phone]  [place] 03/05/2020, 11:02 AM

## 2020-03-05 NOTE — Progress Notes (Addendum)
Progress Note  Patient Name: Henry [name] Date of Encounter: 03/05/2020  [name] Cardiologist: Henry Mocha, MD   Subjective   No chest pain, no SOB   Inpatient Medications    Scheduled Meds: . amLODipine  10 mg Oral QPM  . aspirin EC  81 mg Oral Daily  . atorvastatin  40 mg Oral QPM  . [START ON 03/07/2020] calcitRIOL  0.25 mcg Oral Weekly  . calcium acetate  667 mg Oral BID WC  . furosemide  80 mg Intravenous BID  . insulin aspart  0-5 Units Subcutaneous QHS  . insulin aspart  0-9 Units Subcutaneous TID WC  . levothyroxine  50 mcg Oral QAC breakfast  . sodium chloride flush  3 mL Intravenous Q12H   Continuous Infusions: . sodium chloride    . heparin 750 Units/hr (03/05/20 0800)   PRN Meds: sodium chloride, acetaminophen, ondansetron (ZOFRAN) IV, polyvinyl alcohol, sodium chloride flush   Vital Signs    Vitals:   03/05/20 0800 03/05/20 0850 03/05/20 0925 03/05/20 0939  BP: (!) 152/70 (!) 145/64 137/62   Pulse: 90 85 86   Resp:   16   Temp:      TempSrc:      SpO2: 97% 90% 97% 92%  Weight:      Height:        Intake/Output Summary (Last 24 hours) at 03/05/2020 1115 Last data filed at 03/05/2020 0900 Gross per 24 hour  Intake 647.83 ml  Output 1800 ml  Net -1152.17 ml   Last 3 Weights 03/05/2020 03/04/2020 02/10/2020  Weight (lbs) 146 lb 2.6 oz 139 lb 146 lb  Weight (kg) 66.3 kg 63.05 kg 66.225 kg      Telemetry    SR  - Personally Reviewed  ECG    No New  - Personally Reviewed  Physical Exam  Exam per Dr. Lovena [name]  GEN: No acute distress.   Neck: No JVD Cardiac: RRR, + 2-3/6 murmurs, rubs, or gallops.  Respiratory: Clear to auscultation bilaterally. GI: Soft, nontender, non-distended  MS: No edema; No deformity. Neuro:  Nonfocal  Psych: Normal affect   Labs    High Sensitivity Troponin:   Recent Labs  Lab 03/04/20 0937 03/04/20 1120  TROPONINIHS 344* 760*      Chemistry Recent Labs  Lab 03/04/20 0937 03/05/20 0425  NA  134* 136  K 3.8 3.5  CL 103 103  CO2 19* 21*  GLUCOSE 116* 127*  BUN 47* 50*  CREATININE 3.29* 3.37*  CALCIUM 9.0 8.8*  PROT 7.1  --   ALBUMIN 3.5  --   AST 28  --   ALT 15  --   ALKPHOS 71  --   BILITOT 0.8  --   GFRNONAA 17* 16*  ANIONGAP 12 12     Hematology Recent Labs  Lab 03/04/20 0937 03/05/20 0425  WBC 10.2 9.1  RBC 3.71* 3.11*  HGB 10.6* 9.1*  HCT 33.5* 27.0*  MCV 90.3 86.8  MCH 28.6 29.3  MCHC 31.6 33.7  RDW 15.8* 15.5  PLT 259 265    BNP Recent Labs  Lab 03/04/20 0937  BNP 808.0*     DDimer No results for input(s): DDIMER in the last 168 hours.   Radiology    DG Chest Portable 1 View  Result Date: 03/04/2020 CLINICAL DATA:  Shortness of breath Decreased O2 saturation Increased fatigue History of CHF EXAM: PORTABLE CHEST 1 VIEW COMPARISON:  11/11/2019 FINDINGS: Unchanged mild cardiomegaly. Postop changes of CABG  again seen. There has been interval worsening of pulmonary vascular congestion. Bilateral airspace opacities have increased since prior exam, more focal in the right lower lobe lung. IMPRESSION: Interval worsening of bilateral lung opacities and pulmonary vascular congestion most consistent with worsening CHF. Given that there is asymmetric increased density in the right lower lung, findings may also be related to concurrent pneumonia. Electronically Signed   By: Henry [name] M.D.   On: 03/04/2020 09:35    Cardiac Studies   Echo pending today   Patient Profile     87 y.o. male with a hx of CAD status post CABG with graft failure, moderate to severe aortic stenosis, not a candidate for TAVR, chronic combined CHF, peripheral arterial disease, chronic kidney disease stage IV with failed dialysis access, hypertension, diabetes mellitus, hyperlipidemia second-degree type I AV block, and orthostatic intolerance and followed for CHF.     Assessment & Plan    NSTEMI/SOB/acute on chronic combined CHF   -found hypoxic on arrival.  COVID-negative.   Chest x-ray with vascular congestion and may be component of pneumonia.  BNP 808.  High-sensitivity troponin 344>>760. -Patient reports symptoms similar when admitted with non-STEMI January 2020 but this time it was severe. -His symptoms could be multifactorial from valvular disease, CHF and non-STEMI Patient admits to dietary noncompliance :  Hot dog yesterday; hush puppies   -Continue  IV heparin - Lasix 80 BID IV neg 1152 and wt if accurate is up 3 Kg.  -Echo pending   No plans for cath at this time.    CKD stage IV - Creatinine 3.29 (Scr ranging from 3-3.7 recently)  Hypothyroidism  -on synthroid with elevated TSH   HTN  -BP stable to slightly elevated.  On amlodipine,   CAD s/p CABG - Last cath 02/2018 with continued patency of the LIMA-LAD and SVG-diagonal, total occlusion of the SVG-RCA and SVG-PDA - AS above - Continue ASA and statin  - Not on BB due to prior hx of bradycardia   For questions or updates, please contact Henry [name] Please consult [email or web address] for contact info under     Signed, Henry Kicks, NP  03/05/2020, 11:15 AM    Cardiology attending  Henry [name] remains but improved slightly. No chest pain. He has a minimal troponin elevation and we will plan medical therapy in light of advanced age and renal dysfunction. We will need to work on his diet. I explained the rationale for decreased fluid intake which he understands. Was drinking a lot of extra water. His weight is up which I question. We will continue to try and diurese.   Henry Overlie Sharada Albornoz,MD

## 2020-03-05 NOTE — Progress Notes (Signed)
  Echocardiogram 2D Echocardiogram has been performed.  Henry [name] 03/05/2020, 10:41 AM

## 2020-03-05 NOTE — Progress Notes (Signed)
[place] for IV heparin Indication: chest pain/ACS  No Known Allergies  Patient Measurements: Height: 5\' 9"  (175.3 cm) Weight: 66.3 kg (146 lb 2.6 oz) IBW/kg (Calculated) : 70.7 Heparin Dosing Weight: 63 kg  Vital Signs: Temp: 97.1 F (36.2 C) (01/08 0400) Temp Source: Oral (01/08 0400) BP: 137/62 (01/08 0925) Pulse Rate: 86 (01/08 0925)  Labs: Recent Labs    03/04/20 0937 03/04/20 1120 03/04/20 2307 03/05/20 0425  HGB 10.6*  --   --  9.1*  HCT 33.5*  --   --  27.0*  PLT 259  --   --  265  HEPARINUNFRC  --   --  0.52 0.34  CREATININE 3.29*  --   --  3.37*  TROPONINIHS 344* 760*  --   --     Estimated Creatinine Clearance: 12.8 mL/min (A) (by C-G formula based on SCr of 3.37 mg/dL (H)).   Assessment: 87 year old male admitted for shortness of breath, pharmacy consulted to start IV heparin for ACS/STEMI. Troponin trending up from 344 to 760. Hgb stable at 10.6 and platelets wnl.  Heparin level therapeutic (0.34) on gtt at 750 units/hr. No bleeding noted. CBC down slightly, plt stable  Goal of Therapy:  Heparin level 0.3-0.7 units/ml Monitor platelets by anticoagulation protocol: Yes   Plan:  Continue heparin at 750 units/hr Daily heparin level, CBC  Norina Buzzard, PharmD PGY1 Pharmacy Resident 03/05/2020 10:33 AM

## 2020-03-05 NOTE — Progress Notes (Signed)
Nutrition Note  RD consulted for nutrition education regarding low sodium diet.  Attempted to reach pt on phone x3. No answer and pt was having echocardiogram during one call.  RD to provide "Low Sodium Nutrition Therapy" handout from the [employer] in discharge instructions.  Compliance: unable to determine.  Can attempt again prior to discharge, may benefit more from in-person education available next week.  Body mass index is 21.58 kg/m. Pt meets criteria for normal based on current BMI.  Current diet order is heart healthy, patient is consuming approximately 95% of meals at this time. Labs and medications reviewed. No further nutrition interventions warranted at this time.  If additional nutrition issues arise, please re-consult RD.   [name] Bibles, MS, RD, LDN Inpatient Clinical Dietitian Contact information available via Amion

## 2020-03-06 DIAGNOSIS — I5020 Unspecified systolic (congestive) heart failure: Secondary | ICD-10-CM | POA: Diagnosis not present

## 2020-03-06 DIAGNOSIS — I2510 Atherosclerotic heart disease of native coronary artery without angina pectoris: Secondary | ICD-10-CM | POA: Diagnosis not present

## 2020-03-06 DIAGNOSIS — E7800 Pure hypercholesterolemia, unspecified: Secondary | ICD-10-CM

## 2020-03-06 DIAGNOSIS — I10 Essential (primary) hypertension: Secondary | ICD-10-CM | POA: Diagnosis not present

## 2020-03-06 DIAGNOSIS — E039 Hypothyroidism, unspecified: Secondary | ICD-10-CM

## 2020-03-06 DIAGNOSIS — J9601 Acute respiratory failure with hypoxia: Secondary | ICD-10-CM

## 2020-03-06 LAB — BASIC METABOLIC PANEL
Anion gap: 12 (ref 5–15)
BUN: 56 mg/dL — ABNORMAL HIGH (ref 8–23)
CO2: 22 mmol/L (ref 22–32)
Calcium: 8.9 mg/dL (ref 8.9–10.3)
Chloride: 102 mmol/L (ref 98–111)
Creatinine, Ser: 3.63 mg/dL — ABNORMAL HIGH (ref 0.61–1.24)
GFR, Estimated: 15 mL/min — ABNORMAL LOW (ref >60)
Glucose, Bld: 107 mg/dL — ABNORMAL HIGH (ref 70–99)
Potassium: 3.3 mmol/L — ABNORMAL LOW (ref 3.5–5.1)
Sodium: 136 mmol/L (ref 135–145)

## 2020-03-06 LAB — CBC
HCT: 26.6 % — ABNORMAL LOW (ref 39.0–52.0)
Hemoglobin: 8.7 g/dL — ABNORMAL LOW (ref 13.0–17.0)
MCH: 28.7 pg (ref 26.0–34.0)
MCHC: 32.7 g/dL (ref 30.0–36.0)
MCV: 87.8 fL (ref 80.0–100.0)
Platelets: 240 10*3/uL (ref 150–400)
RBC: 3.03 MIL/uL — ABNORMAL LOW (ref 4.22–5.81)
RDW: 15.5 % (ref 11.5–15.5)
WBC: 7.8 10*3/uL (ref 4.0–10.5)
nRBC: 0 % (ref 0.0–0.2)

## 2020-03-06 LAB — HEPARIN LEVEL (UNFRACTIONATED)
Heparin Unfractionated: 0.14 IU/mL — ABNORMAL LOW (ref 0.30–0.70)
Heparin Unfractionated: 0.22 IU/mL — ABNORMAL LOW (ref 0.30–0.70)
Heparin Unfractionated: 0.28 IU/mL — ABNORMAL LOW (ref 0.30–0.70)

## 2020-03-06 LAB — GLUCOSE, CAPILLARY
Glucose-Capillary: 107 mg/dL — ABNORMAL HIGH (ref 70–99)
Glucose-Capillary: 167 mg/dL — ABNORMAL HIGH (ref 70–99)
Glucose-Capillary: 120 mg/dL — ABNORMAL HIGH (ref 70–99)
Glucose-Capillary: 145 mg/dL — ABNORMAL HIGH (ref 70–99)

## 2020-03-06 MED ORDER — POTASSIUM CHLORIDE CRYS ER 20 MEQ PO TBCR
40.0000 meq | EXTENDED_RELEASE_TABLET | Freq: Once | ORAL | Status: AC
  Administered 2020-03-06: 40 meq via ORAL
  Filled 2020-03-06: qty 2

## 2020-03-06 MED ORDER — HEPARIN BOLUS VIA INFUSION
1000.0000 [IU] | Freq: Once | INTRAVENOUS | Status: AC
  Administered 2020-03-06: 1000 [IU] via INTRAVENOUS
  Filled 2020-03-06: qty 1000

## 2020-03-06 NOTE — Progress Notes (Signed)
SATURATION QUALIFICATIONS: (This note is used to comply with regulatory documentation for home oxygen)  Patient Saturations on Room Air at Rest = 94%  Patient Saturations on Room Air while Ambulating = 82%  Patient Saturations on  4 Liters of oxygen while Ambulating = 90%  Please briefly explain why patient needs home oxygen:  Patient is short of breathe as the patient moves to the edge of the bed. Patient becomes more short of breathe going from a sitting to a standing position. Will need home oxygen for discharge.

## 2020-03-06 NOTE — Progress Notes (Signed)
PROGRESS NOTE    Henry [name]  HGD:[id number] DOB: 1924/04/05 DOA: 03/04/2020 PCP: [name], MD   Brief Narrative:  HPI on 03/05/2019 by Dr. Fuller Plan Henry [name] is a 87 y.o. male with medical history significant of combined systolic and diastolic CHF last EF 35- 96%, moderate to severe AR, CAD s/p CABG, PAD, HTN, HLD, DM type II, CKD stage IV presents with complaints of being unable to unable to breathe over the last week, but acutely worse over the last 2 days.  Reports that with any kind of activity he has been getting more short of breath and has been very  fatigue.  Complains of having chest pressure feeling that makes it difficult for him to take a deep breathe in.  He has been taking Lasix as prescribed and still is able to make urine.  Normally he is not on oxygen at baseline.  Patient has a DNR on arrival, but states that he would like to be a full code.  patient reports that his wife is in the memory care unit and declining and he would like to be the one to bury her if possible.   Interim history Admitted for CHF exacerbation. Cardiology consulted and on IV lasix.  Assessment & Plan   Acute respiratory failure secondary to acute systolic congestive heart failure -Patient presented with shortness of breath and was found to be hypoxic down to 86% on room air -Noted to have severe faint crackles on physical exam on admission and required 6 L of nasal cannula.  Currently on high flow nasal cannula. -Chest x-ray showed interstitial opacities -BNP 808 -Patient placed on IV Lasix 80 mg twice daily -Echocardiogram EF 30 to 22%, grade 1 diastolic dysfunction.  Moderate aortic stenosis -Cardiology consulted and appreciate -Monitor intake and output, daily weights -Patient does admit to dietary indiscretion and states he has been eating more salt and drinking more water lately  Elevated troponin/NSTEMI -Patient reports having chest pressure on admission -High-sensitivity  troponin 344, 760 (although patient appears to chronically have an elevated troponin) -History of CABG -Continue heparin drip  Dizziness -Likely multifactorial including chronic anemia versus diuresis -Will continue to monitor closely  CKD, stage IV -Patient follows with nephrology, Dr. Marval Regal as an outpatient -Continue to monitor BMP closely given diuresis  Essential hypertension -Continue amlodipine, Lasix  Anemia of chronic disease -hemoglobin 8.7, appears to be close to baseline (7-9) -Patient has been receiving darbepoetin alpha every 30 days as well as on iron supplementation -Hemoglobin currently stable -monitor CBC  Hypothyroidism -TSH 6.507 -Continue Synthroid  Hyperlipidemia -Continue statin  Diabetes mellitus, type -Last hemoglobin A1c was 5.7 on 12/03/2019 -Currently not on any diabetic medications, likely diet controlled -Continue to monitor  Goals of care -Patient is a DNR  Weakness/deconditioning -Suspect secondary to the above -PT and OT evaluated patient and feel patient may be home health appropriate but may need supervision, will continue to work with patient  Hypokalemia -Will supplement and continue to monitor  DVT Prophylaxis  Heparin  Code Status: DNR  Family Communication: None at bedside.  Daughter via phone  Disposition Plan:  Status is: Inpatient  Remains inpatient appropriate because:IV treatments appropriate due to intensity of illness or inability to take PO   Dispo: The patient is from: Home              Anticipated d/c is to: Home              Anticipated d/c date  is: > 3 days              Patient currently is not medically stable to d/c.   Consultants Cardiology  Procedures  Echocardiogram  Antibiotics   Anti-infectives (From admission, onward)   None      Subjective:   Henry [name] seen and examined today.  Continues to complain of feeling weak and feels her shortness of breath is still there however  improved from when he first came to the hospital.  Complains of feeling dizzy.  Denies current chest pain.  Denies abdominal pain, nausea or vomiting, diarrhea or constipation, dizziness or headache.    Objective:   Vitals:   03/06/20 0001 03/06/20 0344 03/06/20 0400 03/06/20 0746  BP: 134/61  126/60 131/65  Pulse: 78  71 69  Resp:    18  Temp: (!) 97.4 F (36.3 C)  97.8 F (36.6 C) 97.9 F (36.6 C)  TempSrc: Oral  Oral Oral  SpO2: 98%  97% 98%  Weight:  65.4 kg    Height:        Intake/Output Summary (Last 24 hours) at 03/06/2020 1053 Last data filed at 03/06/2020 0750 Gross per 24 hour  Intake 285 ml  Output 1650 ml  Net -1365 ml   Filed Weights   03/04/20 0908 03/05/20 0400 03/06/20 0344  Weight: 63 kg 66.3 kg 65.4 kg   Exam  General: Well developed, elderly, chronically ill-appearing, NAD  HEENT: NCAT, mucous membranes moist.   Cardiovascular: S1 S2 auscultated, 2/6 SEM, RRR  Respiratory: Diminished breath sounds  Abdomen: Soft, nontender, nondistended, + bowel sounds  Extremities: warm dry without cyanosis clubbing.  No lower extremity edema  Neuro: AAOx3, nonfocal  Psych: appropriate mood and affect, pleasant  Data Reviewed: I have personally reviewed following labs and imaging studies  CBC: Recent Labs  Lab 03/04/20 0937 03/05/20 0425 03/06/20 0251  WBC 10.2 9.1 7.8  NEUTROABS 7.7  --   --   HGB 10.6* 9.1* 8.7*  HCT 33.5* 27.0* 26.6*  MCV 90.3 86.8 87.8  PLT 259 265 097   Basic Metabolic Panel: Recent Labs  Lab 03/04/20 0937 03/05/20 0425 03/06/20 0251  NA 134* 136 136  K 3.8 3.5 3.3*  CL 103 103 102  CO2 19* 21* 22  GLUCOSE 116* 127* 107*  BUN 47* 50* 56*  CREATININE 3.29* 3.37* 3.63*  CALCIUM 9.0 8.8* 8.9   GFR: Estimated Creatinine Clearance: 11.8 mL/min (A) (by C-G formula based on SCr of 3.63 mg/dL (H)). Liver Function Tests: Recent Labs  Lab 03/04/20 0937  AST 28  ALT 15  ALKPHOS 71  BILITOT 0.8  PROT 7.1  ALBUMIN 3.5    No results for input(s): LIPASE, AMYLASE in the last 168 hours. No results for input(s): AMMONIA in the last 168 hours. Coagulation Profile: No results for input(s): INR, PROTIME in the last 168 hours. Cardiac Enzymes: No results for input(s): CKTOTAL, CKMB, CKMBINDEX, TROPONINI in the last 168 hours. BNP (last 3 results) No results for input(s): PROBNP in the last 8760 hours. HbA1C: Recent Labs    03/05/20 0425  HGBA1C 5.6   CBG: Recent Labs  Lab 03/05/20 0611 03/05/20 1116 03/05/20 1627 03/05/20 2152 03/06/20 0642  GLUCAP 115* 133* 124* 128* 107*   Lipid Profile: No results for input(s): CHOL, HDL, LDLCALC, TRIG, CHOLHDL, LDLDIRECT in the last 72 hours. Thyroid Function Tests: Recent Labs    03/04/20 0937  TSH 6.507*   Anemia Panel: No results for input(s): VITAMINB12,  FOLATE, FERRITIN, TIBC, IRON, RETICCTPCT in the last 72 hours. Urine analysis:    Component Value Date/Time   COLORURINE YELLOW 02/12/2019 1215   APPEARANCEUR CLEAR 02/12/2019 1215   LABSPEC 1.015 02/12/2019 1215   PHURINE 6.5 02/12/2019 1215   GLUCOSEU NEGATIVE 02/12/2019 1215   HGBUR NEGATIVE 02/12/2019 1215   [place] 02/12/2019 1215   KETONESUR NEGATIVE 02/12/2019 1215   PROTEINUR 100 (A) 04/10/2012 1929   UROBILINOGEN 0.2 02/12/2019 1215   NITRITE NEGATIVE 02/12/2019 1215   LEUKOCYTESUR NEGATIVE 02/12/2019 1215   Sepsis Labs: @LABRCNTIP (procalcitonin:4,lacticidven:4)  ) Recent Results (from the past 240 hour(s))  Resp Panel by RT-PCR (Flu A&B, Covid) Nasopharyngeal Swab     Status: None   Collection Time: 03/04/20  9:37 AM   Specimen: Nasopharyngeal Swab; Nasopharyngeal(NP) swabs in vial transport medium  Result Value Ref Range Status   SARS Coronavirus 2 by RT PCR NEGATIVE NEGATIVE Final    Comment: (NOTE) SARS-CoV-2 target nucleic acids are NOT DETECTED.  The SARS-CoV-2 RNA is generally detectable in upper respiratory specimens during the acute phase of infection.  The lowest concentration of SARS-CoV-2 viral copies this assay can detect is 138 copies/mL. A negative result does not preclude SARS-Cov-2 infection and should not be used as the sole basis for treatment or other patient management decisions. A negative result may occur with  improper specimen collection/handling, submission of specimen other than nasopharyngeal swab, presence of viral mutation(s) within the areas targeted by this assay, and inadequate number of viral copies(<138 copies/mL). A negative result must be combined with clinical observations, patient history, and epidemiological information. The expected result is Negative.  Fact Sheet for Patients:  EntrepreneurPulse.com.au  Fact Sheet for Healthcare Providers:  IncredibleEmployment.be  This test is no t yet approved or cleared by the [place] FDA and  has been authorized for detection and/or diagnosis of SARS-CoV-2 by FDA under an Emergency Use Authorization (EUA). This EUA will remain  in effect (meaning this test can be used) for the duration of the COVID-19 declaration under Section 564(b)(1) of the Act, 21 U.S.C.section 360bbb-3(b)(1), unless the authorization is terminated  or revoked sooner.       Influenza A by PCR NEGATIVE NEGATIVE Final   Influenza B by PCR NEGATIVE NEGATIVE Final    Comment: (NOTE) The Xpert Xpress SARS-CoV-2/FLU/RSV plus assay is intended as an aid in the diagnosis of influenza from Nasopharyngeal swab specimens and should not be used as a sole basis for treatment. Nasal washings and aspirates are unacceptable for Xpert Xpress SARS-CoV-2/FLU/RSV testing.  Fact Sheet for Patients: EntrepreneurPulse.com.au  Fact Sheet for Healthcare Providers: IncredibleEmployment.be  This test is not yet approved or cleared by the [place] FDA and has been authorized for detection and/or diagnosis of SARS-CoV-2 by FDA  under an Emergency Use Authorization (EUA). This EUA will remain in effect (meaning this test can be used) for the duration of the COVID-19 declaration under Section 564(b)(1) of the Act, 21 U.S.C. section 360bbb-3(b)(1), unless the authorization is terminated or revoked.  Performed at [facility], [place] [address], [place], [place] [address]       Radiology Studies: ECHOCARDIOGRAM COMPLETE  Result Date: 03/05/2020    ECHOCARDIOGRAM REPORT   Patient Name:   Henry [name] [facility] Date of Exam: 03/05/2020 Medical Rec #:  [id number]        Height:       69.0 in Accession #:    [id number]       Weight:  146.2 lb Date of Birth:  1925/01/14         BSA:          1.808 m Patient Age:    53 years         BP:           137/62 mmHg Patient Gender: M                HR:           86 bpm. Exam Location:  Inpatient Procedure: 2D Echo, Cardiac Doppler and Color Doppler Indications:    CHF-Acute Systolic  History:        Patient has prior history of Echocardiogram examinations, most                 recent 11/10/2019. Previous Myocardial Infarction and CAD, Aortic                 Valve Disease, Signs/Symptoms:Shortness of Breath; Risk                 Factors:Hypertension, Dyslipidemia and Diabetes. CKD.  Sonographer:    [name] Lefort RDCS (AE) Referring Phys: [id number] Henry [name] IMPRESSIONS  1. Left ventricular ejection fraction, by estimation, is 30 to 35%. The left ventricle has moderately decreased function. The left ventricle demonstrates global hypokinesis. Left ventricular diastolic parameters are consistent with Grade I diastolic dysfunction (impaired relaxation).  2. Right ventricular systolic function is normal. The right ventricular size is normal. There is mildly elevated pulmonary artery systolic pressure.  3. Left atrial size was moderately dilated.  4. The mitral valve is normal in structure. Mild mitral valve regurgitation. No evidence of mitral stenosis.  5. Mean gradient is consistent with mild aortic  stenosis, unchanged form 10/2019. However, dimensionless index is 0.24. This is decreased from 0.26 on 10/2019. Consistent with at least moderate aortic stenosis. Cannot exclude low-flow, low-gradient aortic  stenosis. The aortic valve is calcified. There is moderate calcification of the aortic valve. There is moderate thickening of the aortic valve. Aortic valve regurgitation is mild. Moderate to severe aortic valve stenosis. Aortic valve area, by VTI measures 0.76 cm. Aortic valve mean gradient measures 17.0 mmHg. Aortic valve Vmax measures 2.68 m/s.  6. The inferior vena cava is normal in size with <50% respiratory variability, suggesting right atrial pressure of 8 mmHg. FINDINGS  Left Ventricle: Left ventricular ejection fraction, by estimation, is 30 to 35%. The left ventricle has moderately decreased function. The left ventricle demonstrates global hypokinesis. The left ventricular internal cavity size was normal in size. There is no left ventricular hypertrophy. Left ventricular diastolic parameters are consistent with Grade I diastolic dysfunction (impaired relaxation). Right Ventricle: The right ventricular size is normal. No increase in right ventricular wall thickness. Right ventricular systolic function is normal. There is mildly elevated pulmonary artery systolic pressure. The tricuspid regurgitant velocity is 2.82  m/s, and with an assumed right atrial pressure of 8 mmHg, the estimated right ventricular systolic pressure is 19.4 mmHg. Left Atrium: Left atrial size was moderately dilated. Right Atrium: Right atrial size was normal in size. Pericardium: There is no evidence of pericardial effusion. Mitral Valve: The mitral valve is normal in structure. Mild mitral valve regurgitation. No evidence of mitral valve stenosis. MV peak gradient, 10.6 mmHg. The mean mitral valve gradient is 4.0 mmHg. Tricuspid Valve: The tricuspid valve is normal in structure. Tricuspid valve regurgitation is mild . No evidence  of tricuspid stenosis. Aortic Valve: Mean gradient is consistent  with mild aortic stenosis, unchanged form 10/2019. However, dimensionless index is 0.24. This is decreased from 0.26 on 10/2019. Consistent with at least moderate aortic stenosis. Cannot exclude low-flow, low-gradient aortic stenosis. The aortic valve is calcified. There is moderate calcification of the aortic valve. There is moderate thickening of the aortic valve. Aortic valve regurgitation is mild. Aortic regurgitation PHT measures 240 msec. Moderate to severe aortic stenosis is present. Aortic valve mean gradient measures 17.0 mmHg. Aortic valve peak gradient measures 28.6 mmHg. Aortic valve area, by VTI measures 0.76 cm. Pulmonic Valve: The pulmonic valve was normal in structure. Pulmonic valve regurgitation is not visualized. No evidence of pulmonic stenosis. Aorta: The aortic root is normal in size and structure. Venous: The inferior vena cava is normal in size with less than 50% respiratory variability, suggesting right atrial pressure of 8 mmHg. IAS/Shunts: No atrial level shunt detected by color flow Doppler.  LEFT VENTRICLE PLAX 2D LVIDd:         4.60 cm  Diastology LVIDs:         3.90 cm  LV e' medial:    2.03 cm/s LV PW:         1.20 cm  LV E/e' medial:  57.1 LV IVS:        1.30 cm  LV e' lateral:   6.45 cm/s LVOT diam:     2.00 cm  LV E/e' lateral: 18.0 LV SV:         43 LV SV Index:   24 LVOT Area:     3.14 cm                          3D Volume EF:                         3D EF:        32 %                         LV EDV:       187 ml                         LV ESV:       127 ml                         LV SV:        60 ml RIGHT VENTRICLE            IVC RV Basal diam:  2.60 cm    IVC diam: 1.40 cm RV S prime:     9.75 cm/s TAPSE (M-mode): 1.9 cm LEFT ATRIUM             Index       RIGHT ATRIUM           Index LA diam:        4.00 cm 2.21 cm/m  RA Area:     14.20 cm LA Vol (A2C):   55.9 ml 30.91 ml/m RA Volume:   30.30 ml  16.76 ml/m  LA Vol (A4C):   62.7 ml 34.67 ml/m LA Biplane Vol: 61.1 ml 33.79 ml/m  AORTIC VALVE AV Area (Vmax):    0.84 cm AV Area (Vmean):   0.75 cm AV Area (VTI):     0.76 cm AV Vmax:  267.50 cm/s AV Vmean:          196.500 cm/s AV VTI:            0.568 m AV Peak Grad:      28.6 mmHg AV Mean Grad:      17.0 mmHg LVOT Vmax:         71.80 cm/s LVOT Vmean:        46.700 cm/s LVOT VTI:          0.137 m LVOT/AV VTI ratio: 0.24 AI PHT:            240 msec  AORTA Ao Root diam: 3.80 cm Ao Asc diam:  3.80 cm MITRAL VALVE                TRICUSPID VALVE MV Area (PHT): 5.97 cm     TR Peak grad:   31.8 mmHg MV Peak grad:  10.6 mmHg    TR Vmax:        282.00 cm/s MV Mean grad:  4.0 mmHg MV Vmax:       1.63 m/s     SHUNTS MV Vmean:      87.6 cm/s    Systemic VTI:  0.14 m MV Decel Time: 127 msec     Systemic Diam: 2.00 cm MV E velocity: 116.00 cm/s MV A velocity: 143.00 cm/s MV E/A ratio:  0.81 Skeet Latch MD Electronically signed by Skeet Latch MD Signature Date/Time: 03/05/2020/3:16:34 PM    Final      Scheduled Meds: . amLODipine  10 mg Oral QPM  . aspirin EC  81 mg Oral Daily  . atorvastatin  40 mg Oral QPM  . [START ON 03/07/2020] calcitRIOL  0.25 mcg Oral Weekly  . calcium acetate  667 mg Oral BID WC  . furosemide  80 mg Intravenous BID  . insulin aspart  0-5 Units Subcutaneous QHS  . insulin aspart  0-9 Units Subcutaneous TID WC  . levothyroxine  50 mcg Oral QAC breakfast  . sodium chloride flush  3 mL Intravenous Q12H   Continuous Infusions: . sodium chloride    . heparin 900 Units/hr (03/06/20 0425)     LOS: 2 days   Time Spent in minutes   45 minutes  Chenika Nevils D.O. on 03/06/2020 at 10:53 AM  Between 7am to 7pm - Please see pager noted on [email or web address]  After 7pm go to [email or web address]  And look for the night coverage person covering for me after hours  [facility] Office  [phone]

## 2020-03-06 NOTE — Progress Notes (Signed)
ANTICOAGULATION CONSULT NOTE - Follow Up Consult  Pharmacy Consult for heparin Indication: NSTEMI  Labs: Recent Labs    03/04/20 0937 03/04/20 1120 03/04/20 2307 03/05/20 0425 03/06/20 0251  HGB 10.6*  --   --  9.1* 8.7*  HCT 33.5*  --   --  27.0* 26.6*  PLT 259  --   --  265 240  HEPARINUNFRC  --   --  0.52 0.34 0.14*  CREATININE 3.29*  --   --  3.37*  --   TROPONINIHS 344* 760*  --   --   --     Assessment: 87yo male subtherapeutic on heparin after two levels at goal though had been trending down; no gtt issues or signs of bleeding per RN.  Goal of Therapy:  Heparin level 0.3-0.7 units/ml   Plan:  Will give small heparin bolus of 1000 units and increase heparin gtt by 2 units/kg/hr to 900 units/hr and check level in 8 hours.    Wynona Neat, PharmD, BCPS  03/06/2020,4:19 AM

## 2020-03-06 NOTE — Progress Notes (Signed)
ANTICOAGULATION CONSULT NOTE - Follow Up Consult  Pharmacy Consult for heparin Indication: NSTEMI  Labs: Recent Labs    03/04/20 0937 03/04/20 1120 03/04/20 2307 03/05/20 0425 03/06/20 0251 03/06/20 1159 03/06/20 2141  HGB 10.6*  --   --  9.1* 8.7*  --   --   HCT 33.5*  --   --  27.0* 26.6*  --   --   PLT 259  --   --  265 240  --   --   HEPARINUNFRC  --   --    < > 0.34 0.14* 0.22* 0.28*  CREATININE 3.29*  --   --  3.37* 3.63*  --   --   TROPONINIHS 344* 760*  --   --   --   --   --    < > = values in this interval not displayed.   Heparin DW: 63kg  Assessment: 87yo male continues to be below goal on heparin; no gtt issues or signs of bleeding noted.   Goal of Therapy:  Heparin level 0.3-0.7 units/ml   Plan:  Increase heparin gtt to 1150 units/hr Daily heparin and CBC level  Erin Hearing PharmD., BCPS Clinical Pharmacist 03/06/2020 10:23 PM

## 2020-03-06 NOTE — Progress Notes (Signed)
Progress Note  Patient Name: Henry [name] Date of Encounter: 03/06/2020  Primary Cardiologist: Sherren Mocha, MD   Subjective   "I am still weak and feels short of breath"  Inpatient Medications    Scheduled Meds: . amLODipine  10 mg Oral QPM  . aspirin EC  81 mg Oral Daily  . atorvastatin  40 mg Oral QPM  . [START ON 03/07/2020] calcitRIOL  0.25 mcg Oral Weekly  . calcium acetate  667 mg Oral BID WC  . furosemide  80 mg Intravenous BID  . insulin aspart  0-5 Units Subcutaneous QHS  . insulin aspart  0-9 Units Subcutaneous TID WC  . levothyroxine  50 mcg Oral QAC breakfast  . sodium chloride flush  3 mL Intravenous Q12H   Continuous Infusions: . sodium chloride    . heparin 900 Units/hr (03/06/20 0425)   PRN Meds: sodium chloride, acetaminophen, ondansetron (ZOFRAN) IV, polyvinyl alcohol, sodium chloride flush   Vital Signs    Vitals:   03/06/20 0001 03/06/20 0344 03/06/20 0400 03/06/20 0746  BP: 134/61  126/60 131/65  Pulse: 78  71 69  Resp:    18  Temp: (!) 97.4 F (36.3 C)  97.8 F (36.6 C) 97.9 F (36.6 C)  TempSrc: Oral  Oral Oral  SpO2: 98%  97% 98%  Weight:  65.4 kg    Height:        Intake/Output Summary (Last 24 hours) at 03/06/2020 0936 Last data filed at 03/06/2020 0750 Gross per 24 hour  Intake 285 ml  Output 1650 ml  Net -1365 ml   Filed Weights   03/04/20 0908 03/05/20 0400 03/06/20 0344  Weight: 63 kg 66.3 kg 65.4 kg    Telemetry    Normal sinus rhythm- Personally Reviewed  ECG    None- Personally Reviewed  Physical Exam   GEN:  Frail-appearing, elderly man, no acute distress.   Neck:  6 cm JVD Cardiac: RRR, 1 out of 6 systolic murmurs, rubs, or gallops.  Respiratory: Clear to auscultation bilaterally except for scattered basilar rales GI: Soft, nontender, non-distended  MS: No edema; No deformity. Neuro:  Nonfocal  Psych: Normal affect   Labs    Chemistry Recent Labs  Lab 03/04/20 0937 03/05/20 0425 03/06/20 0251   NA 134* 136 136  K 3.8 3.5 3.3*  CL 103 103 102  CO2 19* 21* 22  GLUCOSE 116* 127* 107*  BUN 47* 50* 56*  CREATININE 3.29* 3.37* 3.63*  CALCIUM 9.0 8.8* 8.9  PROT 7.1  --   --   ALBUMIN 3.5  --   --   AST 28  --   --   ALT 15  --   --   ALKPHOS 71  --   --   BILITOT 0.8  --   --   GFRNONAA 17* 16* 15*  ANIONGAP 12 12 12      Hematology Recent Labs  Lab 03/04/20 0937 03/05/20 0425 03/06/20 0251  WBC 10.2 9.1 7.8  RBC 3.71* 3.11* 3.03*  HGB 10.6* 9.1* 8.7*  HCT 33.5* 27.0* 26.6*  MCV 90.3 86.8 87.8  MCH 28.6 29.3 28.7  MCHC 31.6 33.7 32.7  RDW 15.8* 15.5 15.5  PLT 259 265 240    Cardiac EnzymesNo results for input(s): TROPONINI in the last 168 hours. No results for input(s): TROPIPOC in the last 168 hours.   BNP Recent Labs  Lab 03/04/20 0937  BNP 808.0*     DDimer No results for input(s): DDIMER  in the last 168 hours.   Radiology    ECHOCARDIOGRAM COMPLETE  Result Date: 03/05/2020    ECHOCARDIOGRAM REPORT   Patient Name:   Henry [name] [facility] Date of Exam: 03/05/2020 Medical Rec #:  [id number]        Height:       69.0 in Accession #:    [id number]       Weight:       146.2 lb Date of Birth:  03/07/1924         BSA:          1.808 m Patient Age:    87 years         BP:           137/62 mmHg Patient Gender: M                HR:           86 bpm. Exam Location:  Inpatient Procedure: 2D Echo, Cardiac Doppler and Color Doppler Indications:    CHF-Acute Systolic  History:        Patient has prior history of Echocardiogram examinations, most                 recent 11/10/2019. Previous Myocardial Infarction and CAD, Aortic                 Valve Disease, Signs/Symptoms:Shortness of Breath; Risk                 Factors:Hypertension, Dyslipidemia and Diabetes. CKD.  Sonographer:    [name] Lefort RDCS (AE) Referring Phys: [id number] RONDELL A SMITH IMPRESSIONS  1. Left ventricular ejection fraction, by estimation, is 30 to 35%. The left ventricle has moderately decreased function. The left  ventricle demonstrates global hypokinesis. Left ventricular diastolic parameters are consistent with Grade I diastolic dysfunction (impaired relaxation).  2. Right ventricular systolic function is normal. The right ventricular size is normal. There is mildly elevated pulmonary artery systolic pressure.  3. Left atrial size was moderately dilated.  4. The mitral valve is normal in structure. Mild mitral valve regurgitation. No evidence of mitral stenosis.  5. Mean gradient is consistent with mild aortic stenosis, unchanged form 10/2019. However, dimensionless index is 0.24. This is decreased from 0.26 on 10/2019. Consistent with at least moderate aortic stenosis. Cannot exclude low-flow, low-gradient aortic  stenosis. The aortic valve is calcified. There is moderate calcification of the aortic valve. There is moderate thickening of the aortic valve. Aortic valve regurgitation is mild. Moderate to severe aortic valve stenosis. Aortic valve area, by VTI measures 0.76 cm. Aortic valve mean gradient measures 17.0 mmHg. Aortic valve Vmax measures 2.68 m/s.  6. The inferior vena cava is normal in size with <50% respiratory variability, suggesting right atrial pressure of 8 mmHg. FINDINGS  Left Ventricle: Left ventricular ejection fraction, by estimation, is 30 to 35%. The left ventricle has moderately decreased function. The left ventricle demonstrates global hypokinesis. The left ventricular internal cavity size was normal in size. There is no left ventricular hypertrophy. Left ventricular diastolic parameters are consistent with Grade I diastolic dysfunction (impaired relaxation). Right Ventricle: The right ventricular size is normal. No increase in right ventricular wall thickness. Right ventricular systolic function is normal. There is mildly elevated pulmonary artery systolic pressure. The tricuspid regurgitant velocity is 2.82  m/s, and with an assumed right atrial pressure of 8 mmHg, the estimated right ventricular  systolic pressure is 01.7 mmHg. Left Atrium: Left atrial size was moderately dilated.  Right Atrium: Right atrial size was normal in size. Pericardium: There is no evidence of pericardial effusion. Mitral Valve: The mitral valve is normal in structure. Mild mitral valve regurgitation. No evidence of mitral valve stenosis. MV peak gradient, 10.6 mmHg. The mean mitral valve gradient is 4.0 mmHg. Tricuspid Valve: The tricuspid valve is normal in structure. Tricuspid valve regurgitation is mild . No evidence of tricuspid stenosis. Aortic Valve: Mean gradient is consistent with mild aortic stenosis, unchanged form 10/2019. However, dimensionless index is 0.24. This is decreased from 0.26 on 10/2019. Consistent with at least moderate aortic stenosis. Cannot exclude low-flow, low-gradient aortic stenosis. The aortic valve is calcified. There is moderate calcification of the aortic valve. There is moderate thickening of the aortic valve. Aortic valve regurgitation is mild. Aortic regurgitation PHT measures 240 msec. Moderate to severe aortic stenosis is present. Aortic valve mean gradient measures 17.0 mmHg. Aortic valve peak gradient measures 28.6 mmHg. Aortic valve area, by VTI measures 0.76 cm. Pulmonic Valve: The pulmonic valve was normal in structure. Pulmonic valve regurgitation is not visualized. No evidence of pulmonic stenosis. Aorta: The aortic root is normal in size and structure. Venous: The inferior vena cava is normal in size with less than 50% respiratory variability, suggesting right atrial pressure of 8 mmHg. IAS/Shunts: No atrial level shunt detected by color flow Doppler.  LEFT VENTRICLE PLAX 2D LVIDd:         4.60 cm  Diastology LVIDs:         3.90 cm  LV e' medial:    2.03 cm/s LV PW:         1.20 cm  LV E/e' medial:  57.1 LV IVS:        1.30 cm  LV e' lateral:   6.45 cm/s LVOT diam:     2.00 cm  LV E/e' lateral: 18.0 LV SV:         43 LV SV Index:   24 LVOT Area:     3.14 cm                          3D  Volume EF:                         3D EF:        32 %                         LV EDV:       187 ml                         LV ESV:       127 ml                         LV SV:        60 ml RIGHT VENTRICLE            IVC RV Basal diam:  2.60 cm    IVC diam: 1.40 cm RV S prime:     9.75 cm/s TAPSE (M-mode): 1.9 cm LEFT ATRIUM             Index       RIGHT ATRIUM           Index LA diam:        4.00 cm 2.21 cm/m  RA Area:  14.20 cm LA Vol (A2C):   55.9 ml 30.91 ml/m RA Volume:   30.30 ml  16.76 ml/m LA Vol (A4C):   62.7 ml 34.67 ml/m LA Biplane Vol: 61.1 ml 33.79 ml/m  AORTIC VALVE AV Area (Vmax):    0.84 cm AV Area (Vmean):   0.75 cm AV Area (VTI):     0.76 cm AV Vmax:           267.50 cm/s AV Vmean:          196.500 cm/s AV VTI:            0.568 m AV Peak Grad:      28.6 mmHg AV Mean Grad:      17.0 mmHg LVOT Vmax:         71.80 cm/s LVOT Vmean:        46.700 cm/s LVOT VTI:          0.137 m LVOT/AV VTI ratio: 0.24 AI PHT:            240 msec  AORTA Ao Root diam: 3.80 cm Ao Asc diam:  3.80 cm MITRAL VALVE                TRICUSPID VALVE MV Area (PHT): 5.97 cm     TR Peak grad:   31.8 mmHg MV Peak grad:  10.6 mmHg    TR Vmax:        282.00 cm/s MV Mean grad:  4.0 mmHg MV Vmax:       1.63 m/s     SHUNTS MV Vmean:      87.6 cm/s    Systemic VTI:  0.14 m MV Decel Time: 127 msec     Systemic Diam: 2.00 cm MV E velocity: 116.00 cm/s MV A velocity: 143.00 cm/s MV E/A ratio:  0.81 Skeet Latch MD Electronically signed by Skeet Latch MD Signature Date/Time: 03/05/2020/3:16:34 PM    Final     Cardiac Studies   None  Patient Profile     87 y.o. male with a history of coronary artery disease status post bypass grafting, moderate aortic stenosis, chronic renal insufficiency, stage IV with failed dialysis access, and worsening anemia  Assessment & Plan    1.  NSTEMI -he denies anginal symptoms.  I suspect that his elevated troponin is simply a manifestation of his volume overload in the setting of  underlying chronic coronary artery disease 2.  Acute on chronic systolic and diastolic heart failure -his volume status is still suboptimal.  He did diurese 1 kg, but unfortunately his creatinine increased from 3.3-3.6. 3.  Acute on chronic renal failure -in review of his admission several months ago, his numbers are not much different.  Continue gentle diuresis 4.  Worsening anemia -no obvious bleeding, but his hemoglobin has dropped 2 g over the last 48 hours.  Consider transfusion.     For questions or updates, please contact [place] Please consult [email or web address] for contact info under Cardiology/STEMI.      Signed, Cristopher Peru, MD  03/06/2020, 9:36 AM  Patient ID: [name], male   DOB: 1924/04/29, 87 y.o.   MRN: [id number]

## 2020-03-06 NOTE — Progress Notes (Signed)
ANTICOAGULATION CONSULT NOTE - Follow Up Consult  Pharmacy Consult for heparin Indication: NSTEMI  Labs: Recent Labs    03/04/20 0937 03/04/20 1120 03/04/20 2307 03/05/20 0425 03/06/20 0251 03/06/20 1159  HGB 10.6*  --   --  9.1* 8.7*  --   HCT 33.5*  --   --  27.0* 26.6*  --   PLT 259  --   --  265 240  --   HEPARINUNFRC  --   --    < > 0.34 0.14* 0.22*  CREATININE 3.29*  --   --  3.37* 3.63*  --   TROPONINIHS 344* 760*  --   --   --   --    < > = values in this interval not displayed.   Heparin DW: 63kg  Assessment: 87yo male subtherapeutic on heparin; no gtt issues or signs of bleeding per RN. CBC is down trending slightly.  Goal of Therapy:  Heparin level 0.3-0.7 units/ml   Plan:  Increase heparin gtt by 2 units/kg/hr to 1050 units/hr  Check level in 8 hours.  Daily heparin and CBC level  Norina Buzzard, PharmD PGY1 Pharmacy Resident 03/06/2020 1:07 PM

## 2020-03-06 NOTE — Progress Notes (Signed)
Patient felt dizzy trying to attempt to do orthostatic vitals after having his bowel movement. Patient is currently eating dinner. Will try again prior to shift change. Will report to oncoming RN if unsuccessful.

## 2020-03-07 DIAGNOSIS — I5020 Unspecified systolic (congestive) heart failure: Secondary | ICD-10-CM | POA: Diagnosis not present

## 2020-03-07 DIAGNOSIS — I5021 Acute systolic (congestive) heart failure: Secondary | ICD-10-CM

## 2020-03-07 DIAGNOSIS — I5043 Acute on chronic combined systolic (congestive) and diastolic (congestive) heart failure: Secondary | ICD-10-CM

## 2020-03-07 DIAGNOSIS — I10 Essential (primary) hypertension: Secondary | ICD-10-CM | POA: Diagnosis not present

## 2020-03-07 DIAGNOSIS — E039 Hypothyroidism, unspecified: Secondary | ICD-10-CM | POA: Diagnosis not present

## 2020-03-07 LAB — GLUCOSE, CAPILLARY
Glucose-Capillary: 103 mg/dL — ABNORMAL HIGH (ref 70–99)
Glucose-Capillary: 127 mg/dL — ABNORMAL HIGH (ref 70–99)
Glucose-Capillary: 152 mg/dL — ABNORMAL HIGH (ref 70–99)
Glucose-Capillary: 107 mg/dL — ABNORMAL HIGH (ref 70–99)

## 2020-03-07 LAB — HEPARIN LEVEL (UNFRACTIONATED): Heparin Unfractionated: 0.31 IU/mL (ref 0.30–0.70)

## 2020-03-07 LAB — CBC
HCT: 25.4 % — ABNORMAL LOW (ref 39.0–52.0)
Hemoglobin: 8.5 g/dL — ABNORMAL LOW (ref 13.0–17.0)
MCH: 29.2 pg (ref 26.0–34.0)
MCHC: 33.5 g/dL (ref 30.0–36.0)
MCV: 87.3 fL (ref 80.0–100.0)
Platelets: 256 10*3/uL (ref 150–400)
RBC: 2.91 MIL/uL — ABNORMAL LOW (ref 4.22–5.81)
RDW: 15.3 % (ref 11.5–15.5)
WBC: 8.5 10*3/uL (ref 4.0–10.5)
nRBC: 0 % (ref 0.0–0.2)

## 2020-03-07 LAB — BASIC METABOLIC PANEL
Anion gap: 13 (ref 5–15)
BUN: 64 mg/dL — ABNORMAL HIGH (ref 8–23)
CO2: 22 mmol/L (ref 22–32)
Calcium: 8.8 mg/dL — ABNORMAL LOW (ref 8.9–10.3)
Chloride: 101 mmol/L (ref 98–111)
Creatinine, Ser: 3.88 mg/dL — ABNORMAL HIGH (ref 0.61–1.24)
GFR, Estimated: 14 mL/min — ABNORMAL LOW (ref >60)
Glucose, Bld: 105 mg/dL — ABNORMAL HIGH (ref 70–99)
Potassium: 3.7 mmol/L (ref 3.5–5.1)
Sodium: 136 mmol/L (ref 135–145)

## 2020-03-07 MED ORDER — FUROSEMIDE 10 MG/ML IJ SOLN
40.0000 mg | Freq: Two times a day (BID) | INTRAMUSCULAR | Status: DC
  Administered 2020-03-08: 40 mg via INTRAVENOUS
  Filled 2020-03-07 (×2): qty 4

## 2020-03-07 MED ORDER — POTASSIUM CHLORIDE CRYS ER 20 MEQ PO TBCR
40.0000 meq | EXTENDED_RELEASE_TABLET | Freq: Once | ORAL | Status: AC
  Administered 2020-03-07: 40 meq via ORAL
  Filled 2020-03-07: qty 2

## 2020-03-07 MED ORDER — HEPARIN SODIUM (PORCINE) 5000 UNIT/ML IJ SOLN
5000.0000 [IU] | Freq: Three times a day (TID) | INTRAMUSCULAR | Status: DC
  Administered 2020-03-07 – 2020-03-10 (×9): 5000 [IU] via SUBCUTANEOUS
  Filled 2020-03-07 (×10): qty 1

## 2020-03-07 NOTE — Progress Notes (Signed)
ANTICOAGULATION CONSULT NOTE - Follow Up Consult  Pharmacy Consult for heparin Indication: NSTEMI  Labs: Recent Labs    03/04/20 0937 03/04/20 1120 03/04/20 2307 03/05/20 0425 03/06/20 0251 03/06/20 1159 03/06/20 2141 03/07/20 0324  HGB 10.6*  --   --  9.1* 8.7*  --   --  8.5*  HCT 33.5*  --   --  27.0* 26.6*  --   --  25.4*  PLT 259  --   --  265 240  --   --  256  HEPARINUNFRC  --   --    < > 0.34 0.14* 0.22* 0.28* 0.31  CREATININE 3.29*  --   --  3.37* 3.63*  --   --  3.88*  TROPONINIHS 344* 760*  --   --   --   --   --   --    < > = values in this interval not displayed.   Heparin DW: 63kg  Assessment: 87yo M on heparin for ACS rule out with chronically elevated troponins.   HL 0.31 therapeutic. Not checking troponins. Consider stopping heparin if no further ACS work up, MD agreed.  Goal of Therapy:  Heparin level 0.3-0.7 units/ml   Plan:  Stop heparin gtt    Benetta Spar, PharmD, BCPS, [facility] Clinical Pharmacist  Please check AMION for all [place] phone numbers After 10:00 PM, call [place] [phone]

## 2020-03-07 NOTE — Progress Notes (Signed)
Physical Therapy Treatment Patient Details Name: Henry [name] MRN: [id number] DOB: August 07, 1924 Today's Date: 03/07/2020    History of Present Illness Henry [name] is a 87 y.o. male with a hx of CAD status post CABG with graft failure, moderate to severe aortic stenosis, not a candidate for TAVR, chronic combined CHF, peripheral arterial disease, chronic kidney disease stage IV with failed dialysis access, hypertension, diabetes mellitus, hyperlipidemia second-degree type I AV block, and orthostatic intolerance who is being seen today for the evaluation of CHF    PT Comments    Pt progressing towards his physical therapy goals; reports continued lightheadedness, but was still able to participate fully in therapy session (BP stable upon assessment). Pt ambulating 120 feet with a walker at a min guard assist level. SpO2 > 90% on 3L O2. Pt displays decreased endurance, weakness, and balance deficits. Presents as a high fall risk based on decreased gait speed.   Discussed d/c recommendations with pt/pt daughter; they would like to pursue SNF at this time due to not having 24/7 supervision. CSW updated.    Follow Up Recommendations  SNF;Supervision/Assistance - 24 hour     Equipment Recommendations  None recommended by PT    Recommendations for Other Services       Precautions / Restrictions Precautions Precautions: Fall Restrictions Weight Bearing Restrictions: No    Mobility  Bed Mobility Overal bed mobility: Needs Assistance Bed Mobility: Supine to Sit     Supine to sit: Min assist     General bed mobility comments: MinA to pull trunk to upright position  Transfers Overall transfer level: Needs assistance Equipment used: Rolling walker (2 wheeled) Transfers: Sit to/from Stand Sit to Stand: Min guard         General transfer comment: min guard for safety  Ambulation/Gait Ambulation/Gait assistance: Min guard Gait Distance (Feet): 120 Feet Assistive device:  Rolling walker (2 wheeled) Gait Pattern/deviations: Step-through pattern;Decreased stride length Gait velocity: decreased   General Gait Details: Slow and steady pace, cues for activity pacing, min guard for safety   Stairs             Wheelchair Mobility    Modified Rankin (Stroke Patients Only)       Balance Overall balance assessment: Needs assistance Sitting-balance support: Feet supported Sitting balance-Leahy Scale: Good     Standing balance support: Bilateral upper extremity supported Standing balance-Leahy Scale: Poor                              Cognition Arousal/Alertness: Awake/alert Behavior During Therapy: WFL for tasks assessed/performed Overall Cognitive Status: Within Functional Limits for tasks assessed                                        Exercises General Exercises - Lower Extremity Heel Slides: Both;10 reps;Supine    General Comments        Pertinent Vitals/Pain      Home Living                      Prior Function            PT Goals (current goals can now be found in the care plan section) Acute Rehab PT Goals Patient Stated Goal: agreeable to short term rehab Potential to Achieve Goals: Fair Progress towards PT goals: Progressing toward  goals    Frequency    Min 3X/week      PT Plan Current plan remains appropriate    Co-evaluation              AM-PAC PT "6 Clicks" Mobility   Outcome Measure  Help needed turning from your back to your side while in a flat bed without using bedrails?: None Help needed moving from lying on your back to sitting on the side of a flat bed without using bedrails?: A Little Help needed moving to and from a bed to a chair (including a wheelchair)?: A Little Help needed standing up from a chair using your arms (e.g., wheelchair or bedside chair)?: A Little Help needed to walk in hospital room?: A Little Help needed climbing 3-5 steps with a  railing? : A Lot 6 Click Score: 18    End of Session Equipment Utilized During Treatment: Gait belt;Oxygen Activity Tolerance: Patient tolerated treatment well Patient left: in chair;with call bell/phone within reach;with family/visitor present Nurse Communication: Mobility status PT Visit Diagnosis: Unsteadiness on feet (R26.81);Other abnormalities of gait and mobility (R26.89);Muscle weakness (generalized) (M62.81)     Time: 8938-1017 PT Time Calculation (min) (ACUTE ONLY): 32 min  Charges:  $Therapeutic Activity: 23-37 mins                     Wyona Almas, PT, DPT Acute Rehabilitation Services Pager [phone] Office [phone]    Deno Etienne 03/07/2020, 12:15 PM

## 2020-03-07 NOTE — Progress Notes (Signed)
PROGRESS NOTE    TEEGAN BRANDIS  QPR:[id number] DOB: [birth year] DOA: 03/04/2020 PCP: [name], MD   Brief Narrative:  HPI on 03/05/2019 by Dr. Fuller Plan Henry [name] is a 87 y.o. male with medical history significant of combined systolic and diastolic CHF last EF 35- 99%, moderate to severe AR, CAD s/p CABG, PAD, HTN, HLD, DM type II, CKD stage IV presents with complaints of being unable to unable to breathe over the last week, but acutely worse over the last 2 days.  Reports that with any kind of activity he has been getting more short of breath and has been very  fatigue.  Complains of having chest pressure feeling that makes it difficult for him to take a deep breathe in.  He has been taking Lasix as prescribed and still is able to make urine.  Normally he is not on oxygen at baseline.  Patient has a DNR on arrival, but states that he would like to be a full code.  patient reports that his wife is in the memory care unit and declining and he would like to be the one to bury her if possible.   Interim history Admitted for CHF exacerbation. Cardiology consulted and on IV lasix.  Assessment & Plan   Acute respiratory failure secondary to acute systolic congestive heart failure -Patient presented with shortness of breath and was found to be hypoxic down to 86% on room air -Noted to have severe faint crackles on physical exam on admission and required 6 L of nasal cannula.  Currently on high flow nasal cannula. -Chest x-ray showed interstitial opacities -BNP 808 -Patient placed on IV Lasix 80 mg twice daily- being decreased to 40mg  IV BID by cardiology -Echocardiogram EF 30 to 35%, grade 1 diastolic dysfunction.  Moderate aortic stenosis -Cardiology consulted and appreciate -Monitor intake and output, daily weights -Patient does admit to dietary indiscretion and states he has been eating more salt and drinking more water lately -Will order home desaturation/O2 screen for possible  needs  Elevated troponin/NSTEMI -Patient reports having chest pressure on admission -High-sensitivity troponin 344, 760 (although patient appears to chronically have an elevated troponin) -History of CABG -Not on a beta-blocker due to history of bradycardia -Was placed on IV heparin- will transition to SQ today -Continue aspirin and statin  Dizziness -Likely multifactorial including chronic anemia versus diuresis -Will continue to monitor closely  CKD, stage IV -Patient follows with nephrology, Dr. Marval Regal as an outpatient -Slight bump in creatinine today, to 3.88 (cardiology reducing dose of IV lasix today) -Continue to monitor BMP closely given diuresis  Essential hypertension -Continue amlodipine, Lasix  Anemia of chronic disease -hemoglobin 8.5, appears to be close to baseline (7-9) -Patient has been receiving darbepoetin alpha every 30 days as well as on iron supplementation -Hemoglobin currently stable -monitor CBC  Hypothyroidism -TSH 6.507- would recommend that thyroid function be retested in 4-6 weeks after acute illness/CHF exac has resolved -Continue Synthroid  Hyperlipidemia -Continue statin  Diabetes mellitus, type -Last hemoglobin A1c was 5.7 on 12/03/2019 -Currently not on any diabetic medications, likely diet controlled -Continue to monitor  Goals of care -Patient is a DNR  Weakness/deconditioning -Suspect secondary to the above -PT and OT evaluated patient and feel patient may be home health appropriate but may need supervision, will continue to work with patient  Hypokalemia -Resolved with supplementation however potassium still 3.7, will supplement to keep around 4 if possible  DVT Prophylaxis  Heparin  Code Status: DNR  Family Communication: None at bedside.  Daughter via phone on 03/06/2020  Disposition Plan:  Status is: Inpatient  Remains inpatient appropriate because:IV treatments appropriate due to intensity of illness or inability to  take PO   Dispo: The patient is from: Home              Anticipated d/c is to: Home              Anticipated d/c date is: > 3 days              Patient currently is not medically stable to d/c.   Consultants Cardiology  Procedures  Echocardiogram  Antibiotics   Anti-infectives (From admission, onward)   None      Subjective:   Honor Loh seen and examined today.  Continues to complain of feeling weak however states that he is no longer feeling dizzy this morning.  Feels breathing is improved but not back to baseline.  Denies current chest pain.  Denies abdominal pain, nausea or vomiting, diarrhea or constipation, dizziness or headache.   Objective:   Vitals:   03/06/20 1723 03/06/20 2000 03/07/20 0043 03/07/20 0400  BP: (!) 149/92   131/64  Pulse:    70  Resp:    20  Temp:  (!) (P) 97.5 F (36.4 C)  (!) 97 F (36.1 C)  TempSrc:  (P) Oral  Oral  SpO2:    99%  Weight:   61.5 kg   Height:        Intake/Output Summary (Last 24 hours) at 03/07/2020 1150 Last data filed at 03/07/2020 0448 Gross per 24 hour  Intake 240 ml  Output 1601 ml  Net -1361 ml   Filed Weights   03/05/20 0400 03/06/20 0344 03/07/20 0043  Weight: 66.3 kg 65.4 kg 61.5 kg   Exam  General: Well developed, elderly, chronically ill-appearing, NAD  HEENT: NCAT, mucous membranes moist.   Cardiovascular: S1 S2 auscultated, 2/6 SEM, RRR  Respiratory: Diminished breath sounds however clear  Abdomen: Soft, nontender, nondistended, + bowel sounds  Extremities: warm dry without cyanosis clubbing or edema  Neuro: AAOx3, nonfocal  Psych: appropriate mood and affect, pleasant  Data Reviewed: I have personally reviewed following labs and imaging studies  CBC: Recent Labs  Lab 03/04/20 0937 03/05/20 0425 03/06/20 0251 03/07/20 0324  WBC 10.2 9.1 7.8 8.5  NEUTROABS 7.7  --   --   --   HGB 10.6* 9.1* 8.7* 8.5*  HCT 33.5* 27.0* 26.6* 25.4*  MCV 90.3 86.8 87.8 87.3  PLT 259 265 240 124    Basic Metabolic Panel: Recent Labs  Lab 03/04/20 0937 03/05/20 0425 03/06/20 0251 03/07/20 0324  NA 134* 136 136 136  K 3.8 3.5 3.3* 3.7  CL 103 103 102 101  CO2 19* 21* 22 22  GLUCOSE 116* 127* 107* 105*  BUN 47* 50* 56* 64*  CREATININE 3.29* 3.37* 3.63* 3.88*  CALCIUM 9.0 8.8* 8.9 8.8*   GFR: Estimated Creatinine Clearance: 10.3 mL/min (A) (by C-G formula based on SCr of 3.88 mg/dL (H)). Liver Function Tests: Recent Labs  Lab 03/04/20 0937  AST 28  ALT 15  ALKPHOS 71  BILITOT 0.8  PROT 7.1  ALBUMIN 3.5   No results for input(s): LIPASE, AMYLASE in the last 168 hours. No results for input(s): AMMONIA in the last 168 hours. Coagulation Profile: No results for input(s): INR, PROTIME in the last 168 hours. Cardiac Enzymes: No results for input(s): CKTOTAL, CKMB, CKMBINDEX, TROPONINI in the  last 168 hours. BNP (last 3 results) No results for input(s): PROBNP in the last 8760 hours. HbA1C: Recent Labs    03/05/20 0425  HGBA1C 5.6   CBG: Recent Labs  Lab 03/06/20 1112 03/06/20 1607 03/06/20 2216 03/07/20 0613 03/07/20 1143  GLUCAP 167* 120* 145* 103* 127*   Lipid Profile: No results for input(s): CHOL, HDL, LDLCALC, TRIG, CHOLHDL, LDLDIRECT in the last 72 hours. Thyroid Function Tests: No results for input(s): TSH, T4TOTAL, FREET4, T3FREE, THYROIDAB in the last 72 hours. Anemia Panel: No results for input(s): VITAMINB12, FOLATE, FERRITIN, TIBC, IRON, RETICCTPCT in the last 72 hours. Urine analysis:    Component Value Date/Time   COLORURINE YELLOW 02/12/2019 1215   APPEARANCEUR CLEAR 02/12/2019 1215   LABSPEC 1.015 02/12/2019 1215   PHURINE 6.5 02/12/2019 1215   GLUCOSEU NEGATIVE 02/12/2019 1215   HGBUR NEGATIVE 02/12/2019 1215   [place] 02/12/2019 1215   KETONESUR NEGATIVE 02/12/2019 1215   PROTEINUR 100 (A) 04/10/2012 1929   UROBILINOGEN 0.2 02/12/2019 1215   NITRITE NEGATIVE 02/12/2019 1215   LEUKOCYTESUR NEGATIVE 02/12/2019 1215    Sepsis Labs: @LABRCNTIP (procalcitonin:4,lacticidven:4)  ) Recent Results (from the past 240 hour(s))  Resp Panel by RT-PCR (Flu A&B, Covid) Nasopharyngeal Swab     Status: None   Collection Time: 03/04/20  9:37 AM   Specimen: Nasopharyngeal Swab; Nasopharyngeal(NP) swabs in vial transport medium  Result Value Ref Range Status   SARS Coronavirus 2 by RT PCR NEGATIVE NEGATIVE Final    Comment: (NOTE) SARS-CoV-2 target nucleic acids are NOT DETECTED.  The SARS-CoV-2 RNA is generally detectable in upper respiratory specimens during the acute phase of infection. The lowest concentration of SARS-CoV-2 viral copies this assay can detect is 138 copies/mL. A negative result does not preclude SARS-Cov-2 infection and should not be used as the sole basis for treatment or other patient management decisions. A negative result may occur with  improper specimen collection/handling, submission of specimen other than nasopharyngeal swab, presence of viral mutation(s) within the areas targeted by this assay, and inadequate number of viral copies(<138 copies/mL). A negative result must be combined with clinical observations, patient history, and epidemiological information. The expected result is Negative.  Fact Sheet for Patients:  EntrepreneurPulse.com.au  Fact Sheet for Healthcare Providers:  IncredibleEmployment.be  This test is no t yet approved or cleared by the [place] FDA and  has been authorized for detection and/or diagnosis of SARS-CoV-2 by FDA under an Emergency Use Authorization (EUA). This EUA will remain  in effect (meaning this test can be used) for the duration of the COVID-19 declaration under Section 564(b)(1) of the Act, 21 U.S.C.section 360bbb-3(b)(1), unless the authorization is terminated  or revoked sooner.       Influenza A by PCR NEGATIVE NEGATIVE Final   Influenza B by PCR NEGATIVE NEGATIVE Final    Comment: (NOTE) The  Xpert Xpress SARS-CoV-2/FLU/RSV plus assay is intended as an aid in the diagnosis of influenza from Nasopharyngeal swab specimens and should not be used as a sole basis for treatment. Nasal washings and aspirates are unacceptable for Xpert Xpress SARS-CoV-2/FLU/RSV testing.  Fact Sheet for Patients: EntrepreneurPulse.com.au  Fact Sheet for Healthcare Providers: IncredibleEmployment.be  This test is not yet approved or cleared by the [place] FDA and has been authorized for detection and/or diagnosis of SARS-CoV-2 by FDA under an Emergency Use Authorization (EUA). This EUA will remain in effect (meaning this test can be used) for the duration of the COVID-19 declaration under Section 564(b)(1) of the  Act, 21 U.S.C. section 360bbb-3(b)(1), unless the authorization is terminated or revoked.  Performed at [facility], [place] [address], [place], [place] [address]       Radiology Studies: No results found.   Scheduled Meds: . amLODipine  10 mg Oral QPM  . aspirin EC  81 mg Oral Daily  . atorvastatin  40 mg Oral QPM  . calcitRIOL  0.25 mcg Oral Weekly  . calcium acetate  667 mg Oral BID WC  . furosemide  40 mg Intravenous BID  . heparin injection (subcutaneous)  5,000 Units Subcutaneous Q8H  . levothyroxine  50 mcg Oral QAC breakfast  . sodium chloride flush  3 mL Intravenous Q12H   Continuous Infusions: . sodium chloride       LOS: 3 days   Time Spent in minutes   30 minutes  Daanish Copes D.O. on 03/07/2020 at 11:50 AM  Between 7am to 7pm - Please see pager noted on [email or web address]  After 7pm go to [email or web address]  And look for the night coverage person covering for me after hours  [facility] Office  [phone]

## 2020-03-07 NOTE — NC FL2 (Signed)
[place] LEVEL OF CARE SCREENING TOOL     IDENTIFICATION  Patient Name: Henry [name] Birthdate: January 24, 1925 Sex: male Admission Date (Current Location): 03/04/2020  [facility] and [place] Number:  Herbalist and Address:  [facility], [place] [address] [place], [place] [address]      Provider Number: [id number]  Attending Physician Name and Address:  Cristal Ford, DO  Relative Name and Phone Number:  Casilda Carls (Daughter)   [phone] [facility])    Current Level of Care: Hospital Recommended Level of Care: [place] Prior Approval Number:    Date Approved/Denied:   PASRR Number: [id number]  Discharge Plan: SNF    Current Diagnoses: Patient Active Problem List   Diagnosis Date Noted  . Systolic congestive heart failure ([place]) 03/04/2020  . Chronic hypoxemic respiratory failure ([place]) 12/03/2019  . Goals of care, counseling/discussion   . Palliative care by specialist   . Encounter for hospice care discussion   . Acute on chronic congestive heart failure ([place]) 11/09/2019  . DNR (do not resuscitate) 11/09/2019  . Mobitz type 1 second degree atrioventricular block 05/02/2018  . Chronic diastolic CHF (congestive heart failure) ([place])   . NSTEMI 02/2018 - 2/4 grafts occluded; Med Rx   . Respiratory failure with hypoxia ([place]) 03/25/2018  . Pulmonary edema 03/25/2018  . Respiratory failure, acute ([place]) 03/25/2018  . Elevated troponin 03/25/2018  . Vitamin D deficiency 01/01/2018  . CKD (chronic kidney disease) stage 5, GFR less than 15 ml/min (HCC) 07/24/2017  . Carotid artery disease ([place])   . Hypertension   . PVD (peripheral vascular disease) with claudication ([place])   . Peripheral vascular disease with claudication ([place]) 01/22/2017  . Aortic valve stenosis, unspecified etiology 01/22/2017  . Postinflammatory pulmonary fibrosis ([place]) 07/31/2016  . Acute bronchitis 03/07/2016  . Hypothyroidism 08/31/2014   . Carotid stenosis 08/19/2012  . DJD (degenerative joint disease) 08/19/2012  . LBP (low back pain) 08/19/2012  . Cerebral atrophy ([place]) 08/19/2012  . Dizziness 04/13/2012  . Diabetes ([place]) 04/13/2012  . Nausea and vomiting 04/10/2012  . CAD (coronary artery disease) 07/13/2008  . HYPERCHOLESTEROLEMIA 04/04/2007  . Anemia of chronic disease 04/04/2007    Orientation RESPIRATION BLADDER Height & Weight     Self,Time,Situation,Place  O2 (4L [place]) External catheter Weight: 135 lb 9.3 oz (61.5 kg) Height:  5\' 9"  (175.3 cm)  BEHAVIORAL SYMPTOMS/MOOD NEUROLOGICAL BOWEL NUTRITION STATUS      Continent Diet (see d/c summary)  AMBULATORY STATUS COMMUNICATION OF NEEDS Skin   Extensive Assist Verbally Normal                       Personal Care Assistance Level of Assistance  Bathing,Feeding,Dressing Bathing Assistance: Limited assistance Feeding assistance: Independent Dressing Assistance: Limited assistance     Functional Limitations Info  Sight,Speech,Hearing Sight Info: Impaired Hearing Info: Adequate Speech Info: Adequate    SPECIAL CARE FACTORS FREQUENCY  PT (By licensed PT),OT (By licensed OT)     PT Frequency: 5x/week OT Frequency: 5x/week            Contractures Contractures Info: Not present    Additional Factors Info  Code Status,Allergies Code Status Info: DNR Allergies Info: No known allergies           Current Medications (03/07/2020):  This is the current hospital active medication list Current Facility-Administered Medications  Medication Dose Route Frequency Provider Last Rate Last Admin  . 0.9 %  sodium chloride  infusion  250 mL Intravenous PRN Fuller Plan A, MD      . acetaminophen (TYLENOL) tablet 650 mg  650 mg Oral Q4H PRN Smith, Rondell A, MD      . amLODipine (NORVASC) tablet 10 mg  10 mg Oral QPM Smith, Rondell A, MD   10 mg at 03/06/20 1723  . aspirin EC tablet 81 mg  81 mg Oral Daily Fuller Plan A, MD   81 mg at 03/07/20 0973   . atorvastatin (LIPITOR) tablet 40 mg  40 mg Oral QPM Smith, Rondell A, MD   40 mg at 03/06/20 1723  . calcitRIOL (ROCALTROL) capsule 0.25 mcg  0.25 mcg Oral Weekly Tamala Julian, Rondell A, MD   0.25 mcg at 03/07/20 5329  . calcium acetate (PHOSLO) capsule 667 mg  667 mg Oral BID WC Smith, Rondell A, MD   667 mg at 03/07/20 9242  . furosemide (LASIX) injection 40 mg  40 mg Intravenous BID Kathyrn Drown D, NP      . heparin injection 5,000 Units  5,000 Units Subcutaneous Q8H Mikhail, [place], DO      . levothyroxine (SYNTHROID) tablet 50 mcg  50 mcg Oral QAC breakfast Fuller Plan A, MD   50 mcg at 03/07/20 6834  . ondansetron (ZOFRAN) injection 4 mg  4 mg Intravenous Q6H PRN Smith, Rondell A, MD      . polyvinyl alcohol (LIQUIFILM TEARS) 1.4 % ophthalmic solution 1 drop  1 drop Both Eyes Daily PRN Smith, Rondell A, MD      . potassium chloride SA (KLOR-CON) CR tablet 40 mEq  40 mEq Oral Once Cristal Ford, DO      . sodium chloride flush (NS) 0.9 % injection 3 mL  3 mL Intravenous Q12H Smith, Rondell A, MD   3 mL at 03/07/20 0823  . sodium chloride flush (NS) 0.9 % injection 3 mL  3 mL Intravenous PRN Norval Morton, MD         Discharge Medications: Please see discharge summary for a list of discharge medications.  Relevant Imaging Results:  Relevant Lab Results:   Additional Information SSN [place] Indira Sorenson, LCSW

## 2020-03-07 NOTE — Progress Notes (Addendum)
Progress Note  Patient Name: Henry [name] Date of Encounter: 03/07/2020  Primary Cardiologist: Dr. Sherren Mocha, MD   Subjective   Doing well this AM. Reports that he is weak however no recurrent chest pain. Creatinine rising   Inpatient Medications    Scheduled Meds: . amLODipine  10 mg Oral QPM  . aspirin EC  81 mg Oral Daily  . atorvastatin  40 mg Oral QPM  . calcitRIOL  0.25 mcg Oral Weekly  . calcium acetate  667 mg Oral BID WC  . furosemide  80 mg Intravenous BID  . heparin injection (subcutaneous)  5,000 Units Subcutaneous Q8H  . levothyroxine  50 mcg Oral QAC breakfast  . sodium chloride flush  3 mL Intravenous Q12H   Continuous Infusions: . sodium chloride     PRN Meds: sodium chloride, acetaminophen, ondansetron (ZOFRAN) IV, polyvinyl alcohol, sodium chloride flush   Vital Signs    Vitals:   03/06/20 1723 03/06/20 2000 03/07/20 0043 03/07/20 0400  BP: (!) 149/92   131/64  Pulse:    70  Resp:    20  Temp:  (!) (P) 97.5 F (36.4 C)  (!) 97 F (36.1 C)  TempSrc:  (P) Oral  Oral  SpO2:    99%  Weight:   61.5 kg   Height:        Intake/Output Summary (Last 24 hours) at 03/07/2020 1002 Last data filed at 03/07/2020 0448 Gross per 24 hour  Intake 240 ml  Output 1601 ml  Net -1361 ml   Filed Weights   03/05/20 0400 03/06/20 0344 03/07/20 0043  Weight: 66.3 kg 65.4 kg 61.5 kg   Physical Exam   General: Appears younger than stated age, NAD Neck: Negative for carotid bruits. No JVD Lungs: Bilateral upper and lower lobes crackles. Breathing is unlabored. Cardiovascular: RRR with S1 S2. +AS murmur Abdomen: Soft, non-tender, non-distended. No obvious abdominal masses. Extremities: No edema. Radial pulses 2+ bilaterally Neuro: Alert and oriented. No focal deficits. No facial asymmetry. MAE spontaneously. Psych: Responds to questions appropriately with normal affect.    Labs    Chemistry Recent Labs  Lab 03/04/20 0937 03/05/20 0425  03/06/20 0251 03/07/20 0324  NA 134* 136 136 136  K 3.8 3.5 3.3* 3.7  CL 103 103 102 101  CO2 19* 21* 22 22  GLUCOSE 116* 127* 107* 105*  BUN 47* 50* 56* 64*  CREATININE 3.29* 3.37* 3.63* 3.88*  CALCIUM 9.0 8.8* 8.9 8.8*  PROT 7.1  --   --   --   ALBUMIN 3.5  --   --   --   AST 28  --   --   --   ALT 15  --   --   --   ALKPHOS 71  --   --   --   BILITOT 0.8  --   --   --   GFRNONAA 17* 16* 15* 14*  ANIONGAP 12 12 12 13      Hematology Recent Labs  Lab 03/05/20 0425 03/06/20 0251 03/07/20 0324  WBC 9.1 7.8 8.5  RBC 3.11* 3.03* 2.91*  HGB 9.1* 8.7* 8.5*  HCT 27.0* 26.6* 25.4*  MCV 86.8 87.8 87.3  MCH 29.3 28.7 29.2  MCHC 33.7 32.7 33.5  RDW 15.5 15.5 15.3  PLT 265 240 256    Cardiac EnzymesNo results for input(s): TROPONINI in the last 168 hours. No results for input(s): TROPIPOC in the last 168 hours.   BNP Recent Labs  Lab 03/04/20 0937  BNP 808.0*     DDimer No results for input(s): DDIMER in the last 168 hours.   Radiology    ECHOCARDIOGRAM COMPLETE  Result Date: 03/05/2020    ECHOCARDIOGRAM REPORT   Patient Name:   [name] [facility] Date of Exam: 03/05/2020 Medical Rec #:  [id number]        Height:       69.0 in Accession #:    [id number]       Weight:       146.2 lb Date of Birth:  Jun 26, 1924         BSA:          1.808 m Patient Age:    14 years         BP:           137/62 mmHg Patient Gender: M                HR:           86 bpm. Exam Location:  Inpatient Procedure: 2D Echo, Cardiac Doppler and Color Doppler Indications:    CHF-Acute Systolic  History:        Patient has prior history of Echocardiogram examinations, most                 recent 11/10/2019. Previous Myocardial Infarction and CAD, Aortic                 Valve Disease, Signs/Symptoms:Shortness of Breath; Risk                 Factors:Hypertension, Dyslipidemia and Diabetes. CKD.  Sonographer:    [name] Lefort RDCS (AE) Referring Phys: [id number] RONDELL A SMITH IMPRESSIONS  1. Left ventricular ejection  fraction, by estimation, is 30 to 35%. The left ventricle has moderately decreased function. The left ventricle demonstrates global hypokinesis. Left ventricular diastolic parameters are consistent with Grade I diastolic dysfunction (impaired relaxation).  2. Right ventricular systolic function is normal. The right ventricular size is normal. There is mildly elevated pulmonary artery systolic pressure.  3. Left atrial size was moderately dilated.  4. The mitral valve is normal in structure. Mild mitral valve regurgitation. No evidence of mitral stenosis.  5. Mean gradient is consistent with mild aortic stenosis, unchanged form 10/2019. However, dimensionless index is 0.24. This is decreased from 0.26 on 10/2019. Consistent with at least moderate aortic stenosis. Cannot exclude low-flow, low-gradient aortic  stenosis. The aortic valve is calcified. There is moderate calcification of the aortic valve. There is moderate thickening of the aortic valve. Aortic valve regurgitation is mild. Moderate to severe aortic valve stenosis. Aortic valve area, by VTI measures 0.76 cm. Aortic valve mean gradient measures 17.0 mmHg. Aortic valve Vmax measures 2.68 m/s.  6. The inferior vena cava is normal in size with <50% respiratory variability, suggesting right atrial pressure of 8 mmHg. FINDINGS  Left Ventricle: Left ventricular ejection fraction, by estimation, is 30 to 35%. The left ventricle has moderately decreased function. The left ventricle demonstrates global hypokinesis. The left ventricular internal cavity size was normal in size. There is no left ventricular hypertrophy. Left ventricular diastolic parameters are consistent with Grade I diastolic dysfunction (impaired relaxation). Right Ventricle: The right ventricular size is normal. No increase in right ventricular wall thickness. Right ventricular systolic function is normal. There is mildly elevated pulmonary artery systolic pressure. The tricuspid regurgitant  velocity is 2.82  m/s, and with an assumed right atrial pressure of 8 mmHg, the  estimated right ventricular systolic pressure is 38.4 mmHg. Left Atrium: Left atrial size was moderately dilated. Right Atrium: Right atrial size was normal in size. Pericardium: There is no evidence of pericardial effusion. Mitral Valve: The mitral valve is normal in structure. Mild mitral valve regurgitation. No evidence of mitral valve stenosis. MV peak gradient, 10.6 mmHg. The mean mitral valve gradient is 4.0 mmHg. Tricuspid Valve: The tricuspid valve is normal in structure. Tricuspid valve regurgitation is mild . No evidence of tricuspid stenosis. Aortic Valve: Mean gradient is consistent with mild aortic stenosis, unchanged form 10/2019. However, dimensionless index is 0.24. This is decreased from 0.26 on 10/2019. Consistent with at least moderate aortic stenosis. Cannot exclude low-flow, low-gradient aortic stenosis. The aortic valve is calcified. There is moderate calcification of the aortic valve. There is moderate thickening of the aortic valve. Aortic valve regurgitation is mild. Aortic regurgitation PHT measures 240 msec. Moderate to severe aortic stenosis is present. Aortic valve mean gradient measures 17.0 mmHg. Aortic valve peak gradient measures 28.6 mmHg. Aortic valve area, by VTI measures 0.76 cm. Pulmonic Valve: The pulmonic valve was normal in structure. Pulmonic valve regurgitation is not visualized. No evidence of pulmonic stenosis. Aorta: The aortic root is normal in size and structure. Venous: The inferior vena cava is normal in size with less than 50% respiratory variability, suggesting right atrial pressure of 8 mmHg. IAS/Shunts: No atrial level shunt detected by color flow Doppler.  LEFT VENTRICLE PLAX 2D LVIDd:         4.60 cm  Diastology LVIDs:         3.90 cm  LV e' medial:    2.03 cm/s LV PW:         1.20 cm  LV E/e' medial:  57.1 LV IVS:        1.30 cm  LV e' lateral:   6.45 cm/s LVOT diam:     2.00 cm  LV  E/e' lateral: 18.0 LV SV:         43 LV SV Index:   24 LVOT Area:     3.14 cm                          3D Volume EF:                         3D EF:        32 %                         LV EDV:       187 ml                         LV ESV:       127 ml                         LV SV:        60 ml RIGHT VENTRICLE            IVC RV Basal diam:  2.60 cm    IVC diam: 1.40 cm RV S prime:     9.75 cm/s TAPSE (M-mode): 1.9 cm LEFT ATRIUM             Index       RIGHT ATRIUM           Index  LA diam:        4.00 cm 2.21 cm/m  RA Area:     14.20 cm LA Vol (A2C):   55.9 ml 30.91 ml/m RA Volume:   30.30 ml  16.76 ml/m LA Vol (A4C):   62.7 ml 34.67 ml/m LA Biplane Vol: 61.1 ml 33.79 ml/m  AORTIC VALVE AV Area (Vmax):    0.84 cm AV Area (Vmean):   0.75 cm AV Area (VTI):     0.76 cm AV Vmax:           267.50 cm/s AV Vmean:          196.500 cm/s AV VTI:            0.568 m AV Peak Grad:      28.6 mmHg AV Mean Grad:      17.0 mmHg LVOT Vmax:         71.80 cm/s LVOT Vmean:        46.700 cm/s LVOT VTI:          0.137 m LVOT/AV VTI ratio: 0.24 AI PHT:            240 msec  AORTA Ao Root diam: 3.80 cm Ao Asc diam:  3.80 cm MITRAL VALVE                TRICUSPID VALVE MV Area (PHT): 5.97 cm     TR Peak grad:   31.8 mmHg MV Peak grad:  10.6 mmHg    TR Vmax:        282.00 cm/s MV Mean grad:  4.0 mmHg MV Vmax:       1.63 m/s     SHUNTS MV Vmean:      87.6 cm/s    Systemic VTI:  0.14 m MV Decel Time: 127 msec     Systemic Diam: 2.00 cm MV E velocity: 116.00 cm/s MV A velocity: 143.00 cm/s MV E/A ratio:  0.81 Skeet Latch MD Electronically signed by Skeet Latch MD Signature Date/Time: 03/05/2020/3:16:34 PM    Final    Telemetry    03/07/20 NSR- Personally Reviewed  ECG    No new tracing as of 03/07/20 - Personally Reviewed  Cardiac Studies   Echo 10/2019 1. Left ventricular ejection fraction, by estimation, is 35 to 40%. The  left ventricle has moderately decreased function. The left ventricle  demonstrates global  hypokinesis. Left ventricular diastolic function could  not be evaluated.  2. Right ventricular systolic function is mildly reduced. The right  ventricular size is normal. There is normal pulmonary artery systolic  pressure.  3. Left atrial size was mildly dilated.  4. The mitral valve is normal in structure. Mild mitral valve  regurgitation. No evidence of mitral stenosis.  5. The aortic valve is tricuspid. There is severe calcifcation of the  aortic valve. There is moderate thickening of the aortic valve. Aortic  valve regurgitation is moderate. Moderate aortic valve stenosis. Aortic  valve area, by VTI measures 0.92 cm.  Aortic valve mean gradient measures 18.0 mmHg. Aortic valve Vmax measures  2.87 m/s. The dimensionless index is low at 0.26 and consistent with more  severe AS. SV index is reduced at 31. The AV mean gradient is likely  underestimated in the setting of LV  dysfunction and this likely represents at least moderate to severe low  flow low gradient aortic stenosis.  6. Aortic dilatation noted. There is borderline dilatation of the aortic  root, measuring 37 mm. There is dilatation of  the ascending aorta,  measuring 38 mm.  7. The inferior vena cava is normal in size with greater than 50%  respiratory variability, suggesting right atrial pressure of 3 mmHg.   Echocardiogram 03/05/20:  1. Left ventricular ejection fraction, by estimation, is 30 to 35%. The  left ventricle has moderately decreased function. The left ventricle  demonstrates global hypokinesis. Left ventricular diastolic parameters are  consistent with Grade I diastolic  dysfunction (impaired relaxation).  2. Right ventricular systolic function is normal. The right ventricular  size is normal. There is mildly elevated pulmonary artery systolic  pressure.  3. Left atrial size was moderately dilated.  4. The mitral valve is normal in structure. Mild mitral valve  regurgitation. No evidence of  mitral stenosis.  5. Mean gradient is consistent with mild aortic stenosis, unchanged form  10/2019. However, dimensionless index is 0.24. This is decreased from 0.26  on 10/2019. Consistent with at least moderate aortic stenosis. Cannot  exclude low-flow, low-gradient aortic  stenosis. The aortic valve is calcified. There is moderate calcification  of the aortic valve. There is moderate thickening of the aortic valve.  Aortic valve regurgitation is mild. Moderate to severe aortic valve  stenosis. Aortic valve area, by VTI  measures 0.76 cm. Aortic valve mean gradient measures 17.0 mmHg. Aortic  valve Vmax measures 2.68 m/s.  6. The inferior vena cava is normal in size with <50% respiratory  variability, suggesting right atrial pressure of 8 mmHg.   Cath 02/2018 LEFT HEART CATH AND CORS/GRAFTS ANGIOGRAPHY    Conclusion    Ost RCA to Prox RCA lesion is 100% stenosed.  Origin to Prox Graft lesion is 100% stenosed.  Origin lesion is 100% stenosed.  Prox LAD lesion is 100% stenosed.  Ost 1st Mrg lesion is 90% stenosed.  1. Severe native 3 vessel disease with total occlusion of the RCA and LAD, severe stenosis of the left circumflex 2. S/P remote CABG with continued patency of the LIMA-LAD and SVG-diagonal, total occlusion of the SVG-RCA and SVG-PDA  Recommend medical management of CAD. No clear culprit lesion. Both SVG occlusions appear chronic. No targets for PCI.   Patient Profile     87 y.o. male with a hx of CAD status post CABG with graft failure, moderate to severe aortic stenosis, not a candidate for TAVR, chronic combined CHF, peripheral arterial disease, chronic kidney disease stage IV with failed dialysis access, hypertension, diabetes mellitus, hyperlipidemia second-degree type I AV block, and orthostatic intolerance who is being seen today for the evaluation of CHF at the request of Dr. Tamala Julian.  Assessment & Plan    1. NSTEMI with hx of CAD s/p CABG: -Pt  presented with SOB with reports of chest pressure found to have elevated HsT at 344>>760 -Last cath 02/2018 with continued patency of the LIMA-LAD and SVG-diagonal, total occlusion of the SVG-RCA and SVG-PDA with no targets for PCI at that time  -Placed on IV Heparin for ACS until decision regarding CV evaluation planned however weekend team seems to think HsT elevation likely in the setting of CHF given underlying CAD -Denies recurrent chest pain symptoms  -Continue ASA and statin  -Not on BB due to prior hx of bradycardia   2. Acute on chronic combined CHF with SOB: -Presented with SOB initially with activity which progressed to SOB with rest. He was found to be quite hypoxic on arrival. CXR with vascular congestion and and BNP at 808. -Symptoms initially felt to be multifactorial from valvular disease, CHF and  non-STEMI -Previously on PTA MWF Lasix 40mg  PO QD however was diuresed with IV Lasix 80mg  BID. -Given continued rise in creatinine and relatively stable fluid volume balance, will decrease IV Lasix dosing to 40mg  BID for now  -Weight, 135lb today with admission weight at 139lb -I&O, net negative 3.6L -Echo this admission with EF at 35-40% with global hypokinesis with G1DD, mild MR, and mod AS with a mean gradient at 91mmHg>>simliar to prior echo from 10/2019  3. CKD stage IV: -Creatinine 3.88 today>>>was 3.63>>3.37>>3.27 -Follows with OP nephrology  -Decrease IV Lasix>>previously on IV Lasix 80mg  BID   4. Hypothyroidism:  -On PTA synthroid with elevated TSH   5. HTN  -Stable, 131/64>>149/92>>137/61 -Continue amlodipine  Signed, Kathyrn Drown NP-C [facility] Pager: [phone] 03/07/2020, 10:02 AM     For questions or updates, please contact   Please consult [email or web address] for contact info under Cardiology/STEMI. As above, patient seen and examined.  His dyspnea is improving.  He denies chest pain though he continues to describe some degree of weakness.  Decrease Lasix to  40 mg IV twice daily and follow renal function.  We are trying to balance keeping patient euvolemic versus worsening renal insufficiency.  Hopefully can discharge in 24 to 48 hours.  Note patient felt not to be a candidate for TAVR.  Kirk Ruths, MD

## 2020-03-07 NOTE — TOC Initial Note (Signed)
Transition of Care [facility]) - Initial/Assessment Note    Patient Details  Name: Henry [name] MRN: [id number] Date of Birth: 09/18/1924  Transition of Care [facility]) CM/SW Contact:    Bethann Berkshire, [place] Phone Number: 03/07/2020, 1:46 PM  Clinical Narrative:                  CSW met with pt and 2 daughters bedside. Pt lives home alone with 1 child who lives locally. Pt and family are wanting SNF. They are agreeable to workup. Daughters and pt are specifically requesting [facility]. CSW explained that he was notified today that [facility] is not taking admissions currently. Family still insisted CSW reach out to [facility]. They are otherwise agreeable to SNF workup and will discuss options once they have a list of bed offers. CSW completed FL2 and faxed bed requests in hub. CSW contact Altha Harm from [facility] who informed CSW "we aren't taking any admissions right now" when CSW inquired about possible beds later this week.  Expected Discharge Plan: Skilled Nursing Facility Barriers to Discharge: Continued Medical Work up   Patient Goals and CMS Choice Patient states their goals for this hospitalization and ongoing recovery are:: Pt wants rehab facility CMS Medicare.gov Compare Post Acute Care list provided to:: Patient Choice offered to / list presented to : [place]  Expected Discharge Plan and Services Expected Discharge Plan: [place]       Living arrangements for the past 2 months: Single Family Home                                      Prior Living Arrangements/Services Living arrangements for the past 2 months: Single Family Home Lives with:: Self Patient language and need for interpreter reviewed:: Yes Do you feel safe going back to the place where you live?: Yes      Need for Family Participation in Patient Care: Yes (Comment) Care giver support system in place?: Yes (comment) Current home services: Home OT,Home PT Criminal Activity/Legal  Involvement Pertinent to Current Situation/Hospitalization: No - Comment as needed  Activities of Daily Living Home Assistive Devices/Equipment: Cane (specify quad or straight),Walker (specify type) ADL Screening (condition at time of admission) Patient's cognitive ability adequate to safely complete daily activities?: Yes Is the patient deaf or have difficulty hearing?: Yes Does the patient have difficulty seeing, even when wearing glasses/contacts?: Yes Does the patient have difficulty concentrating, remembering, or making decisions?: No Patient able to express need for assistance with ADLs?: Yes Does the patient have difficulty dressing or bathing?: No Independently performs ADLs?: Yes (appropriate for developmental age) Does the patient have difficulty walking or climbing stairs?: Yes Weakness of Legs: Both Weakness of Arms/Hands: None  Permission Sought/Granted                  Emotional Assessment Appearance:: Appears stated age Attitude/Demeanor/Rapport: Engaged Affect (typically observed): Appropriate Orientation: : Oriented to Self,Oriented to  Time,Oriented to Place,Oriented to Situation Alcohol / Substance Use: Not Applicable Psych Involvement: No (comment)  Admission diagnosis:  Systolic congestive heart failure ([place]) [I50.20] Acute on chronic congestive heart failure, unspecified heart failure type [facility]) [I50.9] Patient Active Problem List   Diagnosis Date Noted  . Systolic congestive heart failure ([place]) 03/04/2020  . Chronic hypoxemic respiratory failure ([place]) 12/03/2019  . Goals of care, counseling/discussion   . Palliative care by specialist   . Encounter for hospice care  discussion   . Acute on chronic congestive heart failure ([place]) 11/09/2019  . DNR (do not resuscitate) 11/09/2019  . Mobitz type 1 second degree atrioventricular block 05/02/2018  . Chronic diastolic CHF (congestive heart failure) ([place])   . NSTEMI 02/2018 - 2/4 grafts occluded; Med Rx   .  Respiratory failure with hypoxia ([place]) 03/25/2018  . Pulmonary edema 03/25/2018  . Respiratory failure, acute ([place]) 03/25/2018  . Elevated troponin 03/25/2018  . Vitamin D deficiency 01/01/2018  . CKD (chronic kidney disease) stage 5, GFR less than 15 ml/min (HCC) 07/24/2017  . Carotid artery disease ([place])   . Hypertension   . PVD (peripheral vascular disease) with claudication ([place])   . Peripheral vascular disease with claudication ([place]) 01/22/2017  . Aortic valve stenosis, unspecified etiology 01/22/2017  . 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 Acute bronchitis 03/07/2016  . Hypothyroidism 08/31/2014  . Carotid stenosis 08/19/2012  . DJD (degenerative joint disease) 08/19/2012  . LBP (low back pain) 08/19/2012  . Cerebral atrophy ([place]) 08/19/2012  . Dizziness 04/13/2012  . Diabetes ([place]) 04/13/2012  . Nausea and vomiting 04/10/2012  . CAD (coronary artery disease) 07/13/2008  . HYPERCHOLESTEROLEMIA 04/04/2007  . Anemia of chronic disease 04/04/2007   PCP:  [name], MD Pharmacy:   [facility], [place] - [address] [place] [place] [address] Phone: [phone] Fax: [phone]     Social Determinants of Health (SDOH) Interventions    Readmission Risk Interventions No flowsheet data found.

## 2020-03-08 DIAGNOSIS — I10 Essential (primary) hypertension: Secondary | ICD-10-CM | POA: Diagnosis not present

## 2020-03-08 DIAGNOSIS — I5021 Acute systolic (congestive) heart failure: Secondary | ICD-10-CM | POA: Diagnosis not present

## 2020-03-08 DIAGNOSIS — I5023 Acute on chronic systolic (congestive) heart failure: Secondary | ICD-10-CM

## 2020-03-08 DIAGNOSIS — E039 Hypothyroidism, unspecified: Secondary | ICD-10-CM | POA: Diagnosis not present

## 2020-03-08 DIAGNOSIS — I5020 Unspecified systolic (congestive) heart failure: Secondary | ICD-10-CM | POA: Diagnosis not present

## 2020-03-08 LAB — BASIC METABOLIC PANEL
Anion gap: 12 (ref 5–15)
BUN: 71 mg/dL — ABNORMAL HIGH (ref 8–23)
CO2: 25 mmol/L (ref 22–32)
Calcium: 9.1 mg/dL (ref 8.9–10.3)
Chloride: 97 mmol/L — ABNORMAL LOW (ref 98–111)
Creatinine, Ser: 3.81 mg/dL — ABNORMAL HIGH (ref 0.61–1.24)
GFR, Estimated: 14 mL/min — ABNORMAL LOW (ref >60)
Glucose, Bld: 102 mg/dL — ABNORMAL HIGH (ref 70–99)
Potassium: 4.3 mmol/L (ref 3.5–5.1)
Sodium: 134 mmol/L — ABNORMAL LOW (ref 135–145)

## 2020-03-08 LAB — CBC
HCT: 25.9 % — ABNORMAL LOW (ref 39.0–52.0)
Hemoglobin: 8.4 g/dL — ABNORMAL LOW (ref 13.0–17.0)
MCH: 28.6 pg (ref 26.0–34.0)
MCHC: 32.4 g/dL (ref 30.0–36.0)
MCV: 88.1 fL (ref 80.0–100.0)
Platelets: 253 10*3/uL (ref 150–400)
RBC: 2.94 MIL/uL — ABNORMAL LOW (ref 4.22–5.81)
RDW: 14.9 % (ref 11.5–15.5)
WBC: 7.7 10*3/uL (ref 4.0–10.5)
nRBC: 0 % (ref 0.0–0.2)

## 2020-03-08 LAB — GLUCOSE, CAPILLARY
Glucose-Capillary: 168 mg/dL — ABNORMAL HIGH (ref 70–99)
Glucose-Capillary: 118 mg/dL — ABNORMAL HIGH (ref 70–99)
Glucose-Capillary: 171 mg/dL — ABNORMAL HIGH (ref 70–99)

## 2020-03-08 MED ORDER — ISOSORBIDE MONONITRATE ER 30 MG PO TB24
30.0000 mg | ORAL_TABLET | Freq: Every day | ORAL | Status: DC
  Administered 2020-03-08 – 2020-03-10 (×3): 30 mg via ORAL
  Filled 2020-03-08 (×3): qty 1

## 2020-03-08 MED ORDER — FUROSEMIDE 40 MG PO TABS
40.0000 mg | ORAL_TABLET | Freq: Every day | ORAL | Status: DC
  Administered 2020-03-09 – 2020-03-10 (×2): 40 mg via ORAL
  Filled 2020-03-08 (×2): qty 1

## 2020-03-08 MED ORDER — HYDRALAZINE HCL 10 MG PO TABS
10.0000 mg | ORAL_TABLET | Freq: Three times a day (TID) | ORAL | Status: DC
  Administered 2020-03-09 – 2020-03-10 (×4): 10 mg via ORAL
  Filled 2020-03-08 (×4): qty 1

## 2020-03-08 NOTE — Progress Notes (Signed)
Progress Note  Patient Name: Henry [name] Date of Encounter: 03/08/2020  [facility] Cardiologist: Sherren Mocha, MD   Subjective   No CP; dyspnea improving  Inpatient Medications    Scheduled Meds: . amLODipine  10 mg Oral QPM  . aspirin EC  81 mg Oral Daily  . atorvastatin  40 mg Oral QPM  . calcitRIOL  0.25 mcg Oral Weekly  . calcium acetate  667 mg Oral BID WC  . furosemide  40 mg Intravenous BID  . heparin injection (subcutaneous)  5,000 Units Subcutaneous Q8H  . levothyroxine  50 mcg Oral QAC breakfast  . sodium chloride flush  3 mL Intravenous Q12H   Continuous Infusions: . sodium chloride     PRN Meds: sodium chloride, acetaminophen, ondansetron (ZOFRAN) IV, polyvinyl alcohol, sodium chloride flush   Vital Signs    Vitals:   03/08/20 0459 03/08/20 0502 03/08/20 0800 03/08/20 0926  BP:  110/76 (!) 136/56   Pulse: 66 78 79 77  Resp:  20    Temp:  97.6 F (36.4 C)    TempSrc:  Oral    SpO2: 100% 99% 97% 94%  Weight:      Height:        Intake/Output Summary (Last 24 hours) at 03/08/2020 1033 Last data filed at 03/08/2020 0900 Gross per 24 hour  Intake 480 ml  Output 2600 ml  Net -2120 ml   Last 3 Weights 03/08/2020 03/07/2020 03/06/2020  Weight (lbs) 134 lb 0.6 oz 135 lb 9.3 oz 144 lb 2.9 oz  Weight (kg) 60.8 kg 61.5 kg 65.4 kg      Telemetry    Sinus with pacs and pvcs- Personally Reviewed   Physical Exam   GEN: No acute distress.   Neck: No JVD Cardiac: RRR, 3/6 systolic murmur Respiratory: Clear to auscultation bilaterally. GI: Soft, nontender, non-distended  MS: No edema Neuro:  Nonfocal  Psych: Normal affect   Labs    High Sensitivity Troponin:   Recent Labs  Lab 03/04/20 0937 03/04/20 1120  TROPONINIHS 344* 760*      Chemistry Recent Labs  Lab 03/04/20 0937 03/05/20 0425 03/06/20 0251 03/07/20 0324 03/08/20 0339  NA 134*   < > 136 136 134*  K 3.8   < > 3.3* 3.7 4.3  CL 103   < > 102 101 97*  CO2 19*   < > 22  22 25   GLUCOSE 116*   < > 107* 105* 102*  BUN 47*   < > 56* 64* 71*  CREATININE 3.29*   < > 3.63* 3.88* 3.81*  CALCIUM 9.0   < > 8.9 8.8* 9.1  PROT 7.1  --   --   --   --   ALBUMIN 3.5  --   --   --   --   AST 28  --   --   --   --   ALT 15  --   --   --   --   ALKPHOS 71  --   --   --   --   BILITOT 0.8  --   --   --   --   GFRNONAA 17*   < > 15* 14* 14*  ANIONGAP 12   < > 12 13 12    < > = values in this interval not displayed.     Hematology Recent Labs  Lab 03/06/20 0251 03/07/20 0324 03/08/20 0339  WBC 7.8 8.5 7.7  RBC 3.03* 2.91*  2.94*  HGB 8.7* 8.5* 8.4*  HCT 26.6* 25.4* 25.9*  MCV 87.8 87.3 88.1  MCH 28.7 29.2 28.6  MCHC 32.7 33.5 32.4  RDW 15.5 15.3 14.9  PLT 240 256 253    BNP Recent Labs  Lab 03/04/20 0937  BNP 808.0*     Patient Profile     87 y.o. male with a hx of CAD status post CABG with graft failure, moderate to severe aortic stenosis, not a candidate for TAVR, chronic combined CHF, peripheral arterial disease, chronic kidney disease stage IV with failed dialysis access, hypertension, diabetes mellitus, hyperlipidemia second-degree type I AV block, and orthostatic intolerancewho is being seen for the evaluation of CHF.  Echocardiogram this admission shows ejection fraction 30 to 33%, grade 1 diastolic dysfunction, moderate left atrial enlargement, mild mitral regurgitation, moderate to severe aortic stenosis, mild aortic insufficiency.  Assessment & Plan    1 acute on chronic combined systolic/diastolic congestive heart failure-volume status has improved.  Will change Lasix to 40 mg daily.  We discussed the importance of fluid restriction and low-sodium diet.  Follow potassium and renal function.  2 ischemic cardiomyopathy-we will discontinue amlodipine.  Instead we will treat with hydralazine/nitrates.  Add low-dose beta-blocker later as tolerated.  3 acute on chronic stage IV kidney disease-patient will need close follow-up of renal function.  4  coronary artery disease-continue aspirin and statin.  5 elevated troponin-likely secondary to demand ischemia.  No plans for further ischemia evaluation.  Weakness/deconditioning-agree with continued physical therapy.  For questions or updates, please contact [place] Please consult [email or web address] for contact info under        Signed, Kirk Ruths, MD  03/08/2020, 10:33 AM

## 2020-03-08 NOTE — TOC Progression Note (Signed)
Transition of Care [facility]) - Progression Note    Patient Details  Name: Henry [name] MRN: [id number] Date of Birth: 1924/03/29  Transition of Care [facility]) CM/SW [place], [place] Phone Number: 03/08/2020, 12:19 PM  Clinical Narrative:     CSW provided bed offers to daughter [name] and explained that [facility] is not taking any admissions. [name] will review with her sister and pt regarding decision.   Expected Discharge Plan: [place] Barriers to Discharge: Continued Medical Work up  Expected Discharge Plan and Services Expected Discharge Plan: [place] arrangements for the past 2 months: Single Family Home                                       Social Determinants of Health (SDOH) Interventions    Readmission Risk Interventions No flowsheet data found.

## 2020-03-08 NOTE — TOC Progression Note (Signed)
Transition of Care [facility]) - Progression Note    Patient Details  Name: Henry [name] MRN: [id number] Date of Birth: 07/26/1924  Transition of Care [facility]) CM/SW [place], [place] Phone Number: 03/08/2020, 4:36 PM  Clinical Narrative:     CSW called daughter [name]. Family chooses [place]. [place] to confirm; awaiting response  Expected Discharge Plan: [place] Barriers to Discharge: Continued Medical Work up  Expected Discharge Plan and Services Expected Discharge Plan: [place] arrangements for the past 2 months: Single Family Home                                       Social Determinants of Health (SDOH) Interventions    Readmission Risk Interventions No flowsheet data found.

## 2020-03-08 NOTE — Progress Notes (Signed)
[facility] ([facility]) Community Based Palliative Care       This patient is enrolled in our palliative care services in the community.  [facility] will continue to follow for any discharge planning needs and to coordinate continuation of palliative care.   If you have questions or need assistance, please call [phone] or contact the hospital Liaison listed on AMION.     Thank you for the opportunity to participate in this patient's care.     Chrislyn King, BSN, RN [facility] Hospital Liaison   [phone]   

## 2020-03-08 NOTE — Progress Notes (Signed)
PROGRESS NOTE    Henry [name]  JJK:[id number] DOB: 10-21-1924 DOA: 03/04/2020 PCP: [name], MD   Brief Narrative:  HPI on 03/05/2019 by Dr. Fuller Plan Henry [name] is a 87 y.o. male with medical history significant of combined systolic and diastolic CHF last EF 35- 37%, moderate to severe AR, CAD s/p CABG, PAD, HTN, HLD, DM type II, CKD stage IV presents with complaints of being unable to unable to breathe over the last week, but acutely worse over the last 2 days.  Reports that with any kind of activity he has been getting more short of breath and has been very  fatigue.  Complains of having chest pressure feeling that makes it difficult for him to take a deep breathe in.  He has been taking Lasix as prescribed and still is able to make urine.  Normally he is not on oxygen at baseline.  Patient has a DNR on arrival, but states that he would like to be a full code.  patient reports that his wife is in the memory care unit and declining and he would like to be the one to bury her if possible.   Interim history Admitted for CHF exacerbation. Cardiology consulted, was placed on IV Lasix and I will transition to oral Lasix 40 mg daily.  PT worked with patient though he walked 120 feet with minimal assistance, given that he lives alone, recommended supervision.  Currently pending SNF placement.  Assessment & Plan   Acute respiratory failure secondary to acute systolic congestive heart failure -Patient presented with shortness of breath and was found to be hypoxic down to 86% on room air -Noted to have severe faint crackles on physical exam on admission and required 6 L of nasal cannula.  Currently on high flow nasal cannula. -Chest x-ray showed interstitial opacities -BNP 808 -Patient placed on IV Lasix 80 mg twice daily- then decreased to 40mg  IV BID, and now transitioned to oral lasix 40mg  daily  -Echocardiogram EF 30 to 16%, grade 1 diastolic dysfunction.  Moderate aortic  stenosis -Cardiology consulted and appreciate -Monitor intake and output, daily weights -Patient does admit to dietary indiscretion and states he has been eating more salt and drinking more water lately. Have discussed in detail diet modifications with the patient   -Ordered home desaturation/O2 screen for possible needs  Elevated troponin/NSTEMI -Patient reports having chest pressure on admission -High-sensitivity troponin 344, 760 (although patient appears to chronically have an elevated troponin) -History of CABG -Not on a beta-blocker due to history of bradycardia -Was placed on IV heparin, transition to subcu -Continue aspirin and statin  Dizziness -Likely multifactorial including chronic anemia versus diuresis -Will continue to monitor closely  CKD, stage IV -Patient follows with nephrology, Dr. Marval Regal as an outpatient -Slight bump in creatinine today, to 3.81  -Continue to monitor BMP closely given diuresis  Essential hypertension -Continue amlodipine, Lasix  Anemia of chronic disease -hemoglobin 8.4, appears to be close to baseline (7-9) -Patient has been receiving darbepoetin alpha every 30 days as well as on iron supplementation -Hemoglobin currently stable -monitor CBC  Hypothyroidism -TSH 6.507- would recommend that thyroid function be retested in 4-6 weeks after acute illness/CHF exac has resolved -Continue Synthroid  Hyperlipidemia -Continue statin  Diabetes mellitus, type -Last hemoglobin A1c was 5.7 on 12/03/2019 -Currently not on any diabetic medications, likely diet controlled -Continue to monitor  Goals of care -Patient is a DNR -Appears that patient is being followed by for care in the community  Weakness/deconditioning -Suspect secondary to the above -PT and OT evaluated patient and feel patient may be home health appropriate but may need supervision, will continue to work with patient  Hypokalemia -Resolved with supplementation, continue  to monitor BMP  DVT Prophylaxis  Heparin  Code Status: DNR  Family Communication: None at bedside.  Daughter via phone on 03/06/2020  Disposition Plan:  Status is: Inpatient  Remains inpatient appropriate because:IV treatments appropriate due to intensity of illness or inability to take PO   Dispo: The patient is from: Home              Anticipated d/c is to: SNF              Anticipated d/c date is: 2 days              Patient currently is not medically stable to d/c.   Consultants Cardiology  Procedures  Echocardiogram  Antibiotics   Anti-infectives (From admission, onward)   None      Subjective:   Henry [name] seen and examined today.  Continues to complain of feeling weak and does not feel that he is ready to be discharged.  Is open to going to rehab.  Denies current dizziness.  Denies chest pain, abdominal pain, nausea or vomiting, diarrhea or constipation, headache. Feels breathing has improved but is requiring oxygen.     Objective:   Vitals:   03/08/20 0502 03/08/20 0800 03/08/20 0926 03/08/20 1140  BP: 110/76 (!) 136/56    Pulse: 78 79 77   Resp: 20     Temp: 97.6 F (36.4 C)   (!) 97 F (36.1 C)  TempSrc: Oral   Oral  SpO2: 99% 97% 94%   Weight:      Height:        Intake/Output Summary (Last 24 hours) at 03/08/2020 1311 Last data filed at 03/08/2020 1243 Gross per 24 hour  Intake 600 ml  Output 2500 ml  Net -1900 ml   Filed Weights   03/06/20 0344 03/07/20 0043 03/08/20 0124  Weight: 65.4 kg 61.5 kg 60.8 kg   Exam  General: Well developed, elderly, chronically ill-appearing, NAD  HEENT: NCAT, mucous membranes moist.   Cardiovascular: S1 S2 auscultated, 2/6 SEM, RRR  Respiratory: Diminished breath sounds however clear  Abdomen: Soft, nontender, nondistended, + bowel sounds  Extremities: warm dry without cyanosis clubbing or edema  Neuro: AAOx3, nonfocal  Psych: appropriate mood and affect, pleasant  Data Reviewed: I have  personally reviewed following labs and imaging studies  CBC: Recent Labs  Lab 03/04/20 0937 03/05/20 0425 03/06/20 0251 03/07/20 0324 03/08/20 0339  WBC 10.2 9.1 7.8 8.5 7.7  NEUTROABS 7.7  --   --   --   --   HGB 10.6* 9.1* 8.7* 8.5* 8.4*  HCT 33.5* 27.0* 26.6* 25.4* 25.9*  MCV 90.3 86.8 87.8 87.3 88.1  PLT 259 265 240 256 458   Basic Metabolic Panel: Recent Labs  Lab 03/04/20 0937 03/05/20 0425 03/06/20 0251 03/07/20 0324 03/08/20 0339  NA 134* 136 136 136 134*  K 3.8 3.5 3.3* 3.7 4.3  CL 103 103 102 101 97*  CO2 19* 21* 22 22 25   GLUCOSE 116* 127* 107* 105* 102*  BUN 47* 50* 56* 64* 71*  CREATININE 3.29* 3.37* 3.63* 3.88* 3.81*  CALCIUM 9.0 8.8* 8.9 8.8* 9.1   GFR: Estimated Creatinine Clearance: 10.4 mL/min (A) (by C-G formula based on SCr of 3.81 mg/dL (H)). Liver Function Tests: Recent Labs  Lab 03/04/20 0937  AST 28  ALT 15  ALKPHOS 71  BILITOT 0.8  PROT 7.1  ALBUMIN 3.5   No results for input(s): LIPASE, AMYLASE in the last 168 hours. No results for input(s): AMMONIA in the last 168 hours. Coagulation Profile: No results for input(s): INR, PROTIME in the last 168 hours. Cardiac Enzymes: No results for input(s): CKTOTAL, CKMB, CKMBINDEX, TROPONINI in the last 168 hours. BNP (last 3 results) No results for input(s): PROBNP in the last 8760 hours. HbA1C: No results for input(s): HGBA1C in the last 72 hours. CBG: Recent Labs  Lab 03/07/20 0613 03/07/20 1143 03/07/20 1649 03/07/20 2107 03/08/20 1141  GLUCAP 103* 127* 152* 107* 168*   Lipid Profile: No results for input(s): CHOL, HDL, LDLCALC, TRIG, CHOLHDL, LDLDIRECT in the last 72 hours. Thyroid Function Tests: No results for input(s): TSH, T4TOTAL, FREET4, T3FREE, THYROIDAB in the last 72 hours. Anemia Panel: No results for input(s): VITAMINB12, FOLATE, FERRITIN, TIBC, IRON, RETICCTPCT in the last 72 hours. Urine analysis:    Component Value Date/Time   COLORURINE YELLOW 02/12/2019 1215    APPEARANCEUR CLEAR 02/12/2019 1215   LABSPEC 1.015 02/12/2019 1215   PHURINE 6.5 02/12/2019 1215   GLUCOSEU NEGATIVE 02/12/2019 1215   HGBUR NEGATIVE 02/12/2019 1215   [place] 02/12/2019 1215   KETONESUR NEGATIVE 02/12/2019 1215   PROTEINUR 100 (A) 04/10/2012 1929   UROBILINOGEN 0.2 02/12/2019 1215   NITRITE NEGATIVE 02/12/2019 1215   LEUKOCYTESUR NEGATIVE 02/12/2019 1215   Sepsis Labs: @LABRCNTIP (procalcitonin:4,lacticidven:4)  ) Recent Results (from the past 240 hour(s))  Resp Panel by RT-PCR (Flu A&B, Covid) Nasopharyngeal Swab     Status: None   Collection Time: 03/04/20  9:37 AM   Specimen: Nasopharyngeal Swab; Nasopharyngeal(NP) swabs in vial transport medium  Result Value Ref Range Status   SARS Coronavirus 2 by RT PCR NEGATIVE NEGATIVE Final    Comment: (NOTE) SARS-CoV-2 target nucleic acids are NOT DETECTED.  The SARS-CoV-2 RNA is generally detectable in upper respiratory specimens during the acute phase of infection. The lowest concentration of SARS-CoV-2 viral copies this assay can detect is 138 copies/mL. A negative result does not preclude SARS-Cov-2 infection and should not be used as the sole basis for treatment or other patient management decisions. A negative result may occur with  improper specimen collection/handling, submission of specimen other than nasopharyngeal swab, presence of viral mutation(s) within the areas targeted by this assay, and inadequate number of viral copies(<138 copies/mL). A negative result must be combined with clinical observations, patient history, and epidemiological information. The expected result is Negative.  Fact Sheet for Patients:  EntrepreneurPulse.com.au  Fact Sheet for Healthcare Providers:  IncredibleEmployment.be  This test is no t yet approved or cleared by the [place] FDA and  has been authorized for detection and/or diagnosis of SARS-CoV-2 by FDA under an  Emergency Use Authorization (EUA). This EUA will remain  in effect (meaning this test can be used) for the duration of the COVID-19 declaration under Section 564(b)(1) of the Act, 21 U.S.C.section 360bbb-3(b)(1), unless the authorization is terminated  or revoked sooner.       Influenza A by PCR NEGATIVE NEGATIVE Final   Influenza B by PCR NEGATIVE NEGATIVE Final    Comment: (NOTE) The Xpert Xpress SARS-CoV-2/FLU/RSV plus assay is intended as an aid in the diagnosis of influenza from Nasopharyngeal swab specimens and should not be used as a sole basis for treatment. Nasal washings and aspirates are unacceptable for Xpert Xpress SARS-CoV-2/FLU/RSV testing.  Fact  Sheet for Patients: EntrepreneurPulse.com.au  Fact Sheet for Healthcare Providers: IncredibleEmployment.be  This test is not yet approved or cleared by the [place] FDA and has been authorized for detection and/or diagnosis of SARS-CoV-2 by FDA under an Emergency Use Authorization (EUA). This EUA will remain in effect (meaning this test can be used) for the duration of the COVID-19 declaration under Section 564(b)(1) of the Act, 21 U.S.C. section 360bbb-3(b)(1), unless the authorization is terminated or revoked.  Performed at [facility], [place] [address], [place], [place] [address]       Radiology Studies: No results found.   Scheduled Meds: . aspirin EC  81 mg Oral Daily  . atorvastatin  40 mg Oral QPM  . calcitRIOL  0.25 mcg Oral Weekly  . calcium acetate  667 mg Oral BID WC  . furosemide  40 mg Oral Daily  . heparin injection (subcutaneous)  5,000 Units Subcutaneous Q8H  . [START ON 03/09/2020] hydrALAZINE  10 mg Oral Q8H  . isosorbide mononitrate  30 mg Oral Daily  . levothyroxine  50 mcg Oral QAC breakfast  . sodium chloride flush  3 mL Intravenous Q12H   Continuous Infusions: . sodium chloride       LOS: 4 days   Time Spent in minutes   30  minutes  Henry [name] D.O. on 03/08/2020 at 1:11 PM  Between 7am to 7pm - Please see pager noted on [email or web address]  After 7pm go to [email or web address]  And look for the night coverage person covering for me after hours  [facility] Office  [phone]

## 2020-03-09 DIAGNOSIS — I2510 Atherosclerotic heart disease of native coronary artery without angina pectoris: Secondary | ICD-10-CM | POA: Diagnosis not present

## 2020-03-09 DIAGNOSIS — I5023 Acute on chronic systolic (congestive) heart failure: Secondary | ICD-10-CM | POA: Diagnosis not present

## 2020-03-09 DIAGNOSIS — I509 Heart failure, unspecified: Secondary | ICD-10-CM | POA: Diagnosis not present

## 2020-03-09 LAB — CBC
HCT: 26.1 % — ABNORMAL LOW (ref 39.0–52.0)
Hemoglobin: 8.5 g/dL — ABNORMAL LOW (ref 13.0–17.0)
MCH: 28.9 pg (ref 26.0–34.0)
MCHC: 32.6 g/dL (ref 30.0–36.0)
MCV: 88.8 fL (ref 80.0–100.0)
Platelets: 284 10*3/uL (ref 150–400)
RBC: 2.94 MIL/uL — ABNORMAL LOW (ref 4.22–5.81)
RDW: 14.7 % (ref 11.5–15.5)
WBC: 6.8 10*3/uL (ref 4.0–10.5)
nRBC: 0 % (ref 0.0–0.2)

## 2020-03-09 LAB — BASIC METABOLIC PANEL
Anion gap: 14 (ref 5–15)
BUN: 75 mg/dL — ABNORMAL HIGH (ref 8–23)
CO2: 25 mmol/L (ref 22–32)
Calcium: 9.3 mg/dL (ref 8.9–10.3)
Chloride: 96 mmol/L — ABNORMAL LOW (ref 98–111)
Creatinine, Ser: 3.76 mg/dL — ABNORMAL HIGH (ref 0.61–1.24)
GFR, Estimated: 14 mL/min — ABNORMAL LOW (ref >60)
Glucose, Bld: 121 mg/dL — ABNORMAL HIGH (ref 70–99)
Potassium: 4.2 mmol/L (ref 3.5–5.1)
Sodium: 135 mmol/L (ref 135–145)

## 2020-03-09 LAB — GLUCOSE, CAPILLARY
Glucose-Capillary: 113 mg/dL — ABNORMAL HIGH (ref 70–99)
Glucose-Capillary: 125 mg/dL — ABNORMAL HIGH (ref 70–99)
Glucose-Capillary: 139 mg/dL — ABNORMAL HIGH (ref 70–99)

## 2020-03-09 LAB — SARS CORONAVIRUS 2 (TAT 6-24 HRS): SARS Coronavirus 2: NEGATIVE

## 2020-03-09 MED ORDER — HYDRALAZINE HCL 10 MG PO TABS: 10.0000 mg | ORAL_TABLET | Freq: Three times a day (TID) | ORAL | 0 refills | Status: DC

## 2020-03-09 MED ORDER — ISOSORBIDE MONONITRATE ER 30 MG PO TB24: 30.0000 mg | ORAL_TABLET | Freq: Every day | ORAL | 0 refills | Status: DC

## 2020-03-09 NOTE — Progress Notes (Addendum)
Progress Note  Patient Name: Henry [name] Date of Encounter: 03/09/2020  Primary Cardiologist: Dr. Burt Knack, MD   Subjective   Doing well, no specific complaints. Has plans for rehab after discharge   Inpatient Medications    Scheduled Meds: . aspirin EC  81 mg Oral Daily  . atorvastatin  40 mg Oral QPM  . calcitRIOL  0.25 mcg Oral Weekly  . calcium acetate  667 mg Oral BID WC  . furosemide  40 mg Oral Daily  . heparin injection (subcutaneous)  5,000 Units Subcutaneous Q8H  . hydrALAZINE  10 mg Oral Q8H  . isosorbide mononitrate  30 mg Oral Daily  . levothyroxine  50 mcg Oral QAC breakfast  . sodium chloride flush  3 mL Intravenous Q12H   Continuous Infusions: . sodium chloride     PRN Meds: sodium chloride, acetaminophen, ondansetron (ZOFRAN) IV, polyvinyl alcohol, sodium chloride flush   Vital Signs    Vitals:   03/09/20 0337 03/09/20 0338 03/09/20 0339 03/09/20 0345  BP:    140/62  Pulse: 81 79 80 79  Resp:    18  Temp:    98.6 F (37 C)  TempSrc:    Oral  SpO2: 93% 92% 94% 95%  Weight:    59.5 kg  Height:        Intake/Output Summary (Last 24 hours) at 03/09/2020 0732 Last data filed at 03/09/2020 0349 Gross per 24 hour  Intake 1200 ml  Output 2250 ml  Net -1050 ml   Filed Weights   03/07/20 0043 03/08/20 0124 03/09/20 0345  Weight: 61.5 kg 60.8 kg 59.5 kg    Physical Exam   General: Elderly, NAD Lungs: Diminished in bilateral bases with bilateral crackles. Breathing is unlabored. Cardiovascular: RRR with S1 S2. + murmur Abdomen: Soft, non-tender, non-distended. No obvious abdominal masses. Extremities: No edema. Radial pulses 2+ bilaterally Neuro: Alert and oriented. No focal deficits. No facial asymmetry. MAE spontaneously. Psych: Responds to questions appropriately with normal affect.    Labs    Chemistry Recent Labs  Lab 03/04/20 [phone] 03/05/20 0425 03/07/20 0324 03/08/20 0339 03/09/20 0338  NA 134*   < > 136 134* 135  K 3.8    < > 3.7 4.3 4.2  CL 103   < > 101 97* 96*  CO2 19*   < > _0 GLUCOSE 116*   < > 105* 102* 121*  BUN 47*   < > 64* 71* 75*  CREATININE 3.29*   < > 3.88* 3.81* 3.76*  CALCIUM 9.0   < > 8.8* 9.1 9.3  PROT 7.1  --   --   --   --   ALBUMIN 3.5  --   --   --   --   AST 28  --   --   --   --   ALT 15  --   --   --   --   ALKPHOS 71  --   --   --   --   BILITOT 0.8  --   --   --   --   GFRNONAA 17*   < > 14* 14* 14*  ANIONGAP 12   < > _1 < > = values in this interval not displayed.     Hematology Recent Labs  Lab 03/07/20 0324 03/08/20 0339 03/09/20 0338  WBC 8.5 7.7 6.8  RBC 2.91* 2.94* 2.94*  HGB 8.5* 8.4* 8.5*  HCT  25.4* 25.9* 26.1*  MCV 87.3 88.1 88.8  MCH 29.2 28.6 28.9  MCHC 33.5 32.4 32.6  RDW 15.3 14.9 14.7  PLT 256 253 284    Cardiac EnzymesNo results for input(s): TROPONINI in the last 168 hours. No results for input(s): TROPIPOC in the last 168 hours.   BNP Recent Labs  Lab 03/04/20 0937  BNP 808.0*     DDimer No results for input(s): DDIMER in the last 168 hours.   Radiology    No results found.  Telemetry    03/09/20 NSR- Personally Reviewed  ECG    No new tracing as of 03/09/20- Personally Reviewed  Cardiac Studies   Echo 10/2019 1. Left ventricular ejection fraction, by estimation, is 35 to 40%. The  left ventricle has moderately decreased function. The left ventricle  demonstrates global hypokinesis. Left ventricular diastolic function could  not be evaluated.  2. Right ventricular systolic function is mildly reduced. The right  ventricular size is normal. There is normal pulmonary artery systolic  pressure.  3. Left atrial size was mildly dilated.  4. The mitral valve is normal in structure. Mild mitral valve  regurgitation. No evidence of mitral stenosis.  5. The aortic valve is tricuspid. There is severe calcifcation of the  aortic valve. There is moderate thickening of the aortic valve. Aortic  valve regurgitation  is moderate. Moderate aortic valve stenosis. Aortic  valve area, by VTI measures 0.92 cm.  Aortic valve mean gradient measures 18.0 mmHg. Aortic valve Vmax measures  2.87 m/s. The dimensionless index is low at 0.26 and consistent with more  severe AS. SV index is reduced at 31. The AV mean gradient is likely  underestimated in the setting of LV  dysfunction and this likely represents at least moderate to severe low  flow low gradient aortic stenosis.  6. Aortic dilatation noted. There is borderline dilatation of the aortic  root, measuring 37 mm. There is dilatation of the ascending aorta,  measuring 38 mm.  7. The inferior vena cava is normal in size with greater than 50%  respiratory variability, suggesting right atrial pressure of 3 mmHg.   Echocardiogram 03/05/20:  1. Left ventricular ejection fraction, by estimation, is 30 to 35%. The  left ventricle has moderately decreased function. The left ventricle  demonstrates global hypokinesis. Left ventricular diastolic parameters are  consistent with Grade I diastolic  dysfunction (impaired relaxation).  2. Right ventricular systolic function is normal. The right ventricular  size is normal. There is mildly elevated pulmonary artery systolic  pressure.  3. Left atrial size was moderately dilated.  4. The mitral valve is normal in structure. Mild mitral valve  regurgitation. No evidence of mitral stenosis.  5. Mean gradient is consistent with mild aortic stenosis, unchanged form  10/2019. However, dimensionless index is 0.24. This is decreased from 0.26  on 10/2019. Consistent with at least moderate aortic stenosis. Cannot  exclude low-flow, low-gradient aortic  stenosis. The aortic valve is calcified. There is moderate calcification  of the aortic valve. There is moderate thickening of the aortic valve.  Aortic valve regurgitation is mild. Moderate to severe aortic valve  stenosis. Aortic valve area, by VTI  measures 0.76 cm.  Aortic valve mean gradient measures 17.0 mmHg. Aortic  valve Vmax measures 2.68 m/s.  6. The inferior vena cava is normal in size with <50% respiratory  variability, suggesting right atrial pressure of 8 mmHg.   Cath 02/2018 LEFT HEART CATH AND CORS/GRAFTS ANGIOGRAPHY    Conclusion  Ost RCA to Prox RCA lesion is 100% stenosed.  Origin to Prox Graft lesion is 100% stenosed.  Origin lesion is 100% stenosed.  Prox LAD lesion is 100% stenosed.  Ost 1st Mrg lesion is 90% stenosed.  1. Severe native 3 vessel disease with total occlusion of the RCA and LAD, severe stenosis of the left circumflex 2. S/P remote CABG with continued patency of the LIMA-LAD and SVG-diagonal, total occlusion of the SVG-RCA and SVG-PDA  Recommend medical management of CAD. No clear culprit lesion. Both SVG occlusions appear chronic. No targets for PCI.  Patient Profile     88 y.o. male with a hx of CAD status post CABG with graft failure, moderate to severe aortic stenosis, not a candidate for TAVR, chronic combined CHF, peripheral arterial disease, chronic kidney disease stage IV with failed dialysis access, hypertension, diabetes mellitus, hyperlipidemia second-degree type I AV block, and orthostatic intolerancewho is being seen for the evaluation of CHF.  Echocardiogram this admission shows ejection fraction 30 to 22%, grade 1 diastolic dysfunction, moderate left atrial enlargement, mild mitral regurgitation, moderate to severe aortic stenosis, mild aortic insufficiency  Assessment & Plan    1. NSTEMI with hx of CAD s/p CABG: -Pt presented with SOB with reports of chest pressure found to have elevated HsT at 344>>760 -Last cath 02/2018 withcontinued patency of the LIMA-LAD and SVG-diagonal, total occlusion of the SVG-RCA and SVG-PDA with no targets for PCI at that time  -Denies recurrent chest pain symptoms  -Continue ASA and statin  -Not on BB due to prior hx of bradycardia  2. Acute on  chronic combined CHF with SOB: -Presented with SOB initially with activity which progressed to SOB with rest. He was found to be quite hypoxic on arrival. CXR with vascular congestion and and BNP at 808. -Symptoms initially felt to be multifactorial from valvular disease, CHF and non-STEMI -Previously on PTA MWF Lasix 65m PO QD however was diuresed with IV Lasix 818mBID. -Cr slightly improved today>>>will transition to PO dosing today given potential d/c to rehab  -Weight, 131lb today with admission weight at 139lb -I&O, net negative 6.5L since admission  -Echo this admission with EF at 35-40% with global hypokinesis with G1DD, mild MR, and mod AS with a mean gradient at 1727m>>simliar to prior echo from 10/2019  3. CKD stage IV: -Creatinine 3.76 today>>slightly improved from 3.81 yesterday -Follows with OP nephrology  -Transition to PO dosing today at 70m13m  4. Hypothyroidism:  -On PTA synthroid with elevated TSH   5. HTN  -Stable, 137/59>>127/63 -Continue imdur/hydralazine   Signed, JillKathyrn DrownC [facility] Pager: [phone]/2022, 7:32 AM     For questions or updates, please contact   Please consult [email or web address] for contact info under Cardiology/STEMI. As above, patient seen and examined.  His dyspnea is much improved.  He denies chest pain.  We will change Lasix to 40 mg by mouth daily.  He will need be met 1 week following discharge.  Hopefully we can maintain euvolemia without worsening renal function.  Plan is for transfer to rehab to regain strength.  Continue all present medications at discharge (note amlodipine has been discontinued and patient started on hydralazine/nitrates; this can be titrated as an outpatient).  Can consider adding beta-blockade as an outpatient if heart rate allows.  Patient should follow-up with APP in 1 week.  Follow-up Dr. CoopBurt Knackonths.  Cardiology will sign off.  Please call with questions. BriaKirk Ruths

## 2020-03-09 NOTE — Discharge Summary (Signed)
Physician Discharge Summary  Henry [name] W[facility]:[id number] DOB: 07-07-1924 DOA: 03/04/2020  PCP: [name], MD  Admit date: 03/04/2020 Discharge date: 03/09/2020  Admitted From: Home Disposition:  SNF  Recommendations for Outpatient Follow-up:  1. Follow up with PCP in 1-2 weeks 2. Follow up with Dr. Burt Knack in 3 months. Per Cardiology, consider adding beta blocker as an outpatient if HR will allow 3. Recommend repeat TSH in 4-6 weeks to ensure euthyroid state  Discharge Condition:Improved CODE STATUS:DNR Diet recommendation: Heart healthy   Brief/Interim Summary: 87 y.o.malewith medical history significant ofcombined systolic and diastolic CHF last EF 35-36%,[id number] to severe AR, CAD s/pCABG, PAD, HTN, HLD, DM type II, CKD stage IV presents with complaints of being unable to unable to breathe over the last week, but acutely worse over the last 2 days. Pt was treated for acute CHF exacerbation with IV lasix, ultimately transitioned to PO lasix. Cardiology had been following  Discharge Diagnoses:  Principal Problem:   Systolic congestive heart failure (HCC) Active Problems:   HYPERCHOLESTEROLEMIA   CAD (coronary artery disease)   Hypothyroidism   Hypertension   Respiratory failure with hypoxia (HCC)  Acute respiratory failure secondary to acute systolic congestive heart failure -Patient presented with shortness of breath and was found to be hypoxic down to 86% on room air -Noted to have severe faint crackles on physical exam on admission and required 6 L of nasal cannula.  Currently on high flow nasal cannula. -Chest x-ray showed interstitial opacities -BNP noted to be elevated -Patient placed on IV Lasix 80 mg twice daily- then decreased to 40mg  IV BID, later transitioned to oral lasix 40mg  daily  -Echocardiogram EF 30 to 08%, grade 1 diastolic dysfunction.  Moderate aortic stenosis -Cardiology consulted and appreciate. Cardiology has since signed off  Elevated  troponin/NSTEMI -Patient reports having chest pressure on admission -High-sensitivity troponin 344, 760 (although patient appears to chronically have an elevated troponin) -History of CABG -Not on a beta-blocker due to history of bradycardia -Was placed on IV heparin, transition to subcu prophylactic dose later -Continue aspirin and statin  Dizziness -Likely multifactorial including chronic anemia versus diuresis  CKD, stage IV -Patient follows with nephrology, Dr. Marval Regal as an outpatient -Slight bump in creatinine which has since remained stable  Essential hypertension -Continue amlodipine, Lasix  Anemia of chronic disease -hemoglobin 8.4, appears to be close to baseline (7-9) -Patient has been receiving darbepoetin alpha every 30 days as well as on iron supplementation -Hgb has remained stable  Hypothyroidism -TSH 6.507- would recommend that thyroid function be retested in 4-6 weeks after acute illness/CHF exac has resolved -Continue Synthroid as tolerated  Hyperlipidemia -Continue statin  Diabetes mellitus, type -Last hemoglobin A1c was 5.7 on 12/03/2019 -Currently not on any diabetic medications, likely diet controlled -Continue to monitor  Goals of care -Patient is a DNR -Appears that patient is being followed by for care in the community  Weakness/deconditioning -Suspect secondary to the above -PT and OT evaluated patient and feel patient may be home health appropriate but may need supervision, will continue to work with patient  Hypokalemia -Resolved with supplementation, continue to monitor BMP  Discharge Instructions   Allergies as of 03/09/2020   No Known Allergies     Medication List    STOP taking these medications   amLODipine 10 MG tablet Commonly known as: NORVASC     TAKE these medications   accu-chek multiclix lancets USE TWICE DAILY.   aspirin EC 81 MG tablet Take 81 mg by  mouth daily.   atorvastatin 40 MG  tablet Commonly known as: LIPITOR Take 1 tablet (40 mg total) by mouth every evening.   b complex-vitamin c-folic acid 0.8 MG Tabs tablet Take 1 tablet by mouth at bedtime.   calcitRIOL 0.25 MCG capsule Commonly known as: ROCALTROL Take 0.25 mcg by mouth once a week. Monday   calcium acetate 667 MG capsule Commonly known as: PHOSLO Take 667 mg by mouth 2 (two) times daily with a meal.   Darbepoetin Alfa 100 MCG/0.5ML Sosy injection Commonly known as: ARANESP Inject 100 mcg into the skin every 30 (thirty) days.   diphenoxylate-atropine 2.5-0.025 MG tablet Commonly known as: Lomotil Take 1 tablet by mouth 4 (four) times daily as needed for diarrhea or loose stools.   ferrous gluconate 324 MG tablet Commonly known as: FERGON Take 324 mg by mouth daily.   furosemide 40 MG tablet Commonly known as: LASIX Take 1 tablet (40 mg total) by mouth every other day. Also take additional dose for weight greater than 137 lb   hydrALAZINE 10 MG tablet Commonly known as: APRESOLINE Take 1 tablet (10 mg total) by mouth every 8 (eight) hours.   isosorbide mononitrate 30 MG 24 hr tablet Commonly known as: IMDUR Take 1 tablet (30 mg total) by mouth daily. Start taking on: March 10, 2020   levothyroxine 50 MCG tablet Commonly known as: SYNTHROID Take 50 mcg by mouth daily before breakfast.   nitroGLYCERIN 0.4 MG SL tablet Commonly known as: NITROSTAT Place 1 tablet (0.4 mg total) under the tongue every 5 (five) minutes as needed for chest pain.   PRESERVISION AREDS 2 PO Take 1 tablet by mouth daily.   Systane Balance 0.6 % Soln Generic drug: Propylene Glycol Place 1 drop into both eyes daily as needed (dry eyes).       Follow-up Information    [name], MD. Schedule an appointment as soon as possible for a visit in 2 week(s).   Specialties: Internal Medicine, Radiology Contact information: [place] [address] [phone]        Sherren Mocha,  MD .   Specialty: Cardiology Contact information: [phone] [address] [address] [phone]              No Known Allergies  Consultations:  Cardiology  Procedures/Studies: DG Chest Portable 1 View  Result Date: 03/04/2020 CLINICAL DATA:  Shortness of breath Decreased O2 saturation Increased fatigue History of CHF EXAM: PORTABLE CHEST 1 VIEW COMPARISON:  11/11/2019 FINDINGS: Unchanged mild cardiomegaly. Postop changes of CABG again seen. There has been interval worsening of pulmonary vascular congestion. Bilateral airspace opacities have increased since prior exam, more focal in the right lower lobe lung. IMPRESSION: Interval worsening of bilateral lung opacities and pulmonary vascular congestion most consistent with worsening CHF. Given that there is asymmetric increased density in the right lower lung, findings may also be related to concurrent pneumonia. Electronically Signed   By: [name] M.D.   On: 03/04/2020 09:35   ECHOCARDIOGRAM COMPLETE  Result Date: 03/05/2020    ECHOCARDIOGRAM REPORT   Patient Name:   Henry [name] [facility] Date of Exam: 03/05/2020 Medical Rec #:  [id number]        Height:       69.0 in Accession #:    [id number]       Weight:       146.2 lb Date of Birth:  27-Aug-1924  BSA:          1.808 m Patient Age:    14 years         BP:           137/62 mmHg Patient Gender: M                HR:           86 bpm. Exam Location:  Inpatient Procedure: 2D Echo, Cardiac Doppler and Color Doppler Indications:    CHF-Acute Systolic  History:        Patient has prior history of Echocardiogram examinations, most                 recent 11/10/2019. Previous Myocardial Infarction and CAD, Aortic                 Valve Disease, Signs/Symptoms:Shortness of Breath; Risk                 Factors:Hypertension, Dyslipidemia and Diabetes. CKD.  Sonographer:    [name] Lefort RDCS (AE) Referring Phys: [id number] RONDELL A SMITH IMPRESSIONS  1. Left ventricular ejection  fraction, by estimation, is 30 to 35%. The left ventricle has moderately decreased function. The left ventricle demonstrates global hypokinesis. Left ventricular diastolic parameters are consistent with Grade I diastolic dysfunction (impaired relaxation).  2. Right ventricular systolic function is normal. The right ventricular size is normal. There is mildly elevated pulmonary artery systolic pressure.  3. Left atrial size was moderately dilated.  4. The mitral valve is normal in structure. Mild mitral valve regurgitation. No evidence of mitral stenosis.  5. Mean gradient is consistent with mild aortic stenosis, unchanged form 10/2019. However, dimensionless index is 0.24. This is decreased from 0.26 on 10/2019. Consistent with at least moderate aortic stenosis. Cannot exclude low-flow, low-gradient aortic  stenosis. The aortic valve is calcified. There is moderate calcification of the aortic valve. There is moderate thickening of the aortic valve. Aortic valve regurgitation is mild. Moderate to severe aortic valve stenosis. Aortic valve area, by VTI measures 0.76 cm. Aortic valve mean gradient measures 17.0 mmHg. Aortic valve Vmax measures 2.68 m/s.  6. The inferior vena cava is normal in size with <50% respiratory variability, suggesting right atrial pressure of 8 mmHg. FINDINGS  Left Ventricle: Left ventricular ejection fraction, by estimation, is 30 to 35%. The left ventricle has moderately decreased function. The left ventricle demonstrates global hypokinesis. The left ventricular internal cavity size was normal in size. There is no left ventricular hypertrophy. Left ventricular diastolic parameters are consistent with Grade I diastolic dysfunction (impaired relaxation). Right Ventricle: The right ventricular size is normal. No increase in right ventricular wall thickness. Right ventricular systolic function is normal. There is mildly elevated pulmonary artery systolic pressure. The tricuspid regurgitant  velocity is 2.82  m/s, and with an assumed right atrial pressure of 8 mmHg, the estimated right ventricular systolic pressure is 64.6 mmHg. Left Atrium: Left atrial size was moderately dilated. Right Atrium: Right atrial size was normal in size. Pericardium: There is no evidence of pericardial effusion. Mitral Valve: The mitral valve is normal in structure. Mild mitral valve regurgitation. No evidence of mitral valve stenosis. MV peak gradient, 10.6 mmHg. The mean mitral valve gradient is 4.0 mmHg. Tricuspid Valve: The tricuspid valve is normal in structure. Tricuspid valve regurgitation is mild . No evidence of tricuspid stenosis. Aortic Valve: Mean gradient is consistent with mild aortic stenosis, unchanged form 10/2019. However, dimensionless index is 0.24. This is decreased  from 0.26 on 10/2019. Consistent with at least moderate aortic stenosis. Cannot exclude low-flow, low-gradient aortic stenosis. The aortic valve is calcified. There is moderate calcification of the aortic valve. There is moderate thickening of the aortic valve. Aortic valve regurgitation is mild. Aortic regurgitation PHT measures 240 msec. Moderate to severe aortic stenosis is present. Aortic valve mean gradient measures 17.0 mmHg. Aortic valve peak gradient measures 28.6 mmHg. Aortic valve area, by VTI measures 0.76 cm. Pulmonic Valve: The pulmonic valve was normal in structure. Pulmonic valve regurgitation is not visualized. No evidence of pulmonic stenosis. Aorta: The aortic root is normal in size and structure. Venous: The inferior vena cava is normal in size with less than 50% respiratory variability, suggesting right atrial pressure of 8 mmHg. IAS/Shunts: No atrial level shunt detected by color flow Doppler.  LEFT VENTRICLE PLAX 2D LVIDd:         4.60 cm  Diastology LVIDs:         3.90 cm  LV e' medial:    2.03 cm/s LV PW:         1.20 cm  LV E/e' medial:  57.1 LV IVS:        1.30 cm  LV e' lateral:   6.45 cm/s LVOT diam:     2.00 cm  LV  E/e' lateral: 18.0 LV SV:         43 LV SV Index:   24 LVOT Area:     3.14 cm                          3D Volume EF:                         3D EF:        32 %                         LV EDV:       187 ml                         LV ESV:       127 ml                         LV SV:        60 ml RIGHT VENTRICLE            IVC RV Basal diam:  2.60 cm    IVC diam: 1.40 cm RV S prime:     9.75 cm/s TAPSE (M-mode): 1.9 cm LEFT ATRIUM             Index       RIGHT ATRIUM           Index LA diam:        4.00 cm 2.21 cm/m  RA Area:     14.20 cm LA Vol (A2C):   55.9 ml 30.91 ml/m RA Volume:   30.30 ml  16.76 ml/m LA Vol (A4C):   62.7 ml 34.67 ml/m LA Biplane Vol: 61.1 ml 33.79 ml/m  AORTIC VALVE AV Area (Vmax):    0.84 cm AV Area (Vmean):   0.75 cm AV Area (VTI):     0.76 cm AV Vmax:           267.50 cm/s AV Vmean:  196.500 cm/s AV VTI:            0.568 m AV Peak Grad:      28.6 mmHg AV Mean Grad:      17.0 mmHg LVOT Vmax:         71.80 cm/s LVOT Vmean:        46.700 cm/s LVOT VTI:          0.137 m LVOT/AV VTI ratio: 0.24 AI PHT:            240 msec  AORTA Ao Root diam: 3.80 cm Ao Asc diam:  3.80 cm MITRAL VALVE                TRICUSPID VALVE MV Area (PHT): 5.97 cm     TR Peak grad:   31.8 mmHg MV Peak grad:  10.6 mmHg    TR Vmax:        282.00 cm/s MV Mean grad:  4.0 mmHg MV Vmax:       1.63 m/s     SHUNTS MV Vmean:      87.6 cm/s    Systemic VTI:  0.14 m MV Decel Time: 127 msec     Systemic Diam: 2.00 cm MV E velocity: 116.00 cm/s MV A velocity: 143.00 cm/s MV E/A ratio:  0.81 Skeet Latch MD Electronically signed by Skeet Latch MD Signature Date/Time: 03/05/2020/3:16:34 PM    Final      Subjective: Eager to go to SNF  Discharge Exam: Vitals:   03/09/20 0800 03/09/20 0858  BP: (!) 141/89 (!) 131/59  Pulse: 88 83  Resp:  (!) 21  Temp:  98.1 F (36.7 C)  SpO2: 91% 93%   Vitals:   03/09/20 0339 03/09/20 0345 03/09/20 0800 03/09/20 0858  BP:  140/62 (!) 141/89 (!) 131/59  Pulse: 80  79 88 83  Resp:  18  (!) 21  Temp:  98.6 F (37 C)  98.1 F (36.7 C)  TempSrc:  Oral  Oral  SpO2: 94% 95% 91% 93%  Weight:  59.5 kg    Height:        General: Pt is alert, awake, not in acute distress Cardiovascular: RRR, S1/S2 +, no rubs, no gallops Respiratory: CTA bilaterally, no wheezing, no rhonchi Abdominal: Soft, NT, ND, bowel sounds + Extremities: no edema, no cyanosis   The results of significant diagnostics from this hospitalization (including imaging, microbiology, ancillary and laboratory) are listed below for reference.     Microbiology: Recent Results (from the past 240 hour(s))  Resp Panel by RT-PCR (Flu A&B, Covid) Nasopharyngeal Swab     Status: None   Collection Time: 03/04/20  9:37 AM   Specimen: Nasopharyngeal Swab; Nasopharyngeal(NP) swabs in vial transport medium  Result Value Ref Range Status   SARS Coronavirus 2 by RT PCR NEGATIVE NEGATIVE Final    Comment: (NOTE) SARS-CoV-2 target nucleic acids are NOT DETECTED.  The SARS-CoV-2 RNA is generally detectable in upper respiratory specimens during the acute phase of infection. The lowest concentration of SARS-CoV-2 viral copies this assay can detect is 138 copies/mL. A negative result does not preclude SARS-Cov-2 infection and should not be used as the sole basis for treatment or other patient management decisions. A negative result may occur with  improper specimen collection/handling, submission of specimen other than nasopharyngeal swab, presence of viral mutation(s) within the areas targeted by this assay, and inadequate number of viral copies(<138 copies/mL). A negative result must be combined with clinical observations, patient history, and  epidemiological information. The expected result is Negative.  Fact Sheet for Patients:  EntrepreneurPulse.com.au  Fact Sheet for Healthcare Providers:  IncredibleEmployment.be  This test is no t yet approved or cleared  by the [place] FDA and  has been authorized for detection and/or diagnosis of SARS-CoV-2 by FDA under an Emergency Use Authorization (EUA). This EUA will remain  in effect (meaning this test can be used) for the duration of the COVID-19 declaration under Section 564(b)(1) of the Act, 21 U.S.C.section 360bbb-3(b)(1), unless the authorization is terminated  or revoked sooner.       Influenza A by PCR NEGATIVE NEGATIVE Final   Influenza B by PCR NEGATIVE NEGATIVE Final    Comment: (NOTE) The Xpert Xpress SARS-CoV-2/FLU/RSV plus assay is intended as an aid in the diagnosis of influenza from Nasopharyngeal swab specimens and should not be used as a sole basis for treatment. Nasal washings and aspirates are unacceptable for Xpert Xpress SARS-CoV-2/FLU/RSV testing.  Fact Sheet for Patients: EntrepreneurPulse.com.au  Fact Sheet for Healthcare Providers: IncredibleEmployment.be  This test is not yet approved or cleared by the [place] FDA and has been authorized for detection and/or diagnosis of SARS-CoV-2 by FDA under an Emergency Use Authorization (EUA). This EUA will remain in effect (meaning this test can be used) for the duration of the COVID-19 declaration under Section 564(b)(1) of the Act, 21 U.S.C. section 360bbb-3(b)(1), unless the authorization is terminated or revoked.  Performed at [facility], [place] [address] [place], [place] [address]      Labs: BNP (last 3 results) Recent Labs    11/09/19 1006 03/04/20 0937  BNP 979.3* 623.7*   Basic Metabolic Panel: Recent Labs  Lab 03/05/20 0425 03/06/20 0251 03/07/20 0324 03/08/20 0339 03/09/20 0338  NA 136 136 136 134* 135  K 3.5 3.3* 3.7 4.3 4.2  CL 103 102 101 97* 96*  CO2 21* 22 22 25 25   GLUCOSE 127* 107* 105* 102* 121*  BUN 50* 56* 64* 71* 75*  CREATININE 3.37* 3.63* 3.88* 3.81* 3.76*  CALCIUM 8.8* 8.9 8.8* 9.1 9.3   Liver Function Tests: Recent Labs   Lab 03/04/20 0937  AST 28  ALT 15  ALKPHOS 71  BILITOT 0.8  PROT 7.1  ALBUMIN 3.5   No results for input(s): LIPASE, AMYLASE in the last 168 hours. No results for input(s): AMMONIA in the last 168 hours. CBC: Recent Labs  Lab 03/04/20 0937 03/05/20 0425 03/06/20 0251 03/07/20 0324 03/08/20 0339 03/09/20 0338  WBC 10.2 9.1 7.8 8.5 7.7 6.8  NEUTROABS 7.7  --   --   --   --   --   HGB 10.6* 9.1* 8.7* 8.5* 8.4* 8.5*  HCT 33.5* 27.0* 26.6* 25.4* 25.9* 26.1*  MCV 90.3 86.8 87.8 87.3 88.1 88.8  PLT 259 265 240 256 253 284   Cardiac Enzymes: No results for input(s): CKTOTAL, CKMB, CKMBINDEX, TROPONINI in the last 168 hours. BNP: Invalid input(s): POCBNP CBG: Recent Labs  Lab 03/08/20 1141 03/08/20 1647 03/08/20 2125 03/09/20 0633 03/09/20 1124  GLUCAP 168* 118* 171* 113* 125*   D-Dimer No results for input(s): DDIMER in the last 72 hours. Hgb A1c No results for input(s): HGBA1C in the last 72 hours. Lipid Profile No results for input(s): CHOL, HDL, LDLCALC, TRIG, CHOLHDL, LDLDIRECT in the last 72 hours. Thyroid function studies No results for input(s): TSH, T4TOTAL, T3FREE, THYROIDAB in the last 72 hours.  Invalid input(s): FREET3 Anemia work up No results for input(s): VITAMINB12, FOLATE, FERRITIN,  TIBC, IRON, RETICCTPCT in the last 72 hours. Urinalysis    Component Value Date/Time   COLORURINE YELLOW 02/12/2019 1215   APPEARANCEUR CLEAR 02/12/2019 1215   LABSPEC 1.015 02/12/2019 1215   PHURINE 6.5 02/12/2019 1215   GLUCOSEU NEGATIVE 02/12/2019 1215   HGBUR NEGATIVE 02/12/2019 1215   BILIRUBINUR NEGATIVE 02/12/2019 1215   KETONESUR NEGATIVE 02/12/2019 1215   PROTEINUR 100 (A) 04/10/2012 1929   UROBILINOGEN 0.2 02/12/2019 1215   NITRITE NEGATIVE 02/12/2019 1215   LEUKOCYTESUR NEGATIVE 02/12/2019 1215   Sepsis Labs Invalid input(s): PROCALCITONIN,  WBC,  LACTICIDVEN Microbiology Recent Results (from the past 240 hour(s))  Resp Panel by RT-PCR (Flu  A&B, Covid) Nasopharyngeal Swab     Status: None   Collection Time: 03/04/20  9:37 AM   Specimen: Nasopharyngeal Swab; Nasopharyngeal(NP) swabs in vial transport medium  Result Value Ref Range Status   SARS Coronavirus 2 by RT PCR NEGATIVE NEGATIVE Final    Comment: (NOTE) SARS-CoV-2 target nucleic acids are NOT DETECTED.  The SARS-CoV-2 RNA is generally detectable in upper respiratory specimens during the acute phase of infection. The lowest concentration of SARS-CoV-2 viral copies this assay can detect is 138 copies/mL. A negative result does not preclude SARS-Cov-2 infection and should not be used as the sole basis for treatment or other patient management decisions. A negative result may occur with  improper specimen collection/handling, submission of specimen other than nasopharyngeal swab, presence of viral mutation(s) within the areas targeted by this assay, and inadequate number of viral copies(<138 copies/mL). A negative result must be combined with clinical observations, patient history, and epidemiological information. The expected result is Negative.  Fact Sheet for Patients:  EntrepreneurPulse.com.au  Fact Sheet for Healthcare Providers:  IncredibleEmployment.be  This test is no t yet approved or cleared by the [place] FDA and  has been authorized for detection and/or diagnosis of SARS-CoV-2 by FDA under an Emergency Use Authorization (EUA). This EUA will remain  in effect (meaning this test can be used) for the duration of the COVID-19 declaration under Section 564(b)(1) of the Act, 21 U.S.C.section 360bbb-3(b)(1), unless the authorization is terminated  or revoked sooner.       Influenza A by PCR NEGATIVE NEGATIVE Final   Influenza B by PCR NEGATIVE NEGATIVE Final    Comment: (NOTE) The Xpert Xpress SARS-CoV-2/FLU/RSV plus assay is intended as an aid in the diagnosis of influenza from Nasopharyngeal swab specimens  and should not be used as a sole basis for treatment. Nasal washings and aspirates are unacceptable for Xpert Xpress SARS-CoV-2/FLU/RSV testing.  Fact Sheet for Patients: EntrepreneurPulse.com.au  Fact Sheet for Healthcare Providers: IncredibleEmployment.be  This test is not yet approved or cleared by the [place] FDA and has been authorized for detection and/or diagnosis of SARS-CoV-2 by FDA under an Emergency Use Authorization (EUA). This EUA will remain in effect (meaning this test can be used) for the duration of the COVID-19 declaration under Section 564(b)(1) of the Act, 21 U.S.C. section 360bbb-3(b)(1), unless the authorization is terminated or revoked.  Performed at [facility], [place] [address] [place], [place] [address]    Time spent: 30 min  SIGNED:   Marylu Lund, MD  [facility] 03/09/2020, 12:08 PM  If 7PM-7AM, please contact night-coverage

## 2020-03-09 NOTE — Progress Notes (Signed)
Physical Therapy Treatment Patient Details Name: Henry [name] MRN: [id number] DOB: [birth year] Today's Date: 03/09/2020    History of Present Illness Henry [name] is a 87 y.o. male with a hx of CAD status post CABG with graft failure, moderate to severe aortic stenosis, not a candidate for TAVR, chronic combined CHF, peripheral arterial disease, chronic kidney disease stage IV with failed dialysis access, hypertension, diabetes mellitus, hyperlipidemia second-degree type I AV block, and orthostatic intolerance who is being seen today for the evaluation of CHF    PT Comments    Patient progressing towards physical therapy goals. Patient on RA on arrival with spO2 >96%. With ambulation, spO2 dropped to 86%, instructed patient on pursed lip breathing with improvement to 90% after 2 minutes. Patient ambulated 120' with RW and min guard for safety. Patient continues to present with decreased activity tolerance, impaired balance, generalized weakness. Continue to recommend SNF for ongoing Physical Therapy.        Follow Up Recommendations  SNF;Supervision/Assistance - 24 hour     Equipment Recommendations  None recommended by PT    Recommendations for Other Services       Precautions / Restrictions Precautions Precautions: Fall Precaution Comments: watch O2 Restrictions Weight Bearing Restrictions: No    Mobility  Bed Mobility Overal bed mobility: Needs Assistance Bed Mobility: Supine to Sit;Sit to Supine     Supine to sit: Min assist Sit to supine: Min assist   General bed mobility comments: minA to pull trunk to upright position and bring B LEs back into bed  Transfers Overall transfer level: Needs assistance Equipment used: Rolling Lecretia Buczek (2 wheeled) Transfers: Sit to/from Stand Sit to Stand: Min assist         General transfer comment: minA for boost from low surface  Ambulation/Gait Ambulation/Gait assistance: Min guard Gait Distance (Feet): 120  Feet Assistive device: Rolling Nautia Lem (2 wheeled) Gait Pattern/deviations: Step-through pattern;Decreased stride length Gait velocity: decreased   General Gait Details: min guard for safety. Patient reports at times lightheadedness but no dizziness. On RA with ambulation, spO2 dropped to 86% with 2 minute recovery to 90% with instruction on pursed lip breathing   Stairs             Wheelchair Mobility    Modified Rankin (Stroke Patients Only)       Balance Overall balance assessment: Needs assistance Sitting-balance support: Feet supported Sitting balance-Leahy Scale: Good     Standing balance support: Bilateral upper extremity supported Standing balance-Leahy Scale: Poor                              Cognition Arousal/Alertness: Awake/alert Behavior During Therapy: WFL for tasks assessed/performed Overall Cognitive Status: Within Functional Limits for tasks assessed                                        Exercises      General Comments General comments (skin integrity, edema, etc.): O2 dropped to 86% with ambulation on RA, instructed on pursed lip breathing with improvement to 90% after 2 minutes      Pertinent Vitals/Pain Pain Assessment: No/denies pain    Home Living                      Prior Function  PT Goals (current goals can now be found in the care plan section) Acute Rehab PT Goals Patient Stated Goal: to get better PT Goal Formulation: With patient Time For Goal Achievement: 03/19/20 Potential to Achieve Goals: Fair Progress towards PT goals: Progressing toward goals    Frequency    Min 3X/week      PT Plan Current plan remains appropriate    Co-evaluation              AM-PAC PT "6 Clicks" Mobility   Outcome Measure  Help needed turning from your back to your side while in a flat bed without using bedrails?: None Help needed moving from lying on your back to sitting on the  side of a flat bed without using bedrails?: A Little Help needed moving to and from a bed to a chair (including a wheelchair)?: A Little Help needed standing up from a chair using your arms (e.g., wheelchair or bedside chair)?: A Little Help needed to walk in hospital room?: A Little Help needed climbing 3-5 steps with a railing? : A Lot 6 Click Score: 18    End of Session Equipment Utilized During Treatment: Gait belt Activity Tolerance: Patient tolerated treatment well Patient left: in bed;with call bell/phone within reach Nurse Communication: Mobility status PT Visit Diagnosis: Unsteadiness on feet (R26.81);Other abnormalities of gait and mobility (R26.89);Muscle weakness (generalized) (M62.81)     Time: 7915-0569 PT Time Calculation (min) (ACUTE ONLY): 28 min  Charges:  $Therapeutic Activity: 23-37 mins                     Lorene Samaan A. Gilford Rile PT, DPT Acute Rehabilitation Services Pager [phone] Office [phone]    Alda Lea 03/09/2020, 2:28 PM

## 2020-03-09 NOTE — Progress Notes (Signed)
Report called and given to [facility] LPN at [place].

## 2020-03-09 NOTE — Progress Notes (Signed)
Pt. Waiting to be discharged via [facility] to [facility]. Family waiting with pt. At bedside. [facility] contacted and stated that pt. Was #8 on the list for tranport tonight as of 1945. Family requested that patient be discharged in the am because of the late transfer. [facility] and [place] called to make aware. On call for [facility] paged to make aware.

## 2020-03-09 NOTE — TOC Transition Note (Signed)
Transition of Care [facility]) - CM/SW Discharge Note   Patient Details  Name: Henry [name] MRN: [id number] Date of Birth: 21-Dec-1924  [facility] CM/SW Contact:  Bethann Berkshire, [place] Phone Number: 03/09/2020, 4:07 PM   Clinical Narrative:     Patient will DC to: [facility] Anticipated DC date: 03/09/20 Family notified: Daughter [name] Transport by: Corey Harold called at 4p for next available   Per MD patient ready for DC to [facility]. RN, patient, patient's family, and facility notified of DC. Discharge Summary and FL2 sent to facility. RN to call report prior to discharge [phone]). DC packet on chart. Ambulance transport requested for patient.   CSW will sign off for now as social work intervention is no longer needed. Please consult [place] again if new needs arise.   Final next level of care: Skilled Nursing Facility Barriers to Discharge: No Barriers Identified   Patient Goals and CMS Choice Patient states their goals for this hospitalization and ongoing recovery are:: Pt wants rehab facility CMS Medicare.gov Compare Post Acute Care list provided to:: Patient Choice offered to / list presented to : [place]  Discharge Placement              Patient chooses bed at: [facility] Patient to be transferred to facility by: [place] Name of family member notified: Daughter [name] Patient and family notified of of transfer: 03/09/20  Discharge Plan and Services                                     Social Determinants of Health (SDOH) Interventions     Readmission Risk Interventions No flowsheet data found.

## 2020-03-10 ENCOUNTER — Telehealth: Payer: Self-pay

## 2020-03-10 DIAGNOSIS — I5041 Acute combined systolic (congestive) and diastolic (congestive) heart failure: Secondary | ICD-10-CM | POA: Diagnosis not present

## 2020-03-10 DIAGNOSIS — I248 Other forms of acute ischemic heart disease: Secondary | ICD-10-CM | POA: Diagnosis not present

## 2020-03-10 DIAGNOSIS — N185 Chronic kidney disease, stage 5: Secondary | ICD-10-CM | POA: Diagnosis not present

## 2020-03-10 DIAGNOSIS — D638 Anemia in other chronic diseases classified elsewhere: Secondary | ICD-10-CM | POA: Diagnosis not present

## 2020-03-10 DIAGNOSIS — I5020 Unspecified systolic (congestive) heart failure: Secondary | ICD-10-CM | POA: Diagnosis not present

## 2020-03-10 DIAGNOSIS — I959 Hypotension, unspecified: Secondary | ICD-10-CM | POA: Diagnosis not present

## 2020-03-10 DIAGNOSIS — I351 Nonrheumatic aortic (valve) insufficiency: Secondary | ICD-10-CM | POA: Diagnosis not present

## 2020-03-10 DIAGNOSIS — M6259 Muscle wasting and atrophy, not elsewhere classified, multiple sites: Secondary | ICD-10-CM | POA: Diagnosis not present

## 2020-03-10 DIAGNOSIS — E785 Hyperlipidemia, unspecified: Secondary | ICD-10-CM | POA: Diagnosis not present

## 2020-03-10 DIAGNOSIS — E039 Hypothyroidism, unspecified: Secondary | ICD-10-CM | POA: Diagnosis not present

## 2020-03-10 DIAGNOSIS — I359 Nonrheumatic aortic valve disorder, unspecified: Secondary | ICD-10-CM | POA: Diagnosis not present

## 2020-03-10 DIAGNOSIS — R41841 Cognitive communication deficit: Secondary | ICD-10-CM | POA: Diagnosis not present

## 2020-03-10 DIAGNOSIS — R0902 Hypoxemia: Secondary | ICD-10-CM | POA: Diagnosis not present

## 2020-03-10 DIAGNOSIS — I350 Nonrheumatic aortic (valve) stenosis: Secondary | ICD-10-CM | POA: Diagnosis not present

## 2020-03-10 DIAGNOSIS — I739 Peripheral vascular disease, unspecified: Secondary | ICD-10-CM | POA: Diagnosis not present

## 2020-03-10 DIAGNOSIS — E119 Type 2 diabetes mellitus without complications: Secondary | ICD-10-CM | POA: Diagnosis not present

## 2020-03-10 DIAGNOSIS — Z7401 Bed confinement status: Secondary | ICD-10-CM | POA: Diagnosis not present

## 2020-03-10 DIAGNOSIS — N184 Chronic kidney disease, stage 4 (severe): Secondary | ICD-10-CM | POA: Diagnosis not present

## 2020-03-10 DIAGNOSIS — D649 Anemia, unspecified: Secondary | ICD-10-CM | POA: Diagnosis not present

## 2020-03-10 DIAGNOSIS — I2510 Atherosclerotic heart disease of native coronary artery without angina pectoris: Secondary | ICD-10-CM | POA: Diagnosis not present

## 2020-03-10 DIAGNOSIS — E782 Mixed hyperlipidemia: Secondary | ICD-10-CM | POA: Diagnosis not present

## 2020-03-10 DIAGNOSIS — R5381 Other malaise: Secondary | ICD-10-CM | POA: Diagnosis not present

## 2020-03-10 DIAGNOSIS — Z951 Presence of aortocoronary bypass graft: Secondary | ICD-10-CM | POA: Diagnosis not present

## 2020-03-10 DIAGNOSIS — R2689 Other abnormalities of gait and mobility: Secondary | ICD-10-CM | POA: Diagnosis not present

## 2020-03-10 DIAGNOSIS — R42 Dizziness and giddiness: Secondary | ICD-10-CM | POA: Diagnosis not present

## 2020-03-10 DIAGNOSIS — I10 Essential (primary) hypertension: Secondary | ICD-10-CM | POA: Diagnosis not present

## 2020-03-10 DIAGNOSIS — R2681 Unsteadiness on feet: Secondary | ICD-10-CM | POA: Diagnosis not present

## 2020-03-10 DIAGNOSIS — M6281 Muscle weakness (generalized): Secondary | ICD-10-CM | POA: Diagnosis not present

## 2020-03-10 DIAGNOSIS — I150 Renovascular hypertension: Secondary | ICD-10-CM | POA: Diagnosis not present

## 2020-03-10 DIAGNOSIS — I5040 Unspecified combined systolic (congestive) and diastolic (congestive) heart failure: Secondary | ICD-10-CM | POA: Diagnosis not present

## 2020-03-10 DIAGNOSIS — I509 Heart failure, unspecified: Secondary | ICD-10-CM | POA: Diagnosis not present

## 2020-03-10 DIAGNOSIS — M2550 Pain in unspecified joint: Secondary | ICD-10-CM | POA: Diagnosis not present

## 2020-03-10 LAB — GLUCOSE, CAPILLARY: Glucose-Capillary: 154 mg/dL — ABNORMAL HIGH (ref 70–99)

## 2020-03-10 NOTE — Discharge Summary (Signed)
Physician Discharge Summary  DRAVEN [name] ERD:[id number] DOB: 03-05-1924 DOA: 03/04/2020  PCP: Henry Borg, MD  Admit date: 03/04/2020 Discharge date: 03/10/2020  Admitted From: Home Disposition:  SNF  Recommendations for Outpatient Follow-up:  1. Follow up with PCP in 1-2 weeks 2. Follow up with Dr. Burt [name] in 3 months. Per Cardiology, consider adding beta blocker as an outpatient if HR will allow 3. Recommend repeat TSH in 4-6 weeks to ensure euthyroid state  Discharge Condition:Improved CODE STATUS:DNR Diet recommendation: Heart healthy   Brief/Interim Summary: 87 y.o.malewith medical history significant ofcombined systolic and diastolic CHF last EF 56-31%,SHFWYOVZ to severe AR, CAD s/pCABG, PAD, HTN, HLD, DM type II, CKD stage IV presents with complaints of being unable to unable to breathe over the last week, but acutely worse over the last 2 days. Pt was treated for acute CHF exacerbation with IV lasix, ultimately transitioned to PO lasix. Cardiology had been following and have now signed off. He is improved.  ADDENDUM 03/10/20: Patient was medically stable to D/C to SNF yesterday but unfortunately was not able to go due to Transportation issues. Remains medically stable to D/C today and had no acute events overnight.   Discharge Diagnoses:  Principal Problem:   Systolic congestive heart failure (HCC) Active Problems:   HYPERCHOLESTEROLEMIA   CAD (coronary artery disease)   Hypothyroidism   Hypertension   Respiratory failure with hypoxia (HCC)  Acute respiratory failure secondary to acute systolic congestive heart failure -Patient presented with shortness of breath and was found to be hypoxic down to 86% on room air -Noted to have severe faint crackles on physical exam on admission and required 6 L of nasal cannula.  Currently on high flow nasal cannula. -Chest x-ray showed interstitial opacities -BNP noted to be elevated -Patient placed on IV Lasix 80 mg twice daily-  then decreased to 40mg  IV BID, later transitioned to oral lasix 40mg  daily  -Echocardiogram EF 30 to 85%, grade 1 diastolic dysfunction.  Moderate aortic stenosis -Cardiology consulted and appreciate. Cardiology has since signed off  Elevated troponin/NSTEMI -Patient reports having chest pressure on admission -High-sensitivity troponin 344, 760 (although patient appears to chronically have an elevated troponin) -History of CABG -Not on a beta-blocker due to history of bradycardia -Was placed on IV heparin, transition to subcu prophylactic dose later -Continue aspirin and statin  Dizziness -Likely multifactorial including chronic anemia versus diuresis  CKD, stage IV -Patient follows with nephrology, Dr. Marval Regal as an outpatient -Slight bump in creatinine which has since remained stable  Essential hypertension -Continue amlodipine, Lasix  Anemia of chronic disease -hemoglobin 8.4, appears to be close to baseline (7-9) -Patient has been receiving darbepoetin alpha every 30 days as well as on iron supplementation -Hgb has remained stable  Hypothyroidism -TSH 6.507- would recommend that thyroid function be retested in 4-6 weeks after acute illness/CHF exac has resolved -Continue Synthroid as tolerated  Hyperlipidemia -Continue statin  Diabetes mellitus, type -Last hemoglobin A1c was 5.7 on 12/03/2019 -Currently not on any diabetic medications, likely diet controlled -Continue to monitor  Goals of care -Patient is a DNR -Appears that patient is being followed by for care in the community  Weakness/deconditioning -Suspect secondary to the above -PT and OT evaluated patient and feel patient may be home health appropriate but may need supervision, will continue to work with patient  Hypokalemia -Resolved with supplementation, continue to monitor BMP  Discharge Instructions   Allergies as of 03/10/2020   No Known Allergies     Medication  List    STOP  taking these medications   amLODipine 10 MG tablet Commonly known as: NORVASC     TAKE these medications   accu-chek multiclix lancets USE TWICE DAILY.   aspirin EC 81 MG tablet Take 81 mg by mouth daily.   atorvastatin 40 MG tablet Commonly known as: LIPITOR Take 1 tablet (40 mg total) by mouth every evening.   b complex-vitamin c-folic acid 0.8 MG Tabs tablet Take 1 tablet by mouth at bedtime.   calcitRIOL 0.25 MCG capsule Commonly known as: ROCALTROL Take 0.25 mcg by mouth once a week. Monday   calcium acetate 667 MG capsule Commonly known as: PHOSLO Take 667 mg by mouth 2 (two) times daily with a meal.   Darbepoetin Alfa 100 MCG/0.5ML Sosy injection Commonly known as: ARANESP Inject 100 mcg into the skin every 30 (thirty) days.   diphenoxylate-atropine 2.5-0.025 MG tablet Commonly known as: Lomotil Take 1 tablet by mouth 4 (four) times daily as needed for diarrhea or loose stools.   ferrous gluconate 324 MG tablet Commonly known as: FERGON Take 324 mg by mouth daily.   furosemide 40 MG tablet Commonly known as: LASIX Take 1 tablet (40 mg total) by mouth every other day. Also take additional dose for weight greater than 137 lb   hydrALAZINE 10 MG tablet Commonly known as: APRESOLINE Take 1 tablet (10 mg total) by mouth every 8 (eight) hours. Notes to patient: **NEW** To lower blood pressure and treat heart failure   isosorbide mononitrate 30 MG 24 hr tablet Commonly known as: IMDUR Take 1 tablet (30 mg total) by mouth daily. Notes to patient: **NEW** To lower blood pressure, prevent chest pain and treat heart failure   levothyroxine 50 MCG tablet Commonly known as: SYNTHROID Take 50 mcg by mouth daily before breakfast.   nitroGLYCERIN 0.4 MG SL tablet Commonly known as: NITROSTAT Place 1 tablet (0.4 mg total) under the tongue every 5 (five) minutes as needed for chest pain.   PRESERVISION AREDS 2 PO Take 1 tablet by mouth daily.   Systane Balance 0.6  % Soln Generic drug: Propylene Glycol Place 1 drop into both eyes daily as needed (dry eyes).       Follow-up Information    Henry Borg, MD. Schedule an appointment as soon as possible for a visit in 2 week(s).   Specialties: Internal Medicine, Radiology Contact information: [place] [address] [phone]        [name] T, PA-C Follow up on 03/14/2020.   Specialties: Cardiology, Physician Assistant Why: at 11:15am  Contact information: [address] [place] [address] [phone]              No Known Allergies  Consultations:  Cardiology  Procedures/Studies: DG Chest Portable 1 View  Result Date: 03/04/2020 CLINICAL DATA:  Shortness of breath Decreased O2 saturation Increased fatigue History of CHF EXAM: PORTABLE CHEST 1 VIEW COMPARISON:  11/11/2019 FINDINGS: Unchanged mild cardiomegaly. Postop changes of CABG again seen. There has been interval worsening of pulmonary vascular congestion. Bilateral airspace opacities have increased since prior exam, more focal in the right lower lobe lung. IMPRESSION: Interval worsening of bilateral lung opacities and pulmonary vascular congestion most consistent with worsening CHF. Given that there is asymmetric increased density in the right lower lung, findings may also be related to concurrent pneumonia. Electronically Signed   By: [name] M.D.   On: 03/04/2020 09:35   ECHOCARDIOGRAM COMPLETE  Result  Date: 03/05/2020    ECHOCARDIOGRAM REPORT   Patient Name:   Henry [name] [facility] Date of Exam: 03/05/2020 Medical Rec #:  [id number]        Height:       69.0 in Accession #:    [id number]       Weight:       146.2 lb Date of Birth:  03-08-1924         BSA:          1.808 m Patient Age:    27 years         BP:           137/62 mmHg Patient Gender: M                HR:           86 bpm. Exam Location:  Inpatient Procedure: 2D Echo, Cardiac Doppler and Color Doppler Indications:    CHF-Acute  Systolic  History:        Patient has prior history of Echocardiogram examinations, most                 recent 11/10/2019. Previous Myocardial Infarction and CAD, Aortic                 Valve Disease, Signs/Symptoms:Shortness of Breath; Risk                 Factors:Hypertension, Dyslipidemia and Diabetes. CKD.  Sonographer:    [name] Lefort RDCS (AE) Referring Phys: [id number] RONDELL A SMITH IMPRESSIONS  1. Left ventricular ejection fraction, by estimation, is 30 to 35%. The left ventricle has moderately decreased function. The left ventricle demonstrates global hypokinesis. Left ventricular diastolic parameters are consistent with Grade I diastolic dysfunction (impaired relaxation).  2. Right ventricular systolic function is normal. The right ventricular size is normal. There is mildly elevated pulmonary artery systolic pressure.  3. Left atrial size was moderately dilated.  4. The mitral valve is normal in structure. Mild mitral valve regurgitation. No evidence of mitral stenosis.  5. Mean gradient is consistent with mild aortic stenosis, unchanged form 10/2019. However, dimensionless index is 0.24. This is decreased from 0.26 on 10/2019. Consistent with at least moderate aortic stenosis. Cannot exclude low-flow, low-gradient aortic  stenosis. The aortic valve is calcified. There is moderate calcification of the aortic valve. There is moderate thickening of the aortic valve. Aortic valve regurgitation is mild. Moderate to severe aortic valve stenosis. Aortic valve area, by VTI measures 0.76 cm. Aortic valve mean gradient measures 17.0 mmHg. Aortic valve Vmax measures 2.68 m/s.  6. The inferior vena cava is normal in size with <50% respiratory variability, suggesting right atrial pressure of 8 mmHg. FINDINGS  Left Ventricle: Left ventricular ejection fraction, by estimation, is 30 to 35%. The left ventricle has moderately decreased function. The left ventricle demonstrates global hypokinesis. The left ventricular  internal cavity size was normal in size. There is no left ventricular hypertrophy. Left ventricular diastolic parameters are consistent with Grade I diastolic dysfunction (impaired relaxation). Right Ventricle: The right ventricular size is normal. No increase in right ventricular wall thickness. Right ventricular systolic function is normal. There is mildly elevated pulmonary artery systolic pressure. The tricuspid regurgitant velocity is 2.82  m/s, and with an assumed right atrial pressure of 8 mmHg, the estimated right ventricular systolic pressure is 81.1 mmHg. Left Atrium: Left atrial size was moderately dilated. Right Atrium: Right atrial size was normal in size. Pericardium: There is no evidence of  pericardial effusion. Mitral Valve: The mitral valve is normal in structure. Mild mitral valve regurgitation. No evidence of mitral valve stenosis. MV peak gradient, 10.6 mmHg. The mean mitral valve gradient is 4.0 mmHg. Tricuspid Valve: The tricuspid valve is normal in structure. Tricuspid valve regurgitation is mild . No evidence of tricuspid stenosis. Aortic Valve: Mean gradient is consistent with mild aortic stenosis, unchanged form 10/2019. However, dimensionless index is 0.24. This is decreased from 0.26 on 10/2019. Consistent with at least moderate aortic stenosis. Cannot exclude low-flow, low-gradient aortic stenosis. The aortic valve is calcified. There is moderate calcification of the aortic valve. There is moderate thickening of the aortic valve. Aortic valve regurgitation is mild. Aortic regurgitation PHT measures 240 msec. Moderate to severe aortic stenosis is present. Aortic valve mean gradient measures 17.0 mmHg. Aortic valve peak gradient measures 28.6 mmHg. Aortic valve area, by VTI measures 0.76 cm. Pulmonic Valve: The pulmonic valve was normal in structure. Pulmonic valve regurgitation is not visualized. No evidence of pulmonic stenosis. Aorta: The aortic root is normal in size and structure.  Venous: The inferior vena cava is normal in size with less than 50% respiratory variability, suggesting right atrial pressure of 8 mmHg. IAS/Shunts: No atrial level shunt detected by color flow Doppler.  LEFT VENTRICLE PLAX 2D LVIDd:         4.60 cm  Diastology LVIDs:         3.90 cm  LV e' medial:    2.03 cm/s LV PW:         1.20 cm  LV E/e' medial:  57.1 LV IVS:        1.30 cm  LV e' lateral:   6.45 cm/s LVOT diam:     2.00 cm  LV E/e' lateral: 18.0 LV SV:         43 LV SV Index:   24 LVOT Area:     3.14 cm                          3D Volume EF:                         3D EF:        32 %                         LV EDV:       187 ml                         LV ESV:       127 ml                         LV SV:        60 ml RIGHT VENTRICLE            IVC RV Basal diam:  2.60 cm    IVC diam: 1.40 cm RV S prime:     9.75 cm/s TAPSE (M-mode): 1.9 cm LEFT ATRIUM             Index       RIGHT ATRIUM           Index LA diam:        4.00 cm 2.21 cm/m  RA Area:     14.20 cm LA Vol (A2C):   55.9 ml 30.91 ml/m  RA Volume:   30.30 ml  16.76 ml/m LA Vol (A4C):   62.7 ml 34.67 ml/m LA Biplane Vol: 61.1 ml 33.79 ml/m  AORTIC VALVE AV Area (Vmax):    0.84 cm AV Area (Vmean):   0.75 cm AV Area (VTI):     0.76 cm AV Vmax:           267.50 cm/s AV Vmean:          196.500 cm/s AV VTI:            0.568 m AV Peak Grad:      28.6 mmHg AV Mean Grad:      17.0 mmHg LVOT Vmax:         71.80 cm/s LVOT Vmean:        46.700 cm/s LVOT VTI:          0.137 m LVOT/AV VTI ratio: 0.24 AI PHT:            240 msec  AORTA Ao Root diam: 3.80 cm Ao Asc diam:  3.80 cm MITRAL VALVE                TRICUSPID VALVE MV Area (PHT): 5.97 cm     TR Peak grad:   31.8 mmHg MV Peak grad:  10.6 mmHg    TR Vmax:        282.00 cm/s MV Mean grad:  4.0 mmHg MV Vmax:       1.63 m/s     SHUNTS MV Vmean:      87.6 cm/s    Systemic VTI:  0.14 m MV Decel Time: 127 msec     Systemic Diam: 2.00 cm MV E velocity: 116.00 cm/s MV A velocity: 143.00 cm/s MV E/A ratio:  0.81  Skeet Latch MD Electronically signed by Skeet Latch MD Signature Date/Time: 03/05/2020/3:16:34 PM    Final     Subjective: Eager to go to SNF  Discharge Exam: Vitals:   03/10/20 0433 03/10/20 0836  BP: 131/69 (!) 147/64  Pulse: 77 80  Resp:  18  Temp: 98 F (36.7 C)   SpO2: 92% 96%   Vitals:   03/09/20 2200 03/09/20 2300 03/10/20 0433 03/10/20 0836  BP:   131/69 (!) 147/64  Pulse:   77 80  Resp: 18 16  18   Temp:   98 F (36.7 C)   TempSrc:   Oral   SpO2:   92% 96%  Weight:      Height:        General: Pt is alert, awake, not in acute distress Cardiovascular: RRR, S1/S2 +, no rubs, no gallops Respiratory: CTA bilaterally, no wheezing, no rhonchi Abdominal: Soft, NT, ND, bowel sounds + Extremities: no edema, no cyanosis   The results of significant diagnostics from this hospitalization (including imaging, microbiology, ancillary and laboratory) are listed below for reference.     Microbiology: Recent Results (from the past 240 hour(s))  Resp Panel by RT-PCR (Flu A&B, Covid) Nasopharyngeal Swab     Status: None   Collection Time: 03/04/20  9:37 AM   Specimen: Nasopharyngeal Swab; Nasopharyngeal(NP) swabs in vial transport medium  Result Value Ref Range Status   SARS Coronavirus 2 by RT PCR NEGATIVE NEGATIVE Final    Comment: (NOTE) SARS-CoV-2 target nucleic acids are NOT DETECTED.  The SARS-CoV-2 RNA is generally detectable in upper respiratory specimens during the acute phase of infection. The lowest concentration of SARS-CoV-2 viral copies this assay can detect is 138 copies/mL. A negative result  does not preclude SARS-Cov-2 infection and should not be used as the sole basis for treatment or other patient management decisions. A negative result may occur with  improper specimen collection/handling, submission of specimen other than nasopharyngeal swab, presence of viral mutation(s) within the areas targeted by this assay, and inadequate number of  viral copies(<138 copies/mL). A negative result must be combined with clinical observations, patient history, and epidemiological information. The expected result is Negative.  Fact Sheet for Patients:  EntrepreneurPulse.com.au  Fact Sheet for Healthcare Providers:  IncredibleEmployment.be  This test is no t yet approved or cleared by the [place] FDA and  has been authorized for detection and/or diagnosis of SARS-CoV-2 by FDA under an Emergency Use Authorization (EUA). This EUA will remain  in effect (meaning this test can be used) for the duration of the COVID-19 declaration under Section 564(b)(1) of the Act, 21 U.S.C.section 360bbb-3(b)(1), unless the authorization is terminated  or revoked sooner.       Influenza A by PCR NEGATIVE NEGATIVE Final   Influenza B by PCR NEGATIVE NEGATIVE Final    Comment: (NOTE) The Xpert Xpress SARS-CoV-2/FLU/RSV plus assay is intended as an aid in the diagnosis of influenza from Nasopharyngeal swab specimens and should not be used as a sole basis for treatment. Nasal washings and aspirates are unacceptable for Xpert Xpress SARS-CoV-2/FLU/RSV testing.  Fact Sheet for Patients: EntrepreneurPulse.com.au  Fact Sheet for Healthcare Providers: IncredibleEmployment.be  This test is not yet approved or cleared by the [place] FDA and has been authorized for detection and/or diagnosis of SARS-CoV-2 by FDA under an Emergency Use Authorization (EUA). This EUA will remain in effect (meaning this test can be used) for the duration of the COVID-19 declaration under Section 564(b)(1) of the Act, 21 U.S.C. section 360bbb-3(b)(1), unless the authorization is terminated or revoked.  Performed at [facility], [place] [address] [place], [place] [address]   SARS CORONAVIRUS 2 (TAT 6-24 HRS) Nasopharyngeal Nasopharyngeal Swab     Status: None   Collection Time: 03/09/20   8:57 AM   Specimen: Nasopharyngeal Swab  Result Value Ref Range Status   SARS Coronavirus 2 NEGATIVE NEGATIVE Final    Comment: (NOTE) SARS-CoV-2 target nucleic acids are NOT DETECTED.  The SARS-CoV-2 RNA is generally detectable in upper and lower respiratory specimens during the acute phase of infection. Negative results do not preclude SARS-CoV-2 infection, do not rule out co-infections with other pathogens, and should not be used as the sole basis for treatment or other patient management decisions. Negative results must be combined with clinical observations, patient history, and epidemiological information. The expected result is Negative.  Fact Sheet for Patients: SugarRoll.be  Fact Sheet for Healthcare Providers: https://www.woods-mathews.com/  This test is not yet approved or cleared by the [place] FDA and  has been authorized for detection and/or diagnosis of SARS-CoV-2 by FDA under an Emergency Use Authorization (EUA). This EUA will remain  in effect (meaning this test can be used) for the duration of the COVID-19 declaration under Se ction 564(b)(1) of the Act, 21 U.S.C. section 360bbb-3(b)(1), unless the authorization is terminated or revoked sooner.  Performed at [facility], [place] [address], [place], [place] [address]      Labs: BNP (last 3 results) Recent Labs    11/09/19 1006 03/04/20 0937  BNP 979.3* 751.0*   Basic Metabolic Panel: Recent Labs  Lab 03/05/20 0425 03/06/20 0251 03/07/20 0324 03/08/20 0339 03/09/20 0338  NA 136 136 136 134* 135  K 3.5 3.3* 3.7 4.3 4.2  CL 103 102 101 97* 96*  CO2 21* 22 22 25 25   GLUCOSE 127* 107* 105* 102* 121*  BUN 50* 56* 64* 71* 75*  CREATININE 3.37* 3.63* 3.88* 3.81* 3.76*  CALCIUM 8.8* 8.9 8.8* 9.1 9.3   Liver Function Tests: Recent Labs  Lab 03/04/20 0937  AST 28  ALT 15  ALKPHOS 71  BILITOT 0.8  PROT 7.1  ALBUMIN 3.5   No results for  input(s): LIPASE, AMYLASE in the last 168 hours. No results for input(s): AMMONIA in the last 168 hours. CBC: Recent Labs  Lab 03/04/20 0937 03/05/20 0425 03/06/20 0251 03/07/20 0324 03/08/20 0339 03/09/20 0338  WBC 10.2 9.1 7.8 8.5 7.7 6.8  NEUTROABS 7.7  --   --   --   --   --   HGB 10.6* 9.1* 8.7* 8.5* 8.4* 8.5*  HCT 33.5* 27.0* 26.6* 25.4* 25.9* 26.1*  MCV 90.3 86.8 87.8 87.3 88.1 88.8  PLT 259 265 240 256 253 284   Cardiac Enzymes: No results for input(s): CKTOTAL, CKMB, CKMBINDEX, TROPONINI in the last 168 hours. BNP: Invalid input(s): POCBNP CBG: Recent Labs  Lab 03/08/20 2125 03/09/20 0633 03/09/20 1124 03/09/20 1551 03/10/20 0832  GLUCAP 171* 113* 125* 139* 154*   D-Dimer No results for input(s): DDIMER in the last 72 hours. Hgb A1c No results for input(s): HGBA1C in the last 72 hours. Lipid Profile No results for input(s): CHOL, HDL, LDLCALC, TRIG, CHOLHDL, LDLDIRECT in the last 72 hours. Thyroid function studies No results for input(s): TSH, T4TOTAL, T3FREE, THYROIDAB in the last 72 hours.  Invalid input(s): FREET3 Anemia work up No results for input(s): VITAMINB12, FOLATE, FERRITIN, TIBC, IRON, RETICCTPCT in the last 72 hours. Urinalysis    Component Value Date/Time   COLORURINE YELLOW 02/12/2019 1215   APPEARANCEUR CLEAR 02/12/2019 1215   LABSPEC 1.015 02/12/2019 1215   PHURINE 6.5 02/12/2019 1215   GLUCOSEU NEGATIVE 02/12/2019 1215   HGBUR NEGATIVE 02/12/2019 1215   BILIRUBINUR NEGATIVE 02/12/2019 1215   KETONESUR NEGATIVE 02/12/2019 1215   PROTEINUR 100 (A) 04/10/2012 1929   UROBILINOGEN 0.2 02/12/2019 1215   NITRITE NEGATIVE 02/12/2019 1215   LEUKOCYTESUR NEGATIVE 02/12/2019 1215   Sepsis Labs Invalid input(s): PROCALCITONIN,  WBC,  LACTICIDVEN Microbiology Recent Results (from the past 240 hour(s))  Resp Panel by RT-PCR (Flu A&B, Covid) Nasopharyngeal Swab     Status: None   Collection Time: 03/04/20  9:37 AM   Specimen:  Nasopharyngeal Swab; Nasopharyngeal(NP) swabs in vial transport medium  Result Value Ref Range Status   SARS Coronavirus 2 by RT PCR NEGATIVE NEGATIVE Final    Comment: (NOTE) SARS-CoV-2 target nucleic acids are NOT DETECTED.  The SARS-CoV-2 RNA is generally detectable in upper respiratory specimens during the acute phase of infection. The lowest concentration of SARS-CoV-2 viral copies this assay can detect is 138 copies/mL. A negative result does not preclude SARS-Cov-2 infection and should not be used as the sole basis for treatment or other patient management decisions. A negative result may occur with  improper specimen collection/handling, submission of specimen other than nasopharyngeal swab, presence of viral mutation(s) within the areas targeted by this assay, and inadequate number of viral copies(<138 copies/mL). A negative result must be combined with clinical observations, patient history, and epidemiological information. The expected result is Negative.  Fact Sheet for Patients:  EntrepreneurPulse.com.au  Fact Sheet for Healthcare Providers:  IncredibleEmployment.be  This test is no t yet approved or cleared by the [place]  States FDA and  has been authorized for detection and/or diagnosis of SARS-CoV-2 by FDA under an Emergency Use Authorization (EUA). This EUA will remain  in effect (meaning this test can be used) for the duration of the COVID-19 declaration under Section 564(b)(1) of the Act, 21 U.S.C.section 360bbb-3(b)(1), unless the authorization is terminated  or revoked sooner.       Influenza A by PCR NEGATIVE NEGATIVE Final   Influenza B by PCR NEGATIVE NEGATIVE Final    Comment: (NOTE) The Xpert Xpress SARS-CoV-2/FLU/RSV plus assay is intended as an aid in the diagnosis of influenza from Nasopharyngeal swab specimens and should not be used as a sole basis for treatment. Nasal washings and aspirates are unacceptable for  Xpert Xpress SARS-CoV-2/FLU/RSV testing.  Fact Sheet for Patients: EntrepreneurPulse.com.au  Fact Sheet for Healthcare Providers: IncredibleEmployment.be  This test is not yet approved or cleared by the [place] FDA and has been authorized for detection and/or diagnosis of SARS-CoV-2 by FDA under an Emergency Use Authorization (EUA). This EUA will remain in effect (meaning this test can be used) for the duration of the COVID-19 declaration under Section 564(b)(1) of the Act, 21 U.S.C. section 360bbb-3(b)(1), unless the authorization is terminated or revoked.  Performed at [facility], [place] [address], [place], [place] [address]   SARS CORONAVIRUS 2 (TAT 6-24 HRS) Nasopharyngeal Nasopharyngeal Swab     Status: None   Collection Time: 03/09/20  8:57 AM   Specimen: Nasopharyngeal Swab  Result Value Ref Range Status   SARS Coronavirus 2 NEGATIVE NEGATIVE Final    Comment: (NOTE) SARS-CoV-2 target nucleic acids are NOT DETECTED.  The SARS-CoV-2 RNA is generally detectable in upper and lower respiratory specimens during the acute phase of infection. Negative results do not preclude SARS-CoV-2 infection, do not rule out co-infections with other pathogens, and should not be used as the sole basis for treatment or other patient management decisions. Negative results must be combined with clinical observations, patient history, and epidemiological information. The expected result is Negative.  Fact Sheet for Patients: SugarRoll.be  Fact Sheet for Healthcare Providers: https://www.woods-mathews.com/  This test is not yet approved or cleared by the [place] FDA and  has been authorized for detection and/or diagnosis of SARS-CoV-2 by FDA under an Emergency Use Authorization (EUA). This EUA will remain  in effect (meaning this test can be used) for the duration of the COVID-19 declaration under  Se ction 564(b)(1) of the Act, 21 U.S.C. section 360bbb-3(b)(1), unless the authorization is terminated or revoked sooner.  Performed at [facility], [place] [address] [place], [place] [address]    Time spent: 35 min  SIGNED:  Raiford Noble, DO  [facility] 03/10/2020, 9:09 AM  If 7PM-7AM, please contact night-coverage

## 2020-03-10 NOTE — TOC Transition Note (Signed)
Transition of Care [facility]) - CM/SW Discharge Note   Patient Details  Name: Henry [name] MRN: [id number] Date of Birth: [birth year]  [facility] CM/SW Contact:  Bethann Berkshire, [place] Phone Number: 03/10/2020, 9:17 AM   Clinical Narrative:    Patient will DC to: [place] Anticipated DC date: 03/10/20 Family notified: Daughter [name] Transport by: Corey Harold   Per MD patient ready for DC to [place]. RN, patient, patient's family, and facility notified of DC. Discharge Summary and FL2 sent to facility. RN to call report prior to discharge([phone]). DC packet on chart. Ambulance transport requested for patient.   CSW will sign off for now as social work intervention is no longer needed. Please consult [place] again if new needs arise.   Final next level of care: Skilled Nursing Facility Barriers to Discharge: No Barriers Identified   Patient Goals and CMS Choice Patient states their goals for this hospitalization and ongoing recovery are:: Pt wants rehab facility CMS Medicare.gov Compare Post Acute Care list provided to:: Patient Choice offered to / list presented to : [place]  Discharge Placement              Patient [facility] bed at: [facility] Patient to be transferred to facility by: [place] Name of family member notified: Daughter [name] Patient and family notified of of transfer: 03/09/20  Discharge Plan and Services                                     Social Determinants of Health (SDOH) Interventions     Readmission Risk Interventions No flowsheet data found.

## 2020-03-10 NOTE — Telephone Encounter (Signed)
Transition Care Management Unsuccessful Follow-up Telephone Call  Date of discharge and from where:  03/10/2020 from [facility]  Attempts:  1st Attempt  Reason for unsuccessful TCM follow-up call:  Unable to leave message

## 2020-03-10 NOTE — Care Management Important Message (Addendum)
Important Message  Patient Details  Name: Henry [name] MRN: [id number] Date of Birth: Jun 19, 1924   Medicare Important Message Given:  Yes, pt. D/c mailed IM letter to pt. home.     Holli Humbles Smith 03/10/2020, 10:35 AM

## 2020-03-11 ENCOUNTER — Telehealth: Payer: Self-pay

## 2020-03-11 DIAGNOSIS — E119 Type 2 diabetes mellitus without complications: Secondary | ICD-10-CM | POA: Diagnosis not present

## 2020-03-11 DIAGNOSIS — I2510 Atherosclerotic heart disease of native coronary artery without angina pectoris: Secondary | ICD-10-CM | POA: Diagnosis not present

## 2020-03-11 DIAGNOSIS — R2689 Other abnormalities of gait and mobility: Secondary | ICD-10-CM | POA: Diagnosis not present

## 2020-03-11 DIAGNOSIS — I351 Nonrheumatic aortic (valve) insufficiency: Secondary | ICD-10-CM | POA: Diagnosis not present

## 2020-03-11 DIAGNOSIS — I5040 Unspecified combined systolic (congestive) and diastolic (congestive) heart failure: Secondary | ICD-10-CM | POA: Diagnosis not present

## 2020-03-11 DIAGNOSIS — Z951 Presence of aortocoronary bypass graft: Secondary | ICD-10-CM | POA: Diagnosis not present

## 2020-03-11 DIAGNOSIS — N184 Chronic kidney disease, stage 4 (severe): Secondary | ICD-10-CM | POA: Diagnosis not present

## 2020-03-11 NOTE — Telephone Encounter (Signed)
Transition Care Management Unsuccessful Follow-up Telephone Call  Date of discharge and from where:  03/10/2020 from [facility]  Attempts:  2nd Attempt  Reason for unsuccessful TCM follow-up call:  Unable to leave message  Nurse Note: Attempted to contact patient to discuss transition of care from inpatient admission.  Patient did not answer the phone.  Unable to leave voicemail as it was not set up.  Will attempt to call again or follow up.

## 2020-03-13 NOTE — Progress Notes (Signed)
Virtual Visit via Video Note   This visit type was conducted due to national recommendations for restrictions regarding the COVID-19 Pandemic (e.g. social distancing) in an effort to limit this patient's exposure and mitigate transmission in our community.  Due to his co-morbid illnesses, this patient is at least at moderate risk for complications without adequate follow up.  This format is felt to be most appropriate for this patient at this time.  All issues noted in this document were discussed and addressed.  A limited physical exam was performed with this format.  Please refer to the patient's chart for his consent to telehealth for [facility].       Date:  03/14/2020   ID:  Henry [name], DOB [birth year], MRN [id number] The patient was identified using 2 identifiers.  [name] Provider Location: Home Office  PCP:  Henry Borg, MD  Cardiologist:  Henry Mocha, MD   Electrophysiologist:  None   Evaluation Performed:  Follow-Up Visit  Chief Complaint:  Hospitalization Follow-up (CHF)    Patient Profile: Henry [name] is a 87 y.o. male with:  Coronary artery disease  ? S/p CABG in 2000 ? S/p NSTEMI 02/2018 in setting of acute diastolic CHF + IFO>>2/7 grafts patent (chronic occlusions)>>Med Rx  Aortic stenosis ? Severe (Echocardiogram 9/21): mean 18 mmHg, DI 0.26 - low flow low gradient) ? Echocardiogram 1/22: Mean 17 mmHg, DI 0.24, V-max 268 m/s ? Not a candidate for TAVR ? Risk of progression to ESRD outweighed benefit of treating AS >> palliative care  Heart failure with reduced ejection fraction  ? Echocardiogram 1/22: EF 30-35 ? Echocardiogram 9/21: EF 35-40 ? Echocardiogram 4/21: EF 50-55 ? Echocardiogram 02/2018: EF 50-55  Peripheral arterial disease - med Rx   Chronic kidney disease, stage V ? AVF failed; no dialysis access   ? 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 During AVF placement in 02/2018 >> beta-blocker DC'd   Orthostatic intolerance   DNR    Prior CV studies: Echocardiogram 03/05/2020  EF 30-35, global HK, G1 DD, normal RVSF, mildly elevated PASP, moderate LAE, mild MR, at least moderate aortic stenosis (cannot exclude low-flow, low gradient aortic stenosis), DI 0.24, V-max 268 m/s, mean AV gradient 17 mmHg  Echocardiogram 11/10/19 EF 35-40, global HK, mild reduced RVSF, normal PASP, mild LAE, mild MR, severe Ca2+ of AV, mod AI, mod AS (mean 18 mmHg, Vmax 287 cm/s, DI 0.26 - gradient underestimated in setting of LV dysfn - at least mod to severe low flow low gradient AS), Ao root 37 mm, asc Ao 38 mm  Echocardiogram 06/18/19 Inf/inf-lat HK, EF 50-55, mod LVH, Gr 2 DD, normal RVSF, mod LAE, mild MR, mild AI, mod AS (mean 25 mmHg), RVSP 45 mmHg  Cardiac catheterization 03/27/2018 LAD proximal 100 OM1 ostial 90 RCA ostial 100 (left to right collaterals) SVG-RPDA 100 SVG-OM1 100 SVG-D1 patent SVG-LAD patent 1. Severe native 3 vessel disease with total occlusion of the RCA and LAD, severe stenosis of the left circumflex 2. S/P remote CABG with continued patency of the LIMA-LAD and SVG-diagonal, total occlusion of the SVG-RCA and SVG-PDA Recommend medical management of CAD. No clear culprit lesion. Both SVG occlusions appear chronic. No targets for PCI.  Echocardiogram 03/26/2018 EF 50-55, prob inf-lat and lat HK, mild to mod MR, mod AS (AVA 0.93; mean 19.5; peak 36.7)  LE Venous US 03/25/2018 Summary:  Right: There is no evidence of deep vein thrombosis in the lower extremity. No cystic structure found in the popliteal fossa. Left: There is no evidence of deep vein thrombosis in the lower extremity. No cystic structure found in the popliteal fossa.  ABI/Arterial US 11/15/17 Final Interpretation: Right: Resting right ankle-brachial index indicates mild right lower extremity arterial disease. The right toe-brachial index  is abnormal. Left: Resting left ankle-brachial index indicates moderate left lower extremity arterial disease. The left toe-brachial index is abnormal. *See table(s) above for measurements and observations. Suggest follow up study in 12 months.  Aorta US 11/15/17 Final Interpretation: Abdominal Aorta: Aortoiliac atherosclerosis without focal stenosis. No evidence of an abdominal aortic aneurysm was visualized. The largest aortic measurement is 2.6 cm.  Echo 10/30/17 EF 60-65, mod AS (mean 22, peak 39), mild MR   History of Present Illness:   Henry [name] was last seen in clinic by Henry [name] in 12/21.  He was admitted 1/7-1/13 with decompensated HF.  He was diuresed with IV furosemide and had a slight bump in his SCr (3.29 >> 3.88). He had elevated hs-Trop levels felt to be c/w demand ischemia.  His Amlodipine was DC'd and he was placed on Hydralazine/Nitrates in order to place him on GDMT.  He needs a f/u BMET arranged.  He is seen for f/u.  He is currently staying at [name] for rehab.  His daughter is on the call with this.  Since discharge from the hospital, he has felt much better.  His breathing is much better.  He has not had orthopnea, PND or leg edema.  He has not had chest pain or syncope.  He was somewhat lightheaded but this is getting better.  He had some concerns about being on hydralazine.  He notes that this was changed to amlodipine in the past due to dizziness.   Past Medical History:  Diagnosis Date  . Anemia   . CAD of autologous bypass graft   . Carotid artery disease ([place])   . Diabetes mellitus without complication ([place])   . DJD (degenerative joint disease)   . History of anemia of chronic disease   . Hypercholesteremia   . Hypertension   . NSTEMI (non-ST elevated myocardial infarction) ([place])   . PVD (peripheral vascular disease) with claudication ([place])   . Renal insufficiency   . Wears dentures   . Wears glasses    Past Surgical History:  Procedure  Laterality Date  . A/V FISTULAGRAM N/A 07/15/2018   Procedure: A/V FISTULAGRAM - Left Arm;  Surgeon: Serafina Mitchell, MD;  Location: [place] [facility];  Service: Cardiovascular;  Laterality: N/A;  . AV FISTULA PLACEMENT Left 03/20/2018   Procedure: ARTERIOVENOUS (AV) FISTULA CREATION LEFT ARM;  Surgeon: Serafina Mitchell, MD;  Location: [facility] OR;  Service: Vascular;  Laterality: Left;  . cataract surgery    . COLONOSCOPY     Hx: of  . CORONARY ARTERY BYPASS GRAFT  2000   by Dr. Cyndia Bent  . LEFT HEART CATH AND CORS/GRAFTS ANGIOGRAPHY N/A 03/27/2018   Procedure: LEFT HEART CATH AND CORS/GRAFTS ANGIOGRAPHY;  Surgeon: Henry Mocha, MD;  Location: [place] [facility];  Service: Cardiovascular;  Laterality: N/A;  . LUMBAR LAMINECTOMY/DECOMPRESSION MICRODISCECTOMY N/A 09/10/2012   Procedure: LUMBAR DECOMPRESSION,  IN SITU FUSION LUMBAR 4-5;  Surgeon: Melina Schools, MD;  Location: [place];  Service: Orthopedics;  Laterality: N/A;  . MULTIPLE TOOTH EXTRACTIONS    . TONSILLECTOMY    . UPPER EXTREMITY VENOGRAPHY Left  07/15/2018   Procedure: UPPER EXTREMITY VENOGRAPHY;  Surgeon: Serafina Mitchell, MD;  Location: [place] [facility];  Service: Cardiovascular;  Laterality: Left;  UPPER ARM     Current Meds  Medication Sig  . aspirin EC 81 MG tablet Take 81 mg by mouth daily.   Marland Kitchen atorvastatin (LIPITOR) 40 MG tablet Take 1 tablet (40 mg total) by mouth every evening.  Marland Kitchen b complex-vitamin c-folic acid (NEPHRO-VITE) 0.8 MG TABS tablet Take 1 tablet by mouth at bedtime.  . calcitRIOL (ROCALTROL) 0.25 MCG capsule Take 0.25 mcg by mouth once a week. Monday  . calcium acetate (PHOSLO) 667 MG capsule Take 667 mg by mouth 2 (two) times daily with a meal.  . Darbepoetin Alfa (ARANESP) 100 MCG/0.5ML SOSY injection Inject 100 mcg into the skin every 30 (thirty) days.   . diphenoxylate-atropine (LOMOTIL) 2.5-0.025 MG tablet Take 1 tablet by mouth 4 (four) times daily as needed for diarrhea or loose stools.  . ferrous  gluconate (FERGON) 324 MG tablet Take 324 mg by mouth daily.  . furosemide (LASIX) 20 MG tablet Take 1 tablet by mouth once a day except Tuesday, take 2 tablets by mouth  . hydrALAZINE (APRESOLINE) 10 MG tablet Take 1 tablet (10 mg total) by mouth every 8 (eight) hours.  . isosorbide mononitrate (IMDUR) 30 MG 24 hr tablet Take 1 tablet (30 mg total) by mouth daily.  . Lancets (ACCU-CHEK MULTICLIX) lancets USE TWICE DAILY.  Marland Kitchen levothyroxine (SYNTHROID) 50 MCG tablet Take 50 mcg by mouth daily before breakfast.  . Multiple Vitamins-Minerals (PRESERVISION AREDS 2 PO) Take 1 tablet by mouth daily.  . nitroGLYCERIN (NITROSTAT) 0.4 MG SL tablet Place 1 tablet (0.4 mg total) under the tongue every 5 (five) minutes as needed for chest pain.  Marland Kitchen Propylene Glycol (SYSTANE BALANCE) 0.6 % SOLN Place 1 drop into both eyes daily as needed (dry eyes).  . [DISCONTINUED] furosemide (LASIX) 20 MG tablet Take 20 mg by mouth daily.    Allergies:   Patient has no known allergies.   Social History   Tobacco Use  . Smoking status: Former Smoker    Packs/day: 1.00    Years: 10.00    Pack years: 10.00    Types: Cigarettes    Quit date: 02/26/1953    Years since quitting: 67.0  . Smokeless tobacco: Never Used  Vaping Use  . Vaping Use: Never used  Substance Use Topics  . Alcohol use: Not Currently    Alcohol/week: 0.0 standard drinks  . Drug use: No     Family Hx: The patient's family history includes Heart disease in his father and mother.  ROS:   Please see the history of present illness.      Labs/Other Tests and Data Reviewed:    EKG:  No ECG reviewed.  Recent Labs: 11/09/2019: Magnesium 2.1 03/04/2020: ALT 15; B Natriuretic Peptide 808.0; TSH 6.507 03/09/2020: BUN 75; Creatinine, Ser 3.76; Hemoglobin 8.5; Platelets 284; Potassium 4.2; Sodium 135   Recent Lipid Panel Lab Results  Component Value Date/Time   CHOL 158 08/13/2019 12:03 PM   CHOL 122 07/09/2018 03:48 PM   TRIG 113.0 08/13/2019  12:03 PM   HDL 53.60 08/13/2019 12:03 PM   HDL 47 07/09/2018 03:48 PM   CHOLHDL 3 08/13/2019 12:03 PM   LDLCALC 82 08/13/2019 12:03 PM   LDLCALC 58 07/09/2018 03:48 PM   LDLDIRECT 96.2 04/04/2007 10:49 AM    Wt Readings from Last 3 Encounters:  03/14/20 138 lb (62.6 kg)  03/09/20 131 lb 2.8 oz (59.5 kg)  02/10/20 146 lb (66.2 kg)     Risk Assessment/Calculations:      Objective:    Vital Signs:  BP 121/62   Pulse 83   Ht 5\' 9"  (1.753 m)   Wt 138 lb (62.6 kg)   SpO2 95%   BMI 20.38 kg/m    VITAL SIGNS:  reviewed GEN:  no acute distress EYES:  Sclera nonicteric RESPIRATORY:  Normal respiratory effort PSYCH:  normal affect  ASSESSMENT & PLAN:    1. HFrEF (heart failure with reduced ejection fraction) (HCC) EF 30-35.  Nonischemic cardiomyopathy.  NYHA IIb.  His volume status seems to be stable since DC.  The nurse was able to weigh him while we were on the video call.  His weight today is 134.8.  He was 131 when he left the hospital.  He has been on Lasix 20 mg daily since discharge.  The notes from Dr. Stanford Breed in the hospital indicated he would be on 40 mg daily.  He was sent home on 40 mg every other day.  [place] change his Lasix to 20 mg daily.  He seems to be tolerating hydralazine and isosorbide well.  If his dizziness continues, we may need to decrease the dose of isosorbide.  There was a mention of whether or not to place him on a beta-blocker if his heart rate can tolerate.  This has been stopped in the past due to second-degree block AV block.  We can certainly get an EKG the next time he comes into the office to decide if we should try beta-blocker therapy again.  For now, I would hold off.  -Increase furosemide to 40 mg once weekly; continue 20 mg daily all other days  -BMET this week and again next week  -Continue current dose of hydralazine, isosorbide  -Follow-up with me or Henry [name] in 2 weeks via video visit  2. Severe aortic stenosis He has low-flow  low gradient severe aortic stenosis.  He is not a candidate for TAVR.  He is being managed conservatively.  3. CKD (chronic kidney disease) stage 5, GFR less than 15 ml/min (HCC) We will keep a close eye on his renal function as outlined above with follow-up BMET's.  4. Coronary artery disease involving native coronary artery of native heart without angina pectoris History of CABG in 2000.  He had a non-STEMI in 02/2018 with 2/4 grafts patent.  He has been managed medically.  He is currently not having anginal symptoms.  Continue aspirin, statin.  5. Essential hypertension The patient's blood pressure is controlled on his current regimen.  Continue current therapy.      Time:   Today, I have spent 17 minutes with the patient with telehealth technology discussing the above problems.     Medication Adjustments/Labs and Tests Ordered: Current medicines are reviewed at length with the patient today.  Concerns regarding medicines are outlined above.   Tests Ordered: No orders of the defined types were placed in this encounter.   Medication Changes: Meds ordered this encounter  Medications  . furosemide (LASIX) 20 MG tablet    Sig: Take 1 tablet by mouth once a day except Tuesday, take 2 tablets by mouth    Dispense:  94 tablet    Refill:  3    Follow Up:  Virtual Visit  in 2 week(s)  Signed, [name], PA-C  03/14/2020 1:44 PM    [facility]

## 2020-03-14 ENCOUNTER — Encounter: Payer: Self-pay | Admitting: Physician Assistant

## 2020-03-14 ENCOUNTER — Telehealth (INDEPENDENT_AMBULATORY_CARE_PROVIDER_SITE_OTHER): Payer: Medicare Other | Admitting: Physician Assistant

## 2020-03-14 ENCOUNTER — Telehealth: Payer: Self-pay | Admitting: *Deleted

## 2020-03-14 ENCOUNTER — Other Ambulatory Visit: Payer: Self-pay

## 2020-03-14 VITALS — BP 121/62 | HR 83 | Ht 69.0 in | Wt 138.0 lb

## 2020-03-14 DIAGNOSIS — I10 Essential (primary) hypertension: Secondary | ICD-10-CM | POA: Diagnosis not present

## 2020-03-14 DIAGNOSIS — I350 Nonrheumatic aortic (valve) stenosis: Secondary | ICD-10-CM | POA: Diagnosis not present

## 2020-03-14 DIAGNOSIS — I2510 Atherosclerotic heart disease of native coronary artery without angina pectoris: Secondary | ICD-10-CM

## 2020-03-14 DIAGNOSIS — N185 Chronic kidney disease, stage 5: Secondary | ICD-10-CM

## 2020-03-14 DIAGNOSIS — I5020 Unspecified systolic (congestive) heart failure: Secondary | ICD-10-CM

## 2020-03-14 MED ORDER — FUROSEMIDE 20 MG PO TABS: ORAL_TABLET | ORAL | 3 refills | Status: DC

## 2020-03-14 NOTE — Telephone Encounter (Signed)
        Patient Consent for Virtual Visit         Henry [name] has provided verbal consent on 03/14/2020 for a virtual visit (video or telephone).   CONSENT FOR VIRTUAL VISIT FOR:  Henry [name]  By participating in this virtual visit I agree to the following:  I hereby voluntarily request, consent and authorize [facility] and its employed or contracted physicians, physician assistants, nurse practitioners or other licensed health care professionals (the Practitioner), to provide me with telemedicine health care services (the "Services") as deemed necessary by the treating Practitioner. I acknowledge and consent to receive the Services by the Practitioner via telemedicine. I understand that the telemedicine visit will involve communicating with the Practitioner through live audiovisual communication technology and the disclosure of certain medical information by electronic transmission. I acknowledge that I have been given the opportunity to request an in-person assessment or other available alternative prior to the telemedicine visit and am voluntarily participating in the telemedicine visit.  I understand that I have the right to withhold or withdraw my consent to the use of telemedicine in the course of my care at any time, without affecting my right to future care or treatment, and that the Practitioner or I may terminate the telemedicine visit at any time. I understand that I have the right to inspect all information obtained and/or recorded in the course of the telemedicine visit and may receive copies of available information for a reasonable fee.  I understand that some of the potential risks of receiving the Services via telemedicine include:  Marland Kitchen Delay or interruption in medical evaluation due to technological equipment failure or disruption; . Information transmitted may not be sufficient (e.g. poor resolution of images) to allow for appropriate medical decision making by the  Practitioner; and/or  . In rare instances, security protocols could fail, causing a breach of personal health information.  Furthermore, I acknowledge that it is my responsibility to provide information about my medical history, conditions and care that is complete and accurate to the best of my ability. I acknowledge that Practitioner's advice, recommendations, and/or decision may be based on factors not within their control, such as incomplete or inaccurate data provided by me or distortions of diagnostic images or specimens that may result from electronic transmissions. I understand that the practice of medicine is not an exact science and that Practitioner makes no warranties or guarantees regarding treatment outcomes. I acknowledge that a copy of this consent can be made available to me via my patient portal ([facility]), or I can request a printed copy by calling the office of [facility].    I understand that my insurance will be billed for this visit.   I have read or had this consent read to me. . I understand the contents of this consent, which adequately explains the benefits and risks of the Services being provided via telemedicine.  . I have been provided ample opportunity to ask questions regarding this consent and the Services and have had my questions answered to my satisfaction. . I give my informed consent for the services to be provided through the use of telemedicine in my medical care

## 2020-03-14 NOTE — Patient Instructions (Signed)
Medication Instructions:  Your physician has recommended you make the following change in your medication:   1) Increase Furosemide to 2 tablets (40 mg) on Tuesdays and take 1 tablet by mouth all other days  *If you need a refill on your cardiac medications before your next appointment, please call your pharmacy*  Lab Work: Your physician recommends that you return for lab work in:   Testing/Procedures: None ordered today  Follow-Up: At [facility], you and your health needs are our priority.  As part of our continuing mission to provide you with exceptional heart care, we have created designated Provider Care Teams.  These Care Teams include your primary Cardiologist (physician) and Advanced Practice Providers (APPs -  Physician Assistants and Nurse Practitioners) who all work together to provide you with the care you need, when you need it.  Your next appointment:   2 week(s)  The format for your next appointment:   In Person  Provider:   [name], PA-C

## 2020-03-16 DIAGNOSIS — I248 Other forms of acute ischemic heart disease: Secondary | ICD-10-CM | POA: Diagnosis not present

## 2020-03-16 DIAGNOSIS — N184 Chronic kidney disease, stage 4 (severe): Secondary | ICD-10-CM | POA: Diagnosis not present

## 2020-03-16 DIAGNOSIS — R42 Dizziness and giddiness: Secondary | ICD-10-CM | POA: Diagnosis not present

## 2020-03-16 DIAGNOSIS — I150 Renovascular hypertension: Secondary | ICD-10-CM | POA: Diagnosis not present

## 2020-03-16 DIAGNOSIS — I5041 Acute combined systolic (congestive) and diastolic (congestive) heart failure: Secondary | ICD-10-CM | POA: Diagnosis not present

## 2020-03-16 DIAGNOSIS — I2510 Atherosclerotic heart disease of native coronary artery without angina pectoris: Secondary | ICD-10-CM | POA: Diagnosis not present

## 2020-03-16 DIAGNOSIS — D638 Anemia in other chronic diseases classified elsewhere: Secondary | ICD-10-CM | POA: Diagnosis not present

## 2020-03-16 DIAGNOSIS — E119 Type 2 diabetes mellitus without complications: Secondary | ICD-10-CM | POA: Diagnosis not present

## 2020-03-16 DIAGNOSIS — E039 Hypothyroidism, unspecified: Secondary | ICD-10-CM | POA: Diagnosis not present

## 2020-03-16 DIAGNOSIS — R5381 Other malaise: Secondary | ICD-10-CM | POA: Diagnosis not present

## 2020-03-17 DIAGNOSIS — I509 Heart failure, unspecified: Secondary | ICD-10-CM | POA: Diagnosis not present

## 2020-03-17 DIAGNOSIS — I2510 Atherosclerotic heart disease of native coronary artery without angina pectoris: Secondary | ICD-10-CM | POA: Diagnosis not present

## 2020-03-17 DIAGNOSIS — E782 Mixed hyperlipidemia: Secondary | ICD-10-CM | POA: Diagnosis not present

## 2020-03-17 DIAGNOSIS — E039 Hypothyroidism, unspecified: Secondary | ICD-10-CM | POA: Diagnosis not present

## 2020-03-17 DIAGNOSIS — M6281 Muscle weakness (generalized): Secondary | ICD-10-CM | POA: Diagnosis not present

## 2020-03-17 DIAGNOSIS — I10 Essential (primary) hypertension: Secondary | ICD-10-CM | POA: Diagnosis not present

## 2020-03-17 DIAGNOSIS — D649 Anemia, unspecified: Secondary | ICD-10-CM | POA: Diagnosis not present

## 2020-03-17 DIAGNOSIS — E119 Type 2 diabetes mellitus without complications: Secondary | ICD-10-CM | POA: Diagnosis not present

## 2020-03-17 DIAGNOSIS — R2681 Unsteadiness on feet: Secondary | ICD-10-CM | POA: Diagnosis not present

## 2020-03-17 DIAGNOSIS — I359 Nonrheumatic aortic valve disorder, unspecified: Secondary | ICD-10-CM | POA: Diagnosis not present

## 2020-03-17 DIAGNOSIS — N184 Chronic kidney disease, stage 4 (severe): Secondary | ICD-10-CM | POA: Diagnosis not present

## 2020-03-17 DIAGNOSIS — I739 Peripheral vascular disease, unspecified: Secondary | ICD-10-CM | POA: Diagnosis not present

## 2020-03-22 DIAGNOSIS — N184 Chronic kidney disease, stage 4 (severe): Secondary | ICD-10-CM | POA: Diagnosis not present

## 2020-03-22 DIAGNOSIS — I248 Other forms of acute ischemic heart disease: Secondary | ICD-10-CM | POA: Diagnosis not present

## 2020-03-22 DIAGNOSIS — I252 Old myocardial infarction: Secondary | ICD-10-CM | POA: Diagnosis not present

## 2020-03-22 DIAGNOSIS — I5043 Acute on chronic combined systolic (congestive) and diastolic (congestive) heart failure: Secondary | ICD-10-CM | POA: Diagnosis not present

## 2020-03-22 DIAGNOSIS — I5022 Chronic systolic (congestive) heart failure: Secondary | ICD-10-CM | POA: Diagnosis not present

## 2020-03-22 DIAGNOSIS — R809 Proteinuria, unspecified: Secondary | ICD-10-CM | POA: Diagnosis not present

## 2020-03-22 DIAGNOSIS — I2510 Atherosclerotic heart disease of native coronary artery without angina pectoris: Secondary | ICD-10-CM | POA: Diagnosis not present

## 2020-03-22 DIAGNOSIS — E1122 Type 2 diabetes mellitus with diabetic chronic kidney disease: Secondary | ICD-10-CM | POA: Diagnosis not present

## 2020-03-22 DIAGNOSIS — N2581 Secondary hyperparathyroidism of renal origin: Secondary | ICD-10-CM | POA: Diagnosis not present

## 2020-03-22 DIAGNOSIS — I150 Renovascular hypertension: Secondary | ICD-10-CM | POA: Diagnosis not present

## 2020-03-22 DIAGNOSIS — I739 Peripheral vascular disease, unspecified: Secondary | ICD-10-CM | POA: Diagnosis not present

## 2020-03-22 DIAGNOSIS — N179 Acute kidney failure, unspecified: Secondary | ICD-10-CM | POA: Diagnosis not present

## 2020-03-22 DIAGNOSIS — M1990 Unspecified osteoarthritis, unspecified site: Secondary | ICD-10-CM | POA: Diagnosis not present

## 2020-03-22 DIAGNOSIS — I350 Nonrheumatic aortic (valve) stenosis: Secondary | ICD-10-CM | POA: Diagnosis not present

## 2020-03-22 DIAGNOSIS — Z9981 Dependence on supplemental oxygen: Secondary | ICD-10-CM | POA: Diagnosis not present

## 2020-03-22 DIAGNOSIS — Z87891 Personal history of nicotine dependence: Secondary | ICD-10-CM | POA: Diagnosis not present

## 2020-03-22 DIAGNOSIS — I129 Hypertensive chronic kidney disease with stage 1 through stage 4 chronic kidney disease, or unspecified chronic kidney disease: Secondary | ICD-10-CM | POA: Diagnosis not present

## 2020-03-22 DIAGNOSIS — Z9181 History of falling: Secondary | ICD-10-CM | POA: Diagnosis not present

## 2020-03-22 DIAGNOSIS — Z7982 Long term (current) use of aspirin: Secondary | ICD-10-CM | POA: Diagnosis not present

## 2020-03-22 DIAGNOSIS — D631 Anemia in chronic kidney disease: Secondary | ICD-10-CM | POA: Diagnosis not present

## 2020-03-22 DIAGNOSIS — E785 Hyperlipidemia, unspecified: Secondary | ICD-10-CM | POA: Diagnosis not present

## 2020-03-22 DIAGNOSIS — Z951 Presence of aortocoronary bypass graft: Secondary | ICD-10-CM | POA: Diagnosis not present

## 2020-03-22 DIAGNOSIS — E039 Hypothyroidism, unspecified: Secondary | ICD-10-CM | POA: Diagnosis not present

## 2020-03-22 DIAGNOSIS — E1151 Type 2 diabetes mellitus with diabetic peripheral angiopathy without gangrene: Secondary | ICD-10-CM | POA: Diagnosis not present

## 2020-03-22 DIAGNOSIS — I770 Arteriovenous fistula, acquired: Secondary | ICD-10-CM | POA: Diagnosis not present

## 2020-03-23 ENCOUNTER — Telehealth: Payer: Self-pay

## 2020-03-23 ENCOUNTER — Telehealth: Payer: Self-pay | Admitting: Internal Medicine

## 2020-03-23 NOTE — Telephone Encounter (Signed)
Verbal orders given  

## 2020-03-23 NOTE — Telephone Encounter (Signed)
   [place] Name: [facility] Agency Name: [facility] Phone #: [phone] Service Requested: PT (examples: OT/PT/Skilled Nursing/Social Work/Speech Therapy/Wound Care) Frequency of Visits: 2W3, 1W6   Clair Gulling also requesting clarification of med list Referral to medical social worker Refer for speech; swallowing Also, requesting to clarify of patient has a diabetes diagnosis  Phone [phone] Fax [phone]

## 2020-03-23 NOTE — Telephone Encounter (Signed)
Ok for verbals 

## 2020-03-24 ENCOUNTER — Telehealth: Payer: Self-pay | Admitting: Internal Medicine

## 2020-03-24 ENCOUNTER — Telehealth: Payer: Self-pay

## 2020-03-24 ENCOUNTER — Telehealth: Payer: Self-pay | Admitting: Cardiovascular Disease

## 2020-03-24 ENCOUNTER — Other Ambulatory Visit: Payer: Self-pay | Admitting: *Deleted

## 2020-03-24 DIAGNOSIS — I248 Other forms of acute ischemic heart disease: Secondary | ICD-10-CM | POA: Diagnosis not present

## 2020-03-24 DIAGNOSIS — I150 Renovascular hypertension: Secondary | ICD-10-CM | POA: Diagnosis not present

## 2020-03-24 DIAGNOSIS — E1122 Type 2 diabetes mellitus with diabetic chronic kidney disease: Secondary | ICD-10-CM | POA: Diagnosis not present

## 2020-03-24 DIAGNOSIS — D631 Anemia in chronic kidney disease: Secondary | ICD-10-CM | POA: Diagnosis not present

## 2020-03-24 DIAGNOSIS — I10 Essential (primary) hypertension: Secondary | ICD-10-CM

## 2020-03-24 DIAGNOSIS — N184 Chronic kidney disease, stage 4 (severe): Secondary | ICD-10-CM | POA: Diagnosis not present

## 2020-03-24 DIAGNOSIS — I5043 Acute on chronic combined systolic (congestive) and diastolic (congestive) heart failure: Secondary | ICD-10-CM | POA: Diagnosis not present

## 2020-03-24 NOTE — Telephone Encounter (Signed)
New Message:   Henry [name] from [facility] wants to give an update on pt, while he is at the pt's home.

## 2020-03-24 NOTE — Telephone Encounter (Signed)
Clair Gulling a physical therapist with [facility] calling requesting verbal orders to add a nursing evaluation for [facility]. Patient reports a fair appeitite but the family is worried the patient is losing weight.  Clair Gulling- [phone] Ok to lvm

## 2020-03-24 NOTE — Telephone Encounter (Signed)
Spoke with patient. He requested I call his daughter to schedule appointment. Attempted to contact patient's daughter [name] to schedule a follow up appointment. No answer left a message to return call.

## 2020-03-24 NOTE — Patient Outreach (Signed)
Member screened for potential [facility] Care Management needs.  Verified in [place] (Patient Henry [name]) that member transitioned home to home 03/17/20 from [facility].   Telephone call to Mr. Kreitzer [phone]. No answer and voicemail did not pick up. Telephone call made to daughter/DPR Nathen May [phone]. Discussed that writer is attempting to contact member about [facility] services. [name] reports that Mr. Tamala Bari is probably on the telephone. [name] reports her mother, member's wife recently passed away. [name] asks that Probation officer contact her sister [name] [facility] to discuss [facility] services.   Telephone call made to [place] [phone]. Stanton Kidney is a Marine scientist and also states she is Cabin crew. Stanton Kidney reports she is concerned that member has been losing weight and she needs to find out if member needs to take his lasix or not. Daneil Dan to contact Dr. Gwynn Burly office. Mr. Weinkauf has [facility].   Briefly discussed [facility] services for home visits due to member's high risk for readmission. [name] states this will be a great idea. [name] requests that she be contacted initially for home visits and thereafter member can be contacted. Stanton Kidney states she is responsible for making medical decisions for Mr. Rehfeldt.   Will email [facility] writer's contact information. Referral sent to [facility].    Marthenia Rolling, MSN, RN,BSN [place] Acute Care Coordinator [phone] [facility]) [phone]  (Toll free office)

## 2020-03-24 NOTE — Telephone Encounter (Signed)
Spoke with Clair Gulling, PT with [facility] and pt who report pt is having some intermittent lightheadedness or mild dizziness especially with movement.  Pt reports this clears within a few seconds to a couple of minutes.  PT does not feel this is orthostatic as pt's BP has remained stable. Today's BP 140/75. Pt denies current CP, SOB, fainting, nausea vomiting or fever.  He does report a decrease in appetite and is eating 2 meals a day.  Pt is also on fluid restrictions d/t CHF.  He has a hx of diabetes and does not check his BS regularly but states he will begin checking on a more regular basis.  Pt reports he is taking medications as prescribed.   Looking at pt's last BMET on 03/09/2020 pt has a Hgb of 8.5 and suspect this could be contributing to his dizziness and lightheadedness as well as possible low BS d/t decreased food intake.   Will forward information to Dr Burt Knack and [name], PA-C for review and recommendation as pt is scheduled for virtual visit on 03/28/2020.  Pt advised to also contact his PCP to report current symptoms.  Reviewed ED precautions.  Continue current medications as prescribed.

## 2020-03-24 NOTE — Telephone Encounter (Signed)
Ok for verbals 

## 2020-03-25 ENCOUNTER — Other Ambulatory Visit: Payer: Self-pay

## 2020-03-25 ENCOUNTER — Other Ambulatory Visit: Payer: Medicare Other | Admitting: *Deleted

## 2020-03-25 ENCOUNTER — Telehealth: Payer: Self-pay

## 2020-03-25 ENCOUNTER — Other Ambulatory Visit: Payer: Self-pay | Admitting: Internal Medicine

## 2020-03-25 DIAGNOSIS — I10 Essential (primary) hypertension: Secondary | ICD-10-CM | POA: Diagnosis not present

## 2020-03-25 NOTE — Telephone Encounter (Signed)
I think it's best to have Scott review when he sees him in [facility] 1/31. thanks

## 2020-03-25 NOTE — Telephone Encounter (Signed)
SW, PA requesting oxygen evaluation since patient family is requesting home oxygen.   Certificate of Medical Necessity form found in patient chart-signed by PCP Dr. Cathlean Cower dated 11/11/2019. See Media tab for scanned copy of form.   Called and spoke with [place] who was listed as supplier on medical necessity form.  Representative states that home oxygen was delivered to patient on 11/13/19 and was picked up per patient request on 11/15/19, reason being patient stated he no longer needed home oxygen.  Representative states new order will need to be written due to patient requesting oxygen pickup back in September.   Called and spoke with patients son who states patient was not willing to wear home oxygen at this time but has since declined and been hospitalized multiple times where he has worn oxygen and is now agreeable to wearing home oxygen because he knows it makes him feel better.  Advised patients son to follow up with PCP Dr. Jenny Reichmann who originally evaluated patient for oxygen needs.  Patients son agreeable.

## 2020-03-25 NOTE — Telephone Encounter (Signed)
Spoke with the patient and his son. SW, PA requesting BMET and CBC before 1/31 OV on Monday.  Patients son agreeable to bring him in this afternoon. Orders placed and lab appt made.   Patients son would also like to make note that he and patients daughter have been trying to get home oxygen orders for a while now as they think patient could benefit from PRN oxygen at home. Informed patients son that I would make Scott aware and it could be discussed at patients OV on Monday.

## 2020-03-25 NOTE — Telephone Encounter (Signed)
If he is able to get them done today - I would get a BMET, CBC. Thanks [employer], [place]    03/25/2020 1:37 PM

## 2020-03-25 NOTE — Telephone Encounter (Signed)
He would need an eval for O2.  See if [facility] can do that for [place].  He needs to have his O2 checked at rest, with ambulation and then with O2 (if needed) at rest and ambulation.  This needs to be done to get Medicare to approve home O2. [name], PA-C    03/25/2020 1:51 PM

## 2020-03-25 NOTE — Telephone Encounter (Signed)
Same answer as per [name], PA - thanks

## 2020-03-25 NOTE — Telephone Encounter (Signed)
Spoke with patient and patients son. Dr. Jenny Reichmann agree with [name], PA. So they both were told that pt needs a office visit for 02 evaluation. Patient sons declined appt and stated that palliative care will be taking care of this matter in February .

## 2020-03-25 NOTE — Telephone Encounter (Signed)
Is home health able to draw labs? I had requested a f/u BMET when I saw him last.  Can we see if those labs were done?  Make sure they are scanned into his chart so I can review before his visit on 1/31. [name], PA-C    03/25/2020 1:25 PM

## 2020-03-25 NOTE — Telephone Encounter (Signed)
Verbals left on [employer]

## 2020-03-26 ENCOUNTER — Other Ambulatory Visit: Payer: Self-pay | Admitting: Internal Medicine

## 2020-03-26 LAB — BASIC METABOLIC PANEL
BUN/Creatinine Ratio: 19 (ref 10–24)
BUN: 56 mg/dL — ABNORMAL HIGH (ref 10–36)
CO2: 21 mmol/L (ref 20–29)
Calcium: 9 mg/dL (ref 8.6–10.2)
Chloride: 99 mmol/L (ref 96–106)
Creatinine, Ser: 2.89 mg/dL — ABNORMAL HIGH (ref 0.76–1.27)
GFR calc Af Amer: 21 mL/min/{1.73_m2} — ABNORMAL LOW (ref >59)
GFR calc non Af Amer: 18 mL/min/{1.73_m2} — ABNORMAL LOW (ref >59)
Glucose: 123 mg/dL — ABNORMAL HIGH (ref 65–99)
Potassium: 4.2 mmol/L (ref 3.5–5.2)
Sodium: 135 mmol/L (ref 134–144)

## 2020-03-26 LAB — CBC
Hematocrit: 29.3 % — ABNORMAL LOW (ref 37.5–51.0)
Hemoglobin: 9.6 g/dL — ABNORMAL LOW (ref 13.0–17.7)
MCH: 28.9 pg (ref 26.6–33.0)
MCHC: 32.8 g/dL (ref 31.5–35.7)
MCV: 88 fL (ref 79–97)
Platelets: 335 10*3/uL (ref 150–450)
RBC: 3.32 x10E6/uL — ABNORMAL LOW (ref 4.14–5.80)
RDW: 14.5 % (ref 11.6–15.4)
WBC: 8.8 10*3/uL (ref 3.4–10.8)

## 2020-03-27 NOTE — Progress Notes (Signed)
Virtual Visit via Video Note   This visit type was conducted due to national recommendations for restrictions regarding the COVID-19 Pandemic (e.g. social distancing) in an effort to limit this patient's exposure and mitigate transmission in our community.  Due to his co-morbid illnesses, this patient is at least at moderate risk for complications without adequate follow up.  This format is felt to be most appropriate for this patient at this time.  All issues noted in this document were discussed and addressed.  A limited physical exam was performed with this format.  Please refer to the patient's chart for his consent to telehealth for [facility].       Date:  03/28/2020   ID:  Henry [name], DOB Jul 01, 1924, MRN [id number] The patient was identified using 2 identifiers.  Patient Location: Home Provider Location: Office/Clinic  PCP:  [name], MD  Cardiologist:  Sherren Mocha, MD   Electrophysiologist:  None   Evaluation Performed:  Follow-Up Visit  Chief Complaint:  Follow-up (CHF)    Patient Profile: Henry [name] is a 87 y.o. male with:  Coronary artery disease  ? S/p CABG in 2000 ? S/p NSTEMI1/2020in setting of acute diastolic CHF + OXB>>3/5 grafts patent (chronic occlusions)>>Med Rx  Aortic stenosis ? Severe (Echocardiogram 9/21): mean 18 mmHg, DI 0.26 - low flow low gradient) ? Echocardiogram 1/22: Mean 17 mmHg, DI 0.24, V-max 268 m/s ? Not a candidate for TAVR ? Risk of progression to ESRD outweighed benefit of treating AS >> palliative care  Heart failure with reduced ejection fraction  ? Echocardiogram 1/22: EF 30-35 ? Echocardiogram 9/21: EF 35-40 ? Echocardiogram 4/21: EF 50-55 ? Echocardiogram 02/2018: EF 50-55  Peripheral arterial disease - med Rx   Chronic kidney disease, stage V ? AVF failed; no dialysis access  ? Dr. 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 During AVF  placement in 02/2018 >>beta-blockerDC'd   Orthostatic intolerance  DNR    Prior CV Studies:  Echocardiogram 03/05/2020  EF 30-35, global HK, G1 DD, normal RVSF, mildly elevated PASP, moderate LAE, mild MR, at least moderate aortic stenosis (cannot exclude low-flow, low gradient aortic stenosis), DI 0.24, V-max 268 m/s, mean AV gradient 17 mmHg  Echocardiogram 11/10/19 EF 35-40, global HK, mild reduced RVSF, normal PASP, mild LAE, mild MR, severe Ca2+ of AV, mod AI, mod AS (mean 18 mmHg, Vmax 287 cm/s, DI 0.26 - gradient underestimated in setting of LV dysfn - at least mod to severe low flow low gradient AS), Ao root 37 mm, asc Ao 38 mm  Echocardiogram 06/18/19 Inf/inf-lat HK, EF 50-55, mod LVH, Gr 2 DD, normal RVSF, mod LAE, mild MR, mild AI, mod AS (mean 25 mmHg), RVSP 45 mmHg  Cardiac catheterization 03/27/2018 LAD proximal 100 OM1 ostial 90 RCA ostial 100 (left to right collaterals) SVG-RPDA 100 SVG-OM1 100 SVG-D1 patent SVG-LAD patent 1. Severe native 3 vessel disease with total occlusion of the RCA and LAD, severe stenosis of the left circumflex 2. S/P remote CABG with continued patency of the LIMA-LAD and SVG-diagonal, total occlusion of the SVG-RCA and SVG-PDA Recommend medical management of CAD. No clear culprit lesion. Both SVG occlusions appear chronic. No targets for PCI.  ABI/Arterial US 11/15/17 Final Interpretation: Right: Resting right ankle-brachial index indicates mild right lower extremity arterial disease. The right toe-brachial index is abnormal. Left: Resting left ankle-brachial index indicates moderate left lower extremity arterial disease. The  left toe-brachial index is abnormal. *See table(s) above for measurements and observations. Suggest follow up study in 12 months.  Aorta US 11/15/17 No AAA - Ao measured 2.6 cm.   History of Present Illness:   Henry [name] was last seen by me via Telemedicine 03/15/19 for post hospital f/u.  He had been admitted  with decompensated HF.  He was staying at [facility] after DC and was feeling better since DC.  His weight was up a little and I adjusted his furosemide slightly.  Since his last visit, he called in with symptoms of dizziness noted by his PT.  His BP was noted to be stable.  F/u labs indicated stable Hgb and Creatinine.  He is seen for close f/u.  He continues to have issues with dizziness.  He describes as a lightheadedness.  It is fairly constant.  He has not had syncope.  He has not had chest pain.  His breathing remains improved.  He sleeps on 2 pillows.  He has not had any development of leg edema.   Past Medical History:  Diagnosis Date  . Anemia   . CAD of autologous bypass graft   . Carotid artery disease ([place])   . Diabetes mellitus without complication ([place])   . DJD (degenerative joint disease)   . History of anemia of chronic disease   . Hypercholesteremia   . Hypertension   . NSTEMI (non-ST elevated myocardial infarction) ([place])   . PVD (peripheral vascular disease) with claudication ([place])   . Renal insufficiency   . Wears dentures   . Wears glasses    Past Surgical History:  Procedure Laterality Date  . A/V FISTULAGRAM N/A 07/15/2018   Procedure: A/V FISTULAGRAM - Left Arm;  Surgeon: Serafina Mitchell, MD;  Location: [place] [facility];  Service: Cardiovascular;  Laterality: N/A;  . AV FISTULA PLACEMENT Left 03/20/2018   Procedure: ARTERIOVENOUS (AV) FISTULA CREATION LEFT ARM;  Surgeon: Serafina Mitchell, MD;  Location: [facility] OR;  Service: Vascular;  Laterality: Left;  . cataract surgery    . COLONOSCOPY     Hx: of  . CORONARY ARTERY BYPASS GRAFT  2000   by Dr. Cyndia Bent  . LEFT HEART CATH AND CORS/GRAFTS ANGIOGRAPHY N/A 03/27/2018   Procedure: LEFT HEART CATH AND CORS/GRAFTS ANGIOGRAPHY;  Surgeon: Sherren Mocha, MD;  Location: [place] [facility];  Service: Cardiovascular;  Laterality: N/A;  . LUMBAR LAMINECTOMY/DECOMPRESSION MICRODISCECTOMY N/A 09/10/2012   Procedure: LUMBAR  DECOMPRESSION,  IN SITU FUSION LUMBAR 4-5;  Surgeon: Melina Schools, MD;  Location: [place];  Service: Orthopedics;  Laterality: N/A;  . MULTIPLE TOOTH EXTRACTIONS    . TONSILLECTOMY    . UPPER EXTREMITY VENOGRAPHY Left 07/15/2018   Procedure: UPPER EXTREMITY VENOGRAPHY;  Surgeon: Serafina Mitchell, MD;  Location: [place] [facility];  Service: Cardiovascular;  Laterality: Left;  UPPER ARM     Current Meds  Medication Sig  . aspirin EC 81 MG tablet Take 81 mg by mouth daily.   Marland Kitchen atorvastatin (LIPITOR) 40 MG tablet Take 1 tablet (40 mg total) by mouth every evening.  Marland Kitchen b complex-vitamin c-folic acid (NEPHRO-VITE) 0.8 MG TABS tablet Take 1 tablet by mouth at bedtime.  . calcitRIOL (ROCALTROL) 0.25 MCG capsule Take 0.25 mcg by mouth once a week. Monday  . calcium acetate (PHOSLO) 667 MG capsule Take 667 mg by mouth 2 (two) times daily with a meal.  . Darbepoetin Alfa (ARANESP) 100 MCG/0.5ML SOSY injection Inject 100 mcg into the skin  every 30 (thirty) days.   . diphenoxylate-atropine (LOMOTIL) 2.5-0.025 MG tablet Take 1 tablet by mouth 4 (four) times daily as needed for diarrhea or loose stools.  . ferrous gluconate (FERGON) 324 MG tablet Take 324 mg by mouth daily.  . furosemide (LASIX) 20 MG tablet Take 1 tablet (20 mg total) by mouth daily.  . hydrALAZINE (APRESOLINE) 10 MG tablet Take 1 tablet (10 mg total) by mouth every 8 (eight) hours.  . isosorbide mononitrate (IMDUR) 30 MG 24 hr tablet Take 1 tablet (30 mg total) by mouth daily.  . Lancets (ACCU-CHEK MULTICLIX) lancets USE TWICE DAILY.  Marland Kitchen levothyroxine (SYNTHROID) 50 MCG tablet Take 50 mcg by mouth daily before breakfast.  . Multiple Vitamins-Minerals (PRESERVISION AREDS 2 PO) Take 1 tablet by mouth in the morning and at bedtime.  . nitroGLYCERIN (NITROSTAT) 0.4 MG SL tablet Place 1 tablet (0.4 mg total) under the tongue every 5 (five) minutes as needed for chest pain.  Marland Kitchen Propylene Glycol (SYSTANE BALANCE) 0.6 % SOLN Place 1 drop into both  eyes daily as needed (dry eyes).  . [DISCONTINUED] furosemide (LASIX) 20 MG tablet Take 1 tablet by mouth once a day except Tuesday, take 2 tablets by mouth     Allergies:   Patient has no known allergies.   Social History   Tobacco Use  . Smoking status: Former Smoker    Packs/day: 1.00    Years: 10.00    Pack years: 10.00    Types: Cigarettes    Quit date: 02/26/1953    Years since quitting: 67.1  . Smokeless tobacco: Never Used  Vaping Use  . Vaping Use: Never used  Substance Use Topics  . Alcohol use: Not Currently    Alcohol/week: 0.0 standard drinks  . Drug use: No     Family Hx: The patient's family history includes Heart disease in his father and mother.  ROS:   Please see the history of present illness.      Labs/Other Tests and Data Reviewed:    EKG:  No ECG reviewed.  Recent Labs: 11/09/2019: Magnesium 2.1 03/04/2020: ALT 15; B Natriuretic Peptide 808.0; TSH 6.507 03/25/2020: BUN 56; Creatinine, Ser 2.89; Hemoglobin 9.6; Platelets 335; Potassium 4.2; Sodium 135   Recent Lipid Panel Lab Results  Component Value Date/Time   CHOL 158 08/13/2019 12:03 PM   CHOL 122 07/09/2018 03:48 PM   TRIG 113.0 08/13/2019 12:03 PM   HDL 53.60 08/13/2019 12:03 PM   HDL 47 07/09/2018 03:48 PM   CHOLHDL 3 08/13/2019 12:03 PM   LDLCALC 82 08/13/2019 12:03 PM   LDLCALC 58 07/09/2018 03:48 PM   LDLDIRECT 96.2 04/04/2007 10:49 AM    Wt Readings from Last 3 Encounters:  03/28/20 131 lb (59.4 kg)  03/14/20 138 lb (62.6 kg)  03/09/20 131 lb 2.8 oz (59.5 kg)     Risk Assessment/Calculations:      Objective:    Vital Signs:  BP (!) 157/63   Pulse 84   Ht 5\' 9"  (1.753 m)   Wt 131 lb (59.4 kg)   BMI 19.35 kg/m    VITAL SIGNS:  reviewed GEN:  no acute distress EYES:  Sclera anicteric RESPIRATORY:  Normal respiratory effort PSYCH:  normal affect  ASSESSMENT & PLAN:    1. HFrEF (heart failure with reduced ejection fraction) (HCC) EF 30-35.  Nonischemic  cardiomyopathy.  NYHA IIb.  Volume status is improved.  His weight is down 7 pounds since last visit.  He describes lightheadedness.  I  suspect this may be related to diuresis.  Therefore, I have asked him to hold his furosemide tomorrow and resume furosemide at a lower dose of 20 mg daily.  If his lightheadedness does not improve with this, he should contact me.  At that point, we could try to reduce his dose of isosorbide to see if this helps.  Of note, he was previously on amlodipine and was switched to isosorbide and hydralazine during his last admission.  2. CKD (chronic kidney disease) stage 5, GFR less than 15 ml/min (HCC) Recent creatinine improved to 2.9.  3. Anemia of chronic disease Recent hemoglobin improved to 9.6.  4. Severe aortic stenosis Low flow low gradient severe aortic stenosis.  He is not a candidate for TAVR and is being managed conservatively.  5. Essential hypertension Blood pressure somewhat elevated.  Given his current symptoms, I will hold off on adjusting medications further.  If his lightheadedness improves with reducing his dose of furosemide, we can consider increasing his dose of hydralazine.  6.  Lightheadedness As noted, I am cutting back on his furosemide to see if this helps.  If not, he will contact us.  If his lightheadedness continues, I will set him up for a 3-day ZIO monitor to rule out bradycardia arrhythmia given his prior history of second-degree AV block.    Time:   Today, I have spent 12 minutes with the patient with telehealth technology discussing the above problems.     Medication Adjustments/Labs and Tests Ordered: Current medicines are reviewed at length with the patient today.  Concerns regarding medicines are outlined above.   Tests Ordered: No orders of the defined types were placed in this encounter.   Medication Changes: Meds ordered this encounter  Medications  . furosemide (LASIX) 20 MG tablet    Sig: Take 1 tablet (20 mg  total) by mouth daily.    Dispense:  90 tablet    Refill:  3    Follow Up:  In Person Dr. Burt Knack as scheduled  Signed, [name], PA-C  03/28/2020 4:42 PM    [place]

## 2020-03-28 ENCOUNTER — Encounter: Payer: Self-pay | Admitting: Physician Assistant

## 2020-03-28 ENCOUNTER — Telehealth (INDEPENDENT_AMBULATORY_CARE_PROVIDER_SITE_OTHER): Payer: Medicare Other | Admitting: Physician Assistant

## 2020-03-28 ENCOUNTER — Other Ambulatory Visit: Payer: Self-pay

## 2020-03-28 VITALS — BP 157/63 | HR 84 | Ht 69.0 in | Wt 131.0 lb

## 2020-03-28 DIAGNOSIS — N185 Chronic kidney disease, stage 5: Secondary | ICD-10-CM

## 2020-03-28 DIAGNOSIS — I10 Essential (primary) hypertension: Secondary | ICD-10-CM

## 2020-03-28 DIAGNOSIS — I5020 Unspecified systolic (congestive) heart failure: Secondary | ICD-10-CM

## 2020-03-28 DIAGNOSIS — D638 Anemia in other chronic diseases classified elsewhere: Secondary | ICD-10-CM | POA: Diagnosis not present

## 2020-03-28 DIAGNOSIS — I2510 Atherosclerotic heart disease of native coronary artery without angina pectoris: Secondary | ICD-10-CM | POA: Diagnosis not present

## 2020-03-28 DIAGNOSIS — R42 Dizziness and giddiness: Secondary | ICD-10-CM

## 2020-03-28 DIAGNOSIS — I350 Nonrheumatic aortic (valve) stenosis: Secondary | ICD-10-CM

## 2020-03-28 MED ORDER — FUROSEMIDE 20 MG PO TABS: 20.0000 mg | ORAL_TABLET | Freq: Every day | ORAL | 3 refills | Status: DC

## 2020-03-28 NOTE — Patient Instructions (Signed)
Medication Instructions:  1. Hold the furosemide (Lasix) tomorrow 03/29/2020 and then resume it on Wednesday but at 20 mg by mouth daily.  *If you need a refill on your cardiac medications before your next appointment, please call your pharmacy*   Lab Work: None If you have labs (blood work) drawn today and your tests are completely normal, you will receive your results only by: Marland Kitchen MyChart Message (if you have MyChart) OR . A paper copy in the mail If you have any lab test that is abnormal or we need to change your treatment, we will call you to review the results.   Testing/Procedures: None   Follow-Up: At [facility], you and your health needs are our priority.  As part of our continuing mission to provide you with exceptional heart care, we have created designated Provider Care Teams.  These Care Teams include your primary Cardiologist (physician) and Advanced Practice Providers (APPs -  Physician Assistants and Nurse Practitioners) who all work together to provide you with the care you need, when you need it.   Your next appointment:   Keep your 05/04/2020 appointment  The format for your next appointment:   In Person  Provider:   Sherren Mocha, MD   Other Instructions  1.Weigh yourself daily and let us know if your weight increases 3 pounds or more in one day. 2. Let us know if your lightheadedness/dizziness does not improve in the next 3-4 days.

## 2020-03-29 DIAGNOSIS — E1122 Type 2 diabetes mellitus with diabetic chronic kidney disease: Secondary | ICD-10-CM | POA: Diagnosis not present

## 2020-03-29 DIAGNOSIS — I150 Renovascular hypertension: Secondary | ICD-10-CM | POA: Diagnosis not present

## 2020-03-29 DIAGNOSIS — D631 Anemia in chronic kidney disease: Secondary | ICD-10-CM | POA: Diagnosis not present

## 2020-03-29 DIAGNOSIS — I248 Other forms of acute ischemic heart disease: Secondary | ICD-10-CM | POA: Diagnosis not present

## 2020-03-29 DIAGNOSIS — I5043 Acute on chronic combined systolic (congestive) and diastolic (congestive) heart failure: Secondary | ICD-10-CM | POA: Diagnosis not present

## 2020-03-29 DIAGNOSIS — N184 Chronic kidney disease, stage 4 (severe): Secondary | ICD-10-CM | POA: Diagnosis not present

## 2020-03-30 ENCOUNTER — Ambulatory Visit: Payer: Medicare Other | Admitting: Internal Medicine

## 2020-03-30 ENCOUNTER — Telehealth: Payer: Self-pay | Admitting: Internal Medicine

## 2020-03-30 DIAGNOSIS — I252 Old myocardial infarction: Secondary | ICD-10-CM

## 2020-03-30 DIAGNOSIS — I248 Other forms of acute ischemic heart disease: Secondary | ICD-10-CM | POA: Diagnosis not present

## 2020-03-30 DIAGNOSIS — D631 Anemia in chronic kidney disease: Secondary | ICD-10-CM | POA: Diagnosis not present

## 2020-03-30 DIAGNOSIS — I350 Nonrheumatic aortic (valve) stenosis: Secondary | ICD-10-CM | POA: Diagnosis not present

## 2020-03-30 DIAGNOSIS — N184 Chronic kidney disease, stage 4 (severe): Secondary | ICD-10-CM | POA: Diagnosis not present

## 2020-03-30 DIAGNOSIS — I2510 Atherosclerotic heart disease of native coronary artery without angina pectoris: Secondary | ICD-10-CM | POA: Diagnosis not present

## 2020-03-30 DIAGNOSIS — Z9981 Dependence on supplemental oxygen: Secondary | ICD-10-CM

## 2020-03-30 DIAGNOSIS — Z87891 Personal history of nicotine dependence: Secondary | ICD-10-CM

## 2020-03-30 DIAGNOSIS — E039 Hypothyroidism, unspecified: Secondary | ICD-10-CM | POA: Diagnosis not present

## 2020-03-30 DIAGNOSIS — E1151 Type 2 diabetes mellitus with diabetic peripheral angiopathy without gangrene: Secondary | ICD-10-CM | POA: Diagnosis not present

## 2020-03-30 DIAGNOSIS — E1122 Type 2 diabetes mellitus with diabetic chronic kidney disease: Secondary | ICD-10-CM | POA: Diagnosis not present

## 2020-03-30 DIAGNOSIS — Z951 Presence of aortocoronary bypass graft: Secondary | ICD-10-CM

## 2020-03-30 DIAGNOSIS — I5043 Acute on chronic combined systolic (congestive) and diastolic (congestive) heart failure: Secondary | ICD-10-CM | POA: Diagnosis not present

## 2020-03-30 DIAGNOSIS — I150 Renovascular hypertension: Secondary | ICD-10-CM | POA: Diagnosis not present

## 2020-03-30 DIAGNOSIS — Z9181 History of falling: Secondary | ICD-10-CM

## 2020-03-30 DIAGNOSIS — H353211 Exudative age-related macular degeneration, right eye, with active choroidal neovascularization: Secondary | ICD-10-CM | POA: Diagnosis not present

## 2020-03-30 DIAGNOSIS — E785 Hyperlipidemia, unspecified: Secondary | ICD-10-CM | POA: Diagnosis not present

## 2020-03-30 DIAGNOSIS — M1990 Unspecified osteoarthritis, unspecified site: Secondary | ICD-10-CM | POA: Diagnosis not present

## 2020-03-30 DIAGNOSIS — Z7982 Long term (current) use of aspirin: Secondary | ICD-10-CM

## 2020-03-30 NOTE — Telephone Encounter (Signed)
Ok for verbals 

## 2020-03-30 NOTE — Telephone Encounter (Signed)
[name] w/ [facility] is requesting verbals for nursing 2w1,1w5. She said that the patients BP yesterday was 150/60. She said that he held is Lasix yesterday because he was dizzy. She is also needing directions if the patient is needing to check his blood sugar. Please advise.   Okay to LVM: [phone].

## 2020-03-31 ENCOUNTER — Telehealth: Payer: Self-pay | Admitting: Internal Medicine

## 2020-03-31 ENCOUNTER — Telehealth: Payer: Self-pay

## 2020-03-31 ENCOUNTER — Telehealth: Payer: Self-pay | Admitting: Cardiovascular Disease

## 2020-03-31 DIAGNOSIS — I248 Other forms of acute ischemic heart disease: Secondary | ICD-10-CM | POA: Diagnosis not present

## 2020-03-31 DIAGNOSIS — D631 Anemia in chronic kidney disease: Secondary | ICD-10-CM | POA: Diagnosis not present

## 2020-03-31 DIAGNOSIS — I150 Renovascular hypertension: Secondary | ICD-10-CM | POA: Diagnosis not present

## 2020-03-31 DIAGNOSIS — E1122 Type 2 diabetes mellitus with diabetic chronic kidney disease: Secondary | ICD-10-CM | POA: Diagnosis not present

## 2020-03-31 DIAGNOSIS — I5043 Acute on chronic combined systolic (congestive) and diastolic (congestive) heart failure: Secondary | ICD-10-CM | POA: Diagnosis not present

## 2020-03-31 DIAGNOSIS — N184 Chronic kidney disease, stage 4 (severe): Secondary | ICD-10-CM | POA: Diagnosis not present

## 2020-03-31 MED ORDER — ACCU-CHEK AVIVA PLUS VI STRP: ORAL_STRIP | 12 refills | Status: DC

## 2020-03-31 NOTE — Telephone Encounter (Signed)
Ok to check sugar twice per day  If pt needs glucometer, I need to know the exact one covered by his insurance .

## 2020-03-31 NOTE — Telephone Encounter (Signed)
Done to [facility]

## 2020-03-31 NOTE — Telephone Encounter (Signed)
Patient called and would not specify why he was needing a call back. He can be reached at [phone].

## 2020-03-31 NOTE — Telephone Encounter (Signed)
Verbals given to [employer]

## 2020-03-31 NOTE — Telephone Encounter (Signed)
Pt c/o BP issue: STAT if pt c/o blurred vision, one-sided weakness or slurred speech  1. What are your last 5 BP readings? Sitting prior to exertion 150/80, standing 130/76 with some lightheadedness, after 15 minutes seated rest with moderate exertion 124/74  2. Are you having any other symptoms (ex. Dizziness, headache, blurred vision, passed out)? Lightheadedness   3. What is your BP issue? Herbert Deaner, physical therapist with [facility] states the patient has been having some lightheadedness. He states it mainly clears when he takes a nap or rests quietly for quite some time. He states the patient was to hold lasix Tuesday and returned to regular schedule Wednesday and Thursday. He states his weight has been consistent the last few days. Phone: [phone]

## 2020-03-31 NOTE — Telephone Encounter (Signed)
See ov 03/28/20 from [place], [facility]

## 2020-03-31 NOTE — Telephone Encounter (Signed)
The patient's home PT called the office today to report c/o lightheadedness. Called and spoke with the patient, who states he felt better on Tuesday when he held the Lasix, but dizziness/lightheadedness returned when he restarted the medication.  He states his dizziness and headaches occur mostly on exertion and improves with rest. He says his swelling is minimal.  Per Scott's last note, "Lightheadedness As noted, I am cutting back on his furosemide to see if this helps.  If not, he will contact us.  If his lightheadedness continues, I will set him up for a 3-day ZIO monitor to rule out bradycardia arrhythmia given his prior history of second-degree AV block."  This afternoon, a HH RN visited and his BP readings were: 138/58 sitting 140/62 standing  He understands he will be called with recommendations.

## 2020-03-31 NOTE — Telephone Encounter (Signed)
Error meant to send to triage

## 2020-04-01 MED ORDER — FUROSEMIDE 20 MG PO TABS: 20.0000 mg | ORAL_TABLET | ORAL | 3 refills | Status: DC

## 2020-04-01 NOTE — Telephone Encounter (Signed)
Change Furosemide to 20 mg every other day. Weigh daily. Take extra Furosemide 20 mg if weight increases 3 lbs or more in 24 hours. Call if lightheadedness continues. [name], PA-C    04/01/2020 5:34 AM

## 2020-04-01 NOTE — Telephone Encounter (Signed)
Pt advised of recommendation and agreeable to plan.

## 2020-04-04 ENCOUNTER — Encounter: Payer: Self-pay | Admitting: *Deleted

## 2020-04-04 DIAGNOSIS — I5043 Acute on chronic combined systolic (congestive) and diastolic (congestive) heart failure: Secondary | ICD-10-CM | POA: Diagnosis not present

## 2020-04-04 DIAGNOSIS — I248 Other forms of acute ischemic heart disease: Secondary | ICD-10-CM | POA: Diagnosis not present

## 2020-04-04 DIAGNOSIS — N184 Chronic kidney disease, stage 4 (severe): Secondary | ICD-10-CM | POA: Diagnosis not present

## 2020-04-04 DIAGNOSIS — E1122 Type 2 diabetes mellitus with diabetic chronic kidney disease: Secondary | ICD-10-CM | POA: Diagnosis not present

## 2020-04-04 DIAGNOSIS — D631 Anemia in chronic kidney disease: Secondary | ICD-10-CM | POA: Diagnosis not present

## 2020-04-04 DIAGNOSIS — I150 Renovascular hypertension: Secondary | ICD-10-CM | POA: Diagnosis not present

## 2020-04-04 NOTE — Telephone Encounter (Signed)
He has severe AS and is not a candidate for surgery or TAVR. I suspect his decreased exercise tolerance and lightheadedness are related to his worsening valve disease.  I would continue current Rx.  Monitor weights and call if weight increases 3 lbs or more in 1 day.  Keep f/u with Dr. Burt Knack next month as planned.  I will also fwd to Dr. Burt Knack as Juluis Rainier. [name], PA-C    04/04/2020 2:57 PM

## 2020-04-04 NOTE — Telephone Encounter (Signed)
Pt c/o BP issue: STAT if pt c/o blurred vision, one-sided weakness or slurred speech  1. What are your last 5 BP readings?   04/04/20:  Sitting- 140/80 Standing- 130/70 After walking 100 ft.- 130/60 After resting a few minutes- 130/70  2. Are you having any other symptoms (ex. Dizziness, headache, blurred vision, passed out)? No   3. What is your BP issue?    Clair Gulling, physical therapist with [facility], is following up the provide updated BP readings.  He states the patient's oxygen is at 93%. Clair Gulling also states the patient made him aware that his Furosemide has been changed from 20 mg daily to 20 mg every other day.  He requested a call back to confirm this change.  Per Clair Gulling, the patient also requested a paper copy of his most recent lab results, to be mailed to his home address.  Phone #: [phone]

## 2020-04-04 NOTE — Telephone Encounter (Signed)
Left message on Jim's voicemail to call office

## 2020-04-04 NOTE — Telephone Encounter (Signed)
I spoke with patient. He will call if weight gain of 3 lbs or more in a day and weigh daily.  He is aware of appointment with Dr Burt Knack in March

## 2020-04-04 NOTE — Telephone Encounter (Addendum)
I spoke with Clair Gulling and confirmed furosemide change as noted below. Clair Gulling reports patient has chronic lightheadedness.  Patient feels it has gotten mildly better since furosemide dose changed.  Weight is stable. Clair Gulling reports he has been seeing patient for last 2 weeks or so and patient's exercise tolerance has decreased since he was previously seen about a year ago.  Lab results have been mailed to patient

## 2020-04-05 ENCOUNTER — Telehealth: Payer: Self-pay | Admitting: Internal Medicine

## 2020-04-05 ENCOUNTER — Other Ambulatory Visit: Payer: Self-pay

## 2020-04-05 ENCOUNTER — Other Ambulatory Visit: Payer: Medicare Other | Admitting: Nurse Practitioner

## 2020-04-05 ENCOUNTER — Telehealth: Payer: Self-pay | Admitting: Cardiovascular Disease

## 2020-04-05 DIAGNOSIS — N184 Chronic kidney disease, stage 4 (severe): Secondary | ICD-10-CM | POA: Diagnosis not present

## 2020-04-05 DIAGNOSIS — Z515 Encounter for palliative care: Secondary | ICD-10-CM

## 2020-04-05 DIAGNOSIS — R531 Weakness: Secondary | ICD-10-CM

## 2020-04-05 DIAGNOSIS — D631 Anemia in chronic kidney disease: Secondary | ICD-10-CM | POA: Diagnosis not present

## 2020-04-05 DIAGNOSIS — I248 Other forms of acute ischemic heart disease: Secondary | ICD-10-CM | POA: Diagnosis not present

## 2020-04-05 DIAGNOSIS — E1122 Type 2 diabetes mellitus with diabetic chronic kidney disease: Secondary | ICD-10-CM | POA: Diagnosis not present

## 2020-04-05 DIAGNOSIS — I5043 Acute on chronic combined systolic (congestive) and diastolic (congestive) heart failure: Secondary | ICD-10-CM | POA: Diagnosis not present

## 2020-04-05 DIAGNOSIS — I150 Renovascular hypertension: Secondary | ICD-10-CM | POA: Diagnosis not present

## 2020-04-05 NOTE — Telephone Encounter (Signed)
Ok to change to check Mon - Wed- Fri

## 2020-04-05 NOTE — Telephone Encounter (Signed)
[name] from [facility] calling, wondering if it is still necessary for the patient to check his blood sugars daily. States the patient has been checking them everyday since he got released from the hospital and it has been steady between 100-110 and when they went to pick up the strips from the pharmacy they were not covered by medicare. Also stated before the patient went to the hospital he did not check his sugars daily. [name]- [phone] Okay to lvm

## 2020-04-05 NOTE — Progress Notes (Signed)
[facility] Consult Note Telephone: [phone]  Fax: [phone]  PATIENT NAME: Henry [name] [address] [place] [address] [phone] (home)  DOB: 15-Sep-1924 MRN: [id number]  PRIMARY CARE PROVIDER:    [name], MD,  [place] [address] [phone]  REFERRING PROVIDER:   [name], [address],  [place] [address] [phone]  RESPONSIBLE PARTY:   Extended Emergency Contact Information Primary Emergency Contact: [facility] Address: [address] [place], [place] [address] Johnnette Litter of [place] Phone: [phone] Mobile Phone: [phone] Relation: Daughter Secondary Emergency Contact: Lowella Petties Mobile Phone: [phone] Relation: Daughter  I met face to face with patient and daughter [name] in home. Daughter [name] called in via telephone during visit.   ASSESSMENT AND RECOMMENDATIONS:   Advance Care Planning: Patient is a former PMPM patient with [facility]. Goal of care: Goal of care is function. Patient want to get well and and have enough energy so can keep being independent.  Directives: Patient has a signed DNR form in home. He reiterated desire to not be resuscitated in the event of cardiac or respiratory distress. The need to complete a MOST form was discussed, patient expressed interest in completing a MOST form. Discussed and reviewed sections of the form in detail, opportunity for questions given, all questions answered. Form completed and signed with patient, signed form given to patient to keep, copy of MOST form uploaded to [facility] EMR. Details of MOST include DNR/no CPR, limited additional interventions, determine use or limitation of antibiotics when infection occurs, IV fluids if indicated, no feeding tube. Palliative care will continue to provided support to patient, family and medical team.  Symptom  Management:  Weakness: Patient report ongoing weakness and occassional lightheadedness since hospital discharge on 03/10/2020. He denied palpitation, or chest pain, denied syncope. He is on Furosemide 40m every other day, Hydralazine 125mevery 8hrs, and IMDUR 3056maily for HF management. Patient report stable weight and blood sugar reading range between 100-110. BP today 118/72, P 87. Patent report BP checked about 2hrs prior was 125/68. Patient expressed concern that the blood pressure is too low for him. No report of fever or chills. Plan: Phone call made to patient's cardiologist office Dr. CooBurt Knackessage left about patient's blood pressure reading today and patient's concerns. Told patient will be contacted for further instructions. Patient encouraged to pace self during activity to prevent exhaustion.   Follow up Palliative Care Visit: Palliative care will continue to follow for goals of care clarification and symptom management. Return in about 4 weeks or prn.   Family /Caregiver/Community Supports: Patient lives at home with his daughter JoaRemo Lippsis daughter [name] is a nurMarine scientistd helps with his medical decisions. Has aide that comes out Monday, Tuesdays, Thursdays and Fridays.  Cognitive / Functional decline: Patient awake, alert and coherent.  He is independent with his ADLs, walk with a cane, no report of recent falls.  I spent 60 minutes providing this consultation, time includes time spent with patient and family, chart review, provider coordination, and documentation. More than 50% of the time in this consultation was spent counseling and coordinating communication.   CHIEF COMPLAINT: Weakness  History obtained from review of EMR, discussion with patient and family. Records reviewed and summarized bellow.  HISTORY OF PRESENT ILLNESS:  JohZOLTON DOWSON a [age]o. 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 Patient is s/p hospitalization 03/04/2020 to 03/10/2020 for acte HF exacerbation. Palliative Care was asked to follow this patient by consultation request of [name], MD to help address advance care planning and goals of care. This is an initial visit.  CODE STATUS: DNR  PPS: 50%  HOSPICE ELIGIBILITY/DIAGNOSIS: TBD  PHYSICAL EXAM / ROS:   Vital signs: BP118/72, P 87, RR 16, 97% on room air Current and past weights: 132.2lbs down from 145 a year ago,  General:  frail appearing, cooperative, sitting in chair in NAD Cardiovascular: denied chest pain, denied palpitation, no edema  Pulmonary: no cough, no increased SOB, room air GI: appetite fair, denied constipation, continent of bowel GU: denies dysuria, continent of urine MSK:  no joint and ROM abnormalities, ambulatory Skin: no rashes or wounds reported Neurological: Weakness, but otherwise nonfocal Psych: non-anxious affect  PAST MEDICAL HISTORY:  Past Medical History:  Diagnosis Date  . Anemia   . CAD of autologous bypass graft   . Carotid artery disease ([place])   . Diabetes mellitus without complication ([place])   . DJD (degenerative joint disease)   . History of anemia of chronic disease   . Hypercholesteremia   . Hypertension   . NSTEMI (non-ST elevated myocardial infarction) ([place])   . PVD (peripheral vascular disease) with claudication ([place])   . Renal insufficiency   . Wears dentures   . Wears glasses     SOCIAL HX:  Social History   Tobacco Use  . Smoking status: Former Smoker    Packs/day: 1.00    Years: 10.00    Pack years: 10.00    Types: Cigarettes    Quit date: 02/26/1953    Years since quitting: 67.1  . Smokeless tobacco: Never Used  Substance Use Topics  . Alcohol use: Not Currently    Alcohol/week: 0.0 standard drinks   FAMILY HX:  Family History  Problem Relation Age of Onset  . Heart disease Mother   . Heart disease Father      ALLERGIES: No Known Allergies   PERTINENT MEDICATIONS:  Outpatient Encounter Medications as of 04/05/2020  Medication Sig  . aspirin EC 81 MG tablet Take 81 mg by mouth daily.   Marland Kitchen atorvastatin (LIPITOR) 40 MG tablet Take 1 tablet (40 mg total) by mouth every evening.  Marland Kitchen b complex-vitamin c-folic acid (NEPHRO-VITE) 0.8 MG TABS tablet Take 1 tablet by mouth at bedtime.  . calcitRIOL (ROCALTROL) 0.25 MCG capsule Take 0.25 mcg by mouth once a week. Monday  . calcium acetate (PHOSLO) 667 MG capsule Take 667 mg by mouth 2 (two) times daily with a meal.  . Darbepoetin Alfa (ARANESP) 100 MCG/0.5ML SOSY injection Inject 100 mcg into the skin every 30 (thirty) days.   . diphenoxylate-atropine (LOMOTIL) 2.5-0.025 MG tablet Take 1 tablet by mouth 4 (four) times daily as needed for diarrhea or loose stools.  . ferrous gluconate (FERGON) 324 MG tablet Take 324 mg by mouth daily.  . furosemide (LASIX) 20 MG tablet Take 1 tablet (20 mg total) by mouth every other day.  Marland Kitchen glucose blood (ACCU-CHEK AVIVA PLUS) test strip Use as instructed twice per day E11.9  . hydrALAZINE (APRESOLINE) 10 MG tablet Take 1 tablet (10 mg total) by mouth every 8 (eight) hours.  . isosorbide mononitrate (IMDUR) 30 MG 24 hr tablet Take 1 tablet (  30 mg total) by mouth daily.  . Lancets (ACCU-CHEK MULTICLIX) lancets USE TWICE DAILY.  Marland Kitchen levothyroxine (SYNTHROID) 50 MCG tablet Take 50 mcg by mouth daily before breakfast.  . Multiple Vitamins-Minerals (PRESERVISION AREDS 2 PO) Take 1 tablet by mouth in the morning and at bedtime.  . nitroGLYCERIN (NITROSTAT) 0.4 MG SL tablet Place 1 tablet (0.4 mg total) under the tongue every 5 (five) minutes as needed for chest pain.  Marland Kitchen Propylene Glycol (SYSTANE BALANCE) 0.6 % SOLN Place 1 drop into both eyes daily as needed (dry eyes).   No facility-administered encounter medications on file as of 04/05/2020.    Thank you for the opportunity to participate in the care of Mr. Roy Tokarz.The  palliative care team will continue to follow. Please call our office at [phone] if we can be of additional assistance.  Jari Favre, DNP, AGPCNP-BC

## 2020-04-05 NOTE — Telephone Encounter (Signed)
Henry Favre, NP with [facility] called during a palliative care home visit.  HR 87 118/72, patient expressed concern that his blood pressure was low for him. States he is lightheaded and weak. Repeat blood pressure 125/68. [place] called to make [place] aware and see if Dr. Copper would like to make any changes to his blood pressure medications. Advised I would forward and our office will call him IF he needs to make any changes.

## 2020-04-06 DIAGNOSIS — E1122 Type 2 diabetes mellitus with diabetic chronic kidney disease: Secondary | ICD-10-CM | POA: Diagnosis not present

## 2020-04-06 DIAGNOSIS — D631 Anemia in chronic kidney disease: Secondary | ICD-10-CM | POA: Diagnosis not present

## 2020-04-06 DIAGNOSIS — N184 Chronic kidney disease, stage 4 (severe): Secondary | ICD-10-CM | POA: Diagnosis not present

## 2020-04-06 DIAGNOSIS — I248 Other forms of acute ischemic heart disease: Secondary | ICD-10-CM | POA: Diagnosis not present

## 2020-04-06 DIAGNOSIS — I5043 Acute on chronic combined systolic (congestive) and diastolic (congestive) heart failure: Secondary | ICD-10-CM | POA: Diagnosis not present

## 2020-04-06 DIAGNOSIS — I150 Renovascular hypertension: Secondary | ICD-10-CM | POA: Diagnosis not present

## 2020-04-06 NOTE — Telephone Encounter (Signed)
LDVM with PCP instructions.

## 2020-04-07 ENCOUNTER — Telehealth: Payer: Self-pay | Admitting: Internal Medicine

## 2020-04-07 DIAGNOSIS — E1122 Type 2 diabetes mellitus with diabetic chronic kidney disease: Secondary | ICD-10-CM | POA: Diagnosis not present

## 2020-04-07 DIAGNOSIS — N184 Chronic kidney disease, stage 4 (severe): Secondary | ICD-10-CM | POA: Diagnosis not present

## 2020-04-07 DIAGNOSIS — D631 Anemia in chronic kidney disease: Secondary | ICD-10-CM | POA: Diagnosis not present

## 2020-04-07 DIAGNOSIS — I150 Renovascular hypertension: Secondary | ICD-10-CM | POA: Diagnosis not present

## 2020-04-07 DIAGNOSIS — I248 Other forms of acute ischemic heart disease: Secondary | ICD-10-CM | POA: Diagnosis not present

## 2020-04-07 DIAGNOSIS — I5043 Acute on chronic combined systolic (congestive) and diastolic (congestive) heart failure: Secondary | ICD-10-CM | POA: Diagnosis not present

## 2020-04-07 NOTE — Telephone Encounter (Signed)
   Patient calling to request test strip prescription be changed to state check ONCE a day. Patient states he spoke with the pharmacist and this what they recommend  Patient requesting call

## 2020-04-07 NOTE — Telephone Encounter (Signed)
[name] from [facility] has called again in regards to the blood sugar medication.   The patients daughter has went to the pharmacy and they stated the prescription was not covered by the insurance, Medicare. [name] stated that the pharmacy might need a code for the order.  She also wanted to double check on the instructions of the medication, confirming that it needs to be done twice daily on Mon-Wed-Fri  [name] stated to give her a call back at [phone]

## 2020-04-07 NOTE — Telephone Encounter (Signed)
[name] w/ [facility] called again and said that the patient has faint crackles to both of his lung bases. Also, had a drop in his BP, sitting he was 142/70 and standing 112/62.

## 2020-04-07 NOTE — Telephone Encounter (Signed)
With the lower BP, pt needs to hold the lasix for 4 days then resatrt and recheck BPs per home heatlh.  Pt should go to ED for any worsening sob.

## 2020-04-08 MED ORDER — ACCU-CHEK AVIVA PLUS VI STRP: ORAL_STRIP | 12 refills | Status: AC

## 2020-04-08 NOTE — Telephone Encounter (Signed)
Notified pt daughter to hold lasix for 4 days, and test strips have been ordered

## 2020-04-08 NOTE — Telephone Encounter (Signed)
New rx done

## 2020-04-08 NOTE — Addendum Note (Signed)
Addended by: Pricilla Holm A on: 04/08/2020 09:26 AM   Modules accepted: Orders

## 2020-04-10 DIAGNOSIS — Z20822 Contact with and (suspected) exposure to covid-19: Secondary | ICD-10-CM | POA: Diagnosis not present

## 2020-04-11 ENCOUNTER — Telehealth: Payer: Self-pay | Admitting: Cardiovascular Disease

## 2020-04-11 ENCOUNTER — Other Ambulatory Visit: Payer: Self-pay | Admitting: Internal Medicine

## 2020-04-11 DIAGNOSIS — R269 Unspecified abnormalities of gait and mobility: Secondary | ICD-10-CM

## 2020-04-11 NOTE — Telephone Encounter (Signed)
Mr. Fuson has several complaints. He reported at last visit with Nicki Reaper that he had dizziness.  He held Lasix as directed for several days and he felt a little better, but his swelling returned and eventually his dizziness worsened off the medication. He improved when he restarted the Lasix and has been taking 20 mg qAM ever since.  He complains now of dizziness and extreme fatigue, especially in the morning. It takes him about 30-35 minutes to get ready in the morning because he has to take so many breaks.  He still has swelling in his legs but it is improved.  He is afraid to drive because of the dizziness. He says he feels weak and just wants to be stronger so he can stay independent.  His weight is stable. He was 131.4 lbs today and 132 lb yesterday. BP=145/67 and HR 85 today. He will move slowly, especially when transitioning from lying to standing. He will continue to limit salt.  He requests recommendations from Dr. Burt Knack.

## 2020-04-11 NOTE — Telephone Encounter (Signed)
STAT if patient feels like he/she is going to faint   1) Are you dizzy now? Yes when doing things, but is feeling better   2) Do you feel faint or have you passed out? No   3) Do you have any other symptoms? SOB   4) Have you checked your HR and BP (record if available)? 144/70 04/07/20  Stanton Kidney is calling stating Neyland has been having issues with dizziness since he left the hospital on 03/04/20. She states while hospitalized they decreased his Lasix to 20 MG's every other day. Due to the dizziness Lambros's PCP advised him to hold his Lasix for 4 days which started on 04/07/20. On 04/09/20 Jef began feeling worse while holding his lasix, with more dizziness and SOB, as well as swelling in his ankles. Due to this Stanton Kidney had him take a 20 MG tablet of Lasix that evening and he began to feel a lot better. He has been taking one tablet of 20 MG daily since. They are requesting Dr. Antionette Char recommendation. Please advise.

## 2020-04-12 ENCOUNTER — Telehealth: Payer: Self-pay | Admitting: Cardiovascular Disease

## 2020-04-12 DIAGNOSIS — N184 Chronic kidney disease, stage 4 (severe): Secondary | ICD-10-CM | POA: Diagnosis not present

## 2020-04-12 DIAGNOSIS — E1122 Type 2 diabetes mellitus with diabetic chronic kidney disease: Secondary | ICD-10-CM | POA: Diagnosis not present

## 2020-04-12 DIAGNOSIS — I248 Other forms of acute ischemic heart disease: Secondary | ICD-10-CM | POA: Diagnosis not present

## 2020-04-12 DIAGNOSIS — I150 Renovascular hypertension: Secondary | ICD-10-CM | POA: Diagnosis not present

## 2020-04-12 DIAGNOSIS — D631 Anemia in chronic kidney disease: Secondary | ICD-10-CM | POA: Diagnosis not present

## 2020-04-12 DIAGNOSIS — I5043 Acute on chronic combined systolic (congestive) and diastolic (congestive) heart failure: Secondary | ICD-10-CM | POA: Diagnosis not present

## 2020-04-12 MED ORDER — HYDRALAZINE HCL 10 MG PO TABS: 10.0000 mg | ORAL_TABLET | Freq: Three times a day (TID) | ORAL | 0 refills | Status: DC

## 2020-04-12 MED ORDER — FUROSEMIDE 20 MG PO TABS: 20.0000 mg | ORAL_TABLET | ORAL | 3 refills | Status: DC

## 2020-04-12 NOTE — Telephone Encounter (Signed)
We may need to allow his blood pressure to run on the higher side in order to minimize symptoms. Would try holding hydralazine and follow symptoms.

## 2020-04-12 NOTE — Telephone Encounter (Signed)
Pt c/o medication issue:  1. Name of Medication:   hydrALAZINE (APRESOLINE) 10 MG tablet     2. How are you currently taking this medication (dosage and times per day)? N/A  3. Are you having a reaction (difficulty breathing--STAT)? N/A  4. What is your medication issue?    [name], RN  with [facility], is following up regarding medication instructions.  Specifically, she would like to verify that the patient is to hold his Hydralazine.  She states the patient also mentioned wanting to go back on oxygen. Please return call to [facility] to discuss further at [phone].

## 2020-04-12 NOTE — Telephone Encounter (Signed)
Left message to call back  

## 2020-04-12 NOTE — Telephone Encounter (Signed)
Pts daughter advised Dr. Antionette Char recommendation to hold the Hydralazine.Marland Kitchen she will monitor the Pts BP and symptoms... He had one dose this morning but will hold the rest of the day.   As per her phone note from this morning:   Patient's daughter called and wanted to tell Dr. Antionette Char nurse that the patient is very air hungry and can only say like 2 words without giving out of breath. Requesting if he could get some home oxygen. Please call back.   She has noted that he gets SOB with minimal exertion but does not drop his O2 sat into the 80's... she is asking for him to have O2 for comfort... I advised her to call his Palliative care nurse for their parameters for having the home O2... she has also called Dr. Jenny Reichmann that has declined the order but will talk with him if palliative care needs an order signed.   Will forward to Dr. Nance Pew.

## 2020-04-12 NOTE — Telephone Encounter (Signed)
[name] RN with [facility] is calling in requesting confirmation about how the patient should be taking his lasix. The patients daughter Stanton Kidney told Eustaquio Maize that one of the doctors told her to give it to him daily but can't remember which one.   [name] states that he was taking every other day when he left the hospital.

## 2020-04-12 NOTE — Telephone Encounter (Signed)
Patient's daughter called and wanted to tell Dr. Antionette Char nurse that the patient is very air hungry and can only say like 2 words without giving out of breath. Requesting if he could get some home oxygen. Please call back

## 2020-04-12 NOTE — Addendum Note (Signed)
Addended by: Stephani Police on: 04/12/2020 05:03 PM   Modules accepted: Orders

## 2020-04-12 NOTE — Telephone Encounter (Signed)
See documentation on phone note from 04/11/20 that addresses the pts daughter concerns.   Will close this note.

## 2020-04-14 ENCOUNTER — Telehealth: Payer: Self-pay | Admitting: Cardiovascular Disease

## 2020-04-14 DIAGNOSIS — E1122 Type 2 diabetes mellitus with diabetic chronic kidney disease: Secondary | ICD-10-CM | POA: Diagnosis not present

## 2020-04-14 DIAGNOSIS — I5043 Acute on chronic combined systolic (congestive) and diastolic (congestive) heart failure: Secondary | ICD-10-CM | POA: Diagnosis not present

## 2020-04-14 DIAGNOSIS — N184 Chronic kidney disease, stage 4 (severe): Secondary | ICD-10-CM | POA: Diagnosis not present

## 2020-04-14 DIAGNOSIS — I150 Renovascular hypertension: Secondary | ICD-10-CM | POA: Diagnosis not present

## 2020-04-14 DIAGNOSIS — D631 Anemia in chronic kidney disease: Secondary | ICD-10-CM | POA: Diagnosis not present

## 2020-04-14 DIAGNOSIS — I248 Other forms of acute ischemic heart disease: Secondary | ICD-10-CM | POA: Diagnosis not present

## 2020-04-14 NOTE — Telephone Encounter (Signed)
Clair Gulling, physical therapist with [facility], is calling to follow up regarding a summary of patient's condition faxed to the office on 04/06/20 or 04/07/20.  He would like to know if it was received. The summary discusses the patient's BP and recent symptoms, including exertional SOB and dizziness. No BP readings available.  Per Clair Gulling, last week the patient's BP was generally normal when sitting, but with any exertion both the systolic and diastolic decreased instantly. This week after medication changes, patient's BP remains generally normal when sitting and standing. However, after a few minutes of exertion patient becomes dizzy. Clair Gulling states the medication adjustment has helped and he would like to know what the patient should expect moving forward. Should patient continue with adjustment?  Per Clair Gulling, patient reports exertional SOB and requested to go on oxygen again. (see 04/11/20 phone note) Clair Gulling states the patient's oxygen has been in the mid 90's. He is not sure the oxygen is necessary, however, he has concerns regarding the patient's hemoglobin levels causing the SOB. He reports the patient has been taking extra iron.   Clair Gulling requested a call back to discuss at [phone].   STAT if patient feels like he/she is going to faint   1) Are you dizzy now? Clair Gulling is not currently with the patient  2) Do you feel faint or have you passed out? No   3) Do you have any other symptoms? SOB  4) Have you checked your HR and BP (record if available)? No log available  Pt c/o Shortness Of Breath: STAT if SOB developed within the last 24 hours or pt is noticeably SOB on the phone  1. Are you currently SOB (can you hear that pt is SOB on the phone)? Clair Gulling is not currently with the patient  2. How long have you been experiencing SOB? Past few weeks  3. Are you SOB when sitting or when up moving around? When up and moving around  4. Are you currently experiencing any other symptoms? No

## 2020-04-14 NOTE — Telephone Encounter (Signed)
I had a lengthy discussion with the patient and his daughter. They understand that a palliative approach to his care is all that we have to offer in the setting of his advanced age, kidney disease, and heart disease. Advised to increase lasix to 40 mg daily. Will try to prescribe oxygen to help with his shortness of breath in the setting of acute on chronic systolic heart failure. All of their questions are answered. They requested that I call his other daughter which I will do this evening.

## 2020-04-14 NOTE — Telephone Encounter (Signed)
See 2/14 phone note. Dr. Burt Knack personally spoke with the patient and his daughter.

## 2020-04-14 NOTE — Telephone Encounter (Signed)
Dr. Burt Knack spoke with the patient's daughter and gave instruction to increase Lasix to 40 mg for 3 days then 40 mg qod. Will try to arrange home O2 tomorrow.

## 2020-04-15 DIAGNOSIS — I150 Renovascular hypertension: Secondary | ICD-10-CM | POA: Diagnosis not present

## 2020-04-15 DIAGNOSIS — I5043 Acute on chronic combined systolic (congestive) and diastolic (congestive) heart failure: Secondary | ICD-10-CM | POA: Diagnosis not present

## 2020-04-15 DIAGNOSIS — N184 Chronic kidney disease, stage 4 (severe): Secondary | ICD-10-CM | POA: Diagnosis not present

## 2020-04-15 DIAGNOSIS — E1122 Type 2 diabetes mellitus with diabetic chronic kidney disease: Secondary | ICD-10-CM | POA: Diagnosis not present

## 2020-04-15 DIAGNOSIS — I248 Other forms of acute ischemic heart disease: Secondary | ICD-10-CM | POA: Diagnosis not present

## 2020-04-15 DIAGNOSIS — D631 Anemia in chronic kidney disease: Secondary | ICD-10-CM | POA: Diagnosis not present

## 2020-04-15 NOTE — Telephone Encounter (Signed)
For the patient to qualify for O2 ordered by Cardiology, he will have to come in for walk test and O2 readings. Will contact his Palliative Care provider to see if she can expedite.

## 2020-04-18 NOTE — Telephone Encounter (Addendum)
Called [facility]. Spoke with Hospice representative, who confirmed Henry [name] is in the palliative program. She forwarded the call to Henry [name]- the palliative care nurse (phone [phone]).  Left message for Henry [name] to call back to discuss home O2 initiation options.

## 2020-04-18 NOTE — Telephone Encounter (Signed)
Spoke with Maxwell Caul with [employer]. She will attempt to have O2 readings done at the patient's home. If they can get readings soon, Dr. Burt Knack will sign O2 orders on Wednesday. If not, [facility] providers will sign.

## 2020-04-19 ENCOUNTER — Telehealth: Payer: Self-pay | Admitting: Cardiovascular Disease

## 2020-04-19 DIAGNOSIS — I150 Renovascular hypertension: Secondary | ICD-10-CM | POA: Diagnosis not present

## 2020-04-19 DIAGNOSIS — I248 Other forms of acute ischemic heart disease: Secondary | ICD-10-CM | POA: Diagnosis not present

## 2020-04-19 DIAGNOSIS — D631 Anemia in chronic kidney disease: Secondary | ICD-10-CM | POA: Diagnosis not present

## 2020-04-19 DIAGNOSIS — I5043 Acute on chronic combined systolic (congestive) and diastolic (congestive) heart failure: Secondary | ICD-10-CM | POA: Diagnosis not present

## 2020-04-19 DIAGNOSIS — E1122 Type 2 diabetes mellitus with diabetic chronic kidney disease: Secondary | ICD-10-CM | POA: Diagnosis not present

## 2020-04-19 DIAGNOSIS — N184 Chronic kidney disease, stage 4 (severe): Secondary | ICD-10-CM | POA: Diagnosis not present

## 2020-04-19 NOTE — Telephone Encounter (Signed)
Left message for PT to call back

## 2020-04-19 NOTE — Telephone Encounter (Signed)
Pt c/o BP issue: STAT if pt c/o blurred vision, one-sided weakness or slurred speech  1. What are your last 5 BP readings? All taken 04/19/20 Seated at rest: 140/70 Standing at rest: 140/70 2 Minutes of Standing: 130/70 30 seconds of standing/ marching : 120/70 3 min of walking/movement: 130/60  2. Are you having any other symptoms (ex. Dizziness, headache, blurred vision, passed out)? Last 2 readings pt reported lightheadedness  3. What is your BP issue? Home Health PT called to report BP readings from visit today  Home Health PT also wanted to discuss the Lasix change. Home Health PT also wanted to confirm discontinuation of hydrALAZINE (APRESOLINE) 10 MG tablet  [facility] also requests that when medication changes are made that our office faxes them documentation of the change. Their fax # is [phone]. This will allow them to keep track of changes

## 2020-04-20 ENCOUNTER — Telehealth: Payer: Self-pay

## 2020-04-20 NOTE — Telephone Encounter (Signed)
Spoke with [employer]. Based off O2 readings, the patient does not qualify for home O2 from palliative perspective.  The NP will go see the patient tomorrow morning and see if he qualifies for Hospice (and then O2 would be covered).

## 2020-04-20 NOTE — Telephone Encounter (Signed)
Phone call placed to patient to offer visit on 2/24 @ 9am. Patient declined time of visit as it is too early due to his health. Will consult with Palliative NP and follow up with patient.

## 2020-04-21 ENCOUNTER — Telehealth: Payer: Self-pay | Admitting: Cardiovascular Disease

## 2020-04-21 ENCOUNTER — Telehealth: Payer: Self-pay | Admitting: Internal Medicine

## 2020-04-21 ENCOUNTER — Other Ambulatory Visit: Payer: Self-pay

## 2020-04-21 ENCOUNTER — Other Ambulatory Visit: Payer: Medicare Other | Admitting: Nurse Practitioner

## 2020-04-21 DIAGNOSIS — Z951 Presence of aortocoronary bypass graft: Secondary | ICD-10-CM | POA: Diagnosis not present

## 2020-04-21 DIAGNOSIS — R0600 Dyspnea, unspecified: Secondary | ICD-10-CM

## 2020-04-21 DIAGNOSIS — I150 Renovascular hypertension: Secondary | ICD-10-CM | POA: Diagnosis not present

## 2020-04-21 DIAGNOSIS — J8410 Pulmonary fibrosis, unspecified: Secondary | ICD-10-CM

## 2020-04-21 DIAGNOSIS — Z9981 Dependence on supplemental oxygen: Secondary | ICD-10-CM | POA: Diagnosis not present

## 2020-04-21 DIAGNOSIS — M1990 Unspecified osteoarthritis, unspecified site: Secondary | ICD-10-CM | POA: Diagnosis not present

## 2020-04-21 DIAGNOSIS — I5043 Acute on chronic combined systolic (congestive) and diastolic (congestive) heart failure: Secondary | ICD-10-CM | POA: Diagnosis not present

## 2020-04-21 DIAGNOSIS — I350 Nonrheumatic aortic (valve) stenosis: Secondary | ICD-10-CM | POA: Diagnosis not present

## 2020-04-21 DIAGNOSIS — E785 Hyperlipidemia, unspecified: Secondary | ICD-10-CM | POA: Diagnosis not present

## 2020-04-21 DIAGNOSIS — Z7982 Long term (current) use of aspirin: Secondary | ICD-10-CM | POA: Diagnosis not present

## 2020-04-21 DIAGNOSIS — N184 Chronic kidney disease, stage 4 (severe): Secondary | ICD-10-CM | POA: Diagnosis not present

## 2020-04-21 DIAGNOSIS — Z515 Encounter for palliative care: Secondary | ICD-10-CM | POA: Diagnosis not present

## 2020-04-21 DIAGNOSIS — I2510 Atherosclerotic heart disease of native coronary artery without angina pectoris: Secondary | ICD-10-CM | POA: Diagnosis not present

## 2020-04-21 DIAGNOSIS — Z9181 History of falling: Secondary | ICD-10-CM | POA: Diagnosis not present

## 2020-04-21 DIAGNOSIS — E1151 Type 2 diabetes mellitus with diabetic peripheral angiopathy without gangrene: Secondary | ICD-10-CM | POA: Diagnosis not present

## 2020-04-21 DIAGNOSIS — Z87891 Personal history of nicotine dependence: Secondary | ICD-10-CM | POA: Diagnosis not present

## 2020-04-21 DIAGNOSIS — E1122 Type 2 diabetes mellitus with diabetic chronic kidney disease: Secondary | ICD-10-CM | POA: Diagnosis not present

## 2020-04-21 DIAGNOSIS — J9611 Chronic respiratory failure with hypoxia: Secondary | ICD-10-CM

## 2020-04-21 DIAGNOSIS — D631 Anemia in chronic kidney disease: Secondary | ICD-10-CM | POA: Diagnosis not present

## 2020-04-21 DIAGNOSIS — I248 Other forms of acute ischemic heart disease: Secondary | ICD-10-CM | POA: Diagnosis not present

## 2020-04-21 DIAGNOSIS — E039 Hypothyroidism, unspecified: Secondary | ICD-10-CM | POA: Diagnosis not present

## 2020-04-21 DIAGNOSIS — I252 Old myocardial infarction: Secondary | ICD-10-CM | POA: Diagnosis not present

## 2020-04-21 NOTE — Telephone Encounter (Signed)
° ° °  Pt c/o medication issue:  1. Name of Medication: Lasix  2. How are you currently taking this medication (dosage and times per day)?   3. Are you having a reaction (difficulty breathing--STAT)?   4. What is your medication issue? [name] with [employer], she would like to clarify the dosage of pt's lasix. She also said during her last visit with pt, pt doesn't look good at all, he looks a little bit jaundice and ashey. She also said pt is lightheaded even when he just sit still. Pt feel very weak and keep saying that he needs more oxygen and couldn't breath. She checked pt's o2 and it's ok level.

## 2020-04-21 NOTE — Telephone Encounter (Signed)
Loyal Gambler from [facility] called   Patient is complaining of SOB with limited activity, light head, and weakness. The patient's family is interested in getting hospice care.   Please advise and call back at [phone]

## 2020-04-21 NOTE — Telephone Encounter (Signed)
Called [facility] to get Henry [name]'s contact information as number received was incorrect. Called Henry [name] at [phone]. Confirmed with her Dr. Burt Knack spoke with the patient's daughter 2/17 and instructed her to have him increase Lasix to 40 mg daily for 3 days then resume Lasix 40 mg qod. She states lasix 40 mg qod is what is he currently taking.  Henry [name] reports the patient really does not look well and Hospice may be best option.  Updated each other on conversations occurring with Palliative Care.  Informed her that per Inez Catalina with Hospice, Mbemena, NP was going to go to the patient's home today to discuss Palliative vs Hospice (as he could get O2 on Hospice) as it has been confirmed that the patient does not qualify for O2 based on values.   Will await Mbemena's note.

## 2020-04-21 NOTE — Telephone Encounter (Signed)
Ok referral is done.

## 2020-04-21 NOTE — Progress Notes (Addendum)
[facility] Consult Note Telephone: [phone]  Fax: [phone]  PATIENT NAME: Henry [name] [address] [place] [address] [phone] (home)  DOB: 08-Jul-1924 MRN: [id number]  PRIMARY CARE PROVIDER:    [name], MD,  [place] [address] [phone]  REFERRING PROVIDER:   [name], [place] [address],  [place] [address] [phone]  RESPONSIBLE PARTY:   Extended Emergency Contact Information Primary Emergency Contact: [facility] Address: [address], [place] [address] Johnnette Litter of [place] Phone: [phone] Mobile Phone: [phone] Relation: Daughter Secondary Emergency Contact: Lowella Petties Mobile Phone: [phone] Relation: Daughter  I met face to face with patient in home.   ASSESSMENT AND RECOMMENDATIONS:   Advance Care Planning: Patient's goal of care is function. He has signed MOST and DNR forms in home, copy on [facility] EMR. Details of MOST include; limited additional interventions, determine use or limitation of antibiotics when infection occurs, IV fluids if indicated, no feeding tube  Symptom Management: Reason for this visit is to evaluate patient for need for supplemental oxygen. Patient requesting for home supplemental oxygen. Dyspnea: Oxygen saturation while sitting was 96% on room air, his saturation after walking about 33f was 97%. Patient educated on the pathophysiology of condition, he was made aware that his problem is not oxygenation issue but rather a ventilation problem. We discussed that supplemental oxygen may not solve his dyspnea issues and his insurance would likely not cover the cost. He verbalized understanding, expressed disappointment that he may not qualify for supplemental oxygen.  Fatigue: Patient report fatigue and increased day time sleepiness. Report lightheadedness when  ambulating, non at rest, report feeling well when not walking, patient advised to go to ED for evaluation if symptoms worsens. Patient has chronic Anemia related to his end stage renal diease, GFR 18, CR 2.89, Hgb 9.6 on 03/25/2020. Denied any acute bleeding. Patient on Monthly Aranesp injections. Patient does not want dialysis. We discussed patient's current symptoms and what it means in the larger context of his on-going co-morbidities. Natural disease trajectory and expectations at EOL were discussed. I attempted to elicit values and goals of care important to the patient at this time. Patient report that he does not want to continue feeling the way he is feeling. Home hospice services and philosophy were discussed and offered to patient. Patient verbalized interest in the services but expressed concern about loosing his doctors, mainly his PCP and his cardiologist. Patient made aware that he would be able to continue seeing his primary care doctor if he agrees to be his attending when on Hospice. Patient want to discuss it with his doctors and children. Pateint advised to pace self during activity, safety precautions was discussed. Questions and concerns were addressed. Patient was encouraged to call with questions and/or concerns. My business card was provided. PCP office called with visit findings and to review patient case to evaluate appropriateness for Hospice care. Visit update was discussed on telephone with patient'ss daughter [name]. She was made aware of patient's deconditioning and need for serious discussion regarding next step in his care. She verbalized understanding stating she was given the same report by patient's home health nurse. Daughter report making arrangement to see patient this weekend.  Follow up Palliative Care Visit: Palliative care will continue to follow for complex decision making and symptom management. Return in about 4 weeks or  prn.  Family /Caregiver/Community  Supports: Patient lives at home with his daughter [name]. He has a paid personal caregiver that comes out Monday, Tuesdays, Thursdays and Fridays.  Cognitive / Functional decline: Patient alert and coherent. He however appeared more frail and fatigued. He requires moderate assist completing his ADLs, now walks with a front wheel walker, no report of recent falls  I spent 30 minutes providing this consultation, More than 50% of the time in this consultation was spent counseling and coordinating communication.   CHIEF COMPLAINT: Dyspnea with activity  History obtained from review of EMR and discussion with patient and daughter [name]. Records reviewed and summarized bellow.  HISTORY OF PRESENT ILLNESS:  Henry [name] is a 87 y.o. 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 Patient is s/p hospitalization 03/04/2020 to 03/10/2020 for acute HF exacerbation. Patient report worsening dyspnea even with minimal activity. However his oxygen saturation remains above 90s. Palliative care was asked to follow this patient to help address advance care planning and for complex decision making. This is a follow up visit from 85/09/2020.  CODE STATUS: DNR  PPS: 40% down from 50%  HOSPICE ELIGIBILITY/DIAGNOSIS: yes/endstage renal disease /advance heart failure  PHYSICAL EXAM / ROS:   Vital signs: BP 162/71, P 87, RR 16, 96% on room air General: chronically ill and frail appearing, cooperative, sitting in chair in NAD Cardiovascular: denied chest pain, denied palpitation, no LE edema  Pulmonary: no cough, no increased SOB, room air GI: appetite poor, denied constipation, continent of bowel GU: denies dysuria, continent of urine MSK:  no joint and ROM abnormalities, ambulatory Skin: no rashes or wounds reported, skin  pale and ashy Neurological: Weakness, but otherwise nonfocal Psych: non-anxious affect  PAST MEDICAL HISTORY:  Past Medical History:  Diagnosis Date  . Anemia   . CAD of autologous bypass graft   . Carotid artery disease ([facility])   . Diabetes mellitus without complication ([facility])   . DJD (degenerative joint disease)   . History of anemia of chronic disease   . Hypercholesteremia   . Hypertension   . NSTEMI (non-ST elevated myocardial infarction) ([facility])   . PVD (peripheral vascular disease) with claudication ([facility])   . Renal insufficiency   . Wears dentures   . Wears glasses     SOCIAL HX:  Social History   Tobacco Use  . Smoking status: Former Smoker    Packs/day: 1.00    Years: 10.00    Pack years: 10.00    Types: Cigarettes    Quit date: 02/26/1953    Years since quitting: 67.1  . Smokeless tobacco: Never Used  Substance Use Topics  . Alcohol use: Not Currently    Alcohol/week: 0.0 standard drinks   FAMILY HX:  Family History  Problem Relation Age of Onset  . Heart disease Mother   . Heart disease Father     ALLERGIES: No Known Allergies   PERTINENT MEDICATIONS:  Outpatient Encounter Medications as of 04/21/2020  Medication Sig  . aspirin EC 81 MG tablet Take 81 mg by mouth daily.   Marland Kitchen atorvastatin (LIPITOR) 40 MG tablet Take 1 tablet (40 mg total) by mouth every evening.  Marland Kitchen b complex-vitamin c-folic acid (NEPHRO-VITE) 0.8 MG TABS tablet Take 1 tablet by mouth at bedtime.  . calcitRIOL (ROCALTROL) 0.25 MCG capsule Take 0.25 mcg  by mouth once a week. Monday  . calcium acetate (PHOSLO) 667 MG capsule Take 667 mg by mouth 2 (two) times daily with a meal.  . Darbepoetin Alfa (ARANESP) 100 MCG/0.5ML SOSY injection Inject 100 mcg into the skin every 30 (thirty) days.   . diphenoxylate-atropine (LOMOTIL) 2.5-0.025 MG tablet Take 1 tablet by mouth 4 (four) times daily as needed for diarrhea or loose stools.  . ferrous gluconate (FERGON) 324 MG tablet Take 324 mg by mouth daily.   . furosemide (LASIX) 20 MG tablet Take 1 tablet (20 mg total) by mouth every other day. (PT IS TAKING DAILY SEE 04/12/20 NOTE)  . glucose blood (ACCU-CHEK AVIVA PLUS) test strip Use as instructed daily E11.9  . hydrALAZINE (APRESOLINE) 10 MG tablet Take 1 tablet (10 mg total) by mouth every 8 (eight) hours. (BEING HELD AS OF 04/12/20)  . isosorbide mononitrate (IMDUR) 30 MG 24 hr tablet Take 1 tablet (30 mg total) by mouth daily.  . Lancets (ACCU-CHEK MULTICLIX) lancets USE TWICE DAILY.  Marland Kitchen levothyroxine (SYNTHROID) 50 MCG tablet Take 50 mcg by mouth daily before breakfast.  . Multiple Vitamins-Minerals (PRESERVISION AREDS 2 PO) Take 1 tablet by mouth in the morning and at bedtime.  . nitroGLYCERIN (NITROSTAT) 0.4 MG SL tablet Place 1 tablet (0.4 mg total) under the tongue every 5 (five) minutes as needed for chest pain.  Marland Kitchen Propylene Glycol (SYSTANE BALANCE) 0.6 % SOLN Place 1 drop into both eyes daily as needed (dry eyes).   No facility-administered encounter medications on file as of 04/21/2020.   Thank you for the opportunity to participate in the care of Mr. Levon Boettcher. The palliative care team will continue to follow. Please call our office at [phone] if we can be of additional assistance.   Jari Favre, DNP, AGPCNP-BC

## 2020-04-21 NOTE — Telephone Encounter (Signed)
See 2/24 phone encounter. Spoke with [facility] today.

## 2020-04-22 ENCOUNTER — Telehealth: Payer: Self-pay | Admitting: Nurse Practitioner

## 2020-04-22 DIAGNOSIS — D631 Anemia in chronic kidney disease: Secondary | ICD-10-CM | POA: Diagnosis not present

## 2020-04-22 DIAGNOSIS — E1122 Type 2 diabetes mellitus with diabetic chronic kidney disease: Secondary | ICD-10-CM | POA: Diagnosis not present

## 2020-04-22 DIAGNOSIS — I150 Renovascular hypertension: Secondary | ICD-10-CM | POA: Diagnosis not present

## 2020-04-22 DIAGNOSIS — N184 Chronic kidney disease, stage 4 (severe): Secondary | ICD-10-CM | POA: Diagnosis not present

## 2020-04-22 DIAGNOSIS — I5043 Acute on chronic combined systolic (congestive) and diastolic (congestive) heart failure: Secondary | ICD-10-CM | POA: Diagnosis not present

## 2020-04-22 DIAGNOSIS — I248 Other forms of acute ischemic heart disease: Secondary | ICD-10-CM | POA: Diagnosis not present

## 2020-04-22 NOTE — Telephone Encounter (Signed)
LVM informing NP that referral has been placed per Dr Jenny Reichmann; call office if additional ques/concerns.

## 2020-04-22 NOTE — Telephone Encounter (Signed)
[facility]'s note complete. Hospice referral placed by PCP Dr. Jenny Reichmann.

## 2020-04-24 ENCOUNTER — Other Ambulatory Visit: Payer: Self-pay | Admitting: Internal Medicine

## 2020-04-24 NOTE — Telephone Encounter (Signed)
Please refill as per office routine med refill policy (all routine meds refilled for 3 mo or monthly per pt preference up to one year from last visit, then month to month grace period for 3 mo, then further med refills will have to be denied)  

## 2020-04-25 ENCOUNTER — Telehealth: Payer: Self-pay | Admitting: Cardiovascular Disease

## 2020-04-25 NOTE — Telephone Encounter (Signed)
Left message to call back  

## 2020-04-25 NOTE — Telephone Encounter (Signed)
Spoke with Henry [name], who states she talked to the patient's daughter this AM and they think his dizziness may be coming from Imdur. Per Dr. Burt Knack, informed [name] the patient may STOP Imdur and see how he feels. [name] reports the patient wants to see Dr. Burt Knack at his visit next week prior to initiating Hospice. She was grateful for assistance and agrees with plan.

## 2020-04-25 NOTE — Telephone Encounter (Signed)
STAT if patient feels like he/she is going to faint   1) Are you dizzy now? Unsure. [name] with [facility] states she is not currently with the patient.  2) Do you feel faint or have you passed out? No   3) Do you have any other symptoms? No   4) Have you checked your HR and BP (record if available)?  [name] states she does not have any readings with her, but the patient's BP/HR have been regular-high.

## 2020-04-25 NOTE — Telephone Encounter (Signed)
Pt c/o medication issue:  1. Name of Medication: isosorbide mononitrate (IMDUR) 30 MG 24 hr tablet  2. How are you currently taking this medication (dosage and times per day)? As directed   3. Are you having a reaction (difficulty breathing--STAT)? dizziness and severe headaches   4. What is your medication issue? Daughter wanted to know if Dr. Burt Knack had any recommendations  Per Daughter, patient thinks the headaches cause him to be dizzy.   Daughter tried to get the patient in to see Dr. Burt Knack sooner but no appointments were available. Please advise

## 2020-04-26 ENCOUNTER — Telehealth: Payer: Self-pay | Admitting: Internal Medicine

## 2020-04-26 DIAGNOSIS — E1122 Type 2 diabetes mellitus with diabetic chronic kidney disease: Secondary | ICD-10-CM | POA: Diagnosis not present

## 2020-04-26 DIAGNOSIS — I248 Other forms of acute ischemic heart disease: Secondary | ICD-10-CM | POA: Diagnosis not present

## 2020-04-26 DIAGNOSIS — N184 Chronic kidney disease, stage 4 (severe): Secondary | ICD-10-CM | POA: Diagnosis not present

## 2020-04-26 DIAGNOSIS — I150 Renovascular hypertension: Secondary | ICD-10-CM | POA: Diagnosis not present

## 2020-04-26 DIAGNOSIS — I5043 Acute on chronic combined systolic (congestive) and diastolic (congestive) heart failure: Secondary | ICD-10-CM | POA: Diagnosis not present

## 2020-04-26 DIAGNOSIS — D631 Anemia in chronic kidney disease: Secondary | ICD-10-CM | POA: Diagnosis not present

## 2020-04-26 NOTE — Telephone Encounter (Signed)
Ok for immodium otc prn

## 2020-04-26 NOTE — Telephone Encounter (Signed)
° °  Clair Gulling from [facility] calling to report patient states he has had diarrhea for 1 week He is unsure what medication patient should be taking  Phone [phone]

## 2020-04-27 DIAGNOSIS — N184 Chronic kidney disease, stage 4 (severe): Secondary | ICD-10-CM | POA: Diagnosis not present

## 2020-04-27 DIAGNOSIS — I248 Other forms of acute ischemic heart disease: Secondary | ICD-10-CM | POA: Diagnosis not present

## 2020-04-27 DIAGNOSIS — I5043 Acute on chronic combined systolic (congestive) and diastolic (congestive) heart failure: Secondary | ICD-10-CM | POA: Diagnosis not present

## 2020-04-27 DIAGNOSIS — H353211 Exudative age-related macular degeneration, right eye, with active choroidal neovascularization: Secondary | ICD-10-CM | POA: Diagnosis not present

## 2020-04-27 DIAGNOSIS — I150 Renovascular hypertension: Secondary | ICD-10-CM | POA: Diagnosis not present

## 2020-04-27 DIAGNOSIS — D631 Anemia in chronic kidney disease: Secondary | ICD-10-CM | POA: Diagnosis not present

## 2020-04-27 DIAGNOSIS — E1122 Type 2 diabetes mellitus with diabetic chronic kidney disease: Secondary | ICD-10-CM | POA: Diagnosis not present

## 2020-04-27 NOTE — Telephone Encounter (Signed)
Left Clair Gulling a voicemail that it is  gned        Ok for immodium otc prn

## 2020-04-28 ENCOUNTER — Telehealth: Payer: Self-pay | Admitting: Internal Medicine

## 2020-04-28 NOTE — Telephone Encounter (Signed)
See other 2/28 encounter. Spoke with [facility] and d/c'd Imdur.

## 2020-04-28 NOTE — Telephone Encounter (Signed)
Ok verbals

## 2020-04-28 NOTE — Telephone Encounter (Signed)
[name] w/ [facility] is requesting verbals for nursing for 1w3 for disease and medication management. Please advise  Phone: [phone]

## 2020-04-29 NOTE — Telephone Encounter (Signed)
Verbals given to [employer]

## 2020-05-03 ENCOUNTER — Telehealth: Payer: Self-pay | Admitting: Cardiovascular Disease

## 2020-05-03 DIAGNOSIS — N184 Chronic kidney disease, stage 4 (severe): Secondary | ICD-10-CM | POA: Diagnosis not present

## 2020-05-03 DIAGNOSIS — E1122 Type 2 diabetes mellitus with diabetic chronic kidney disease: Secondary | ICD-10-CM | POA: Diagnosis not present

## 2020-05-03 DIAGNOSIS — I150 Renovascular hypertension: Secondary | ICD-10-CM | POA: Diagnosis not present

## 2020-05-03 DIAGNOSIS — I5043 Acute on chronic combined systolic (congestive) and diastolic (congestive) heart failure: Secondary | ICD-10-CM | POA: Diagnosis not present

## 2020-05-03 DIAGNOSIS — D631 Anemia in chronic kidney disease: Secondary | ICD-10-CM | POA: Diagnosis not present

## 2020-05-03 DIAGNOSIS — I248 Other forms of acute ischemic heart disease: Secondary | ICD-10-CM | POA: Diagnosis not present

## 2020-05-03 NOTE — Telephone Encounter (Signed)
Home Health Verbal Orders - Caller/Agency:  Henry [name] with [facility]  Callback Number: [phone] Requesting OT/PT/Skilled Nursing/Social Work/Speech Therapy:   Bp  140/80- seated  140/80- standing  120/70 with exertion  He is very limited in walking and caring for himself do to this drop in bp for about a month  During the drop in BP he report lightheadedness

## 2020-05-03 NOTE — Telephone Encounter (Signed)
Left message for Henry [name] that Dr. Burt Knack will evaluate the patient at visit tomorrow and make any necessary changes.

## 2020-05-04 ENCOUNTER — Other Ambulatory Visit: Payer: Self-pay

## 2020-05-04 ENCOUNTER — Encounter: Payer: Self-pay | Admitting: Cardiovascular Disease

## 2020-05-04 ENCOUNTER — Ambulatory Visit (INDEPENDENT_AMBULATORY_CARE_PROVIDER_SITE_OTHER): Payer: Medicare Other | Admitting: Cardiovascular Disease

## 2020-05-04 VITALS — BP 130/70 | HR 91 | Ht 69.0 in | Wt 136.6 lb

## 2020-05-04 DIAGNOSIS — N185 Chronic kidney disease, stage 5: Secondary | ICD-10-CM | POA: Diagnosis not present

## 2020-05-04 DIAGNOSIS — I350 Nonrheumatic aortic (valve) stenosis: Secondary | ICD-10-CM

## 2020-05-04 DIAGNOSIS — I5020 Unspecified systolic (congestive) heart failure: Secondary | ICD-10-CM | POA: Diagnosis not present

## 2020-05-04 DIAGNOSIS — I2510 Atherosclerotic heart disease of native coronary artery without angina pectoris: Secondary | ICD-10-CM

## 2020-05-04 NOTE — Progress Notes (Signed)
Cardiology Office Note:    Date:  05/06/2020   ID:  Henry [name], DOB [birth year], MRN [id number]  PCP:  [name], MD   [facility]  Cardiologist:  Sherren Mocha, MD  Advanced Practice Provider:  No care team member to display Electrophysiologist:  None       Referring MD: [name], MD   Chief Complaint  Patient presents with  . Shortness of Breath    History of Present Illness:    Henry [name] is a 87 y.o. male with a hx of:  Coronary artery disease  ? S/p CABG in 2000 ? S/p NSTEMI1/2020in setting of acute diastolic CHF + KZL>>9/3 grafts patent (chronic occlusions)>>Med Rx  Aortic stenosis ? Severe (Echocardiogram 9/21): mean 18 mmHg, DI 0.26 - low flow low gradient) ? Echocardiogram 1/22: Mean 17 mmHg, DI 0.24, V-max 268 m/s ? Not a candidate for TAVR ? Risk of progression to ESRD outweighed benefit of treating AS >> palliative care  Heart failure with reduced ejection fraction  ? Echocardiogram 1/22: EF 30-35 ? Echocardiogram 9/21: EF 35-40 ? Echocardiogram 4/21: EF 50-55 ? Echocardiogram 02/2018: EF 50-55  Peripheral arterial disease - med Rx   Chronic kidney disease, stage V ? AVF failed; no dialysis access  ? Dr. 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 During AVF placement in 02/2018 >>beta-blockerDC'd   Orthostatic intolerance  DNR   The patient is here with his daughter today.  We have had a telephone discussion since his last visit with [name] in January of this year.  The patient has had progressive problems with fatigue, exertional dyspnea, and headache.  We are moving towards a palliative approach to his care in the setting of his multiple comorbid medical conditions outlined above.  He stopped isosorbide about 10 days ago but continues to have some problems with headaches.  He is short of breath with low-level activity and has trouble with  leg weakness and fatigue.  He has not had any recent chest pain or pressure.  Past Medical History:  Diagnosis Date  . Anemia   . CAD of autologous bypass graft   . Carotid artery disease ([facility])   . Diabetes mellitus without complication ([facility])   . DJD (degenerative joint disease)   . History of anemia of chronic disease   . Hypercholesteremia   . Hypertension   . NSTEMI (non-ST elevated myocardial infarction) ([facility])   . PVD (peripheral vascular disease) with claudication ([facility])   . Renal insufficiency   . Wears dentures   . Wears glasses     Past Surgical History:  Procedure Laterality Date  . A/V FISTULAGRAM N/A 07/15/2018   Procedure: A/V FISTULAGRAM - Left Arm;  Surgeon: Serafina Mitchell, MD;  Location: [facility];  Service: Cardiovascular;  Laterality: N/A;  . AV FISTULA PLACEMENT Left 03/20/2018   Procedure: ARTERIOVENOUS (AV) FISTULA CREATION LEFT ARM;  Surgeon: Serafina Mitchell, MD;  Location: [facility];  Service: Vascular;  Laterality: Left;  . cataract surgery    . COLONOSCOPY     Hx: of  . CORONARY ARTERY BYPASS GRAFT  2000   by Dr. Cyndia Bent  . LEFT HEART CATH AND CORS/GRAFTS ANGIOGRAPHY N/A 03/27/2018   Procedure: LEFT HEART CATH AND CORS/GRAFTS ANGIOGRAPHY;  Surgeon: Sherren Mocha, MD;  Location: [facility];  Service: Cardiovascular;  Laterality: N/A;  . LUMBAR LAMINECTOMY/DECOMPRESSION  MICRODISCECTOMY N/A 09/10/2012   Procedure: LUMBAR DECOMPRESSION,  IN SITU FUSION LUMBAR 4-5;  Surgeon: Melina Schools, MD;  Location: [facility];  Service: Orthopedics;  Laterality: N/A;  . MULTIPLE TOOTH EXTRACTIONS    . TONSILLECTOMY    . UPPER EXTREMITY VENOGRAPHY Left 07/15/2018   Procedure: UPPER EXTREMITY VENOGRAPHY;  Surgeon: Serafina Mitchell, MD;  Location: [facility];  Service: Cardiovascular;  Laterality: Left;  UPPER ARM    Current Medications: Current Meds  Medication Sig  . aspirin EC 81 MG tablet Take 81 mg by mouth daily.   Marland Kitchen b complex-vitamin c-folic acid  (NEPHRO-VITE) 0.8 MG TABS tablet Take 1 tablet by mouth at bedtime.  . calcitRIOL (ROCALTROL) 0.25 MCG capsule Take 0.25 mcg by mouth once a week. Monday  . calcium acetate (PHOSLO) 667 MG capsule Take 667 mg by mouth 2 (two) times daily with a meal.  . Darbepoetin Alfa (ARANESP) 100 MCG/0.5ML SOSY injection Inject 100 mcg into the skin every 30 (thirty) days.   . diphenoxylate-atropine (LOMOTIL) 2.5-0.025 MG tablet Take 1 tablet by mouth 4 (four) times daily as needed for diarrhea or loose stools.  . ferrous gluconate (FERGON) 324 MG tablet Take 324 mg by mouth daily.  . furosemide (LASIX) 20 MG tablet Take 1 tablet (20 mg total) by mouth every other day. (PT IS TAKING DAILY SEE 04/12/20 NOTE)  . glucose blood (ACCU-CHEK AVIVA PLUS) test strip Use as instructed daily E11.9  . Lancets (ACCU-CHEK MULTICLIX) lancets USE TWICE DAILY.  Marland Kitchen levothyroxine (SYNTHROID) 50 MCG tablet Take 50 mcg by mouth daily before breakfast.  . Multiple Vitamins-Minerals (PRESERVISION AREDS 2 PO) Take 1 tablet by mouth in the morning and at bedtime.  . nitroGLYCERIN (NITROSTAT) 0.4 MG SL tablet Place 1 tablet (0.4 mg total) under the tongue every 5 (five) minutes as needed for chest pain.  Marland Kitchen Propylene Glycol (SYSTANE BALANCE) 0.6 % SOLN Place 1 drop into both eyes daily as needed (dry eyes).  . [DISCONTINUED] atorvastatin (LIPITOR) 40 MG tablet TAKE 1 TABLET BY MOUTH ONCE DAILY IN THE EVENING  . [DISCONTINUED] isosorbide dinitrate (ISORDIL) 30 MG tablet Take 30 mg by mouth daily.     Allergies:   Patient has no known allergies.   Social History   Socioeconomic History  . Marital status: Married    Spouse name: [name]  . Number of children: 5  . Years of education: Not on file  . Highest education level: Not on file  Occupational History  . Occupation: retired    Fish farm manager: RETIRED  Tobacco Use  . Smoking status: Former Smoker    Packs/day: 1.00    Years: 10.00    Pack years: 10.00    Types: Cigarettes     Quit date: 02/26/1953    Years since quitting: 67.2  . Smokeless tobacco: Never Used  Vaping Use  . Vaping Use: Never used  Substance and Sexual Activity  . Alcohol use: Not Currently    Alcohol/week: 0.0 standard drinks  . Drug use: No  . Sexual activity: Not Currently  Other Topics Concern  . Not on file  Social History Narrative  . Not on file   Social Determinants of Health   Financial Resource Strain: Not on file  Food Insecurity: Not on file  Transportation Needs: Not on file  Physical Activity: Not on file  Stress: Not on file  Social Connections: Not on file     Family History: The patient's family history includes Heart disease  in his father and mother.  ROS:   Please see the history of present illness.    All other systems reviewed and are negative.  EKGs/Labs/Other Studies Reviewed:    The following studies were reviewed today: Echo 03/05/20: 1. Left ventricular ejection fraction, by estimation, is 30 to 35%. The  left ventricle has moderately decreased function. The left ventricle  demonstrates global hypokinesis. Left ventricular diastolic parameters are  consistent with Grade I diastolic  dysfunction (impaired relaxation).  2. Right ventricular systolic function is normal. The right ventricular  size is normal. There is mildly elevated pulmonary artery systolic  pressure.  3. Left atrial size was moderately dilated.  4. The mitral valve is normal in structure. Mild mitral valve  regurgitation. No evidence of mitral stenosis.  5. Mean gradient is consistent with mild aortic stenosis, unchanged form  10/2019. However, dimensionless index is 0.24. This is decreased from 0.26  on 10/2019. Consistent with at least moderate aortic stenosis. Cannot  exclude low-flow, low-gradient aortic  stenosis. The aortic valve is calcified. There is moderate calcification  of the aortic valve. There is moderate thickening of the aortic valve.  Aortic valve regurgitation  is mild. Moderate to severe aortic valve  stenosis. Aortic valve area, by VTI  measures 0.76 cm. Aortic valve mean gradient measures 17.0 mmHg. Aortic  valve Vmax measures 2.68 m/s.  6. The inferior vena cava is normal in size with <50% respiratory  variability, suggesting right atrial pressure of 8 mmHg.   EKG:  EKG is not ordered today.    Recent Labs: 11/09/2019: Magnesium 2.1 03/04/2020: ALT 15; B Natriuretic Peptide 808.0; TSH 6.507 03/25/2020: BUN 56; Creatinine, Ser 2.89; Hemoglobin 9.6; Platelets 335; Potassium 4.2; Sodium 135  Recent Lipid Panel    Component Value Date/Time   CHOL 158 08/13/2019 1203   CHOL 122 07/09/2018 1548   TRIG 113.0 08/13/2019 1203   HDL 53.60 08/13/2019 1203   HDL 47 07/09/2018 1548   CHOLHDL 3 08/13/2019 1203   VLDL 22.6 08/13/2019 1203   LDLCALC 82 08/13/2019 1203   LDLCALC 58 07/09/2018 1548   LDLDIRECT 96.2 04/04/2007 1049     Risk Assessment/Calculations:       Physical Exam:    VS:  BP 130/70   Pulse 91   Ht 5\' 9"  (1.753 m)   Wt 136 lb 9.6 oz (62 kg)   SpO2 95%   BMI 20.17 kg/m     Wt Readings from Last 3 Encounters:  05/04/20 136 lb 9.6 oz (62 kg)  03/28/20 131 lb (59.4 kg)  03/14/20 138 lb (62.6 kg)     GEN: Pleasant elderly male in no acute distress HEENT: Normal NECK: No JVD; No carotid bruits LYMPHATICS: No lymphadenopathy CARDIAC: RRR, 3/6 harsh late peaking systolic murmur at the right upper sternal border RESPIRATORY:  Clear to auscultation without rales, wheezing or rhonchi  ABDOMEN: Soft, non-tender, non-distended MUSCULOSKELETAL:  No edema; No deformity  SKIN: Warm and dry NEUROLOGIC:  Alert and oriented x 3 PSYCHIATRIC:  Normal affect   ASSESSMENT:    1. Nonrheumatic aortic valve stenosis   2. HFrEF (heart failure with reduced ejection fraction) ([facility])   3. CKD (chronic kidney disease) stage 5, GFR less than 15 ml/min ([facility])   4. Coronary artery disease involving native coronary artery of native heart  without angina pectoris    PLAN:    In order of problems listed above:  1. Most recent echocardiogram reviewed.  Patient with progressive LV dysfunction and  moderately severe aortic stenosis, likely low-flow low gradient aortic stenosis.  We have had extensive discussion about treatment options.  Considering the patient's kidney disease, he has not been felt to be a candidate for TAVR.  He has not wanted to consider dialysis which is completely appropriate at his age.  I am certain that the CTA, cardiac catheterization, and TAVR procedures would result in dialysis dependence for him.  We will continue with a palliative approach to his care. 2. Limited treatment options.  We are minimizing his medicines and using furosemide in order to avoid hypotension. 3. Followed closely by nephrology. 4. Likely contributing to his progressive LV dysfunction.  No angina at present.  We talked about hospice today.  The patient does not feel ready for this.  He wants to continue relationships with his physicians.  He is not really experiencing any pain at this time other than his problems with headache.  He did not understand that it is okay for him to take Tylenol.  He was concerned about his kidneys.  He will use Tylenol as needed.  If he needs something stronger he will touch base with Dr. Jenny Reichmann.        Medication Adjustments/Labs and Tests Ordered: Current medicines are reviewed at length with the patient today.  Concerns regarding medicines are outlined above.  No orders of the defined types were placed in this encounter.  No orders of the defined types were placed in this encounter.   Patient Instructions  Medication Instructions:  1) STOP ISOSORBIDE 2) STOP ATORVASTATIN 3) You may use Tylenol for headaches *If you need a refill on your cardiac medications before your next appointment, please call your pharmacy*   Follow-Up: You are scheduled with Dr. Burt Knack on 09/19/2020 at 1:40PM.     Signed, Sherren Mocha, MD  05/06/2020 5:58 AM    [facility]

## 2020-05-04 NOTE — Patient Instructions (Addendum)
Medication Instructions:  1) STOP ISOSORBIDE 2) STOP ATORVASTATIN 3) You may use Tylenol for headaches *If you need a refill on your cardiac medications before your next appointment, please call your pharmacy*   Follow-Up: You are scheduled with Dr. Burt Knack on 09/19/2020 at 1:40PM.

## 2020-05-05 ENCOUNTER — Telehealth: Payer: Self-pay | Admitting: Internal Medicine

## 2020-05-05 DIAGNOSIS — D631 Anemia in chronic kidney disease: Secondary | ICD-10-CM | POA: Diagnosis not present

## 2020-05-05 DIAGNOSIS — I248 Other forms of acute ischemic heart disease: Secondary | ICD-10-CM | POA: Diagnosis not present

## 2020-05-05 DIAGNOSIS — E1122 Type 2 diabetes mellitus with diabetic chronic kidney disease: Secondary | ICD-10-CM | POA: Diagnosis not present

## 2020-05-05 DIAGNOSIS — I150 Renovascular hypertension: Secondary | ICD-10-CM | POA: Diagnosis not present

## 2020-05-05 DIAGNOSIS — N184 Chronic kidney disease, stage 4 (severe): Secondary | ICD-10-CM | POA: Diagnosis not present

## 2020-05-05 DIAGNOSIS — I5043 Acute on chronic combined systolic (congestive) and diastolic (congestive) heart failure: Secondary | ICD-10-CM | POA: Diagnosis not present

## 2020-05-05 NOTE — Telephone Encounter (Signed)
[facility] Nurse called  Patient is having continuous episodes of diarrhea. Asking if it is okay to up the dosage of the  diphenoxylate-atropine (LOMOTIL) 2.5-0.025 MG tablet to 2 tablets every 6 hrs instead of 1 tablet every 6 hrs until the episodes decrease.   Please advise.     Preferred Pharmacy:  [facility] [address] [place] - [address] Phone:  [phone]  Fax:  [phone]

## 2020-05-05 NOTE — Telephone Encounter (Signed)
I do not see where he has ever been evaluated for diarrhea although this was prescribed in December and would recommend virtual visit with someone to assess and cannot recommend to increase dosing of lomotil.

## 2020-05-05 NOTE — Telephone Encounter (Signed)
Let a voicemail on Henry [name] from [facility] phone stating         I do not see where he has ever been evaluated for diarrhea although this was prescribed in December and would recommend virtual visit with someone to assess and cannot recommend to increase dosing of lomotil.

## 2020-05-10 ENCOUNTER — Telehealth: Payer: Self-pay | Admitting: Internal Medicine

## 2020-05-10 DIAGNOSIS — I5043 Acute on chronic combined systolic (congestive) and diastolic (congestive) heart failure: Secondary | ICD-10-CM | POA: Diagnosis not present

## 2020-05-10 DIAGNOSIS — I150 Renovascular hypertension: Secondary | ICD-10-CM | POA: Diagnosis not present

## 2020-05-10 DIAGNOSIS — E1122 Type 2 diabetes mellitus with diabetic chronic kidney disease: Secondary | ICD-10-CM | POA: Diagnosis not present

## 2020-05-10 DIAGNOSIS — D631 Anemia in chronic kidney disease: Secondary | ICD-10-CM | POA: Diagnosis not present

## 2020-05-10 DIAGNOSIS — N184 Chronic kidney disease, stage 4 (severe): Secondary | ICD-10-CM | POA: Diagnosis not present

## 2020-05-10 DIAGNOSIS — I248 Other forms of acute ischemic heart disease: Secondary | ICD-10-CM | POA: Diagnosis not present

## 2020-05-10 NOTE — Telephone Encounter (Signed)
Needs OV if possible 

## 2020-05-10 NOTE — Telephone Encounter (Signed)
Raelyn Mora called and said that the patient continues to have diarrhea. Please advise.

## 2020-05-11 NOTE — Telephone Encounter (Signed)
Patient needed to move previously scheduled appt

## 2020-05-12 DIAGNOSIS — I248 Other forms of acute ischemic heart disease: Secondary | ICD-10-CM | POA: Diagnosis not present

## 2020-05-12 DIAGNOSIS — E1122 Type 2 diabetes mellitus with diabetic chronic kidney disease: Secondary | ICD-10-CM | POA: Diagnosis not present

## 2020-05-12 DIAGNOSIS — I150 Renovascular hypertension: Secondary | ICD-10-CM | POA: Diagnosis not present

## 2020-05-12 DIAGNOSIS — D631 Anemia in chronic kidney disease: Secondary | ICD-10-CM | POA: Diagnosis not present

## 2020-05-12 DIAGNOSIS — N184 Chronic kidney disease, stage 4 (severe): Secondary | ICD-10-CM | POA: Diagnosis not present

## 2020-05-12 DIAGNOSIS — I5043 Acute on chronic combined systolic (congestive) and diastolic (congestive) heart failure: Secondary | ICD-10-CM | POA: Diagnosis not present

## 2020-05-17 ENCOUNTER — Ambulatory Visit: Payer: Medicare Other | Admitting: Internal Medicine

## 2020-05-19 ENCOUNTER — Other Ambulatory Visit: Payer: Self-pay

## 2020-05-20 ENCOUNTER — Encounter: Payer: Self-pay | Admitting: Internal Medicine

## 2020-05-20 ENCOUNTER — Telehealth: Payer: Self-pay

## 2020-05-20 ENCOUNTER — Ambulatory Visit (INDEPENDENT_AMBULATORY_CARE_PROVIDER_SITE_OTHER): Payer: Medicare Other | Admitting: Internal Medicine

## 2020-05-20 ENCOUNTER — Telehealth: Payer: Self-pay | Admitting: Internal Medicine

## 2020-05-20 VITALS — BP 140/72 | HR 81 | Temp 97.9°F | Ht 69.0 in | Wt 137.0 lb

## 2020-05-20 DIAGNOSIS — I5023 Acute on chronic systolic (congestive) heart failure: Secondary | ICD-10-CM | POA: Diagnosis not present

## 2020-05-20 DIAGNOSIS — I248 Other forms of acute ischemic heart disease: Secondary | ICD-10-CM | POA: Diagnosis not present

## 2020-05-20 DIAGNOSIS — N184 Chronic kidney disease, stage 4 (severe): Secondary | ICD-10-CM | POA: Diagnosis not present

## 2020-05-20 DIAGNOSIS — I150 Renovascular hypertension: Secondary | ICD-10-CM | POA: Diagnosis not present

## 2020-05-20 DIAGNOSIS — R5381 Other malaise: Secondary | ICD-10-CM

## 2020-05-20 DIAGNOSIS — I5043 Acute on chronic combined systolic (congestive) and diastolic (congestive) heart failure: Secondary | ICD-10-CM | POA: Diagnosis not present

## 2020-05-20 DIAGNOSIS — E559 Vitamin D deficiency, unspecified: Secondary | ICD-10-CM

## 2020-05-20 DIAGNOSIS — E1165 Type 2 diabetes mellitus with hyperglycemia: Secondary | ICD-10-CM | POA: Diagnosis not present

## 2020-05-20 DIAGNOSIS — E039 Hypothyroidism, unspecified: Secondary | ICD-10-CM

## 2020-05-20 DIAGNOSIS — I2510 Atherosclerotic heart disease of native coronary artery without angina pectoris: Secondary | ICD-10-CM | POA: Diagnosis not present

## 2020-05-20 DIAGNOSIS — N185 Chronic kidney disease, stage 5: Secondary | ICD-10-CM

## 2020-05-20 DIAGNOSIS — E1122 Type 2 diabetes mellitus with diabetic chronic kidney disease: Secondary | ICD-10-CM | POA: Diagnosis not present

## 2020-05-20 DIAGNOSIS — D631 Anemia in chronic kidney disease: Secondary | ICD-10-CM | POA: Diagnosis not present

## 2020-05-20 LAB — BASIC METABOLIC PANEL
BUN: 61 mg/dL — ABNORMAL HIGH (ref 6–23)
CO2: 23 mEq/L (ref 19–32)
Calcium: 9.7 mg/dL (ref 8.4–10.5)
Chloride: 99 mEq/L (ref 96–112)
Creatinine, Ser: 3.91 mg/dL — ABNORMAL HIGH (ref 0.40–1.50)
GFR: 12.58 mL/min — CL (ref >60.00)
Glucose, Bld: 165 mg/dL — ABNORMAL HIGH (ref 70–99)
Potassium: 4 mEq/L (ref 3.5–5.1)
Sodium: 133 mEq/L — ABNORMAL LOW (ref 135–145)

## 2020-05-20 LAB — CBC WITH DIFFERENTIAL/PLATELET
Basophils Absolute: 0 10*3/uL (ref 0.0–0.1)
Basophils Relative: 0.6 % (ref 0.0–3.0)
Eosinophils Absolute: 0 10*3/uL (ref 0.0–0.7)
Eosinophils Relative: 0.6 % (ref 0.0–5.0)
HCT: 33.5 % — ABNORMAL LOW (ref 39.0–52.0)
Hemoglobin: 10.9 g/dL — ABNORMAL LOW (ref 13.0–17.0)
Lymphocytes Relative: 13 % (ref 12.0–46.0)
Lymphs Abs: 0.7 10*3/uL (ref 0.7–4.0)
MCHC: 32.6 g/dL (ref 30.0–36.0)
MCV: 88.9 fl (ref 78.0–100.0)
Monocytes Absolute: 0.6 10*3/uL (ref 0.1–1.0)
Monocytes Relative: 10.5 % (ref 3.0–12.0)
Neutro Abs: 4.1 10*3/uL (ref 1.4–7.7)
Neutrophils Relative %: 75.3 % (ref 43.0–77.0)
Platelets: 233 10*3/uL (ref 150.0–400.0)
RBC: 3.77 Mil/uL — ABNORMAL LOW (ref 4.22–5.81)
RDW: 17 % — ABNORMAL HIGH (ref 11.5–15.5)
WBC: 5.4 10*3/uL (ref 4.0–10.5)

## 2020-05-20 LAB — TSH: TSH: 7.92 u[IU]/mL — ABNORMAL HIGH (ref 0.35–4.50)

## 2020-05-20 LAB — T4, FREE: Free T4: 0.94 ng/dL (ref 0.60–1.60)

## 2020-05-20 LAB — HEPATIC FUNCTION PANEL
ALT: 11 U/L (ref 0–53)
AST: 16 U/L (ref 0–37)
Albumin: 4 g/dL (ref 3.5–5.2)
Alkaline Phosphatase: 68 U/L (ref 39–117)
Bilirubin, Direct: 0.1 mg/dL (ref 0.0–0.3)
Total Bilirubin: 0.5 mg/dL (ref 0.2–1.2)
Total Protein: 7.3 g/dL (ref 6.0–8.3)

## 2020-05-20 LAB — HEMOGLOBIN A1C: Hgb A1c MFr Bld: 5.7 % (ref 4.6–6.5)

## 2020-05-20 MED ORDER — FUROSEMIDE 40 MG PO TABS: 40.0000 mg | ORAL_TABLET | Freq: Every day | ORAL | 3 refills | Status: DC

## 2020-05-20 NOTE — Telephone Encounter (Signed)
The patients daughter has called Loyal Gambler at [facility].  She wanted to inform her that her father has been having a poor appetite.   I have informed Loyal Gambler that he did come to his appointment today and he does have a follow up next week.   Loyal Gambler goes to see him Monday and will call us with any updates.  Loyal Gambler: [phone]

## 2020-05-20 NOTE — Patient Instructions (Addendum)
Ok to increase the lasix to 40 mg twice per day for 2 days (today and tomorrow), then go to once per day  Please check your weight every day first thing in the morning after the bathroom visit  You will be contacted regarding the referral for: Home Health ([facility]) with RN and OT if they are allowed to keep coming back  I think we can hold on the Hospice referral for this time being  Please take Vitamin D 50000 units weekly for 5 weeks as you have planned, then change to OTC Vitamin D3 at 2000 units per day, indefinitely.  Please continue all other medications as before, and refills have been done if requested.  Please have the pharmacy call with any other refills you may need..  Please keep your appointments with your specialists as you may have planned  You will be contacted regarding the referral for: Dr Burt Knack sooner than July 25  Please go to the [facility] Department in the first floor for the x-ray testing  Please go to the LAB at the blood drawing area for the tests to be done  You will be contacted by phone if any changes need to be made immediately.  Otherwise, you will receive a letter about your results with an explanation, but please check with MyChart first.  Please remember to sign up for MyChart if you have not done so, as this will be important to you in the future with finding out test results, communicating by private email, and scheduling acute appointments online when needed.  Please follow up HERE in 1 week

## 2020-05-20 NOTE — Telephone Encounter (Signed)
CRITICAL VALUE STICKER  CRITICAL VALUE: GFR 12.58  RECEIVER (on-site recipient of call): Elza Rafter rnc  [place] NOTIFIED: 3/35/21 at 1647  MESSENGER (representative from lab): Saa from [facility]  MD NOTIFIED: Dr Jenny Reichmann  TIME OF NOTIFICATION: 0740  RESPONSE: Awaiting response

## 2020-05-20 NOTE — Progress Notes (Signed)
Patient ID: Henry [name], male   DOB: 1924/11/11, 87 y.o.   MRN: [id number]        Chief Complaint: chf, worsening debility, low vit d, diarrhea and ? hospice       HPI:  Henry [name] is a 87 y.o. male here with c/o diarrhea but as he gets here the diarrhea has resolved and normal BM the last few days; does also have gradual worsening 2 months since hosp d/c of sob/doe and now some breathless to speech; Pt denies chest pain, wheezing, orthopnea, PND, increased LE swelling, palpitations, dizziness or syncope.  Has finished HH with OT recently but daughter asking if can be restarted.  Pt wondering if hospice appropriate but himself did not accept hospice referral 1 mo ago.  Tolerating vit d.         Wt Readings from Last 3 Encounters:  05/20/20 137 lb (62.1 kg)  05/04/20 136 lb 9.6 oz (62 kg)  03/28/20 131 lb (59.4 kg)   BP Readings from Last 3 Encounters:  05/20/20 140/72  05/04/20 130/70  03/28/20 (!) 157/63         Past Medical History:  Diagnosis Date  . Anemia   . CAD of autologous bypass graft   . Carotid artery disease ([place])   . Diabetes mellitus without complication ([place])   . DJD (degenerative joint disease)   . History of anemia of chronic disease   . Hypercholesteremia   . Hypertension   . NSTEMI (non-ST elevated myocardial infarction) ([place])   . PVD (peripheral vascular disease) with claudication ([place])   . Renal insufficiency   . Wears dentures   . Wears glasses    Past Surgical History:  Procedure Laterality Date  . A/V FISTULAGRAM N/A 07/15/2018   Procedure: A/V FISTULAGRAM - Left Arm;  Surgeon: Serafina Mitchell, MD;  Location: [facility];  Service: Cardiovascular;  Laterality: N/A;  . AV FISTULA PLACEMENT Left 03/20/2018   Procedure: ARTERIOVENOUS (AV) FISTULA CREATION LEFT ARM;  Surgeon: Serafina Mitchell, MD;  Location: [facility];  Service: Vascular;  Laterality: Left;  . cataract surgery    . COLONOSCOPY     Hx: of  . CORONARY ARTERY BYPASS GRAFT  2000    by Dr. Cyndia Bent  . LEFT HEART CATH AND CORS/GRAFTS ANGIOGRAPHY N/A 03/27/2018   Procedure: LEFT HEART CATH AND CORS/GRAFTS ANGIOGRAPHY;  Surgeon: Sherren Mocha, MD;  Location: [facility];  Service: Cardiovascular;  Laterality: N/A;  . LUMBAR LAMINECTOMY/DECOMPRESSION MICRODISCECTOMY N/A 09/10/2012   Procedure: LUMBAR DECOMPRESSION,  IN SITU FUSION LUMBAR 4-5;  Surgeon: Melina Schools, MD;  Location: [facility];  Service: Orthopedics;  Laterality: N/A;  . MULTIPLE TOOTH EXTRACTIONS    . TONSILLECTOMY    . UPPER EXTREMITY VENOGRAPHY Left 07/15/2018   Procedure: UPPER EXTREMITY VENOGRAPHY;  Surgeon: Serafina Mitchell, MD;  Location: [facility];  Service: Cardiovascular;  Laterality: Left;  UPPER ARM    reports that he quit smoking about 67 years ago. His smoking use included cigarettes. He has a 10.00 pack-year smoking history. He has never used smokeless tobacco. He reports previous alcohol use. He reports that he does not use drugs. family history includes Heart disease in his father and mother. No Known Allergies Current Outpatient Medications on File Prior to Visit  Medication Sig Dispense Refill  . aspirin EC 81 MG tablet Take 81 mg by mouth daily.     Marland Kitchen b complex-vitamin c-folic acid (NEPHRO-VITE) 0.8 MG TABS  tablet Take 1 tablet by mouth at bedtime.    . calcitRIOL (ROCALTROL) 0.25 MCG capsule Take 0.25 mcg by mouth once a week. Monday    . calcium acetate (PHOSLO) 667 MG capsule Take 667 mg by mouth 2 (two) times daily with a meal.    . Darbepoetin Alfa (ARANESP) 100 MCG/0.5ML SOSY injection Inject 100 mcg into the skin every 30 (thirty) days.     . diphenoxylate-atropine (LOMOTIL) 2.5-0.025 MG tablet Take 1 tablet by mouth 4 (four) times daily as needed for diarrhea or loose stools. 30 tablet 0  . ergocalciferol (VITAMIN D2) 1.25 MG (50000 UT) capsule TAKE ONE CAPSULE BY MOUTH ONCE EACH WEEK FOR VITAMIN D REPLACEMENT (50,000 UNITS = 1,250 MCG) TAKE ONCE A WEEK FOR THE NEXT 5 WEEKS  TO REPLETE VIT D LEVEL    . ferrous gluconate (FERGON) 324 MG tablet Take 324 mg by mouth daily.    Marland Kitchen glucose blood (ACCU-CHEK AVIVA PLUS) test strip Use as instructed daily E11.9 100 each 12  . Lancets (ACCU-CHEK MULTICLIX) lancets USE TWICE DAILY. 102 each 5  . Multiple Vitamin (MULTIVITAMIN WITH MINERALS) TABS tablet Take 1 tablet by mouth daily.    . Multiple Vitamins-Minerals (PRESERVISION AREDS 2 PO) Take 1 tablet by mouth in the morning and at bedtime.    . nitroGLYCERIN (NITROSTAT) 0.4 MG SL tablet Place 1 tablet (0.4 mg total) under the tongue every 5 (five) minutes as needed for chest pain. 50 tablet 3  . Propylene Glycol (SYSTANE BALANCE) 0.6 % SOLN Place 1 drop into both eyes daily as needed (dry eyes).     No current facility-administered medications on file prior to visit.        ROS:  All others reviewed and negative.  Objective        PE:  BP 140/72   Pulse 81   Temp 97.9 F (36.6 C) (Oral)   Ht 5\' 9"  (1.753 m)   Wt 137 lb (62.1 kg)   SpO2 97%   BMI 20.23 kg/m                 Constitutional: Pt appears in NAD               HENT: Head: NCAT.                Right Ear: External ear normal.                 Left Ear: External ear normal.                Eyes: . Pupils are equal, round, and reactive to light. Conjunctivae and EOM are normal               Nose: without d/c or deformity               Neck: Neck supple. Gross normal ROM               Cardiovascular: Normal rate and regular rhythm.                 Pulmonary/Chest: Effort normal and breath sounds without rales or wheezing.                Abd:  Soft, NT, ND, + BS, no organomegaly               Neurological: Pt is alert. At baseline orientation, motor grossly intact  Skin: Skin is warm. No rashes, no other new lesions, LE edema - very trace at worst               Psychiatric: Pt behavior is normal without agitation   Micro: none  Cardiac tracings I have personally interpreted today:   none  Pertinent Radiological findings (summarize): none   Lab Results  Component Value Date   WBC 5.4 05/20/2020   HGB 10.9 (L) 05/20/2020   HCT 33.5 (L) 05/20/2020   PLT 233.0 05/20/2020   GLUCOSE 165 (H) 05/20/2020   CHOL 158 08/13/2019   TRIG 113.0 08/13/2019   HDL 53.60 08/13/2019   LDLDIRECT 96.2 04/04/2007   LDLCALC 82 08/13/2019   ALT 11 05/20/2020   AST 16 05/20/2020   NA 133 (L) 05/20/2020   K 4.0 05/20/2020   CL 99 05/20/2020   CREATININE 3.91 (H) 05/20/2020   BUN 61 (H) 05/20/2020   CO2 23 05/20/2020   TSH 7.92 (H) 05/20/2020   PSA 1.47 01/03/2010   INR 1.13 03/25/2018   HGBA1C 5.7 05/20/2020   MICROALBUR 46.3 (H) 02/12/2019   Assessment/Plan:  Henry [name] is a 87 y.o. White or Caucasian [1] male with  has a past medical history of Anemia, CAD of autologous bypass graft, Carotid artery disease ([place]), Diabetes mellitus without complication ([place]), DJD (degenerative joint disease), History of anemia of chronic disease, Hypercholesteremia, Hypertension, NSTEMI (non-ST elevated myocardial infarction) ([place]), PVD (peripheral vascular disease) with claudication ([place]), Renal insufficiency, Wears dentures, and Wears glasses.  Systolic congestive heart failure (HCC) Now taking lasix 40 mg qod; ok for lasix 40 mg bid x 2 days, then 40 qd, daily wts, refer to cardiology sooner than July 25, f/u here 1 wk  CKD (chronic kidney disease) stage 5, GFR less than 15 ml/min (HCC) Lab Results  Component Value Date   CREATININE 3.91 (H) 05/20/2020   Stable overall, cont to avoid nephrotoxins  Debility Agree he may not be hospice appropriate just yet pending response to tx, for repeat HH with OT   Diabetes ([place]) Lab Results  Component Value Date   HGBA1C 5.7 05/20/2020   Stable, pt to continue current medical treatment  - diet, wt control   Hypothyroidism For f/u lab, pt to continue levothyroxine 50, but suspect may need higher dose   Vitamin D deficiency Tolerating  vit d 50 k, to finish weekly for 5 wks, then take 2000 u qd oral replacement  Followup: Return in about 1 week (around 05/27/2020).  Cathlean Cower, MD 05/21/2020 8:30 PM [facility]

## 2020-05-21 ENCOUNTER — Encounter: Payer: Self-pay | Admitting: Internal Medicine

## 2020-05-21 ENCOUNTER — Other Ambulatory Visit: Payer: Self-pay | Admitting: Internal Medicine

## 2020-05-21 MED ORDER — LEVOTHYROXINE SODIUM 75 MCG PO TABS: 75.0000 ug | ORAL_TABLET | Freq: Every day | ORAL | 3 refills | Status: AC

## 2020-05-21 NOTE — Assessment & Plan Note (Signed)
Lab Results  Component Value Date   CREATININE 3.91 (H) 05/20/2020   Stable overall, cont to avoid nephrotoxins

## 2020-05-21 NOTE — Assessment & Plan Note (Signed)
For f/u lab, pt to continue levothyroxine 50, but suspect may need higher dose

## 2020-05-21 NOTE — Assessment & Plan Note (Signed)
Lab Results  Component Value Date   HGBA1C 5.7 05/20/2020   Stable, pt to continue current medical treatment  - diet, wt control

## 2020-05-21 NOTE — Assessment & Plan Note (Signed)
Tolerating vit d 50 k, to finish weekly for 5 wks, then take 2000 u qd oral replacement

## 2020-05-21 NOTE — Assessment & Plan Note (Signed)
Now taking lasix 40 mg qod; ok for lasix 40 mg bid x 2 days, then 40 qd, daily wts, refer to cardiology sooner than July 25, f/u here 1 wk

## 2020-05-21 NOTE — Assessment & Plan Note (Signed)
Agree he may not be hospice appropriate just yet pending response to tx, for repeat HH with OT

## 2020-05-23 ENCOUNTER — Other Ambulatory Visit: Payer: Self-pay

## 2020-05-23 ENCOUNTER — Other Ambulatory Visit: Payer: Medicare Other | Admitting: Nurse Practitioner

## 2020-05-23 DIAGNOSIS — Z515 Encounter for palliative care: Secondary | ICD-10-CM | POA: Diagnosis not present

## 2020-05-23 DIAGNOSIS — G4700 Insomnia, unspecified: Secondary | ICD-10-CM

## 2020-05-23 NOTE — Progress Notes (Addendum)
[facility] Consult Note Telephone: [phone]  Fax: [phone]  PATIENT NAME: Henry [name] [address] [place] [address] [phone] (home)  DOB: 1982-02-20 MRN: [id number]  PRIMARY CARE PROVIDER:    [name], MD,  [place] [address] [phone]  REFERRING PROVIDER:   Biagio [name], [place],  [place] [address] [phone]  RESPONSIBLE PARTY:   Extended Emergency Contact Information Primary Emergency Contact: Henry [name] Address: [address] [place],  [address] Henry [name] of [place] Phone: [phone] Mobile Phone: [phone] Relation: Daughter Secondary Emergency Contact: Henry [name] Mobile Phone: [phone] Relation: Daughter  I met face to face with patient in home, daughter Henry [name] present at visit.  ASSESSMENT AND RECOMMENDATIONS:   Advance Care Planning: ACP reviewed with patient. Goal of care: Goal of care is function. Directives: Patient reiterated desire to not be resuscitated in the event of Cardiac or respiratory arrest. Signed DNR and MOST forms in home, copy on [facility] EMR. Details of MOST include; limited additional interventions, determine use or limitation of antibiotics when infection occurs, IV fluids if indicated, no feeding tube.   Symptom Management:  Insomnia: report difficulty falling asleep, report having less than 3 hrs sleep at night. Report feeling tired and exhausted during the day. Recommendation: recommend starting Melatonin 77m by mouth daily, take 372m-1hr before bedtime. If no improvement in sleep after 3 days, may take 66m13m2 tablets). Poor Appetite: report poor appetite, report meal intake of <50%. Weight 130.6lbs down from 146lbs three months ago. Patient currently consumes Nepro nutritional supplement, one can daily as needed. Patient TSH is reported to be 7.92, T4 0.94,  Levothyroxine dose adjusted by his PCP, increased from 89m13mo 75mc70mRecommendation: Continue Nepro nutritional supplement, consider taking one can consistently daily between meals. Patient declined appetite stimulating medication at this time, saying he can make himself eat if he is able to sleep well at night. Contingency plan at this time will be to consider ordering low dose Mirtazapine 7.5mg o15megace if poor appetite persist. Renal failure: GFR 12.58 down from 15 on 03/06/2020. Patient declines dialysis. No report of changes in urination, uremia or generalized swelling. Recommendation: continue supportive care. Continue monthly Aranesp injections for anemia, Hgb 10.9 on 05/20/2020. Addendum 05/30/2020 Patient daughter Henry [name] called report patient with continued poor appetite, and not sleeping well at night. [name] requested for medication to help improve patient's appetite. Prescription for Mirtazapine 7.5mg da21m at bedtime x 30 days, no refills, order sent to patient's Pharmacy. Family was encouraged to call with questions and/or concerns.  Follow up Palliative Care Visit: Palliative care will continue to follow for complex decision making and symptom management. Return in about 4 weeks or prn.  Family /Caregiver/Community Supports: Patient lives at home. He has a paid personal caregiver from [employer] out Monday, Tuesdays,Thursdaysand Fridays for 2 hrs. His daughter Henry [name] isRemo Lipps main caregiver.  Cognitive / Functional decline:  Patient awake, alert and coherent. He completes his ADLs independent, he however requires more time to complete them. Walks with a front wheel walker, no report of recent fall.   I spent 30 minutes providing this consultation, time includes time spent with patient, chart review, provider coordination, and documentation. More than 50% of the time in this consultation was spent counseling and coordinating communication.   CHIEF COMPLAINT:  Insomnia  History  obtained from review of EMR and discussion with patient and family. Records reviewed and summarized bellow.  HISTORY OF PRESENT ILLNESS:Henry J Marsicanois a 87 y.o.year old malewith multiple medical problems including, combined systolic and diastolic CHF (EF 38-45%), moderate to severe aortic stenosis (not candidate for TAVR), CAD s/p CABG, PAD, HTN, HLD, DM type 2, CKD V (refused dialysis). Patient complained of worsening insomnia, has problem falling asleep, symptoms ongoing in the last 3-4 weeks, condition not associated with any symptoms. He has not tried any medication to alleviate the condition. He denied uncontrolled pain, denied fever, denied chills, denied depression. Palliative care will continue to follow patient to help address advance care planning and for complex decision making. This is a follow upvisit from 04/22/2083.  CODE STATUS: DNR  PPS: 50%  HOSPICE ELIGIBILITY/DIAGNOSIS: TBD  ROS/staff/patient Constitutional: endorsed fatigue Cardiovascular: denies chest pain, denies palpitation Pulmonary: denies acute cough, denies increased SOB Abdomen: endorses poor appetite, denies constipation, denies incontinence of bowel GU: denies dysuria, denies incontinence of urine MSK:  endorses ROM limitations, no falls reported Neurological: endorses weakness, denies pain, denies insomnia Psych: Endorses positive mood Heme/lymph/immuno: denies bruises, abnormal bleeding   Physical Exam: Vital signs: BP 138/70, P 62, RR 18, 99% on room air Current and past weights: 130.6lbs (pt report) down from 146lb three months ago. Ht 5f9", BMI 19.3kg/m2 General: chronically ill and frail appearing, thin, sitting in chair in NAD CV:  no LE edema Pulmonary: no increased work of breathing, no cough, no audible wheezes, room air Abdomen: no ascites GU: deferred MSK: moves all extremities, no contractures of LE, ambulatory Skin: warm and dry, no rashes or wounds on visible  skin Neuro: Generalized weakness, A and O x 3 Psych: non-anxious affect today Hem/lymph/immuno: no widespread bruising   PAST MEDICAL HISTORY:  Past Medical History:  Diagnosis Date  . Anemia   . CAD of autologous bypass graft   . Carotid artery disease ([place]   . Diabetes mellitus without complication ([place]   . DJD (degenerative joint disease)   . History of anemia of chronic disease   . Hypercholesteremia   . Hypertension   . NSTEMI (non-ST elevated myocardial infarction) ([place]   . PVD (peripheral vascular disease) with claudication ([place]   . Renal insufficiency   . Wears dentures   . Wears glasses     SOCIAL HX:  Social History   Tobacco Use  . Smoking status: Former Smoker    Packs/day: 1.00    Years: 10.00    Pack years: 10.00    Types: Cigarettes    Quit date: 02/26/1953    Years since quitting: 67.2  . Smokeless tobacco: Never Used  Substance Use Topics  . Alcohol use: Not Currently    Alcohol/week: 0.0 standard drinks   FAMILY HX:  Family History  Problem Relation Age of Onset  . Heart disease Mother   . Heart disease Father     ALLERGIES: No Known Allergies   PERTINENT MEDICATIONS:  Outpatient Encounter Medications as of 05/23/2020  Medication Sig  . aspirin EC 81 MG tablet Take 81 mg by mouth daily.   .Marland Kitchenb complex-vitamin c-folic acid (NEPHRO-VITE) 0.8 MG TABS tablet Take 1 tablet by mouth at bedtime.  . calcitRIOL (ROCALTROL) 0.25 MCG capsule Take 0.25 mcg by mouth once a week. Monday  . calcium acetate (PHOSLO) 667 MG capsule Take 667 mg by mouth 2 (two) times daily with a meal.  . Darbepoetin Alfa (ARANESP) 100 MCG/0.5ML SOSY injection Inject  100 mcg into the skin every 30 (thirty) days.   . diphenoxylate-atropine (LOMOTIL) 2.5-0.025 MG tablet Take 1 tablet by mouth 4 (four) times daily as needed for diarrhea or loose stools.  . ergocalciferol (VITAMIN D2) 1.25 MG (50000 UT) capsule TAKE ONE CAPSULE BY MOUTH ONCE EACH WEEK FOR VITAMIN D REPLACEMENT  (50,000 UNITS = 1,250 MCG) TAKE ONCE A WEEK FOR THE NEXT 5 WEEKS TO REPLETE VIT D LEVEL  . ferrous gluconate (FERGON) 324 MG tablet Take 324 mg by mouth daily.  . furosemide (LASIX) 40 MG tablet Take 1 tablet (40 mg total) by mouth daily.  Marland Kitchen glucose blood (ACCU-CHEK AVIVA PLUS) test strip Use as instructed daily E11.9  . Lancets (ACCU-CHEK MULTICLIX) lancets USE TWICE DAILY.  Marland Kitchen levothyroxine (SYNTHROID) 75 MCG tablet Take 1 tablet (75 mcg total) by mouth daily.  . Multiple Vitamin (MULTIVITAMIN WITH MINERALS) TABS tablet Take 1 tablet by mouth daily.  . Multiple Vitamins-Minerals (PRESERVISION AREDS 2 PO) Take 1 tablet by mouth in the morning and at bedtime.  . nitroGLYCERIN (NITROSTAT) 0.4 MG SL tablet Place 1 tablet (0.4 mg total) under the tongue every 5 (five) minutes as needed for chest pain.  Marland Kitchen Propylene Glycol (SYSTANE BALANCE) 0.6 % SOLN Place 1 drop into both eyes daily as needed (dry eyes).   No facility-administered encounter medications on file as of 05/23/2020.    Thank you for the opportunity to participate in the care of Mr. Torrez Renfroe. The palliative care team will continue to follow. Please call our office at [phone] if we can be of additional assistance.  Jari Favre, DNP, AGPCNP-BC

## 2020-05-25 DIAGNOSIS — H353211 Exudative age-related macular degeneration, right eye, with active choroidal neovascularization: Secondary | ICD-10-CM | POA: Diagnosis not present

## 2020-05-27 ENCOUNTER — Telehealth: Payer: Self-pay | Admitting: Family Medicine

## 2020-05-27 ENCOUNTER — Other Ambulatory Visit: Payer: Self-pay

## 2020-05-27 ENCOUNTER — Encounter: Payer: Self-pay | Admitting: Internal Medicine

## 2020-05-27 ENCOUNTER — Ambulatory Visit (INDEPENDENT_AMBULATORY_CARE_PROVIDER_SITE_OTHER): Payer: Medicare Other | Admitting: Internal Medicine

## 2020-05-27 VITALS — BP 118/82 | HR 77 | Temp 97.7°F | Ht 69.0 in | Wt 137.0 lb

## 2020-05-27 DIAGNOSIS — E1165 Type 2 diabetes mellitus with hyperglycemia: Secondary | ICD-10-CM

## 2020-05-27 DIAGNOSIS — N185 Chronic kidney disease, stage 5: Secondary | ICD-10-CM

## 2020-05-27 DIAGNOSIS — I2510 Atherosclerotic heart disease of native coronary artery without angina pectoris: Secondary | ICD-10-CM | POA: Diagnosis not present

## 2020-05-27 DIAGNOSIS — I5023 Acute on chronic systolic (congestive) heart failure: Secondary | ICD-10-CM

## 2020-05-27 LAB — BASIC METABOLIC PANEL
BUN: 78 mg/dL — ABNORMAL HIGH (ref 6–23)
CO2: 24 mEq/L (ref 19–32)
Calcium: 9.7 mg/dL (ref 8.4–10.5)
Chloride: 98 mEq/L (ref 96–112)
Creatinine, Ser: 4.36 mg/dL — ABNORMAL HIGH (ref 0.40–1.50)
GFR: 11.04 mL/min — CL (ref >60.00)
Glucose, Bld: 101 mg/dL — ABNORMAL HIGH (ref 70–99)
Potassium: 4.1 mEq/L (ref 3.5–5.1)
Sodium: 134 mEq/L — ABNORMAL LOW (ref 135–145)

## 2020-05-27 NOTE — Telephone Encounter (Signed)
Patient seen in office by PCP today, noted history of stage 5 kidney disease. Received a call from [facility] noting GFR drop from 12.58 to 11.04. Will defer to PCP for any changes in care patient receiving Palliative Care with [facility]

## 2020-05-27 NOTE — Patient Instructions (Signed)
OK to cut back on the lasix to one pill 5 days per wk (you can choose any of the 5 days)  Please continue all other medications as before, and refills have been done if requested.  Please have the pharmacy call with any other refills you may need.  Please continue your efforts at being more active, low cholesterol diet, and weight control.  Please keep your appointments with your specialists as you may have planned  Please go to the LAB at the blood drawing area for the tests to be done  You will be contacted by phone if any changes need to be made immediately.  Otherwise, you will receive a letter about your results with an explanation, but please check with MyChart first.  Please remember to sign up for MyChart if you have not done so, as this will be important to you in the future with finding out test results, communicating by private email, and scheduling acute appointments online when needed.  Please make an Appointment to return in 1 month

## 2020-05-27 NOTE — Progress Notes (Signed)
Patient ID: Henry [name], male   DOB: 30-Sep-1924, 87 y.o.   MRN: [id number]        Chief Complaint: f/u chf, ckd       HPI:  Henry [name] is a 87 y.o. male here with daughter to fu above, has been taking lasix as recommended last visit, but curiously wt at home seemed 2-3 lbs less, then increased again to baseline, though denies increased po fluids  Leg swelling as decreased and Pt denies chest pain, increased sob or doe, wheezing, orthopnea, PND, palpitations, dizziness or syncope.  Pt not breathless with speaking today as per last visit. But new today is visible at least mild generalized weakness and fatigue and slowness in movements   Pt denies fever, wt loss, night sweats, loss of appetite, or other constitutional symptoms  Denies new neuro focal s/s.   Pt denies polydipsia, polyuria,  Wt Readings from Last 3 Encounters:  05/27/20 137 lb (62.1 kg)  05/20/20 137 lb (62.1 kg)  05/04/20 136 lb 9.6 oz (62 kg)   BP Readings from Last 3 Encounters:  05/27/20 118/82  05/20/20 140/72  05/04/20 130/70         Past Medical History:  Diagnosis Date  . Anemia   . CAD of autologous bypass graft   . Carotid artery disease ([employer])   . Diabetes mellitus without complication ([place])   . DJD (degenerative joint disease)   . History of anemia of chronic disease   . Hypercholesteremia   . Hypertension   . NSTEMI (non-ST elevated myocardial infarction) ([place])   . PVD (peripheral vascular disease) with claudication ([place])   . Renal insufficiency   . Wears dentures   . Wears glasses    Past Surgical History:  Procedure Laterality Date  . A/V FISTULAGRAM N/A 07/15/2018   Procedure: A/V FISTULAGRAM - Left Arm;  Surgeon: Serafina Mitchell, MD;  Location: [facility];  Service: Cardiovascular;  Laterality: N/A;  . AV FISTULA PLACEMENT Left 03/20/2018   Procedure: ARTERIOVENOUS (AV) FISTULA CREATION LEFT ARM;  Surgeon: Serafina Mitchell, MD;  [facility];  Service: Vascular;  Laterality: Left;   . cataract surgery    . COLONOSCOPY     Hx: of  . CORONARY ARTERY BYPASS GRAFT  2000   by Dr. Cyndia Bent  . LEFT HEART CATH AND CORS/GRAFTS ANGIOGRAPHY N/A 03/27/2018   Procedure: LEFT HEART CATH AND CORS/GRAFTS ANGIOGRAPHY;  Surgeon: Sherren Mocha, MD;  Location: [facility];  Service: Cardiovascular;  Laterality: N/A;  . LUMBAR LAMINECTOMY/DECOMPRESSION MICRODISCECTOMY N/A 09/10/2012   Procedure: LUMBAR DECOMPRESSION,  IN SITU FUSION LUMBAR 4-5;  Surgeon: Melina Schools, MD;  Location: [facility];  Service: Orthopedics;  Laterality: N/A;  . MULTIPLE TOOTH EXTRACTIONS    . TONSILLECTOMY    . UPPER EXTREMITY VENOGRAPHY Left 07/15/2018   Procedure: UPPER EXTREMITY VENOGRAPHY;  Surgeon: Serafina Mitchell, MD;  Location: [facility];  Service: Cardiovascular;  Laterality: Left;  UPPER ARM    reports that he quit smoking about 67 years ago. His smoking use included cigarettes. He has a 10.00 pack-year smoking history. He has never used smokeless tobacco. He reports previous alcohol use. He reports that he does not use drugs. family history includes Heart disease in his father and mother. No Known Allergies Current Outpatient Medications on File Prior to Visit  Medication Sig Dispense Refill  . aspirin EC 81 MG tablet Take 81 mg by mouth daily.     Marland Kitchen  b complex-vitamin c-folic acid (NEPHRO-VITE) 0.8 MG TABS tablet Take 1 tablet by mouth at bedtime.    . calcitRIOL (ROCALTROL) 0.25 MCG capsule Take 0.25 mcg by mouth once a week. Monday    . calcium acetate (PHOSLO) 667 MG capsule Take 667 mg by mouth 2 (two) times daily with a meal.    . Darbepoetin Alfa (ARANESP) 100 MCG/0.5ML SOSY injection Inject 100 mcg into the skin every 30 (thirty) days.     . diphenoxylate-atropine (LOMOTIL) 2.5-0.025 MG tablet Take 1 tablet by mouth 4 (four) times daily as needed for diarrhea or loose stools. 30 tablet 0  . ergocalciferol (VITAMIN D2) 1.25 MG (50000 UT) capsule TAKE ONE CAPSULE BY MOUTH ONCE EACH WEEK  FOR VITAMIN D REPLACEMENT (50,000 UNITS = 1,250 MCG) TAKE ONCE A WEEK FOR THE NEXT 5 WEEKS TO REPLETE VIT D LEVEL    . ferrous gluconate (FERGON) 324 MG tablet Take 324 mg by mouth daily.    . furosemide (LASIX) 40 MG tablet Take 1 tablet (40 mg total) by mouth daily. 90 tablet 3  . glucose blood (ACCU-CHEK AVIVA PLUS) test strip Use as instructed daily E11.9 100 each 12  . Lancets (ACCU-CHEK MULTICLIX) lancets USE TWICE DAILY. 102 each 5  . levothyroxine (SYNTHROID) 75 MCG tablet Take 1 tablet (75 mcg total) by mouth daily. 90 tablet 3  . meclizine (ANTIVERT) 25 MG tablet Take 25 mg by mouth 3 (three) times daily as needed for dizziness.    . Multiple Vitamin (MULTIVITAMIN WITH MINERALS) TABS tablet Take 1 tablet by mouth daily.    . Multiple Vitamins-Minerals (PRESERVISION AREDS 2 PO) Take 1 tablet by mouth in the morning and at bedtime.    . nitroGLYCERIN (NITROSTAT) 0.4 MG SL tablet Place 1 tablet (0.4 mg total) under the tongue every 5 (five) minutes as needed for chest pain. 50 tablet 3  . Propylene Glycol (SYSTANE BALANCE) 0.6 % SOLN Place 1 drop into both eyes daily as needed (dry eyes).     No current facility-administered medications on file prior to visit.        ROS:  All others reviewed and negative.  Objective        PE:  BP 118/82 (BP Location: Right Arm, Patient Position: Sitting, Cuff Size: Normal)   Pulse 77   Temp 97.7 F (36.5 C) (Oral)   Ht 5\' 9"  (1.753 m)   Wt 137 lb (62.1 kg)   SpO2 90%   BMI 20.23 kg/m                 Constitutional: Pt appears in NAD but fatigued and slower in movements, though no longer breathless               HENT: Head: NCAT.                Right Ear: External ear normal.                 Left Ear: External ear normal.                Eyes: . Pupils are equal, round, and reactive to light. Conjunctivae and EOM are normal               Nose: without d/c or deformity               Neck: Neck supple. Gross normal ROM  Cardiovascular: Normal rate and regular rhythm.                 Pulmonary/Chest: Effort normal and breath sounds without rales or wheezing.                Abd:  Soft, NT, ND, + BS, no organomegaly               Neurological: Pt is alert. At baseline orientation, motor grossly intact               Skin: Skin is warm. No rashes, no other new lesions, LE edema - trace bilat left > right               Psychiatric: Pt behavior is normal without agitation   Micro: none  Cardiac tracings I have personally interpreted today:  none  Pertinent Radiological findings (summarize): none   Lab Results  Component Value Date   WBC 5.4 05/20/2020   HGB 10.9 (L) 05/20/2020   HCT 33.5 (L) 05/20/2020   PLT 233.0 05/20/2020   GLUCOSE 101 (H) 05/27/2020   CHOL 158 08/13/2019   TRIG 113.0 08/13/2019   HDL 53.60 08/13/2019   LDLDIRECT 96.2 04/04/2007   LDLCALC 82 08/13/2019   ALT 11 05/20/2020   AST 16 05/20/2020   NA 134 (L) 05/27/2020   K 4.1 05/27/2020   CL 98 05/27/2020   CREATININE 4.36 (H) 05/27/2020   BUN 78 (H) 05/27/2020   CO2 24 05/27/2020   TSH 7.92 (H) 05/20/2020   PSA 1.47 01/03/2010   INR 1.13 03/25/2018   HGBA1C 5.7 05/20/2020   MICROALBUR 46.3 (H) 02/12/2019   Assessment/Plan:  Henry [name] is a 87 y.o. White or Caucasian [1] male with  has a past medical history of Anemia, CAD of autologous bypass graft, Carotid artery disease ([place]), Diabetes mellitus without complication ([place]), DJD (degenerative joint disease), History of anemia of chronic disease, Hypercholesteremia, Hypertension, NSTEMI (non-ST elevated myocardial infarction) ([place]), PVD (peripheral vascular disease) with claudication ([place]), Renal insufficiency, Wears dentures, and Wears glasses.  Systolic congestive heart failure (HCC) Volume overload resolved, but appears to be possibly mild undervolume now due to general findings; I recommended decreased lasix to 5 days per wk only, for bmp with lab today  Diabetes  [facility]) Lab Results  Component Value Date   HGBA1C 5.7 05/20/2020   Stable, pt to continue current medical treatment  - diet   CKD (chronic kidney disease) stage 5, GFR less than 15 ml/min (HCC) For f/u bmp today r/o worsening with lasix  Followup: Return in about 4 weeks (around 06/24/2020).  Cathlean Cower, MD 05/28/2020 7:42 PM [facility]

## 2020-05-28 ENCOUNTER — Encounter: Payer: Self-pay | Admitting: Internal Medicine

## 2020-05-28 NOTE — Assessment & Plan Note (Signed)
Lab Results  Component Value Date   HGBA1C 5.7 05/20/2020   Stable, pt to continue current medical treatment  - diet

## 2020-05-28 NOTE — Assessment & Plan Note (Signed)
For f/u bmp today r/o worsening with lasix

## 2020-05-28 NOTE — Assessment & Plan Note (Signed)
Volume overload resolved, but appears to be possibly mild undervolume now due to general findings; I recommended decreased lasix to 5 days per wk only, for bmp with lab today

## 2020-06-01 ENCOUNTER — Encounter (HOSPITAL_COMMUNITY): Payer: Self-pay | Admitting: Emergency Medicine

## 2020-06-01 ENCOUNTER — Other Ambulatory Visit: Payer: Self-pay

## 2020-06-01 ENCOUNTER — Emergency Department (HOSPITAL_COMMUNITY): Payer: Medicare Other

## 2020-06-01 ENCOUNTER — Inpatient Hospital Stay (HOSPITAL_COMMUNITY)
Admission: EM | Admit: 2020-06-01 | Discharge: 2020-06-05 | DRG: 291 | Disposition: A | Discharge status: Home / Self Care | Payer: Medicare Other | Attending: Internal Medicine | Admitting: Internal Medicine

## 2020-06-01 DIAGNOSIS — N185 Chronic kidney disease, stage 5: Secondary | ICD-10-CM | POA: Diagnosis present

## 2020-06-01 DIAGNOSIS — Z634 Disappearance and death of family member: Secondary | ICD-10-CM

## 2020-06-01 DIAGNOSIS — I10 Essential (primary) hypertension: Secondary | ICD-10-CM | POA: Diagnosis not present

## 2020-06-01 DIAGNOSIS — D631 Anemia in chronic kidney disease: Secondary | ICD-10-CM | POA: Diagnosis not present

## 2020-06-01 DIAGNOSIS — R0602 Shortness of breath: Secondary | ICD-10-CM | POA: Diagnosis not present

## 2020-06-01 DIAGNOSIS — R918 Other nonspecific abnormal finding of lung field: Secondary | ICD-10-CM | POA: Diagnosis not present

## 2020-06-01 DIAGNOSIS — E1165 Type 2 diabetes mellitus with hyperglycemia: Secondary | ICD-10-CM

## 2020-06-01 DIAGNOSIS — Z8249 Family history of ischemic heart disease and other diseases of the circulatory system: Secondary | ICD-10-CM

## 2020-06-01 DIAGNOSIS — I2510 Atherosclerotic heart disease of native coronary artery without angina pectoris: Secondary | ICD-10-CM | POA: Diagnosis not present

## 2020-06-01 DIAGNOSIS — I4891 Unspecified atrial fibrillation: Secondary | ICD-10-CM | POA: Diagnosis not present

## 2020-06-01 DIAGNOSIS — I5043 Acute on chronic combined systolic (congestive) and diastolic (congestive) heart failure: Secondary | ICD-10-CM

## 2020-06-01 DIAGNOSIS — Z20822 Contact with and (suspected) exposure to covid-19: Secondary | ICD-10-CM | POA: Diagnosis present

## 2020-06-01 DIAGNOSIS — I132 Hypertensive heart and chronic kidney disease with heart failure and with stage 5 chronic kidney disease, or end stage renal disease: Secondary | ICD-10-CM | POA: Diagnosis present

## 2020-06-01 DIAGNOSIS — I509 Heart failure, unspecified: Principal | ICD-10-CM

## 2020-06-01 DIAGNOSIS — Z7982 Long term (current) use of aspirin: Secondary | ICD-10-CM | POA: Diagnosis not present

## 2020-06-01 DIAGNOSIS — N19 Unspecified kidney failure: Secondary | ICD-10-CM | POA: Diagnosis not present

## 2020-06-01 DIAGNOSIS — I252 Old myocardial infarction: Secondary | ICD-10-CM | POA: Diagnosis not present

## 2020-06-01 DIAGNOSIS — E860 Dehydration: Secondary | ICD-10-CM | POA: Diagnosis present

## 2020-06-01 DIAGNOSIS — I120 Hypertensive chronic kidney disease with stage 5 chronic kidney disease or end stage renal disease: Secondary | ICD-10-CM | POA: Diagnosis not present

## 2020-06-01 DIAGNOSIS — N179 Acute kidney failure, unspecified: Secondary | ICD-10-CM | POA: Diagnosis not present

## 2020-06-01 DIAGNOSIS — Z87891 Personal history of nicotine dependence: Secondary | ICD-10-CM

## 2020-06-01 DIAGNOSIS — E7800 Pure hypercholesterolemia, unspecified: Secondary | ICD-10-CM | POA: Diagnosis present

## 2020-06-01 DIAGNOSIS — E1151 Type 2 diabetes mellitus with diabetic peripheral angiopathy without gangrene: Secondary | ICD-10-CM | POA: Diagnosis present

## 2020-06-01 DIAGNOSIS — E8779 Other fluid overload: Secondary | ICD-10-CM | POA: Diagnosis not present

## 2020-06-01 DIAGNOSIS — E039 Hypothyroidism, unspecified: Secondary | ICD-10-CM | POA: Diagnosis not present

## 2020-06-01 DIAGNOSIS — I5023 Acute on chronic systolic (congestive) heart failure: Secondary | ICD-10-CM | POA: Diagnosis not present

## 2020-06-01 DIAGNOSIS — J9621 Acute and chronic respiratory failure with hypoxia: Secondary | ICD-10-CM | POA: Diagnosis present

## 2020-06-01 DIAGNOSIS — I110 Hypertensive heart disease with heart failure: Secondary | ICD-10-CM | POA: Diagnosis not present

## 2020-06-01 DIAGNOSIS — Z951 Presence of aortocoronary bypass graft: Secondary | ICD-10-CM | POA: Diagnosis not present

## 2020-06-01 DIAGNOSIS — D638 Anemia in other chronic diseases classified elsewhere: Secondary | ICD-10-CM | POA: Diagnosis not present

## 2020-06-01 DIAGNOSIS — Z79890 Hormone replacement therapy (postmenopausal): Secondary | ICD-10-CM | POA: Diagnosis not present

## 2020-06-01 DIAGNOSIS — R54 Age-related physical debility: Secondary | ICD-10-CM | POA: Diagnosis present

## 2020-06-01 DIAGNOSIS — I350 Nonrheumatic aortic (valve) stenosis: Secondary | ICD-10-CM

## 2020-06-01 DIAGNOSIS — Z79899 Other long term (current) drug therapy: Secondary | ICD-10-CM | POA: Diagnosis not present

## 2020-06-01 DIAGNOSIS — Z7189 Other specified counseling: Secondary | ICD-10-CM | POA: Diagnosis not present

## 2020-06-01 DIAGNOSIS — Z66 Do not resuscitate: Secondary | ICD-10-CM | POA: Diagnosis present

## 2020-06-01 DIAGNOSIS — E785 Hyperlipidemia, unspecified: Secondary | ICD-10-CM | POA: Diagnosis present

## 2020-06-01 DIAGNOSIS — E1122 Type 2 diabetes mellitus with diabetic chronic kidney disease: Secondary | ICD-10-CM | POA: Diagnosis present

## 2020-06-01 DIAGNOSIS — I248 Other forms of acute ischemic heart disease: Secondary | ICD-10-CM | POA: Diagnosis present

## 2020-06-01 DIAGNOSIS — Z515 Encounter for palliative care: Secondary | ICD-10-CM | POA: Diagnosis not present

## 2020-06-01 DIAGNOSIS — I441 Atrioventricular block, second degree: Secondary | ICD-10-CM | POA: Diagnosis present

## 2020-06-01 DIAGNOSIS — R0989 Other specified symptoms and signs involving the circulatory and respiratory systems: Secondary | ICD-10-CM | POA: Diagnosis present

## 2020-06-01 DIAGNOSIS — J690 Pneumonitis due to inhalation of food and vomit: Secondary | ICD-10-CM | POA: Diagnosis not present

## 2020-06-01 DIAGNOSIS — R627 Adult failure to thrive: Secondary | ICD-10-CM | POA: Diagnosis present

## 2020-06-01 DIAGNOSIS — E119 Type 2 diabetes mellitus without complications: Secondary | ICD-10-CM

## 2020-06-01 DIAGNOSIS — Z981 Arthrodesis status: Secondary | ICD-10-CM

## 2020-06-01 LAB — TROPONIN I (HIGH SENSITIVITY)
Troponin I (High Sensitivity): 391 ng/L (ref <18)
Troponin I (High Sensitivity): 381 ng/L (ref <18)

## 2020-06-01 LAB — CBC WITH DIFFERENTIAL/PLATELET
Abs Immature Granulocytes: 0.02 10*3/uL (ref 0.00–0.07)
Basophils Absolute: 0 10*3/uL (ref 0.0–0.1)
Basophils Relative: 1 %
Eosinophils Absolute: 0.1 10*3/uL (ref 0.0–0.5)
Eosinophils Relative: 1 %
HCT: 34 % — ABNORMAL LOW (ref 39.0–52.0)
Hemoglobin: 11 g/dL — ABNORMAL LOW (ref 13.0–17.0)
Immature Granulocytes: 0 %
Lymphocytes Relative: 17 %
Lymphs Abs: 1 10*3/uL (ref 0.7–4.0)
MCH: 28.4 pg (ref 26.0–34.0)
MCHC: 32.4 g/dL (ref 30.0–36.0)
MCV: 87.9 fL (ref 80.0–100.0)
Monocytes Absolute: 0.8 10*3/uL (ref 0.1–1.0)
Monocytes Relative: 14 %
Neutro Abs: 3.8 10*3/uL (ref 1.7–7.7)
Neutrophils Relative %: 67 %
Platelets: 226 10*3/uL (ref 150–400)
RBC: 3.87 MIL/uL — ABNORMAL LOW (ref 4.22–5.81)
RDW: 17 % — ABNORMAL HIGH (ref 11.5–15.5)
WBC: 5.7 10*3/uL (ref 4.0–10.5)
nRBC: 0 % (ref 0.0–0.2)

## 2020-06-01 LAB — COMPREHENSIVE METABOLIC PANEL
ALT: 18 U/L (ref 0–44)
AST: 19 U/L (ref 15–41)
Albumin: 3.7 g/dL (ref 3.5–5.0)
Alkaline Phosphatase: 77 U/L (ref 38–126)
Anion gap: 14 (ref 5–15)
BUN: 94 mg/dL — ABNORMAL HIGH (ref 8–23)
CO2: 20 mmol/L — ABNORMAL LOW (ref 22–32)
Calcium: 9.8 mg/dL (ref 8.9–10.3)
Chloride: 100 mmol/L (ref 98–111)
Creatinine, Ser: 4.85 mg/dL — ABNORMAL HIGH (ref 0.61–1.24)
GFR, Estimated: 10 mL/min — ABNORMAL LOW (ref >60)
Glucose, Bld: 139 mg/dL — ABNORMAL HIGH (ref 70–99)
Potassium: 3.9 mmol/L (ref 3.5–5.1)
Sodium: 134 mmol/L — ABNORMAL LOW (ref 135–145)
Total Bilirubin: 0.6 mg/dL (ref 0.3–1.2)
Total Protein: 7.2 g/dL (ref 6.5–8.1)

## 2020-06-01 LAB — BRAIN NATRIURETIC PEPTIDE: B Natriuretic Peptide: >4500 pg/mL — ABNORMAL HIGH (ref 0.0–100.0)

## 2020-06-01 MED ORDER — SODIUM CHLORIDE 0.9 % IV SOLN: 250.0000 mL | INTRAVENOUS | Status: DC | PRN

## 2020-06-01 MED ORDER — POLYVINYL ALCOHOL 1.4 % OP SOLN: Freq: Every day | OPHTHALMIC | Status: DC | PRN

## 2020-06-01 MED ORDER — MECLIZINE HCL 25 MG PO TABS: 25.0000 mg | ORAL_TABLET | Freq: Every day | ORAL | Status: DC | PRN

## 2020-06-01 MED ORDER — CALCITRIOL 0.25 MCG PO CAPS: 0.2500 ug | ORAL_CAPSULE | ORAL | Status: DC

## 2020-06-01 MED ORDER — MELATONIN 3 MG PO TABS
6.0000 mg | ORAL_TABLET | Freq: Every day | ORAL | Status: DC
  Administered 2020-06-02 – 2020-06-04 (×4): 6 mg via ORAL
  Filled 2020-06-01 (×5): qty 2

## 2020-06-01 MED ORDER — CALCIUM ACETATE (PHOS BINDER) 667 MG PO CAPS
667.0000 mg | ORAL_CAPSULE | Freq: Two times a day (BID) | ORAL | Status: DC
  Administered 2020-06-02 – 2020-06-05 (×7): 667 mg via ORAL
  Filled 2020-06-01 (×7): qty 1

## 2020-06-01 MED ORDER — ASPIRIN EC 81 MG PO TBEC
81.0000 mg | DELAYED_RELEASE_TABLET | Freq: Every day | ORAL | Status: DC
  Administered 2020-06-02 – 2020-06-05 (×4): 81 mg via ORAL
  Filled 2020-06-01 (×4): qty 1

## 2020-06-01 MED ORDER — FERROUS GLUCONATE 324 (38 FE) MG PO TABS
324.0000 mg | ORAL_TABLET | Freq: Every day | ORAL | Status: DC
  Administered 2020-06-02 – 2020-06-05 (×4): 324 mg via ORAL
  Filled 2020-06-01 (×4): qty 1

## 2020-06-01 MED ORDER — FUROSEMIDE 10 MG/ML IJ SOLN
40.0000 mg | Freq: Once | INTRAMUSCULAR | Status: AC
  Administered 2020-06-01: 40 mg via INTRAVENOUS
  Filled 2020-06-01: qty 4

## 2020-06-01 MED ORDER — FUROSEMIDE 10 MG/ML IJ SOLN
40.0000 mg | Freq: Two times a day (BID) | INTRAMUSCULAR | Status: DC
  Administered 2020-06-02 – 2020-06-05 (×7): 40 mg via INTRAVENOUS
  Filled 2020-06-01 (×7): qty 4

## 2020-06-01 MED ORDER — HEPARIN SODIUM (PORCINE) 5000 UNIT/ML IJ SOLN
5000.0000 [IU] | Freq: Three times a day (TID) | INTRAMUSCULAR | Status: DC
  Administered 2020-06-01 – 2020-06-05 (×11): 5000 [IU] via SUBCUTANEOUS
  Filled 2020-06-01 (×11): qty 1

## 2020-06-01 MED ORDER — NITROGLYCERIN 0.4 MG SL SUBL: 0.4000 mg | SUBLINGUAL_TABLET | SUBLINGUAL | Status: DC | PRN

## 2020-06-01 MED ORDER — INSULIN ASPART 100 UNIT/ML ~~LOC~~ SOLN
0.0000 [IU] | Freq: Every day | SUBCUTANEOUS | Status: DC
Start: 2020-06-01 — End: 2020-06-05

## 2020-06-01 MED ORDER — RENA-VITE PO TABS
1.0000 | ORAL_TABLET | Freq: Every day | ORAL | Status: DC
  Administered 2020-06-02 – 2020-06-04 (×4): 1 via ORAL
  Filled 2020-06-01 (×5): qty 1

## 2020-06-01 MED ORDER — DIPHENOXYLATE-ATROPINE 2.5-0.025 MG PO TABS: 1.0000 | ORAL_TABLET | Freq: Four times a day (QID) | ORAL | Status: DC | PRN

## 2020-06-01 MED ORDER — MIRTAZAPINE 15 MG PO TABS
7.5000 mg | ORAL_TABLET | Freq: Every day | ORAL | Status: DC
  Administered 2020-06-02 – 2020-06-04 (×4): 7.5 mg via ORAL
  Filled 2020-06-01 (×5): qty 1

## 2020-06-01 MED ORDER — ACETAMINOPHEN 500 MG PO TABS: 1000.0000 mg | ORAL_TABLET | Freq: Four times a day (QID) | ORAL | Status: DC | PRN

## 2020-06-01 MED ORDER — ONDANSETRON HCL 4 MG/2ML IJ SOLN: 4.0000 mg | Freq: Four times a day (QID) | INTRAMUSCULAR | Status: DC | PRN

## 2020-06-01 MED ORDER — DOCUSATE SODIUM 100 MG PO CAPS
100.0000 mg | ORAL_CAPSULE | Freq: Every day | ORAL | Status: DC
  Administered 2020-06-02 – 2020-06-05 (×4): 100 mg via ORAL
  Filled 2020-06-01 (×4): qty 1

## 2020-06-01 MED ORDER — INSULIN ASPART 100 UNIT/ML ~~LOC~~ SOLN
0.0000 [IU] | Freq: Three times a day (TID) | SUBCUTANEOUS | Status: DC
  Administered 2020-06-02 – 2020-06-05 (×4): 1 [IU] via SUBCUTANEOUS

## 2020-06-01 MED ORDER — SODIUM CHLORIDE 0.9% FLUSH
3.0000 mL | Freq: Two times a day (BID) | INTRAVENOUS | Status: DC
  Administered 2020-06-01 – 2020-06-05 (×7): 3 mL via INTRAVENOUS

## 2020-06-01 MED ORDER — ONDANSETRON HCL 4 MG PO TABS: 4.0000 mg | ORAL_TABLET | Freq: Three times a day (TID) | ORAL | Status: DC | PRN

## 2020-06-01 MED ORDER — ACETAMINOPHEN 325 MG PO TABS
650.0000 mg | ORAL_TABLET | ORAL | Status: DC | PRN
  Administered 2020-06-04: 650 mg via ORAL
  Filled 2020-06-01: qty 2

## 2020-06-01 MED ORDER — SODIUM CHLORIDE 0.9% FLUSH: 3.0000 mL | INTRAVENOUS | Status: DC | PRN

## 2020-06-01 MED ORDER — VITAMIN D (ERGOCALCIFEROL) 1.25 MG (50000 UNIT) PO CAPS
50000.0000 [IU] | ORAL_CAPSULE | ORAL | Status: DC
  Administered 2020-06-02: 50000 [IU] via ORAL
  Filled 2020-06-01 (×2): qty 1

## 2020-06-01 MED ORDER — LEVOTHYROXINE SODIUM 75 MCG PO TABS
75.0000 ug | ORAL_TABLET | Freq: Every day | ORAL | Status: DC
  Administered 2020-06-02 – 2020-06-05 (×4): 75 ug via ORAL
  Filled 2020-06-01: qty 3
  Filled 2020-06-01 (×3): qty 1

## 2020-06-01 NOTE — ED Notes (Signed)
DNR wristband placed on pt's R wrist

## 2020-06-01 NOTE — ED Triage Notes (Signed)
Pt presents to ED POV w/ daughter. Pt reports not feeling well for multiple days, increased SOB, weakness, fatigue, decrease appetite.

## 2020-06-01 NOTE — ED Notes (Signed)
RN made Dr. Maryan Rued aware of pt critical troponin of 391

## 2020-06-01 NOTE — H&P (Signed)
History and Physical   Henry [name] EXH:[id number] DOB: 12-23-1924 DOA: 06/01/2020  Referring MD/NP/PA: Dr. Maryan Rued  PCP: [name], MD   Outpatient Specialists: Dr. Sherren Mocha, cardiology, Dr. Marval Regal, nephrology  Patient coming from: Home  Chief Complaint: Shortness of breath and lower extremity swelling  HPI: Henry [name] is a 87 y.o. male with medical history significant of severe aortic stenosis, chronic kidney disease stage V: Diabetes, carotid artery disease, coronary artery disease, essential hypertension, hyperlipidemia, anemia of chronic disease who presented to the ER with worsening shortness of breath cough and hypoxia.  Patient has been having progressive leg swelling with orthopnea and PND.  He has history of coronary artery bypass grafting.  He was found to have the severe aortic stenosis but unable to get surgical intervention due to worsening renal function partly.  He has progressively gotten worse since then.  He was on higher level of Lasix and started becoming weak and fluid depleted.  Lasix dose was decreased 5 days ago to 40 mg twice daily.  Since then he has noted progressive worsening of his shortness of breath.  Also the orthopnea and dyspnea on exertion.  Patient feels better with oxygen in the ER.  He apparently was on oxygen at home before but currently back in but now appears to be hypoxic feeling better with oxygen at 2 L/min..  ED Course: Temperature is 97.8 blood pressure 150/95 pulse 92 respirate 25 oxygen sat 96% on 2 L.  White count 5.7 hemoglobin 11.1 platelet count of 226.  Sodium 134 potassium 3.9 chloride 100 CO2 20 BUN is 94 creatinine 4.85 calcium 9.8 glucose 139.  BNP is more than 4500.  Troponin III 91 on 381 which seem to be his baseline.  EKG shows sinus rhythm with sinus arrhythmia first-degree AV block.  No significant change from previous.  Review of Systems: As per HPI otherwise 10 point review of systems negative.    Past  Medical History:  Diagnosis Date  . Anemia   . CAD of autologous bypass graft   . Carotid artery disease ([place])   . Diabetes mellitus without complication ([place])   . DJD (degenerative joint disease)   . History of anemia of chronic disease   . Hypercholesteremia   . Hypertension   . NSTEMI (non-ST elevated myocardial infarction) ([place])   . PVD (peripheral vascular disease) with claudication ([place])   . Renal insufficiency   . Wears dentures   . Wears glasses     Past Surgical History:  Procedure Laterality Date  . A/V FISTULAGRAM N/A 07/15/2018   Procedure: A/V FISTULAGRAM - Left Arm;  Surgeon: Serafina Mitchell, MD;  Location: [facility];  Service: Cardiovascular;  Laterality: N/A;  . AV FISTULA PLACEMENT Left 03/20/2018   Procedure: ARTERIOVENOUS (AV) FISTULA CREATION LEFT ARM;  Surgeon: Serafina Mitchell, MD;  [facility];  Service: Vascular;  Laterality: Left;  . cataract surgery    . COLONOSCOPY     Hx: of  . CORONARY ARTERY BYPASS GRAFT  2000   by Dr. Cyndia Bent  . LEFT HEART CATH AND CORS/GRAFTS ANGIOGRAPHY N/A 03/27/2018   Procedure: LEFT HEART CATH AND CORS/GRAFTS ANGIOGRAPHY;  Surgeon: Sherren Mocha, MD;  Location: [facility];  Service: Cardiovascular;  Laterality: N/A;  . LUMBAR LAMINECTOMY/DECOMPRESSION MICRODISCECTOMY N/A 09/10/2012   Procedure: LUMBAR DECOMPRESSION,  IN SITU FUSION LUMBAR 4-5;  Surgeon: Melina Schools, MD;  Location: [facility];  Service: Orthopedics;  Laterality: N/A;  .  MULTIPLE TOOTH EXTRACTIONS    . TONSILLECTOMY    . UPPER EXTREMITY VENOGRAPHY Left 07/15/2018   Procedure: UPPER EXTREMITY VENOGRAPHY;  Surgeon: Serafina Mitchell, MD;  Location: [facility];  Service: Cardiovascular;  Laterality: Left;  UPPER ARM     reports that he quit smoking about 67 years ago. His smoking use included cigarettes. He has a 10.00 pack-year smoking history. He has never used smokeless tobacco. He reports previous alcohol use. He reports that he does not  use drugs.  No Known Allergies  Family History  Problem Relation Age of Onset  . Heart disease Mother   . Heart disease Father      Prior to Admission medications   Medication Sig Start Date End Date Taking? Authorizing Provider  acetaminophen (TYLENOL) 500 MG tablet Take 1,000 mg by mouth every 6 (six) hours as needed for moderate pain.   Yes [provider]  aspirin EC 81 MG tablet Take 81 mg by mouth daily.    Yes [provider]  b complex-vitamin c-folic acid (NEPHRO-VITE) 0.8 MG TABS tablet Take 1 tablet by mouth at bedtime.   Yes [provider]  calcitRIOL (ROCALTROL) 0.25 MCG capsule Take 0.25 mcg by mouth once a week. Monday   Yes [provider]  calcium acetate (PHOSLO) 667 MG capsule Take 667 mg by mouth 2 (two) times daily with a meal.   Yes [provider]  Darbepoetin Alfa (ARANESP) 100 MCG/0.5ML SOSY injection Inject 100 mcg into the skin every 30 (thirty) days.    Yes [provider]  diphenoxylate-atropine (LOMOTIL) 2.5-0.025 MG tablet Take 1 tablet by mouth 4 (four) times daily as needed for diarrhea or loose stools. 02/15/20  Yes [name], MD  docusate sodium (COLACE) 100 MG capsule Take 100 mg by mouth daily.   Yes [provider]  ergocalciferol (VITAMIN D2) 1.25 MG (50000 UT) capsule Take 50,000 Units by mouth once a week. 05/06/20  Yes [provider]  meclizine (ANTIVERT) 25 MG tablet Take 25 mg by mouth daily as needed for dizziness.   Yes [provider]  melatonin 3 MG TABS tablet Take 6 mg by mouth at bedtime.   Yes [provider]  mirtazapine (REMERON) 7.5 MG tablet Take 7.5 mg by mouth at bedtime. 05/30/20  Yes [provider]  ondansetron (ZOFRAN) 4 MG tablet Take 4 mg by mouth every 8 (eight) hours as needed for nausea or vomiting.   Yes [provider]  Propylene Glycol (SYSTANE COMPLETE OP) Place 1 drop into both eyes daily as needed (dry eyes).    Yes [provider]  ferrous gluconate (FERGON) 324 MG tablet Take 324 mg by mouth daily.    [provider]  furosemide (LASIX) 40 MG tablet Take 1 tablet (40 mg total) by mouth daily. 05/20/20   [name], MD  glucose blood (ACCU-CHEK AVIVA PLUS) test strip Use as instructed daily E11.9 04/08/20   Hoyt Koch, MD  Lancets (ACCU-CHEK MULTICLIX) lancets USE TWICE DAILY. 03/29/15   Noralee Space, MD  levothyroxine (SYNTHROID) 75 MCG tablet Take 1 tablet (75 mcg total) by mouth daily. 05/21/20   [name], MD  Multiple Vitamin (MULTIVITAMIN WITH MINERALS) TABS tablet Take 1 tablet by mouth daily.    [provider]  Multiple Vitamins-Minerals (PRESERVISION AREDS 2 PO) Take 1 tablet by mouth in the morning and at bedtime.    [provider]  nitroGLYCERIN (NITROSTAT) 0.4  MG SL tablet Place 1 tablet (0.4 mg total) under the tongue every 5 (five) minutes as needed for chest pain. 12/03/19   [name], MD  Propylene Glycol (SYSTANE BALANCE) 0.6 % SOLN Place 1 drop into both eyes daily as needed (dry eyes).    [provider]    Physical Exam: Vitals:   06/01/20 2030 06/01/20 2100 06/01/20 2115 06/01/20 2225  BP: (!) 159/95 (!) 154/100 (!) 150/96 (!) 145/87  Pulse: 89 86 88 81  Resp: (!) 24 (!) 25 19 (!) 21  Temp:      TempSrc:      SpO2: 98% 100% 98% 99%      Constitutional: Chronically ill looking, no distress Vitals:   06/01/20 2030 06/01/20 2100 06/01/20 2115 06/01/20 2225  BP: (!) 159/95 (!) 154/100 (!) 150/96 (!) 145/87  Pulse: 89 86 88 81  Resp: (!) 24 (!) 25 19 (!) 21  Temp:      TempSrc:      SpO2: 98% 100% 98% 99%   Eyes: PERRL, lids and conjunctivae normal ENMT: Mucous membranes are moist. Posterior pharynx clear of any exudate or lesions.Normal dentition.  Neck: normal, supple, no masses, no thyromegaly Respiratory: Decreased air entry bilaterally with diffuse crackles and rales, Normal respiratory effort. No  accessory muscle use.  Cardiovascular:  2+ pedal edema. 2+ pedal pulses. No carotid bruits.  Abdomen: no tenderness, no masses palpated. No hepatosplenomegaly. Bowel sounds positive.  Musculoskeletal: no clubbing / cyanosis. No joint deformity upper and lower extremities. Good ROM, no contractures. Normal muscle tone.  Skin: no rashes, lesions, ulcers. No induration Neurologic: CN 2-12 grossly intact. Sensation intact, DTR normal. Strength 5/5 in all 4.  Psychiatric: Normal judgment and insight. Alert and oriented x 3. Normal mood.     Labs on Admission: I have personally reviewed following labs and imaging studies  CBC: Recent Labs  Lab 06/01/20 1952  WBC 5.7  NEUTROABS 3.8  HGB 11.0*  HCT 34.0*  MCV 87.9  PLT 850   Basic Metabolic Panel: Recent Labs  Lab 05/27/20 1213 06/01/20 1952  NA 134* 134*  K 4.1 3.9  CL 98 100  CO2 24 20*  GLUCOSE 101* 139*  BUN 78* 94*  CREATININE 4.36* 4.85*  CALCIUM 9.7 9.8   GFR: Estimated Creatinine Clearance: 8.2 mL/min (A) (by C-G formula based on SCr of 4.85 mg/dL (H)). Liver Function Tests: Recent Labs  Lab 06/01/20 1952  AST 19  ALT 18  ALKPHOS 77  BILITOT 0.6  PROT 7.2  ALBUMIN 3.7   No results for input(s): LIPASE, AMYLASE in the last 168 hours. No results for input(s): AMMONIA in the last 168 hours. Coagulation Profile: No results for input(s): INR, PROTIME in the last 168 hours. Cardiac Enzymes: No results for input(s): CKTOTAL, CKMB, CKMBINDEX, TROPONINI in the last 168 hours. BNP (last 3 results) No results for input(s): PROBNP in the last 8760 hours. HbA1C: No results for input(s): HGBA1C in the last 72 hours. CBG: No results for input(s): GLUCAP in the last 168 hours. Lipid Profile: No results for input(s): CHOL, HDL, LDLCALC, TRIG, CHOLHDL, LDLDIRECT in the last 72 hours. Thyroid Function Tests: No results for input(s): TSH, T4TOTAL, FREET4, T3FREE, THYROIDAB in the last 72 hours. Anemia Panel: No results  for input(s): VITAMINB12, FOLATE, FERRITIN, TIBC, IRON, RETICCTPCT in the last 72 hours. Urine analysis:    Component Value Date/Time   COLORURINE YELLOW 02/12/2019 1215   APPEARANCEUR CLEAR 02/12/2019 1215   LABSPEC 1.015  02/12/2019 1215   PHURINE 6.5 02/12/2019 1215   GLUCOSEU NEGATIVE 02/12/2019 1215   HGBUR NEGATIVE 02/12/2019 1215   BILIRUBINUR NEGATIVE 02/12/2019 1215   KETONESUR NEGATIVE 02/12/2019 1215   PROTEINUR 100 (A) 04/10/2012 1929   UROBILINOGEN 0.2 02/12/2019 1215   NITRITE NEGATIVE 02/12/2019 1215   LEUKOCYTESUR NEGATIVE 02/12/2019 1215   Sepsis Labs: @LABRCNTIP (procalcitonin:4,lacticidven:4) )No results found for this or any previous visit (from the past 240 hour(s)).   Radiological Exams on Admission: DG Chest Port 1 View  Result Date: 06/01/2020 CLINICAL DATA:  Shortness of breath, renal failure, atrial fibrillation. EXAM: PORTABLE CHEST 1 VIEW COMPARISON:  03/04/2020 FINDINGS: Postoperative changes in the mediastinum. Cardiac enlargement. Diffuse bilateral pulmonary infiltrates, likely edema or multifocal pneumonia. Appearances are similar to prior study. No pleural effusions. No pneumothorax. Calcification of the aorta. IMPRESSION: Cardiac enlargement with diffuse bilateral pulmonary infiltrates. Electronically Signed   By: Lucienne Capers M.D.   On: 06/01/2020 21:49      Assessment/Plan Principal Problem:   Acute exacerbation of CHF (congestive heart failure) (HCC) Active Problems:   CAD (coronary artery disease)   Diabetes (HCC)   Hypothyroidism   Aortic valve stenosis, unspecified etiology   Hypertension   CKD (chronic kidney disease) stage 5, GFR less than 15 ml/min (HCC)   Acute on chronic congestive heart failure ([place])   DNR (do not resuscitate)     #1  Acute on chronic exacerbation of CHF: Patient's echo in January of this year showed EF of 30 to 35%.  Also evidence of aortic stenosis.  With recent decrease in his Lasix dose we may start by  increasing or giving IV Lasix.  He will need cardiology and nephrology consultation in the morning as I believe his chronic kidney disease is driving his CHF in addition to the cardiac status.  Patient is DNR and does not want dialysis.  We will continue with admission.  More than likely will need home O2 at discharge.  #2 chronic kidney disease stage V: Nephrology involved.  Patient still wants to talk to nephrology although he does not want to start dialysis.  Palliative care consult has been initiated in the outpatient setting.  #3 coronary artery disease: Stable.  Continue home regimen.  #4 severe aortic stenosis: Not a surgical candidate.  #5 essential hypertension: Blood pressure appears stable.  Continue home regimen  #6 hypothyroidism: Continue with levothyroxine  #7 diabetes: Initiate sliding scale insulin with home regimen.    DVT prophylaxis: Heparin Code Status: DNR Family Communication: Daughter at bedside Disposition Plan: To be determined Consults called: None but consult cardiology and nephrology in the morning Admission status: Inpatient  Severity of Illness: The appropriate patient status for this patient is INPATIENT. Inpatient status is judged to be reasonable and necessary in order to provide the required intensity of service to ensure the patient's safety. The patient's presenting symptoms, physical exam findings, and initial radiographic and laboratory data in the context of their chronic comorbidities is felt to place them at high risk for further clinical deterioration. Furthermore, it is not anticipated that the patient will be medically stable for discharge from the hospital within 2 midnights of admission. The following factors support the patient status of inpatient.   " The patient's presenting symptoms include shortness of breath and lower extremity edema. " The worrisome physical exam findings include bilateral crackles with lower extremity edema. " The  initial radiographic and laboratory data are worrisome because of stage V chronic kidney disease. " The chronic  co-morbidities include chronic kidney disease and diastolic as well as systolic CHF.   * I certify that at the point of admission it is my clinical judgment that the patient will require inpatient hospital care spanning beyond 2 midnights from the point of admission due to high intensity of service, high risk for further deterioration and high frequency of surveillance required.Barbette Merino MD [facility] Pager [phone]  If 7PM-7AM, please contact night-coverage [email or web address] Password [email or web address]  06/01/2020, 10:40 PM

## 2020-06-01 NOTE — ED Notes (Signed)
Dr. Laverta Baltimore at bedside in triage

## 2020-06-01 NOTE — ED Provider Notes (Signed)
[facility] EMERGENCY DEPARTMENT Provider Note   CSN: [id number] Arrival date & time: 06/01/20  1915     History Chief Complaint  Patient presents with  . Shortness of Breath    Henry [name] is a 87 y.o. male.  Patient is a 87 year old male with a history of diabetes, CAD status post bypass, hypertension, anemia, CHF, CKD stage V and aortic valve stenosis who is presenting today with worsening shortness of breath.  Patient reports that he has been winded for the last few months but when reviewing his doctor's notes he was seen at the end of March and at that time appeared to be fluid overloaded and had increase of his Lasix.  He then returned on 1 April and appeared possibly mildly volume depleted but had improved shortness of breath at that time but was having generalized weakness and slowness.  They decreased the Lasix 5 days ago to only 40 mg 5 times a day.  Patient reports over the last few days he has had worsening shortness of breath.  He is here with his daughter and they do not report significant change in weight or swelling in the lower extremities but he is now complaining of orthopnea and severe dyspnea on exertion even when taking a few steps.  He denies any abdominal pain but reports he is having chest tightness for several months now because he feels like he cannot get the air in.  Denies any pain currently.  He has had minimal cough and denies any fever or significant sputum production.  The history is provided by the patient.  Shortness of Breath      Past Medical History:  Diagnosis Date  . Anemia   . CAD of autologous bypass graft   . Carotid artery disease ([place])   . Diabetes mellitus without complication ([place])   . DJD (degenerative joint disease)   . History of anemia of chronic disease   . Hypercholesteremia   . Hypertension   . NSTEMI (non-ST elevated myocardial infarction) ([place])   . PVD (peripheral vascular disease) with claudication ([place])    . Renal insufficiency   . Wears dentures   . Wears glasses     Patient Active Problem List   Diagnosis Date Noted  . Debility 05/20/2020  . Systolic congestive heart failure ([place]) 03/04/2020  . Chronic hypoxemic respiratory failure ([place]) 12/03/2019  . Goals of care, counseling/discussion   . Palliative care by specialist   . Encounter for hospice care discussion   . Acute on chronic congestive heart failure ([place]) 11/09/2019  . DNR (do not resuscitate) 11/09/2019  . Mobitz type 1 second degree atrioventricular block 05/02/2018  . Chronic diastolic CHF (congestive heart failure) ([place])   . NSTEMI 02/2018 - 2/4 grafts occluded; Med Rx   . Respiratory failure with hypoxia ([place]) 03/25/2018  . Pulmonary edema 03/25/2018  . Respiratory failure, acute ([place]) 03/25/2018  . Elevated troponin 03/25/2018  . Vitamin D deficiency 01/01/2018  . CKD (chronic kidney disease) stage 5, GFR less than 15 ml/min ([facility]) 07/24/2017  . Carotid artery disease ([place])   . Hypertension   . PVD (peripheral vascular disease) with claudication ([place])   . Peripheral vascular disease with claudication ([place]) 01/22/2017  . Aortic valve stenosis, unspecified etiology 01/22/2017  . 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 Acute bronchitis 03/07/2016  . Hypothyroidism 08/31/2014  . Carotid stenosis 08/19/2012  . DJD (degenerative joint disease) 08/19/2012  . LBP (low back pain) 08/19/2012  .  Cerebral atrophy ([place]) 08/19/2012  . Dizziness 04/13/2012  . Diabetes ([place]) 04/13/2012  . Nausea and vomiting 04/10/2012  . CAD (coronary artery disease) 07/13/2008  . HYPERCHOLESTEROLEMIA 04/04/2007  . Anemia of chronic disease 04/04/2007    Past Surgical History:  Procedure Laterality Date  . A/V FISTULAGRAM N/A 07/15/2018   Procedure: A/V FISTULAGRAM - Left Arm;  Surgeon: Serafina Mitchell, MD;  Location: [facility];  Service: Cardiovascular;  Laterality: N/A;  . AV FISTULA PLACEMENT Left 03/20/2018    Procedure: ARTERIOVENOUS (AV) FISTULA CREATION LEFT ARM;  Surgeon: Serafina Mitchell, MD;  Location: [facility] OR;  Service: Vascular;  Laterality: Left;  . cataract surgery    . COLONOSCOPY     Hx: of  . CORONARY ARTERY BYPASS GRAFT  2000   by Dr. Cyndia Bent  . LEFT HEART CATH AND CORS/GRAFTS ANGIOGRAPHY N/A 03/27/2018   Procedure: LEFT HEART CATH AND CORS/GRAFTS ANGIOGRAPHY;  Surgeon: Sherren Mocha, MD;  Location: [facility];  Service: Cardiovascular;  Laterality: N/A;  . LUMBAR LAMINECTOMY/DECOMPRESSION MICRODISCECTOMY N/A 09/10/2012   Procedure: LUMBAR DECOMPRESSION,  IN SITU FUSION LUMBAR 4-5;  Surgeon: Melina Schools, MD;  Location: [facility];  Service: Orthopedics;  Laterality: N/A;  . MULTIPLE TOOTH EXTRACTIONS    . TONSILLECTOMY    . UPPER EXTREMITY VENOGRAPHY Left 07/15/2018   Procedure: UPPER EXTREMITY VENOGRAPHY;  Surgeon: Serafina Mitchell, MD;  Location: [facility];  Service: Cardiovascular;  Laterality: Left;  UPPER ARM       Family History  Problem Relation Age of Onset  . Heart disease Mother   . Heart disease Father     Social History   Tobacco Use  . Smoking status: Former Smoker    Packs/day: 1.00    Years: 10.00    Pack years: 10.00    Types: Cigarettes    Quit date: 02/26/1953    Years since quitting: 67.3  . Smokeless tobacco: Never Used  Vaping Use  . Vaping Use: Never used  Substance Use Topics  . Alcohol use: Not Currently    Alcohol/week: 0.0 standard drinks  . Drug use: No    Home Medications Prior to Admission medications   Medication Sig Start Date End Date Taking? Authorizing Provider  aspirin EC 81 MG tablet Take 81 mg by mouth daily.     [provider]  b complex-vitamin c-folic acid (NEPHRO-VITE) 0.8 MG TABS tablet Take 1 tablet by mouth at bedtime.    [provider]  calcitRIOL (ROCALTROL) 0.25 MCG capsule Take 0.25 mcg by mouth once a week. Monday    [provider]  calcium acetate (PHOSLO) 667 MG capsule  Take 667 mg by mouth 2 (two) times daily with a meal.    [provider]  Darbepoetin Alfa (ARANESP) 100 MCG/0.5ML SOSY injection Inject 100 mcg into the skin every 30 (thirty) days.     [provider]  diphenoxylate-atropine (LOMOTIL) 2.5-0.025 MG tablet Take 1 tablet by mouth 4 (four) times daily as needed for diarrhea or loose stools. 02/15/20   [name], MD  ergocalciferol (VITAMIN D2) 1.25 MG (50000 UT) capsule TAKE ONE CAPSULE BY MOUTH ONCE EACH WEEK FOR VITAMIN D REPLACEMENT (50,000 UNITS = 1,250 MCG) TAKE ONCE A WEEK FOR THE NEXT 5 WEEKS TO REPLETE VIT D LEVEL 05/06/20   [provider]  ferrous gluconate (FERGON) 324 MG tablet Take 324 mg by mouth daily.    [provider]  furosemide (LASIX) 40 MG  tablet Take 1 tablet (40 mg total) by mouth daily. 05/20/20   [name], MD  glucose blood (ACCU-CHEK AVIVA PLUS) test strip Use as instructed daily E11.9 04/08/20   Hoyt Koch, MD  Lancets (ACCU-CHEK MULTICLIX) lancets USE TWICE DAILY. 03/29/15   Noralee Space, MD  levothyroxine (SYNTHROID) 75 MCG tablet Take 1 tablet (75 mcg total) by mouth daily. 05/21/20   [name], MD  meclizine (ANTIVERT) 25 MG tablet Take 25 mg by mouth 3 (three) times daily as needed for dizziness.    [provider]  Multiple Vitamin (MULTIVITAMIN WITH MINERALS) TABS tablet Take 1 tablet by mouth daily.    [provider]  Multiple Vitamins-Minerals (PRESERVISION AREDS 2 PO) Take 1 tablet by mouth in the morning and at bedtime.    [provider]  nitroGLYCERIN (NITROSTAT) 0.4 MG SL tablet Place 1 tablet (0.4 mg total) under the tongue every 5 (five) minutes as needed for chest pain. 12/03/19   [name], MD  Propylene Glycol (SYSTANE BALANCE) 0.6 % SOLN Place 1 drop into both eyes daily as needed (dry eyes).    [provider]    Allergies    Patient has no known allergies.  Review of Systems   Review of Systems   Respiratory: Positive for shortness of breath.   All other systems reviewed and are negative.   Physical Exam Updated Vital Signs BP (!) 159/95   Pulse 89   Temp 97.8 F (36.6 C) (Oral)   Resp (!) 24   SpO2 98%   Physical Exam Vitals and nursing note reviewed.  Constitutional:      General: He is not in acute distress.    Appearance: He is well-developed.  HENT:     Head: Normocephalic and atraumatic.  Eyes:     Conjunctiva/sclera: Conjunctivae normal.     Pupils: Pupils are equal, round, and reactive to light.  Cardiovascular:     Rate and Rhythm: Normal rate and regular rhythm.  Extrasystoles are present.    Heart sounds: Murmur heard.    Pulmonary:     Effort: Pulmonary effort is normal. Tachypnea present. No respiratory distress.     Breath sounds: Examination of the right-middle field reveals rales. Examination of the left-middle field reveals rales. Examination of the right-lower field reveals rales. Examination of the left-lower field reveals rales. Rales present. No wheezing.  Abdominal:     General: There is no distension.     Palpations: Abdomen is soft.     Tenderness: There is no abdominal tenderness. There is no guarding or rebound.  Musculoskeletal:        General: No tenderness. Normal range of motion.     Cervical back: Normal range of motion and neck supple.     Right lower leg: Edema present.     Left lower leg: Edema present.     Comments: 1+ pitting edema bilateral feet and ankles  Skin:    General: Skin is warm and dry.     Findings: No erythema or rash.  Neurological:     General: No focal deficit present.     Mental Status: He is alert and oriented to person, place, and time. Mental status is at baseline.  Psychiatric:        Mood and Affect: Mood normal.        Behavior: Behavior normal.        Thought Content: Thought content normal.     ED Results /  Procedures / Treatments   Labs (all labs ordered are listed, but only abnormal  results are displayed) Labs Reviewed  CBC WITH DIFFERENTIAL/PLATELET - Abnormal; Notable for the following components:      Result Value   RBC 3.87 (*)    Hemoglobin 11.0 (*)    HCT 34.0 (*)    RDW 17.0 (*)    All other components within normal limits  COMPREHENSIVE METABOLIC PANEL - Abnormal; Notable for the following components:   Sodium 134 (*)    CO2 20 (*)    Glucose, Bld 139 (*)    BUN 94 (*)    Creatinine, Ser 4.85 (*)    GFR, Estimated 10 (*)    All other components within normal limits  BRAIN NATRIURETIC PEPTIDE - Abnormal; Notable for the following components:   B Natriuretic Peptide >4,500.0 (*)    All other components within normal limits  TROPONIN I (HIGH SENSITIVITY) - Abnormal; Notable for the following components:   Troponin I (High Sensitivity) 391 (*)    All other components within normal limits  TROPONIN I (HIGH SENSITIVITY)    EKG EKG Interpretation  Date/Time:  Wednesday June 01 2020 19:44:33 EDT Ventricular Rate:  90 PR Interval:  232 QRS Duration: 160 QT Interval:  400 QTC Calculation: 489 R Axis:   15 Text Interpretation: Sinus rhythm with sinus arrhythmia with 1st degree A-V block Left bundle branch block Abnormal ECG Confirmed by Nanda Quinton [phone]) on 06/01/2020 8:25:00 PM   Radiology DG Chest Port 1 View  Result Date: 06/01/2020 CLINICAL DATA:  Shortness of breath, renal failure, atrial fibrillation. EXAM: PORTABLE CHEST 1 VIEW COMPARISON:  03/04/2020 FINDINGS: Postoperative changes in the mediastinum. Cardiac enlargement. Diffuse bilateral pulmonary infiltrates, likely edema or multifocal pneumonia. Appearances are similar to prior study. No pleural effusions. No pneumothorax. Calcification of the aorta. IMPRESSION: Cardiac enlargement with diffuse bilateral pulmonary infiltrates. Electronically Signed   By: Lucienne Capers M.D.   On: 06/01/2020 21:49    Procedures Procedures   Medications Ordered in ED Medications - No data to  display  ED Course  I have reviewed the triage vital signs and the nursing notes.  Pertinent labs & imaging results that were available during my care of the patient were reviewed by me and considered in my medical decision making (see chart for details).    MDM Rules/Calculators/A&P                          Patient presenting today with a complaint of shortness of breath.  Patient has multiple medical problems including CAD and CHF as well as stage V kidney disease.  Patient has been seeing his doctor for the shortness of breath.  He reports that shortness of breath has been going on for several months.  Saw his doctor at the end of March and had the Lasix increase.  He then went back for follow-up 5 days ago and at that time they felt that he may be slightly over diuresed and decreased his Lasix to 5 times a day which he has been doing.  He follows a low-salt diet and has not wavered from that.  He is not drinking excessive amounts of fluid.  Patient's EKG today shows a left bundle branch block however it is more pronounced ST elevation in V2 and V3 compared to prior.  Dr. Laverta Baltimore spoke with Dr. Angelena Form about these results but at this time do not feel that he was  a code STEMI.  Concern for volume overload today versus worsening renal function versus hyperkalemia.  Lower suspicion for pneumonia or acute infectious process.  10:16 PM Today patient's BNP is greater than 4500, chest x-ray with evidence of fluid overload, CBC without acute findings, CMP showing gradually worsening creatinine now 4.85 from 4.35 days ago.  Troponin elevated at 391 which seems to be patient's baseline.  Discussed with the family and the patient the findings.  He is feeling much better on 2 L of oxygen and work of breathing is significantly improved.  Will give 40 mg of IV Lasix but concern for hepatorenal syndrome also in the setting of severe aortic stenosis unamenable to treatment patient cannot have significant drop in his  blood pressure.  He is not a candidate for TAVR.  He is currently receiving palliative care however they were unable to get him oxygen at home.  Patient was not sure if he was ready for hospice yet because he did not want to terminate his relationship with his doctors.  MDM Number of Diagnoses or Management Options   Amount and/or Complexity of Data Reviewed Clinical lab tests: ordered and reviewed Tests in the radiology section of CPT: ordered and reviewed Tests in the medicine section of CPT: ordered and reviewed Decide to obtain previous medical records or to obtain history from someone other than the patient: yes Obtain history from someone other than the patient: yes Review and summarize past medical records: yes Discuss the patient with other providers: yes Independent visualization of images, tracings, or specimens: yes  Risk of Complications, Morbidity, and/or Mortality Presenting problems: high Diagnostic procedures: high Management options: high  Patient Progress Patient progress: stable   CRITICAL CARE Performed by: Stephaie Dardis Total critical care time: 30 minutes Critical care time was exclusive of separately billable procedures and treating other patients. Critical care was necessary to treat or prevent imminent or life-threatening deterioration. Critical care was time spent personally by me on the following activities: development of treatment plan with patient and/or surrogate as well as nursing, discussions with consultants, evaluation of patient's response to treatment, examination of patient, obtaining history from patient or surrogate, ordering and performing treatments and interventions, ordering and review of laboratory studies, ordering and review of radiographic studies, pulse oximetry and re-evaluation of patient's condition.  Final Clinical Impression(s) / ED Diagnoses Final diagnoses:  Acute on chronic congestive heart failure, unspecified heart  failure type ([place])  AKI (acute kidney injury) [facility])    Rx / DC Orders ED Discharge Orders    None       Blanchie Dessert, MD 06/01/20 2308

## 2020-06-01 NOTE — ED Provider Notes (Signed)
Patient placed in Quick Look pathway, seen and evaluated   Chief Complaint: shortness of breath  HPI:   Pt has a history of pulmonary edema.  Pt reports he feels like he has fluid in his lungs  ROS: leg swelling  Physical Exam:   Gen: No distress  Neuro: Awake and Alert  Skin: Warm    Focused Exam: resp rate normal heart rrr   Initiation of care has begun. The patient has been counseled on the process, plan, and necessity for staying for the completion/evaluation, and the remainder of the medical screening examination   Sidney Ace 06/01/20 1945    Long, [facility], MD 06/01/20 2027

## 2020-06-01 NOTE — ED Notes (Signed)
RN messaged pharmacy per verification of due medications 

## 2020-06-02 ENCOUNTER — Encounter (HOSPITAL_COMMUNITY): Payer: Self-pay | Admitting: Internal Medicine

## 2020-06-02 DIAGNOSIS — Z66 Do not resuscitate: Secondary | ICD-10-CM

## 2020-06-02 DIAGNOSIS — N179 Acute kidney failure, unspecified: Secondary | ICD-10-CM | POA: Diagnosis not present

## 2020-06-02 DIAGNOSIS — J690 Pneumonitis due to inhalation of food and vomit: Secondary | ICD-10-CM | POA: Diagnosis not present

## 2020-06-02 DIAGNOSIS — Z515 Encounter for palliative care: Secondary | ICD-10-CM

## 2020-06-02 DIAGNOSIS — I350 Nonrheumatic aortic (valve) stenosis: Secondary | ICD-10-CM | POA: Diagnosis not present

## 2020-06-02 DIAGNOSIS — I509 Heart failure, unspecified: Secondary | ICD-10-CM

## 2020-06-02 DIAGNOSIS — N185 Chronic kidney disease, stage 5: Secondary | ICD-10-CM | POA: Diagnosis not present

## 2020-06-02 DIAGNOSIS — I5043 Acute on chronic combined systolic (congestive) and diastolic (congestive) heart failure: Secondary | ICD-10-CM | POA: Diagnosis not present

## 2020-06-02 DIAGNOSIS — Z7189 Other specified counseling: Secondary | ICD-10-CM

## 2020-06-02 LAB — CBG MONITORING, ED
Glucose-Capillary: 107 mg/dL — ABNORMAL HIGH (ref 70–99)
Glucose-Capillary: 115 mg/dL — ABNORMAL HIGH (ref 70–99)
Glucose-Capillary: 89 mg/dL (ref 70–99)

## 2020-06-02 LAB — CBC WITH DIFFERENTIAL/PLATELET
Abs Immature Granulocytes: 0.01 10*3/uL (ref 0.00–0.07)
Basophils Absolute: 0 10*3/uL (ref 0.0–0.1)
Basophils Relative: 1 %
Eosinophils Absolute: 0.1 10*3/uL (ref 0.0–0.5)
Eosinophils Relative: 2 %
HCT: 34.3 % — ABNORMAL LOW (ref 39.0–52.0)
Hemoglobin: 11.2 g/dL — ABNORMAL LOW (ref 13.0–17.0)
Immature Granulocytes: 0 %
Lymphocytes Relative: 19 %
Lymphs Abs: 1.1 10*3/uL (ref 0.7–4.0)
MCH: 28.7 pg (ref 26.0–34.0)
MCHC: 32.7 g/dL (ref 30.0–36.0)
MCV: 87.9 fL (ref 80.0–100.0)
Monocytes Absolute: 0.8 10*3/uL (ref 0.1–1.0)
Monocytes Relative: 14 %
Neutro Abs: 3.7 10*3/uL (ref 1.7–7.7)
Neutrophils Relative %: 64 %
Platelets: 219 10*3/uL (ref 150–400)
RBC: 3.9 MIL/uL — ABNORMAL LOW (ref 4.22–5.81)
RDW: 16.9 % — ABNORMAL HIGH (ref 11.5–15.5)
WBC: 5.8 10*3/uL (ref 4.0–10.5)
nRBC: 0 % (ref 0.0–0.2)

## 2020-06-02 LAB — BASIC METABOLIC PANEL
Anion gap: 11 (ref 5–15)
BUN: 94 mg/dL — ABNORMAL HIGH (ref 8–23)
CO2: 24 mmol/L (ref 22–32)
Calcium: 9.8 mg/dL (ref 8.9–10.3)
Chloride: 100 mmol/L (ref 98–111)
Creatinine, Ser: 4.73 mg/dL — ABNORMAL HIGH (ref 0.61–1.24)
GFR, Estimated: 11 mL/min — ABNORMAL LOW (ref >60)
Glucose, Bld: 129 mg/dL — ABNORMAL HIGH (ref 70–99)
Potassium: 4.1 mmol/L (ref 3.5–5.1)
Sodium: 135 mmol/L (ref 135–145)

## 2020-06-02 LAB — GLUCOSE, CAPILLARY
Glucose-Capillary: 140 mg/dL — ABNORMAL HIGH (ref 70–99)
Glucose-Capillary: 120 mg/dL — ABNORMAL HIGH (ref 70–99)

## 2020-06-02 LAB — CREATININE, SERUM
Creatinine, Ser: 4.86 mg/dL — ABNORMAL HIGH (ref 0.61–1.24)
GFR, Estimated: 10 mL/min — ABNORMAL LOW (ref >60)

## 2020-06-02 LAB — RESP PANEL BY RT-PCR (FLU A&B, COVID) ARPGX2
Influenza A by PCR: NEGATIVE
Influenza B by PCR: NEGATIVE
SARS Coronavirus 2 by RT PCR: NEGATIVE

## 2020-06-02 NOTE — Consult Note (Signed)
[facility] Renal Consultation Note  Requesting MD: Jonelle Sidle Indication for Consultation: advanced CKD-  Volume overload, likely uremia  HPI:  Henry [name] is a 87 y.o. male with advanced CKD followed by Dr. Marval Regal at [facility].  2 years ago, there was a plan to prepare for dialysis- but then pt had complication after AVF placement, then his wife passed away and lately the plan had been for conservative care without dialysis support.  The family was on board with that.  Apparently - pt had been on lasix 40 mg per day as an OP-  Went to his PCP with c/o volume overload so lasix was increased to 40 mg daily.  After about a week on that dose-  crt had worsened from 3.9 to 4,3 so was told to decrease lasix to 40 mg only 5 days out of the week.  Pt was having some FTT at home- SOB, decreased appetite and a "funny feeling"  Inside of his gut.  He felt best to come to the ER yesterday with these complaints. Crt was 4.8- BNP 4500 and CXR showed cardiac enlargement with diffuse pulmonary infiltrates-  Looks like edema.    He states that as soon as they put the oxygen on him he felt better. He was given a dose of lasix 40 IV yesterday and ordered 40 BID for today.  I spoke to the patient and his 2 daughters today (one by phone)  Everyone understands that we will not be doing dialysis - the mainstay of care at this time is symptom management- they are pleased that he is feeling better.  They [facility] already in place   Creat  Date/Time Value Ref Range Status  01/31/2015 02:08 PM 2.37 (H) 0.70 - 1.11 mg/dL Final   Creatinine, Ser  Date/Time Value Ref Range Status  06/02/2020 02:42 AM 4.73 (H) 0.61 - 1.24 mg/dL Final  06/01/2020 10:43 PM 4.86 (H) 0.61 - 1.24 mg/dL Final  06/01/2020 07:52 PM 4.85 (H) 0.61 - 1.24 mg/dL Final  05/27/2020 12:13 PM 4.36 (H) 0.40 - 1.50 mg/dL Final  05/20/2020 12:37 PM 3.91 (H) 0.40 - 1.50 mg/dL Final  03/25/2020 02:33 PM 2.89 (H) 0.76 - 1.27 mg/dL Final   03/09/2020 03:38 AM 3.76 (H) 0.61 - 1.24 mg/dL Final  03/08/2020 03:39 AM 3.81 (H) 0.61 - 1.24 mg/dL Final  03/07/2020 03:24 AM 3.88 (H) 0.61 - 1.24 mg/dL Final  03/06/2020 02:51 AM 3.63 (H) 0.61 - 1.24 mg/dL Final  03/05/2020 04:25 AM 3.37 (H) 0.61 - 1.24 mg/dL Final  03/04/2020 09:37 AM 3.29 (H) 0.61 - 1.24 mg/dL Final  02/03/2020 04:02 PM 3.15 (H) 0.76 - 1.27 mg/dL Final  12/03/2019 02:54 PM 3.17 (H) 0.40 - 1.50 mg/dL Final  12/01/2019 12:39 PM 3.06 (H) 0.76 - 1.27 mg/dL Final  11/13/2019 04:34 AM 3.74 (H) 0.61 - 1.24 mg/dL Final  11/12/2019 06:10 AM 3.74 (H) 0.61 - 1.24 mg/dL Final  11/11/2019 04:25 AM 3.47 (H) 0.61 - 1.24 mg/dL Final  11/10/2019 05:07 AM 3.39 (H) 0.61 - 1.24 mg/dL Final  11/09/2019 10:06 AM 3.35 (H) 0.61 - 1.24 mg/dL Final  08/13/2019 12:03 PM 3.02 (H) 0.40 - 1.50 mg/dL Final  02/12/2019 12:15 PM 2.62 (H) 0.40 - 1.50 mg/dL Final  08/16/2018 11:45 AM 2.81 (H) 0.61 - 1.24 mg/dL Final  04/08/2018 03:09 PM 2.74 (H) 0.40 - 1.50 mg/dL Final  04/02/2018 04:51 AM 3.08 (H) 0.61 - 1.24 mg/dL Final  04/01/2018 04:27 AM 3.33 (H) 0.61 -  1.24 mg/dL Final  03/31/2018 03:16 AM 3.28 (H) 0.61 - 1.24 mg/dL Final  03/30/2018 03:54 AM 3.24 (H) 0.61 - 1.24 mg/dL Final  03/29/2018 04:48 AM 3.18 (H) 0.61 - 1.24 mg/dL Final  03/28/2018 05:17 AM 3.13 (H) 0.61 - 1.24 mg/dL Final  03/27/2018 02:47 AM 3.31 (H) 0.61 - 1.24 mg/dL Final  03/26/2018 02:27 AM 2.92 (H) 0.61 - 1.24 mg/dL Final  03/25/2018 08:08 AM 3.09 (H) 0.61 - 1.24 mg/dL Final  03/20/2018 06:14 AM 2.44 (H) 0.61 - 1.24 mg/dL Final  01/01/2018 01:18 PM 2.48 (H) 0.40 - 1.50 mg/dL Final  01/22/2017 11:55 AM 2.68 (H) 0.40 - 1.50 mg/dL Final  07/30/2016 12:04 PM 2.54 (H) 0.40 - 1.50 mg/dL Final  01/30/2016 12:04 PM 2.36 (H) 0.40 - 1.50 mg/dL Final  08/16/2015 11:33 AM 2.17 (H) 0.40 - 1.50 mg/dL Final  03/23/2015 12:36 PM 1.94 (H) 0.40 - 1.50 mg/dL Final  08/31/2014 11:08 AM 2.01 (H) 0.40 - 1.50 mg/dL Final  03/03/2014 09:08 AM  1.8 (H) 0.4 - 1.5 mg/dL Final  08/26/2013 11:18 AM 1.8 (H) 0.4 - 1.5 mg/dL Final  02/11/2013 09:29 AM 1.8 (H) 0.4 - 1.5 mg/dL Final  09/10/2012 01:47 PM 1.63 (H) 0.50 - 1.35 mg/dL Final  08/19/2012 11:15 AM 1.7 (H) 0.4 - 1.5 mg/dL Final  04/11/2012 04:40 AM 1.42 (H) 0.50 - 1.35 mg/dL Final  04/10/2012 11:30 PM 1.40 (H) 0.50 - 1.35 mg/dL Final  04/10/2012 04:40 PM 1.57 (H) 0.50 - 1.35 mg/dL Final  03/03/2012 10:01 AM 1.7 (H) 0.4 - 1.5 mg/dL Final  02/22/2011 02:47 PM 1.8 (H) 0.4 - 1.5 mg/dL Final  01/03/2010 10:38 AM 2.1 (H) 0.4 - 1.5 mg/dL Final     PMHx:   Past Medical History:  Diagnosis Date  . Anemia   . CAD of autologous bypass graft   . Carotid artery disease ([place])   . Diabetes mellitus without complication ([place])   . DJD (degenerative joint disease)   . History of anemia of chronic disease   . Hypercholesteremia   . Hypertension   . NSTEMI (non-ST elevated myocardial infarction) ([place])   . PVD (peripheral vascular disease) with claudication ([place])   . Renal insufficiency   . Wears dentures   . Wears glasses     Past Surgical History:  Procedure Laterality Date  . A/V FISTULAGRAM N/A 07/15/2018   Procedure: A/V FISTULAGRAM - Left Arm;  Surgeon: Serafina Mitchell, MD;  Location: [facility];  Service: Cardiovascular;  Laterality: N/A;  . AV FISTULA PLACEMENT Left 03/20/2018   Procedure: ARTERIOVENOUS (AV) FISTULA CREATION LEFT ARM;  Surgeon: Serafina Mitchell, MD;  Location: [facility];  Service: Vascular;  Laterality: Left;  . cataract surgery    . COLONOSCOPY     Hx: of  . CORONARY ARTERY BYPASS GRAFT  2000   by Dr. Cyndia Bent  . LEFT HEART CATH AND CORS/GRAFTS ANGIOGRAPHY N/A 03/27/2018   Procedure: LEFT HEART CATH AND CORS/GRAFTS ANGIOGRAPHY;  Surgeon: Sherren Mocha, MD;  Location: [facility];  Service: Cardiovascular;  Laterality: N/A;  . LUMBAR LAMINECTOMY/DECOMPRESSION MICRODISCECTOMY N/A 09/10/2012   Procedure: LUMBAR DECOMPRESSION,  IN SITU FUSION LUMBAR  4-5;  Surgeon: Melina Schools, MD;  Location: [facility];  Service: Orthopedics;  Laterality: N/A;  . MULTIPLE TOOTH EXTRACTIONS    . TONSILLECTOMY    . UPPER EXTREMITY VENOGRAPHY Left 07/15/2018   Procedure: UPPER EXTREMITY VENOGRAPHY;  Surgeon: Serafina Mitchell, MD;  Location: [facility];  Service: Cardiovascular;  Laterality: Left;  UPPER ARM    Family Hx:  Family History  Problem Relation Age of Onset  . Heart disease Mother   . Heart disease Father     Social History:  reports that he quit smoking about 67 years ago. His smoking use included cigarettes. He has a 10.00 pack-year smoking history. He has never used smokeless tobacco. He reports previous alcohol use. He reports that he does not use drugs.  Allergies: No Known Allergies  Medications: Prior to Admission medications   Medication Sig Start Date End Date Taking? Authorizing Provider  acetaminophen (TYLENOL) 500 MG tablet Take 1,000 mg by mouth every 6 (six) hours as needed for moderate pain.   Yes [provider]  aspirin EC 81 MG tablet Take 81 mg by mouth daily.    Yes [provider]  b complex-vitamin c-folic acid (NEPHRO-VITE) 0.8 MG TABS tablet Take 1 tablet by mouth at bedtime.   Yes [provider]  calcitRIOL (ROCALTROL) 0.25 MCG capsule Take 0.25 mcg by mouth once a week. Monday   Yes [provider]  calcium acetate (PHOSLO) 667 MG capsule Take 667 mg by mouth 2 (two) times daily with a meal.   Yes [provider]  Darbepoetin Alfa (ARANESP) 100 MCG/0.5ML SOSY injection Inject 100 mcg into the skin every 30 (thirty) days.    Yes [provider]  diphenoxylate-atropine (LOMOTIL) 2.5-0.025 MG tablet Take 1 tablet by mouth 4 (four) times daily as needed for diarrhea or loose stools. 02/15/20  Yes [name], MD  docusate sodium (COLACE) 100 MG capsule Take 100 mg by mouth daily.   Yes [provider]  ergocalciferol (VITAMIN D2) 1.25 MG (50000 UT)  capsule Take 50,000 Units by mouth once a week. 05/06/20  Yes [provider]  ferrous gluconate (FERGON) 324 MG tablet Take 324 mg by mouth daily.   Yes [provider]  furosemide (LASIX) 40 MG tablet Take 1 tablet (40 mg total) by mouth daily. 05/20/20  Yes [name], MD  levothyroxine (SYNTHROID) 75 MCG tablet Take 1 tablet (75 mcg total) by mouth daily. 05/21/20  Yes [name], MD  meclizine (ANTIVERT) 25 MG tablet Take 25 mg by mouth daily as needed for dizziness.   Yes [provider]  melatonin 3 MG TABS tablet Take 6 mg by mouth at bedtime.   Yes [provider]  mirtazapine (REMERON) 7.5 MG tablet Take 7.5 mg by mouth at bedtime. 05/30/20  Yes [provider]  Multiple Vitamins-Minerals (PRESERVISION AREDS 2 PO) Take 1 tablet by mouth in the morning and at bedtime.   Yes [provider]  nitroGLYCERIN (NITROSTAT) 0.4 MG SL tablet Place 1 tablet (0.4 mg total) under the tongue every 5 (five) minutes as needed for chest pain. 12/03/19  Yes [name], MD  ondansetron (ZOFRAN) 4 MG tablet Take 4 mg by mouth every 8 (eight) hours as needed for nausea or vomiting.   Yes [provider]  Propylene Glycol (SYSTANE COMPLETE OP) Place 1 drop into both eyes daily as needed (dry eyes).   Yes [provider]  glucose blood (ACCU-CHEK AVIVA PLUS) test strip Use as instructed daily E11.9 04/08/20   Hoyt Koch, MD  Lancets (ACCU-CHEK MULTICLIX) lancets USE TWICE DAILY. 03/29/15   Noralee Space, MD    I have reviewed the patient's current medications.  Labs:  Results for orders placed or performed during the hospital encounter of 06/01/20 (from  the past 48 hour(s))  CBC with Differential/Platelet     Status: Abnormal   Collection Time: 06/01/20  7:52 PM  Result Value Ref Range   WBC 5.7 4.0 - 10.5 K/uL   RBC 3.87 (L) 4.22 - 5.81 MIL/uL   Hemoglobin 11.0 (L) 13.0 - 17.0 g/dL   HCT 34.0 (L) 39.0 - 52.0 %   MCV  87.9 80.0 - 100.0 fL   MCH 28.4 26.0 - 34.0 pg   MCHC 32.4 30.0 - 36.0 g/dL   RDW 17.0 (H) 11.5 - 15.5 %   Platelets 226 150 - 400 K/uL   nRBC 0.0 0.0 - 0.2 %   Neutrophils Relative % 67 %   Neutro Abs 3.8 1.7 - 7.7 K/uL   Lymphocytes Relative 17 %   Lymphs Abs 1.0 0.7 - 4.0 K/uL   Monocytes Relative 14 %   Monocytes Absolute 0.8 0.1 - 1.0 K/uL   Eosinophils Relative 1 %   Eosinophils Absolute 0.1 0.0 - 0.5 K/uL   Basophils Relative 1 %   Basophils Absolute 0.0 0.0 - 0.1 K/uL   Immature Granulocytes 0 %   Abs Immature Granulocytes 0.02 0.00 - 0.07 K/uL    Comment: Performed at [facility], [address], [place], [place] [address]  Comprehensive metabolic panel     Status: Abnormal   Collection Time: 06/01/20  7:52 PM  Result Value Ref Range   Sodium 134 (L) 135 - 145 mmol/L   Potassium 3.9 3.5 - 5.1 mmol/L   Chloride 100 98 - 111 mmol/L   CO2 20 (L) 22 - 32 mmol/L   Glucose, Bld 139 (H) 70 - 99 mg/dL    Comment: Glucose reference range applies only to samples taken after fasting for at least 8 hours.   BUN 94 (H) 8 - 23 mg/dL   Creatinine, Ser 4.85 (H) 0.61 - 1.24 mg/dL   Calcium 9.8 8.9 - 10.3 mg/dL   Total Protein 7.2 6.5 - 8.1 g/dL   Albumin 3.7 3.5 - 5.0 g/dL   AST 19 15 - 41 U/L   ALT 18 0 - 44 U/L   Alkaline Phosphatase 77 38 - 126 U/L   Total Bilirubin 0.6 0.3 - 1.2 mg/dL   GFR, Estimated 10 (L) >60 mL/min    Comment: (NOTE) Calculated using the CKD-EPI Creatinine Equation (2021)    Anion gap 14 5 - 15    Comment: Performed at [facility] [address] [place], [place] [address]  Troponin I (High Sensitivity)     Status: Abnormal   Collection Time: 06/01/20  7:52 PM  Result Value Ref Range   Troponin I (High Sensitivity) 391 (HH) <18 ng/L    Comment: CRITICAL RESULT CALLED TO, READ BACK BY AND VERIFIED WITH: Lorenz Coaster [facility] 06/01/20 2102 WAYK Performed at [facility], [place] [address], [place], [place] [address]   Brain natriuretic peptide      Status: Abnormal   Collection Time: 06/01/20  7:53 PM  Result Value Ref Range   B Natriuretic Peptide >4,500.0 (H) 0.0 - 100.0 pg/mL    Comment: Performed at [facility] [address], [place], [place] [address]  Troponin I (High Sensitivity)     Status: Abnormal   Collection Time: 06/01/20  9:46 PM  Result Value Ref Range   Troponin I (High Sensitivity) 381 (HH) <18 ng/L    Comment: CRITICAL VALUE NOTED.  VALUE IS CONSISTENT WITH PREVIOUSLY REPORTED AND CALLED  VALUE. (NOTE) Elevated high sensitivity troponin I (hsTnI) values and significant  changes across serial measurements may suggest ACS but many other  chronic and acute conditions are known to elevate hsTnI results.  Refer to the Links section for chest pain algorithms and additional  guidance. Performed at [facility], [place] [address] [place], [place] [address]   Creatinine, serum     Status: Abnormal   Collection Time: 06/01/20 10:43 PM  Result Value Ref Range   Creatinine, Ser 4.86 (H) 0.61 - 1.24 mg/dL   GFR, Estimated 10 (L) >60 mL/min    Comment: (NOTE) Calculated using the CKD-EPI Creatinine Equation (2021) Performed at [facility] [address] [place], [place] [address]   CBG monitoring, ED     Status: Abnormal   Collection Time: 06/02/20 12:48 AM  Result Value Ref Range   Glucose-Capillary 107 (H) 70 - 99 mg/dL    Comment: Glucose reference range applies only to samples taken after fasting for at least 8 hours.  Basic metabolic panel     Status: Abnormal   Collection Time: 06/02/20  2:42 AM  Result Value Ref Range   Sodium 135 135 - 145 mmol/L   Potassium 4.1 3.5 - 5.1 mmol/L   Chloride 100 98 - 111 mmol/L   CO2 24 22 - 32 mmol/L   Glucose, Bld 129 (H) 70 - 99 mg/dL    Comment: Glucose reference range applies only to samples taken after fasting for at least 8 hours.   BUN 94 (H) 8 - 23 mg/dL   Creatinine, Ser 4.73 (H) 0.61 - 1.24 mg/dL   Calcium 9.8 8.9 - 10.3 mg/dL   GFR,  Estimated 11 (L) >60 mL/min    Comment: (NOTE) Calculated using the CKD-EPI Creatinine Equation (2021)    Anion gap 11 5 - 15    Comment: Performed at [facility] [address], [place], [place] [address]  CBC WITH DIFFERENTIAL     Status: Abnormal   Collection Time: 06/02/20  2:42 AM  Result Value Ref Range   WBC 5.8 4.0 - 10.5 K/uL   RBC 3.90 (L) 4.22 - 5.81 MIL/uL   Hemoglobin 11.2 (L) 13.0 - 17.0 g/dL   HCT 34.3 (L) 39.0 - 52.0 %   MCV 87.9 80.0 - 100.0 fL   MCH 28.7 26.0 - 34.0 pg   MCHC 32.7 30.0 - 36.0 g/dL   RDW 16.9 (H) 11.5 - 15.5 %   Platelets 219 150 - 400 K/uL   nRBC 0.0 0.0 - 0.2 %   Neutrophils Relative % 64 %   Neutro Abs 3.7 1.7 - 7.7 K/uL   Lymphocytes Relative 19 %   Lymphs Abs 1.1 0.7 - 4.0 K/uL   Monocytes Relative 14 %   Monocytes Absolute 0.8 0.1 - 1.0 K/uL   Eosinophils Relative 2 %   Eosinophils Absolute 0.1 0.0 - 0.5 K/uL   Basophils Relative 1 %   Basophils Absolute 0.0 0.0 - 0.1 K/uL   Immature Granulocytes 0 %   Abs Immature Granulocytes 0.01 0.00 - 0.07 K/uL    Comment: Performed at [facility], [address], [place], [place] [address]  Resp Panel by RT-PCR (Flu A&B, Covid) Nasopharyngeal Swab     Status: None   Collection Time: 06/02/20  3:09 AM   Specimen: Nasopharyngeal Swab; Nasopharyngeal(NP) swabs in vial transport medium  Result Value Ref Range   SARS Coronavirus 2 by RT PCR NEGATIVE NEGATIVE  Comment: (NOTE) SARS-CoV-2 target nucleic acids are NOT DETECTED.  The SARS-CoV-2 RNA is generally detectable in upper respiratory specimens during the acute phase of infection. The lowest concentration of SARS-CoV-2 viral copies this assay can detect is 138 copies/mL. A negative result does not preclude SARS-Cov-2 infection and should not be used as the sole basis for treatment or other patient management decisions. A negative result may occur with  improper specimen collection/handling, submission of specimen other than  nasopharyngeal swab, presence of viral mutation(s) within the areas targeted by this assay, and inadequate number of viral copies(<138 copies/mL). A negative result must be combined with clinical observations, patient history, and epidemiological information. The expected result is Negative.  Fact Sheet for Patients:  EntrepreneurPulse.com.au  Fact Sheet for Healthcare Providers:  IncredibleEmployment.be  This test is no t yet approved or cleared by the [place] FDA and  has been authorized for detection and/or diagnosis of SARS-CoV-2 by FDA under an Emergency Use Authorization (EUA). This EUA will remain  in effect (meaning this test can be used) for the duration of the COVID-19 declaration under Section 564(b)(1) of the Act, 21 U.S.C.section 360bbb-3(b)(1), unless the authorization is terminated  or revoked sooner.       Influenza A by PCR NEGATIVE NEGATIVE   Influenza B by PCR NEGATIVE NEGATIVE    Comment: (NOTE) The Xpert Xpress SARS-CoV-2/FLU/RSV plus assay is intended as an aid in the diagnosis of influenza from Nasopharyngeal swab specimens and should not be used as a sole basis for treatment. Nasal washings and aspirates are unacceptable for Xpert Xpress SARS-CoV-2/FLU/RSV testing.  Fact Sheet for Patients: EntrepreneurPulse.com.au  Fact Sheet for Healthcare Providers: IncredibleEmployment.be  This test is not yet approved or cleared by the [place] FDA and has been authorized for detection and/or diagnosis of SARS-CoV-2 by FDA under an Emergency Use Authorization (EUA). This EUA will remain in effect (meaning this test can be used) for the duration of the COVID-19 declaration under Section 564(b)(1) of the Act, 21 U.S.C. section 360bbb-3(b)(1), unless the authorization is terminated or revoked.  Performed at [facility], [place] [address], [place], [place] [address]   CBG  monitoring, ED     Status: None   Collection Time: 06/02/20  8:01 AM  Result Value Ref Range   Glucose-Capillary 89 70 - 99 mg/dL    Comment: Glucose reference range applies only to samples taken after fasting for at least 8 hours.  CBG monitoring, ED     Status: Abnormal   Collection Time: 06/02/20 11:49 AM  Result Value Ref Range   Glucose-Capillary 115 (H) 70 - 99 mg/dL    Comment: Glucose reference range applies only to samples taken after fasting for at least 8 hours.   Comment 1 Notify RN    Comment 2 Document in Chart      ROS:  Pertinent items noted in HPI and remainder of comprehensive ROS otherwise negative.  Physical Exam: Vitals:   06/02/20 1424 06/02/20 1429  BP: (!) 151/86 (!) 151/86  Pulse: 81 81  Resp: 16 18  Temp: 97.6 F (36.4 C) (!) 97.4 F (36.3 C)  SpO2: 95% 95%     General: elderly , pleasant WM, NAD HEENT: PERRLA, EOMI- mucous membranes moist Neck: positive for JVD Heart: irreg Lungs: dec BS at bases Abdomen: soft, non tender Extremities: 1+ edema -  Not extreme Skin: senile purpura-  Warm and dry Neuro: alert, non focal   Assessment/Plan: 87 year old WM with advanced  CKD presenting with volume overload 1.Renal- advanced CKD that is giving him uremia and volume overload.  There will not be reversibility here and renal failure is likely to be his cause of demise.  I explained the natural progress of passing away from renal failure.  The key will be to symptom manage as we can.  Because he feels so much better- his life expectancy could be months but would not think more than 3-4.  Home palliative care services are already in place.  In the interest of simplifying his meds- would stop the phoslo and rocaltrol-  Are not necessary  2. Hypertension/volume  - will be getting lasix 40 iv twice today.  Starting tomorrow would just keep him on 40 mg daily and home health can watch, also will have an appt with Dr. Loletha Grayer coming up-  Missed his last .  If pt remains  symptomatically better I would try to get him back home as soon as able.  I will assume he will be going home tomorrow on lasix 40 daily and home O2-  We will not revisit but call if you have questions    Louis Meckel 06/02/2020, 3:18 PM

## 2020-06-02 NOTE — Consult Note (Signed)
Consultation Note Date: 06/02/2020   Patient Name: Henry [name]  DOB: [birth year]  MRN: [id number]  Age / Sex: 87 y.o., male  PCP: [name], MD Referring Physician: Mendel Corning, MD  Reason for Consultation: Establishing goals of care "hx systolic CHF, EF 12-87%, severe aortic stenosis, CKD4, admitted with volume overload"  HPI/Patient Profile: 87 y.o. male  with past medical history of chronic systolic heart failure, severe aortic stenosis, CKD stage V, diabetes, coronary artery disease s/p CABG, anemia, HTN, and HLD who presented to the emergency department on 06/01/2020 with worsening shortness of breath and lower extremity swelling.  ED Course: Creatinine 4/85, BNP more than 4500, troponin 391. EKG with no acute changes. Admitted to [facility] with acute on chronic exacerbation of CHF and chronic kidney disease stage V.   Hospitalized 1/7--1/13 with acute CHF exacerbation; discharged to SNF. He was seen at his cardiologist office 05/06/20 for shortness of breath. Per cardiology note "Limited treatment options". He has known severe aortic stenosis and is not a candidate for surgical intervention.   Clinical Assessment and Goals of Care: I have reviewed medical records including EPIC notes, labs and imaging, and discussed case with cardiology (Dr. Acie Fredrickson and Suanne Marker PA)  I met at bedside with patient/[name] and daughter/[name] to discuss diagnosis, prognosis, GOC, EOL wishes, disposition, and options. Daughter [name] is present on speaker phone. [name] appears acutely/chornically ill and frail.   Ahan is known to PMT - he was followed by our service during his hospitalization in September 2021. He is also followed by outpatient palliative services with [facility]. I reviewed Palliative Medicine as specialized medical care for people living with serious illness. It focuses on providing relief from the symptoms  and stress of a serious illness.   We discussed a brief life reviewt. Trip is originally from [place], [place]. He served in [employer] and later worked in Press [employer] for many years. He was a Doctor, hospital and traveled frequently for work. He and his wife had 5 children - son lives in [place], daughter/[name] lives in [place], daughter/[name] lives in [place], and daughters [name] and [name] lives in [place]. He shares that his wife recently passed away in 03-29-22 - he states "I miss her so much".   [name] lives alone in his home. As far as functional status, he has been ambulatory and independent with ADLs. He has a walker and a chair lift if needed. Daughter/[name] visits every day. He also has a care aid that comes 4 days per week to help prepare his meals and do laundry.   We discussed his current illness and what it means in the larger context of his ongoing co-morbidities.  Natural disease trajectory of heart failure and kidney disease was discussed. [name] verbalizes understanding the challenge with managing these conditions, as the diuretics used to manage heart failure in turn worsen kidney function. He understands that he is not a good candidate for dialysis. Discussed that cardiology does not have additional interventions to offer.  I attempted to elicit values and goals of care important to the patient. His goal is to return home. He also wants to continue using supplemental oxygen - he reports this has helped him feel "much better". He reports better sleeping and improved appetite since wearing oxygen. He wants to keep living his life as normal as possible.   [name] does verbalize that his time is likely limited. He feels that in the next month, he will likely "go to heaven" and states he is at peace with this. The difference between full scope medical intervention and comfort care was considered in light of the patient's goals of care. Discussed that his goals seem to align with a  more comfort based path of care.   Provided education and counseling at length on the philosophy and benefits of hospice care. Discussed that it offers a holistic approach to care in the setting of end-stage illness/disease, and is about supporting the patient where they with the goal of comfort rather than prolonging life. Discussed that the focus is on quality of life to help a patient feel as good as possible for as long as possible. Discussed they could support his goal of remaining at home and help manage his symptoms (mainly shortness of breath). [name] is concerned that he would have to "give up" his physicians - I provided reassurance that he can continue seeing his regular physicians. [name] expresses appreciation for the information, but states he is not ready for hospice at this time.   Questions and concerns were addressed.  The patient and family were given PMT contact info and encouraged to call with questions or concerns.    Primary decision maker: Patient, with support from his children.     SUMMARY OF RECOMMENDATIONS    DNR/DNI as previously documented  Continue supportive medical care  Goal of care is to return home and continue using oxygen for symptom relief  Patient kindly declines hospice care at this time  Continue outpatient palliative at discharge  PMT will continue to follow  Code Status/Advance Care Planning:  DNR  Symptom Management:   Per primary team  no acute needs at this time  Palliative Prophylaxis:   Turn Reposition  Additional Recommendations (Limitations, Scope, Preferences):  No Artificial Feeding and No Hemodialysis  Psycho-social/Spiritual:   Created space and opportunity for patient and family to express thoughts and feelings regarding patient's current medical situation.   Emotional support provided   Prognosis:   < 6 months  Discharge Planning: Goal is home, he is already followed by outpatient palliative with [facility]      Primary Diagnoses: Present on Admission: . CAD (coronary artery disease) . CKD (chronic kidney disease) stage 5, GFR less than 15 ml/min (HCC) . DNR (do not resuscitate) . Hypertension . Hypothyroidism   I have reviewed the medical record, interviewed the patient and family, and examined the patient. The following aspects are pertinent.  Past Medical History:  Diagnosis Date  . Anemia   . CAD of autologous bypass graft   . Carotid artery disease ([place])   . Diabetes mellitus without complication ([place])   . DJD (degenerative joint disease)   . History of anemia of chronic disease   . Hypercholesteremia   . Hypertension   . NSTEMI (non-ST elevated myocardial infarction) ([place])   . PVD (peripheral vascular disease) with claudication ([place])   . Renal insufficiency   . Wears dentures   . Wears glasses     Family History  Problem Relation  Age of Onset  . Heart disease Mother   . Heart disease Father    Scheduled Meds: . aspirin EC  81 mg Oral Daily  . [START ON 06/06/2020] calcitRIOL  0.25 mcg Oral Q Mon  . calcium acetate  667 mg Oral BID WC  . docusate sodium  100 mg Oral Daily  . ferrous gluconate  324 mg Oral Daily  . furosemide  40 mg Intravenous BID  . heparin  5,000 Units Subcutaneous Q8H  . insulin aspart  0-5 Units Subcutaneous QHS  . insulin aspart  0-9 Units Subcutaneous TID WC  . levothyroxine  75 mcg Oral Daily  . melatonin  6 mg Oral QHS  . mirtazapine  7.5 mg Oral QHS  . multivitamin  1 tablet Oral QHS  . sodium chloride flush  3 mL Intravenous Q12H  . Vitamin D (Ergocalciferol)  50,000 Units Oral Q Thu   Continuous Infusions: . sodium chloride     PRN Meds:.sodium chloride, acetaminophen, diphenoxylate-atropine, meclizine, nitroGLYCERIN, ondansetron (ZOFRAN) IV, ondansetron, polyvinyl alcohol, sodium chloride flush Medications Prior to Admission:  Prior to Admission medications   Medication Sig Start Date End Date Taking? Authorizing Provider   acetaminophen (TYLENOL) 500 MG tablet Take 1,000 mg by mouth every 6 (six) hours as needed for moderate pain.   Yes [provider]  aspirin EC 81 MG tablet Take 81 mg by mouth daily.    Yes [provider]  b complex-vitamin c-folic acid (NEPHRO-VITE) 0.8 MG TABS tablet Take 1 tablet by mouth at bedtime.   Yes [provider]  calcitRIOL (ROCALTROL) 0.25 MCG capsule Take 0.25 mcg by mouth once a week. Monday   Yes [provider]  calcium acetate (PHOSLO) 667 MG capsule Take 667 mg by mouth 2 (two) times daily with a meal.   Yes [provider]  Darbepoetin Alfa (ARANESP) 100 MCG/0.5ML SOSY injection Inject 100 mcg into the skin every 30 (thirty) days.    Yes [provider]  diphenoxylate-atropine (LOMOTIL) 2.5-0.025 MG tablet Take 1 tablet by mouth 4 (four) times daily as needed for diarrhea or loose stools. 02/15/20  Yes [name], MD  docusate sodium (COLACE) 100 MG capsule Take 100 mg by mouth daily.   Yes [provider]  ergocalciferol (VITAMIN D2) 1.25 MG (50000 UT) capsule Take 50,000 Units by mouth once a week. 05/06/20  Yes [provider]  ferrous gluconate (FERGON) 324 MG tablet Take 324 mg by mouth daily.   Yes [provider]  furosemide (LASIX) 40 MG tablet Take 1 tablet (40 mg total) by mouth daily. 05/20/20  Yes [name], MD  levothyroxine (SYNTHROID) 75 MCG tablet Take 1 tablet (75 mcg total) by mouth daily. 05/21/20  Yes [name], MD  meclizine (ANTIVERT) 25 MG tablet Take 25 mg by mouth daily as needed for dizziness.   Yes [provider]  melatonin 3 MG TABS tablet Take 6 mg by mouth at bedtime.   Yes [provider]  mirtazapine (REMERON) 7.5 MG tablet Take 7.5 mg by mouth at bedtime. 05/30/20  Yes [provider]  Multiple Vitamins-Minerals (PRESERVISION AREDS 2 PO) Take 1 tablet by mouth in the morning and at bedtime.   Yes [provider]   nitroGLYCERIN (NITROSTAT) 0.4 MG SL tablet Place 1 tablet (0.4 mg total) under the tongue every 5 (five) minutes as needed for chest pain. 12/03/19  Yes [name], MD  ondansetron (ZOFRAN) 4 MG tablet Take  4 mg by mouth every 8 (eight) hours as needed for nausea or vomiting.   Yes [provider]  Propylene Glycol (SYSTANE COMPLETE OP) Place 1 drop into both eyes daily as needed (dry eyes).   Yes [provider]  glucose blood (ACCU-CHEK AVIVA PLUS) test strip Use as instructed daily E11.9 04/08/20   Hoyt Koch, MD  Lancets (ACCU-CHEK MULTICLIX) lancets USE TWICE DAILY. 03/29/15   Noralee Space, MD   No Known Allergies Review of Systems  Respiratory: Positive for shortness of breath.   Neurological: Positive for weakness.    Physical Exam Vitals reviewed.  Constitutional:      General: He is not in acute distress.    Appearance: He is ill-appearing.  Cardiovascular:     Rate and Rhythm: Normal rate.  Pulmonary:     Effort: Pulmonary effort is normal.  Neurological:     Mental Status: He is alert and oriented to person, place, and time.     Motor: Weakness present.     Vital Signs: BP (!) 155/84   Pulse 75   Temp 97.9 F (36.6 C) (Axillary)   Resp 19   SpO2 100%  Pain Scale: 0-10   Pain Score: 0-No pain   SpO2: SpO2: 100 % O2 Device:SpO2: 100 % O2 Flow Rate: .   IO: Intake/output summary: No intake or output data in the 24 hours ending 06/02/20 1159    Palliative Assessment/Data: PPS 40%     Time In: 1620 Time Out: 1733 Time Total: 73 minutes Greater than 50%  of this time was spent counseling and coordinating care related to the above assessment and plan.  Signed by: Lavena Bullion, NP   Please contact Palliative Medicine Team phone at [phone] for questions and concerns.  For individual provider: See Shea Evans

## 2020-06-02 NOTE — Plan of Care (Signed)
  Problem: Education: Goal: Knowledge of General Education information will improve Description: Including pain rating scale, medication(s)/side effects and non-pharmacologic comfort measures Outcome: Progressing   Problem: Health Behavior/Discharge Planning: Goal: Ability to manage health-related needs will improve Outcome: Progressing   Problem: Clinical Measurements: Goal: Ability to maintain clinical measurements within normal limits will improve Outcome: Progressing Goal: Will remain free from infection Outcome: Progressing Goal: Diagnostic test results will improve Outcome: Progressing Goal: Respiratory complications will improve Outcome: Progressing Goal: Cardiovascular complication will be avoided Outcome: Progressing   Problem: Activity: Goal: Risk for activity intolerance will decrease Outcome: Progressing   Problem: Nutrition: Goal: Adequate nutrition will be maintained Outcome: Progressing   Problem: Coping: Goal: Level of anxiety will decrease Outcome: Progressing   Problem: Elimination: Goal: Will not experience complications related to bowel motility Outcome: Progressing Goal: Will not experience complications related to urinary retention Outcome: Progressing   Problem: Pain Managment: Goal: General experience of comfort will improve Outcome: Progressing   Problem: Safety: Goal: Ability to remain free from injury will improve Outcome: Progressing   Problem: Skin Integrity: Goal: Risk for impaired skin integrity will decrease Outcome: Progressing   Problem: Education: Goal: Ability to demonstrate management of disease process will improve Outcome: Progressing Goal: Ability to verbalize understanding of medication therapies will improve Outcome: Progressing Goal: Individualized Educational Video(s) Outcome: Progressing   Problem: Activity: Goal: Capacity to carry out activities will improve Outcome: Progressing   Problem: Cardiac: Goal:  Ability to achieve and maintain adequate cardiopulmonary perfusion will improve Outcome: Progressing  Discussed bullet points as aforementioned with patient and daughter.  Comprehension of information ascertained via use of "teach-back" method of communication.  Education to be ongoing and reinforced.

## 2020-06-02 NOTE — Consult Note (Addendum)
Cardiology Consultation:   Patient ID: Henry [name] MRN: [id number]; DOB: [birth year]  Admit date: 06/01/2020 Date of Consult: 06/02/2020  PCP:  [name], [place]  Cardiologist:  Sherren Mocha, MD 05/04/2020 Advanced Practice Provider:  Liliane Shi, PA-C Electrophysiologist:  None    Patient Profile:    Henry [name] is a 87 y.o. male with a hx of Coronary artery disease  ? S/p CABG in 2000 ? S/p NSTEMI1/2020in setting of acute diastolic CHF + LZJ>>6/7 grafts patent (chronic occlusions)>>Med Rx  Aortic stenosis ? Severe (Echocardiogram 9/21): mean 18 mmHg, DI 0.26 - low flow low gradient) ? Echocardiogram 1/22: Mean 17 mmHg, DI 0.24, V-max 268 m/s ? Not a candidate for TAVR ? Risk of progression to ESRD outweighed benefit of treating AS >> palliative care  Heart failure with reduced ejection fraction ? Echocardiogram 1/22: EF 30-35 ? Echocardiogram 9/21: EF 35-40 ? Echocardiogram 4/21: EF 50-55 ? Echocardiogram 02/2018: EF 50-55  Peripheral arterial disease - med Rx   Chronic kidney disease, stage V ? AVF failed; no dialysis access ? Dr. 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 During AVF placement in 02/2018 >>beta-blockerDC'd   Orthostatic intolerance  DNR  He is being seen today for the evaluation of CHF at the request of Dr Jonelle Sidle.  History of Present Illness:   Henry [name] was seen by Dr Burt Knack on 03/09. It was noted that they are moving toward a palliative approach, considering his multiple medical conditions.  Wt 136 lbs, murmur from AS is harsh, but he is not TAVR candidate. Medications being minimized to avoid hypotension. Hospice brought up, pt not ready for this. Imdur and Lipitor stopped.   Pt weighs himself daily, his weight had not increased, in fact had decreased. However, he was getting more SOB. The SOB progressed to where he was SOB  at rest. Even though he has been SOB at rest for a month, his oxygen levels were not low enough to get insurance to pay for home O2.  Finally, the SOB got bad enough that he came in. He was lethargic and could not speak much, po intake was down.  Once he was put on oxygen, his symptoms improved. He was started on IV Lasix 40 mg bid, 2 doses so far. He has urinated several times since the am Lasix dose. Was not on Lasix PTA. It had been previously stopped.   On the Lasix, his symptoms improved.   He saw Nephrology at the [facility] in [place] last, sees Dr Arty Baumgartner here.   Past Medical History:  Diagnosis Date  . Anemia   . CAD of autologous bypass graft   . Carotid artery disease ([place])   . Diabetes mellitus without complication ([place])   . DJD (degenerative joint disease)   . History of anemia of chronic disease   . Hypercholesteremia   . Hypertension   . NSTEMI (non-ST elevated myocardial infarction) ([place])   . PVD (peripheral vascular disease) with claudication ([place])   . Renal insufficiency   . Wears dentures   . Wears glasses     Past Surgical History:  Procedure Laterality Date  . A/V FISTULAGRAM N/A 07/15/2018   Procedure: A/V FISTULAGRAM - Left Arm;  Surgeon: Serafina Mitchell, MD;  Location: [facility];  Service: Cardiovascular;  Laterality: N/A;  . AV FISTULA PLACEMENT Left 03/20/2018   Procedure: ARTERIOVENOUS (  AV) FISTULA CREATION LEFT ARM;  Surgeon: Serafina Mitchell, MD;  Location: [facility] OR;  Service: Vascular;  Laterality: Left;  . cataract surgery    . COLONOSCOPY     Hx: of  . CORONARY ARTERY BYPASS GRAFT  2000   by Dr. Cyndia Bent  . LEFT HEART CATH AND CORS/GRAFTS ANGIOGRAPHY N/A 03/27/2018   Procedure: LEFT HEART CATH AND CORS/GRAFTS ANGIOGRAPHY;  Surgeon: Sherren Mocha, MD;  Location: [facility];  Service: Cardiovascular;  Laterality: N/A;  . LUMBAR LAMINECTOMY/DECOMPRESSION MICRODISCECTOMY N/A 09/10/2012   Procedure: LUMBAR DECOMPRESSION,  IN SITU FUSION LUMBAR  4-5;  Surgeon: Melina Schools, MD;  Location: [facility];  Service: Orthopedics;  Laterality: N/A;  . MULTIPLE TOOTH EXTRACTIONS    . TONSILLECTOMY    . UPPER EXTREMITY VENOGRAPHY Left 07/15/2018   Procedure: UPPER EXTREMITY VENOGRAPHY;  Surgeon: Serafina Mitchell, MD;  Location: [facility] [place] [facility];  Service: Cardiovascular;  Laterality: Left;  UPPER ARM     Home Medications:  Prior to Admission medications   Medication Sig Start Date End Date Taking? Authorizing Provider  acetaminophen (TYLENOL) 500 MG tablet Take 1,000 mg by mouth every 6 (six) hours as needed for moderate pain.   Yes [provider]  aspirin EC 81 MG tablet Take 81 mg by mouth daily.    Yes [provider]  b complex-vitamin c-folic acid (NEPHRO-VITE) 0.8 MG TABS tablet Take 1 tablet by mouth at bedtime.   Yes [provider]  calcitRIOL (ROCALTROL) 0.25 MCG capsule Take 0.25 mcg by mouth once a week. Monday   Yes [provider]  calcium acetate (PHOSLO) 667 MG capsule Take 667 mg by mouth 2 (two) times daily with a meal.   Yes [provider]  Darbepoetin Alfa (ARANESP) 100 MCG/0.5ML SOSY injection Inject 100 mcg into the skin every 30 (thirty) days.    Yes [provider]  diphenoxylate-atropine (LOMOTIL) 2.5-0.025 MG tablet Take 1 tablet by mouth 4 (four) times daily as needed for diarrhea or loose stools. 02/15/20  Yes [name], MD  docusate sodium (COLACE) 100 MG capsule Take 100 mg by mouth daily.   Yes [provider]  ergocalciferol (VITAMIN D2) 1.25 MG (50000 UT) capsule Take 50,000 Units by mouth once a week. 05/06/20  Yes [provider]  ferrous gluconate (FERGON) 324 MG tablet Take 324 mg by mouth daily.   Yes [provider]  furosemide (LASIX) 40 MG tablet Take 1 tablet (40 mg total) by mouth daily. 05/20/20  Yes [name], MD  levothyroxine (SYNTHROID) 75 MCG tablet Take 1 tablet (75 mcg total) by mouth daily. 05/21/20  Yes [name], MD  meclizine (ANTIVERT) 25 MG tablet Take 25 mg by mouth daily as needed for dizziness.   Yes [provider]  melatonin 3 MG TABS tablet Take 6 mg by mouth at bedtime.   Yes [provider]  mirtazapine (REMERON) 7.5 MG tablet Take 7.5 mg by mouth at bedtime. 05/30/20  Yes [provider]  Multiple Vitamins-Minerals (PRESERVISION AREDS 2 PO) Take 1 tablet by mouth in the morning and at bedtime.   Yes [provider]  nitroGLYCERIN (NITROSTAT) 0.4 MG SL tablet Place 1 tablet (0.4 mg total) under the tongue every 5 (five) minutes as needed for chest pain. 12/03/19  Yes [name], MD  ondansetron (ZOFRAN) 4 MG tablet Take 4 mg by mouth every 8 (eight) hours as needed for nausea or vomiting.  Yes [provider]  Propylene Glycol (SYSTANE COMPLETE OP) Place 1 drop into both eyes daily as needed (dry eyes).   Yes [provider]  glucose blood (ACCU-CHEK AVIVA PLUS) test strip Use as instructed daily E11.9 04/08/20   Hoyt Koch, MD  Lancets (ACCU-CHEK MULTICLIX) lancets USE TWICE DAILY. 03/29/15   Noralee Space, MD    Inpatient Medications: Scheduled Meds: . aspirin EC  81 mg Oral Daily  . [START ON 06/06/2020] calcitRIOL  0.25 mcg Oral Q Mon  . calcium acetate  667 mg Oral BID WC  . docusate sodium  100 mg Oral Daily  . ferrous gluconate  324 mg Oral Daily  . furosemide  40 mg Intravenous BID  . heparin  5,000 Units Subcutaneous Q8H  . insulin aspart  0-5 Units Subcutaneous QHS  . insulin aspart  0-9 Units Subcutaneous TID WC  . levothyroxine  75 mcg Oral Daily  . melatonin  6 mg Oral QHS  . mirtazapine  7.5 mg Oral QHS  . multivitamin  1 tablet Oral QHS  . sodium chloride flush  3 mL Intravenous Q12H  . Vitamin D (Ergocalciferol)  50,000 Units Oral Q Thu   Continuous Infusions: . sodium chloride     PRN Meds: sodium chloride, acetaminophen, diphenoxylate-atropine, meclizine, nitroGLYCERIN, ondansetron (ZOFRAN)  IV, ondansetron, polyvinyl alcohol, sodium chloride flush  Allergies:   No Known Allergies  Social History:   Social History   Socioeconomic History  . Marital status: Married    Spouse name: [name]  . Number of children: 5  . Years of education: Not on file  . Highest education level: Not on file  Occupational History  . Occupation: retired    Fish farm manager: RETIRED  Tobacco Use  . Smoking status: Former Smoker    Packs/day: 1.00    Years: 10.00    Pack years: 10.00    Types: Cigarettes    Quit date: 02/26/1953    Years since quitting: 67.3  . Smokeless tobacco: Never Used  Vaping Use  . Vaping Use: Never used  Substance and Sexual Activity  . Alcohol use: Not Currently    Alcohol/week: 0.0 standard drinks  . Drug use: No  . Sexual activity: Not Currently  Other Topics Concern  . Not on file  Social History Narrative  . Not on file   Social Determinants of Health   Financial Resource Strain: Not on file  Food Insecurity: Not on file  Transportation Needs: Not on file  Physical Activity: Not on file  Stress: Not on file  Social Connections: Not on file  Intimate Partner Violence: Not on file    Family History:   Family History  Problem Relation Age of Onset  . Heart disease Mother   . Heart disease Father     Family Status  Relation Name Status  . Mother  Deceased at age 73  . Father  Deceased at age 27  . Sister  Deceased at age 78  . Sister  Deceased at age 35  . MGM  Deceased  . MGF  Deceased  . PGM  Deceased  . PGF  Deceased   ROS:  Please see the history of present illness.  All other ROS reviewed and negative.     Physical Exam/Data:   Vitals:   06/02/20 1300 06/02/20 1400 06/02/20 1424 06/02/20 1429  BP: (!) 148/97 (!) 151/83 (!) 151/86 (!) 151/86  Pulse: 90 77 81 81  Resp: (!) 27 19 16  18  Temp:   97.6 F (36.4 C) (!) 97.4 F (36.3 C)  TempSrc:    Oral  SpO2: 91% 100% 95% 95%  Weight:    6.3 kg  Height:    5\' 9"  (1.753 m)     Intake/Output Summary (Last 24 hours) at 06/02/2020 1458 Last data filed at 06/02/2020 1217 Gross per 24 hour  Intake 3 ml  Output --  Net 3 ml   Last 3 Weights 06/02/2020 05/27/2020 05/20/2020  Weight (lbs) 13 lb 14.2 oz 137 lb 137 lb  Weight (kg) 6.3 kg 62.143 kg 62.143 kg     Body mass index is 2.05 kg/m.  General:  Frail, elderly male, in mild respiratory distress HEENT: normal Lymph: no adenopathy Neck: JVD 11 cm Endocrine:  No thryomegaly Vascular: No carotid bruits; 4/4 extremity pulses 2+   Cardiac:  normal S1, S2; RRR; 3/6 murmur Lungs: dense rales bases bilaterally, no wheezing, rhonchi   Abd: soft, nontender, no hepatomegaly  Ext: no edema Musculoskeletal:  No deformities, BUE and BLE strength weak but equal Skin: warm and dry  Neuro:  CNs 2-12 intact, no focal abnormalities noted Psych:  Normal affect   EKG:  The EKG was personally reviewed and demonstrates:  SR, HR 90, LBBB and 1st degree AV block seen,  Both old, PR interval 248 ms Telemetry:  Telemetry was personally reviewed and demonstrates:  SR  Relevant CV Studies: Echo 03/05/20: 1. Left ventricular ejection fraction, by estimation, is 30 to 35%. The  left ventricle has moderately decreased function. The left ventricle  demonstrates global hypokinesis. Left ventricular diastolic parameters are  consistent with Grade I diastolic dysfunction (impaired relaxation).  2. Right ventricular systolic function is normal. The right ventricular  size is normal. There is mildly elevated pulmonary artery systolic  pressure.  3. Left atrial size was moderately dilated.  4. The mitral valve is normal in structure. Mild mitral valve  regurgitation. No evidence of mitral stenosis.  5. Mean gradient is consistent with mild aortic stenosis, unchanged form  10/2019. However, dimensionless index is 0.24. This is decreased from 0.26  on 10/2019. Consistent with at least moderate aortic stenosis. Cannot  exclude low-flow,  low-gradient aortic stenosis. The aortic valve is calcified. There is moderate calcification of the aortic valve. There is moderate thickening of the aortic valve. Aortic valve regurgitation is mild. Moderate to severe aortic valve stenosis. Aortic valve area, by VTI measures 0.76 cm. Aortic valve mean gradient measures 17.0 mmHg. Aortic valve Vmax measures 2.68 m/s.  6. The inferior vena cava is normal in size with <50% respiratory  variability, suggesting right atrial pressure of 8 mmHg.   Laboratory Data:  High Sensitivity Troponin:   Recent Labs  Lab 06/01/20 1952 06/01/20 2146  TROPONINIHS 391* 381*     Chemistry Recent Labs  Lab 05/27/20 1213 06/01/20 1952 06/01/20 2243 06/02/20 0242  NA 134* 134*  --  135  K 4.1 3.9  --  4.1  CL 98 100  --  100  CO2 24 20*  --  24  GLUCOSE 101* 139*  --  129*  BUN 78* 94*  --  94*  CREATININE 4.36* 4.85* 4.86* 4.73*  CALCIUM 9.7 9.8  --  9.8  GFRNONAA  --  10* 10* 11*  ANIONGAP  --  14  --  11    Recent Labs  Lab 06/01/20 1952  PROT 7.2  ALBUMIN 3.7  AST 19  ALT 18  ALKPHOS 77  BILITOT  0.6   Hematology Recent Labs  Lab 06/01/20 1952 06/02/20 0242  WBC 5.7 5.8  RBC 3.87* 3.90*  HGB 11.0* 11.2*  HCT 34.0* 34.3*  MCV 87.9 87.9  MCH 28.4 28.7  MCHC 32.4 32.7  RDW 17.0* 16.9*  PLT 226 219   BNP Recent Labs  Lab 06/01/20 1953  BNP >4,500.0*    DDimer No results for input(s): DDIMER in the last 168 hours.  Lab Results  Component Value Date   TSH 7.92 (H) 05/20/2020   Lab Results  Component Value Date   HGBA1C 5.7 05/20/2020   Lab Results  Component Value Date   CHOL 158 08/13/2019   HDL 53.60 08/13/2019   LDLCALC 82 08/13/2019   LDLDIRECT 96.2 04/04/2007   TRIG 113.0 08/13/2019   CHOLHDL 3 08/13/2019     Radiology/Studies:  DG Chest Port 1 View  Result Date: 06/01/2020 CLINICAL DATA:  Shortness of breath, renal failure, atrial fibrillation. EXAM: PORTABLE CHEST 1 VIEW COMPARISON:  03/04/2020  FINDINGS: Postoperative changes in the mediastinum. Cardiac enlargement. Diffuse bilateral pulmonary infiltrates, likely edema or multifocal pneumonia. Appearances are similar to prior study. No pleural effusions. No pneumothorax. Calcification of the aorta. IMPRESSION: Cardiac enlargement with diffuse bilateral pulmonary infiltrates. Electronically Signed   By: Lucienne Capers M.D.   On: 06/01/2020 21:49     Assessment and Plan:   1. Acute on chronic combined CHF - Nephrology has been consulted - Palliative Care has been consulted - pt says he has urinated several times since given Lasix this am, no output recorded - with Nephrology seeing, will let them manage diuretics - recent echo, no need to repeat -  No further workup at this time - texted Dr Burt Knack to let him know Mr [name] is here.   Risk Assessment/Risk Scores:    :[id number]}    New York Heart Association (NYHA) Functional Class NYHA Class IV        For questions or updates, please contact [facility] Please consult [email or web address] for contact info under    Signed, Rosaria Ferries, PA-C  06/02/2020 2:58 PM   Attending Note:   The patient was seen and examined.  Agree with assessment and plan as noted above.  Changes made to the above note as needed.  Patient seen and independently examined with  Rosaria Ferries, PA .   We discussed all aspects of the encounter. I agree with the assessment and plan as stated above.  1.   Acute on chronic CHF :   EF 30%.  Has at least moderate AS Has CKD which complicates the issue  I agree with a reduced dose of lasix  We dont really have much to add   2.   Chronic aspiration : His x-ray is consistent with chronic aspiration.  When I asked him he does admit to choking quite a bit during and after he eats.  When I started questioning him it turns out he has already seen a speech pathologist and has tried to alter his eating.  Despite the coaching, he still tends to aspirate  every time he eats.  We discussed the fact that the only real treatment for this would be a feeding tube which I would not recommend at his age.  He is not inclined to have a feeding tube either.  4.  Chronic kidney disease : Plans per nephrology   3.  Failure to thrive: I think that Brandley's major issue is generalized failure to thrive due to multiple comorbidities  including congestive heart failure, aortic stenosis, chronic aspiration, chronic kidney disease.  He is gradually becoming weaker and weaker and is having difficulty keeping his airway open while eating.  I think that a palliative care /  hospice consultation is the appropriate next step.    I have spent a total of 40 minutes with patient reviewing hospital  notes , telemetry, EKGs, labs and examining patient as well as establishing an assessment and plan that was discussed with the patient. > 50% of time was spent in direct patient care.    Thayer Headings, Brooke Bonito., MD, [facility] 06/02/2020, 4:16 PM [address]  [place] Pager [phone]

## 2020-06-02 NOTE — Progress Notes (Signed)
[facility]                                                                              Patient Demographics  Henry [name], is a 87 y.o. male, DOB - Jun 11, 1924, KAJ:[id number]  Admit date - 06/01/2020   Admitting Physician Elwyn Reach, MD  Outpatient Primary MD for the patient is [name], MD  Outpatient specialists:   LOS - 1  days   Medical records reviewed and are as summarized below:    Chief Complaint  Patient presents with  . Shortness of Breath       Brief summary   Patient is a 87 year old male with history of severe aortic stenosis, CKD stage IV, diabetes mellitus, chronic systolic CHF, EF 30 to 03%, CAD with history of CABG, carotid artery disease, hypertension, hyperlipidemia, anemia of chronic disease presented with worsening shortness of breath, cough and hypoxia.  Patient also reported progressive leg swelling, orthopnea and PND.  Patient was on higher dose of Lasix and was becoming weak and dehydrated.  Lasix dose was decreased 5 days prior to admission to 40 mg twice daily.  Since then he started noticing progressive worsening of shortness of breath, swelling in the legs, orthopnea and PND.  Patient also felt better with O2 in the ER.  In ED, labs showed BUN 94, creatinine 4.85, BNP > 4500, troponin 391-> 381, hemoglobin 11.0 Chest x-ray showed cardiac enlargement with diffuse bilateral pulmonary infiltrates likely edema or multifocal pneumonia  Assessment & Plan    Principal Problem:   Acute on chronic combined systolic and diastolic congestive heart failure ([place]) -2D echo 03/05/2020 had shown EF of 30 to 35%, global hypokinesis, grade 1 DD, moderate to severe aortic valve stenosis  -Patient now presented with volume overload after decreasing Lasix dose, also has history of CKD 4, not on dialysis -Placed on Lasix IV 40 mg twice daily, strict I's and O's and daily weights -Given cardiac and renal status, cardiology/CHF team consulted.   Nephrology has been consulted -Patient had palliative care consult initiated in the outpatient setting, will place palliative medicine consult  Active Problems: Acute on CKD stage IV -Baseline creatinine ~3.9, has been gradually worsening, was 4.3 on 05/27/2020.  3.9 on 05/20/2020 -Presenting with creatinine of 4.8.  He follows nephrology, Dr. Marval Regal, has not been started on HD. -Nephrology consulted    CAD (coronary artery disease), hypertension -Currently no acute chest pain, elevated troponins likely due to volume overload with underlying CKD, demand ischemia -Continue aspirin, Lasix  Severe aortic stenosis -Known history of severe aortic stenosis, follows cardiology outpatient, felt not a surgical candidate due to CKD.    Diabetes mellitus ([place]) type II, appears to be diet controlled, NIDDM, with complications of CKD, CAD -Continue sliding scale insulin -Hemoglobin A1c 5.7 on 05/20/2020    Hypothyroidism -TSH 7.9 on 05/20/2020, continue Synthroid outpatient follow-up by PCP    Code Status: DNR DVT Prophylaxis:  heparin injection 5,000 Units Start: 06/01/20 2245   Level of Care: Level of care: Telemetry Cardiac Family Communication: Discussed all imaging results, lab results, explained to the patient   Disposition Plan:  Status is: Inpatient  Remains inpatient appropriate because:Inpatient level of care appropriate due to severity of illness   Dispo: The patient is from: Home              Anticipated d/c is to: Home              Patient currently is not medically stable to d/c.   Difficult to place patient No      Time Spent in minutes   35 minutes  Procedures:  None  Consultants:   Nephrology Cardiology Palliative medicine  Antimicrobials:   Anti-infectives (From admission, onward)   None         Medications  Scheduled Meds: . aspirin EC  81 mg Oral Daily  . [START ON 06/06/2020] calcitRIOL  0.25 mcg Oral Q Mon  . calcium acetate  667 mg  Oral BID WC  . docusate sodium  100 mg Oral Daily  . ferrous gluconate  324 mg Oral Daily  . furosemide  40 mg Intravenous BID  . heparin  5,000 Units Subcutaneous Q8H  . insulin aspart  0-5 Units Subcutaneous QHS  . insulin aspart  0-9 Units Subcutaneous TID WC  . levothyroxine  75 mcg Oral Daily  . melatonin  6 mg Oral QHS  . mirtazapine  7.5 mg Oral QHS  . multivitamin  1 tablet Oral QHS  . sodium chloride flush  3 mL Intravenous Q12H  . Vitamin D (Ergocalciferol)  50,000 Units Oral Q Thu   Continuous Infusions: . sodium chloride     PRN Meds:.sodium chloride, acetaminophen, diphenoxylate-atropine, meclizine, nitroGLYCERIN, ondansetron (ZOFRAN) IV, ondansetron, polyvinyl alcohol, sodium chloride flush      Subjective:   Henry [name] was seen and examined today.  States feels somewhat better since admission, has received Lasix in ED.  No chest pain.  Patient denies dizziness, abdominal pain, N/V/D/C, new weakness, numbess, tingling. No acute events overnight.    Objective:   Vitals:   06/02/20 0215 06/02/20 0400 06/02/20 0600 06/02/20 0800  BP: (!) 133/102 (!) 149/85 (!) 141/92 (!) 155/84  Pulse: 79 76 75 75  Resp: 19 19 18 19   Temp:   97.9 F (36.6 C)   TempSrc:   Axillary   SpO2: 100% 100% 100% 100%   No intake or output data in the 24 hours ending 06/02/20 1043   Wt Readings from Last 3 Encounters:  05/27/20 62.1 kg  05/20/20 62.1 kg  05/04/20 62 kg     Exam  General: Alert and oriented x 3, NAD  Cardiovascular: S1 S2 auscultated, no murmurs, RRR  Respiratory: Diminished breath sound at the bases with bibasilar Rales  Gastrointestinal: Soft, nontender, nondistended, + bowel sounds  Ext: 1-2+ pedal edema bilaterally  Neuro: no neuro deficits  Musculoskeletal: No digital cyanosis, clubbing  Skin: No rashes  Psych: Normal affect and demeanor, alert and oriented x3    Data Reviewed:  I have personally reviewed following labs and imaging  studies  Micro Results Recent Results (from the past 240 hour(s))  Resp Panel by RT-PCR (Flu A&B, Covid) Nasopharyngeal Swab     Status: None   Collection Time: 06/02/20  3:09 AM   Specimen: Nasopharyngeal Swab; Nasopharyngeal(NP) swabs in vial transport medium  Result Value Ref Range Status   SARS Coronavirus 2 by RT PCR NEGATIVE NEGATIVE Final    Comment: (NOTE) SARS-CoV-2 target nucleic acids are NOT DETECTED.  The SARS-CoV-2 RNA is generally detectable in upper respiratory specimens during the acute phase of  infection. The lowest concentration of SARS-CoV-2 viral copies this assay can detect is 138 copies/mL. A negative result does not preclude SARS-Cov-2 infection and should not be used as the sole basis for treatment or other patient management decisions. A negative result may occur with  improper specimen collection/handling, submission of specimen other than nasopharyngeal swab, presence of viral mutation(s) within the areas targeted by this assay, and inadequate number of viral copies(<138 copies/mL). A negative result must be combined with clinical observations, patient history, and epidemiological information. The expected result is Negative.  Fact Sheet for Patients:  EntrepreneurPulse.com.au  Fact Sheet for Healthcare Providers:  IncredibleEmployment.be  This test is no t yet approved or cleared by the [place] FDA and  has been authorized for detection and/or diagnosis of SARS-CoV-2 by FDA under an Emergency Use Authorization (EUA). This EUA will remain  in effect (meaning this test can be used) for the duration of the COVID-19 declaration under Section 564(b)(1) of the Act, 21 U.S.C.section 360bbb-3(b)(1), unless the authorization is terminated  or revoked sooner.       Influenza A by PCR NEGATIVE NEGATIVE Final   Influenza B by PCR NEGATIVE NEGATIVE Final    Comment: (NOTE) The Xpert Xpress SARS-CoV-2/FLU/RSV plus  assay is intended as an aid in the diagnosis of influenza from Nasopharyngeal swab specimens and should not be used as a sole basis for treatment. Nasal washings and aspirates are unacceptable for Xpert Xpress SARS-CoV-2/FLU/RSV testing.  Fact Sheet for Patients: EntrepreneurPulse.com.au  Fact Sheet for Healthcare Providers: IncredibleEmployment.be  This test is not yet approved or cleared by the [place] FDA and has been authorized for detection and/or diagnosis of SARS-CoV-2 by FDA under an Emergency Use Authorization (EUA). This EUA will remain in effect (meaning this test can be used) for the duration of the COVID-19 declaration under Section 564(b)(1) of the Act, 21 U.S.C. section 360bbb-3(b)(1), unless the authorization is terminated or revoked.  Performed at [facility], [place] [address], [place], [place] [address]     Radiology Reports DG Chest [place] 1 View  Result Date: 06/01/2020 CLINICAL DATA:  Shortness of breath, renal failure, atrial fibrillation. EXAM: PORTABLE CHEST 1 VIEW COMPARISON:  03/04/2020 FINDINGS: Postoperative changes in the mediastinum. Cardiac enlargement. Diffuse bilateral pulmonary infiltrates, likely edema or multifocal pneumonia. Appearances are similar to prior study. No pleural effusions. No pneumothorax. Calcification of the aorta. IMPRESSION: Cardiac enlargement with diffuse bilateral pulmonary infiltrates. Electronically Signed   By: Lucienne Capers M.D.   On: 06/01/2020 21:49    Lab Data:  CBC: Recent Labs  Lab 06/01/20 1952 06/02/20 0242  WBC 5.7 5.8  NEUTROABS 3.8 3.7  HGB 11.0* 11.2*  HCT 34.0* 34.3*  MCV 87.9 87.9  PLT 226 604   Basic Metabolic Panel: Recent Labs  Lab 05/27/20 1213 06/01/20 1952 06/01/20 2243 06/02/20 0242  NA 134* 134*  --  135  K 4.1 3.9  --  4.1  CL 98 100  --  100  CO2 24 20*  --  24  GLUCOSE 101* 139*  --  129*  BUN 78* 94*  --  94*  CREATININE 4.36*  4.85* 4.86* 4.73*  CALCIUM 9.7 9.8  --  9.8   GFR: Estimated Creatinine Clearance: 8.4 mL/min (A) (by C-G formula based on SCr of 4.73 mg/dL (H)). Liver Function Tests: Recent Labs  Lab 06/01/20 1952  AST 19  ALT 18  ALKPHOS 77  BILITOT 0.6  PROT 7.2  ALBUMIN 3.7   No  results for input(s): LIPASE, AMYLASE in the last 168 hours. No results for input(s): AMMONIA in the last 168 hours. Coagulation Profile: No results for input(s): INR, PROTIME in the last 168 hours. Cardiac Enzymes: No results for input(s): CKTOTAL, CKMB, CKMBINDEX, TROPONINI in the last 168 hours. BNP (last 3 results) No results for input(s): PROBNP in the last 8760 hours. HbA1C: No results for input(s): HGBA1C in the last 72 hours. CBG: Recent Labs  Lab 06/02/20 0048 06/02/20 0801  GLUCAP 107* 89   Lipid Profile: No results for input(s): CHOL, HDL, LDLCALC, TRIG, CHOLHDL, LDLDIRECT in the last 72 hours. Thyroid Function Tests: No results for input(s): TSH, T4TOTAL, FREET4, T3FREE, THYROIDAB in the last 72 hours. Anemia Panel: No results for input(s): VITAMINB12, FOLATE, FERRITIN, TIBC, IRON, RETICCTPCT in the last 72 hours. Urine analysis:    Component Value Date/Time   COLORURINE YELLOW 02/12/2019 1215   APPEARANCEUR CLEAR 02/12/2019 1215   LABSPEC 1.015 02/12/2019 1215   PHURINE 6.5 02/12/2019 1215   GLUCOSEU NEGATIVE 02/12/2019 1215   HGBUR NEGATIVE 02/12/2019 1215   [place] 02/12/2019 1215   KETONESUR NEGATIVE 02/12/2019 1215   PROTEINUR 100 (A) 04/10/2012 1929   UROBILINOGEN 0.2 02/12/2019 1215   NITRITE NEGATIVE 02/12/2019 1215   LEUKOCYTESUR NEGATIVE 02/12/2019 1215     Bronx Brogden M.D. [facility] 06/02/2020, 10:43 AM  Available via Epic secure chat 7am-7pm After 7 pm, please refer to night coverage provider listed on amion.

## 2020-06-03 DIAGNOSIS — I5023 Acute on chronic systolic (congestive) heart failure: Secondary | ICD-10-CM

## 2020-06-03 DIAGNOSIS — N179 Acute kidney failure, unspecified: Secondary | ICD-10-CM | POA: Diagnosis not present

## 2020-06-03 DIAGNOSIS — I2510 Atherosclerotic heart disease of native coronary artery without angina pectoris: Secondary | ICD-10-CM | POA: Diagnosis not present

## 2020-06-03 DIAGNOSIS — I350 Nonrheumatic aortic (valve) stenosis: Secondary | ICD-10-CM | POA: Diagnosis not present

## 2020-06-03 DIAGNOSIS — I5043 Acute on chronic combined systolic (congestive) and diastolic (congestive) heart failure: Secondary | ICD-10-CM | POA: Diagnosis not present

## 2020-06-03 DIAGNOSIS — I509 Heart failure, unspecified: Secondary | ICD-10-CM | POA: Diagnosis not present

## 2020-06-03 LAB — BASIC METABOLIC PANEL
Anion gap: 14 (ref 5–15)
BUN: 91 mg/dL — ABNORMAL HIGH (ref 8–23)
CO2: 23 mmol/L (ref 22–32)
Calcium: 9.5 mg/dL (ref 8.9–10.3)
Chloride: 98 mmol/L (ref 98–111)
Creatinine, Ser: 4.69 mg/dL — ABNORMAL HIGH (ref 0.61–1.24)
GFR, Estimated: 11 mL/min — ABNORMAL LOW (ref >60)
Glucose, Bld: 96 mg/dL (ref 70–99)
Potassium: 3.6 mmol/L (ref 3.5–5.1)
Sodium: 135 mmol/L (ref 135–145)

## 2020-06-03 LAB — GLUCOSE, CAPILLARY
Glucose-Capillary: 85 mg/dL (ref 70–99)
Glucose-Capillary: 117 mg/dL — ABNORMAL HIGH (ref 70–99)
Glucose-Capillary: 145 mg/dL — ABNORMAL HIGH (ref 70–99)
Glucose-Capillary: 127 mg/dL — ABNORMAL HIGH (ref 70–99)

## 2020-06-03 NOTE — Evaluation (Signed)
Clinical/Bedside Swallow Evaluation Patient Details  Name: Henry [name] MRN: [id number] Date of Birth: 05/23/1924  Today's Date: 06/03/2020 Time: SLP Start Time (ACUTE ONLY): 1004 SLP Stop Time (ACUTE ONLY): 1019 SLP Time Calculation (min) (ACUTE ONLY): 15 min  Past Medical History:  Past Medical History:  Diagnosis Date  . Anemia   . CAD of autologous bypass graft   . Carotid artery disease ([place])   . Diabetes mellitus without complication ([place])   . DJD (degenerative joint disease)   . History of anemia of chronic disease   . Hypercholesteremia   . Hypertension   . NSTEMI (non-ST elevated myocardial infarction) ([place])   . PVD (peripheral vascular disease) with claudication ([place])   . Renal insufficiency   . Wears dentures   . Wears glasses    Past Surgical History:  Past Surgical History:  Procedure Laterality Date  . A/V FISTULAGRAM N/A 07/15/2018   Procedure: A/V FISTULAGRAM - Left Arm;  Surgeon: Serafina Mitchell, MD;  Location: [facility];  Service: Cardiovascular;  Laterality: N/A;  . AV FISTULA PLACEMENT Left 03/20/2018   Procedure: ARTERIOVENOUS (AV) FISTULA CREATION LEFT ARM;  Surgeon: Serafina Mitchell, MD;  Location: [facility];  Service: Vascular;  Laterality: Left;  . cataract surgery    . COLONOSCOPY     Hx: of  . CORONARY ARTERY BYPASS GRAFT  2000   by Dr. Cyndia Bent  . LEFT HEART CATH AND CORS/GRAFTS ANGIOGRAPHY N/A 03/27/2018   Procedure: LEFT HEART CATH AND CORS/GRAFTS ANGIOGRAPHY;  Surgeon: Sherren Mocha, MD;  Location: [facility];  Service: Cardiovascular;  Laterality: N/A;  . LUMBAR LAMINECTOMY/DECOMPRESSION MICRODISCECTOMY N/A 09/10/2012   Procedure: LUMBAR DECOMPRESSION,  IN SITU FUSION LUMBAR 4-5;  Surgeon: Melina Schools, MD;  Location: [facility];  Service: Orthopedics;  Laterality: N/A;  . MULTIPLE TOOTH EXTRACTIONS    . TONSILLECTOMY    . UPPER EXTREMITY VENOGRAPHY Left 07/15/2018   Procedure: UPPER EXTREMITY VENOGRAPHY;  Surgeon: Serafina Mitchell,  MD;  Location: [facility];  Service: Cardiovascular;  Laterality: Left;  UPPER ARM   HPI:  Pt is an 87 y.o. male with acute on chronic exacerbation of CHF and CKD stage V. CXR 4/6: Cardiac enlargement with diffuse bilateral pulmonary infiltrates. Per nutritionist in chart (03/30/2020), pt reports occasional swallow problems. PMH significant for NSTEMI, DM, DJD, HTN, CAD, PVD.   Assessment / Plan / Recommendation Clinical Impression  Pt was observed to have intermittent throat clearing and a cough x1 with thin liquids. He reports no subjective difficulty and states that his swallowing is better than PTA, attributing throat clearing to a dry throat. However, per dietitian at outside facility in his chart, he reports occasional swallowing problems PTA and received a referral to speech therapy regarding his swallowing. He stated this his speech therapist recommended no diet changes. Recommend continuation of his current diet. SLP will continue to follow given these s/s of potential aspiration today as well as potential swallowing difficulty PTA.   SLP Visit Diagnosis: Dysphagia, unspecified (R13.10)    Aspiration Risk  Mild aspiration risk    Diet Recommendation Regular;Thin liquid   Liquid Administration via: Cup;Straw Medication Administration: Whole meds with liquid Supervision: Patient able to self feed;Intermittent supervision to cue for compensatory strategies Compensations: Slow rate;Small sips/bites Postural Changes: Seated upright at 90 degrees    Other  Recommendations Oral Care Recommendations: Oral care BID   Follow up Recommendations  (tba)      Frequency and Duration min  2x/week  2 weeks       Prognosis Prognosis for Safe Diet Advancement: Good      Swallow Study   General HPI: Pt is an 87 y.o. male with acute on chronic exacerbation of CHF and CKD stage V. CXR 4/6: Cardiac enlargement with diffuse bilateral pulmonary infiltrates. Per nutritionist in chart  (03/30/2020), pt reports occasional swallow problems. PMH significant for NSTEMI, DM, DJD, HTN, CAD, PVD. Type of Study: Bedside Swallow Evaluation Previous Swallow Assessment: None in chart Diet Prior to this Study: Regular;Thin liquids Temperature Spikes Noted: No Respiratory Status: Nasal cannula History of Recent Intubation: No Behavior/Cognition: Alert;Cooperative;Pleasant mood Oral Cavity Assessment: Within Functional Limits Oral Care Completed by SLP: No Oral Cavity - Dentition: Adequate natural dentition Vision: Functional for self-feeding Self-Feeding Abilities: Able to feed self Patient Positioning: Upright in bed Baseline Vocal Quality: Low vocal intensity Volitional Cough: Strong Volitional Swallow: Able to elicit    Oral/Motor/Sensory Function Overall Oral Motor/Sensory Function: Within functional limits   Ice Chips Ice chips: Not tested   Thin Liquid Thin Liquid: Impaired Presentation: Cup;Straw Pharyngeal  Phase Impairments: Throat Clearing - Immediate;Throat Clearing - Delayed;Cough - Immediate    Nectar Thick Nectar Thick Liquid: Not tested   Honey Thick Honey Thick Liquid: Not tested   Puree Puree: Within functional limits Presentation: Self Fed   Solid     Solid: Within functional limits Presentation: [place]., SLP Student 06/03/2020,11:12 AM

## 2020-06-03 NOTE — Progress Notes (Addendum)
Progress Note  Patient Name: Henry [name] Date of Encounter: 06/03/2020  [facility] Cardiologist: Sherren Mocha, MD   Subjective   Breathing a little better today, not ready to go home. Wonders why he is putting out so little urine.   Inpatient Medications    Scheduled Meds: . aspirin EC  81 mg Oral Daily  . [START ON 06/06/2020] calcitRIOL  0.25 mcg Oral Q Mon  . calcium acetate  667 mg Oral BID WC  . docusate sodium  100 mg Oral Daily  . ferrous gluconate  324 mg Oral Daily  . furosemide  40 mg Intravenous BID  . heparin  5,000 Units Subcutaneous Q8H  . insulin aspart  0-5 Units Subcutaneous QHS  . insulin aspart  0-9 Units Subcutaneous TID WC  . levothyroxine  75 mcg Oral Daily  . melatonin  6 mg Oral QHS  . mirtazapine  7.5 mg Oral QHS  . multivitamin  1 tablet Oral QHS  . sodium chloride flush  3 mL Intravenous Q12H  . Vitamin D (Ergocalciferol)  50,000 Units Oral Q Thu   Continuous Infusions: . sodium chloride     PRN Meds: sodium chloride, acetaminophen, diphenoxylate-atropine, meclizine, nitroGLYCERIN, ondansetron (ZOFRAN) IV, ondansetron, polyvinyl alcohol, sodium chloride flush   Vital Signs    Vitals:   06/03/20 0508 06/03/20 0729 06/03/20 0800 06/03/20 1125  BP: (!) 141/75 (!) 155/86  134/74  Pulse: 76 77  77  Resp: 16 17 20 16   Temp: 97.7 F (36.5 C) 98 F (36.7 C)  98.1 F (36.7 C)  TempSrc: Oral   Oral  SpO2: 100% 100% 100% 100%  Weight: 58.3 kg     Height:        Intake/Output Summary (Last 24 hours) at 06/03/2020 1134 Last data filed at 06/03/2020 0902 Gross per 24 hour  Intake 863 ml  Output 750 ml  Net 113 ml   Last 3 Weights 06/03/2020 06/02/2020 05/27/2020  Weight (lbs) 128 lb 8.5 oz 13 lb 14.2 oz 137 lb  Weight (kg) 58.3 kg 6.3 kg 62.143 kg      Telemetry    SR - Personally Reviewed  ECG    None today - Personally Reviewed  Physical Exam   GEN: frail, elderly male, No acute distress at rest on O2 Neck: JVD 10-11  cm Cardiac: RRR, 2/6 murmurs, rubs, or gallops.  Respiratory: Rales to auscultation bilaterally. GI: Soft, nontender, non-distended  MS: No edema; No deformity. Neuro:  Nonfocal  Psych: Normal affect   Labs    High Sensitivity Troponin:   Recent Labs  Lab 06/01/20 1952 06/01/20 2146  TROPONINIHS 391* 381*      Chemistry Recent Labs  Lab 06/01/20 1952 06/01/20 2243 06/02/20 0242 06/03/20 0136  NA 134*  --  135 135  K 3.9  --  4.1 3.6  CL 100  --  100 98  CO2 20*  --  24 23  GLUCOSE 139*  --  129* 96  BUN 94*  --  94* 91*  CREATININE 4.85* 4.86* 4.73* 4.69*  CALCIUM 9.8  --  9.8 9.5  PROT 7.2  --   --   --   ALBUMIN 3.7  --   --   --   AST 19  --   --   --   ALT 18  --   --   --   ALKPHOS 77  --   --   --   BILITOT 0.6  --   --   --  GFRNONAA 10* 10* 11* 11*  ANIONGAP 14  --  11 14     Hematology Recent Labs  Lab 06/01/20 1952 06/02/20 0242  WBC 5.7 5.8  RBC 3.87* 3.90*  HGB 11.0* 11.2*  HCT 34.0* 34.3*  MCV 87.9 87.9  MCH 28.4 28.7  MCHC 32.4 32.7  RDW 17.0* 16.9*  PLT 226 219    BNP Recent Labs  Lab 06/01/20 1953  BNP >4,500.0*    Lab Results  Component Value Date   TSH 7.92 (H) 05/20/2020     DDimer No results for input(s): DDIMER in the last 168 hours.   Radiology    DG Chest [place] 1 View  Result Date: 06/01/2020 CLINICAL DATA:  Shortness of breath, renal failure, atrial fibrillation. EXAM: PORTABLE CHEST 1 VIEW COMPARISON:  03/04/2020 FINDINGS: Postoperative changes in the mediastinum. Cardiac enlargement. Diffuse bilateral pulmonary infiltrates, likely edema or multifocal pneumonia. Appearances are similar to prior study. No pleural effusions. No pneumothorax. Calcification of the aorta. IMPRESSION: Cardiac enlargement with diffuse bilateral pulmonary infiltrates. Electronically Signed   By: Lucienne Capers M.D.   On: 06/01/2020 21:49    Cardiac Studies   None this admit  Patient Profile     87 y.o. male w/ hx CAD, AS, CHF was  admitted 04/06 w/ CHF, worsening renal function.  Assessment & Plan    Acute on chronic CHF: - Nephrology has seen, we request they manage diuretics - renal function actually improved a little overnight - pt had 750 cc UOP yesterday, 280 cc so far today - current Lasix dose is 40 mg IV bid  Aspiration: - by witness and by history, CXR c/w this as well - Speech has seen, he is felt to be mild aspiration risk - per IM  FTT - combination of very poor renal function, CHF, AS, and aspiration, plus underlying CAD results in poor prognosis - he is DNR, Palliative Care has seen  Plan: Palliative Care has seen.    For questions or updates, please contact [place] Please consult [email or web address] for contact info under        Signed, Rosaria Ferries, PA-C  06/03/2020, 11:34 AM    Attending Note:   The patient was seen and examined.  Agree with assessment and plan as noted above.  Changes made to the above note as needed.  Patient seen and independently examined with  Melbourne Abts, PA .   We discussed all aspects of the encounter. I agree with the assessment and plan as stated above.  1.    Chronic respiratory failure:   Multifactorial.   Has CHF, CKD, chronic aspiration .  No new suggestions. He  Has been seen by palliative care .   Continue current meds.     I have spent a total of 40 minutes with patient reviewing hospital  notes , telemetry, EKGs, labs and examining patient as well as establishing an assessment and plan that was discussed with the patient. > 50% of time was spent in direct patient care.    Thayer Headings, Brooke Bonito., MD, [facility] 06/03/2020, 12:16 PM [address]  [place] Pager [phone]

## 2020-06-03 NOTE — Consult Note (Signed)
   [facility] Inpatient Consult   06/03/2020  Henry [name] [facility] October 20, 1924 [id number]  [place] [facility] [ACO] Patient: Medicare CMS DCE  Patient was screened for high risk score for unplanned readmission risk and  [place] Management services.  Primary Care Provider:  Cathlean Cower, MD, [place] an Embedded practice that does the [facility] follow up and has a Chronic Care Management team. Brief review of Electronic Medical Record [EMR] reveals patient is from home and has a history with [facility] Palliative team and had caregivers at home. Patient discussed in unit progression meeting.  Plan: Will continue to follow for progression and disposition needs for post hospital transition of care.  Please contact for further questions,  Henry Brood, RN BSN [facility] Liaison  [phone] business mobile phone Toll free office [phone]  Fax number: [phone] [email or web address]

## 2020-06-03 NOTE — Progress Notes (Addendum)
[facility]                                                                              Patient Demographics  Henry [name], is a 87 y.o. male, DOB - 03/21/1924, [id number]  Admit date - 06/01/2020   Admitting Physician Elwyn Reach, MD  Outpatient Primary MD for the patient is [name], MD  Outpatient specialists:   LOS - 2  days   Medical records reviewed and are as summarized below:    Chief Complaint  Patient presents with  . Shortness of Breath       Brief summary   Patient is a 87 year old male with history of severe aortic stenosis, CKD stage IV, diabetes mellitus, chronic systolic CHF, EF 30 to 02%, CAD with history of CABG, carotid artery disease, hypertension, hyperlipidemia, anemia of chronic disease presented with worsening shortness of breath, cough and hypoxia.  Patient also reported progressive leg swelling, orthopnea and PND.  Patient was on higher dose of Lasix and was becoming weak and dehydrated.  Lasix dose was decreased 5 days prior to admission to 40 mg twice daily.  Since then he started noticing progressive worsening of shortness of breath, swelling in the legs, orthopnea and PND.  Patient also felt better with O2 in the ER.  In ED, labs showed BUN 94, creatinine 4.85, BNP > 4500, troponin 391-> 381, hemoglobin 11.0 Chest x-ray showed cardiac enlargement with diffuse bilateral pulmonary infiltrates likely edema or multifocal pneumonia  Assessment & Plan    Principal Problem:   Acute on chronic combined systolic and diastolic congestive heart failure ([place]) -2D echo 03/05/2020 had shown EF of 30 to 35%, global hypokinesis, grade 1 DD, moderate to severe aortic valve stenosis  -Patient now presented with volume overload after decreasing Lasix dose, also has history of CKD 4, not on dialysis -With cardiac and renal status, cardiology, nephrology and palliative medicine consulted. -Per nephrology, unfortunately no reversibility and  renal failure is progressive, recommended palliative care services, patient does not want HD.  Continue symptom management. -Continue IV Lasix 40 mg twice daily today, supportive care -Renal function slightly improved today   Active Problems: Acute on CKD stage IV -Baseline creatinine ~3.9, has been gradually worsening, was 4.3 on 05/27/2020.  3.9 on 05/20/2020 -Presented with creatinine 4.8, follows nephrology, Dr. Marval Regal, has not been started on HD. -Creatinine slightly improved today    CAD (coronary artery disease), hypertension -Currently no acute chest pain, elevated troponins likely due to volume overload with underlying CKD, demand ischemia -Continue aspirin, Lasix  Severe aortic stenosis -Known history of severe aortic stenosis, follows cardiology outpatient, felt not a surgical candidate due to CKD.    Diabetes mellitus ([place]) type II, appears to be diet controlled, NIDDM, with complications of CKD, CAD -CBGs controlled, continue sliding scale insulin -Hemoglobin A1c 5.7 on 05/20/2020    Hypothyroidism -TSH 7.9 on 05/20/2020, -Continue Synthroid, outpatient follow-up with PCP   Chronic aspiration risk SLP evaluation  Code Status: DNR DVT Prophylaxis:  heparin injection 5,000 Units Start: 06/01/20 2245   Level of Care: Level of care: Telemetry Cardiac Family Communication: Discussed all  imaging results, lab results, explained to the patient   Disposition Plan:     Status is: Inpatient  Remains inpatient appropriate because:Inpatient level of care appropriate due to severity of illness   Dispo: The patient is from: Home              Anticipated d/c is to: Home              Patient currently is not medically stable to d/c.  Hopefully DC home in a.m. continue IV Lasix today.   Difficult to place patient No      Time Spent in minutes   25 minutes  Procedures:  None  Consultants:   Nephrology Cardiology Palliative medicine  Antimicrobials:    Anti-infectives (From admission, onward)   None         Medications  Scheduled Meds: . aspirin EC  81 mg Oral Daily  . [START ON 06/06/2020] calcitRIOL  0.25 mcg Oral Q Mon  . calcium acetate  667 mg Oral BID WC  . docusate sodium  100 mg Oral Daily  . ferrous gluconate  324 mg Oral Daily  . furosemide  40 mg Intravenous BID  . heparin  5,000 Units Subcutaneous Q8H  . insulin aspart  0-5 Units Subcutaneous QHS  . insulin aspart  0-9 Units Subcutaneous TID WC  . levothyroxine  75 mcg Oral Daily  . melatonin  6 mg Oral QHS  . mirtazapine  7.5 mg Oral QHS  . multivitamin  1 tablet Oral QHS  . sodium chloride flush  3 mL Intravenous Q12H  . Vitamin D (Ergocalciferol)  50,000 Units Oral Q Thu   Continuous Infusions: . sodium chloride     PRN Meds:.sodium chloride, acetaminophen, diphenoxylate-atropine, meclizine, nitroGLYCERIN, ondansetron (ZOFRAN) IV, ondansetron, polyvinyl alcohol, sodium chloride flush      Subjective:   Henry [name] was seen and examined today.  No acute complaints.  No chest pain, shortness of breath at improving, not currently at baseline.  Patient denies dizziness, abdominal pain, N/V/D/C, new weakness, numbess, tingling. No acute events overnight.    Objective:   Vitals:   06/03/20 0508 06/03/20 0729 06/03/20 0800 06/03/20 1125  BP: (!) 141/75 (!) 155/86  134/74  Pulse: 76 77  77  Resp: 16 17 20 16   Temp: 97.7 F (36.5 C) 98 F (36.7 C)  98.1 F (36.7 C)  TempSrc: Oral   Oral  SpO2: 100% 100% 100% 100%  Weight: 58.3 kg     Height:        Intake/Output Summary (Last 24 hours) at 06/03/2020 1435 Last data filed at 06/03/2020 1235 Gross per 24 hour  Intake 860 ml  Output 1125 ml  Net -265 ml     Wt Readings from Last 3 Encounters:  06/03/20 58.3 kg  05/27/20 62.1 kg  05/20/20 62.1 kg   Physical Exam  General: Alert and oriented x 3, NAD  Cardiovascular: S1 S2 clear, RRR, 2/6 murmur  Respiratory: Bilateral  rales  Gastrointestinal: Soft, nontender, nondistended, NBS  Ext: no pedal edema bilaterally  Neuro: no new deficits  Musculoskeletal: No cyanosis, clubbing  Skin: No rashes  Psych: Normal affect and demeanor, alert and oriented x3   Data Reviewed:  I have personally reviewed following labs and imaging studies  Micro Results Recent Results (from the past 240 hour(s))  Resp Panel by RT-PCR (Flu A&B, Covid) Nasopharyngeal Swab     Status: None   Collection Time: 06/02/20  3:09 AM   Specimen:  Nasopharyngeal Swab; Nasopharyngeal(NP) swabs in vial transport medium  Result Value Ref Range Status   SARS Coronavirus 2 by RT PCR NEGATIVE NEGATIVE Final    Comment: (NOTE) SARS-CoV-2 target nucleic acids are NOT DETECTED.  The SARS-CoV-2 RNA is generally detectable in upper respiratory specimens during the acute phase of infection. The lowest concentration of SARS-CoV-2 viral copies this assay can detect is 138 copies/mL. A negative result does not preclude SARS-Cov-2 infection and should not be used as the sole basis for treatment or other patient management decisions. A negative result may occur with  improper specimen collection/handling, submission of specimen other than nasopharyngeal swab, presence of viral mutation(s) within the areas targeted by this assay, and inadequate number of viral copies(<138 copies/mL). A negative result must be combined with clinical observations, patient history, and epidemiological information. The expected result is Negative.  Fact Sheet for Patients:  EntrepreneurPulse.com.au  Fact Sheet for Healthcare Providers:  IncredibleEmployment.be  This test is no t yet approved or cleared by the [place] FDA and  has been authorized for detection and/or diagnosis of SARS-CoV-2 by FDA under an Emergency Use Authorization (EUA). This EUA will remain  in effect (meaning this test can be used) for the duration of  the COVID-19 declaration under Section 564(b)(1) of the Act, 21 U.S.C.section 360bbb-3(b)(1), unless the authorization is terminated  or revoked sooner.       Influenza A by PCR NEGATIVE NEGATIVE Final   Influenza B by PCR NEGATIVE NEGATIVE Final    Comment: (NOTE) The Xpert Xpress SARS-CoV-2/FLU/RSV plus assay is intended as an aid in the diagnosis of influenza from Nasopharyngeal swab specimens and should not be used as a sole basis for treatment. Nasal washings and aspirates are unacceptable for Xpert Xpress SARS-CoV-2/FLU/RSV testing.  Fact Sheet for Patients: EntrepreneurPulse.com.au  Fact Sheet for Healthcare Providers: IncredibleEmployment.be  This test is not yet approved or cleared by the [place] FDA and has been authorized for detection and/or diagnosis of SARS-CoV-2 by FDA under an Emergency Use Authorization (EUA). This EUA will remain in effect (meaning this test can be used) for the duration of the COVID-19 declaration under Section 564(b)(1) of the Act, 21 U.S.C. section 360bbb-3(b)(1), unless the authorization is terminated or revoked.  Performed at [facility], [place] [address] [place], [place] [address]     Radiology Reports DG Chest [place] 1 View  Result Date: 06/01/2020 CLINICAL DATA:  Shortness of breath, renal failure, atrial fibrillation. EXAM: PORTABLE CHEST 1 VIEW COMPARISON:  03/04/2020 FINDINGS: Postoperative changes in the mediastinum. Cardiac enlargement. Diffuse bilateral pulmonary infiltrates, likely edema or multifocal pneumonia. Appearances are similar to prior study. No pleural effusions. No pneumothorax. Calcification of the aorta. IMPRESSION: Cardiac enlargement with diffuse bilateral pulmonary infiltrates. Electronically Signed   By: Lucienne Capers M.D.   On: 06/01/2020 21:49    Lab Data:  CBC: Recent Labs  Lab 06/01/20 1952 06/02/20 0242  WBC 5.7 5.8  NEUTROABS 3.8 3.7  HGB 11.0*  11.2*  HCT 34.0* 34.3*  MCV 87.9 87.9  PLT 226 308   Basic Metabolic Panel: Recent Labs  Lab 06/01/20 1952 06/01/20 2243 06/02/20 0242 06/03/20 0136  NA 134*  --  135 135  K 3.9  --  4.1 3.6  CL 100  --  100 98  CO2 20*  --  24 23  GLUCOSE 139*  --  129* 96  BUN 94*  --  94* 91*  CREATININE 4.85* 4.86* 4.73* 4.69*  CALCIUM 9.8  --  9.8 9.5   GFR: Estimated Creatinine Clearance: 7.9 mL/min (A) (by C-G formula based on SCr of 4.69 mg/dL (H)). Liver Function Tests: Recent Labs  Lab 06/01/20 1952  AST 19  ALT 18  ALKPHOS 77  BILITOT 0.6  PROT 7.2  ALBUMIN 3.7   No results for input(s): LIPASE, AMYLASE in the last 168 hours. No results for input(s): AMMONIA in the last 168 hours. Coagulation Profile: No results for input(s): INR, PROTIME in the last 168 hours. Cardiac Enzymes: No results for input(s): CKTOTAL, CKMB, CKMBINDEX, TROPONINI in the last 168 hours. BNP (last 3 results) No results for input(s): PROBNP in the last 8760 hours. HbA1C: No results for input(s): HGBA1C in the last 72 hours. CBG: Recent Labs  Lab 06/02/20 1149 06/02/20 1614 06/02/20 2129 06/03/20 0557 06/03/20 1113  GLUCAP 115* 140* 120* 85 117*   Lipid Profile: No results for input(s): CHOL, HDL, LDLCALC, TRIG, CHOLHDL, LDLDIRECT in the last 72 hours. Thyroid Function Tests: No results for input(s): TSH, T4TOTAL, FREET4, T3FREE, THYROIDAB in the last 72 hours. Anemia Panel: No results for input(s): VITAMINB12, FOLATE, FERRITIN, TIBC, IRON, RETICCTPCT in the last 72 hours. Urine analysis:    Component Value Date/Time   COLORURINE YELLOW 02/12/2019 1215   APPEARANCEUR CLEAR 02/12/2019 1215   LABSPEC 1.015 02/12/2019 1215   PHURINE 6.5 02/12/2019 1215   GLUCOSEU NEGATIVE 02/12/2019 1215   HGBUR NEGATIVE 02/12/2019 1215   [place] 02/12/2019 1215   KETONESUR NEGATIVE 02/12/2019 1215   PROTEINUR 100 (A) 04/10/2012 1929   UROBILINOGEN 0.2 02/12/2019 1215   NITRITE NEGATIVE  02/12/2019 1215   LEUKOCYTESUR NEGATIVE 02/12/2019 1215     Henry [name] M.D. [facility] 06/03/2020, 2:35 PM  Available via Epic secure chat 7am-7pm After 7 pm, please refer to night coverage provider listed on amion.

## 2020-06-04 DIAGNOSIS — I350 Nonrheumatic aortic (valve) stenosis: Secondary | ICD-10-CM | POA: Diagnosis not present

## 2020-06-04 DIAGNOSIS — I5043 Acute on chronic combined systolic (congestive) and diastolic (congestive) heart failure: Secondary | ICD-10-CM | POA: Diagnosis not present

## 2020-06-04 DIAGNOSIS — N179 Acute kidney failure, unspecified: Secondary | ICD-10-CM | POA: Diagnosis not present

## 2020-06-04 DIAGNOSIS — I509 Heart failure, unspecified: Secondary | ICD-10-CM | POA: Diagnosis not present

## 2020-06-04 DIAGNOSIS — I5023 Acute on chronic systolic (congestive) heart failure: Secondary | ICD-10-CM | POA: Diagnosis not present

## 2020-06-04 LAB — GLUCOSE, CAPILLARY
Glucose-Capillary: 98 mg/dL (ref 70–99)
Glucose-Capillary: 130 mg/dL — ABNORMAL HIGH (ref 70–99)
Glucose-Capillary: 121 mg/dL — ABNORMAL HIGH (ref 70–99)
Glucose-Capillary: 100 mg/dL — ABNORMAL HIGH (ref 70–99)

## 2020-06-04 LAB — BASIC METABOLIC PANEL
Anion gap: 14 (ref 5–15)
BUN: 88 mg/dL — ABNORMAL HIGH (ref 8–23)
CO2: 24 mmol/L (ref 22–32)
Calcium: 9.2 mg/dL (ref 8.9–10.3)
Chloride: 97 mmol/L — ABNORMAL LOW (ref 98–111)
Creatinine, Ser: 4.68 mg/dL — ABNORMAL HIGH (ref 0.61–1.24)
GFR, Estimated: 11 mL/min — ABNORMAL LOW (ref >60)
Glucose, Bld: 79 mg/dL (ref 70–99)
Potassium: 3.4 mmol/L — ABNORMAL LOW (ref 3.5–5.1)
Sodium: 135 mmol/L (ref 135–145)

## 2020-06-04 NOTE — Progress Notes (Signed)
[facility]                                                                              Patient Demographics  Henry [name], is a 87 y.o. male, DOB - 01-05-1925, [id number]  Admit date - 06/01/2020   Admitting Physician Elwyn Reach, MD  Outpatient Primary MD for the patient is [name], MD  Outpatient specialists:   LOS - 3  days   Medical records reviewed and are as summarized below:    Chief Complaint  Patient presents with  . Shortness of Breath       Brief summary   Patient is a 87 year old male with history of severe aortic stenosis, CKD stage IV, diabetes mellitus, chronic systolic CHF, EF 30 to 21%, CAD with history of CABG, carotid artery disease, hypertension, hyperlipidemia, anemia of chronic disease presented with worsening shortness of breath, cough and hypoxia.  Patient also reported progressive leg swelling, orthopnea and PND.  Patient was on higher dose of Lasix and was becoming weak and dehydrated.  Lasix dose was decreased 5 days prior to admission to 40 mg twice daily.  Since then he started noticing progressive worsening of shortness of breath, swelling in the legs, orthopnea and PND.  Patient also felt better with O2 in the ER.  In ED, labs showed BUN 94, creatinine 4.85, BNP > 4500, troponin 391-> 381, hemoglobin 11.0 Chest x-ray showed cardiac enlargement with diffuse bilateral pulmonary infiltrates likely edema or multifocal pneumonia  Assessment & Plan    Principal Problem:   Acute on chronic combined systolic and diastolic congestive heart failure ([place]), acute respiratory failure with hypoxia -2D echo 03/05/2020 had shown EF of 30 to 35%, global hypokinesis, grade 1 DD, moderate to severe aortic valve stenosis  -Patient now presented with volume overload after decreasing Lasix dose, also has history of CKD 4, not on dialysis -With cardiac and renal status, cardiology, nephrology and palliative medicine consulted. -Per  nephrology, unfortunately no reversibility and renal failure is progressive, recommended palliative care services, patient does not want HD.  Continue symptom management. -Continue IV Lasix 40 mg twice daily today, net -882 -Renal function slightly improved today, 4.6 -Currently O2 sats 100% on 2 L, patient asking about home O2.  Will do PT evaluation and home O2 evaluation today. -If continues to improve, will DC home on Lasix 40 mg oral daily   Active Problems: Acute on CKD stage IV -Baseline creatinine ~3.9, has been gradually worsening, was 4.3 on 05/27/2020.  3.9 on 05/20/2020 -Presented with creatinine 4.8, follows nephrology, Dr. Marval Regal, has not been started on HD. -Creatinine slightly improved today -Appreciate nephrology and palliative medicine recommendations, unfortunately no other options but supportive care    CAD (coronary artery disease), hypertension -Currently no acute chest pain, elevated troponins likely due to volume overload with underlying CKD, demand ischemia -Continue aspirin and Lasix  Severe aortic stenosis -Known history of severe aortic stenosis, follows cardiology outpatient, felt not a surgical candidate due to CKD.    Diabetes mellitus ([place]) type II, appears to be diet controlled, NIDDM, with complications of CKD, CAD -CBGs controlled, continue sliding scale  insulin -Hemoglobin A1c 5.7 on 05/20/2020    Hypothyroidism -TSH 7.9 on 05/20/2020, -Continue Synthroid, outpatient follow-up with PCP   Chronic aspiration risk SLP evaluation  Code Status: DNR DVT Prophylaxis:  heparin injection 5,000 Units Start: 06/01/20 2245   Level of Care: Level of care: Telemetry Cardiac Family Communication: Discussed all imaging results, lab results, explained to the patient   Disposition Plan:     Status is: Inpatient  Remains inpatient appropriate because:Inpatient level of care appropriate due to severity of illness   Dispo: The patient is from: Home               Anticipated d/c is to: Home              Patient currently is not medically stable to d/c.  Continue IV Lasix today, DC home in a.m.   Difficult to place patient No      Time Spent in minutes   25 minutes  Procedures:  None  Consultants:   Nephrology Cardiology Palliative medicine  Antimicrobials:   Anti-infectives (From admission, onward)   None         Medications  Scheduled Meds: . aspirin EC  81 mg Oral Daily  . [START ON 06/06/2020] calcitRIOL  0.25 mcg Oral Q Mon  . calcium acetate  667 mg Oral BID WC  . docusate sodium  100 mg Oral Daily  . ferrous gluconate  324 mg Oral Daily  . furosemide  40 mg Intravenous BID  . heparin  5,000 Units Subcutaneous Q8H  . insulin aspart  0-5 Units Subcutaneous QHS  . insulin aspart  0-9 Units Subcutaneous TID WC  . levothyroxine  75 mcg Oral Daily  . melatonin  6 mg Oral QHS  . mirtazapine  7.5 mg Oral QHS  . multivitamin  1 tablet Oral QHS  . sodium chloride flush  3 mL Intravenous Q12H  . Vitamin D (Ergocalciferol)  50,000 Units Oral Q Thu   Continuous Infusions: . sodium chloride     PRN Meds:.sodium chloride, acetaminophen, diphenoxylate-atropine, meclizine, nitroGLYCERIN, ondansetron (ZOFRAN) IV, ondansetron, polyvinyl alcohol, sodium chloride flush      Subjective:   Henry [name] was seen and examined today.  States feeling somewhat better, no acute complaints.   Patient denies dizziness, abdominal pain, N/V/D/C, new weakness, numbess, tingling. No acute events overnight.    Objective:   Vitals:   06/04/20 0255 06/04/20 0500 06/04/20 0700 06/04/20 0900  BP: (!) 141/59 (!) 142/77 123/64 136/64  Pulse: 77     Resp: 16 16    Temp:  97.6 F (36.4 C)    TempSrc:  Oral    SpO2:  100%    Weight:  59.8 kg    Height:        Intake/Output Summary (Last 24 hours) at 06/04/2020 1108 Last data filed at 06/04/2020 0500 Gross per 24 hour  Intake 180 ml  Output 1175 ml  Net -995 ml     Wt Readings from  Last 3 Encounters:  06/04/20 59.8 kg  05/27/20 62.1 kg  05/20/20 62.1 kg    Physical Exam  General: Alert and oriented x 3, NAD  Cardiovascular: S1 S2 clear, RRR.,  2/6 murmur  Respiratory: Coarse bibasilar rhonchi  Gastrointestinal: Soft, nontender, nondistended, NBS  Ext: no pedal edema bilaterally  Neuro: no new deficits  Musculoskeletal: No cyanosis, clubbing  Skin: No rashes  Psych: Normal affect and demeanor, alert and oriented x3     data Reviewed:  I  have personally reviewed following labs and imaging studies  Micro Results Recent Results (from the past 240 hour(s))  Resp Panel by RT-PCR (Flu A&B, Covid) Nasopharyngeal Swab     Status: None   Collection Time: 06/02/20  3:09 AM   Specimen: Nasopharyngeal Swab; Nasopharyngeal(NP) swabs in vial transport medium  Result Value Ref Range Status   SARS Coronavirus 2 by RT PCR NEGATIVE NEGATIVE Final    Comment: (NOTE) SARS-CoV-2 target nucleic acids are NOT DETECTED.  The SARS-CoV-2 RNA is generally detectable in upper respiratory specimens during the acute phase of infection. The lowest concentration of SARS-CoV-2 viral copies this assay can detect is 138 copies/mL. A negative result does not preclude SARS-Cov-2 infection and should not be used as the sole basis for treatment or other patient management decisions. A negative result may occur with  improper specimen collection/handling, submission of specimen other than nasopharyngeal swab, presence of viral mutation(s) within the areas targeted by this assay, and inadequate number of viral copies(<138 copies/mL). A negative result must be combined with clinical observations, patient history, and epidemiological information. The expected result is Negative.  Fact Sheet for Patients:  EntrepreneurPulse.com.au  Fact Sheet for Healthcare Providers:  IncredibleEmployment.be  This test is no t yet approved or cleared by the  [place] FDA and  has been authorized for detection and/or diagnosis of SARS-CoV-2 by FDA under an Emergency Use Authorization (EUA). This EUA will remain  in effect (meaning this test can be used) for the duration of the COVID-19 declaration under Section 564(b)(1) of the Act, 21 U.S.C.section 360bbb-3(b)(1), unless the authorization is terminated  or revoked sooner.       Influenza A by PCR NEGATIVE NEGATIVE Final   Influenza B by PCR NEGATIVE NEGATIVE Final    Comment: (NOTE) The Xpert Xpress SARS-CoV-2/FLU/RSV plus assay is intended as an aid in the diagnosis of influenza from Nasopharyngeal swab specimens and should not be used as a sole basis for treatment. Nasal washings and aspirates are unacceptable for Xpert Xpress SARS-CoV-2/FLU/RSV testing.  Fact Sheet for Patients: EntrepreneurPulse.com.au  Fact Sheet for Healthcare Providers: IncredibleEmployment.be  This test is not yet approved or cleared by the [place] FDA and has been authorized for detection and/or diagnosis of SARS-CoV-2 by FDA under an Emergency Use Authorization (EUA). This EUA will remain in effect (meaning this test can be used) for the duration of the COVID-19 declaration under Section 564(b)(1) of the Act, 21 U.S.C. section 360bbb-3(b)(1), unless the authorization is terminated or revoked.  Performed at [facility], [place] [address], [place], [place] [address]     Radiology Reports DG Chest [place] 1 View  Result Date: 06/01/2020 CLINICAL DATA:  Shortness of breath, renal failure, atrial fibrillation. EXAM: PORTABLE CHEST 1 VIEW COMPARISON:  03/04/2020 FINDINGS: Postoperative changes in the mediastinum. Cardiac enlargement. Diffuse bilateral pulmonary infiltrates, likely edema or multifocal pneumonia. Appearances are similar to prior study. No pleural effusions. No pneumothorax. Calcification of the aorta. IMPRESSION: Cardiac enlargement with diffuse  bilateral pulmonary infiltrates. Electronically Signed   By: Lucienne Capers M.D.   On: 06/01/2020 21:49    Lab Data:  CBC: Recent Labs  Lab 06/01/20 1952 06/02/20 0242  WBC 5.7 5.8  NEUTROABS 3.8 3.7  HGB 11.0* 11.2*  HCT 34.0* 34.3*  MCV 87.9 87.9  PLT 226 749   Basic Metabolic Panel: Recent Labs  Lab 06/01/20 1952 06/01/20 2243 06/02/20 0242 06/03/20 0136 06/04/20 0308  NA 134*  --  135 135 135  K  3.9  --  4.1 3.6 3.4*  CL 100  --  100 98 97*  CO2 20*  --  24 23 24   GLUCOSE 139*  --  129* 96 79  BUN 94*  --  94* 91* 88*  CREATININE 4.85* 4.86* 4.73* 4.69* 4.68*  CALCIUM 9.8  --  9.8 9.5 9.2   GFR: Estimated Creatinine Clearance: 8.2 mL/min (A) (by C-G formula based on SCr of 4.68 mg/dL (H)). Liver Function Tests: Recent Labs  Lab 06/01/20 1952  AST 19  ALT 18  ALKPHOS 77  BILITOT 0.6  PROT 7.2  ALBUMIN 3.7   No results for input(s): LIPASE, AMYLASE in the last 168 hours. No results for input(s): AMMONIA in the last 168 hours. Coagulation Profile: No results for input(s): INR, PROTIME in the last 168 hours. Cardiac Enzymes: No results for input(s): CKTOTAL, CKMB, CKMBINDEX, TROPONINI in the last 168 hours. BNP (last 3 results) No results for input(s): PROBNP in the last 8760 hours. HbA1C: No results for input(s): HGBA1C in the last 72 hours. CBG: Recent Labs  Lab 06/03/20 0557 06/03/20 1113 06/03/20 1549 06/03/20 2201 06/04/20 0606  GLUCAP 85 117* 145* 127* 98   Lipid Profile: No results for input(s): CHOL, HDL, LDLCALC, TRIG, CHOLHDL, LDLDIRECT in the last 72 hours. Thyroid Function Tests: No results for input(s): TSH, T4TOTAL, FREET4, T3FREE, THYROIDAB in the last 72 hours. Anemia Panel: No results for input(s): VITAMINB12, FOLATE, FERRITIN, TIBC, IRON, RETICCTPCT in the last 72 hours. Urine analysis:    Component Value Date/Time   COLORURINE YELLOW 02/12/2019 1215   APPEARANCEUR CLEAR 02/12/2019 1215   LABSPEC 1.015 02/12/2019 1215    PHURINE 6.5 02/12/2019 1215   GLUCOSEU NEGATIVE 02/12/2019 1215   HGBUR NEGATIVE 02/12/2019 1215   [place] 02/12/2019 1215   KETONESUR NEGATIVE 02/12/2019 1215   PROTEINUR 100 (A) 04/10/2012 1929   UROBILINOGEN 0.2 02/12/2019 1215   NITRITE NEGATIVE 02/12/2019 1215   LEUKOCYTESUR NEGATIVE 02/12/2019 1215     Tiarna Koppen M.D. [facility] 06/04/2020, 11:08 AM  Available via Epic secure chat 7am-7pm After 7 pm, please refer to night coverage provider listed on amion.

## 2020-06-04 NOTE — Progress Notes (Signed)
SATURATION QUALIFICATIONS: (This note is used to comply with regulatory documentation for home oxygen)  Patient Saturations on Room Air at Rest = 94%  Patient Saturations on Room Air while Ambulating = 80%  Patient Saturations on 2 Liters of oxygen while Ambulating = 95%  Please briefly explain why patient needs home oxygen: To maintain oxygen saturations > 90% when ambulating.  Wyona Almas, PT, DPT Acute Rehabilitation Services Pager [phone] Office [phone]

## 2020-06-04 NOTE — Progress Notes (Signed)
Progress Note  Patient Name: Henry [name] Date of Encounter: 06/04/2020  [facility] Cardiologist: Sherren Mocha, MD   Subjective   Pt is about the same  I/O are - 882 cc   Inpatient Medications    Scheduled Meds: . aspirin EC  81 mg Oral Daily  . [START ON 06/06/2020] calcitRIOL  0.25 mcg Oral Q Mon  . calcium acetate  667 mg Oral BID WC  . docusate sodium  100 mg Oral Daily  . ferrous gluconate  324 mg Oral Daily  . furosemide  40 mg Intravenous BID  . heparin  5,000 Units Subcutaneous Q8H  . insulin aspart  0-5 Units Subcutaneous QHS  . insulin aspart  0-9 Units Subcutaneous TID WC  . levothyroxine  75 mcg Oral Daily  . melatonin  6 mg Oral QHS  . mirtazapine  7.5 mg Oral QHS  . multivitamin  1 tablet Oral QHS  . sodium chloride flush  3 mL Intravenous Q12H  . Vitamin D (Ergocalciferol)  50,000 Units Oral Q Thu   Continuous Infusions: . sodium chloride     PRN Meds: sodium chloride, acetaminophen, diphenoxylate-atropine, meclizine, nitroGLYCERIN, ondansetron (ZOFRAN) IV, ondansetron, polyvinyl alcohol, sodium chloride flush   Vital Signs    Vitals:   06/04/20 0255 06/04/20 0500 06/04/20 0700 06/04/20 0900  BP: (!) 141/59 (!) 142/77 123/64 136/64  Pulse: 77     Resp: 16 16    Temp:  97.6 F (36.4 C)    TempSrc:  Oral    SpO2:  100%    Weight:  59.8 kg    Height:        Intake/Output Summary (Last 24 hours) at 06/04/2020 0952 Last data filed at 06/04/2020 0500 Gross per 24 hour  Intake 180 ml  Output 1175 ml  Net -995 ml   Last 3 Weights 06/04/2020 06/03/2020 06/02/2020  Weight (lbs) 131 lb 13.4 oz 128 lb 8.5 oz 13 lb 14.2 oz  Weight (kg) 59.8 kg 58.3 kg 6.3 kg      Telemetry    NSR - Personally Reviewed  ECG    None today - Personally Reviewed  Physical Exam   Physical Exam: Blood pressure 136/64, pulse 77, temperature 97.6 F (36.4 C), temperature source Oral, resp. rate 16, height 5\' 9"  (1.753 m), weight 59.8 kg, SpO2 100 %.  GEN:   Frail, chronically ill appearing mane HEENT: Normal NECK: No JVD; No carotid bruits LYMPHATICS: No lymphadenopathy CARDIAC:  RR ,  2/6 systolic murmur  RESPIRATORY:  Coarse , bilateral rhonchi, rales,  R>L ABDOMEN: Soft, non-tender, non-distended MUSCULOSKELETAL:  No edema; No deformity  SKIN: Warm and dry NEUROLOGIC:  Alert and oriented x 3   Labs    High Sensitivity Troponin:   Recent Labs  Lab 06/01/20 1952 06/01/20 2146  TROPONINIHS 391* 381*      Chemistry Recent Labs  Lab 06/01/20 1952 06/01/20 2243 06/02/20 0242 06/03/20 0136 06/04/20 0308  NA 134*  --  135 135 135  K 3.9  --  4.1 3.6 3.4*  CL 100  --  100 98 97*  CO2 20*  --  24 23 24   GLUCOSE 139*  --  129* 96 79  BUN 94*  --  94* 91* 88*  CREATININE 4.85*   < > 4.73* 4.69* 4.68*  CALCIUM 9.8  --  9.8 9.5 9.2  PROT 7.2  --   --   --   --   ALBUMIN 3.7  --   --   --   --  AST 19  --   --   --   --   ALT 18  --   --   --   --   ALKPHOS 77  --   --   --   --   BILITOT 0.6  --   --   --   --   GFRNONAA 10*   < > 11* 11* 11*  ANIONGAP 14  --  11 14 14    < > = values in this interval not displayed.     Hematology Recent Labs  Lab 06/01/20 1952 06/02/20 0242  WBC 5.7 5.8  RBC 3.87* 3.90*  HGB 11.0* 11.2*  HCT 34.0* 34.3*  MCV 87.9 87.9  MCH 28.4 28.7  MCHC 32.4 32.7  RDW 17.0* 16.9*  PLT 226 219    BNP Recent Labs  Lab 06/01/20 1953  BNP >4,500.0*    Lab Results  Component Value Date   TSH 7.92 (H) 05/20/2020     DDimer No results for input(s): DDIMER in the last 168 hours.   Radiology    No results found.  Cardiac Studies   None this admit  Patient Profile     87 y.o. male w/ hx CAD, AS, CHF was admitted 04/06 w/ CHF, worsening renal function.  Assessment & Plan    Acute on chronic CHF: He is net negative so far this admission [place] has significant lung congestion related to his chronic aspiration   Aspiration:  remains an issue  Aortic stenosis:  He is not a  candidate for TAVR .  Cont supportive care   I agree with plans for him to be discharged tomorrow .  [facility] will sign off.   Medication Recommendations:  Cont current meds, supportive care  Other recommendations (labs, testing, etc):   Follow up as an outpatient:  With hospice, palliative care     Mertie Moores, MD  06/04/2020 10:01 AM    [facility] [address],  [address], [place]  [id number] Phone: [phone]; Fax: [phone]

## 2020-06-04 NOTE — TOC Initial Note (Signed)
Transition of Care [facility]) - Initial/Assessment Note    Patient Details  Name: Henry [name] MRN: [id number] Date of Birth: 1924-03-14  Transition of [facility]) CM/SW Contact:    Konrad Penta, RN Phone Number: [phone] 06/04/2020, 4:36 PM  Clinical Narrative:    Spoke with patient who states he wants [facility] for  PT/OT/RN/aide services. States he lives alone and his daughter Henry [name] checks comes to check on him several times a day. States Henry [name] takes him to MD appointments as well and sometimes stays with him as needed.   Has chair lift , rolling walker, bedside commode. Requests quad cane to be ordered. Also reports he will need home oxygen. Noted oxygen note in system.   Will need home health orders for PT/OT/RN/aide and home o2 orders  [place] contacted for cane and made aware that patient will need oxygen but orders needed.   Confirmed with Tommi Rumps with [facility] that [facility] can accept patient. [place] orders needed.   Message sent to MD for orders.   Patient likely to transition home tomorrow.    Expected Discharge Plan: [place] Barriers to Discharge: No Barriers Identified   Patient Goals and CMS Choice Patient states their goals for this hospitalization and ongoing recovery are:: return home CMS Medicare.gov Compare Post Acute Care list provided to:: Patient Choice offered to / list presented to : Patient  Expected Discharge Plan and Services Expected Discharge Plan: [place] In-house Referral: Hospice / Palliative Care Discharge Planning Services: CM Consult Post Acute Care Choice: [place] Living arrangements for the past 2 months: Single Family Home                 DME Arranged: Kasandra Knudsen DME Agency: [employer] Date DME Agency Contacted: 06/04/20 Time DME Agency Contacted: [phone] Representative spoke with at DME Agency: [place]: PT,OT,RN,Nurse's Aide [place]: [place] Date [place]: 06/04/20 Time [place]: 1636 Representative spoke with at [place]: Tommi Rumps  Prior Living Arrangements/Services Living arrangements for the past 2 months: [place] with:: Self   Do you feel safe going back to the place where you live?: Yes          Current home services: DME    Activities of Daily Living      Permission Sought/Granted                  Emotional Assessment              Admission diagnosis:  Acute exacerbation of CHF (congestive heart failure) ([place]) [I50.9] AKI (acute kidney injury) ([place]) [N17.9] Acute on chronic congestive heart failure, unspecified heart failure type [facility]) [I50.9] Patient Active Problem List   Diagnosis Date Noted  . Acute exacerbation of CHF (congestive heart failure) ([place]) 06/01/2020  . Debility 05/20/2020  . Systolic congestive heart failure ([place]) 03/04/2020  . Chronic hypoxemic respiratory failure ([place]) 12/03/2019  . Goals of care, counseling/discussion   . Palliative care by specialist   . Encounter for hospice care discussion   . Acute on chronic congestive heart failure ([place]) 11/09/2019  . DNR (do not resuscitate) 11/09/2019  . Mobitz type 1 second degree atrioventricular block 05/02/2018  . Chronic diastolic CHF (congestive heart failure) ([place])   . NSTEMI 02/2018 - 2/4 grafts occluded; Med Rx   . Respiratory failure with hypoxia ([place]) 03/25/2018  . Pulmonary edema 03/25/2018  . Respiratory failure,  acute ([place]) 03/25/2018  . Elevated troponin 03/25/2018  . Vitamin D deficiency 01/01/2018  . CKD (chronic kidney disease) stage 5, GFR less than 15 ml/min (HCC) 07/24/2017  . Carotid artery disease ([place])   . Hypertension   . PVD (peripheral vascular disease) with claudication ([place])   . Peripheral vascular disease with claudication ([place]) 01/22/2017  . Aortic valve stenosis, unspecified etiology 01/22/2017  . Postinflammatory pulmonary fibrosis ([place])  07/31/2016  . Acute bronchitis 03/07/2016  . Hypothyroidism 08/31/2014  . Carotid stenosis 08/19/2012  . DJD (degenerative joint disease) 08/19/2012  . LBP (low back pain) 08/19/2012  . Cerebral atrophy ([place]) 08/19/2012  . Dizziness 04/13/2012  . Diabetes ([place]) 04/13/2012  . Nausea and vomiting 04/10/2012  . CAD (coronary artery disease) 07/13/2008  . HYPERCHOLESTEROLEMIA 04/04/2007  . Anemia of chronic disease 04/04/2007   PCP:  [name], MD Pharmacy:   [place], [place] - [address] [place] [address] Phone: [phone] Fax: [phone]     Social Determinants of Health (SDOH) Interventions    Readmission Risk Interventions No flowsheet data found.

## 2020-06-04 NOTE — Evaluation (Addendum)
Physical Therapy Evaluation Patient Details Name: Henry [name] MRN: [id number] DOB: 1924-05-21 Today's Date: 06/04/2020   History of Present Illness  Pt is a 87 y.o. M who presents with worsening SOB, cough, hypoxia. Admitted 4/6 with acute on chronic combine systolic and diastolic congestive heart failure. Significant PMH: severe aortic stenosis, CKD stage IV, diabetes mellitus, chronic systolic CHF, EF 30 to 01%, CAD with history of CABG, carotid artery disease, hypertension, hyperlipidemia, anemia.  Clinical Impression  PTA, pt lives alone, uses a Rollator for mobility and is independent with ADL's. Pt daughter [name] available to provide necessary assist in order for him to return home. Pt presents with decreased functional mobility secondary to generalized weakness, poor standing balance, decreased endurance. Pt requiring minA for all aspects of mobility. Ambulating x 30 feet with a walker and multiple instances of lateral loss of balance. SpO2 > 90% on 2L O2. Education provided to pt/pt family regarding guarding for all transfers/ambulation and use of gait belt (pt already has one). See below for recommendations.     Follow Up Recommendations Home health PT;Supervision/Assistance - 24 hour    Equipment Recommendations  None recommended by PT    Recommendations for Other Services       Precautions / Restrictions Precautions Precautions: Fall;Other (comment) Precaution Comments: watch O2 Restrictions Weight Bearing Restrictions: No      Mobility  Bed Mobility Overal bed mobility: Needs Assistance Bed Mobility: Supine to Sit;Sit to Supine     Supine to sit: Min assist Sit to supine: Min guard   General bed mobility comments: MinA for trunk to upright, min guard for return to supine    Transfers Overall transfer level: Needs assistance Equipment used: Rolling walker (2 wheeled) Transfers: Sit to/from Stand Sit to Stand: Min assist         General transfer comment:  MinA to rise and initially steady  Ambulation/Gait Ambulation/Gait assistance: Herbalist (Feet): 30 Feet Assistive device: Rolling walker (2 wheeled) Gait Pattern/deviations: Step-through pattern;Decreased stride length Gait velocity: decreased Gait velocity interpretation: <1.8 ft/sec, indicate of risk for recurrent falls General Gait Details: Pt with multiple episodes of lateral LOB, requiring minA to correct. Pt stopping to regain balance.  Stairs            Wheelchair Mobility    Modified Rankin (Stroke Patients Only)       Balance Overall balance assessment: Needs assistance Sitting-balance support: Feet supported Sitting balance-Leahy Scale: Fair     Standing balance support: Bilateral upper extremity supported Standing balance-Leahy Scale: Poor Standing balance comment: reliant on external support                             Pertinent Vitals/Pain Pain Assessment: Faces Faces Pain Scale: No hurt    Home Living Family/patient expects to be discharged to:: Private residence Living Arrangements: Alone Available Help at Discharge: Family;Available PRN/intermittently Type of Home: House Home Access: Stairs to enter Entrance Stairs-Rails: None Entrance Stairs-Number of Steps: 2 Home Layout: Two level;Able to live on main level with bedroom/bathroom (stair lift to upper level) Home Equipment: Cane - quad;Tub bench;Grab bars - tub/shower;Walker - 4 wheels;Wheelchair - [employer];Other (comment) (gait belt)      Prior Function Level of Independence: Needs assistance   Gait / Transfers Assistance Needed: using Rollator, family assisting with stair negotiation  ADL's / Homemaking Assistance Needed: independent ADL's        Hand Dominance  Dominant Hand: Right    Extremity/Trunk Assessment   Upper Extremity Assessment Upper Extremity Assessment: Generalized weakness    Lower Extremity Assessment Lower Extremity  Assessment: Generalized weakness    Cervical / Trunk Assessment Cervical / Trunk Assessment: Kyphotic  Communication   Communication: No difficulties  Cognition Arousal/Alertness: Awake/alert Behavior During Therapy: WFL for tasks assessed/performed Overall Cognitive Status: Within Functional Limits for tasks assessed                                        General Comments      Exercises     Assessment/Plan    PT Assessment Patient needs continued PT services  PT Problem List Decreased strength;Decreased activity tolerance;Decreased balance;Decreased mobility       PT Treatment Interventions DME instruction;Gait training;Stair training;Functional mobility training;Therapeutic activities;Therapeutic exercise;Balance training;Patient/family education    PT Goals (Current goals can be found in the Care Plan section)  Acute Rehab PT Goals Patient Stated Goal: to return home PT Goal Formulation: With patient/family Time For Goal Achievement: 06/18/20 Potential to Achieve Goals: Good    Frequency Min 3X/week   Barriers to discharge        Co-evaluation               AM-PAC PT "6 Clicks" Mobility  Outcome Measure Help needed turning from your back to your side while in a flat bed without using bedrails?: None Help needed moving from lying on your back to sitting on the side of a flat bed without using bedrails?: A Little Help needed moving to and from a bed to a chair (including a wheelchair)?: A Little Help needed standing up from a chair using your arms (e.g., wheelchair or bedside chair)?: A Little Help needed to walk in hospital room?: A Little Help needed climbing 3-5 steps with a railing? : A Lot 6 Click Score: 18    End of Session Equipment Utilized During Treatment: Gait belt;Oxygen Activity Tolerance: Patient tolerated treatment well Patient left: in bed;with call bell/phone within reach;with family/visitor present Nurse Communication:  Mobility status PT Visit Diagnosis: Unsteadiness on feet (R26.81);Muscle weakness (generalized) (M62.81);Difficulty in walking, not elsewhere classified (R26.2)    Time: 0240-9735 PT Time Calculation (min) (ACUTE ONLY): 32 min   Charges:   PT Evaluation $PT Eval Moderate Complexity: 1 Mod PT Treatments $Therapeutic Activity: 8-22 mins        Wyona Almas, PT, DPT Acute Rehabilitation Services Pager [phone] Office [phone]   Deno Etienne 06/04/2020, 4:12 PM

## 2020-06-04 NOTE — Plan of Care (Signed)

## 2020-06-05 DIAGNOSIS — I5043 Acute on chronic combined systolic (congestive) and diastolic (congestive) heart failure: Secondary | ICD-10-CM | POA: Diagnosis not present

## 2020-06-05 DIAGNOSIS — I509 Heart failure, unspecified: Secondary | ICD-10-CM | POA: Diagnosis not present

## 2020-06-05 DIAGNOSIS — N179 Acute kidney failure, unspecified: Secondary | ICD-10-CM | POA: Diagnosis not present

## 2020-06-05 DIAGNOSIS — I350 Nonrheumatic aortic (valve) stenosis: Secondary | ICD-10-CM | POA: Diagnosis not present

## 2020-06-05 DIAGNOSIS — I5023 Acute on chronic systolic (congestive) heart failure: Secondary | ICD-10-CM | POA: Diagnosis not present

## 2020-06-05 LAB — BASIC METABOLIC PANEL
Anion gap: 9 (ref 5–15)
BUN: 82 mg/dL — ABNORMAL HIGH (ref 8–23)
CO2: 27 mmol/L (ref 22–32)
Calcium: 9 mg/dL (ref 8.9–10.3)
Chloride: 98 mmol/L (ref 98–111)
Creatinine, Ser: 4.56 mg/dL — ABNORMAL HIGH (ref 0.61–1.24)
GFR, Estimated: 11 mL/min — ABNORMAL LOW (ref >60)
Glucose, Bld: 100 mg/dL — ABNORMAL HIGH (ref 70–99)
Potassium: 3.2 mmol/L — ABNORMAL LOW (ref 3.5–5.1)
Sodium: 134 mmol/L — ABNORMAL LOW (ref 135–145)

## 2020-06-05 LAB — GLUCOSE, CAPILLARY
Glucose-Capillary: 93 mg/dL (ref 70–99)
Glucose-Capillary: 124 mg/dL — ABNORMAL HIGH (ref 70–99)

## 2020-06-05 LAB — MAGNESIUM: Magnesium: 2 mg/dL (ref 1.7–2.4)

## 2020-06-05 MED ORDER — MIRTAZAPINE 7.5 MG PO TABS: 7.5000 mg | ORAL_TABLET | Freq: Every day | ORAL | 1 refills | Status: AC

## 2020-06-05 MED ORDER — FUROSEMIDE 40 MG PO TABS: 40.0000 mg | ORAL_TABLET | Freq: Every day | ORAL | 3 refills | Status: AC

## 2020-06-05 MED ORDER — POTASSIUM CHLORIDE CRYS ER 20 MEQ PO TBCR
40.0000 meq | EXTENDED_RELEASE_TABLET | Freq: Once | ORAL | Status: AC
  Administered 2020-06-05: 40 meq via ORAL
  Filled 2020-06-05: qty 2

## 2020-06-05 NOTE — Discharge Summary (Signed)
Physician Discharge Summary   Patient ID: Henry [name] MRN: [id number] DOB/AGE: 85-May-1926 85 y.o.  Admit date: 06/01/2020 Discharge date: 06/05/2020  Primary Care Physician:  [name], MD   Recommendations for Outpatient Follow-up:  1. Follow up with PCP in 1-2 weeks 2. Follow-up BMET at appointment 3. Continue Lasix 40 mg daily 4. Continue 2 L O2 while ambulating   SATURATION QUALIFICATIONS: (This note is used to comply with regulatory documentation for home oxygen)  Patient Saturations on Room Air at Rest = 94%  Patient Saturations on Room Air while Ambulating = 80%  Patient Saturations on 2 Liters of oxygen while Ambulating = 95%   Home Health: Home health PT OT, RN, aide Equipment/Devices:   Discharge Condition: stable  CODE STATUS: DNR   Diet recommendation: Low-salt diet   Discharge Diagnoses:    Acute on chronic combined systolic and diastolic CHF Acute respiratory failure with hypoxia . CAD (coronary artery disease) Diabetes mellitus type 2, appears to be diet controlled Severe aortic stenosis . CKD (chronic kidney disease) stage 5, GFR less than 15 ml/min (HCC) . DNR (do not resuscitate) . Hypertension . Hypothyroidism   Consults:   Cardiology Nephrology Palliative medicine    Allergies:  No Known Allergies   DISCHARGE MEDICATIONS: Allergies as of 06/05/2020   No Known Allergies     Medication List    TAKE these medications   Accu-Chek Aviva Plus test strip Generic drug: glucose blood Use as instructed daily E11.9   accu-chek multiclix lancets USE TWICE DAILY.   acetaminophen 500 MG tablet Commonly known as: TYLENOL Take 1,000 mg by mouth every 6 (six) hours as needed for moderate pain.   aspirin EC 81 MG tablet Take 81 mg by mouth daily.   b complex-vitamin c-folic acid 0.8 MG Tabs tablet Take 1 tablet by mouth at bedtime.   calcitRIOL 0.25 MCG capsule Commonly known as: ROCALTROL Take 0.25 mcg by mouth once a  week. Monday   calcium acetate 667 MG capsule Commonly known as: PHOSLO Take 667 mg by mouth 2 (two) times daily with a meal.   Darbepoetin Alfa 100 MCG/0.5ML Sosy injection Commonly known as: ARANESP Inject 100 mcg into the skin every 30 (thirty) days.   diphenoxylate-atropine 2.5-0.025 MG tablet Commonly known as: Lomotil Take 1 tablet by mouth 4 (four) times daily as needed for diarrhea or loose stools.   docusate sodium 100 MG capsule Commonly known as: COLACE Take 100 mg by mouth daily.   ergocalciferol 1.25 MG (50000 UT) capsule Commonly known as: VITAMIN D2 Take 50,000 Units by mouth once a week.   ferrous gluconate 324 MG tablet Commonly known as: FERGON Take 324 mg by mouth daily.   furosemide 40 MG tablet Commonly known as: LASIX Take 1 tablet (40 mg total) by mouth daily.   levothyroxine 75 MCG tablet Commonly known as: SYNTHROID Take 1 tablet (75 mcg total) by mouth daily.   meclizine 25 MG tablet Commonly known as: ANTIVERT Take 25 mg by mouth daily as needed for dizziness.   melatonin 3 MG Tabs tablet Take 6 mg by mouth at bedtime.   mirtazapine 7.5 MG tablet Commonly known as: REMERON Take 1 tablet (7.5 mg total) by mouth at bedtime.   nitroGLYCERIN 0.4 MG SL tablet Commonly known as: NITROSTAT Place 1 tablet (0.4 mg total) under the tongue every 5 (five) minutes as needed for chest pain.   ondansetron 4 MG tablet Commonly known as: ZOFRAN Take 4 mg by mouth  every 8 (eight) hours as needed for nausea or vomiting.   PRESERVISION AREDS 2 PO Take 1 tablet by mouth in the morning and at bedtime.   SYSTANE COMPLETE OP Place 1 drop into both eyes daily as needed (dry eyes).            Durable Medical Equipment  (From admission, onward)         Start     Ordered   06/05/20 0807  For home use only DME oxygen  Once       Question Answer Comment  Length of Need 12 Months   Mode or (Route) Nasal cannula   Liters per Minute 2   Frequency  Continuous (stationary and portable oxygen unit needed)   Oxygen conserving device Yes   Oxygen delivery system Gas      06/05/20 0806           Brief H and P: For complete details please refer to admission H and P, but in brief * Patient is a 87 year old male with history of severe aortic stenosis, CKD stage IV, diabetes mellitus, chronic systolic CHF, EF 30 to 14%, CAD with history of CABG, carotid artery disease, hypertension, hyperlipidemia, anemia of chronic disease presented with worsening shortness of breath, cough and hypoxia.  Patient also reported progressive leg swelling, orthopnea and PND.  Patient was on higher dose of Lasix and was becoming weak and dehydrated.  Lasix dose was decreased 5 days prior to admission to 40 mg twice daily.  Since then he started noticing progressive worsening of shortness of breath, swelling in the legs, orthopnea and PND.  Patient also felt better with O2 in the ER.  In ED, labs showed BUN 94, creatinine 4.85, BNP > 4500, troponin 391-> 381, hemoglobin 11.0 Chest x-ray showed cardiac enlargement with diffuse bilateral pulmonary infiltrates likely edema or multifocal pneumonia  Hospital Course:   Acute on chronic combined systolic and diastolic congestive heart failure ([place]), acute respiratory failure with hypoxia -2D echo 03/05/2020 had shown EF of 30 to 35%, global hypokinesis, grade 1 DD, moderate to severe aortic valve stenosis  -Patient now presented with volume overload after decreasing Lasix dose, also has history of CKD 4, not on dialysis. With cardiac and renal status, advanced CKD stage IV, cardiology, nephrology and palliative medicine consulted. -Per nephrology, unfortunately no reversibility and renal failure is progressive, recommended palliative care services, patient does not want HD.  Continue symptom management. -Patient was placed on IV Lasix 40 mg twice a day, per nephrology discharged on Lasix 40 mg daily, outpatient follow-up with  Dr. Arty Baumgartner -Renal function slightly improved today, 4.5 -Home O2 evaluation was done, patient qualifies for 2 L O2 -Continue outpatient palliative care support   Acute on CKD stage IV -Baseline creatinine ~3.9, has been gradually worsening, was 4.3 on 05/27/2020.  3.9 on 05/20/2020 -Presented with creatinine 4.8, follows nephrology, Dr. Marval Regal, has not been started on HD. -Creatinine slightly improved today -Appreciate nephrology and palliative medicine recommendations, unfortunately no other options but supportive care    CAD (coronary artery disease), hypertension -Currently no acute chest pain, elevated troponins likely due to volume overload with underlying CKD, demand ischemia -Continue aspirin and Lasix  Severe aortic stenosis -Known history of severe aortic stenosis, follows cardiology outpatient, felt not a surgical candidate due to CKD. -Overnight had transient bradycardia with heart rate down to 42, second-degree heart block Mobitz type II.  Outpatient follow-up with Dr. Burt Knack.  No further work-up, cleared by cardiology to  be discharged home.    Diabetes mellitus ([place]) type II, appears to be diet controlled, NIDDM, with complications of CKD, CAD -CBGs remain controlled -Hemoglobin A1c 5.7 on 05/20/2020    Hypothyroidism -TSH 7.9 on 05/20/2020, -Continue Synthroid, outpatient follow-up with PCP   Chronic aspiration risk, known history SLP evaluation was ordered   Day of Discharge S: Feeling better, okay to go home today.  No chest pain, no worsening of shortness of breath today.  BP 139/80 (BP Location: Right Arm)   Pulse 84   Temp 97.6 F (36.4 C) (Oral)   Resp 18   Ht 5\' 9"  (1.753 m) Comment: Simultaneous filing. User may not have seen previous data.  Wt 58 kg   SpO2 100%   BMI 18.87 kg/m   Physical Exam: General: Alert and awake oriented x3 not in any acute distress. CVS: S1-S2 clear, 2/6 systolic murmur Chest: Diminished breath sound at the  bases, rhonchi Abdomen: soft nontender, nondistended, normal bowel sounds Extremities: no cyanosis, clubbing or edema noted bilaterally Neuro: Cranial nerves II-XII intact, no focal neurological deficits    Get Medicines reviewed and adjusted: Please take all your medications with you for your next visit with your Primary MD  Please request your Primary MD to go over all hospital tests and procedure/radiological results at the follow up. Please ask your Primary MD to get all Hospital records sent to his/her office.  If you experience worsening of your admission symptoms, develop shortness of breath, life threatening emergency, suicidal or homicidal thoughts you must seek medical attention immediately by calling 911 or calling your MD immediately  if symptoms less severe.  You must read complete instructions/literature along with all the possible adverse reactions/side effects for all the Medicines you take and that have been prescribed to you. Take any new Medicines after you have completely understood and accept all the possible adverse reactions/side effects.   Do not drive when taking pain medications.   Do not take more than prescribed Pain, Sleep and Anxiety Medications  Special Instructions: If you have smoked or chewed Tobacco  in the last 2 yrs please stop smoking, stop any regular Alcohol  and or any Recreational drug use.  Wear Seat belts while driving.  Please note  You were cared for by a hospitalist during your hospital stay. Once you are discharged, your primary care physician will handle any further medical issues. Please note that NO REFILLS for any discharge medications will be authorized once you are discharged, as it is imperative that you return to your primary care physician (or establish a relationship with a primary care physician if you do not have one) for your aftercare needs so that they can reassess your need for medications and monitor your lab values.   The  results of significant diagnostics from this hospitalization (including imaging, microbiology, ancillary and laboratory) are listed below for reference.      Procedures/Studies:  DG Chest Port 1 View  Result Date: 06/01/2020 CLINICAL DATA:  Shortness of breath, renal failure, atrial fibrillation. EXAM: PORTABLE CHEST 1 VIEW COMPARISON:  03/04/2020 FINDINGS: Postoperative changes in the mediastinum. Cardiac enlargement. Diffuse bilateral pulmonary infiltrates, likely edema or multifocal pneumonia. Appearances are similar to prior study. No pleural effusions. No pneumothorax. Calcification of the aorta. IMPRESSION: Cardiac enlargement with diffuse bilateral pulmonary infiltrates. Electronically Signed   By: Lucienne Capers M.D.   On: 06/01/2020 21:49       LAB RESULTS: Basic Metabolic Panel: Recent Labs  Lab 06/04/20 0308  06/05/20 0154 06/05/20 0530  NA 135 134*  --   K 3.4* 3.2*  --   CL 97* 98  --   CO2 24 27  --   GLUCOSE 79 100*  --   BUN 88* 82*  --   CREATININE 4.68* 4.56*  --   CALCIUM 9.2 9.0  --   MG  --   --  2.0   Liver Function Tests: Recent Labs  Lab 06/01/20 1952  AST 19  ALT 18  ALKPHOS 77  BILITOT 0.6  PROT 7.2  ALBUMIN 3.7   No results for input(s): LIPASE, AMYLASE in the last 168 hours. No results for input(s): AMMONIA in the last 168 hours. CBC: Recent Labs  Lab 06/01/20 1952 06/02/20 0242  WBC 5.7 5.8  NEUTROABS 3.8 3.7  HGB 11.0* 11.2*  HCT 34.0* 34.3*  MCV 87.9 87.9  PLT 226 219   Cardiac Enzymes: No results for input(s): CKTOTAL, CKMB, CKMBINDEX, TROPONINI in the last 168 hours. BNP: Invalid input(s): POCBNP CBG: Recent Labs  Lab 06/05/20 0501 06/05/20 1112  GLUCAP 93 124*       Disposition and Follow-up: Discharge Instructions    Diet - low sodium heart healthy   Complete by: As directed    Face-to-face encounter (required for Medicare/Medicaid patients)   Complete by: As directed    I Pegi Milazzo certify that this  patient is under my care and that I, or a nurse practitioner or physician's assistant working with me, had a face-to-face encounter that meets the physician face-to-face encounter requirements with this patient on 06/05/2020. The encounter with the patient was in whole, or in part for the following medical condition(s) which is the primary reason for home health care (List medical condition):87yo m,ale with CKD 4. health failure, afib, needs home health PT, RN   The encounter with the patient was in whole, or in part, for the following medical condition, which is the primary reason for home health care: 87yo m,ale with CKD 4. health failure, afib, needs home health PT, RN   I certify that, based on my findings, the following services are medically necessary home health services:  Nursing Physical therapy     Reason for Medically Necessary Home Health Services:  Skilled Nursing- Changes in Medication/Medication Management Therapy- Therapeutic Exercises to Increase Strength and Endurance     My clinical findings support the need for the above services: Unsafe ambulation due to balance issues   Further, I certify that my clinical findings support that this patient is homebound due to: Unable to leave home safely without assistance   Home Health   Complete by: As directed    To provide the following care/treatments:  PT OT     Increase activity slowly   Complete by: As directed        DISPOSITION: Home health PT OT, RN, aide   DISCHARGE FOLLOW-UP  Follow-up Information    [name], MD. Call in 1 week(s).   Specialties: Internal Medicine, Radiology Contact information: [place] [address] [phone]        Sherren Mocha, MD .   Specialty: Cardiology Contact information: [phone] [address] [place] [address] [phone]        Donato Heinz, MD. Schedule an appointment as soon as possible for a visit in 2 week(s).   Specialty:  Nephrology Contact information: [place] [address] [place], [place]  Oxygen Follow up.   Why: Oxygen to be delivered to the room prior to transition home.  Contact information: [place] [address] [phone]        Care, [facility] Follow up.   Specialty: Home Health Services Why: Registered Nurse, Physical Therapy, Occupational Therapy, Aide-office to call with visit times.  Contact information: [address] [place] [address] [phone]                Time coordinating discharge:  35 minutes Signed:   Estill Cotta M.D. [facility] 06/05/2020, 12:09 PM

## 2020-06-05 NOTE — Progress Notes (Signed)
RN noticed patients HR went down to 42. CCMD called and said patients HR went into 30s and was in second degree heart block, mobitz 2. Lasted for a few seconds. HR immediately went back up to the 70s. RN checked patient, paged MD, got vitals. EKG of different rhythm could not be captured.

## 2020-06-05 NOTE — Progress Notes (Signed)
Progress Note  Patient Name: Henry [name] Date of Encounter: 06/05/2020  [facility] Cardiologist: Sherren Mocha, MD   Subjective   Pt is comfortable , Lying in bed No significant changes.    Inpatient Medications    Scheduled Meds: . aspirin EC  81 mg Oral Daily  . [START ON 06/06/2020] calcitRIOL  0.25 mcg Oral Q Mon  . calcium acetate  667 mg Oral BID WC  . docusate sodium  100 mg Oral Daily  . ferrous gluconate  324 mg Oral Daily  . furosemide  40 mg Intravenous BID  . heparin  5,000 Units Subcutaneous Q8H  . insulin aspart  0-5 Units Subcutaneous QHS  . insulin aspart  0-9 Units Subcutaneous TID WC  . levothyroxine  75 mcg Oral Daily  . melatonin  6 mg Oral QHS  . mirtazapine  7.5 mg Oral QHS  . multivitamin  1 tablet Oral QHS  . sodium chloride flush  3 mL Intravenous Q12H  . Vitamin D (Ergocalciferol)  50,000 Units Oral Q Thu   Continuous Infusions: . sodium chloride     PRN Meds: sodium chloride, acetaminophen, diphenoxylate-atropine, meclizine, nitroGLYCERIN, ondansetron (ZOFRAN) IV, ondansetron, polyvinyl alcohol, sodium chloride flush   Vital Signs    Vitals:   06/05/20 0337 06/05/20 0828 06/05/20 0830 06/05/20 1110  BP: (!) 146/76 (!) 141/80  139/80  Pulse: 79 83  84  Resp: 16 16  18   Temp: 97.6 F (36.4 C) 97.7 F (36.5 C)  97.6 F (36.4 C)  TempSrc: Oral Oral  Oral  SpO2: 99% 100% 97% 100%  Weight:      Height:        Intake/Output Summary (Last 24 hours) at 06/05/2020 1141 Last data filed at 06/05/2020 0828 Gross per 24 hour  Intake 566 ml  Output 1000 ml  Net -434 ml   Last 3 Weights 06/05/2020 06/04/2020 06/03/2020  Weight (lbs) 127 lb 12.8 oz 131 lb 13.4 oz 128 lb 8.5 oz  Weight (kg) 57.97 kg 59.8 kg 58.3 kg      Telemetry    NSR . Sinus bradycardia - Personally Reviewed  ECG    None today - Personally Reviewed  Physical Exam   Physical Exam: Blood pressure 139/80, pulse 84, temperature 97.6 F (36.4 C), temperature  source Oral, resp. rate 18, height 5\' 9"  (1.753 m), weight 58 kg, SpO2 100 %.  GEN:  Elderly , frail man,  NAD  HEENT: Normal NECK: No JVD; No carotid bruits LYMPHATICS: No lymphadenopathy CARDIAC: RR, 2/6 systolic murmur  RESPIRATORY:   Extensive rhonchi in both bases,  L > R, ABDOMEN: Soft, non-tender, non-distended MUSCULOSKELETAL:    SKIN: Warm and dry NEUROLOGIC:  Alert and oriented x 3    Labs    High Sensitivity Troponin:   Recent Labs  Lab 06/01/20 1952 06/01/20 2146  TROPONINIHS 391* 381*      Chemistry Recent Labs  Lab 06/01/20 1952 06/01/20 2243 06/03/20 0136 06/04/20 0308 06/05/20 0154  NA 134*   < > 135 135 134*  K 3.9   < > 3.6 3.4* 3.2*  CL 100   < > 98 97* 98  CO2 20*   < > 23 24 27   GLUCOSE 139*   < > 96 79 100*  BUN 94*   < > 91* 88* 82*  CREATININE 4.85*   < > 4.69* 4.68* 4.56*  CALCIUM 9.8   < > 9.5 9.2 9.0  PROT 7.2  --   --   --   --  ALBUMIN 3.7  --   --   --   --   AST 19  --   --   --   --   ALT 18  --   --   --   --   ALKPHOS 77  --   --   --   --   BILITOT 0.6  --   --   --   --   GFRNONAA 10*   < > 11* 11* 11*  ANIONGAP 14   < > 14 14 9    < > = values in this interval not displayed.     Hematology Recent Labs  Lab 06/01/20 1952 06/02/20 0242  WBC 5.7 5.8  RBC 3.87* 3.90*  HGB 11.0* 11.2*  HCT 34.0* 34.3*  MCV 87.9 87.9  MCH 28.4 28.7  MCHC 32.4 32.7  RDW 17.0* 16.9*  PLT 226 219    BNP Recent Labs  Lab 06/01/20 1953  BNP >4,500.0*    Lab Results  Component Value Date   TSH 7.92 (H) 05/20/2020     DDimer No results for input(s): DDIMER in the last 168 hours.   Radiology    No results found.  Cardiac Studies   None this admit  Patient Profile     87 y.o. male w/ hx CAD, AS, CHF was admitted 04/06 w/ CHF, worsening renal function.  Assessment & Plan    Acute on chronic CHF: Continue to diurese Has mod - severely reduced LV function with AS   Aspiration: Has already seen speech path      Aortic stenosis: he is not a candidate for TAVR   Cont supportive care       Eldora will sign off.   Medication Recommendations:  Cont current meds, supportive care  Other recommendations (labs, testing, etc):   Follow up as an outpatient:  With hospice, palliative care     Mertie Moores, MD  06/05/2020 11:41 AM    [address],  [address] [place], [place]  [address] Phone: [phone]; Fax: [phone]

## 2020-06-05 NOTE — Progress Notes (Signed)
Patient and daughter provided with discharge education and materials, verbalized understanding. IV access ad telemetry monitor removed. No issues noted. Patient discharged from unit with all belongings

## 2020-06-05 NOTE — Discharge Instructions (Signed)
Heart Failure, Diagnosis  Heart failure means that your heart is not able to pump blood in the right way. This makes it hard for your body to work well. Heart failure is usually a long-term (chronic) condition. You must take good care of yourself and follow your treatment plan from your doctor. What are the causes?  High blood pressure.  Buildup of cholesterol and fat in the arteries.  Heart attack. This injures the heart muscle.  Heart valves that do not open and close properly.  Damage of the heart muscle. This is also called cardiomyopathy.  Infection of the heart muscle. This is also called myocarditis.  Lung disease. What increases the risk?  Getting older. The risk of heart failure goes up as a person ages.  Being overweight.  Being male.  Use tobacco or nicotine products.  Abusing alcohol or drugs.  Having taken medicines that can damage the heart.  Having any of these conditions: ? Diabetes. ? Abnormal heart rhythms. ? Thyroid problems. ? Low blood counts (anemia).  Having a family history of heart failure. What are the signs or symptoms?  Shortness of breath.  Coughing.  Swelling of the feet, ankles, legs, or belly.  Losing or gaining weight for no reason.  Trouble breathing.  Waking from sleep because of the need to sit up and get more air.  Fast heartbeat.  Being very tired.  Feeling dizzy, or feeling like you may pass out (faint).  Having no desire to eat.  Feeling like you may vomit (nauseous).  Peeing (urinating) more at night.  Feeling confused. How is this treated? This condition may be treated with:  Medicines. These can be given to treat blood pressure and to make the heart muscles stronger.  Changes in your daily life. These may include: ? Eating a healthy diet. ? Staying at a healthy body weight. ? Quitting tobacco, alcohol, and drug use. ? Doing exercises. ? Participating in a cardiac rehabilitation program. This program  helps you improve your health through exercise, education, and counseling.  Surgery. Surgery can be done to open blocked valves, or to put devices in the heart, such as pacemakers.  A donor heart (heart transplant). You will receive a healthy heart from a donor. Follow these instructions at home:  Treat other conditions as told by your doctor. These may include high blood pressure, diabetes, thyroid disease, or abnormal heart rhythms.  Learn as much as you can about heart failure.  Get support as you need it.  Keep all follow-up visits. Summary  Heart failure means that your heart is not able to pump blood in the right way.  This condition is often caused by high blood pressure, heart attack, or damage of the heart muscle.  Symptoms of this condition include shortness of breath and swelling of the feet, ankles, legs, or belly. You may also feel very tired or feel like you may vomit.  You may be treated with medicines, surgery, or changes in your daily life.  Treat other health conditions as told by your doctor. This information is not intended to replace advice given to you by your health care provider. Make sure you discuss any questions you have with your health care provider. Document Revised: 09/05/2019 Document Reviewed: 09/05/2019 Elsevier Patient Education  2021 [place].   Heart Failure, Self-Care Heart failure is a serious condition. The following information explains things you need to do to take care of yourself at home. To help you stay as healthy as  possible, you may be asked to change your diet, take certain medicines, and make other changes in your life. Your doctor may also give you more specific instructions. If you have problems or questions, call your doctor. What are the risks? Having heart failure makes it more likely for you to have some problems. These problems can get worse if you do not take good care of yourself. Problems may include:  Damage to the  kidneys, liver, or lungs.  Malnutrition.  Abnormal heart rhythms.  Blood clotting problems that could cause a stroke. Supplies needed:  Scale for weighing yourself.  Blood pressure monitor.  Notebook.  Medicines. How to care for yourself when you have heart failure Medicines Take over-the-counter and prescription medicines only as told by your doctor. Take your medicines every day.  Do not stop taking your medicine unless your doctor tells you to do so.  Do not skip any medicines.  Get your prescriptions refilled before you run out of medicine. This is important.  Talk with your doctor if you cannot afford your medicines. Eating and drinking  Eat heart-healthy foods. Talk with a diet specialist (dietitian) to create an eating plan.  Limit salt (sodium) if told by your doctor. Ask your diet specialist to tell you which seasonings are healthy for your heart.  Cook in healthy ways instead of frying. Healthy ways of cooking include roasting, grilling, broiling, baking, poaching, steaming, and stir-frying.  Choose foods that: ? Have no trans fat. ? Are low in saturated fat and cholesterol.  Choose healthy foods, such as: ? Fresh or frozen fruits and vegetables. ? Fish. ? Low-fat (lean) meats. ? Legumes, such as beans, peas, and lentils. ? Fat-free or low-fat dairy products. ? Whole-grain foods. ? High-fiber foods.  Limit how much fluid you drink, if told by your doctor.   Alcohol use  Do not drink alcohol if: ? Your doctor tells you not to drink. ? Your heart was damaged by alcohol, or you have very bad heart failure. ? You are pregnant, may be pregnant, or are planning to become pregnant.  If you drink alcohol: ? Limit how much you have to:  0-1 drink a day for women.  0-2 drinks a day for men. ? Know how much alcohol is in your drink. In the [place] one drink equals one 12 oz bottle of beer (355 mL), one 5 oz glass of wine (148 mL), or one 1 oz glass of hard  liquor (44 mL). Lifestyle  Do not smoke or use any products that contain nicotine or tobacco. If you need help quitting, ask your doctor. ? Do not use nicotine gum or patches before talking to your doctor.  Do not use illegal drugs.  Lose weight if told by your doctor.  Do physical activity if told by your doctor. Talk to your doctor before you begin an exercise if: ? You are an older adult. ? You have very bad heart failure.  Learn to manage stress. If you need help, ask your doctor.  Get physical rehab (rehabilitation) to help you stay independent and to help with your quality of life.  Participate in a cardiac rehab program. This program helps you improve your health through exercise, education, and counseling.  Plan time to rest when you get tired.   Check weight and blood pressure  Weigh yourself every day. This will help you to know if fluid is building up in your body. ? Weigh yourself every morning after you pee (urinate)  and before you eat breakfast. ? Wear the same amount of clothing each time. ? Write down your daily weight. Give your record to your doctor.  Check and write down your blood pressure as told by your doctor.  Check your pulse as told by your doctor.   Dealing with very hot and very cold weather  If it is very hot: ? Avoid activities that take a lot of energy. ? Use air conditioning or fans, or find a cooler place. ? Avoid caffeine and alcohol. ? Wear clothing that is loose-fitting, lightweight, and light-colored.  If it is very cold: ? Avoid activities that take a lot of energy. ? Layer your clothes. ? Wear mittens or gloves, a hat, and a face covering when you go outside. ? Avoid alcohol. Follow these instructions at home:  Stay up to date with shots (vaccines). Get pneumococcal and flu (influenza) shots.  Keep all follow-up visits. Contact a doctor if:  You gain 2-3 lb (1-1.4 kg) in 24 hours or 5 lb (2.3 kg) in a week.  You have  increasing shortness of breath.  You cannot do your normal activities.  You get tired easily.  You cough a lot.  You do not feel like eating or feel like you may vomit (nauseous).  You have swelling in your hands, feet, ankles, or belly (abdomen).  You cannot sleep well because it is hard to breathe.  You feel like your heart is beating fast (palpitations).  You get dizzy when you stand up.  You feel depressed or sad. Get help right away if:  You have trouble breathing.  You or someone else notices a change in your behavior, such as having trouble staying awake.  You have chest pain or discomfort.  You pass out (faint). These symptoms may be an emergency. Get help right away. Call your local emergency services (911 in the [place]  Do not wait to see if the symptoms will go away.  Do not drive yourself to the hospital. Summary  Heart failure is a serious condition. To care for yourself, you may have to change your diet, take medicines, and make other lifestyle changes.  Take your medicines every day. Do not stop taking them unless your doctor tells you to do so.  Limit salt and eat heart-healthy foods.  Ask your doctor if you can drink alcohol. You may have to stop alcohol use if you have very bad heart failure.  Contact your doctor if you gain weight quickly or feel that your heart is beating too fast. Get help right away if you pass out or have chest pain or trouble breathing. This information is not intended to replace advice given to you by your health care provider. Make sure you discuss any questions you have with your health care provider. Document Revised: 09/05/2019 Document Reviewed: 09/05/2019 Elsevier Patient Education  2021 [place].   Acute Kidney Injury, Adult  Acute kidney injury is a sudden worsening of kidney function. The kidneys are organs that have several jobs. They filter the blood to remove waste products and extra fluid. They also  maintain a healthy balance of minerals and hormones in the body, which helps control blood pressure and keep bones strong. With this condition, your kidneys do not do their jobs as well as they should. This condition ranges from mild to severe. Over time, it may develop into long-lasting (chronic) kidney disease. Early detection and treatment may prevent acute kidney injury from developing into a chronic  condition. What are the causes? Common causes of this condition include:  A problem with blood flow to the kidneys. This may be caused by: ? Low blood pressure (hypotension) or shock. ? Blood loss. ? Heart and blood vessel (cardiovascular) disease. ? Severe burns. ? Liver disease.  Direct damage to the kidneys. This may be caused by: ? Certain medicines. ? A kidney infection. ? Poisoning. ? Being around or in contact with toxic substances. ? A surgical wound. ? A hard, direct hit to the kidney area.  A sudden blockage of urine flow. This may be caused by: ? Cancer. ? Kidney stones. ? An enlarged prostate in males. What increases the risk? You are more likely to develop this condition if you:  Are older than age 71.  Are male.  Are hospitalized, especially if you are in critical condition.  Have certain conditions, such as: ? Chronic kidney disease. ? Diabetes. ? Coronary artery disease and heart failure. ? Pulmonary disease. ? Chronic liver disease. What are the signs or symptoms? Symptoms of this condition may not be obvious until the condition becomes severe. Symptoms of this condition can include:  Tiredness (lethargy) or difficulty staying awake.  Nausea or vomiting.  Swelling (edema) of the face, legs, ankles, or feet.  Problems with urination, such as: ? Pain in the abdomen, or pain along the side of your stomach (flank). ? Producing little or no urine. ? Passing urine with a weak flow.  Muscle twitches and cramps, especially in the legs.  Confusion or  trouble concentrating.  Loss of appetite.  Fever. How is this diagnosed? Your health care provider can diagnose this condition based on your symptoms, medical history, and a physical exam.  You may also have other tests, such as:  Blood tests.  Urine tests.  Imaging tests.  A test in which a sample of tissue is removed from the kidneys to be examined under a microscope (kidney biopsy). How is this treated? Treatment for this condition depends on the cause and how severe the condition is. In mild cases, treatment may not be needed. The kidneys may heal on their own. In more severe cases, treatment will involve:  Treating the cause of the kidney injury. This may involve changing any medicines you are taking or adjusting your dosage.  Fluids. You may need specialized IV fluids to balance your body's needs.  Having a catheter placed to drain urine and prevent blockages.  Preventing problems from occurring. This may mean avoiding certain medicines or procedures that can cause further injury to the kidneys. In some cases, treatment may also require:  A procedure to remove toxic wastes from the body (dialysis or continuous renal replacement therapy, CRRT).  Surgery. This may be done to repair a torn kidney or to remove the blockage from the urinary system. Follow these instructions at home: Medicines  Take over-the-counter and prescription medicines only as told by your health care provider.  Do not take any new medicines without your health care provider's approval. Many medicines can worsen your kidney damage.  Do not take any vitamin and mineral supplements without your health care provider's approval. Many nutritional supplements can worsen your kidney damage. Lifestyle  If your health care provider prescribed changes to your diet, follow them. You may need to decrease the amount of protein you eat.  Achieve and maintain a healthy weight. If you need help with this, ask your  health care provider.  Start or continue an exercise plan. Try to  exercise at least 30 minutes a day, 5 days a week.  Do not use any products that contain nicotine or tobacco, such as cigarettes, e-cigarettes, and chewing tobacco. If you need help quitting, ask your health care provider.   General instructions  Keep track of your blood pressure. Report changes in your blood pressure as told by your health care provider.  Stay up to date with your vaccines. Ask your health care provider which vaccines you need.  Keep all follow-up visits as told by your health care provider. This is important.   Where to find more information  American Association of Kidney Patients: BombTimer.gl  National Kidney Foundation: www.kidney.[place]: https://mathis.com/  [facility]: ? www.lifeoptions.org ? www.kidneyschool.org Contact a health care provider if:  Your symptoms get worse.  You develop new symptoms. Get help right away if:  You develop symptoms of worsening kidney disease, which include: ? Headaches. ? Abnormally dark or light skin. ? Easy bruising. ? Frequent hiccups. ? Chest pain. ? Shortness of breath. ? End of menstruation in women. ? Seizures. ? Confusion or altered mental status. ? Abdominal or back pain. ? Itchiness.  You have a fever.  Your body is producing less urine.  You have pain or bleeding when you urinate. Summary  Acute kidney injury is a sudden worsening of kidney function.  Acute kidney injury can be caused by problems with blood flow to the kidneys, direct damage to the kidneys, and sudden blockage of urine flow.  Symptoms of this condition may not be obvious until it becomes severe. Symptoms may include edema, lethargy, confusion, nausea or vomiting, and problems passing urine.  This condition can be diagnosed with blood tests, urine tests, and imaging tests. Sometimes a kidney biopsy is done to diagnose this  condition.  Treatment for this condition often involves treating the underlying cause. It is treated with fluids, medicines, diet changes, dialysis, or surgery. This information is not intended to replace advice given to you by your health care provider. Make sure you discuss any questions you have with your health care provider. Document Revised: 12/23/2018 Document Reviewed: 12/23/2018 Elsevier Patient Education  2021 [employer].

## 2020-06-16 ENCOUNTER — Telehealth: Payer: Self-pay | Admitting: Internal Medicine

## 2020-06-16 NOTE — Telephone Encounter (Signed)
Ok for lasix 40 mg bid x 2 days, then return to 40 mg daily  thanks

## 2020-06-16 NOTE — Telephone Encounter (Signed)
    Henry [name] calling to report patient had a 2.5 weight gain overnight She would like to know if Lasix dosage should be increased tonight   Please call

## 2020-06-17 NOTE — Telephone Encounter (Signed)
Notified Henry [name] she states that she gave two lasix yesterday one being at night. Weight began at 124.6 to 119 overnight.

## 2020-06-22 ENCOUNTER — Telehealth: Payer: Self-pay | Admitting: Nurse Practitioner

## 2020-06-22 DIAGNOSIS — H353211 Exudative age-related macular degeneration, right eye, with active choroidal neovascularization: Secondary | ICD-10-CM | POA: Diagnosis not present

## 2020-06-22 DIAGNOSIS — I447 Left bundle-branch block, unspecified: Secondary | ICD-10-CM | POA: Diagnosis not present

## 2020-06-22 DIAGNOSIS — R55 Syncope and collapse: Secondary | ICD-10-CM | POA: Diagnosis not present

## 2020-06-22 DIAGNOSIS — W19XXXA Unspecified fall, initial encounter: Secondary | ICD-10-CM | POA: Diagnosis not present

## 2020-06-23 ENCOUNTER — Other Ambulatory Visit: Payer: Self-pay

## 2020-06-23 ENCOUNTER — Other Ambulatory Visit: Payer: Medicare Other | Admitting: Nurse Practitioner

## 2020-06-23 ENCOUNTER — Telehealth: Payer: Self-pay | Admitting: Internal Medicine

## 2020-06-23 DIAGNOSIS — Z515 Encounter for palliative care: Secondary | ICD-10-CM

## 2020-06-23 DIAGNOSIS — I5022 Chronic systolic (congestive) heart failure: Secondary | ICD-10-CM

## 2020-06-23 DIAGNOSIS — R110 Nausea: Secondary | ICD-10-CM

## 2020-06-23 NOTE — Progress Notes (Signed)
[facility] Consult Note Telephone: [phone]  Fax: [phone]    Date of encounter: 06/23/20 PATIENT NAME: Henry [name] [address] [place] [address]   [phone] (home) [phone] (work) DOB: [birth year] MRN: [id number]   PRIMARY CARE PROVIDER:    [name], MD,  [place] [address] [phone]  REFERRING PROVIDER:   [name], MD [address] [place],  [place] [address] [phone]  RESPONSIBLE PARTY:    Contact Information    Name Relation Home Work Cleone Daughter [phone]  [phone]   Lowella Petties Daughter   [phone]    Ardis Hughs (daughter) [phone]  I met face to face with patient and family in home. Daughter [name] present at visit.  Palliative Care was asked to follow this patient by consultation request of  [name], MD to address advance care planning and complex medical decision making. This is a follow up visit.                                  ASSESSMENT AND PLAN / RECOMMENDATIONS:   Advance Care Planning/Goals of Care: Goals include to maximize quality of life and symptom management. Our advance care planning conversation included a discussion about:     Decision to de-escalate disease focused treatments due to poor prognosis, patient desires strictly comfort care. Patient and his family request for Hospice care services as patient does not desire anymore hospitalization, he verbalized desire to expire in his home.  CODE STATUS: DNR Directives: Signed DNR and MOST form present in home, copy on [facility] Epic EMR.  Symptom Management/Plan: Nausea: improved, continue Zofran 57m by mouth every 8hrs as needed. Encourage patient to sit up when eating. Consider eating bland food and gradually progress to regular meals. Weakness: Ongoing and worsening with patient having a near fall episode yesterday when he  fell on his walker while trying to walk to the bathroom. EMS was called, patient declined transport to hospital for evaluation. Continue to offer assistance with ADLs as needed. Maintain skin integrity, prevent falls. Pain: Use Tylenol 5026mby mouth as needed for soreness related to the fall. Dyspnea with minimal activity: ongoing and worsening. On continuous oxygen at 2L, unable to obtain pulse oximetry today due to poor circulation with bilateral fingers cyanotic.  Provided general support and encouragement. Questions and concerns were addressed. Patient and family was encouraged to call with questions and/or concerns.  Visit update discussed over the phone with patient's daughter [name], she verbalized agreement with plan.  I spent 60 minutes providing this consultation. More than 50% of the time in this consultation was spent in counseling and care coordination.  PPS: 30%  HOSPICE ELIGIBILITY/DIAGNOSIS: Yes/End stage renal disease, CHF (EF 30-35%). Verbal order for Hospice services received from Dr. JaCathlean CowerCHIEF COMPLAIN: Nausea  History obtained from review of Epic EMR, discussion with patient and his family.   HISTORY OF PRESENT ILLNESS:Henry J Marsicanois a [age].o.year old malewith multiple medical problems including, combined systolic and diastolic CHF (EF 3066-06% severe aortic stenosis(not candidate for TAVR),CAD s/p CABG, PAD, HTN, HLD, DM type 2, CKD V (refused dialysis).  Patient daughter MaLeda Gauzealled this NP yesterday reporting patient with complaint of nausea with 2 episodes of vomiting. Report nausea not aggravated by anything, and associated with poor oral intake. Patient was said to not  have used anything to relieve the symptom. No report of abdominal pain, no report of bloating or chest pain. Today, patient endorsed nausea relieved with prescribed Zofran without any vomiting since last night. Patient and family report ongoing progressive weakness, with a near  fall episode yesterday. He was hospitalized earlier this month from 06/01/2020 to 06/05/2020 for HF exacerbation, was discharged on home oxygen. He denied fever, denied chills, denied increased SOB at rest. Ten systems reviewed and are negative for acute change, except as noted in the HPI.   I reviewed available labs, medications, imaging, studies and related documents from the EMR.  Records reviewed and summarized above.   Physical Exam: Vital signs: 128/70, HR 64, RR 18 Constitutional: NAD General:chronically ill and  frail appearing, thin EYES: anicteric sclera, no discharge  ENMT: intact hearing, oral mucous membranes moist CV: +1 BLE edema Pulmonary: no increased work of breathing noted at rest, no cough, no oxygen at 2L Abdomen:  no ascites, right upper flank tender to touch GU: deferred MSK: sarcopenia, moves all extremities Skin: warm and dry, no rashes or wounds on visible skin, bilateral fingers and toes cyanotic Neuro:  generalized weakness, no cognitive impairment Psych: non-anxious affect, A and O x 4 Hem/lymph/immuno: no widespread bruising   Past Medical History:  Diagnosis Date  . Anemia   . CAD of autologous bypass graft   . Carotid artery disease ([place])   . Diabetes mellitus without complication ([place])   . DJD (degenerative joint disease)   . History of anemia of chronic disease   . Hypercholesteremia   . Hypertension   . NSTEMI (non-ST elevated myocardial infarction) ([facility])   . PVD (peripheral vascular disease) with claudication ([place])   . Renal insufficiency   . Wears dentures   . Wears glasses    Current Outpatient Medications on File Prior to Visit  Medication Sig Dispense Refill  . acetaminophen (TYLENOL) 500 MG tablet Take 1,000 mg by mouth every 6 (six) hours as needed for moderate pain.    Marland Kitchen aspirin EC 81 MG tablet Take 81 mg by mouth daily.     Marland Kitchen b complex-vitamin c-folic acid (NEPHRO-VITE) 0.8 MG TABS tablet Take 1 tablet by mouth at bedtime.    .  calcitRIOL (ROCALTROL) 0.25 MCG capsule Take 0.25 mcg by mouth once a week. Monday    . calcium acetate (PHOSLO) 667 MG capsule Take 667 mg by mouth 2 (two) times daily with a meal.    . Darbepoetin Alfa (ARANESP) 100 MCG/0.5ML SOSY injection Inject 100 mcg into the skin every 30 (thirty) days.     . diphenoxylate-atropine (LOMOTIL) 2.5-0.025 MG tablet Take 1 tablet by mouth 4 (four) times daily as needed for diarrhea or loose stools. 30 tablet 0  . docusate sodium (COLACE) 100 MG capsule Take 100 mg by mouth daily.    . ergocalciferol (VITAMIN D2) 1.25 MG (50000 UT) capsule Take 50,000 Units by mouth once a week.    . ferrous gluconate (FERGON) 324 MG tablet Take 324 mg by mouth daily.    . furosemide (LASIX) 40 MG tablet Take 1 tablet (40 mg total) by mouth daily. 90 tablet 3  . glucose blood (ACCU-CHEK AVIVA PLUS) test strip Use as instructed daily E11.9 100 each 12  . Lancets (ACCU-CHEK MULTICLIX) lancets USE TWICE DAILY. 102 each 5  . levothyroxine (SYNTHROID) 75 MCG tablet Take 1 tablet (75 mcg total) by mouth daily. 90 tablet 3  . meclizine (ANTIVERT) 25 MG tablet Take 25 mg  by mouth daily as needed for dizziness.    . melatonin 3 MG TABS tablet Take 6 mg by mouth at bedtime.    . mirtazapine (REMERON) 7.5 MG tablet Take 1 tablet (7.5 mg total) by mouth at bedtime. 30 tablet 1  . Multiple Vitamins-Minerals (PRESERVISION AREDS 2 PO) Take 1 tablet by mouth in the morning and at bedtime.    . nitroGLYCERIN (NITROSTAT) 0.4 MG SL tablet Place 1 tablet (0.4 mg total) under the tongue every 5 (five) minutes as needed for chest pain. 50 tablet 3  . ondansetron (ZOFRAN) 4 MG tablet Take 4 mg by mouth every 8 (eight) hours as needed for nausea or vomiting.    Marland Kitchen Propylene Glycol (SYSTANE COMPLETE OP) Place 1 drop into both eyes daily as needed (dry eyes).     No current facility-administered medications on file prior to visit.   Thank you for the opportunity to participate in the care of Mr.  Kavanagh.  The palliative care team will continue to follow. Please call our office at [phone] if we can be of additional assistance.   Jari Favre, DNP, AGPCNP-BC  COVID-19 PATIENT SCREENING TOOL Asked and negative response unless otherwise noted:   Have you had symptoms of covid, tested positive or been in contact with someone with symptoms/positive test in the past 5-10 days?

## 2020-06-23 NOTE — Telephone Encounter (Signed)
Spoke with NP for palliative care  Pt with weakness, n/v x 2 yesterday and near fall  Pt is requesting hospice referral   - I will do

## 2020-06-27 ENCOUNTER — Ambulatory Visit: Payer: Medicare Other | Admitting: Internal Medicine

## 2020-06-27 DIAGNOSIS — H04123 Dry eye syndrome of bilateral lacrimal glands: Secondary | ICD-10-CM | POA: Diagnosis not present

## 2020-06-27 DIAGNOSIS — E039 Hypothyroidism, unspecified: Secondary | ICD-10-CM | POA: Diagnosis not present

## 2020-06-27 DIAGNOSIS — I350 Nonrheumatic aortic (valve) stenosis: Secondary | ICD-10-CM | POA: Diagnosis not present

## 2020-06-27 DIAGNOSIS — R55 Syncope and collapse: Secondary | ICD-10-CM | POA: Diagnosis not present

## 2020-06-27 DIAGNOSIS — I2510 Atherosclerotic heart disease of native coronary artery without angina pectoris: Secondary | ICD-10-CM | POA: Diagnosis not present

## 2020-06-27 DIAGNOSIS — E785 Hyperlipidemia, unspecified: Secondary | ICD-10-CM | POA: Diagnosis not present

## 2020-06-27 DIAGNOSIS — I132 Hypertensive heart and chronic kidney disease with heart failure and with stage 5 chronic kidney disease, or end stage renal disease: Secondary | ICD-10-CM | POA: Diagnosis not present

## 2020-06-27 DIAGNOSIS — N185 Chronic kidney disease, stage 5: Secondary | ICD-10-CM | POA: Diagnosis not present

## 2020-06-27 DIAGNOSIS — R630 Anorexia: Secondary | ICD-10-CM | POA: Diagnosis not present

## 2020-06-27 DIAGNOSIS — D631 Anemia in chronic kidney disease: Secondary | ICD-10-CM | POA: Diagnosis not present

## 2020-06-27 DIAGNOSIS — I5020 Unspecified systolic (congestive) heart failure: Secondary | ICD-10-CM | POA: Diagnosis not present

## 2020-06-28 DIAGNOSIS — I350 Nonrheumatic aortic (valve) stenosis: Secondary | ICD-10-CM | POA: Diagnosis not present

## 2020-06-28 DIAGNOSIS — N185 Chronic kidney disease, stage 5: Secondary | ICD-10-CM | POA: Diagnosis not present

## 2020-06-28 DIAGNOSIS — D631 Anemia in chronic kidney disease: Secondary | ICD-10-CM | POA: Diagnosis not present

## 2020-06-28 DIAGNOSIS — R630 Anorexia: Secondary | ICD-10-CM | POA: Diagnosis not present

## 2020-06-28 DIAGNOSIS — I132 Hypertensive heart and chronic kidney disease with heart failure and with stage 5 chronic kidney disease, or end stage renal disease: Secondary | ICD-10-CM | POA: Diagnosis not present

## 2020-06-28 DIAGNOSIS — I5020 Unspecified systolic (congestive) heart failure: Secondary | ICD-10-CM | POA: Diagnosis not present

## 2020-06-29 DIAGNOSIS — I132 Hypertensive heart and chronic kidney disease with heart failure and with stage 5 chronic kidney disease, or end stage renal disease: Secondary | ICD-10-CM | POA: Diagnosis not present

## 2020-06-29 DIAGNOSIS — D631 Anemia in chronic kidney disease: Secondary | ICD-10-CM | POA: Diagnosis not present

## 2020-06-29 DIAGNOSIS — R630 Anorexia: Secondary | ICD-10-CM | POA: Diagnosis not present

## 2020-06-29 DIAGNOSIS — I350 Nonrheumatic aortic (valve) stenosis: Secondary | ICD-10-CM | POA: Diagnosis not present

## 2020-06-29 DIAGNOSIS — I5020 Unspecified systolic (congestive) heart failure: Secondary | ICD-10-CM | POA: Diagnosis not present

## 2020-06-29 DIAGNOSIS — N185 Chronic kidney disease, stage 5: Secondary | ICD-10-CM | POA: Diagnosis not present

## 2020-07-04 ENCOUNTER — Telehealth: Payer: Self-pay | Admitting: Internal Medicine

## 2020-07-04 NOTE — Telephone Encounter (Signed)
Ok done

## 2020-07-04 NOTE — Telephone Encounter (Addendum)
    Please sign death certification using electronic system Date of death 06/30/20

## 2020-07-21 ENCOUNTER — Ambulatory Visit: Payer: Medicare Other | Admitting: Cardiovascular Disease

## 2020-07-27 DEATH — deceased

## 2020-08-10 ENCOUNTER — Ambulatory Visit: Payer: Medicare Other | Admitting: Internal Medicine

## 2020-09-12 IMAGING — US US RENAL
1 series · 14 of 25 positions shown · non-contrast
Comparison: CT, 05/10/2017

CLINICAL DATA: Acute kidney failure.

EXAM:
RENAL / URINARY TRACT ULTRASOUND COMPLETE

[Series 1: us renal · 14 of 46 slices shown]
[im 1/46]
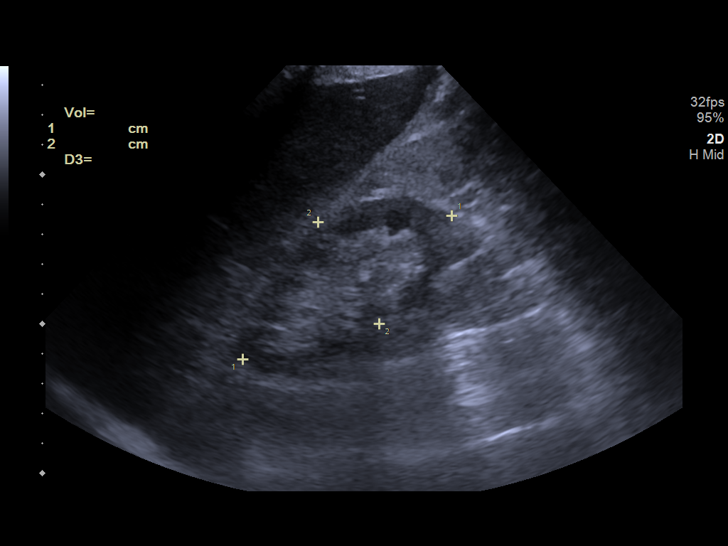
[im 4/46]
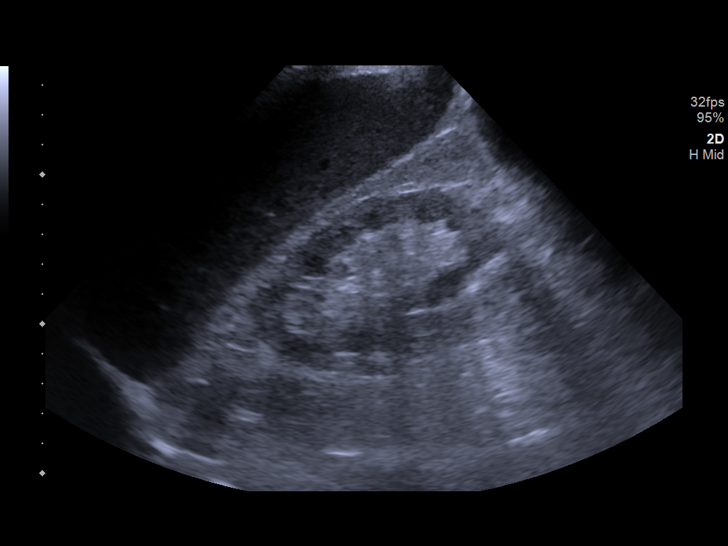
[im 8/46]
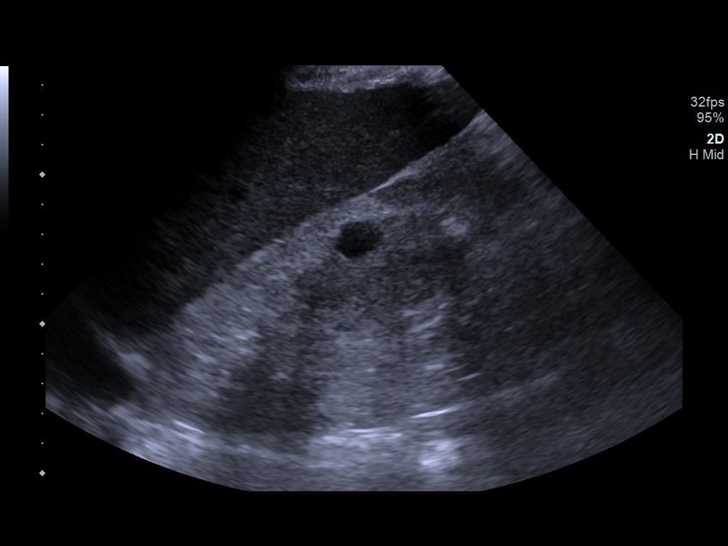
[im 12/46]
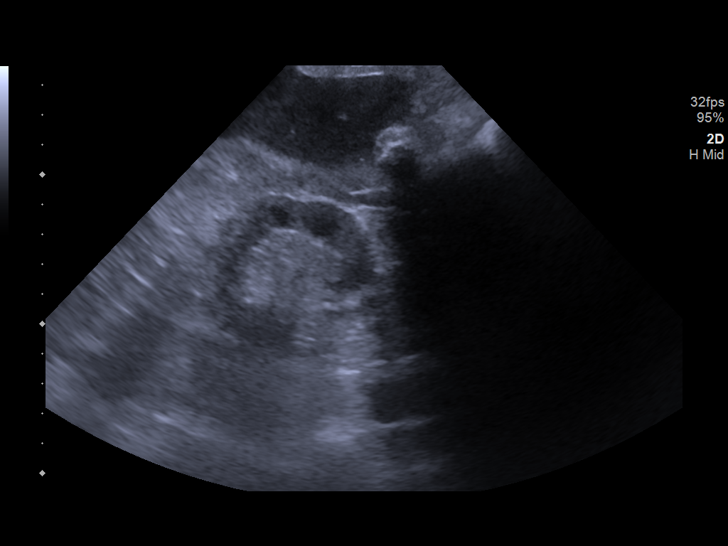
[im 16/46]
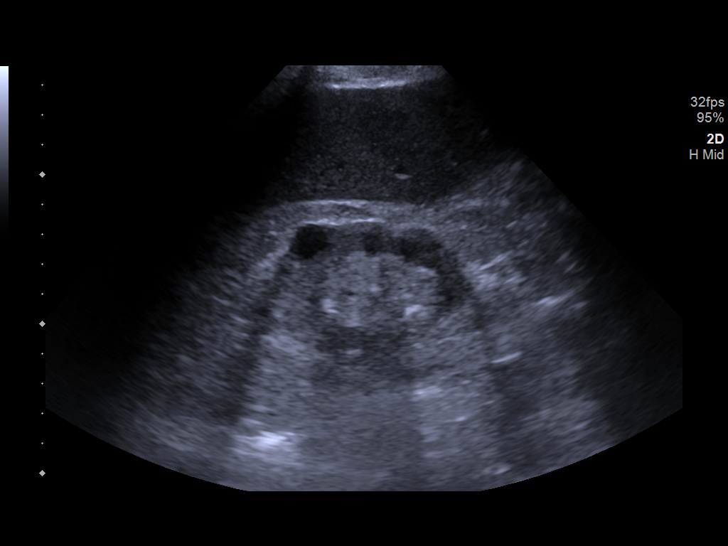
[im 17/46]
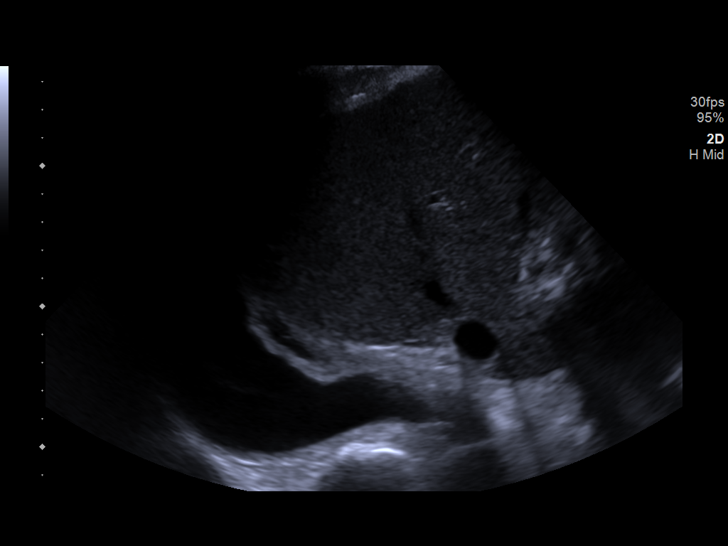
[im 21/46]
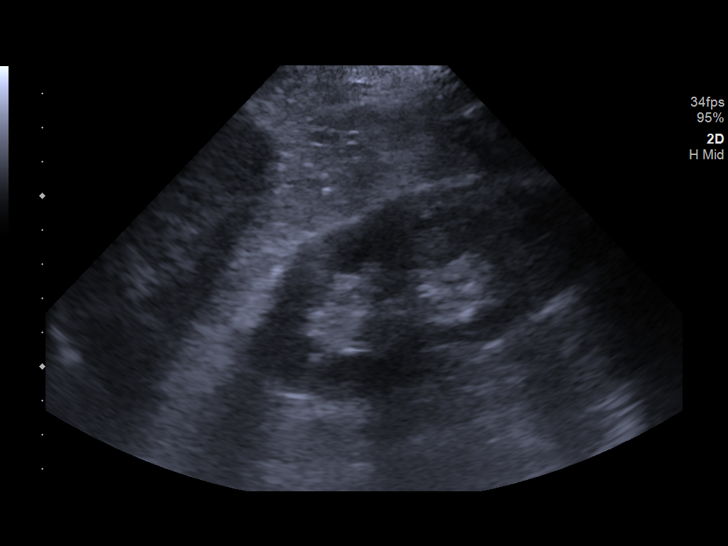
[im 25/46]
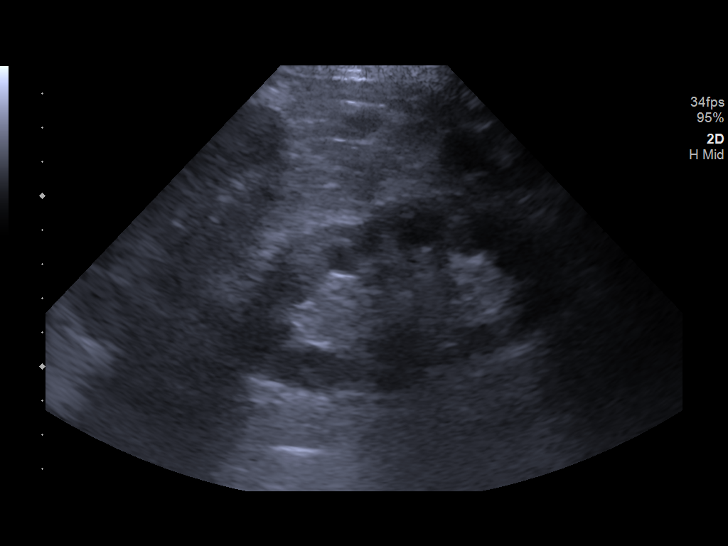
[im 29/46]
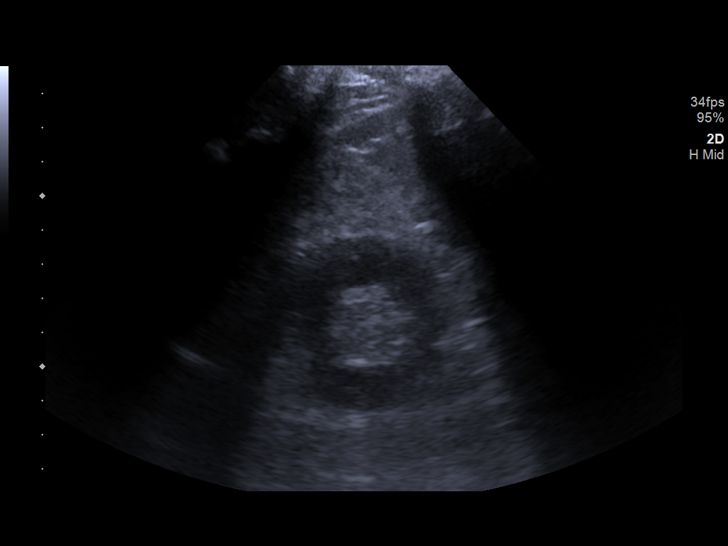
[im 31/46]
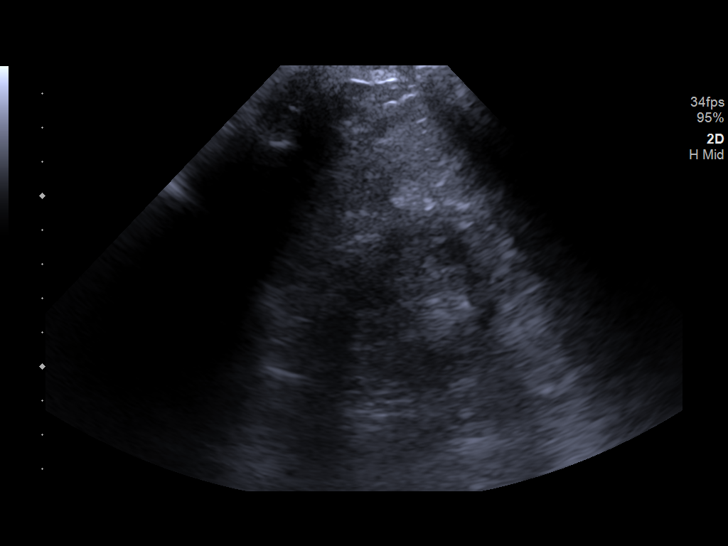
[im 34/46]
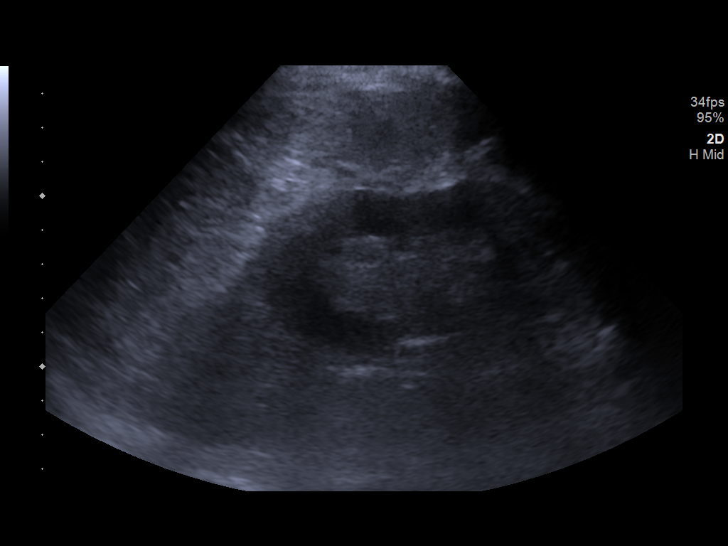
[im 38/46]
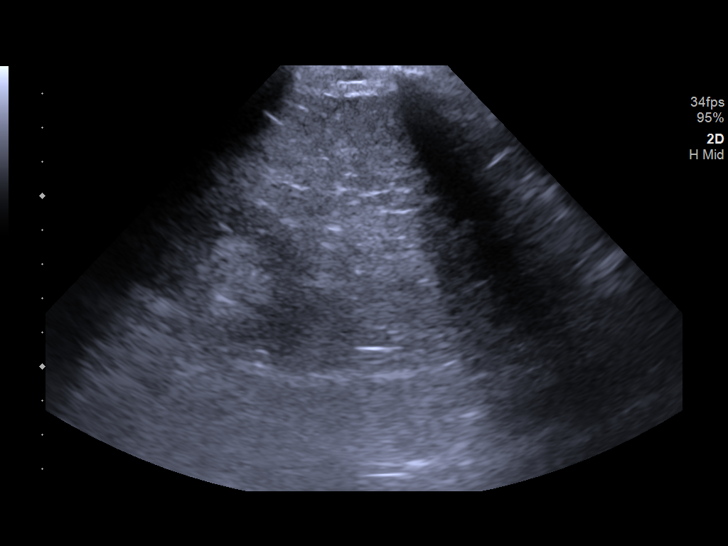
[im 42/46]
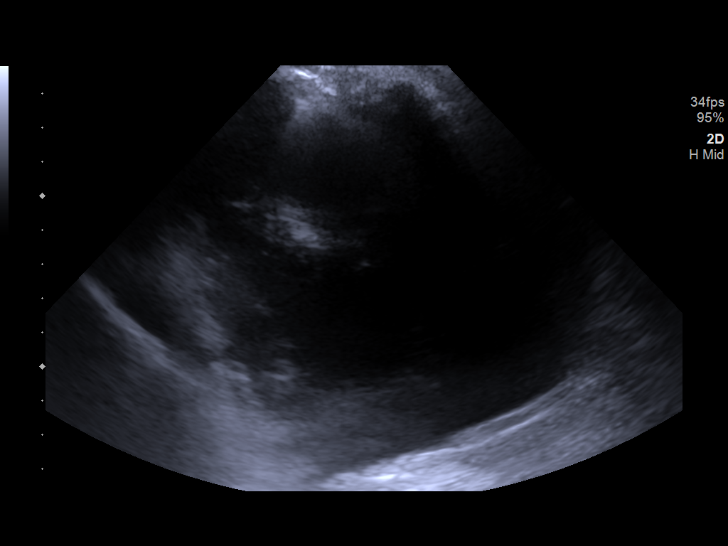
[im 46/46]
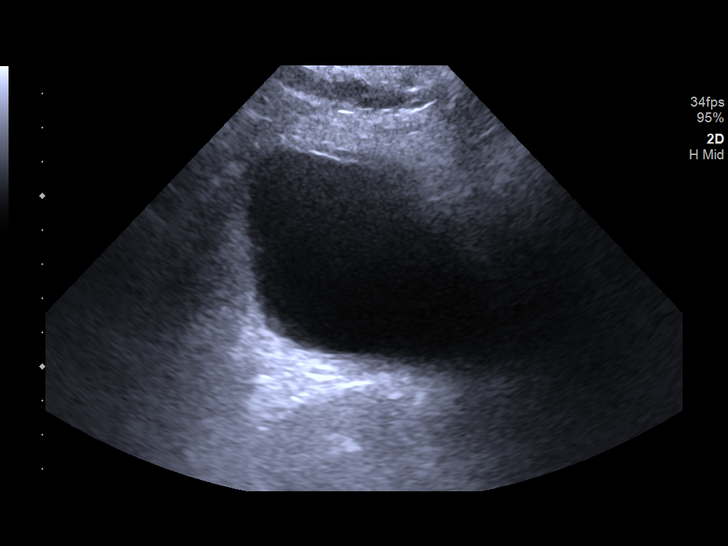

[14 of 25 positions shown; findings below may reference images not displayed]

FINDINGS: Right Kidney:

Renal measurements: 8.4 x 3.9 x 4.6 cm = volume: 81 mL. Borderline
increased parenchymal echogenicity. Mild diffuse renal cortical
thinning. Mid to upper pole cyst measuring 1.4 x 1.2 x 1.3 cm. No
other masses, no stones and no hydronephrosis.

Left Kidney:

Renal measurements: 10.7 x 5 x 5.5 cm = volume: 156 mL. Echogenicity
within normal limits. No mass or hydronephrosis visualized.

Bladder:

Appears normal for degree of bladder distention.

Incidental note of bilateral pleural effusions.
IMPRESSION: 1. No acute findings.  No hydronephrosis.
2. Borderline increased right renal parenchymal echogenicity with
renal cortical thinning, and a relative decrease in the size of the
right kidney compared to the left, consistent with medical renal
disease.
3. 14 mm right renal cyst.
4. Bilateral pleural effusions.

## 2020-09-13 IMAGING — NM NM PULMONARY VENT & PERF
15 series · 15 of 15 positions shown · non-contrast
Comparison: None.

CLINICAL DATA: Worsening short of breath. Chest discomfort.
Pulmonary edema.

EXAM:
NUCLEAR MEDICINE VENTILATION - PERFUSION LUNG SCAN
TECHNIQUE: Ventilation images were obtained in multiple projections using
inhaled aerosol 6c-IIm DTPA. Perfusion images were obtained in
multiple projections after intravenous injection of 6c-IIm MAA.
RADIOPHARMACEUTICALS:  Thirty-one mCi of 6c-IIm DTPA aerosol
inhalation and 4.3 mCi QcFFm MAA IV

[Series 1: ant/post vent · 4.14mm/px · 1 of 1 slices shown (1 of 2)]
[im 1/1]
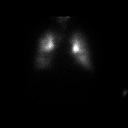

[Series 1: ant/post vent · 4.14mm/px · 1 of 1 slices shown (2 of 2)]
[im 1/1]
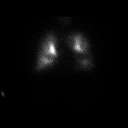

[Series 2: lao/rpo vent · 4.14mm/px · 1 of 1 slices shown]
[im 1/1]
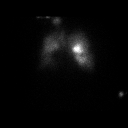

[Series 3: lpo/rao vent · 4.14mm/px · 1 of 1 slices shown (1 of 2)]
[im 1/1]
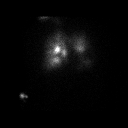

[Series 3: lpo/rao vent · 4.14mm/px · 1 of 1 slices shown (2 of 2)]
[im 1/1]
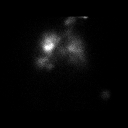

[Series 4: lt lat/rt lat vent · 4.14mm/px · 1 of 1 slices shown (1 of 2)]
[im 1/1]
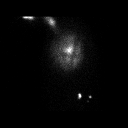

[Series 4: lt lat/rt lat vent · 4.14mm/px · 1 of 1 slices shown (2 of 2)]
[im 1/1]
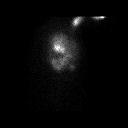

[Series 5: lt lat/rt lat perf · 4.14mm/px · 1 of 1 slices shown (1 of 2)]
[im 1/1]
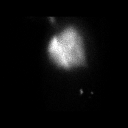

[Series 5: lt lat/rt lat perf · 4.14mm/px · 1 of 1 slices shown (2 of 2)]
[im 1/1]
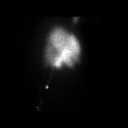

[Series 6: lpo/rao perf · 4.14mm/px · 1 of 1 slices shown (1 of 2)]
[im 1/1]
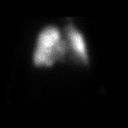

[Series 6: lpo/rao perf · 4.14mm/px · 1 of 1 slices shown (2 of 2)]
[im 1/1]
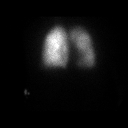

[Series 7: ant/post perf · 4.14mm/px · 1 of 1 slices shown (1 of 2)]
[im 1/1]
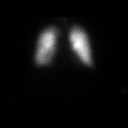

[Series 7: ant/post perf · 4.14mm/px · 1 of 1 slices shown (2 of 2)]
[im 1/1]
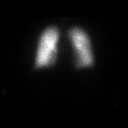

[Series 8: lao/rpo perf · 4.14mm/px · 1 of 1 slices shown (1 of 2)]
[im 1/1]
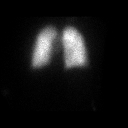

[Series 8: lao/rpo perf · 4.14mm/px · 1 of 1 slices shown (2 of 2)]
[im 1/1]
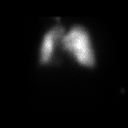

[15 of 15 positions shown; findings below may reference images not displayed]

FINDINGS: Ventilation: No focal ventilation defect.

Perfusion: No wedge shaped peripheral perfusion defects to suggest
acute pulmonary embolism.
IMPRESSION: No evidence acute pulmonary embolism.

## 2020-09-19 ENCOUNTER — Ambulatory Visit: Payer: Medicare Other | Admitting: Cardiovascular Disease

## 2022-05-01 IMAGING — DX DG CHEST 1V PORT
1 series · 1 of 1 positions shown · non-contrast
Comparison: 11/09/2019

CLINICAL DATA: Cough

EXAM:
PORTABLE CHEST 1 VIEW

[chest]
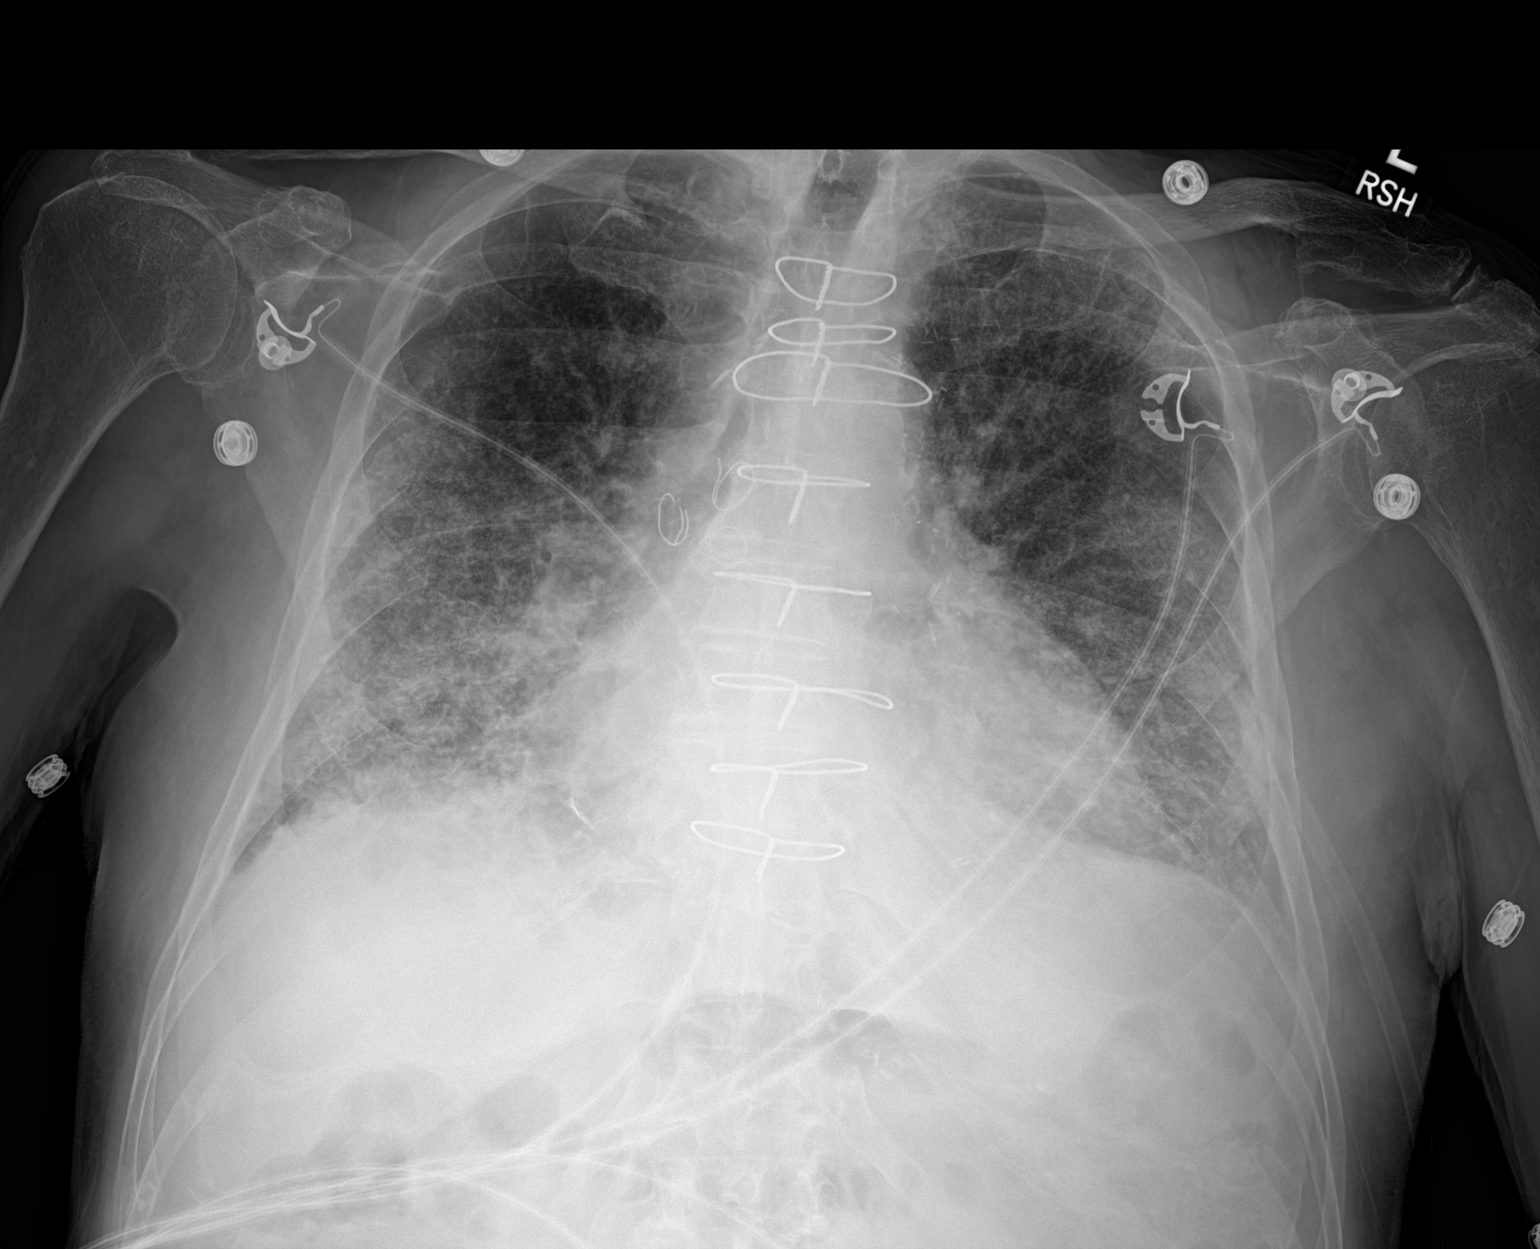

[1 of 1 positions shown; findings below may reference images not displayed]

FINDINGS: Bilateral airspace opacities again noted in the mid and lower lung
zones. No significant change since prior study. Heart is borderline
in size. Prior CABG. No effusions or pneumothorax.
IMPRESSION: Bilateral airspace opacities, not significantly changed since prior
study.
